# Patient Record
Sex: Female | Born: 1950 | Race: White | Hispanic: No | Marital: Married | Smoking: Never smoker
Health system: Southern US, Community
[De-identification: ages and names within clinical notes are randomized; demographics above are authoritative.]

## PROBLEM LIST (undated history)

## (undated) DIAGNOSIS — Z8719 Personal history of other diseases of the digestive system: Secondary | ICD-10-CM

## (undated) DIAGNOSIS — K219 Gastro-esophageal reflux disease without esophagitis: Secondary | ICD-10-CM

## (undated) DIAGNOSIS — F419 Anxiety disorder, unspecified: Secondary | ICD-10-CM

## (undated) DIAGNOSIS — E785 Hyperlipidemia, unspecified: Secondary | ICD-10-CM

## (undated) DIAGNOSIS — M2550 Pain in unspecified joint: Secondary | ICD-10-CM

## (undated) DIAGNOSIS — N993 Prolapse of vaginal vault after hysterectomy: Secondary | ICD-10-CM

## (undated) DIAGNOSIS — K5900 Constipation, unspecified: Secondary | ICD-10-CM

## (undated) DIAGNOSIS — M8580 Other specified disorders of bone density and structure, unspecified site: Secondary | ICD-10-CM

## (undated) DIAGNOSIS — I471 Supraventricular tachycardia, unspecified: Secondary | ICD-10-CM

## (undated) DIAGNOSIS — M1990 Unspecified osteoarthritis, unspecified site: Secondary | ICD-10-CM

## (undated) DIAGNOSIS — N3281 Overactive bladder: Secondary | ICD-10-CM

## (undated) DIAGNOSIS — E782 Mixed hyperlipidemia: Secondary | ICD-10-CM

## (undated) DIAGNOSIS — N393 Stress incontinence (female) (male): Secondary | ICD-10-CM

## (undated) DIAGNOSIS — K449 Diaphragmatic hernia without obstruction or gangrene: Secondary | ICD-10-CM

## (undated) DIAGNOSIS — I10 Essential (primary) hypertension: Secondary | ICD-10-CM

## (undated) DIAGNOSIS — I219 Acute myocardial infarction, unspecified: Secondary | ICD-10-CM

## (undated) DIAGNOSIS — G8929 Other chronic pain: Secondary | ICD-10-CM

## (undated) DIAGNOSIS — D3132 Benign neoplasm of left choroid: Secondary | ICD-10-CM

## (undated) DIAGNOSIS — K5909 Other constipation: Secondary | ICD-10-CM

## (undated) DIAGNOSIS — Z860100 Personal history of colon polyps, unspecified: Secondary | ICD-10-CM

## (undated) DIAGNOSIS — Z860101 Personal history of adenomatous and serrated colon polyps: Secondary | ICD-10-CM

## (undated) DIAGNOSIS — K760 Fatty (change of) liver, not elsewhere classified: Secondary | ICD-10-CM

## (undated) DIAGNOSIS — I2510 Atherosclerotic heart disease of native coronary artery without angina pectoris: Secondary | ICD-10-CM

## (undated) DIAGNOSIS — C6992 Malignant neoplasm of unspecified site of left eye: Secondary | ICD-10-CM

## (undated) DIAGNOSIS — M549 Dorsalgia, unspecified: Secondary | ICD-10-CM

## (undated) DIAGNOSIS — I499 Cardiac arrhythmia, unspecified: Secondary | ICD-10-CM

## (undated) DIAGNOSIS — M810 Age-related osteoporosis without current pathological fracture: Secondary | ICD-10-CM

## (undated) DIAGNOSIS — R0602 Shortness of breath: Secondary | ICD-10-CM

## (undated) HISTORY — DX: Unspecified osteoarthritis, unspecified site: M19.90

## (undated) HISTORY — DX: Age-related osteoporosis without current pathological fracture: M81.0

## (undated) HISTORY — DX: Fatty (change of) liver, not elsewhere classified: K76.0

## (undated) HISTORY — PX: ANKLE SURGERY: SHX546

## (undated) HISTORY — PX: CATARACT EXTRACTION W/ INTRAOCULAR LENS IMPLANT: SHX1309

## (undated) HISTORY — PX: ABDOMINAL HYSTERECTOMY: SHX81

## (undated) HISTORY — DX: Essential (primary) hypertension: I10

## (undated) HISTORY — PX: BREAST BIOPSY: SHX20

## (undated) HISTORY — PX: CARPAL TUNNEL RELEASE: SHX101

## (undated) HISTORY — DX: Gastro-esophageal reflux disease without esophagitis: K21.9

## (undated) HISTORY — DX: Hyperlipidemia, unspecified: E78.5

## (undated) HISTORY — DX: Anxiety disorder, unspecified: F41.9

## (undated) HISTORY — PX: POLYPECTOMY: SHX149

## (undated) HISTORY — DX: Personal history of colonic polyps: Z86.010

## (undated) HISTORY — PX: CHOLECYSTECTOMY: SHX55

## (undated) HISTORY — DX: Benign neoplasm of left choroid: D31.32

## (undated) HISTORY — DX: Dorsalgia, unspecified: M54.9

## (undated) HISTORY — DX: Malignant neoplasm of unspecified site of left eye: C69.92

## (undated) HISTORY — DX: Shortness of breath: R06.02

## (undated) HISTORY — DX: Pain in unspecified joint: M25.50

## (undated) HISTORY — PX: VARICOSE VEIN SURGERY: SHX832

## (undated) HISTORY — DX: Constipation, unspecified: K59.00

## (undated) HISTORY — DX: Personal history of colon polyps, unspecified: Z86.0100

## (undated) HISTORY — PX: REVERSE SHOULDER ARTHROPLASTY: SHX5054

---

## 1994-10-22 HISTORY — PX: LAPAROSCOPIC CHOLECYSTECTOMY: SUR755

## 1998-04-19 ENCOUNTER — Ambulatory Visit (HOSPITAL_BASED_OUTPATIENT_CLINIC_OR_DEPARTMENT_OTHER): Admission: RE | Admit: 1998-04-19 | Discharge: 1998-04-19 | Payer: Self-pay | Admitting: Orthopedic Surgery

## 1998-05-19 ENCOUNTER — Other Ambulatory Visit: Admission: RE | Admit: 1998-05-19 | Discharge: 1998-05-19 | Payer: Self-pay | Admitting: Obstetrics and Gynecology

## 1998-06-22 ENCOUNTER — Other Ambulatory Visit: Admission: RE | Admit: 1998-06-22 | Discharge: 1998-06-22 | Payer: Self-pay | Admitting: Radiology

## 1998-07-05 ENCOUNTER — Other Ambulatory Visit: Admission: RE | Admit: 1998-07-05 | Discharge: 1998-07-05 | Payer: Self-pay | Admitting: Radiology

## 1998-10-22 HISTORY — PX: ABDOMINAL HYSTERECTOMY: SHX81

## 1999-03-02 ENCOUNTER — Other Ambulatory Visit: Admission: RE | Admit: 1999-03-02 | Discharge: 1999-03-02 | Payer: Self-pay | Admitting: Obstetrics and Gynecology

## 1999-04-19 ENCOUNTER — Encounter: Payer: Self-pay | Admitting: Obstetrics and Gynecology

## 1999-04-19 ENCOUNTER — Encounter (INDEPENDENT_AMBULATORY_CARE_PROVIDER_SITE_OTHER): Payer: Self-pay | Admitting: Specialist

## 1999-04-19 ENCOUNTER — Inpatient Hospital Stay (HOSPITAL_COMMUNITY): Admission: RE | Admit: 1999-04-19 | Discharge: 1999-04-22 | Payer: Self-pay | Admitting: Obstetrics and Gynecology

## 2001-04-01 ENCOUNTER — Ambulatory Visit (HOSPITAL_BASED_OUTPATIENT_CLINIC_OR_DEPARTMENT_OTHER): Admission: RE | Admit: 2001-04-01 | Discharge: 2001-04-01 | Payer: Self-pay | Admitting: Orthopedic Surgery

## 2001-04-01 HISTORY — PX: CARPAL TUNNEL RELEASE: SHX101

## 2001-05-19 ENCOUNTER — Other Ambulatory Visit: Admission: RE | Admit: 2001-05-19 | Discharge: 2001-05-19 | Payer: Self-pay | Admitting: Obstetrics and Gynecology

## 2003-06-14 ENCOUNTER — Other Ambulatory Visit: Admission: RE | Admit: 2003-06-14 | Discharge: 2003-06-14 | Payer: Self-pay | Admitting: Obstetrics and Gynecology

## 2003-07-02 ENCOUNTER — Encounter: Payer: Self-pay | Admitting: Orthopedic Surgery

## 2003-07-02 ENCOUNTER — Encounter: Admission: RE | Admit: 2003-07-02 | Discharge: 2003-07-02 | Payer: Self-pay | Admitting: Orthopedic Surgery

## 2003-07-05 ENCOUNTER — Ambulatory Visit (HOSPITAL_BASED_OUTPATIENT_CLINIC_OR_DEPARTMENT_OTHER): Admission: RE | Admit: 2003-07-05 | Discharge: 2003-07-05 | Payer: Self-pay | Admitting: Orthopedic Surgery

## 2003-07-05 HISTORY — PX: ANKLE SURGERY: SHX546

## 2004-01-31 ENCOUNTER — Ambulatory Visit (HOSPITAL_COMMUNITY): Admission: RE | Admit: 2004-01-31 | Discharge: 2004-01-31 | Payer: Self-pay | Admitting: *Deleted

## 2004-01-31 ENCOUNTER — Encounter (INDEPENDENT_AMBULATORY_CARE_PROVIDER_SITE_OTHER): Payer: Self-pay | Admitting: Specialist

## 2004-07-17 ENCOUNTER — Encounter (INDEPENDENT_AMBULATORY_CARE_PROVIDER_SITE_OTHER): Payer: Self-pay | Admitting: *Deleted

## 2004-07-17 ENCOUNTER — Ambulatory Visit (HOSPITAL_COMMUNITY): Admission: RE | Admit: 2004-07-17 | Discharge: 2004-07-17 | Payer: Self-pay | Admitting: *Deleted

## 2005-01-04 ENCOUNTER — Ambulatory Visit: Payer: Self-pay | Admitting: Cardiovascular Disease

## 2005-01-30 ENCOUNTER — Ambulatory Visit: Payer: Self-pay

## 2005-02-06 ENCOUNTER — Ambulatory Visit: Payer: Self-pay | Admitting: Pulmonary Disease

## 2005-02-20 ENCOUNTER — Ambulatory Visit: Payer: Self-pay | Admitting: Cardiovascular Disease

## 2005-03-06 ENCOUNTER — Ambulatory Visit: Payer: Self-pay | Admitting: Cardiovascular Disease

## 2005-03-14 ENCOUNTER — Ambulatory Visit: Payer: Self-pay | Admitting: Cardiovascular Disease

## 2005-09-20 ENCOUNTER — Ambulatory Visit: Payer: Self-pay | Admitting: Cardiovascular Disease

## 2006-03-22 ENCOUNTER — Ambulatory Visit: Payer: Self-pay | Admitting: Cardiovascular Disease

## 2006-08-12 ENCOUNTER — Ambulatory Visit (HOSPITAL_COMMUNITY): Admission: RE | Admit: 2006-08-12 | Discharge: 2006-08-12 | Payer: Self-pay | Admitting: *Deleted

## 2007-03-24 ENCOUNTER — Ambulatory Visit: Payer: Self-pay | Admitting: Cardiovascular Disease

## 2008-03-29 ENCOUNTER — Ambulatory Visit: Payer: Self-pay | Admitting: Cardiovascular Disease

## 2008-09-27 ENCOUNTER — Encounter: Payer: Self-pay | Admitting: Family Medicine

## 2008-11-24 ENCOUNTER — Ambulatory Visit: Payer: Self-pay | Admitting: Family Medicine

## 2008-11-24 DIAGNOSIS — J309 Allergic rhinitis, unspecified: Secondary | ICD-10-CM | POA: Insufficient documentation

## 2008-11-24 DIAGNOSIS — R011 Cardiac murmur, unspecified: Secondary | ICD-10-CM | POA: Insufficient documentation

## 2008-11-24 DIAGNOSIS — M1990 Unspecified osteoarthritis, unspecified site: Secondary | ICD-10-CM | POA: Insufficient documentation

## 2008-11-24 DIAGNOSIS — E785 Hyperlipidemia, unspecified: Secondary | ICD-10-CM | POA: Insufficient documentation

## 2008-11-24 DIAGNOSIS — J45909 Unspecified asthma, uncomplicated: Secondary | ICD-10-CM | POA: Insufficient documentation

## 2008-11-24 DIAGNOSIS — I10 Essential (primary) hypertension: Secondary | ICD-10-CM | POA: Insufficient documentation

## 2008-12-17 ENCOUNTER — Encounter: Payer: Self-pay | Admitting: Family Medicine

## 2009-01-12 ENCOUNTER — Ambulatory Visit: Payer: Self-pay | Admitting: Family Medicine

## 2009-01-12 LAB — CONVERTED CEMR LAB
Bilirubin Urine: NEGATIVE
Glucose, Urine, Semiquant: NEGATIVE
Ketones, urine, test strip: NEGATIVE
Nitrite: NEGATIVE
Protein, U semiquant: NEGATIVE
Specific Gravity, Urine: 1.01
Urobilinogen, UA: 0.2
WBC Urine, dipstick: NEGATIVE
pH: 7

## 2009-01-14 LAB — CONVERTED CEMR LAB
ALT: 20 units/L (ref 0–35)
AST: 22 units/L (ref 0–37)
Albumin: 4.1 g/dL (ref 3.5–5.2)
Alkaline Phosphatase: 43 units/L (ref 39–117)
BUN: 11 mg/dL (ref 6–23)
Basophils Absolute: 0 10*3/uL (ref 0.0–0.1)
Basophils Relative: 1.1 % (ref 0.0–3.0)
Bilirubin, Direct: 0 mg/dL (ref 0.0–0.3)
CO2: 30 meq/L (ref 19–32)
Calcium: 9.4 mg/dL (ref 8.4–10.5)
Chloride: 103 meq/L (ref 96–112)
Cholesterol: 177 mg/dL (ref 0–200)
Creatinine, Ser: 0.6 mg/dL (ref 0.4–1.2)
Eosinophils Absolute: 0.2 10*3/uL (ref 0.0–0.7)
Eosinophils Relative: 4.4 % (ref 0.0–5.0)
GFR calc non Af Amer: 108.87 mL/min (ref >60)
Glucose, Bld: 91 mg/dL (ref 70–99)
HCT: 36.7 % (ref 36.0–46.0)
HDL: 28.8 mg/dL — ABNORMAL LOW (ref >39.00)
Hemoglobin: 13 g/dL (ref 12.0–15.0)
LDL Cholesterol: 120 mg/dL — ABNORMAL HIGH (ref 0–99)
Lymphocytes Relative: 37.6 % (ref 12.0–46.0)
Lymphs Abs: 1.6 10*3/uL (ref 0.7–4.0)
MCHC: 35.3 g/dL (ref 30.0–36.0)
MCV: 86.8 fL (ref 78.0–100.0)
Monocytes Absolute: 0.3 10*3/uL (ref 0.1–1.0)
Monocytes Relative: 7.2 % (ref 3.0–12.0)
Neutro Abs: 2.2 10*3/uL (ref 1.4–7.7)
Neutrophils Relative %: 49.7 % (ref 43.0–77.0)
Platelets: 194 10*3/uL (ref 150.0–400.0)
Potassium: 3.9 meq/L (ref 3.5–5.1)
RBC: 4.23 M/uL (ref 3.87–5.11)
RDW: 12.7 % (ref 11.5–14.6)
Sodium: 139 meq/L (ref 135–145)
TSH: 1.09 microintl units/mL (ref 0.35–5.50)
Total Bilirubin: 0.8 mg/dL (ref 0.3–1.2)
Total CHOL/HDL Ratio: 6
Total Protein: 7.7 g/dL (ref 6.0–8.3)
Triglycerides: 143 mg/dL (ref 0.0–149.0)
VLDL: 28.6 mg/dL (ref 0.0–40.0)
WBC: 4.3 10*3/uL — ABNORMAL LOW (ref 4.5–10.5)

## 2009-01-19 ENCOUNTER — Encounter: Payer: Self-pay | Admitting: Cardiovascular Disease

## 2009-01-19 ENCOUNTER — Ambulatory Visit: Payer: Self-pay | Admitting: Family Medicine

## 2009-03-18 DIAGNOSIS — D126 Benign neoplasm of colon, unspecified: Secondary | ICD-10-CM | POA: Insufficient documentation

## 2009-03-18 DIAGNOSIS — B019 Varicella without complication: Secondary | ICD-10-CM | POA: Insufficient documentation

## 2009-03-18 DIAGNOSIS — I872 Venous insufficiency (chronic) (peripheral): Secondary | ICD-10-CM | POA: Insufficient documentation

## 2009-03-28 ENCOUNTER — Ambulatory Visit: Payer: Self-pay | Admitting: Cardiovascular Disease

## 2009-08-05 ENCOUNTER — Ambulatory Visit: Payer: Self-pay | Admitting: Family Medicine

## 2009-08-05 DIAGNOSIS — M545 Low back pain, unspecified: Secondary | ICD-10-CM | POA: Insufficient documentation

## 2009-08-19 ENCOUNTER — Encounter: Payer: Self-pay | Admitting: Family Medicine

## 2009-10-17 ENCOUNTER — Encounter: Payer: Self-pay | Admitting: Family Medicine

## 2009-11-04 ENCOUNTER — Encounter: Payer: Self-pay | Admitting: Family Medicine

## 2009-12-16 ENCOUNTER — Ambulatory Visit: Payer: Self-pay | Admitting: Family Medicine

## 2009-12-16 DIAGNOSIS — J019 Acute sinusitis, unspecified: Secondary | ICD-10-CM | POA: Insufficient documentation

## 2009-12-28 ENCOUNTER — Encounter: Payer: Self-pay | Admitting: Family Medicine

## 2010-01-17 ENCOUNTER — Ambulatory Visit: Payer: Self-pay | Admitting: Family Medicine

## 2010-01-17 LAB — CONVERTED CEMR LAB
Bilirubin Urine: NEGATIVE
Glucose, Urine, Semiquant: NEGATIVE
Ketones, urine, test strip: NEGATIVE
Nitrite: NEGATIVE
Protein, U semiquant: NEGATIVE
Specific Gravity, Urine: 1.01
Urobilinogen, UA: 0.2
WBC Urine, dipstick: NEGATIVE
pH: 7

## 2010-01-18 LAB — CONVERTED CEMR LAB
ALT: 24 units/L (ref 0–35)
AST: 22 units/L (ref 0–37)
Albumin: 4 g/dL (ref 3.5–5.2)
Alkaline Phosphatase: 41 units/L (ref 39–117)
BUN: 15 mg/dL (ref 6–23)
Basophils Absolute: 0 10*3/uL (ref 0.0–0.1)
Basophils Relative: 0.1 % (ref 0.0–3.0)
Bilirubin, Direct: 0 mg/dL (ref 0.0–0.3)
CO2: 31 meq/L (ref 19–32)
Calcium: 9.2 mg/dL (ref 8.4–10.5)
Chloride: 101 meq/L (ref 96–112)
Cholesterol: 169 mg/dL (ref 0–200)
Creatinine, Ser: 0.7 mg/dL (ref 0.4–1.2)
Eosinophils Absolute: 0.2 10*3/uL (ref 0.0–0.7)
Eosinophils Relative: 5 % (ref 0.0–5.0)
GFR calc non Af Amer: 90.81 mL/min (ref >60)
Glucose, Bld: 86 mg/dL (ref 70–99)
HCT: 37.8 % (ref 36.0–46.0)
HDL: 32.6 mg/dL — ABNORMAL LOW (ref >39.00)
Hemoglobin: 12.7 g/dL (ref 12.0–15.0)
LDL Cholesterol: 103 mg/dL — ABNORMAL HIGH (ref 0–99)
Lymphocytes Relative: 34.2 % (ref 12.0–46.0)
Lymphs Abs: 1.3 10*3/uL (ref 0.7–4.0)
MCHC: 33.7 g/dL (ref 30.0–36.0)
MCV: 89.9 fL (ref 78.0–100.0)
Monocytes Absolute: 0.3 10*3/uL (ref 0.1–1.0)
Monocytes Relative: 8.5 % (ref 3.0–12.0)
Neutro Abs: 2.1 10*3/uL (ref 1.4–7.7)
Neutrophils Relative %: 52.2 % (ref 43.0–77.0)
Platelets: 164 10*3/uL (ref 150.0–400.0)
Potassium: 4 meq/L (ref 3.5–5.1)
RBC: 4.2 M/uL (ref 3.87–5.11)
RDW: 12.1 % (ref 11.5–14.6)
Sodium: 138 meq/L (ref 135–145)
TSH: 2 microintl units/mL (ref 0.35–5.50)
Total Bilirubin: 0.7 mg/dL (ref 0.3–1.2)
Total CHOL/HDL Ratio: 5
Total Protein: 7.6 g/dL (ref 6.0–8.3)
Triglycerides: 168 mg/dL — ABNORMAL HIGH (ref 0.0–149.0)
VLDL: 33.6 mg/dL (ref 0.0–40.0)
WBC: 3.9 10*3/uL — ABNORMAL LOW (ref 4.5–10.5)

## 2010-01-20 ENCOUNTER — Ambulatory Visit: Payer: Self-pay | Admitting: Family Medicine

## 2010-04-12 ENCOUNTER — Ambulatory Visit: Payer: Self-pay | Admitting: Family Medicine

## 2010-07-19 ENCOUNTER — Ambulatory Visit: Payer: Self-pay | Admitting: Family Medicine

## 2010-07-25 ENCOUNTER — Ambulatory Visit: Payer: Self-pay | Admitting: Family Medicine

## 2010-11-21 NOTE — Assessment & Plan Note (Signed)
Summary: shingles shot   Nurse Visit   Allergies: 1)  ! Codeine  Immunizations Administered:  Zostavax # 1:    Vaccine Type: Zostavax    Site: right deltoid    Mfr: Merck    Dose: 0.5 ml    Route: [place]    Given by: Raechel Ache, RN    Exp. Date: 05/17/2011    Lot #: [id number]    VIS given: 08/03/05 given April 12, 2010.  Orders Added: 1)  Zoster (Shingles) Vaccine Live [90736] 2)  Admin 1st Vaccine [phone]

## 2010-11-21 NOTE — Miscellaneous (Signed)
Summary: Waiver of Liability for Shingles Vaccine  Waiver of Liability for Shingles Vaccine   Imported By: Maryln Gottron 04/13/2010 11:16:25  _____________________________________________________________________  External Attachment:    Type:   Image     Comment:   External Document

## 2010-11-21 NOTE — Assessment & Plan Note (Signed)
Summary: SINUSITIS? // RS   Vital Signs:  Patient profile:   60 year old female Weight:      211 pounds O2 Sat:      97 % Temp:     98.8 degrees F Pulse rate:   76 / minute BP sitting:   140 / 84  (left arm) Cuff size:   large  Vitals Entered By: Pura Spice, RN (July 25, 2010 11:43 AM) CC: sinus infection since last week   History of Present Illness: Here for one week of sinus pressure, HA, PND, and low fevers. No ST or cough.   Allergies: 1)  ! Codeine  Past History:  Past Medical History: Reviewed history from 01/20/2010 and no changes required. 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 Floyde Parkins for GYN exams normal DEXA on 12-28-09  Past Surgical History: Reviewed history from 08/05/2009 and no changes required. Breast Biopsy Carpal tunnel release Cholecystectomy Hysterectomy laser ablation of leg varicosities per Dr. Ardyth Gal colonoscopy 10-07 per Dr. Virginia Rochester, repeat in 5 yrs has had Synvisc injections to the knees per Dr. Lequita Halt 2008  Review of Systems  The patient denies anorexia, weight loss, weight gain, vision loss, decreased hearing, hoarseness, chest pain, syncope, dyspnea on exertion, peripheral edema, prolonged cough, hemoptysis, abdominal pain, melena, hematochezia, severe indigestion/heartburn, hematuria, incontinence, genital sores, muscle weakness, suspicious skin lesions, transient blindness, difficulty walking, depression, unusual weight change, abnormal bleeding, enlarged lymph nodes, angioedema, breast masses, and testicular masses.    Physical Exam  General:  Well-developed,well-nourished,in no acute distress; alert,appropriate and cooperative throughout examination Head:   Normocephalic and atraumatic without obvious abnormalities. No apparent alopecia or balding. Eyes:  No corneal or conjunctival inflammation noted. EOMI. Perrla. Funduscopic exam benign, without hemorrhages, exudates or papilledema. Vision grossly normal. Ears:  External ear exam shows no significant lesions or deformities.  Otoscopic examination reveals clear canals, tympanic membranes are intact bilaterally without bulging, retraction, inflammation or discharge. Hearing is grossly normal bilaterally. Nose:  External nasal examination shows no deformity or inflammation. Nasal mucosa are pink and moist without lesions or exudates. Mouth:  Oral mucosa and oropharynx without lesions or exudates.  Teeth in good repair. Neck:  No deformities, masses, or tenderness noted. Lungs:  Normal respiratory effort, chest expands symmetrically. Lungs are clear to auscultation, no crackles or wheezes.   Impression & Recommendations:  Problem # 1:  ACUTE SINUSITIS, UNSPECIFIED (ICD-461.9)  Her updated medication list for this problem includes:    Nasacort Aq 55 Mcg/act Aers (Triamcinolone acetonide(nasal)) .Marland Kitchen... 2 sprays each nostril once daily    Augmentin 875-125 Mg Tabs (Amoxicillin-pot clavulanate) .Marland Kitchen..Marland Kitchen Two times a day  Orders: Depo- Medrol 80mg  (J1040) Admin of Therapeutic Inj  intramuscular or subcutaneous (47829) Depo- Medrol 40mg  (J1030)  Complete Medication List: 1)  Niaspan 500 Mg Tbcr (Niacin (antihyperlipidemic)) .... Once daily 2)  Cardizem 120 Mg Tabs (Diltiazem hcl) .... Once daily 3)  Vitamin C 500 Mg Tabs (Ascorbic acid) .Marland Kitchen.. 1 by mouth once daily 4)  Vitamin E 600 Unit Caps (Vitamin e) .Marland Kitchen.. 1 by mouth once daily 5)  Multivitamins Tabs (Multiple vitamin) .... Once daily 6)  Calcium 1500 Mg Tabs (Calcium carbonate) .... Once daily 7)  Glucosamine-msm 500-400 Mg Caps (Glucosamine sulfate-msm) .... Once daily 8)  Crestor 10 Mg Tabs (Rosuvastatin calcium) .... Once daily 9)  Altace 5  Mg  Caps (Ramipril) .... Once daily 10)  Nasacort Aq 55 Mcg/act Aers (Triamcinolone acetonide(nasal)) .... 2 sprays each nostril once daily 11)  Fish Oil Oil (Fish oil) .... 2 by mouth once daily 12)  Vicodin 5-500 Mg Tabs (Hydrocodone-acetaminophen) .Marland Kitchen.. 1 q 4 hours as needed pain 13)  Zyrtec Allergy 10 Mg Caps (Cetirizine hcl) .... One by mouth daily 14)  Ventolin Hfa 108 (90 Base) Mcg/act Aers (Albuterol sulfate) .... 2 puffs q 4 hours as needed 15)  Augmentin 875-125 Mg Tabs (Amoxicillin-pot clavulanate) .... Two times a day  Patient Instructions: 1)  Please schedule a follow-up appointment as needed .  Prescriptions: AUGMENTIN 875-125 MG TABS (AMOXICILLIN-POT CLAVULANATE) two times a day  #20 x 0   Entered and Authorized by:   Nelwyn Salisbury MD   Signed by:   Nelwyn Salisbury MD on 07/25/2010   Method used:   [facility] to        [facility]  [address] [phone]* (retail)       [address] [place], [place]  [address]       Ph: [phone] or [phone]       Fax: [phone]   RxID:   [id number]    Medication Administration  Injection # 1:    Medication: Depo- Medrol 80mg     Diagnosis: ACUTE SINUSITIS, UNSPECIFIED (ICD-461.9)    Route: IM    Site: RUOQ gluteus    Exp Date: 01/2013    Lot #: [id number]    Mfr: [employer]    Comments: 01/2013    Patient tolerated injection without complications    Given by: Pura Spice, RN (July 25, 2010 12:25 PM)  Injection # 2:    Medication: Depo- Medrol 40mg     Diagnosis: ACUTE SINUSITIS, UNSPECIFIED (ICD-461.9)    Route: IM    Site: RUOQ gluteus    Exp Date: 01/2013    Lot #: [id number]    Given by: Pura Spice, RN (July 25, 2010 12:26 PM)  Orders Added: 1)  Est. Patient Level IV [97673] 2)  Depo- Medrol 80mg  [J1040] 3)  Admin of Therapeutic Inj  intramuscular or subcutaneous [96372] 4)  Depo- Medrol 40mg  [J1030]

## 2010-11-21 NOTE — Assessment & Plan Note (Signed)
Summary: flu shot//ccm  Nurse Visit   Review of Systems       Flu Vaccine Consent Questions     Do you have a history of severe allergic reactions to this vaccine? no    Any prior history of allergic reactions to egg and/or gelatin? no    Do you have a sensitivity to the preservative Thimersol? no    Do you have a past history of Guillan-Barre Syndrome? no    Do you currently have an acute febrile illness? no    Have you ever had a severe reaction to latex? no    Vaccine information given and explained to patient? yes    Are you currently pregnant? no    Lot Number:[id number]   Exp Date:04/21/2011   Site Given  Left Deltoid IM    Allergies: 1)  ! Codeine  Orders Added: 1)  Admin 1st Vaccine [90471] 2)  Flu Vaccine 27yrs + [16109]

## 2010-11-21 NOTE — Assessment & Plan Note (Signed)
Summary: cpx/njr   Vital Signs:  Patient profile:   60 year old female Height:      64.5 inches Weight:      198 pounds BMI:     33.58 O2 Sat:      92 % Temp:     98.4 degrees F oral Pulse rate:   63 / minute Pulse rhythm:   regular Resp:     12 per minute BP sitting:   128 / 72  Vitals Entered By: Lynann Beaver CMA (January 20, 2010 9:05 AM) CC: cpx Is Patient Diabetic? No Pain Assessment Patient in pain? no        History of Present Illness: 60 yr old female for a cpx. She feels fine and has no complaints. She asks about a shingles shot.   Allergies: 1)  ! 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 Floyde Parkins for GYN exams normal DEXA on 12-28-09  Past Surgical History: Reviewed history from 08/05/2009 and no changes required. Breast Biopsy Carpal tunnel release Cholecystectomy Hysterectomy laser ablation of leg varicosities per Dr. Ardyth Gal colonoscopy 10-07 per Dr. Virginia Rochester, repeat in 5 yrs has had Synvisc injections to the knees per Dr. Lequita Halt 2008  Family History: Reviewed history from 11/24/2008 and no changes required. Family History Breast cancer 1st degree relative <50 Family History of CAD Female 1st degree relative <50 Family History of Colon CA 1st degree relative <60 Family History Diabetes 1st degree relative Family History High cholesterol Family History Hypertension Family History Kidney disease Family History Lung cancer  Social History: Reviewed history from 11/24/2008 and no changes required. Married Never Smoked Alcohol use-no Drug use-no Regular exercise-no  Review of Systems  The patient denies anorexia, fever, weight loss,  weight gain, vision loss, decreased hearing, hoarseness, chest pain, syncope, dyspnea on exertion, peripheral edema, prolonged cough, headaches, hemoptysis, abdominal pain, melena, hematochezia, severe indigestion/heartburn, hematuria, incontinence, genital sores, muscle weakness, suspicious skin lesions, transient blindness, difficulty walking, depression, unusual weight change, abnormal bleeding, enlarged lymph nodes, angioedema, breast masses, and testicular masses.    Physical Exam  General:  Well-developed,well-nourished,in no acute distress; alert,appropriate and cooperative throughout examination Head:  Normocephalic and atraumatic without obvious abnormalities. No apparent alopecia or balding. Eyes:  No corneal or conjunctival inflammation noted. EOMI. Perrla. Funduscopic exam benign, without hemorrhages, exudates or papilledema. Vision grossly normal. Ears:  External ear exam shows no significant lesions or deformities.  Otoscopic examination reveals clear canals, tympanic membranes are intact bilaterally without bulging, retraction, inflammation or discharge. Hearing is grossly normal bilaterally. Nose:  External nasal examination shows no deformity or inflammation. Nasal mucosa are pink and moist without lesions or exudates. Mouth:  Oral mucosa and oropharynx without lesions or exudates.  Teeth in good repair. Neck:  No deformities, masses, or tenderness noted. Chest [place]:  No deformities, masses, or tenderness noted. Lungs:  Normal respiratory effort, chest expands symmetrically. Lungs are clear to auscultation, no crackles or wheezes. Heart:  Normal rate and regular rhythm. S1 and S2 normal without gallop, murmur, click, rub or other extra sounds. EKG normal Abdomen:  Bowel sounds positive,abdomen soft and non-tender without masses, organomegaly or hernias noted. Msk:  No deformity or scoliosis noted of thoracic or lumbar spine.   Pulses:  R and L carotid,radial,femoral,dorsalis pedis  and posterior tibial pulses  are full and equal bilaterally Extremities:  No clubbing, cyanosis, edema, or deformity noted with normal full range of motion of all joints.   Neurologic:  No cranial nerve deficits noted. Station and gait are normal. Plantar reflexes are down-going bilaterally. DTRs are symmetrical throughout. Sensory, motor and coordinative functions appear intact. Skin:  Intact without suspicious lesions or rashes Cervical Nodes:  No lymphadenopathy noted Axillary Nodes:  No palpable lymphadenopathy Inguinal Nodes:  No significant adenopathy Psych:  Cognition and judgment appear intact. Alert and cooperative with normal attention span and concentration. No apparent delusions, illusions, hallucinations   Impression & Recommendations:  Problem # 1:  WELL ADULT EXAM (ICD-V70.0)  Orders: EKG w/ Interpretation (93000)  Complete Medication List: 1)  Niaspan 500 Mg Tbcr (Niacin (antihyperlipidemic)) .... Once daily 2)  Cardizem 120 Mg Tabs (Diltiazem hcl) .... Once daily 3)  Vitamin C 500 Mg Tabs (Ascorbic acid) .Marland Kitchen.. 1 by mouth once daily 4)  Vitamin E 600 Unit Caps (Vitamin e) .Marland Kitchen.. 1 by mouth once daily 5)  Multivitamins Tabs (Multiple vitamin) .... Once daily 6)  Calcium 1500 Mg Tabs (Calcium carbonate) .... Once daily 7)  Glucosamine-msm 500-400 Mg Caps (Glucosamine sulfate-msm) .... Once daily 8)  Crestor 10 Mg Tabs (Rosuvastatin calcium) .... Once daily 9)  Altace 5 Mg Caps (Ramipril) .... Once daily 10)  Nasacort Aq 55 Mcg/act Aers (Triamcinolone acetonide(nasal)) .... 2 sprays each nostril once daily 11)  Fish Oil Oil (Fish oil) .... 2 by mouth once daily 12)  Vicodin 5-500 Mg Tabs (Hydrocodone-acetaminophen) .Marland Kitchen.. 1 q 4 hours as needed pain 13)  Zyrtec Allergy 10 Mg Caps (Cetirizine hcl) .... One by mouth daily 14)  Ventolin Hfa 108 (90 Base) Mcg/act Aers (Albuterol sulfate) .... 2 puffs q 4 hours as needed  Patient Instructions: 1)  Please schedule a follow-up  appointment in 6 months .  2)  It is important that you exercise reguarly at least 20 minutes 5 times a week. If you develop chest pain, have severe difficulty breathing, or feel very tired, stop exercising immediately and seek medical attention.  3)  You need to lose weight. Consider a lower calorie diet and regular exercise.  Prescriptions: NASACORT AQ 55 MCG/ACT AERS (TRIAMCINOLONE ACETONIDE(NASAL)) 2 sprays each nostril once daily  #30 x 11   Entered and Authorized by:   Nelwyn Salisbury MD   Signed by:   Nelwyn Salisbury MD on 01/20/2010   Method used:   [facility] to        [facility]  [address] [phone][facility] ([facility]ail)       [address]       [place], [place]  [address]       Ph: [phone] or [phone]       Fax: [phone]   RxID:   [id number] ALTACE 5 MG CAPS (RAMIPRIL) once daily  #30 x 11   Entered and Authorized by:   Nelwyn Salisbury MD   Signed by:   Nelwyn Salisbury MD on 01/20/2010   Method used:   [facility]        [facility]  [address] [phone]* (retail)       [address] [place], [place]  [address]       Ph: [phone] or [phone]       Fax: [phone]   RxID:   [id number] CRESTOR 10  MG TABS (ROSUVASTATIN CALCIUM) once daily  #30 x 11   Entered and Authorized by:   Nelwyn Salisbury MD   Signed by:   Nelwyn Salisbury MD on 01/20/2010   Method used:   [facility]        [facility]  [address] [phone]* (retail)       [address]       [place], [place]  [address]       Ph: [phone] or [phone]       Fax: [phone]   RxID:   [id number] VENTOLIN HFA 108 (90 BASE) MCG/ACT AERS (ALBUTEROL SULFATE) 2 puffs q 4 hours as needed  #1 x 11   Entered and Authorized by:   Nelwyn Salisbury MD   Signed by:   Nelwyn Salisbury MD on 01/20/2010   Method used:   [facility]        [facility]  [address] [phone][facility] ([facility])       [address]       [place], [place]  [address]       Ph: [phone] or [phone]       Fax: [phone]    RxID:   [id number] CARDIZEM 120 MG TABS (DILTIAZEM HCL) once daily  #30 x 11   Entered and Authorized by:   Nelwyn Salisbury MD   Signed by:   Nelwyn Salisbury MD on 01/20/2010   Method used:   [facility]        [facility]  [address] [phone]* (retail)       [address] [place], [place]  [address]       Ph: [phone] or [phone]       Fax: [phone]   RxID:   [id number] NIASPAN 500 MG TBCR (NIACIN (ANTIHYPERLIPIDEMIC)) once daily  #30 x 11   Entered and Authorized by:   Nelwyn Salisbury MD   Signed by:   Nelwyn Salisbury MD on 01/20/2010   Method used:   [facility]        [facility]  [address] [phone]* (retail)       [address] [place], [place]  [address]       Ph: [phone] or [phone]       Fax: [phone]   RxID:   [id number]

## 2010-11-21 NOTE — Assessment & Plan Note (Signed)
Summary: UPPER RESP PROBLEMS // RS   Vital Signs:  Patient profile:   60 year old female Weight:      197 pounds BMI:     32.90 Temp:     98.6 degrees F oral BP sitting:   132 / 74  (left arm) Cuff size:   large  Vitals Entered By: Alfred Levins, CMA (December 16, 2009 3:25 PM) CC: head congestion, drainage, bilateral earache, st x2 days   History of Present Illness: Here for one week of sinus pressure, HA, PND, ST, and a dry cough. No fever.   Allergies: 1)  ! Codeine  Past History:  Past Medical History: Reviewed history from 03/18/2009 and no changes required. 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) HEART MURMUR, HX OF (ICD-V12.50) COLONIC POLYPS (ICD-211.3) CHICKENPOX (ICD-052.9) WELL ADULT EXAM (ICD-V70.0) FAMILY HISTORY DIABETES 1ST DEGREE RELATIVE (ICD-V18.0) FAMILY HISTORY OF COLON CA 1ST DEGREE RELATIVE <60 (ICD-V16.0) FAMILY HISTORY OF CAD FEMALE 1ST DEGREE RELATIVE <50 (ICD-V17.3) FAMILY HISTORY BREAST CANCER 1ST DEGREE RELATIVE <50 (ICD-V16.3) CARDIAC MURMUR (ICD-785.2) OSTEOARTHRITIS (ICD-715.90) HYPERTENSION (ICD-401.9) HYPERLIPIDEMIA (ICD-272.4) COLONIC POLYPS, HX OF (ICD-V12.72) ASTHMA (ICD-493.90) ALLERGIC RHINITIS (ICD-477.9)  Review of Systems  The patient denies anorexia, fever, weight loss, weight gain, vision loss, decreased hearing, hoarseness, chest pain, syncope, dyspnea on exertion, peripheral edema, hemoptysis, abdominal pain, melena, hematochezia, severe indigestion/heartburn, hematuria, incontinence, genital sores, muscle weakness, suspicious skin lesions, transient blindness, difficulty walking, depression, unusual weight change, abnormal bleeding, enlarged lymph nodes, angioedema, breast masses, and testicular masses.    Physical Exam  General:  Well-developed,well-nourished,in no acute distress; alert,appropriate and cooperative throughout examination Head:  Normocephalic and atraumatic without obvious abnormalities.  No apparent alopecia or balding. Eyes:  No corneal or conjunctival inflammation noted. EOMI. Perrla. Funduscopic exam benign, without hemorrhages, exudates or papilledema. Vision grossly normal. Ears:  External ear exam shows no significant lesions or deformities.  Otoscopic examination reveals clear canals, tympanic membranes are intact bilaterally without bulging, retraction, inflammation or discharge. Hearing is grossly normal bilaterally. Nose:  External nasal examination shows no deformity or inflammation. Nasal mucosa are pink and moist without lesions or exudates. Mouth:  Oral mucosa and oropharynx without lesions or exudates.  Teeth in good repair. Neck:  No deformities, masses, or tenderness noted. Lungs:  Normal respiratory effort, chest expands symmetrically. Lungs are clear to auscultation, no crackles or wheezes.   Impression & Recommendations:  Problem # 1:  ACUTE SINUSITIS, UNSPECIFIED (ICD-461.9)  Her updated medication list for this problem includes:    Nasacort Aq 55 Mcg/act Aers (Triamcinolone acetonide(nasal)) .Marland Kitchen... 2 sprays each nostril once daily    Augmentin 875-125 Mg Tabs (Amoxicillin-pot clavulanate) .Marland Kitchen..Marland Kitchen Two times a day  Complete Medication List: 1)  Niaspan 500 Mg Tbcr (Niacin (antihyperlipidemic)) .... Once daily 2)  Cardizem 120 Mg Tabs (Diltiazem hcl) .Marland Kitchen.. 1 tab po once daily as needed 3)  Proair Hfa 108 (90 Base) Mcg/act Aers (Albuterol sulfate) .... 2 puffs every 4 hours as needed for sob 4)  Vitamin C 500 Mg Tabs (Ascorbic acid) .Marland Kitchen.. 1 by mouth once daily 5)  Vitamin E 600 Unit Caps (Vitamin e) .Marland Kitchen.. 1 by mouth once daily 6)  Multivitamins Tabs (Multiple vitamin) .... Once daily 7)  Calcium 1500 Mg Tabs (Calcium carbonate) .... Once daily 8)  Glucosamine-msm 500-400 Mg Caps (Glucosamine sulfate-msm) .... Once daily 9)  Crestor 10 Mg Tabs (Rosuvastatin calcium) .... Once daily 10)  Altace 5 Mg Caps (Ramipril) .... Once daily 11)  Nasacort Aq 55 Mcg/act  Aers (Triamcinolone acetonide(nasal)) .... 2 sprays each nostril once daily 12)  Fish Oil Oil (Fish oil) .... 2 by mouth once daily 13)  Vicodin 5-500 Mg Tabs (Hydrocodone-acetaminophen) .Marland Kitchen.. 1 q 4 hours as needed pain 14)  Augmentin 875-125 Mg Tabs (Amoxicillin-pot clavulanate) .... Two times a day  Patient Instructions: 1)  Please schedule a follow-up appointment as needed .  Prescriptions: AUGMENTIN 875-125 MG TABS (AMOXICILLIN-POT CLAVULANATE) two times a day  #20 x 0   Entered and Authorized by:   Nelwyn Salisbury MD   Signed by:   Nelwyn Salisbury MD on 12/16/2009   Method used:   [facility] to        [facility]  [address] [phone][facility] (retail)       [address]       [place], [place]  [address]       Ph: [phone] or [phone]       Fax: [phone]   RxID:   [id number]

## 2011-01-05 ENCOUNTER — Encounter: Payer: Self-pay | Admitting: Family Medicine

## 2011-01-15 ENCOUNTER — Other Ambulatory Visit (INDEPENDENT_AMBULATORY_CARE_PROVIDER_SITE_OTHER): Payer: 59 | Admitting: Family Medicine

## 2011-01-15 DIAGNOSIS — E785 Hyperlipidemia, unspecified: Secondary | ICD-10-CM

## 2011-01-15 DIAGNOSIS — Z0000 Encounter for general adult medical examination without abnormal findings: Secondary | ICD-10-CM

## 2011-01-15 LAB — POCT URINALYSIS DIPSTICK
Ketones, UA: NEGATIVE
Leukocytes, UA: NEGATIVE
Protein, UA: NEGATIVE
Spec Grav, UA: 1.01
pH, UA: 7.5

## 2011-01-15 LAB — LIPID PANEL
HDL: 34 mg/dL — ABNORMAL LOW (ref >39.00)
Triglycerides: 210 mg/dL — ABNORMAL HIGH (ref 0.0–149.0)
VLDL: 42 mg/dL — ABNORMAL HIGH (ref 0.0–40.0)

## 2011-01-15 LAB — BASIC METABOLIC PANEL
CO2: 28 mEq/L (ref 19–32)
Calcium: 9.3 mg/dL (ref 8.4–10.5)
Creatinine, Ser: 0.6 mg/dL (ref 0.4–1.2)
Glucose, Bld: 86 mg/dL (ref 70–99)

## 2011-01-15 LAB — HEPATIC FUNCTION PANEL
Albumin: 4 g/dL (ref 3.5–5.2)
Alkaline Phosphatase: 42 U/L (ref 39–117)

## 2011-01-15 LAB — CBC WITH DIFFERENTIAL/PLATELET
Basophils Absolute: 0 10*3/uL (ref 0.0–0.1)
Eosinophils Absolute: 0.1 10*3/uL (ref 0.0–0.7)
Hemoglobin: 13.4 g/dL (ref 12.0–15.0)
Lymphocytes Relative: 36.8 % (ref 12.0–46.0)
MCHC: 34.7 g/dL (ref 30.0–36.0)
Neutro Abs: 2.3 10*3/uL (ref 1.4–7.7)
Neutrophils Relative %: 51.9 % (ref 43.0–77.0)
RDW: 13.2 % (ref 11.5–14.6)

## 2011-01-16 ENCOUNTER — Telehealth: Payer: Self-pay

## 2011-01-16 NOTE — Telephone Encounter (Signed)
Pt aware  Stated has appt next week  With dr fry

## 2011-01-16 NOTE — Telephone Encounter (Signed)
Message copied by Madison Hickman on Tue Jan 16, 2011 11:23 AM ------      Message from: Dwaine Deter      Created: Tue Jan 16, 2011  8:34 AM       Normal except high chol and TG. Watch the diet

## 2011-01-22 ENCOUNTER — Encounter: Payer: Self-pay | Admitting: Family Medicine

## 2011-01-22 ENCOUNTER — Ambulatory Visit (INDEPENDENT_AMBULATORY_CARE_PROVIDER_SITE_OTHER): Payer: 59 | Admitting: Family Medicine

## 2011-01-22 VITALS — BP 130/84 | HR 91 | Ht 65.0 in | Wt 211.0 lb

## 2011-01-22 DIAGNOSIS — I10 Essential (primary) hypertension: Secondary | ICD-10-CM

## 2011-01-22 DIAGNOSIS — Z0000 Encounter for general adult medical examination without abnormal findings: Secondary | ICD-10-CM

## 2011-01-22 MED ORDER — DILTIAZEM HCL 120 MG PO TABS: 120.0000 mg | ORAL_TABLET | Freq: Every day | ORAL | Status: DC

## 2011-01-22 MED ORDER — NIACIN ER (ANTIHYPERLIPIDEMIC) 500 MG PO TBCR: 500.0000 mg | EXTENDED_RELEASE_TABLET | Freq: Every day | ORAL | Status: DC

## 2011-01-22 MED ORDER — ALBUTEROL SULFATE HFA 108 (90 BASE) MCG/ACT IN AERS: 2.0000 | INHALATION_SPRAY | RESPIRATORY_TRACT | Status: DC | PRN

## 2011-01-22 MED ORDER — OMEPRAZOLE 40 MG PO CPDR: 40.0000 mg | DELAYED_RELEASE_CAPSULE | Freq: Every day | ORAL | Status: DC

## 2011-01-22 MED ORDER — TRIAMCINOLONE ACETONIDE(NASAL) 55 MCG/ACT NA INHA: 2.0000 | Freq: Every day | NASAL | Status: DC

## 2011-01-22 MED ORDER — RAMIPRIL 5 MG PO CAPS: 5.0000 mg | ORAL_CAPSULE | Freq: Every day | ORAL | Status: DC

## 2011-01-22 MED ORDER — ROSUVASTATIN CALCIUM 10 MG PO TABS: 10.0000 mg | ORAL_TABLET | Freq: Every day | ORAL | Status: DC

## 2011-01-22 NOTE — Progress Notes (Signed)
  Subjective:    Patient ID: Natalie Hall, female    DOB: 1950-11-19, 60 y.o.   MRN: [id number]  HPI 60 yr old female for a cpx.    Review of Systems     Objective:   Physical Exam        Assessment & Plan:

## 2011-01-22 NOTE — Progress Notes (Addendum)
  Subjective:    Patient ID: Natalie Hall, female    DOB: [birth year], 60 y.o.   MRN: [id number]  HPI 60 yr old female for a cpx. She feels well except for some GERD symptoms. She has frequent heartburn and feels hot liquid in the back of the throat when she lies down at night.    Review of Systems  Constitutional: Negative.   HENT: Negative.   Eyes: Negative.   Respiratory: Negative.   Cardiovascular: Negative.   Gastrointestinal: Negative.   Genitourinary: Negative for dysuria, urgency, frequency, hematuria, flank pain, decreased urine volume, enuresis, difficulty urinating, pelvic pain and dyspareunia.  Musculoskeletal: Negative.   Skin: Negative.   Neurological: Negative.   Hematological: Negative.   Psychiatric/Behavioral: Negative.        Objective:   Physical Exam  Constitutional: She is oriented to person, place, and time. She appears well-developed and well-nourished. No distress.  HENT:  Head: Normocephalic and atraumatic.  Right Ear: External ear normal.  Left Ear: External ear normal.  Nose: Nose normal.  Mouth/Throat: Oropharynx is clear and moist. No oropharyngeal exudate.  Eyes: Conjunctivae and EOM are normal. Pupils are equal, round, and reactive to light. No scleral icterus.  Neck: Normal range of motion. Neck supple. No JVD present. No thyromegaly present.  Cardiovascular: Normal rate, regular rhythm, normal heart sounds and intact distal pulses.  Exam reveals no gallop and no friction rub.   No murmur heard. Pulmonary/Chest: Effort normal and breath sounds normal. No respiratory distress. She has no wheezes. She has no rales. She exhibits no tenderness.  Abdominal: Soft. Bowel sounds are normal. She exhibits no distension and no mass. There is no tenderness. There is no rebound and no guarding.  Genitourinary: No breast swelling, tenderness, discharge or bleeding.  Musculoskeletal: Normal range of motion. She exhibits no edema and no tenderness.    Lymphadenopathy:    She has no cervical adenopathy.  Neurological: She is alert and oriented to person, place, and time. She has normal reflexes. No cranial nerve deficit. She exhibits normal muscle tone. Coordination normal.  Skin: Skin is warm and dry. No rash noted. No erythema.  Psychiatric: She has a normal mood and affect. Her behavior is normal. Judgment and thought content normal.          Assessment & Plan:  Start on Omeprazole. Exercise and lose weight.

## 2011-02-20 ENCOUNTER — Encounter: Payer: Self-pay | Admitting: Family Medicine

## 2011-02-27 ENCOUNTER — Encounter: Payer: Self-pay | Admitting: Family Medicine

## 2011-02-27 ENCOUNTER — Ambulatory Visit (INDEPENDENT_AMBULATORY_CARE_PROVIDER_SITE_OTHER): Payer: 59 | Admitting: Family Medicine

## 2011-02-27 VITALS — BP 122/82 | HR 79 | Temp 98.6°F | Resp 14 | Wt 213.0 lb

## 2011-02-27 DIAGNOSIS — R21 Rash and other nonspecific skin eruption: Secondary | ICD-10-CM

## 2011-02-27 MED ORDER — HALOBETASOL PROPIONATE 0.05 % EX CREA: TOPICAL_CREAM | Freq: Two times a day (BID) | CUTANEOUS | Status: DC

## 2011-02-27 NOTE — Progress Notes (Signed)
  Subjective:    Patient ID: Natalie Hall, female    DOB: 03-21-1951, 60 y.o.   MRN: [id number]  HPI Here for an itchy rash on both forearms that appeared after spending the whole day last weekend on the water on their boat. She used sunblock but it did not work very well. Now she is using Topicort cream to the arms with no relief.    Review of Systems  Constitutional: Negative.   Skin: Positive for rash.       Objective:   Physical Exam  Constitutional: She appears well-developed and well-nourished.  Skin:       Both forearms have a red maculopapular rash on the dorsal side          Assessment & Plan:  This is a sun reaction. Switch to a more potent topical steroid cream. Recheck as needed

## 2011-03-06 NOTE — Assessment & Plan Note (Signed)
[facility]                            CARDIOLOGY OFFICE NOTE   NAME:Natalie Hall, Natalie Hall                      MRN:          [id number]  DATE:03/29/2008                            DOB:          20-Jun-1951    She returns today for follow-up.  I have seen her for PSVT and  palpitations, as well as hypertension and hypercholesterolemia.  She is  doing fairly well.  She is not having significant chest pain, PND or  orthopnea.  She gets occasional palpitations and has to take her  Cardizem maybe 2-3 times per month, and she has no prolonged episodes  and there is no associated diaphoresis or presyncope.   She needs a refill for Cardizem, called in at the [facility] at [facility].   REVIEW OF SYSTEMS:  Otherwise remarkable for a back strain that she has  had recently.  She picked up a box, and she has been taking Aleve for  it, otherwise negative.   CURRENT MEDICATIONS:  1. Nasacort.  2. Altace 5 a day.  3. Crestor 10 a day.  4. Niaspan 500 a day.  5. Cardizem 120 p.r.n.  6. Glucosamine.   PHYSICAL EXAMINATION:  GENERAL:  Remarkable for a healthy-appearing  white female in mild distress from her back pain.  VITAL SIGNS:  Blood pressure is 138/79, weight is 215, pulse 85 and  regular, respiratory rate 14, afebrile.  HEENT:  Unremarkable.  NECK:  Carotids normal without bruit.  No lymphadenopathy, thyromegaly  or JVP elevation.  LUNGS:  Clear.  Good diaphragmatic motion.  No wheezing.  CARDIOVASCULAR:  S1-S2.  Normal heart sounds.  PMI normal.  ABDOMEN:  Soft.  Bowel sounds positive.  No bruit, no tenderness, no  AAA, no hepatosplenomegaly or hepatojugular reflux.  EXTREMITIES:  Distal pulses are intact.  No edema.  NEURO:  Nonfocal.  SKIN:  Warm and dry.  No muscular weakness.   EKG is normal except for low-voltage, probably due to lead placement.   IMPRESSION:  1. Hypertension, currently well controlled.  Continue low-salt diet      and Altace.  2.  History of paroxysmal supraventricular tachycardia and PACs,      infrequent symptoms.  Continue p.r.n. Cardizem.  3. Hypercholesterolemia.  Continue Crestor.  Lipid and liver profile      in 6 months.  4. Lower back strain.  Continue Aleve.  Moist heat to lower back twice      a day.  Follow-up with primary care MD.     [name]. [name], MD, [facility]  Electronically Signed    PCN/MedQ  DD: 03/29/2008  DT: 03/29/2008  Job #: [id number]

## 2011-03-06 NOTE — Assessment & Plan Note (Signed)
[facility]                            CARDIOLOGY OFFICE NOTE   NAME:Natalie Hall, Natalie Hall                      MRN:          [id number]  DATE:03/24/2007                            DOB:          Nov 11, 1950    [name] is seen today in followup.  I have seen her in the past for  hypertension, palpitations, hypercholesterolemia.   In regards to the patient's blood pressure it has been well controlled,  she has not had any significant headaches, she has been compliant with  her medications.   In regards to her hypercholesterolemia she was taken off Lipitor last  year.  Her LDL cholesterol at that time was 125, she was switched to  Crestor.  Apparently, her HDL was low and she was placed on Niaspan.  She tells me that her labs were much improved in her physical in March  and we will try to get these results from Dr. Blair Heys office.   In regards to her palpitations she has had 2 episodes, one in April and  one about a week ago.  They tend to be sudden onset, she gets a flushed  feeling, she then needs to go to the bathroom and have a bowel movement.  Clinically it sounds like she may be having PSVT, however the episodes  are very isolated and are helped by taking her Cardizem p.r.n.  I had a  long discission with her regarding possible EP study or CardioNet  monitor.  She and I both agreed at the time these were not indicated and  that we would continue to take p.r.n. Cardizem.  She has had a previous  Myoview and echo that showed no structural heart disease.   REVIEW OF SYSTEMS:  Otherwise negative.   MEDICATIONS:  1. Nasacort p.r.n.  2. Vitamins.  3. Altace 5 mg a day.  4. Crestor 10 at night.  5. Niaspan 500 a day.  6. Cardizem 120 p.r.n.  7. Glucosamine.   EXAMINATION:  She is a healthy-appearing white female in no distress.  Affect is appropriate.  Blood pressure is 140/70, pulse 69 and regular,  she is afebrile, respiratory rate is 14, weight  is 212.  HEENT:  Normal, there is no thyromegaly, no lymphadenopathy, no carotid  bruits.  LUNGS:  Clear with no wheezing and normal diaphragmatic motion.  HEART:  Sounds were normal without murmur, rub, gallop, or click; PMI is  not palpable.  ABDOMEN:  Benign and bowel sounds are positive; there is no tenderness,  no AAA, no hepatosplenomegaly, no hepatojugular reflux.  Femorals were  +2 bilaterally without bruits, PTs were +2 bilaterally.  There is no lower extremity edema, no lymphadenopathy, no varicosities.  NEURO:  Nonfocal, there is no muscular weakness.   Her baseline EKG is totally normal with a heart rate of 69 and normal  intervals.   IMPRESSION:  1. Palpitations, probable PSVT, self-limited.  Continue p.r.n.      Cardizem, no indication for CardioNet monitor or invasive study.  2. Hypertension, currently well treated on Altace.  Continued      hyperlipidemia, to  follow up with Dr. Artis Flock.  Continue Crestor and      Niaspan.  3. Hypercholesterolemia:  Continue Crestro and Niaspan   Overall the patient is doing fairly well.  I will see her back in a year  unless she has an exacerbation of her palpitations symptoms.     Natalie Hall. [name], MD, [facility]  Electronically Signed    PCN/MedQ  DD: 03/24/2007  DT: 03/24/2007  Job #: [id number]   cc:   Quita Skye. Artis Flock, [name]

## 2011-03-09 ENCOUNTER — Ambulatory Visit (AMBULATORY_SURGERY_CENTER): Payer: 59 | Admitting: *Deleted

## 2011-03-09 ENCOUNTER — Encounter: Payer: Self-pay | Admitting: Gastroenterology

## 2011-03-09 VITALS — Ht 65.0 in | Wt 212.0 lb

## 2011-03-09 DIAGNOSIS — Z86010 Personal history of colonic polyps: Secondary | ICD-10-CM

## 2011-03-09 MED ORDER — PEG-KCL-NACL-NASULF-NA ASC-C 100 G PO SOLR: ORAL | Status: DC

## 2011-03-09 NOTE — Op Note (Signed)
Natalie Hall, Natalie Hall                 ACCOUNT NO.:  [id number]   MEDICAL RECORD NO.:  [id number]          PATIENT TYPE:  AMB   LOCATION:  ENDO                         FACILITY:  [facility]   PHYSICIAN:  Georgiana Spinner, [name]    DATE OF BIRTH:  1951-08-15   DATE OF PROCEDURE:  DATE OF DISCHARGE:                                 OPERATIVE REPORT   A copy of patient's original dictation should go to Dr. Margrett Rud as  well as to Dr. Lanell Matar.      GMO/MEDQ  D:  07/17/2004  T:  07/17/2004  Job:  [id number]

## 2011-03-09 NOTE — Op Note (Signed)
Natalie Hall, Natalie Hall                 ACCOUNT NO.:  [id number]   MEDICAL RECORD NO.:  [id number]          PATIENT TYPE:  AMB   LOCATION:  ENDO                         FACILITY:  [facility]   PHYSICIAN:  Georgiana Spinner, [name]    DATE OF BIRTH:  23-May-1951   DATE OF PROCEDURE:  DATE OF DISCHARGE:                                 OPERATIVE REPORT   PROCEDURE:  Flexible sigmoidoscopy with biopsy.   INDICATIONS:  Previous abnormality noted and subsequently biopsied via  endoscopic ultrasound.  This is a followup to do repeat mucosal biopsies.   ANESTHESIA:  Demerol 50, Versed 7 mg.   PROCEDURE:  With the patient mildly sedated in the left lateral decubitus  position, the Olympus videoscopic colonoscope was inserted into the rectum,  passed under direct vision to approximately 60 cm from the anal verge.  From  this point, the colonoscope was slowly withdrawn, taking circumferential  views of the colonic mucosa, stopping at approximately 20 cm, where the  previously noted submucosal-appearing filling defect was noted.  The mucosa  was photographed, and multiple biopsies of this area of the mucosa were  taken.  Once accomplished, the endoscope was withdrawn to the rectum, where  a small polyp was incidentally noted, photographed, and biopsied, using hot  biopsy forceps technique setting at 20/20 blended current.  The endoscope  was placed in retroflexion.  The endoscope was then straightened and  withdrawn.  Patient's vital signs and pulse oximetry remained stable.  Patient tolerated the procedure well without apparent complication.   FINDINGS:  Small polyp of rectum.  Larger submucosal lesion with mass effect  at 20 cm from the anal verge.  This area appeared rather soft and pliable  with biopsy forceps.   PLAN:  Await biopsy reports.  Patient will call me for results and follow up  with me as an outpatient.      GMO/MEDQ  D:  07/17/2004  T:  07/17/2004  Job:  [id number]   cc:   Truitt Merle  [facility] [place]  [place] [address]  Fax: [phone]

## 2011-03-09 NOTE — Op Note (Signed)
Natalie Hall, Natalie Hall                 ACCOUNT NO.:  [id number]   MEDICAL RECORD NO.:  [id number]          PATIENT TYPE:  AMB   LOCATION:  ENDO                         FACILITY:  [facility]   PHYSICIAN:  Georgiana Spinner, [name]    DATE OF BIRTH:  05-26-1951   DATE OF PROCEDURE:  08/12/2006  DATE OF DISCHARGE:                                 OPERATIVE REPORT   PROCEDURE:  Colonoscopy.   INDICATIONS:  Colon polyps.   ANESTHESIA:  Demerol 100 mg, Versed 7 mg.   PROCEDURE:  With the patient mildly sedated in the left lateral decubitus  position, the Olympus videoscopic colonoscope was inserted in the rectum and  passed under direct vision to the cecum -- identified by ileocecal valve and  appendiceal orifice, both of which were photographed.  After clearing the  cecum, the endoscope was slowly withdrawn, taking circumferential views of  colonic mucosa and stopping only in the rectum, which appeared normal on  direct and showed hemorrhoids on retroflexed view.  The endoscope was  straightened and withdrawn.  The patient's vital signs, pulse oximetry  remained stable.  The patient tolerated procedure well without apparent  complications.   FINDINGS:  Internal hemorrhoids without any other abnormalities noted, other  than a rare diverticulum of the sigmoid colon.  Specifically, no remnants of  what had been seen previously as submucosal impingement of the colon.   PLAN:  Have patient follow-up with me in 5 years or as needed.           ______________________________  Georgiana Spinner, [name]     GMO/MEDQ  D:  08/12/2006  T:  08/13/2006  Job:  [id number]   cc:   Quita Skye. Artis Flock, [name]

## 2011-03-09 NOTE — Op Note (Signed)
NAME:  Natalie Hall, Natalie Hall                         ACCOUNT NO.:  [id number]   MEDICAL RECORD NO.:  [id number]                   PATIENT TYPE:  AMB   LOCATION:  ENDO                                 FACILITY:  [facility]   PHYSICIAN:  Georgiana Spinner, [name]                 DATE OF BIRTH:  02-18-1951   DATE OF PROCEDURE:  DATE OF DISCHARGE:                                 OPERATIVE REPORT   PROCEDURE:  Colonoscopy.   INDICATIONS:  Colon polyps.   ANESTHESIA:  Demerol 100 mg, Versed 10 mg.   DESCRIPTION OF PROCEDURE:  With the patient mildly sedated and in the left  lateral decubitus position, the Olympus videoscopic colonoscope was inserted  in the rectum and passed under direct vision to the cecum, identified by  ileocecal valve and appendiceal orifice, both of which were photographed.  From this point,  the colonoscope was slowly withdrawn, taking  circumferential views of the colonic mucosa, stopping a few folds removed  from the ileocecal valve where a very tiny 3 mm polyp was seen, photographed  and removed with hot biopsy forceps technique, setting at 20/20 blended  current.  We withdrew and next encountered a fairly large mass-like lesion.  It did not have a typical appearance of a colonic malignancy but it was  certainly filling the lumen.  I tried to move it with the biopsy forceps to  see if I can find any definition to it, but it was rather amorphus.  There  was a question of a small superficial ulcer on the top of this lesion.  Therefore, I elected to photograph it and take multiple biopsies of this  area, and this approximately 20 cm from the anal verge.  Once accomplished,  the endoscope was then withdrawn all the way to the rectum which appeared  normal on direct and retroflexed view.  The endoscope was straightened and  withdrawn.  The patient's vital signs and pulse oximetry remained stable.  The patient tolerated the procedure well without apparent complications.   FINDINGS:  1. Small polyp in the ascending colon just a short distance from the     ileocecal valve.  2. A large mass-like finding in the area of the rectosigmoid at     approximately 20 cm from the anal verge.   PLAN:  Await biopsy report.  The patient will call me for results and follow  up with me as an outpatient.                                               Georgiana Spinner, [name]    GMO/MEDQ  D:  01/31/2004  T:  01/31/2004  Job:  [id number]   cc:   Vale Haven. Andrey Campanile, [name]  [address]  [place]  [place] [address]  Fax: [phone]

## 2011-03-09 NOTE — Op Note (Signed)
NAME:  Natalie Hall, Natalie Hall                         ACCOUNT NO.:  [id number]   MEDICAL RECORD NO.:  [id number]                   PATIENT TYPE:  AMB   LOCATION:  DSC                                  FACILITY:  [facility]   PHYSICIAN:  Robert A. Thurston Hole, [name]              DATE OF BIRTH:  [birth year]   DATE OF PROCEDURE:  07/05/2003  DATE OF DISCHARGE:                                 OPERATIVE REPORT   PREOPERATIVE DIAGNOSIS:  Left ankle peroneal tendon brevis rupture.   POSTOPERATIVE DIAGNOSIS:  Left ankle peroneal tendon brevis rupture.   OPERATION:  Left ankle examination under anesthesia followed by peroneal  tendon brevis debridement and repair with tenodesis to peroneus longus.   SURGEON:  Elana Alm. Thurston Hole, [name]   ASSISTANT:  Gifford Shave, P.A.   ANESTHESIA:  General.   OPERATIVE TIME:  1 hour.   COMPLICATIONS:  None.   INDICATIONS FOR PROCEDURE:  Natalie Hall is a 60 year old woman who has had  significant left ankle pain for the past 5 to 6 months, increasing in  nature, with signs and symptoms and MRI documenting a peroneus brevis  rupture. She is now to undergo debridement and repair.   DESCRIPTION OF PROCEDURE:  Natalie. Hall was brought to the operating room on  07/05/2003.  She was placed on the operating room table in a supine position.  After adequate level of general anesthesia was obtained, her left ankle was  examined under anesthesia.  She had dorsiflexion of 5 degrees, plantar  flexion of 20 degrees.  Her ankle was stable to ligamentous exam.  She  received Ancef 1 g IV preoperatively for prophylaxis.  Her left foot and leg  was prepped using sterile Duraprep and draped using sterile technique.  The  leg was exsanguinated and calf tourniquet was elevated to 300 mmHg.  Initially through a 7 cm posterior lateral incision based along the length  of peroneal brevis tendon, initial exposure was made.  Subcutaneous tissues  were incised in line with skin incision.  Sural nerve  carefully protected.  The peroneal tendon sheath was incised longitudinally from approximately 3  to 4 cm proximal to the fibula to approximately 2 cm distal.  The peroneus  longus was easily identified.  The brevis was identified as well and found  to be torn just proximal to the distal fibula.  There was significant  thickened granulation type tissue in and around the sheath, and this was  thoroughly debrided.  The peroneal brevis granulation tissue was completely  debrided as well, and then a longitudinal slit was made in the brevis such  that a  piece of each of the tails of this could be placed on either side of  the longus, and then using #2 Fibre wire, multiple mattress type sutures  were placed, attaching and tenodesing the peroneal brevis to the longus.  After this was done, there was found to be  adequate satisfactory repair and  tenodesis.  The wound was then copiously irrigated.  The tendon sheath was  then closed with 2-0 Vicryl suture.  Subcutaneous tissue was closed 2-0  Vicryl and skin closed with skin staples.  Sterile dressings were applied as  well as a short leg splint.  Tourniquet was released.  The patient was  awakened and taken to the recovery room in stable condition.   FOLLOWUP CARE:  Natalie. Hall will be followed as an outpatient on Percocet and  Naprosyn.  Will see her back in the  office ina week for sutures out and  followup.                                                Robert A. Thurston Hole, [name]    RAW/MEDQ  D:  07/05/2003  T:  07/05/2003  Job:  [id number]

## 2011-03-09 NOTE — Op Note (Signed)
[place]  Patient:    Natalie Hall, Natalie Hall                      MRN: [id number] Proc. Date: 04/01/01 Adm. Date:  [id number] Attending:  Susa Day CC:         Katy Fitch. Naaman Plummer., [name] (2)  Almedia Balls. Randell Patient, [name]   Operative Report  PREOPERATIVE DIAGNOSES: 1. Entrapment neuropathy, median nerve, left carpal tunnel. 2. 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  POSTOPERATIVE DIAGNOSES: 1. Entrapment neuropathy, median nerve, left carpal tunnel. 2. 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  OPERATION: 1. Release of left transverse carpal ligament. 2. Release of left thumb A1 pulley.  SURGEON:  Katy Fitch. Sypher, Montez Hageman., [name]  ASSISTANT:  Marveen Reeks Dasnoit, P.A.-C.  ANESTHESIA:  General by LMA.  ANESTHESIOLOGIST:  Janetta Hora. Gelene Mink, [name]  INDICATIONS:  [name] is a 60 year old woman who has been troubled by numbness in her hands, and triggering of her left thumb.  She has failed nonoperative management, including splinting, activity modification, and anti-inflammatory medication.  Due to the failure to respond to nonoperative measures, she is brought to the operating room at this time for a release of her left transverse carpal ligament and release of her left thumb A1 pulley.  DESCRIPTION OF PROCEDURE:   [name] is brought to the operating room and placed in the supine position upon the operating room table.  Following the induction of general anesthesia by LMA, the left arm was prepped with Betadine soap and solution and sterilely draped.  Following the exsanguination of the limb with the Esmarch bandage, an arterial tourniquet was placed to 240 mmHg, due to mild systolic hypertension.  The surgery commenced with a short incision in the line of the ring finger in the palm.  The subcutaneous tissues were carefully divided, revealing the palmar fascia.   This was split longitudinally in line of its fibers, to reveal the common sensory branches of the median nerve.  These were followed to the transverse carpal ligament.  The nerve was gently released from the ligament, followed by an incision of the ulnar aspect of the transverse carpal ligament with scissors, extending into the distal forearm.  This widely opened the carpal canal.  No masses or other predictaments were noted.  The ulnar bursa was fibrotic and hypertrophic.  The bleeding points were electrocauterized with bipolar current, followed by a repair of the skin with intradermal #3-0 Prolene sutures.  Attention was then directed to the thumb.  A short transverse incision was fashioned at the proximal thumb flexion crease.  The subcutaneous tissues were carefully divided, taking care to gently retract the radial _____________ nerve of the thumb.  The A1 pulley was isolated and split with the scalpel and scissors.  Thereafter the flexor pollicis longus was delivered, and cleaned of an area of cartilaginous metaplasia.  Thereafter, full excursion of the flexor pollicis longus was recovered.  The wound was then repaired with interrupted sutures of #5-0 nylon.  A compressive dressing was applied, with a volar plaster splint and ______ in 5 degrees of dorsiflexion. DD:  04/01/01 TD:  04/01/01 Job: [id number]

## 2011-03-13 ENCOUNTER — Telehealth: Payer: Self-pay | Admitting: Gastroenterology

## 2011-03-13 NOTE — Telephone Encounter (Signed)
Colonoscopy 07/2006 with Dr. Virginia Rochester; internal hems, rare L sided tics.  Colonoscopy 06/2004 small rectal polyp (TA on path) and "larger submucosal lesion with mass effect at 20cm from anal verge" that was lymphoid aggregates on path; sent to Dr. Lanell Matar.   OK to proceed with upcoming, already scheduled direct colonoscopy.

## 2011-03-26 ENCOUNTER — Ambulatory Visit (AMBULATORY_SURGERY_CENTER): Payer: 59 | Admitting: Gastroenterology

## 2011-03-26 ENCOUNTER — Encounter: Payer: Self-pay | Admitting: Gastroenterology

## 2011-03-26 VITALS — BP 131/70 | HR 72 | Temp 99.0°F | Resp 16 | Ht 65.0 in | Wt 210.0 lb

## 2011-03-26 DIAGNOSIS — Z860100 Personal history of colon polyps, unspecified: Secondary | ICD-10-CM

## 2011-03-26 DIAGNOSIS — Z1211 Encounter for screening for malignant neoplasm of colon: Secondary | ICD-10-CM

## 2011-03-26 DIAGNOSIS — D126 Benign neoplasm of colon, unspecified: Secondary | ICD-10-CM

## 2011-03-26 DIAGNOSIS — K5730 Diverticulosis of large intestine without perforation or abscess without bleeding: Secondary | ICD-10-CM

## 2011-03-26 MED ORDER — SODIUM CHLORIDE 0.9 % IV SOLN: 500.0000 mL | INTRAVENOUS | Status: DC

## 2011-03-26 NOTE — Patient Instructions (Signed)
Follow discharge instructions.  Continue your medications.  Begin a high fiber diet with liberal fluid intake.  Await pathology results.

## 2011-03-27 ENCOUNTER — Telehealth: Payer: Self-pay | Admitting: *Deleted

## 2011-03-27 NOTE — Telephone Encounter (Signed)

## 2011-05-25 ENCOUNTER — Encounter: Payer: Self-pay | Admitting: Family Medicine

## 2011-05-25 ENCOUNTER — Ambulatory Visit (INDEPENDENT_AMBULATORY_CARE_PROVIDER_SITE_OTHER)
Admission: RE | Admit: 2011-05-25 | Discharge: 2011-05-25 | Disposition: A | Discharge status: Home / Self Care | Payer: 59 | Source: Ambulatory Visit | Attending: Family Medicine | Admitting: Family Medicine

## 2011-05-25 ENCOUNTER — Ambulatory Visit (INDEPENDENT_AMBULATORY_CARE_PROVIDER_SITE_OTHER): Payer: 59 | Admitting: Family Medicine

## 2011-05-25 VITALS — BP 134/86 | HR 79 | Temp 98.7°F | Wt 215.0 lb

## 2011-05-25 DIAGNOSIS — M542 Cervicalgia: Secondary | ICD-10-CM

## 2011-05-25 MED ORDER — ETODOLAC 500 MG PO TABS: 500.0000 mg | ORAL_TABLET | Freq: Two times a day (BID) | ORAL | Status: DC

## 2011-05-25 NOTE — Progress Notes (Signed)
  Subjective:    Patient ID: Natalie Hall, female    DOB: [birth year], 60 y.o.   MRN: [id number]  HPI Here for several months of worsening stiffness and pain at the base of the neck. Heat helps. Using Aleve. 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 No pain or numbness in the arms.   Review of Systems  Constitutional: Negative.   Musculoskeletal: Positive for arthralgias.       Objective:   Physical Exam  Constitutional: She appears well-developed and well-nourished.  Musculoskeletal:       The neck is tender over the C5-C6 area. Flexion is full but extension is limited by pain.          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 Try Etodolac. Get cervical spine Xrays

## 2011-05-29 ENCOUNTER — Telehealth: Payer: Self-pay | Admitting: Family Medicine

## 2011-05-29 DIAGNOSIS — M542 Cervicalgia: Secondary | ICD-10-CM

## 2011-05-29 NOTE — Telephone Encounter (Signed)
Pt aware.

## 2011-07-25 ENCOUNTER — Encounter: Payer: Self-pay | Admitting: Family Medicine

## 2011-07-25 ENCOUNTER — Ambulatory Visit (INDEPENDENT_AMBULATORY_CARE_PROVIDER_SITE_OTHER): Payer: 59 | Admitting: Family Medicine

## 2011-07-25 VITALS — BP 144/88 | HR 85 | Temp 98.5°F | Wt 218.0 lb

## 2011-07-25 DIAGNOSIS — J329 Chronic sinusitis, unspecified: Secondary | ICD-10-CM

## 2011-07-25 MED ORDER — AMOXICILLIN-POT CLAVULANATE 875-125 MG PO TABS: 1.0000 | ORAL_TABLET | Freq: Two times a day (BID) | ORAL | Status: AC

## 2011-07-25 NOTE — Progress Notes (Signed)
  Subjective:    Patient ID: Natalie Hall, female    DOB: [birth year], 60 y.o.   MRN: [id number]  HPI Here for 6 days of sinus pressure, ear pressure, HA, and PND. No fever or cough. On Claritin.    Review of Systems  Constitutional: Negative.   HENT: Positive for congestion, postnasal drip and sinus pressure.   Eyes: Negative.   Respiratory: Negative.        Objective:   Physical Exam  Constitutional: She appears well-developed and well-nourished.  HENT:  Right Ear: External ear normal.  Left Ear: External ear normal.  Nose: Nose normal.  Mouth/Throat: Oropharynx is clear and moist. No oropharyngeal exudate.  Eyes: Conjunctivae are normal. Pupils are equal, round, and reactive to light.  Neck: No thyromegaly present.  Pulmonary/Chest: Effort normal and breath sounds normal.  Lymphadenopathy:    She has no cervical adenopathy.          Assessment & Plan:  Add Mucinex

## 2011-08-06 ENCOUNTER — Ambulatory Visit: Payer: 59

## 2011-08-06 ENCOUNTER — Ambulatory Visit (INDEPENDENT_AMBULATORY_CARE_PROVIDER_SITE_OTHER): Payer: 59

## 2011-08-06 DIAGNOSIS — Z23 Encounter for immunization: Secondary | ICD-10-CM

## 2011-09-25 ENCOUNTER — Encounter: Payer: Self-pay | Admitting: Family Medicine

## 2011-09-25 ENCOUNTER — Ambulatory Visit (INDEPENDENT_AMBULATORY_CARE_PROVIDER_SITE_OTHER): Payer: 59 | Admitting: Family Medicine

## 2011-09-25 VITALS — BP 134/82 | HR 86 | Temp 98.7°F | Wt 214.0 lb

## 2011-09-25 DIAGNOSIS — J329 Chronic sinusitis, unspecified: Secondary | ICD-10-CM

## 2011-09-25 MED ORDER — AMOXICILLIN-POT CLAVULANATE 875-125 MG PO TABS: 1.0000 | ORAL_TABLET | Freq: Two times a day (BID) | ORAL | Status: AC

## 2011-09-25 MED ORDER — HYDROCODONE-HOMATROPINE 5-1.5 MG/5ML PO SYRP: 5.0000 mL | ORAL_SOLUTION | ORAL | Status: AC | PRN

## 2011-09-25 NOTE — Progress Notes (Signed)
  Subjective:    Patient ID: Natalie Hall, female    DOB: 09-29-1951, 60 y.o.   MRN: [id number]  HPI Here for one week of sinus pressure, PND, ST, and coughing up yellow sputum. No fever.    Review of Systems  Constitutional: Negative.   HENT: Positive for congestion, postnasal drip and sinus pressure.   Eyes: Negative.   Respiratory: Positive for cough.        Objective:   Physical Exam  Constitutional: She appears well-developed and well-nourished.  HENT:  Right Ear: External ear normal.  Left Ear: External ear normal.  Nose: Nose normal.  Mouth/Throat: Oropharynx is clear and moist. No oropharyngeal exudate.  Eyes: Conjunctivae are normal.  Neck: No thyromegaly present.  Pulmonary/Chest: Effort normal and breath sounds normal.  Lymphadenopathy:    She has no cervical adenopathy.          Assessment & Plan:  Recheck prn

## 2011-10-07 ENCOUNTER — Emergency Department (INDEPENDENT_AMBULATORY_CARE_PROVIDER_SITE_OTHER): Payer: 59

## 2011-10-07 ENCOUNTER — Encounter (HOSPITAL_BASED_OUTPATIENT_CLINIC_OR_DEPARTMENT_OTHER): Payer: Self-pay | Admitting: Emergency Medicine

## 2011-10-07 ENCOUNTER — Emergency Department (HOSPITAL_BASED_OUTPATIENT_CLINIC_OR_DEPARTMENT_OTHER)
Admission: EM | Admit: 2011-10-07 | Discharge: 2011-10-07 | Disposition: A | Discharge status: Home / Self Care | Payer: 59 | Attending: Emergency Medicine | Admitting: Emergency Medicine

## 2011-10-07 DIAGNOSIS — M79673 Pain in unspecified foot: Secondary | ICD-10-CM

## 2011-10-07 DIAGNOSIS — E785 Hyperlipidemia, unspecified: Secondary | ICD-10-CM | POA: Insufficient documentation

## 2011-10-07 DIAGNOSIS — Z79899 Other long term (current) drug therapy: Secondary | ICD-10-CM | POA: Insufficient documentation

## 2011-10-07 DIAGNOSIS — M7730 Calcaneal spur, unspecified foot: Secondary | ICD-10-CM

## 2011-10-07 DIAGNOSIS — K219 Gastro-esophageal reflux disease without esophagitis: Secondary | ICD-10-CM | POA: Insufficient documentation

## 2011-10-07 DIAGNOSIS — J45909 Unspecified asthma, uncomplicated: Secondary | ICD-10-CM | POA: Insufficient documentation

## 2011-10-07 DIAGNOSIS — M79609 Pain in unspecified limb: Secondary | ICD-10-CM | POA: Insufficient documentation

## 2011-10-07 DIAGNOSIS — I10 Essential (primary) hypertension: Secondary | ICD-10-CM | POA: Insufficient documentation

## 2011-10-07 HISTORY — PX: ACHILLES TENDON REPAIR: SUR1153

## 2011-10-07 MED ORDER — HYDROCODONE-ACETAMINOPHEN 5-325 MG PO TABS
1.0000 | ORAL_TABLET | Freq: Once | ORAL | Status: AC
  Administered 2011-10-07: 1 via ORAL
  Filled 2011-10-07: qty 1

## 2011-10-07 NOTE — ED Provider Notes (Signed)
History    This chart was scribed for Natalie Baker, MD, MD by Smitty Pluck. The patient was seen in room MHT13 and the patient's care was started at 5:07PM.   CSN: [id number] Arrival date & time: 10/07/2011  3:11 PM   First MD Initiated Contact with Patient 10/07/11 1654      Chief Complaint  Patient presents with  . Foot Injury    (Consider location/radiation/quality/duration/timing/severity/associated sxs/prior treatment) Patient is a 60 y.o. female presenting with foot injury. The history is provided by the patient.  Foot Injury   Natalie Hall is a 60 y.o. female who presents to the Emergency Department complaining of moderate persistent right heel pain after sleeping while walking on brick chips today. Pt denies other pain besides this. Pain was worse with walking, sx improving. Denied loc. Denies back pain, head injury, or any other sx. Pt tripped and that was cause of her injury.   Past Medical History  Diagnosis Date  . Chickenpox   . Palpitation     hx  . Osteoarthritis   . Hypertension   . Hx of colonic polyp   . Asthma   . Allergy   . GERD (gastroesophageal reflux disease)   . Hyperlipidemia     Past Surgical History  Procedure Date  . Breast biopsy   . Carpal tunnel release   . Cholecystectomy   . Abdominal hysterectomy   . Varicose vein surgery   . Synvisc inj     dr Despina Hick 2008  . Colonoscopy   . Polypectomy   . Ankle surgery     torn tendon left ankle    Family History  Problem Relation Age of Onset  . Breast cancer    . Coronary artery disease    . Colon cancer    . Diabetes    . Hypertension    . Kidney disease    . Lung cancer    . Colon polyps Mother   . Colon cancer Maternal Aunt   . Liver cancer Maternal Aunt     History  Substance Use Topics  . Smoking status: Never Smoker   . Smokeless tobacco: Never Used  . Alcohol Use: No    OB History    Grav Para Term Preterm Abortions TAB SAB Ect Mult Living                   Review of Systems  All other systems reviewed and are negative.   10 Systems reviewed and are negative for acute change except as noted in the HPI.  Allergies  Codeine  Home Medications   Current Outpatient Rx  Name Route Sig Dispense Refill  . ALBUTEROL SULFATE HFA 108 (90 BASE) MCG/ACT IN AERS Inhalation Inhale 1-2 puffs into the lungs every 4 (four) hours as needed. For cough     . VITAMIN C 500 MG PO TABS Oral Take 500 mg by mouth daily.      Marland Kitchen CALCIUM CARBONATE 1500 MG PO TABS Oral Take by mouth.      . CETIRIZINE HCL 10 MG PO TABS Oral Take 10 mg by mouth daily.      Marland Kitchen DILTIAZEM HCL 120 MG PO TABS Oral Take 1 tablet (120 mg total) by mouth daily. 30 tablet 11  . OMEGA-3 FATTY ACIDS 1000 MG PO CAPS Oral Take 2 g by mouth daily.      Marland Kitchen GLUCOSAMINE-CHONDROITIN 500-400 MG PO TABS Oral Take 1 tablet by mouth daily.      Marland Kitchen  MELOXICAM 15 MG PO TABS Oral Take 15 mg by mouth daily.      . ADULT MULTIVITAMIN W/MINERALS CH Oral Take 1 tablet by mouth daily.      Marland Kitchen NIACIN (ANTIHYPERLIPIDEMIC) 500 MG PO TBCR Oral Take 1 tablet (500 mg total) by mouth at bedtime. 90 tablet 3  . OMEPRAZOLE 40 MG PO CPDR Oral Take 1 capsule (40 mg total) by mouth daily. 90 capsule 3  . RAMIPRIL 5 MG PO CAPS Oral Take 1 capsule (5 mg total) by mouth daily. 90 capsule 3  . ROSUVASTATIN CALCIUM 10 MG PO TABS Oral Take 1 tablet (10 mg total) by mouth daily. 90 tablet 3  . TRIAMCINOLONE ACETONIDE 55 MCG/ACT NA INHA Nasal 2 sprays by Nasal route daily. 1 Inhaler 11  . VITAMIN E 600 UNITS PO CAPS Oral Take 600 Units by mouth daily.        BP 144/53  Pulse 83  Temp(Src) 98.1 F (36.7 C) (Oral)  Resp 18  SpO2 100%  Physical Exam  Nursing note and vitals reviewed. Constitutional: She is oriented to person, place, and time. She appears well-developed and well-nourished. No distress.  HENT:  Head: Normocephalic and atraumatic.  Eyes: EOM are normal. Pupils are equal, round, and reactive to light.  Neck:  Normal range of motion. Neck supple. No tracheal deviation present.  Cardiovascular: Normal rate.   Pulmonary/Chest: Effort normal. No respiratory distress.  Abdominal: Soft. She exhibits no distension.  Musculoskeletal: Normal range of motion. She exhibits tenderness.       Right ankle: She exhibits swelling and ecchymosis.       Feet:       Hematoma right heel  Pain at achilles tendon   Neurological: She is alert and oriented to person, place, and time.  Skin: Skin is warm and dry.  Psychiatric: She has a normal mood and affect. Her behavior is normal.    ED Course  Procedures (including critical care time) DIAGNOSTIC STUDIES: Oxygen Saturation is 100% on room air, normal by my interpretation.    COORDINATION OF CARE:    Labs Reviewed - No data to display Dg Foot Complete Right  10/07/2011  *RADIOLOGY REPORT*  Clinical Data: Heel pain  RIGHT FOOT COMPLETE - 3+ VIEW  Comparison: None  Findings: Posterior and plantar calcaneal heel spurs noted.There is no evidence of fracture or dislocation.  There is no evidence of arthropathy or other focal bone abnormality.  Soft tissues are unremarkable.  IMPRESSION:  1.  Heel spurs. 2.  No acute fractures or subluxations.  Original Report Authenticated By: Rosealee Albee, [name]     No diagnosis found.    MDM  Pt given crutches and aso along with ortho referral, thompson test neg for achilles tendon rupture     I personally performed the services described in this documentation, which was scribed in my presence. The recorded information has been reviewed and considered.    Natalie Baker, MD 10/07/11 [phone]

## 2011-10-07 NOTE — ED Notes (Signed)
Pt to ED via EMS with c/o RT heel pain s/p fall (slipped when walking on brick chips)

## 2011-10-11 ENCOUNTER — Telehealth: Payer: Self-pay | Admitting: Cardiovascular Disease

## 2011-10-11 ENCOUNTER — Encounter: Payer: Self-pay | Admitting: *Deleted

## 2011-10-11 NOTE — Telephone Encounter (Signed)
LM RE PT NEEDING TO HAVE SURGERY FOR  RUPTURED ACHILLES TENDON  AFTER  HOLIDAYS MAY PROCEED WITH SURGERY PER DR NISHAN  DOES NOT NEED TO SEE./CY

## 2011-10-11 NOTE — Telephone Encounter (Signed)
PT AWARE MAY PROCEED WITH SURGERY PER DR [name]  NOTE FAXED TO DR Sherene Sires OFFICE .Natalie Hall

## 2011-10-11 NOTE — Telephone Encounter (Signed)
Per pt needs to be seen today or tomorrow so she can have surgery. Per pt Dr. Jonetta Osgood office has already called.

## 2011-12-13 ENCOUNTER — Other Ambulatory Visit: Payer: Self-pay | Admitting: Family Medicine

## 2011-12-14 ENCOUNTER — Encounter: Payer: Self-pay | Admitting: Family Medicine

## 2012-01-21 ENCOUNTER — Other Ambulatory Visit (INDEPENDENT_AMBULATORY_CARE_PROVIDER_SITE_OTHER): Payer: 59

## 2012-01-21 ENCOUNTER — Other Ambulatory Visit: Payer: 59

## 2012-01-21 DIAGNOSIS — Z0000 Encounter for general adult medical examination without abnormal findings: Secondary | ICD-10-CM

## 2012-01-21 LAB — CBC WITH DIFFERENTIAL/PLATELET
Basophils Absolute: 0 10*3/uL (ref 0.0–0.1)
Eosinophils Absolute: 0.2 10*3/uL (ref 0.0–0.7)
Lymphocytes Relative: 33.7 % (ref 12.0–46.0)
Lymphs Abs: 1.3 10*3/uL (ref 0.7–4.0)
Monocytes Relative: 10.2 % (ref 3.0–12.0)
Platelets: 178 10*3/uL (ref 150.0–400.0)
RDW: 14 % (ref 11.5–14.6)

## 2012-01-21 LAB — POCT URINALYSIS DIPSTICK
Ketones, UA: NEGATIVE
Leukocytes, UA: NEGATIVE
Protein, UA: NEGATIVE
pH, UA: 6

## 2012-01-21 LAB — BASIC METABOLIC PANEL
BUN: 12 mg/dL (ref 6–23)
Calcium: 9.2 mg/dL (ref 8.4–10.5)
GFR: 127.12 mL/min (ref >60.00)
Glucose, Bld: 97 mg/dL (ref 70–99)

## 2012-01-21 LAB — LIPID PANEL
HDL: 32.1 mg/dL — ABNORMAL LOW (ref >39.00)
Triglycerides: 161 mg/dL — ABNORMAL HIGH (ref 0.0–149.0)
VLDL: 32.2 mg/dL (ref 0.0–40.0)

## 2012-01-21 LAB — TSH: TSH: 1.27 u[IU]/mL (ref 0.35–5.50)

## 2012-01-21 LAB — HEPATIC FUNCTION PANEL
AST: 25 U/L (ref 0–37)
Alkaline Phosphatase: 49 U/L (ref 39–117)
Total Bilirubin: 0.3 mg/dL (ref 0.3–1.2)

## 2012-01-28 ENCOUNTER — Encounter: Payer: Self-pay | Admitting: Family Medicine

## 2012-01-28 ENCOUNTER — Ambulatory Visit (INDEPENDENT_AMBULATORY_CARE_PROVIDER_SITE_OTHER): Payer: 59 | Admitting: Family Medicine

## 2012-01-28 VITALS — BP 130/76 | HR 83 | Temp 98.5°F | Ht 64.75 in | Wt 210.0 lb

## 2012-01-28 DIAGNOSIS — Z0000 Encounter for general adult medical examination without abnormal findings: Secondary | ICD-10-CM

## 2012-01-28 MED ORDER — DILTIAZEM HCL 120 MG PO TABS: 120.0000 mg | ORAL_TABLET | Freq: Every day | ORAL | Status: DC

## 2012-01-28 MED ORDER — OMEPRAZOLE 40 MG PO CPDR: 40.0000 mg | DELAYED_RELEASE_CAPSULE | Freq: Two times a day (BID) | ORAL | Status: DC

## 2012-01-28 MED ORDER — TRIAMCINOLONE ACETONIDE(NASAL) 55 MCG/ACT NA INHA: 2.0000 | Freq: Every day | NASAL | Status: DC

## 2012-01-28 MED ORDER — ALBUTEROL SULFATE HFA 108 (90 BASE) MCG/ACT IN AERS: 1.0000 | INHALATION_SPRAY | RESPIRATORY_TRACT | Status: DC | PRN

## 2012-01-28 NOTE — Progress Notes (Signed)
Quick Note:  Pt came in today for a CPE. ______

## 2012-01-28 NOTE — Progress Notes (Signed)
  Subjective:    Patient ID: Natalie Hall, female    DOB: Dec 05, 1950, 60 y.o.   MRN: [id number]  HPI 61 yr old female for a cpx. She feels well in general except her GERD still bothers her at times despite taking Omeprazole.    Review of Systems  Constitutional: Negative.   HENT: Negative.   Eyes: Negative.   Respiratory: Negative.   Cardiovascular: Negative.   Gastrointestinal: Negative.   Genitourinary: Negative for dysuria, urgency, frequency, hematuria, flank pain, decreased urine volume, enuresis, difficulty urinating, pelvic pain and dyspareunia.  Musculoskeletal: Negative.   Skin: Negative.   Neurological: Negative.   Hematological: Negative.   Psychiatric/Behavioral: Negative.        Objective:   Physical Exam  Constitutional: She is oriented to person, place, and time. She appears well-developed and well-nourished. No distress.  HENT:  Head: Normocephalic and atraumatic.  Right Ear: External ear normal.  Left Ear: External ear normal.  Nose: Nose normal.  Mouth/Throat: Oropharynx is clear and moist. No oropharyngeal exudate.  Eyes: Conjunctivae and EOM are normal. Pupils are equal, round, and reactive to light. No scleral icterus.  Neck: Normal range of motion. Neck supple. No JVD present. No thyromegaly present.  Cardiovascular: Normal rate, regular rhythm, normal heart sounds and intact distal pulses.  Exam reveals no gallop and no friction rub.   No murmur heard.      EKG normal   Pulmonary/Chest: Effort normal and breath sounds normal. No respiratory distress. She has no wheezes. She has no rales. She exhibits no tenderness.  Abdominal: Soft. Bowel sounds are normal. She exhibits no distension and no mass. There is no tenderness. There is no rebound and no guarding.  Musculoskeletal: Normal range of motion. She exhibits no edema and no tenderness.  Lymphadenopathy:    She has no cervical adenopathy.  Neurological: She is alert and oriented to person, place,  and time. She has normal reflexes. No cranial nerve deficit. She exhibits normal muscle tone. Coordination normal.  Skin: Skin is warm and dry. No rash noted. No erythema.  Psychiatric: She has a normal mood and affect. Her behavior is normal. Judgment and thought content normal.          Assessment & Plan:

## 2012-02-25 ENCOUNTER — Other Ambulatory Visit: Payer: Self-pay | Admitting: Family Medicine

## 2012-06-30 ENCOUNTER — Encounter: Payer: Self-pay | Admitting: Family Medicine

## 2012-06-30 ENCOUNTER — Other Ambulatory Visit (INDEPENDENT_AMBULATORY_CARE_PROVIDER_SITE_OTHER): Payer: 59 | Admitting: Family Medicine

## 2012-06-30 ENCOUNTER — Ambulatory Visit (INDEPENDENT_AMBULATORY_CARE_PROVIDER_SITE_OTHER): Payer: 59 | Admitting: Family Medicine

## 2012-06-30 VITALS — BP 138/80 | HR 84 | Temp 98.5°F | Wt 210.0 lb

## 2012-06-30 DIAGNOSIS — L509 Urticaria, unspecified: Secondary | ICD-10-CM

## 2012-06-30 MED ORDER — PREDNISONE 10 MG PO TABS: ORAL_TABLET | ORAL | Status: DC

## 2012-06-30 MED ORDER — METHYLPREDNISOLONE ACETATE 80 MG/ML IJ SUSP
120.0000 mg | Freq: Once | INTRAMUSCULAR | Status: AC
  Administered 2012-06-30: 120 mg via INTRAMUSCULAR

## 2012-06-30 NOTE — Addendum Note (Signed)
Addended by: Aniceto Boss A on: 06/30/2012 04:32 PM   Modules accepted: Orders

## 2012-06-30 NOTE — Progress Notes (Signed)
  Subjective:    Patient ID: Natalie Hall, female    DOB: 01/22/1951, 61 y.o.   MRN: [id number]  HPI Here for 2 weeks of an itchy rash on the hands, arms, and legs. 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 No SOB. Using Benadryl.    Review of Systems  Constitutional: Negative.   Respiratory: Negative.   Cardiovascular: Negative.        Objective:   Physical Exam  Constitutional: She is oriented to person, place, and time. She appears well-developed and well-nourished.  HENT:  Mouth/Throat: Oropharynx is clear and moist.       No tongue or lip edema   Eyes: Conjunctivae are normal. Pupils are equal, round, and reactive to light.  Cardiovascular: Normal rate, regular rhythm, normal heart sounds and intact distal pulses.   Pulmonary/Chest: Effort normal and breath sounds normal.  Neurological: She is alert and oriented to person, place, and time.  Skin:       Diffuse red maculopapular rash as aove           Assessment & Plan:  Hives. Given a steroid shot, to be followed by a 16 day Prednisone taper from 40 mg a day down. Recheck prn

## 2012-07-01 ENCOUNTER — Ambulatory Visit: Payer: 59 | Admitting: Family Medicine

## 2012-07-01 DIAGNOSIS — Z0289 Encounter for other administrative examinations: Secondary | ICD-10-CM

## 2012-08-18 ENCOUNTER — Ambulatory Visit (INDEPENDENT_AMBULATORY_CARE_PROVIDER_SITE_OTHER): Payer: 59 | Admitting: Family Medicine

## 2012-08-18 DIAGNOSIS — Z23 Encounter for immunization: Secondary | ICD-10-CM

## 2012-09-22 ENCOUNTER — Other Ambulatory Visit: Payer: Self-pay | Admitting: Family Medicine

## 2012-09-22 NOTE — Telephone Encounter (Signed)
Pt needs new rxs fax to [facility] [phone] crestor 10 mg and ramipril 5 mg #90 each with 3 refills

## 2012-09-23 ENCOUNTER — Telehealth: Payer: Self-pay | Admitting: Family Medicine

## 2012-09-23 MED ORDER — RAMIPRIL 5 MG PO CAPS: 5.0000 mg | ORAL_CAPSULE | Freq: Every day | ORAL | Status: DC

## 2012-09-23 MED ORDER — ROSUVASTATIN CALCIUM 10 MG PO TABS: 10.0000 mg | ORAL_TABLET | Freq: Every day | ORAL | Status: DC

## 2012-09-23 NOTE — Telephone Encounter (Signed)
Refill request for Crestor & Ramipril and send to [facility]. I did send scripts e-scribe.

## 2012-09-23 NOTE — Telephone Encounter (Signed)
I sent scripts e-scribe. 

## 2012-10-20 ENCOUNTER — Telehealth: Payer: Self-pay | Admitting: Family Medicine

## 2012-10-20 MED ORDER — NIACIN ER (ANTIHYPERLIPIDEMIC) 500 MG PO TBCR: 500.0000 mg | EXTENDED_RELEASE_TABLET | Freq: Every day | ORAL | Status: DC

## 2012-10-20 MED ORDER — OMEPRAZOLE 40 MG PO CPDR: 40.0000 mg | DELAYED_RELEASE_CAPSULE | Freq: Every day | ORAL | Status: DC

## 2012-10-20 NOTE — Telephone Encounter (Signed)
Refill request for Prilosec & Niaspan and send to [facility]. I did send both scripts e-scribe.

## 2012-10-22 HISTORY — PX: CATARACT EXTRACTION W/ INTRAOCULAR LENS IMPLANT: SHX1309

## 2012-12-02 ENCOUNTER — Ambulatory Visit (INDEPENDENT_AMBULATORY_CARE_PROVIDER_SITE_OTHER): Payer: 59 | Admitting: Family Medicine

## 2012-12-02 ENCOUNTER — Encounter: Payer: Self-pay | Admitting: Family Medicine

## 2012-12-02 VITALS — BP 144/78 | HR 84 | Temp 98.4°F | Wt 214.0 lb

## 2012-12-02 DIAGNOSIS — G518 Other disorders of facial nerve: Secondary | ICD-10-CM

## 2012-12-02 DIAGNOSIS — G514 Facial myokymia: Secondary | ICD-10-CM

## 2012-12-02 NOTE — Progress Notes (Signed)
  Subjective:    Patient ID: Natalie Hall, female    DOB: Sep 03, 1951, 62 y.o.   MRN: [id number]  HPI Here to ask about several weeks of intermittent twitching of the left lower eyelid and a sensation of "drawing" of the left side of her face. No weakness or drooping. No numbness. No vision changes. No URI symptoms. No particular stressors lately. She gets plenty of sleep. No headaches.    Review of Systems  Constitutional: Negative.   HENT: Negative.   Neurological: Negative.        Objective:   Physical Exam  Constitutional: She is oriented to person, place, and time. She appears well-developed and well-nourished. No distress.  HENT:  Head: Normocephalic and atraumatic.  Right Ear: External ear normal.  Left Ear: External ear normal.  Nose: Nose normal.  Mouth/Throat: Oropharynx is clear and moist.  Eyes: Conjunctivae and EOM are normal. Pupils are equal, round, and reactive to light.  Neck: Neck supple. No thyromegaly present.  Lymphadenopathy:    She has no cervical adenopathy.  Neurological: She is alert and oriented to person, place, and time. She has normal reflexes. No cranial nerve deficit. She exhibits normal muscle tone. Coordination normal.          Assessment & Plan:  Her exam is completely normal today. She seems to have some irritation of the facial nerve on the left side, but not exactly a Bell's palsy. We elected to simply observe this for now. She will return if she experiences any weakness or other neurologic deficits.

## 2012-12-23 ENCOUNTER — Encounter: Payer: Self-pay | Admitting: Family Medicine

## 2013-01-22 ENCOUNTER — Other Ambulatory Visit (INDEPENDENT_AMBULATORY_CARE_PROVIDER_SITE_OTHER): Payer: 59

## 2013-01-22 DIAGNOSIS — Z0000 Encounter for general adult medical examination without abnormal findings: Secondary | ICD-10-CM

## 2013-01-22 LAB — POCT URINALYSIS DIPSTICK
Glucose, UA: NEGATIVE
Ketones, UA: NEGATIVE
Leukocytes, UA: NEGATIVE
Protein, UA: NEGATIVE
Spec Grav, UA: 1.01
Urobilinogen, UA: 0.2

## 2013-01-22 LAB — HEPATIC FUNCTION PANEL
Alkaline Phosphatase: 47 U/L (ref 39–117)
Bilirubin, Direct: 0 mg/dL (ref 0.0–0.3)

## 2013-01-22 LAB — BASIC METABOLIC PANEL
BUN: 14 mg/dL (ref 6–23)
CO2: 29 mEq/L (ref 19–32)
Calcium: 9.3 mg/dL (ref 8.4–10.5)
Creatinine, Ser: 0.6 mg/dL (ref 0.4–1.2)
Glucose, Bld: 92 mg/dL (ref 70–99)

## 2013-01-22 LAB — LIPID PANEL
Cholesterol: 185 mg/dL (ref 0–200)
Total CHOL/HDL Ratio: 6
Triglycerides: 218 mg/dL — ABNORMAL HIGH (ref 0.0–149.0)
VLDL: 43.6 mg/dL — ABNORMAL HIGH (ref 0.0–40.0)

## 2013-01-22 LAB — CBC WITH DIFFERENTIAL/PLATELET
Basophils Absolute: 0 10*3/uL (ref 0.0–0.1)
Eosinophils Absolute: 0.3 10*3/uL (ref 0.0–0.7)
Lymphocytes Relative: 31 % (ref 12.0–46.0)
MCHC: 34 g/dL (ref 30.0–36.0)
MCV: 86.5 fl (ref 78.0–100.0)
Monocytes Absolute: 0.4 10*3/uL (ref 0.1–1.0)
Neutrophils Relative %: 55.1 % (ref 43.0–77.0)
Platelets: 204 10*3/uL (ref 150.0–400.0)
RBC: 4.56 Mil/uL (ref 3.87–5.11)
RDW: 14.1 % (ref 11.5–14.6)

## 2013-01-23 NOTE — Progress Notes (Signed)
Quick Note:  Pt has appointment on 01/23/13 will go over then. ______ 

## 2013-01-29 ENCOUNTER — Encounter: Payer: Self-pay | Admitting: Family Medicine

## 2013-01-29 ENCOUNTER — Ambulatory Visit (INDEPENDENT_AMBULATORY_CARE_PROVIDER_SITE_OTHER): Payer: 59 | Admitting: Family Medicine

## 2013-01-29 VITALS — BP 130/68 | HR 86 | Temp 98.5°F | Ht 64.5 in | Wt 212.0 lb

## 2013-01-29 DIAGNOSIS — Z0000 Encounter for general adult medical examination without abnormal findings: Secondary | ICD-10-CM

## 2013-01-29 MED ORDER — DILTIAZEM HCL 120 MG PO TABS: 120.0000 mg | ORAL_TABLET | Freq: Every day | ORAL | Status: DC

## 2013-01-29 MED ORDER — TRIAMCINOLONE ACETONIDE(NASAL) 55 MCG/ACT NA INHA: 2.0000 | Freq: Every day | NASAL | Status: DC

## 2013-01-29 MED ORDER — NIACIN ER (ANTIHYPERLIPIDEMIC) 500 MG PO TBCR: 500.0000 mg | EXTENDED_RELEASE_TABLET | Freq: Every day | ORAL | Status: DC

## 2013-01-29 MED ORDER — ROSUVASTATIN CALCIUM 10 MG PO TABS: 10.0000 mg | ORAL_TABLET | Freq: Every day | ORAL | Status: DC

## 2013-01-29 MED ORDER — OMEPRAZOLE 40 MG PO CPDR: 40.0000 mg | DELAYED_RELEASE_CAPSULE | Freq: Every day | ORAL | Status: DC

## 2013-01-29 MED ORDER — ALBUTEROL SULFATE HFA 108 (90 BASE) MCG/ACT IN AERS: 1.0000 | INHALATION_SPRAY | RESPIRATORY_TRACT | Status: DC | PRN

## 2013-01-29 MED ORDER — RAMIPRIL 5 MG PO CAPS: 5.0000 mg | ORAL_CAPSULE | Freq: Every day | ORAL | Status: DC

## 2013-01-29 NOTE — Progress Notes (Signed)
  Subjective:    Patient ID: Natalie Hall, female    DOB: [birth year], 62 y.o.   MRN: [id number]  HPI 62 yr old female for a cpx. She feels well and has no concerns.    Review of Systems  Constitutional: Negative.   HENT: Negative.   Eyes: Negative.   Respiratory: Negative.   Cardiovascular: Negative.   Gastrointestinal: Negative.   Genitourinary: Negative for dysuria, urgency, frequency, hematuria, flank pain, decreased urine volume, enuresis, difficulty urinating, pelvic pain and dyspareunia.  Musculoskeletal: Negative.   Skin: Negative.   Neurological: Negative.   Psychiatric/Behavioral: Negative.        Objective:   Physical Exam  Constitutional: She is oriented to person, place, and time. She appears well-developed and well-nourished. No distress.  HENT:  Head: Normocephalic and atraumatic.  Right Ear: External ear normal.  Left Ear: External ear normal.  Nose: Nose normal.  Mouth/Throat: Oropharynx is clear and moist. No oropharyngeal exudate.  Eyes: Conjunctivae and EOM are normal. Pupils are equal, round, and reactive to light. No scleral icterus.  Neck: Normal range of motion. Neck supple. No JVD present. No thyromegaly present.  Cardiovascular: Normal rate, regular rhythm, normal heart sounds and intact distal pulses.  Exam reveals no gallop and no friction rub.   No murmur heard. EKG normal   Pulmonary/Chest: Effort normal and breath sounds normal. No respiratory distress. She has no wheezes. She has no rales. She exhibits no tenderness.  Abdominal: Soft. Bowel sounds are normal. She exhibits no distension and no mass. There is no tenderness. There is no rebound and no guarding.  Musculoskeletal: Normal range of motion. She exhibits no edema and no tenderness.  Lymphadenopathy:    She has no cervical adenopathy.  Neurological: She is alert and oriented to person, place, and time. She has normal reflexes. No cranial nerve deficit. She exhibits normal muscle tone.  Coordination normal.  Skin: Skin is warm and dry. No rash noted. No erythema.  Psychiatric: She has a normal mood and affect. Her behavior is normal. Judgment and thought content normal.          Assessment & Plan:  Well exam

## 2013-02-09 ENCOUNTER — Encounter: Payer: Self-pay | Admitting: *Deleted

## 2013-02-10 ENCOUNTER — Telehealth: Payer: Self-pay | Admitting: Family Medicine

## 2013-02-10 NOTE — Telephone Encounter (Signed)
Pt called and stated that [facility] did not receive the following prescriptions; diltiazem (CARDIZEM) 120 MG tablet, omeprazole (PRILOSEC) 40 MG capsule, ramipril (ALTACE) 5 MG capsule, and  rosuvastatin (CRESTOR) 10 MG tablet. She states that she is almost out of the Cardizem, and Prilosec. Please assist. Pt would also like to be made aware when they are sent.

## 2013-02-12 NOTE — Telephone Encounter (Signed)
I spoke with [facility] and gave verbal order for all 4 scripts and they are going to put in as a rush order and will ship out today. Pt should receive within 3-5 days. Scripts were sent e-scribe on 01/29/13 but did not go thru. I left voice message for pt.

## 2013-02-25 ENCOUNTER — Other Ambulatory Visit: Payer: Self-pay | Admitting: Family Medicine

## 2013-07-24 ENCOUNTER — Ambulatory Visit (INDEPENDENT_AMBULATORY_CARE_PROVIDER_SITE_OTHER): Payer: 59

## 2013-07-24 DIAGNOSIS — Z23 Encounter for immunization: Secondary | ICD-10-CM

## 2013-11-11 ENCOUNTER — Encounter: Payer: Self-pay | Admitting: Cardiovascular Disease

## 2013-11-11 ENCOUNTER — Telehealth: Payer: Self-pay | Admitting: Cardiovascular Disease

## 2013-11-11 ENCOUNTER — Ambulatory Visit (INDEPENDENT_AMBULATORY_CARE_PROVIDER_SITE_OTHER): Payer: 59 | Admitting: Cardiovascular Disease

## 2013-11-11 VITALS — BP 132/78 | HR 78 | Ht 65.0 in | Wt 219.0 lb

## 2013-11-11 DIAGNOSIS — E785 Hyperlipidemia, unspecified: Secondary | ICD-10-CM

## 2013-11-11 DIAGNOSIS — I471 Supraventricular tachycardia: Secondary | ICD-10-CM

## 2013-11-11 DIAGNOSIS — Z01810 Encounter for preprocedural cardiovascular examination: Secondary | ICD-10-CM | POA: Insufficient documentation

## 2013-11-11 DIAGNOSIS — Z01818 Encounter for other preprocedural examination: Secondary | ICD-10-CM

## 2013-11-11 NOTE — Assessment & Plan Note (Signed)
Has been quiescent  Continue cardizem including the morning of surgery  ECG with no pre-excitation

## 2013-11-11 NOTE — Assessment & Plan Note (Signed)
No symptoms and only history of elevated lipids, obesity and SVT  Clear to have right TKR

## 2013-11-11 NOTE — Assessment & Plan Note (Signed)
Cholesterol is at goal.  Continue current dose of statin and diet Rx.  No myalgias or side effects.  F/U  LFT's in 6 months. Lab Results  Component Value Date   LDLCALC 70 01/21/2012

## 2013-11-11 NOTE — Telephone Encounter (Signed)
Received request from Nurse fax box, documents faxed for surgical clearance. To: [employer] Fax number: [phone] Attention: [id number]/kdm

## 2013-11-11 NOTE — Patient Instructions (Signed)
Your physician recommends that you schedule a follow-up appointment in: AS NEEDED  Your physician recommends that you continue on your current medications as directed. Please refer to the Current Medication list given to you today.  

## 2013-11-11 NOTE — Progress Notes (Signed)
Patient ID: Natalie Hall, female   DOB: 10-22-1951, 63 y.o.   MRN: [id number] Natalie Hall seen today in followup for her PSVT. It is been nonrecurring quite some time. She would like a refill on her Cardizem just in case. She has no history coronary disease with normal LV function by echo and no significant valvular heart disease. She has not seen Dr. Sarajane Hall for her medical needs. He follows her risk factors which include hypertension hypercholesterolemia. Reviewed her cholesterol work which was in the computer and they are fine on 10 mg of Crestor. She is trying to follow a low-sodium diet. She has not lost any weight recently. There's been no chest pain PND orthopnea no palpitations syncope.   Tore right achilles last year No issues with surgery Referred by Dr Natalie Hall for preop clearance for total right knee replacement    ROS: Denies fever, malais, weight loss, blurry vision, decreased visual acuity, cough, sputum, SOB, hemoptysis, pleuritic pain, palpitaitons, heartburn, abdominal pain, melena, lower extremity edema, claudication, or rash.  All other systems reviewed and negative  General: Affect appropriate Obese white female  HEENT: normal Neck supple with no adenopathy JVP normal no bruits no thyromegaly Lungs clear with no wheezing and good diaphragmatic motion Heart:  S1/S2 no murmur, no rub, gallop or click PMI normal Abdomen: benighn, BS positve, no tenderness, no AAA no bruit.  No HSM or HJR Distal pulses intact with no bruits No edema Neuro non-focal Skin warm and dry No muscular weakness   Current Outpatient Prescriptions  Medication Sig Dispense Refill  . albuterol (PROVENTIL HFA;VENTOLIN HFA) 108 (90 BASE) MCG/ACT inhaler Inhale 1-2 puffs into the lungs every 4 (four) hours as needed for wheezing or shortness of breath. For cough  3 Inhaler  3  . Calcium Carbonate 1500 MG TABS Take by mouth.        . cetirizine (ZYRTEC) 10 MG tablet Take 10 mg by mouth daily.        Marland Kitchen diltiazem  (CARDIZEM) 120 MG tablet Take 1 tablet (120 mg total) by mouth daily.  90 tablet  3  . Multiple Vitamin (MULITIVITAMIN WITH MINERALS) TABS Take 1 tablet by mouth daily.        . niacin (NIASPAN) 500 MG CR tablet Take 1 tablet (500 mg total) by mouth at bedtime.  90 tablet  3  . omeprazole (PRILOSEC) 40 MG capsule Take 1 capsule (40 mg total) by mouth daily.  90 capsule  3  . ramipril (ALTACE) 5 MG capsule Take 1 capsule (5 mg total) by mouth daily.  90 capsule  3  . rosuvastatin (CRESTOR) 10 MG tablet Take 1 tablet (10 mg total) by mouth daily.  90 tablet  3  . triamcinolone (NASACORT) 55 MCG/ACT nasal inhaler Place 2 sprays into the nose daily.  3 Inhaler  3   Current Facility-Administered Medications  Medication Dose Route Frequency Provider Last Rate Last Dose  . 0.9 %  sodium chloride infusion  500 mL Intravenous Continuous Milus Banister, MD        Allergies  Codeine   Electrocardiogram:  4/10  SR rate 66 normal   Today SR rate 76 low voltage nonspecific T wave change in lead 3  Assessment and Plan

## 2013-11-17 ENCOUNTER — Other Ambulatory Visit: Payer: Self-pay | Admitting: Family Medicine

## 2013-12-09 NOTE — Progress Notes (Signed)
Please put orders in Epic surgery 12-21-13 pre op 12-15-13 Thank you! 

## 2013-12-10 ENCOUNTER — Encounter (HOSPITAL_COMMUNITY): Payer: Self-pay | Admitting: Family Medicine

## 2013-12-10 ENCOUNTER — Other Ambulatory Visit: Payer: Self-pay | Admitting: Orthopedic Surgery

## 2013-12-10 NOTE — H&P (Signed)
Natalie Hall  DOB: 08/28/1951 Married / Language: English / Race: White Female  Date of Admission:  12-21-2013  Chief Complaint:  Left Knee Pain  History of Present Illness The patient is a 63 year old female who comes in for a preoperative History and Physical. The patient is scheduled for a left total knee arthroplasty to be performed by Dr. Frank V. Aluisio, MD at [facility] on 12-21-2013. The patient is a 63 year old female who presents today for follow up of their knee. The patient is being followed for their left knee pain and osteoarthritis. They are now several week(s) out from Synvisc injections. Symptoms reported today include: pain and swelling. The following medication has been used for pain control: Aleve. Note for "Follow-up Knee": Patient states that the injection only  helped some. She is still having swelling and pain. The patient states that the Synvisc provided minimal benefit. Even though it did help some with the pain, she is still having a lot of functional issues. She is unable to get around and do what she desires. The knee feels like it wants to give out on her. She has had a progressive deformity of the knee. She is now ready to get the knee fixed. They have been treated conservatively in the past for the above stated problem and despite conservative measures, they continue to have progressive pain and severe functional limitations and dysfunction. They have failed non-operative management including home exercise, medications, and injections. It is felt that they would benefit from undergoing total joint replacement. Risks and benefits of the procedure have been discussed with the patient and they elect to proceed with surgery. There are no active contraindications to surgery such as ongoing infection or rapidly progressive neurological disease.  Allergies Codeine Phosphate *ANALGESICS - OPIOID*. causes flushing **Please note that she IS able to  take hydrocodone.**  Problem List/Past Medical Primary osteoarthritis of one knee (715.16) Asthma Gastroesophageal Reflux Disease Hypercholesterolemia High blood pressure Osteoarthritis Cataract. Post Surgery Bilateral Heart murmur. Childhood Varicose veins Hiatal Hernia Degenerative Disc Disease Measles Mumps Rubella   Family History Kidney disease. Father. father Cerebrovascular Accident. First Degree Relatives. father Heart Disease. father and grandfather fathers side Cancer. grandmother mothers side, grandfather mothers side and grandmother fathers side Diabetes Mellitus. mother, father and grandmother mothers side Hypertension. mother, father and grandmother fathers side Father. Deceased. age 61 Mother. In good health. age 85   Social History Tobacco / smoke exposure. no Number of flights of stairs before winded. 1 Marital status. married Exercise. Exercises rarely; does running / walking Pain Contract. no Alcohol use. never consumed alcohol Children. 1 Living situation. live with spouse Current work status. working full time Tobacco use. Never smoker. never smoker Illicit drug use. no Drug/Alcohol Rehab (Currently). no Drug/Alcohol Rehab (Previously). no Post-Surgical Plans. Home   Medication History Diltiazem HCl ER Beads (180MG Capsule ER 24HR, Oral) Active. Ramipril (5MG Capsule, Oral) Active. Crestor (10MG Tablet, Oral) Active. Omeprazole (40MG Capsule DR, Oral) Active. Niaspan (500MG Tablet ER, Oral) Active. Multivitamin ( Oral) Active. Vitamin D3 (2000UNIT Capsule, Oral) Active. Calcium Carbonate (1250MG Tablet, Oral) Active. Loratadine (10MG Tablet, Oral) Active. Nasacort AQ (55MCG/ACT Aerosol Soln, Nasal) Active. Ventolin HFA (108 (90 Base)MCG/ACT Aerosol Soln, Inhalation) Active.   Past Surgical History Hysterectomy. partial (non-cancerous) Cesarean Delivery. 1 time Foot Surgery. Date: 2004. left Ankle  Surgery. Date: 2012. right - Achilles Tendon Repair Colon Polyp Removal - Colonoscopy Carpal Tunnel Repair. bilateral; 1998 and 2002 Breast Biopsy. left Gallbladder   Surgery. laporoscopic; 1996   Review of Systems General:Not Present- Chills, Fever, Night Sweats, Fatigue, Weight Gain, Weight Loss and Memory Loss. Skin:Not Present- Hives, Itching, Rash, Eczema and Lesions. HEENT:Present- Tinnitus. Not Present- Headache, Double Vision, Visual Loss, Hearing Loss and Dentures. Respiratory:Not Present- Shortness of breath with exertion, Shortness of breath at rest, Allergies, Coughing up blood and Chronic Cough. Cardiovascular:Present- Palpitations. Not Present- Chest Pain, Racing/skipping heartbeats, Difficulty Breathing Lying Down, Murmur and Swelling. Gastrointestinal:Not Present- Bloody Stool, Heartburn, Abdominal Pain, Vomiting, Nausea, Constipation, Diarrhea, Difficulty Swallowing, Jaundice and Loss of appetitie. Female Genitourinary:Not Present- Blood in Urine, Urinary frequency, Weak urinary stream, Discharge, Flank Pain, Incontinence, Painful Urination, Urgency, Urinary Retention and Urinating at Night. Musculoskeletal:Present- Joint Swelling, Joint Pain, Back Pain and Morning Stiffness. Not Present- Muscle Weakness, Muscle Pain and Spasms. Neurological:Not Present- Tremor, Dizziness, Blackout spells, Paralysis, Difficulty with balance and Weakness. Psychiatric:Not Present- Insomnia.    Vitals Weight: 215 lb Height: 65 in Weight was reported by patient. Height was reported by patient. Body Surface Area: 2.11 m Body Mass Index: 35.78 kg/m Pulse: 80 (Regular) Resp.: 14 (Unlabored) BP: 152/86 (Sitting, Right Arm, Standard)     Physical Exam The physical exam findings are as follows:   General Mental Status - Alert, cooperative and good historian. General Appearance- pleasant. Not in acute distress. Orientation- Oriented X3. Build &  Nutrition- Well nourished and Well developed.   Head and Neck Head- normocephalic, atraumatic . Neck Global Assessment- supple. no bruit auscultated on the right and no bruit auscultated on the left.   Eye Pupil- Bilateral- Regular and Round. Motion- Bilateral- EOMI.   Chest and Lung Exam Auscultation: Breath sounds:- clear at anterior chest Haggart and - clear at posterior chest Drost. Adventitious sounds:- No Adventitious sounds.   Cardiovascular Auscultation:Rhythm- Regular rate and rhythm. Heart Sounds- S1 WNL and S2 WNL. Murmurs & Other Heart Sounds:Auscultation of the heart reveals - No Murmurs.   Abdomen Inspection:Contour- Generalized mild distention. Palpation/Percussion:Tenderness- Abdomen is non-tender to palpation. Rigidity (guarding)- Abdomen is soft. Auscultation:Auscultation of the abdomen reveals - Bowel sounds normal.   Female Genitourinary Not done, not pertinent to present illness  Musculoskeletal On exam well developed female in no distress. Evaluation of her left knee shows valgus deformity of about 10 degrees. Her range of motion is about 5 to 125. There is moderate crepitus on range of motion. Tenderness medial and lateral. There is no instability noted.  RADIOGRAPHS: AP and lateral of the knee are reviewed and she has bone on bone arthritis in the lateral and patellofemoral compartments.   Assessment & Plan Primary osteoarthritis of one knee (715.16) Impression: Left Knee  Note: Plan is for a Left Total Knee Replacement by Dr. Wynelle Link.  Plan is to go home.  PCP - Dr. Sarajane Jews - Patient has been seen preoperatively and felt to be stable for surgery. Cardiology - Dr. Johnsie Cancel - Patient has been seen preoperatively and felt to be stable for surgery.  The patient does not have any contraindications and will receive TXA (tranexamic acid) prior to surgery.  Signed electronically by Joelene Millin, III PA-C

## 2013-12-14 ENCOUNTER — Other Ambulatory Visit (HOSPITAL_COMMUNITY): Payer: Self-pay | Admitting: *Deleted

## 2013-12-14 NOTE — Patient Instructions (Addendum)
Natalie Hall  12/14/2013                           YOUR PROCEDURE IS SCHEDULED ON: 12/21/13               PLEASE REPORT TO SHORT STAY CENTER AT : 5:00 AM               CALL THIS NUMBER IF ANY PROBLEMS THE DAY OF SURGERY :               [phone]                                REMEMBER:   Do not eat food or drink liquids AFTER MIDNIGHT                  Take these medicines the morning of surgery with A SIP OF WATER:  CARDIZEM / CRESTOR / OMEPRAZOLE / CLARITIN   Do not wear jewelry, make-up   Do not wear lotions, powders, or perfumes.   Do not shave legs or underarms 12 hrs. before surgery (men may shave face)  Do not bring valuables to the hospital.  Contacts, dentures or bridgework may not be worn into surgery.  Leave suitcase in the car. After surgery it may be brought to your room.  For patients admitted to the hospital more than one night, checkout time is 11:00  AM                                                       The day of discharge.   Patients discharged the day of surgery will not be allowed to drive home.                If going home same day of surgery, must have someone stay with you            FIRST 24 hrs at home and arrange for some one to drive you home from hospital.    Special Instructions             Please read over the following fact sheets that you were given:               1. [place]               2. INCENTIVE SPIROMETER               3. MRSA INFORMATION SHEET                                                X_____________________________________________________________________        Failure to follow these instructions may result in cancellation of your surgery

## 2013-12-15 ENCOUNTER — Encounter (INDEPENDENT_AMBULATORY_CARE_PROVIDER_SITE_OTHER): Payer: Self-pay

## 2013-12-15 ENCOUNTER — Encounter (HOSPITAL_COMMUNITY)
Admission: RE | Admit: 2013-12-15 | Discharge: 2013-12-15 | Disposition: A | Discharge status: Home / Self Care | Payer: 59 | Source: Ambulatory Visit | Attending: Orthopedic Surgery | Admitting: Orthopedic Surgery

## 2013-12-15 ENCOUNTER — Ambulatory Visit (HOSPITAL_COMMUNITY)
Admission: RE | Admit: 2013-12-15 | Discharge: 2013-12-15 | Disposition: A | Discharge status: Home / Self Care | Payer: 59 | Source: Ambulatory Visit | Attending: Orthopedic Surgery | Admitting: Orthopedic Surgery

## 2013-12-15 ENCOUNTER — Encounter (HOSPITAL_COMMUNITY): Payer: Self-pay

## 2013-12-15 DIAGNOSIS — J45909 Unspecified asthma, uncomplicated: Secondary | ICD-10-CM | POA: Insufficient documentation

## 2013-12-15 DIAGNOSIS — Z01812 Encounter for preprocedural laboratory examination: Secondary | ICD-10-CM | POA: Insufficient documentation

## 2013-12-15 DIAGNOSIS — Z01818 Encounter for other preprocedural examination: Secondary | ICD-10-CM | POA: Insufficient documentation

## 2013-12-15 LAB — SURGICAL PCR SCREEN
MRSA, PCR: NEGATIVE
Staphylococcus aureus: NEGATIVE

## 2013-12-15 LAB — CBC
HEMATOCRIT: 38.9 % (ref 36.0–46.0)
Hemoglobin: 12.5 g/dL (ref 12.0–15.0)
MCH: 27.6 pg (ref 26.0–34.0)
MCHC: 32.1 g/dL (ref 30.0–36.0)
MCV: 85.9 fL (ref 78.0–100.0)
Platelets: 182 10*3/uL (ref 150–400)
RBC: 4.53 MIL/uL (ref 3.87–5.11)
RDW: 13.5 % (ref 11.5–15.5)
WBC: 4.2 10*3/uL (ref 4.0–10.5)

## 2013-12-15 LAB — COMPREHENSIVE METABOLIC PANEL
ALT: 26 U/L (ref 0–35)
AST: 23 U/L (ref 0–37)
Albumin: 3.9 g/dL (ref 3.5–5.2)
Alkaline Phosphatase: 55 U/L (ref 39–117)
BILIRUBIN TOTAL: 0.3 mg/dL (ref 0.3–1.2)
BUN: 13 mg/dL (ref 6–23)
CALCIUM: 9.8 mg/dL (ref 8.4–10.5)
CO2: 26 meq/L (ref 19–32)
CREATININE: 0.56 mg/dL (ref 0.50–1.10)
Chloride: 103 mEq/L (ref 96–112)
Glucose, Bld: 107 mg/dL — ABNORMAL HIGH (ref 70–99)
Potassium: 4.3 mEq/L (ref 3.7–5.3)
Sodium: 140 mEq/L (ref 137–147)
Total Protein: 7.3 g/dL (ref 6.0–8.3)

## 2013-12-15 LAB — URINALYSIS, ROUTINE W REFLEX MICROSCOPIC
BILIRUBIN URINE: NEGATIVE
Glucose, UA: NEGATIVE mg/dL
Ketones, ur: NEGATIVE mg/dL
Leukocytes, UA: NEGATIVE
Nitrite: NEGATIVE
PROTEIN: NEGATIVE mg/dL
Specific Gravity, Urine: 1.004 — ABNORMAL LOW (ref 1.005–1.030)
UROBILINOGEN UA: 0.2 mg/dL (ref 0.0–1.0)
pH: 7.5 (ref 5.0–8.0)

## 2013-12-15 LAB — PROTIME-INR
INR: 0.97 (ref 0.00–1.49)
PROTHROMBIN TIME: 12.7 s (ref 11.6–15.2)

## 2013-12-15 LAB — URINE MICROSCOPIC-ADD ON

## 2013-12-15 LAB — APTT: aPTT: 30 seconds (ref 24–37)

## 2013-12-15 LAB — ABO/RH: ABO/RH(D): A POS

## 2013-12-17 NOTE — Anesthesia Preprocedure Evaluation (Addendum)
Anesthesia Evaluation  Patient identified by MRN, date of birth, ID band Patient awake    Reviewed: Allergy & Precautions, H&P , NPO status , Patient's Chart, lab work & pertinent test results  Airway Mallampati: II TM Distance: >3 FB Neck ROM: Full    Dental no notable dental hx.    Pulmonary  breath sounds clear to auscultation  Pulmonary exam normal       Cardiovascular hypertension, Pt. on medications Rhythm:Regular Rate:Normal     Neuro/Psych negative neurological ROS  negative psych ROS   GI/Hepatic negative GI ROS, Neg liver ROS,   Endo/Other  negative endocrine ROS  Renal/GU negative Renal ROS  negative genitourinary   Musculoskeletal negative musculoskeletal ROS (+)   Abdominal   Peds negative pediatric ROS (+)  Hematology negative hematology ROS (+)   Anesthesia Other Findings   Reproductive/Obstetrics negative OB ROS                          Anesthesia Physical Anesthesia Plan  ASA: II  Anesthesia Plan: Spinal   Post-op Pain Management:    Induction: Intravenous  Airway Management Planned: Simple Face Mask  Additional Equipment:   Intra-op Plan:   Post-operative Plan:   Informed Consent: I have reviewed the patients History and Physical, chart, labs and discussed the procedure including the risks, benefits and alternatives for the proposed anesthesia with the patient or authorized representative who has indicated his/her understanding and acceptance.   Dental advisory given  Plan Discussed with: CRNA and Surgeon  Anesthesia Plan Comments:         Anesthesia Quick Evaluation

## 2013-12-21 ENCOUNTER — Encounter (HOSPITAL_COMMUNITY): Payer: 59 | Admitting: Anesthesiology

## 2013-12-21 ENCOUNTER — Inpatient Hospital Stay (HOSPITAL_COMMUNITY): Payer: 59 | Admitting: Anesthesiology

## 2013-12-21 ENCOUNTER — Inpatient Hospital Stay (HOSPITAL_COMMUNITY)
Admission: RE | Admit: 2013-12-21 | Discharge: 2013-12-23 | DRG: 470 | Disposition: A | Discharge status: Home Health | Payer: 59 | Source: Ambulatory Visit | Attending: Orthopedic Surgery | Admitting: Orthopedic Surgery

## 2013-12-21 ENCOUNTER — Encounter (HOSPITAL_COMMUNITY): Payer: Self-pay | Admitting: *Deleted

## 2013-12-21 ENCOUNTER — Encounter (HOSPITAL_COMMUNITY)
Admission: RE | Disposition: A | Discharge status: Home Health | Payer: Self-pay | Source: Ambulatory Visit | Attending: Orthopedic Surgery

## 2013-12-21 DIAGNOSIS — M179 Osteoarthritis of knee, unspecified: Secondary | ICD-10-CM | POA: Diagnosis present

## 2013-12-21 DIAGNOSIS — J45909 Unspecified asthma, uncomplicated: Secondary | ICD-10-CM | POA: Diagnosis present

## 2013-12-21 DIAGNOSIS — E785 Hyperlipidemia, unspecified: Secondary | ICD-10-CM | POA: Diagnosis present

## 2013-12-21 DIAGNOSIS — Z79899 Other long term (current) drug therapy: Secondary | ICD-10-CM

## 2013-12-21 DIAGNOSIS — M1710 Unilateral primary osteoarthritis, unspecified knee: Principal | ICD-10-CM | POA: Diagnosis present

## 2013-12-21 DIAGNOSIS — E7800 Pure hypercholesterolemia, unspecified: Secondary | ICD-10-CM | POA: Diagnosis present

## 2013-12-21 DIAGNOSIS — M898X9 Other specified disorders of bone, unspecified site: Secondary | ICD-10-CM | POA: Diagnosis present

## 2013-12-21 DIAGNOSIS — Z885 Allergy status to narcotic agent status: Secondary | ICD-10-CM

## 2013-12-21 DIAGNOSIS — K219 Gastro-esophageal reflux disease without esophagitis: Secondary | ICD-10-CM | POA: Diagnosis present

## 2013-12-21 DIAGNOSIS — I10 Essential (primary) hypertension: Secondary | ICD-10-CM | POA: Diagnosis present

## 2013-12-21 DIAGNOSIS — IMO0002 Reserved for concepts with insufficient information to code with codable children: Secondary | ICD-10-CM | POA: Diagnosis present

## 2013-12-21 DIAGNOSIS — Z8249 Family history of ischemic heart disease and other diseases of the circulatory system: Secondary | ICD-10-CM

## 2013-12-21 DIAGNOSIS — Z96652 Presence of left artificial knee joint: Secondary | ICD-10-CM

## 2013-12-21 HISTORY — PX: TOTAL KNEE ARTHROPLASTY: SHX125

## 2013-12-21 LAB — TYPE AND SCREEN
ABO/RH(D): A POS
Antibody Screen: NEGATIVE

## 2013-12-21 SURGERY — ARTHROPLASTY, KNEE, TOTAL
Anesthesia: Spinal | Site: Knee | Laterality: Left

## 2013-12-21 MED ORDER — ONDANSETRON HCL 4 MG/2ML IJ SOLN: 4.0000 mg | Freq: Four times a day (QID) | INTRAMUSCULAR | Status: DC | PRN

## 2013-12-21 MED ORDER — RIVAROXABAN 10 MG PO TABS
10.0000 mg | ORAL_TABLET | Freq: Every day | ORAL | Status: DC
  Administered 2013-12-22 – 2013-12-23 (×2): 10 mg via ORAL
  Filled 2013-12-21 (×3): qty 1

## 2013-12-21 MED ORDER — CEFAZOLIN SODIUM-DEXTROSE 2-3 GM-% IV SOLR
INTRAVENOUS | Status: AC
  Filled 2013-12-21: qty 50

## 2013-12-21 MED ORDER — CEFAZOLIN SODIUM-DEXTROSE 2-3 GM-% IV SOLR
2.0000 g | Freq: Four times a day (QID) | INTRAVENOUS | Status: AC
  Administered 2013-12-21 (×2): 2 g via INTRAVENOUS
  Filled 2013-12-21 (×2): qty 50

## 2013-12-21 MED ORDER — CHLORHEXIDINE GLUCONATE 4 % EX LIQD: 60.0000 mL | Freq: Once | CUTANEOUS | Status: DC

## 2013-12-21 MED ORDER — ACETAMINOPHEN 650 MG RE SUPP: 650.0000 mg | Freq: Four times a day (QID) | RECTAL | Status: DC | PRN

## 2013-12-21 MED ORDER — MIDAZOLAM HCL 5 MG/5ML IJ SOLN
INTRAMUSCULAR | Status: DC | PRN
  Administered 2013-12-21: 2 mg via INTRAVENOUS

## 2013-12-21 MED ORDER — DEXAMETHASONE 6 MG PO TABS
10.0000 mg | ORAL_TABLET | Freq: Every day | ORAL | Status: AC
  Administered 2013-12-22: 10 mg via ORAL
  Filled 2013-12-21: qty 1

## 2013-12-21 MED ORDER — DEXAMETHASONE SODIUM PHOSPHATE 10 MG/ML IJ SOLN
INTRAMUSCULAR | Status: AC
  Filled 2013-12-21: qty 1

## 2013-12-21 MED ORDER — DOCUSATE SODIUM 100 MG PO CAPS
100.0000 mg | ORAL_CAPSULE | Freq: Two times a day (BID) | ORAL | Status: DC
  Administered 2013-12-21 – 2013-12-23 (×5): 100 mg via ORAL

## 2013-12-21 MED ORDER — PROPOFOL 10 MG/ML IV BOLUS
INTRAVENOUS | Status: AC
  Filled 2013-12-21: qty 20

## 2013-12-21 MED ORDER — BUPIVACAINE IN DEXTROSE 0.75-8.25 % IT SOLN
INTRATHECAL | Status: DC | PRN
  Administered 2013-12-21: 1.7 mL via INTRATHECAL

## 2013-12-21 MED ORDER — SODIUM CHLORIDE 0.9 % IJ SOLN
INTRAMUSCULAR | Status: AC
  Filled 2013-12-21: qty 50

## 2013-12-21 MED ORDER — 0.9 % SODIUM CHLORIDE (POUR BTL) OPTIME
TOPICAL | Status: DC | PRN
  Administered 2013-12-21: 1000 mL

## 2013-12-21 MED ORDER — ACETAMINOPHEN 500 MG PO TABS
1000.0000 mg | ORAL_TABLET | Freq: Four times a day (QID) | ORAL | Status: AC
  Administered 2013-12-21 – 2013-12-22 (×4): 1000 mg via ORAL
  Filled 2013-12-21 (×4): qty 2

## 2013-12-21 MED ORDER — SODIUM CHLORIDE 0.9 % IR SOLN
Status: DC | PRN
  Administered 2013-12-21: 1000 mL

## 2013-12-21 MED ORDER — MORPHINE SULFATE 2 MG/ML IJ SOLN
1.0000 mg | INTRAMUSCULAR | Status: DC | PRN
  Administered 2013-12-21: 1 mg via INTRAVENOUS
  Administered 2013-12-22 (×2): 2 mg via INTRAVENOUS
  Filled 2013-12-21 (×3): qty 1

## 2013-12-21 MED ORDER — MIDAZOLAM HCL 2 MG/2ML IJ SOLN
INTRAMUSCULAR | Status: AC
  Filled 2013-12-21: qty 2

## 2013-12-21 MED ORDER — ALBUTEROL SULFATE (2.5 MG/3ML) 0.083% IN NEBU: 2.5000 mg | INHALATION_SOLUTION | RESPIRATORY_TRACT | Status: DC | PRN

## 2013-12-21 MED ORDER — TRANEXAMIC ACID 100 MG/ML IV SOLN
1000.0000 mg | INTRAVENOUS | Status: AC
  Administered 2013-12-21: 1000 mg via INTRAVENOUS
  Filled 2013-12-21: qty 10

## 2013-12-21 MED ORDER — LACTATED RINGERS IV SOLN
INTRAVENOUS | Status: DC | PRN
  Administered 2013-12-21 (×2): via INTRAVENOUS

## 2013-12-21 MED ORDER — TRAMADOL HCL 50 MG PO TABS
50.0000 mg | ORAL_TABLET | Freq: Four times a day (QID) | ORAL | Status: DC | PRN
  Administered 2013-12-22: 100 mg via ORAL
  Filled 2013-12-21: qty 2

## 2013-12-21 MED ORDER — PROPOFOL INFUSION 10 MG/ML OPTIME
INTRAVENOUS | Status: DC | PRN
  Administered 2013-12-21: 75 ug/kg/min via INTRAVENOUS

## 2013-12-21 MED ORDER — BUPIVACAINE HCL 0.25 % IJ SOLN
INTRAMUSCULAR | Status: DC | PRN
  Administered 2013-12-21: 20 mL

## 2013-12-21 MED ORDER — NIACIN ER (ANTIHYPERLIPIDEMIC) 500 MG PO TBCR
500.0000 mg | EXTENDED_RELEASE_TABLET | Freq: Every day | ORAL | Status: DC
  Administered 2013-12-21 – 2013-12-22 (×2): 500 mg via ORAL
  Filled 2013-12-21 (×3): qty 1

## 2013-12-21 MED ORDER — ACETAMINOPHEN 10 MG/ML IV SOLN
1000.0000 mg | Freq: Once | INTRAVENOUS | Status: DC
  Filled 2013-12-21: qty 100

## 2013-12-21 MED ORDER — SODIUM CHLORIDE 0.9 % IJ SOLN
INTRAMUSCULAR | Status: DC | PRN
  Administered 2013-12-21: 30 mL

## 2013-12-21 MED ORDER — BUPIVACAINE LIPOSOME 1.3 % IJ SUSP
INTRAMUSCULAR | Status: DC | PRN
  Administered 2013-12-21: 20 mL

## 2013-12-21 MED ORDER — BUPIVACAINE HCL (PF) 0.25 % IJ SOLN
INTRAMUSCULAR | Status: AC
  Filled 2013-12-21: qty 30

## 2013-12-21 MED ORDER — FLUTICASONE PROPIONATE 50 MCG/ACT NA SUSP
2.0000 | Freq: Every day | NASAL | Status: DC
  Filled 2013-12-21: qty 16

## 2013-12-21 MED ORDER — BISACODYL 10 MG RE SUPP: 10.0000 mg | Freq: Every day | RECTAL | Status: DC | PRN

## 2013-12-21 MED ORDER — SODIUM CHLORIDE 0.9 % IV SOLN
INTRAVENOUS | Status: DC
  Administered 2013-12-21 – 2013-12-22 (×2): via INTRAVENOUS

## 2013-12-21 MED ORDER — SODIUM CHLORIDE 0.9 % IV SOLN
INTRAVENOUS | Status: DC
Start: 2013-12-21 — End: 2013-12-21

## 2013-12-21 MED ORDER — ONDANSETRON HCL 4 MG/2ML IJ SOLN
INTRAMUSCULAR | Status: AC
  Filled 2013-12-21: qty 2

## 2013-12-21 MED ORDER — FLEET ENEMA 7-19 GM/118ML RE ENEM: 1.0000 | ENEMA | Freq: Once | RECTAL | Status: AC | PRN

## 2013-12-21 MED ORDER — ACETAMINOPHEN 10 MG/ML IV SOLN
INTRAVENOUS | Status: DC | PRN
  Administered 2013-12-21: 1000 mg via INTRAVENOUS

## 2013-12-21 MED ORDER — PROMETHAZINE HCL 25 MG/ML IJ SOLN: 6.2500 mg | INTRAMUSCULAR | Status: DC | PRN

## 2013-12-21 MED ORDER — ONDANSETRON HCL 4 MG/2ML IJ SOLN
INTRAMUSCULAR | Status: DC | PRN
Start: 2013-12-21 — End: 2013-12-21
  Administered 2013-12-21: 4 mg via INTRAVENOUS

## 2013-12-21 MED ORDER — METOCLOPRAMIDE HCL 10 MG PO TABS: 5.0000 mg | ORAL_TABLET | Freq: Three times a day (TID) | ORAL | Status: DC | PRN

## 2013-12-21 MED ORDER — BUPIVACAINE LIPOSOME 1.3 % IJ SUSP
20.0000 mL | Freq: Once | INTRAMUSCULAR | Status: DC
  Filled 2013-12-21: qty 20

## 2013-12-21 MED ORDER — RAMIPRIL 5 MG PO CAPS
5.0000 mg | ORAL_CAPSULE | Freq: Every day | ORAL | Status: DC
  Administered 2013-12-21 – 2013-12-23 (×3): 5 mg via ORAL
  Filled 2013-12-21 (×3): qty 1

## 2013-12-21 MED ORDER — MENTHOL 3 MG MT LOZG
1.0000 | LOZENGE | OROMUCOSAL | Status: DC | PRN
  Filled 2013-12-21: qty 9

## 2013-12-21 MED ORDER — DEXAMETHASONE SODIUM PHOSPHATE 10 MG/ML IJ SOLN
10.0000 mg | Freq: Every day | INTRAMUSCULAR | Status: AC
  Filled 2013-12-21: qty 1

## 2013-12-21 MED ORDER — METOCLOPRAMIDE HCL 5 MG/ML IJ SOLN: 5.0000 mg | Freq: Three times a day (TID) | INTRAMUSCULAR | Status: DC | PRN

## 2013-12-21 MED ORDER — DEXAMETHASONE SODIUM PHOSPHATE 10 MG/ML IJ SOLN: 10.0000 mg | Freq: Once | INTRAMUSCULAR | Status: DC

## 2013-12-21 MED ORDER — DIPHENHYDRAMINE HCL 12.5 MG/5ML PO ELIX
12.5000 mg | ORAL_SOLUTION | ORAL | Status: DC | PRN
Start: 2013-12-21 — End: 2013-12-23
  Administered 2013-12-21: 25 mg via ORAL
  Filled 2013-12-21: qty 10

## 2013-12-21 MED ORDER — LORATADINE 10 MG PO TABS
10.0000 mg | ORAL_TABLET | Freq: Every day | ORAL | Status: DC
  Administered 2013-12-22 – 2013-12-23 (×2): 10 mg via ORAL
  Filled 2013-12-21 (×2): qty 1

## 2013-12-21 MED ORDER — FENTANYL CITRATE 0.05 MG/ML IJ SOLN
INTRAMUSCULAR | Status: AC
  Filled 2013-12-21: qty 2

## 2013-12-21 MED ORDER — METHOCARBAMOL 100 MG/ML IJ SOLN
500.0000 mg | Freq: Four times a day (QID) | INTRAVENOUS | Status: DC | PRN
  Administered 2013-12-21 – 2013-12-22 (×2): 500 mg via INTRAVENOUS
  Filled 2013-12-21 (×2): qty 5

## 2013-12-21 MED ORDER — ACETAMINOPHEN 325 MG PO TABS: 650.0000 mg | ORAL_TABLET | Freq: Four times a day (QID) | ORAL | Status: DC | PRN

## 2013-12-21 MED ORDER — PHENOL 1.4 % MT LIQD
1.0000 | OROMUCOSAL | Status: DC | PRN
  Filled 2013-12-21: qty 177

## 2013-12-21 MED ORDER — OXYCODONE HCL 5 MG PO TABS
5.0000 mg | ORAL_TABLET | ORAL | Status: DC | PRN
  Administered 2013-12-21 – 2013-12-23 (×13): 10 mg via ORAL
  Filled 2013-12-21 (×12): qty 2
  Filled 2013-12-21: qty 1
  Filled 2013-12-21: qty 2

## 2013-12-21 MED ORDER — FENTANYL CITRATE 0.05 MG/ML IJ SOLN
INTRAMUSCULAR | Status: DC | PRN
  Administered 2013-12-21 (×2): 50 ug via INTRAVENOUS

## 2013-12-21 MED ORDER — DILTIAZEM HCL 60 MG PO TABS
120.0000 mg | ORAL_TABLET | Freq: Every morning | ORAL | Status: DC
  Administered 2013-12-22 – 2013-12-23 (×2): 120 mg via ORAL
  Filled 2013-12-21 (×2): qty 2

## 2013-12-21 MED ORDER — POLYETHYLENE GLYCOL 3350 17 G PO PACK: 17.0000 g | PACK | Freq: Every day | ORAL | Status: DC | PRN

## 2013-12-21 MED ORDER — PANTOPRAZOLE SODIUM 40 MG PO TBEC
80.0000 mg | DELAYED_RELEASE_TABLET | Freq: Every day | ORAL | Status: DC
  Administered 2013-12-22: 80 mg via ORAL
  Filled 2013-12-21: qty 2

## 2013-12-21 MED ORDER — METHOCARBAMOL 500 MG PO TABS
500.0000 mg | ORAL_TABLET | Freq: Four times a day (QID) | ORAL | Status: DC | PRN
  Administered 2013-12-21 – 2013-12-23 (×5): 500 mg via ORAL
  Filled 2013-12-21 (×7): qty 1

## 2013-12-21 MED ORDER — LACTATED RINGERS IV SOLN: INTRAVENOUS | Status: DC

## 2013-12-21 MED ORDER — PROPOFOL 10 MG/ML IV BOLUS
INTRAVENOUS | Status: DC | PRN
  Administered 2013-12-21 (×3): 20 mg via INTRAVENOUS

## 2013-12-21 MED ORDER — HYDROMORPHONE HCL PF 1 MG/ML IJ SOLN: 0.2500 mg | INTRAMUSCULAR | Status: DC | PRN

## 2013-12-21 MED ORDER — KETOROLAC TROMETHAMINE 15 MG/ML IJ SOLN
7.5000 mg | Freq: Four times a day (QID) | INTRAMUSCULAR | Status: AC | PRN
  Administered 2013-12-22: 7.5 mg via INTRAVENOUS
  Filled 2013-12-21: qty 1

## 2013-12-21 MED ORDER — STERILE WATER FOR IRRIGATION IR SOLN
Status: DC | PRN
  Administered 2013-12-21: 3000 mL

## 2013-12-21 MED ORDER — CEFAZOLIN SODIUM-DEXTROSE 2-3 GM-% IV SOLR
2.0000 g | INTRAVENOUS | Status: AC
  Administered 2013-12-21: 2 g via INTRAVENOUS

## 2013-12-21 MED ORDER — ONDANSETRON HCL 4 MG PO TABS: 4.0000 mg | ORAL_TABLET | Freq: Four times a day (QID) | ORAL | Status: DC | PRN

## 2013-12-21 MED ORDER — DEXAMETHASONE SODIUM PHOSPHATE 10 MG/ML IJ SOLN
INTRAMUSCULAR | Status: DC | PRN
  Administered 2013-12-21: 10 mg via INTRAVENOUS

## 2013-12-21 SURGICAL SUPPLY — 55 items
BAG SPEC THK2 15X12 ZIP CLS (MISCELLANEOUS) ×1
BAG ZIPLOCK 12X15 (MISCELLANEOUS) ×2 IMPLANT
BANDAGE ELASTIC 6 VELCRO ST LF (GAUZE/BANDAGES/DRESSINGS) ×2 IMPLANT
BANDAGE ESMARK 6X9 LF (GAUZE/BANDAGES/DRESSINGS) ×1 IMPLANT
BLADE SAG 18X100X1.27 (BLADE) ×2 IMPLANT
BLADE SAW SGTL 11.0X1.19X90.0M (BLADE) ×2 IMPLANT
BNDG CMPR 9X6 STRL LF SNTH (GAUZE/BANDAGES/DRESSINGS) ×1
BNDG ESMARK 6X9 LF (GAUZE/BANDAGES/DRESSINGS) ×2
BOWL SMART MIX CTS (DISPOSABLE) ×2 IMPLANT
CAPT RP KNEE ×1 IMPLANT
CEMENT HV SMART SET (Cement) ×4 IMPLANT
CUFF TOURN SGL QUICK 34 (TOURNIQUET CUFF) ×2
CUFF TRNQT CYL 34X4X40X1 (TOURNIQUET CUFF) ×1 IMPLANT
DECANTER SPIKE VIAL GLASS SM (MISCELLANEOUS) ×2 IMPLANT
DRAPE EXTREMITY T 121X128X90 (DRAPE) ×2 IMPLANT
DRAPE POUCH INSTRU U-SHP 10X18 (DRAPES) ×2 IMPLANT
DRAPE U-SHAPE 47X51 STRL (DRAPES) ×2 IMPLANT
DRSG ADAPTIC 3X8 NADH LF (GAUZE/BANDAGES/DRESSINGS) ×2 IMPLANT
DURAPREP 26ML APPLICATOR (WOUND CARE) ×2 IMPLANT
ELECT REM PT RETURN 9FT ADLT (ELECTROSURGICAL) ×2
ELECTRODE REM PT RTRN 9FT ADLT (ELECTROSURGICAL) ×1 IMPLANT
EVACUATOR 1/8 PVC DRAIN (DRAIN) ×2 IMPLANT
FACESHIELD LNG OPTICON STERILE (SAFETY) ×10 IMPLANT
GLOVE BIO SURGEON STRL SZ7.5 (GLOVE) IMPLANT
GLOVE BIO SURGEON STRL SZ8 (GLOVE) ×2 IMPLANT
GLOVE BIOGEL PI IND STRL 8 (GLOVE) ×2 IMPLANT
GLOVE BIOGEL PI INDICATOR 8 (GLOVE) ×2
GLOVE SURG SS PI 6.5 STRL IVOR (GLOVE) IMPLANT
GOWN STRL REUS W/TWL LRG LVL3 (GOWN DISPOSABLE) ×2 IMPLANT
GOWN STRL REUS W/TWL XL LVL3 (GOWN DISPOSABLE) IMPLANT
HANDPIECE INTERPULSE COAX TIP (DISPOSABLE) ×2
IMMOBILIZER KNEE 20 (SOFTGOODS) ×2 IMPLANT
KIT BASIN OR (CUSTOM PROCEDURE TRAY) ×2 IMPLANT
MANIFOLD NEPTUNE II (INSTRUMENTS) ×2 IMPLANT
NDL SAFETY ECLIPSE 18X1.5 (NEEDLE) ×2 IMPLANT
NEEDLE HYPO 18GX1.5 SHARP (NEEDLE) ×4
NS IRRIG 1000ML POUR BTL (IV SOLUTION) ×2 IMPLANT
PACK TOTAL JOINT (CUSTOM PROCEDURE TRAY) ×2 IMPLANT
PAD ABD 8X10 STRL (GAUZE/BANDAGES/DRESSINGS) ×2 IMPLANT
PADDING CAST COTTON 6X4 STRL (CAST SUPPLIES) ×5 IMPLANT
POSITIONER SURGICAL ARM (MISCELLANEOUS) ×2 IMPLANT
SET HNDPC FAN SPRY TIP SCT (DISPOSABLE) ×1 IMPLANT
SPONGE GAUZE 4X4 12PLY (GAUZE/BANDAGES/DRESSINGS) ×2 IMPLANT
STRIP CLOSURE SKIN 1/2X4 (GAUZE/BANDAGES/DRESSINGS) ×4 IMPLANT
SUCTION FRAZIER 12FR DISP (SUCTIONS) ×2 IMPLANT
SUT MNCRL AB 4-0 PS2 18 (SUTURE) ×2 IMPLANT
SUT VIC AB 2-0 CT1 27 (SUTURE) ×6
SUT VIC AB 2-0 CT1 TAPERPNT 27 (SUTURE) ×3 IMPLANT
SUT VLOC 180 0 24IN GS25 (SUTURE) ×2 IMPLANT
SYR 20CC LL (SYRINGE) ×2 IMPLANT
SYR 50ML LL SCALE MARK (SYRINGE) ×2 IMPLANT
TOWEL OR 17X26 10 PK STRL BLUE (TOWEL DISPOSABLE) ×4 IMPLANT
TRAY FOLEY CATH 14FRSI W/METER (CATHETERS) ×2 IMPLANT
WATER STERILE IRR 1500ML POUR (IV SOLUTION) ×2 IMPLANT
WRAP KNEE MAXI GEL POST OP (GAUZE/BANDAGES/DRESSINGS) ×2 IMPLANT

## 2013-12-21 NOTE — Anesthesia Postprocedure Evaluation (Signed)
  Anesthesia Post-op Note  Patient: Natalie Hall  Procedure(s) Performed: Procedure(s) (LRB): LEFT TOTAL KNEE ARTHROPLASTY (Left)  Patient Location: PACU  Anesthesia Type: Spinal  Level of Consciousness: awake and alert   Airway and Oxygen Therapy: Patient Spontanous Breathing  Post-op Pain: mild  Post-op Assessment: Post-op Vital signs reviewed, Patient's Cardiovascular Status Stable, Respiratory Function Stable, Patent Airway and No signs of Nausea or vomiting  Last Vitals:  Filed Vitals:   12/21/13 0506  BP: 148/72  Pulse: 83  Temp: 36.5 C  Resp: 18    Post-op Vital Signs: stable   Complications: No apparent anesthesia complications

## 2013-12-21 NOTE — Interval H&P Note (Signed)
History and Physical Interval Note:  12/21/2013 6:47 AM  Natalie Hall  has presented today for surgery, with the diagnosis of osteoarthritis of the left knee  The various methods of treatment have been discussed with the patient and family. After consideration of risks, benefits and other options for treatment, the patient has consented to  Procedure(s): LEFT TOTAL KNEE ARTHROPLASTY (Left) as a surgical intervention .  The patient's history has been reviewed, patient examined, no change in status, stable for surgery.  I have reviewed the patient's chart and labs.  Questions were answered to the patient's satisfaction.     Gearlean Alf

## 2013-12-21 NOTE — Op Note (Signed)
Pre-operative diagnosis- Osteoarthritis Left knee(s)  Post-operative diagnosis- Osteoarthritis  Left knee(s)  Procedure-   Left Total Knee Arthroplasty  Surgeon- Natalie Plover. Kadeen Sroka, MD  Assistant- Amber constable, PA-C   Anesthesia-  Spinal   EBL- * No blood loss amount entered *   Drains Hemovac   Tourniquet time  Total Tourniquet Time Documented: Thigh (Left) - 38 minutes Total: Thigh (Left) - 38 minutes    Complications- None  Condition-PACU - hemodynamically stable.   Brief Clinical Note  Natalie Hall is a 63 y.o. year old female with end stage OA of her left knee with progressively worsening pain and dysfunction. She has constant pain, with activity and at rest and significant functional deficits with difficulties even with ADLs. She has had extensive non-op management including analgesics, injections of cortisone and viscosupplements, and home exercise program, but remains in significant pain with significant dysfunction. Radiographs show bone on bone arthritis lateral and patellofemoral with large valgus deformity. She presents now for left Total Knee Arthroplasty.   Procedure in detail---       The patient is brought into the operating room and positioned supine on the operating table. After successful administration of Spinal anesthetic, a tourniquet is placed high on the Left thigh(s) and the lower extremity is prepped and draped in the usual sterile fashion. Time out is performed by the operating team and then the Left  lower extremity is wrapped in Esmarch, knee flexed and the tourniquet inflated to 300 mmHg.       A midline incision is made with a ten blade through the subcutaneous tissue to the level of the extensor mechanism. A fresh blade is used to make a lateral parapatellar arthrotomy due to the patients' valgus deformity. Soft tissue over the proximal lateral tibia is subperiosteally elevated to the joint line with a knife to the posterolateral corner but not  including the structures of the posterolateral corner. Soft tissue over the proximal medial tibia is elevated with attention being paid to avoiding the patellar tendon on the tibial tubercle. The patella is everted medially, knee flexed 90 degrees and the ACL and PCL are removed. Findings are bone on bone lateral and patellofemoral with large lateral and patellar osteophytes. .       The drill is used to create a starting hole in the distal femur and the canal is thoroughly irrigated with sterile saline to remove the fatty contents. The 5 degree Left  valgus alignment guide is placed into the femoral canal and the distal femoral cutting block is pinned to remove 10  mm off the distal femur. Resection is made with an oscillating saw.      The tibia is subluxed forward and the menisci are removed. The extramedullary alignment guide is placed referencing proximally at the medial aspect of the tibial tubercle and distally along the second metatarsal axis and tibial crest. The block is pinned to remove 32mm off the more deficient lateral side. Resection is made with an oscillating saw. Size 3  is the most appropriate size for the tibia and the proximal tibia is prepared with the modular drill and keel punch for that size.      The femoral sizing guide is placed and size 3  is most appropriate. Rotation is marked off the epicondylar axis and confirmed by creating a rectangular flexion gap at 90 degrees. The size 3  cutting block is pinned in this rotation and the anterior, posterior and chamfer cuts are made with the  oscillating saw. The intercondylar block is then placed and that cut is made.      Trial size 3  tibial component, trial size 3  posterior stabilized femur and a 10  mm posterior stabilized rotating platform insert trial is placed. Full extension is achieved with excellent varus/valgus and   anterior/posterior balance throughout full range of motion. The patella is everted and thickness measured to be 22   mm. Free hand resection is taken to 12 mm, a 35 template is placed, lug holes are drilled, trial patella is placed, and it tracks normally. Osteophytes are removed off the posterior femur with the trial in place. All trials are removed and the cut bone surfaces prepared with pulsatile lavage. Cement is mixed and once ready for implantation, the size 3  tibial implant, size 3 posterior stabilized femoral component, and the size 35  patella are cemented in place and the patella is held with the clamp. The trial insert is placed and the knee held in full extension. The Exparel (20 ml mixed with 30 ml saline) and then 20 ml of .25% Bupivicaine is injected into the extensor mechanism, posterior capsule, medial and lateral gutters and subcutaneous tissues. All extruded cement is removed and once the cement is hard the permanent 10  mm posterior stabilized rotating platform insert is placed into the tibial tray.      The wound is copiously irrigated with saline solution and the tourniquet is released for a total   tourniquet time of 38  minutes. Bleeding is identified and controlled with electrocautery. The extensor mechanism is closed with interrupted #1 PDS leaving open a small area from the superior to inferior pole of the patella to serve as a mini lateral release. Flexion against gravity is 140  degrees and the patella tracks normally. Subcutaneous tissue is closed with 2.0 vicryl and subcuticular with running 4.0 Monocryl.The incision is cleaned and dried and steri-strips and a bulky sterile dressing are applied. The limb is placed into a knee immobilizer and the patient is awakened and transported to recovery in stable condition.      Please note that a surgical assistant was a medical necessity for this procedure in order to perform it in a safe and expeditious manner. Surgical assistant was necessary to retract the ligaments and vital neurovascular structures to prevent injury to them and also necessary for  proper positioning of the limb to allow for anatomic placement of the prosthesis.    Natalie Plover [name], MD    12/21/2013, 8:16 AM

## 2013-12-21 NOTE — Transfer of Care (Signed)
Immediate Anesthesia Transfer of Care Note  Patient: Natalie Hall  Procedure(s) Performed: Procedure(s) (LRB): LEFT TOTAL KNEE ARTHROPLASTY (Left)  Patient Location: PACU  Anesthesia Type: Spinal  Level of Consciousness: sedated, patient cooperative and responds to stimulation  Airway & Oxygen Therapy: Patient Spontanous Breathing and Patient connected to face mask oxgen  Post-op Assessment: Report given to PACU RN and Post -op Vital signs reviewed and stable  Post vital signs: Reviewed and stable, T-12 on release to PACU staff, denied pain.   Complications: No apparent anesthesia complications

## 2013-12-21 NOTE — Evaluation (Signed)
Physical Therapy Evaluation Patient Details Name: Natalie Hall MRN: [id number] DOB: 26-Dec-1950 Today's Date: 12/21/2013 Time: 3474-2595 PT Time Calculation (min): 23 min  PT Assessment / Plan / Recommendation History of Present Illness  L TKA  Clinical Impression  *Pt is s/p TKA resulting in the deficits listed below (see PT Problem List). * Pt will benefit from skilled PT to increase their independence and safety with mobility to allow discharge to the venue listed below.  **    PT Assessment  Patient needs continued PT services    Follow Up Recommendations  Home health PT    Does the patient have the potential to tolerate intense rehabilitation      Barriers to Discharge        Equipment Recommendations  None recommended by PT    Recommendations for Other Services     Frequency 7X/week    Precautions / Restrictions Precautions Precautions: Knee Required Braces or Orthoses: Knee Immobilizer - Left Knee Immobilizer - Left: Discontinue once straight leg raise with < 10 degree lag Restrictions Weight Bearing Restrictions: No Other Position/Activity Restrictions: WBAT   Pertinent Vitals/Pain **8/10 "pressure" in L knee Premedicated, ice applied*      Mobility  Bed Mobility Overal bed mobility: Needs Assistance Bed Mobility: Supine to Sit Supine to sit: Min assist General bed mobility comments: min A to raise trunk and support LLE, VCs for hand placement and technique Transfers Overall transfer level: Needs assistance Equipment used: Rolling walker (2 wheeled) Transfers: Sit to/from Stand Sit to Stand: Min assist General transfer comment: from elevated bed, VCs hand placement Ambulation/Gait Ambulation/Gait assistance: Min assist Ambulation Distance (Feet): 4 Feet Assistive device: Rolling walker (2 wheeled) Gait Pattern/deviations: Step-to pattern;Decreased step length - left;Decreased stance time - left;Antalgic Gait velocity: decr General Gait Details: VCs  for sequencing, min A to maneuver RW    Exercises Total Joint Exercises Ankle Circles/Pumps: AROM;Both;10 reps;Supine Quad Sets: AROM;Both;5 reps;Supine Heel Slides: AAROM;Left;10 reps;Supine Goniometric ROM: 35* flexion L knee AAROM   PT Diagnosis: Difficulty walking;Acute pain  PT Problem List: Decreased strength;Decreased range of motion;Decreased activity tolerance;Decreased mobility;Pain;Decreased knowledge of use of DME PT Treatment Interventions: Functional mobility training;Stair training;Gait training;DME instruction;Therapeutic activities;Therapeutic exercise;Patient/family education     PT Goals(Current goals can be found in the care plan section) Acute Rehab PT Goals 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 [employer] PT Goal Formulation: With patient/family Time For Goal Achievement: 01/04/14 Potential to Achieve Goals: Good  Visit Information  Last PT Received On: 12/21/13 Assistance Needed: +1 History of Present Illness: L TKA       Prior [place] expects to be discharged to:: Private residence Living Arrangements: Spouse/significant other Available Help at Discharge: Family;Available 24 hours/day Type of Home: House Home Access: Stairs to enter;Ramped entrance Entrance Stairs-Number of Steps: 5 Entrance Stairs-Rails: Right Home Layout: One level Home Equipment: Walker - 2 wheels;Shower seat;Transport chair;Bedside commode;Cane - single point;Hand held shower head Prior Function Level of Independence: Independent Communication Communication: No difficulties    Cognition  Cognition Arousal/Alertness: Awake/alert Behavior During Therapy: WFL for tasks assessed/performed Overall Cognitive Status: Within Functional Limits for tasks assessed    Extremity/Trunk Assessment Upper Extremity Assessment Upper Extremity Assessment: Overall WFL for tasks assessed Lower Extremity Assessment Lower Extremity  Assessment: LLE deficits/detail LLE Deficits / Details: knee flexion AAROM 35*, SLR 2/5, ankle WNL Cervical / Trunk Assessment Cervical / Trunk Assessment: Normal   Balance Balance Overall balance assessment: Needs assistance  Sitting-balance support: Bilateral upper extremity supported;Feet supported Sitting balance-Leahy Scale: Good Standing balance-Leahy Scale: Poor  End of Session PT - End of Session Equipment Utilized During Treatment: Gait belt Activity Tolerance: Patient limited by fatigue;Patient limited by pain Patient left: in chair;with call bell/phone within reach;with family/visitor present Nurse Communication: Mobility status CPM Left Knee CPM Left Knee: Off  GP     [name] 12/21/2013, 1:48 PM [phone]

## 2013-12-21 NOTE — H&P (View-Only) (Signed)
Natalie Hall. Llerena  DOB: February 04, 1951 Married / Language: English / Race: White Female  Date of Admission:  12-21-2013  Chief Complaint:  Left Knee Pain  History of Present Illness The patient is a 63 year old female who comes in for a preoperative History and Physical. The patient is scheduled for a left total knee arthroplasty to be performed by Dr. Dione Plover. Aluisio, MD at [facility] on 12-21-2013. The patient is a 63 year old female who presents today for follow up of their knee. The patient is being followed for their left knee pain and osteoarthritis. They are now several week(s) out from Synvisc injections. Symptoms reported today include: pain and swelling. The following medication has been used for pain control: Aleve. Note for "Follow-up Knee": Patient states that the injection only  helped some. She is still having swelling and pain. The patient states that the Synvisc provided minimal benefit. Even though it did help some with the pain, she is still having a lot of functional issues. She is unable to get around and do what she desires. The knee feels like it wants to give out on her. She has had a progressive deformity of the knee. She is now ready to get the knee fixed. They have been treated conservatively in the past for the above stated problem and despite conservative measures, they continue to have progressive pain and severe functional limitations and dysfunction. They have failed non-operative management including home exercise, medications, and injections. It is felt that they would benefit from undergoing total joint replacement. Risks and benefits of the procedure have been discussed with the patient and they elect to proceed with surgery. There are no active contraindications to surgery such as ongoing infection or rapidly progressive neurological disease.  Allergies Codeine Phosphate *ANALGESICS - OPIOID*. causes flushing **Please note that she IS able to  take hydrocodone.**  Problem List/Past Medical Primary osteoarthritis of one knee (715.16) Asthma Gastroesophageal Reflux Disease Hypercholesterolemia High blood pressure Osteoarthritis Cataract. Post Surgery Bilateral Heart murmur. Childhood Varicose veins Hiatal Hernia Degenerative Disc Disease Measles Mumps Rubella   Family History Kidney disease. Father. father Cerebrovascular Accident. First Degree Relatives. father Heart Disease. father and grandfather fathers side Cancer. grandmother mothers side, grandfather mothers side and grandmother fathers side Diabetes Mellitus. mother, father and grandmother mothers side Hypertension. mother, father and grandmother fathers side Father. Deceased. age 17 Mother. In good health. age 88   Social History Tobacco / smoke exposure. no Number of flights of stairs before winded. 1 Marital status. married Exercise. Exercises rarely; does running / walking Pain Contract. no Alcohol use. never consumed alcohol Children. 1 Living situation. live with spouse Current work status. working full time Tobacco use. Never smoker. never smoker Illicit drug use. no Drug/Alcohol Rehab (Currently). no Drug/Alcohol Rehab (Previously). no Post-Surgical Plans. Home   Medication History Diltiazem HCl ER Beads (180MG  Capsule ER 24HR, Oral) Active. Ramipril (5MG  Capsule, Oral) Active. Crestor (10MG  Tablet, Oral) Active. Omeprazole (40MG  Capsule DR, Oral) Active. Niaspan (500MG  Tablet ER, Oral) Active. Multivitamin ( Oral) Active. Vitamin D3 (2000UNIT Capsule, Oral) Active. Calcium Carbonate (1250MG  Tablet, Oral) Active. Loratadine (10MG  Tablet, Oral) Active. Nasacort AQ (55MCG/ACT Aerosol Soln, Nasal) Active. Ventolin HFA (108 (90 Base)MCG/ACT Aerosol Soln, Inhalation) Active.   Past Surgical History Hysterectomy. partial (non-cancerous) Cesarean Delivery. 1 time Foot Surgery. Date: 2004. left Ankle  Surgery. Date: 2012. right - Achilles Tendon Repair Colon Polyp Removal - Colonoscopy Carpal Tunnel Repair. bilateral; 1998 and 2002 Breast Biopsy. left Gallbladder  Surgery. laporoscopic; 1996   Review of Systems General:Not Present- Chills, Fever, Night Sweats, Fatigue, Weight Gain, Weight Loss and Memory Loss. Skin:Not Present- Hives, Itching, Rash, Eczema and Lesions. HEENT:Present- Tinnitus. Not Present- Headache, Double Vision, Visual Loss, Hearing Loss and Dentures. Respiratory:Not Present- Shortness of breath with exertion, Shortness of breath at rest, Allergies, Coughing up blood and Chronic Cough. Cardiovascular:Present- Palpitations. Not Present- Chest Pain, Racing/skipping heartbeats, Difficulty Breathing Lying Down, Murmur and Swelling. Gastrointestinal:Not Present- Bloody Stool, Heartburn, Abdominal Pain, Vomiting, Nausea, Constipation, Diarrhea, Difficulty Swallowing, Jaundice and Loss of appetitie. Female Genitourinary:Not Present- Blood in Urine, Urinary frequency, Weak urinary stream, Discharge, Flank Pain, Incontinence, Painful Urination, Urgency, Urinary Retention and Urinating at Night. Musculoskeletal:Present- Joint Swelling, Joint Pain, Back Pain and Morning Stiffness. Not Present- Muscle Weakness, Muscle Pain and Spasms. Neurological:Not Present- Tremor, Dizziness, Blackout spells, Paralysis, Difficulty with balance and Weakness. Psychiatric:Not Present- Insomnia.    Vitals Weight: 215 lb Height: 65 in Weight was reported by patient. Height was reported by patient. Body Surface Area: 2.11 m Body Mass Index: 35.78 kg/m Pulse: 80 (Regular) Resp.: 14 (Unlabored) BP: 152/86 (Sitting, Right Arm, Standard)     Physical Exam The physical exam findings are as follows:   General Mental Status - Alert, cooperative and good historian. General Appearance- pleasant. Not in acute distress. Orientation- Oriented X3. Build &  Nutrition- Well nourished and Well developed.   Head and Neck Head- normocephalic, atraumatic . Neck Global Assessment- supple. no bruit auscultated on the right and no bruit auscultated on the left.   Eye Pupil- Bilateral- Regular and Round. Motion- Bilateral- EOMI.   Chest and Lung Exam Auscultation: Breath sounds:- clear at anterior chest Haggart and - clear at posterior chest Drost. Adventitious sounds:- No Adventitious sounds.   Cardiovascular Auscultation:Rhythm- Regular rate and rhythm. Heart Sounds- S1 WNL and S2 WNL. Murmurs & Other Heart Sounds:Auscultation of the heart reveals - No Murmurs.   Abdomen Inspection:Contour- Generalized mild distention. Palpation/Percussion:Tenderness- Abdomen is non-tender to palpation. Rigidity (guarding)- Abdomen is soft. Auscultation:Auscultation of the abdomen reveals - Bowel sounds normal.   Female Genitourinary Not done, not pertinent to present illness  Musculoskeletal On exam well developed female in no distress. Evaluation of her left knee shows valgus deformity of about 10 degrees. Her range of motion is about 5 to 125. There is moderate crepitus on range of motion. Tenderness medial and lateral. There is no instability noted.  RADIOGRAPHS: AP and lateral of the knee are reviewed and she has bone on bone arthritis in the lateral and patellofemoral compartments.   Assessment & Plan Primary osteoarthritis of one knee (715.16) Impression: Left Knee  Note: Plan is for a Left Total Knee Replacement by Dr. Wynelle Link.  Plan is to go home.  PCP - Dr. Sarajane Jews - Patient has been seen preoperatively and felt to be stable for surgery. Cardiology - Dr. Johnsie Cancel - Patient has been seen preoperatively and felt to be stable for surgery.  The patient does not have any contraindications and will receive TXA (tranexamic acid) prior to surgery.  Signed electronically by Joelene Millin, III PA-C

## 2013-12-21 NOTE — Anesthesia Procedure Notes (Signed)
Spinal  Patient location during procedure: OR Start time: 12/21/2013 7:02 AM End time: 12/21/2013 7:08 AM Staffing CRNA/Resident: Anne Fu Performed by: resident/CRNA  Preanesthetic Checklist Completed: patient identified, site marked, surgical consent, pre-op evaluation, timeout performed, IV checked, risks and benefits discussed and monitors and equipment checked Spinal Block Patient position: sitting Prep: Betadine Patient monitoring: heart rate, continuous pulse ox and blood pressure Approach: right paramedian Location: L2-3 Injection technique: single-shot Needle Needle type: Spinocan  Needle gauge: 22 G Needle length: 9 cm Assessment Sensory level: T4 Additional Notes Expiration date of kit checked and confirmed. Patient tolerated procedure well, without complications. X 2 attempts with clear CSF return noted paresthesia X 1 right side, denied pain on withdrawal of needle. Tolerated procedure well. Noted T4 level on exam.

## 2013-12-22 DIAGNOSIS — IMO0002 Reserved for concepts with insufficient information to code with codable children: Secondary | ICD-10-CM

## 2013-12-22 DIAGNOSIS — K219 Gastro-esophageal reflux disease without esophagitis: Secondary | ICD-10-CM

## 2013-12-22 DIAGNOSIS — R002 Palpitations: Secondary | ICD-10-CM | POA: Insufficient documentation

## 2013-12-22 LAB — CBC
HCT: 31 % — ABNORMAL LOW (ref 36.0–46.0)
Hemoglobin: 10.6 g/dL — ABNORMAL LOW (ref 12.0–15.0)
MCH: 29 pg (ref 26.0–34.0)
MCHC: 34.2 g/dL (ref 30.0–36.0)
MCV: 84.7 fL (ref 78.0–100.0)
PLATELETS: 177 10*3/uL (ref 150–400)
RBC: 3.66 MIL/uL — ABNORMAL LOW (ref 3.87–5.11)
RDW: 13.4 % (ref 11.5–15.5)
WBC: 11.6 10*3/uL — AB (ref 4.0–10.5)

## 2013-12-22 LAB — BASIC METABOLIC PANEL
BUN: 9 mg/dL (ref 6–23)
CHLORIDE: 102 meq/L (ref 96–112)
CO2: 22 mEq/L (ref 19–32)
Calcium: 8.7 mg/dL (ref 8.4–10.5)
Creatinine, Ser: 0.5 mg/dL (ref 0.50–1.10)
GFR calc non Af Amer: >90 mL/min (ref >90)
Glucose, Bld: 146 mg/dL — ABNORMAL HIGH (ref 70–99)
POTASSIUM: 4.5 meq/L (ref 3.7–5.3)
Sodium: 138 mEq/L (ref 137–147)

## 2013-12-22 MED ORDER — OMEPRAZOLE 20 MG PO CPDR
40.0000 mg | DELAYED_RELEASE_CAPSULE | Freq: Every day | ORAL | Status: DC
  Administered 2013-12-23: 40 mg via ORAL
  Filled 2013-12-22 (×2): qty 2

## 2013-12-22 MED ORDER — NON FORMULARY: 40.0000 mg | Freq: Every day | Status: DC

## 2013-12-22 NOTE — Progress Notes (Addendum)
   CARE MANAGEMENT NOTE 12/22/2013  Patient:  PETRITA, BLUNCK   Account Number:  [id number]  Date Initiated:  12/22/2013  Documentation initiated by:  [facility]  Subjective/Objective Assessment:   LEFT TOTAL KNEE ARTHROPLASTY     Action/Plan:   [place]   Anticipated DC Date:  12/24/2013   Anticipated DC Plan:  [place]  CM consult      [facility] Choice  HOME HEALTH   Choice offered to / List presented to:  C-1 Patient        [place] arranged  HH-2 PT      [place]   Status of service:  Completed, signed off Medicare Important Message given?   (If response is "NO", the following Medicare IM given date fields will be blank) Date Medicare IM given:   Date Additional Medicare IM given:    Discharge Disposition:  [place]  Per UR Regulation:    If discussed at Long Length of Stay Meetings, dates discussed:    Comments:  12/22/2013 11:06 AM  NCM spoke to pt and offered choice for [facility]. Pt agreeable to [facility] for [facility]. States she has RW, cane and 3n1 at home. [facility] notified. Jonnie Finner RN CCM Case Mgmt phone [phone]

## 2013-12-22 NOTE — Progress Notes (Signed)
   Subjective: 1 Day Post-Op Procedure(s) (LRB): LEFT TOTAL KNEE ARTHROPLASTY (Left) Patient reports pain as moderate.   Patient seen in rounds with Dr. Wynelle Link.  Rough night but better this morning. Patient is well, but has had some minor complaints of pain in the knee, requiring pain medications We will start therapy today.  Plan is to go Home after hospital stay.  Objective: Vital signs in last 24 hours: Temp:  [97.4 F (36.3 C)-98 F (36.7 C)] 98 F (36.7 C) (03/03 0546) Pulse Rate:  [57-98] 91 (03/03 0546) Resp:  [11-20] 16 (03/03 0708) BP: (113-145)/(62-79) 115/72 mmHg (03/03 0546) SpO2:  [98 %-100 %] 99 % (03/03 0708) Weight:  [99.394 kg (219 lb 2 oz)] 99.394 kg (219 lb 2 oz) (03/02 0957)  Intake/Output from previous day:  Intake/Output Summary (Last 24 hours) at 12/22/13 0845 Last data filed at 12/22/13 0636  Gross per 24 hour  Intake 3226.25 ml  Output   5819 ml  Net -2592.75 ml    Intake/Output this shift: UOP 2150 ?? -1092  Labs:  Recent Labs  12/22/13 0435  HGB 10.6*    Recent Labs  12/22/13 0435  WBC 11.6*  RBC 3.66*  HCT 31.0*  PLT 177    Recent Labs  12/22/13 0435  NA 138  K 4.5  CL 102  CO2 22  BUN 9  CREATININE 0.50  GLUCOSE 146*  CALCIUM 8.7   No results found for this basename: LABPT, INR,  in the last 72 hours  EXAM General - Patient is Alert, Appropriate and Oriented Extremity - Neurovascular intact Sensation intact distally Dressing - dressing C/D/I Motor Function - intact, moving foot and toes well on exam.  Hemovac pulled without difficulty.  Past Medical History  Diagnosis Date  . Palpitation     hx  . Osteoarthritis   . Hypertension   . Hx of colonic polyp   . Allergy   . GERD (gastroesophageal reflux disease)   . Hyperlipidemia   . Asthma     infrequent problem  . Bruises easily   . DDD (degenerative disc disease)     Assessment/Plan: 1 Day Post-Op Procedure(s) (LRB): LEFT TOTAL KNEE ARTHROPLASTY  (Left) Principal Problem:   OA (osteoarthritis) of knee Active Problems:   HYPERLIPIDEMIA   HYPERTENSION   ASTHMA   GERD (gastroesophageal reflux disease)   Degeneration of intervertebral disc, site unspecified  Estimated body mass index is 37.05 kg/(m^2) as calculated from the following:   Height as of this encounter: 5' 4.5" (1.638 m).   Weight as of this encounter: 99.394 kg (219 lb 2 oz). Advance diet Up with therapy Plan for discharge tomorrow Discharge home with home health  DVT Prophylaxis - Xarelto Weight-Bearing as tolerated to left leg D/C O2 and Pulse OX and try on Room Air  Natalie Hall 12/22/2013, 8:45 AM

## 2013-12-22 NOTE — Evaluation (Signed)
Occupational Therapy Evaluation Patient Details Name: Natalie Hall MRN: [id number] DOB: [birth year] Today's Date: 12/22/2013 Time: 8182-9937 OT Time Calculation (min): 35 min  OT Assessment / Plan / Recommendation History of present illness L TKA   Clinical Impression   Pt was admitted for the above surgery. All education was completed.  Pt has DME from foot and ankle sx.  Husband will assist at home.     OT Assessment  Patient does not need any further OT services    Follow Up Recommendations  No OT follow up    Barriers to Discharge      Equipment Recommendations  None recommended by OT    Recommendations for Other Services    Frequency       Precautions / Restrictions Precautions Precautions: Knee Required Braces or Orthoses: Knee Immobilizer - Left Knee Immobilizer - Left: Discontinue once straight leg raise with < 10 degree lag Restrictions Weight Bearing Restrictions: No   Pertinent Vitals/Pain 5/10 with weightbearing:  LLE.  Repositioned with ice    ADL  Grooming: Set up;Wash/dry hands;Wash/dry face Where Assessed - Grooming: Unsupported sitting Upper Body Bathing: Set up Where Assessed - Upper Body Bathing: Unsupported sitting Lower Body Bathing: Minimal assistance Where Assessed - Lower Body Bathing: Supported sit to stand Upper Body Dressing: Set up Where Assessed - Upper Body Dressing: Unsupported sitting Lower Body Dressing: Moderate assistance Where Assessed - Lower Body Dressing: Supported sit to stand Toilet Transfer: Magazine features editor Method: Sit to Loss adjuster, chartered: Raised toilet seat with arms (or 3-in-1 over toilet) [place] and Hygiene: Min guard Where Assessed - Toileting Clothing Manipulation and Hygiene: Sit to stand from 3-in-1 or toilet Equipment Used: Rolling walker;Reacher Transfers/Ambulation Related to ADLs: cues when ambulating to bathroom to put weight through LLE and to place heel down:   min guard ADL Comments: pt interested in reacher.  Able to hook pants over feet without but KI got in way.  Did not use for pants but practiced with reacher.  Will have husband help with socks.  Would benefit from netted sponge on stick  Pt feels comfortable with tub bench transfer:  will have husband help with leg.  At first talked about propping leg on side of tub but OT recommended bringing all the way in for safety.  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[employer] OT Received On: 12/22/13 Assistance Needed: +1 History of Present Illness: L TKA       Prior [place] expects to be discharged to:: Private residence Living Arrangements: Spouse/significant other Home Equipment: Tub bench;Bedside commode;Walker - standard;Cane - single point (has wheels she can put on walker) Prior Function Level of Independence: Independent Communication Communication: No difficulties         Vision/Perception     Cognition  Cognition Arousal/Alertness: Awake/alert Behavior During Therapy: WFL for tasks assessed/performed Overall Cognitive Status: Within Functional Limits for tasks assessed    Extremity/Trunk Assessment Upper Extremity Assessment Upper Extremity Assessment: Overall WFL for tasks assessed     Mobility Bed Mobility Bed Mobility: Supine to Sit Supine to sit: Min assist  General bed mobility comments: min A to raise trunk and support LLE, VCs for hand placement and technique Transfers Transfers: Sit to/from Stand Sit to Stand: Min assist;Min guard General transfer comment: from elevated bed and 3:1 commode, VCs hand placement     Exercise     Balance     End of Session OT -  End of Session Activity Tolerance: Patient tolerated treatment well Patient left: in bed;with call bell/phone within reach CPM Left Knee CPM Left Knee: Off  GO     Natalie Hall 12/22/2013, 9:43 AM Lesle Chris, OTR/L [phone] 12/22/2013

## 2013-12-22 NOTE — Progress Notes (Signed)
Physical Therapy Treatment Patient Details Name: Natalie Hall MRN: [id number] DOB: 16-Feb-1951 Today's Date: 12/22/2013 Time: 1740-8144 PT Time Calculation (min): 24 min  PT Assessment / Plan / Recommendation  History of Present Illness L TKA   PT Comments   *Pt reporting muscle spasm in L thigh. Able to tolerate walking 130' but unable to tolerate knee flexion exercises 2* pain. RN notified. **  Follow Up Recommendations  Home health PT     Does the patient have the potential to tolerate intense rehabilitation     Barriers to Discharge        Equipment Recommendations  None recommended by PT    Recommendations for Other Services    Frequency 7X/week   Progress towards PT Goals Progress towards PT goals: Progressing toward goals  Plan Current plan remains appropriate    Precautions / Restrictions Precautions Precautions: Knee Required Braces or Orthoses: Knee Immobilizer - Left Knee Immobilizer - Left: Discontinue once straight leg raise with < 10 degree lag Restrictions Weight Bearing Restrictions: No   Pertinent Vitals/Pain **7/10 L knee RN notified, ice applied*    Mobility  Bed Mobility Bed Mobility: Sit to Supine Sit to supine: Min assist General bed mobility comments: Instructed pt to self assist LLE with RLE Transfers Overall transfer level: Needs assistance Equipment used: Rolling walker (2 wheeled) Transfers: Sit to/from Stand Sit to Stand: Min guard General transfer comment: from bed with UE support, good hand placement Ambulation/Gait Ambulation/Gait assistance: Min guard Ambulation Distance (Feet): 130 Feet Assistive device: Rolling walker (2 wheeled) Gait Pattern/deviations: Step-to pattern Gait velocity: decr General Gait Details: VCs for positioning in RW, to widen BOS, for heel strike, to lift head    Exercises Total Joint Exercises Ankle Circles/Pumps: AROM;Both;10 reps;Supine Quad Sets: AROM;Both;5 reps;Supine Towel Squeeze: AROM;Both;10  reps;Supine Short Arc Quad: AAROM;Left;10 reps;Supine Heel Slides: AAROM;Left;Supine;Other reps (comment) (1 rep, pt had severe muscle spasm and couldn't proceed) Hip ABduction/ADduction: AAROM;Left;10 reps;Supine Straight Leg Raises: AAROM;Left;10 reps;Supine Goniometric ROM: 40* flexion L knee AAROM   PT Diagnosis:    PT Problem List:   PT Treatment Interventions:     PT Goals (current goals can now be found in the care plan section) Acute Rehab PT Goals 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 [employer] PT Goal Formulation: With patient/family Time For Goal Achievement: 01/04/14 Potential to Achieve Goals: Good  Visit Information  Last PT Received On: 12/22/13 Assistance Needed: +1 History of Present Illness: L TKA    Subjective Data  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

## 2013-12-22 NOTE — Discharge Instructions (Addendum)
° °Dr. Frank Aluisio °Total Joint Specialist °[facility] °[address] °[place], [place] [address] °[phone] ° °TOTAL KNEE REPLACEMENT POSTOPERATIVE DIRECTIONS ° ° ° °Knee Rehabilitation, Guidelines Following Surgery  °Results after knee surgery are often greatly improved when you follow the exercise, range of motion and muscle strengthening exercises prescribed by your doctor. Safety measures are also important to protect the knee from further injury. Any time any of these exercises cause you to have increased pain or swelling in your knee joint, decrease the amount until you are comfortable again and slowly increase them. If you have problems or questions, call your caregiver or physical therapist for advice.  ° °HOME CARE INSTRUCTIONS  °Remove items at home which could result in a fall. This includes throw rugs or furniture in walking pathways.  °Continue medications as instructed at time of discharge. °You may have some home medications which will be placed on hold until you complete the course of blood thinner medication.  °You may start showering once you are discharged home but do not submerge the incision under water. Just pat the incision dry and apply a dry gauze dressing on daily. °Walk with walker as instructed.  °You may resume a sexual relationship in one month or when given the OK by  your doctor.  °· Use walker as long as suggested by your caregivers. °· Avoid periods of inactivity such as sitting longer than an hour when not asleep. This helps prevent blood clots.  °You may put full weight on your legs and walk as much as is comfortable.  °You may return to work once you are cleared by your doctor.  °Do not drive a car for 6 weeks or until released by you surgeon.  °· Do not drive while taking narcotics.  °Wear the elastic stockings for three weeks following surgery during the day but you may remove then at night. °Make sure you keep all of your appointments after your  operation with all of your doctors and caregivers. You should call the office at the above phone number and make an appointment for approximately two weeks after the date of your surgery. °Change the dressing daily and reapply a dry dressing each time. °Please pick up a stool softener and laxative for home use as long as you are requiring pain medications. °· Continue to use ice on the knee for pain and swelling from surgery. You may notice swelling that will progress down to the foot and ankle.  This is normal after surgery.  Elevate the leg when you are not up walking on it.   °It is important for you to complete the blood thinner medication as prescribed by your doctor. °· Continue to use the breathing machine which will help keep your temperature down.  It is common for your temperature to cycle up and down following surgery, especially at night when you are not up moving around and exerting yourself.  The breathing machine keeps your lungs expanded and your temperature down. ° °RANGE OF MOTION AND STRENGTHENING EXERCISES  °Rehabilitation of the knee is important following a knee injury or an operation. After just a few days of immobilization, the muscles of the thigh which control the knee become weakened and shrink (atrophy). Knee exercises are designed to build up the tone and strength of the thigh muscles and to improve knee motion. Often times heat used for twenty to thirty minutes before working out will loosen up your tissues and help with improving the   range of motion but do not use heat for the first two weeks following surgery. These exercises can be done on a training (exercise) mat, on the floor, on a table or on a bed. Use what ever works the best and is most comfortable for you Knee exercises include:  °Leg Lifts - While your knee is still immobilized in a splint or cast, you can do straight leg raises. Lift the leg to 60 degrees, hold for 3 sec, and slowly lower the leg. Repeat 10-20 times 2-3  times daily. Perform this exercise against resistance later as your knee gets better.  °Quad and Hamstring Sets - Tighten up the muscle on the front of the thigh (Quad) and hold for 5-10 sec. Repeat this 10-20 times hourly. Hamstring sets are done by pushing the foot backward against an object and holding for 5-10 sec. Repeat as with quad sets.  °A rehabilitation program following serious knee injuries can speed recovery and prevent re-injury in the future due to weakened muscles. Contact your doctor or a physical therapist for more information on knee rehabilitation.  ° °SKILLED REHAB INSTRUCTIONS: °If the patient is transferred to a skilled rehab facility following release from the hospital, a list of the current medications will be sent to the facility for the patient to continue.  When discharged from the skilled rehab facility, please have the facility set up the patient's Home Health Physical Therapy prior to being released. Also, the skilled facility will be responsible for providing the patient with their medications at time of release from the facility to include their pain medication, the muscle relaxants, and their blood thinner medication. If the patient is still at the rehab facility at time of the two week follow up appointment, the skilled rehab facility will also need to assist the patient in arranging follow up appointment in our office and any transportation needs. ° °MAKE SURE YOU:  °Understand these instructions.  °Will watch your condition.  °Will get help right away if you are not doing well or get worse.  ° ° °Pick up stool softner and laxative for home. °Do not submerge incision under water. °May shower. °Continue to use ice for pain and swelling from surgery. ° °Take Xarelto for two and a half more weeks, then discontinue Xarelto. °Once the patient has completed the blood thinner regimen, then take a Baby 81 mg Aspirin daily for four more weeks. ° ° ° °Information on my medicine - XARELTO®  (Rivaroxaban) ° °This medication education was reviewed with me or my healthcare representative as part of my discharge preparation.  The pharmacist that spoke with me during my hospital stay was:  Absher, Randall K, RPH ° °Why was Xarelto® prescribed for you? °Xarelto® was prescribed for you to reduce the risk of blood clots forming after orthopedic surgery. The medical term for these abnormal blood clots is venous thromboembolism (VTE). ° °What do you need to know about xarelto® ? °Take your Xarelto® ONCE DAILY at the same time every day. °You may take it either with or without food. ° °If you have difficulty swallowing the tablet whole, you may crush it and mix in applesauce just prior to taking your dose. ° °Take Xarelto® exactly as prescribed by your doctor and DO NOT stop taking Xarelto® without talking to the doctor who prescribed the medication.  Stopping without other VTE prevention medication to take the place of Xarelto® may increase your risk of developing a clot. ° °After discharge, you should have regular   check-up appointments with your healthcare provider that is prescribing your Xarelto®.   ° °What do you do if you miss a dose? °If you miss a dose, take it as soon as you remember on the same day then continue your regularly scheduled once daily regimen the next day. Do not take two doses of Xarelto® on the same day.  ° °Important Safety Information °A possible side effect of Xarelto® is bleeding. You should call your healthcare provider right away if you experience any of the following: °  Bleeding from an injury or your nose that does not stop. °  Unusual colored urine (red or dark brown) or unusual colored stools (red or black). °  Unusual bruising for unknown reasons. °  A serious fall or if you hit your head (even if there is no bleeding). ° °Some medicines may interact with Xarelto® and might increase your risk of bleeding while on Xarelto®. To help avoid this, consult your healthcare provider  or pharmacist prior to using any new prescription or non-prescription medications, including herbals, vitamins, non-steroidal anti-inflammatory drugs (NSAIDs) and supplements. ° °This website has more information on Xarelto®: www.xarelto.com. ° ° °

## 2013-12-22 NOTE — Progress Notes (Signed)
Physical Therapy Treatment Patient Details Name: Natalie Hall MRN: [id number] DOB: 11-30-1950 Today's Date: 12/22/2013 Time: 6767-2094 PT Time Calculation (min): 25 min  PT Assessment / Plan / Recommendation  History of Present Illness L TKA   PT Comments   **Pt making good progress with mobility. She walked 62' with RW, performed L TKA exercises with assist.  *  Follow Up Recommendations  Home health PT     Does the patient have the potential to tolerate intense rehabilitation     Barriers to Discharge        Equipment Recommendations  None recommended by PT    Recommendations for Other Services    Frequency 7X/week   Progress towards PT Goals Progress towards PT goals: Progressing toward goals  Plan Current plan remains appropriate    Precautions / Restrictions Precautions Precautions: Knee Required Braces or Orthoses: Knee Immobilizer - Left Knee Immobilizer - Left: Discontinue once straight leg raise with < 10 degree lag Restrictions Weight Bearing Restrictions: No   Pertinent Vitals/Pain *5/10 L knee with activity Premedicated, ice applied**    Mobility  Bed Mobility Bed Mobility: Sit to Supine Supine to sit: Min assist Sit to supine: Supervision General bed mobility comments: Instructed pt to self assist LLE with RLE Transfers Transfers: Sit to/from Stand Sit to Stand: Min guard General transfer comment: from recliner with UE support, good hand placement Ambulation/Gait Ambulation/Gait assistance: Min guard Ambulation Distance (Feet): 80 Feet Assistive device: Rolling walker (2 wheeled) Gait Pattern/deviations: Step-to pattern;Decreased dorsiflexion - left;Narrow base of support Gait velocity: decr General Gait Details: VCs for positioning in RW, to widen BOS, for heel strike, to lift head    Exercises Total Joint Exercises Ankle Circles/Pumps: AROM;Both;10 reps;Supine Quad Sets: AROM;Both;5 reps;Supine Towel Squeeze: AROM;Both;10 reps;Supine Short  Arc Quad: AAROM;Left;10 reps;Supine Heel Slides: AAROM;Left;10 reps;Supine Hip ABduction/ADduction: AAROM;Left;10 reps;Supine Straight Leg Raises: AAROM;Left;10 reps;Supine Goniometric ROM: 40* flexion L knee AAROM   PT Diagnosis:    PT Problem List:   PT Treatment Interventions:     PT Goals (current goals can now be found in the care plan section) Acute Rehab PT Goals 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 [employer] PT Goal Formulation: With patient/family Time For Goal Achievement: 01/04/14 Potential to Achieve Goals: Good  Visit Information  Last PT Received On: 12/22/13 Assistance Needed: +1 History of Present Illness: L TKA    Subjective Data  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

## 2013-12-23 LAB — CBC
HCT: 29.8 % — ABNORMAL LOW (ref 36.0–46.0)
Hemoglobin: 9.7 g/dL — ABNORMAL LOW (ref 12.0–15.0)
MCH: 28.1 pg (ref 26.0–34.0)
MCHC: 32.6 g/dL (ref 30.0–36.0)
MCV: 86.4 fL (ref 78.0–100.0)
PLATELETS: 163 10*3/uL (ref 150–400)
RBC: 3.45 MIL/uL — ABNORMAL LOW (ref 3.87–5.11)
RDW: 13.8 % (ref 11.5–15.5)
WBC: 11.4 10*3/uL — ABNORMAL HIGH (ref 4.0–10.5)

## 2013-12-23 LAB — BASIC METABOLIC PANEL
BUN: 12 mg/dL (ref 6–23)
CALCIUM: 9 mg/dL (ref 8.4–10.5)
CO2: 27 mEq/L (ref 19–32)
CREATININE: 0.54 mg/dL (ref 0.50–1.10)
Chloride: 98 mEq/L (ref 96–112)
Glucose, Bld: 150 mg/dL — ABNORMAL HIGH (ref 70–99)
Potassium: 4.6 mEq/L (ref 3.7–5.3)
Sodium: 136 mEq/L — ABNORMAL LOW (ref 137–147)

## 2013-12-23 MED ORDER — TRAMADOL HCL 50 MG PO TABS: 50.0000 mg | ORAL_TABLET | Freq: Four times a day (QID) | ORAL | Status: DC | PRN

## 2013-12-23 MED ORDER — METHOCARBAMOL 500 MG PO TABS: 500.0000 mg | ORAL_TABLET | Freq: Four times a day (QID) | ORAL | Status: DC | PRN

## 2013-12-23 MED ORDER — RIVAROXABAN 10 MG PO TABS: 10.0000 mg | ORAL_TABLET | Freq: Every day | ORAL | Status: DC

## 2013-12-23 MED ORDER — OXYCODONE HCL 5 MG PO TABS: 5.0000 mg | ORAL_TABLET | ORAL | Status: DC | PRN

## 2013-12-23 NOTE — Discharge Summary (Signed)
Physician Discharge Summary   Patient ID: Hall Natalie MRN: [id number] DOB/AGE: 01-22-1951 63 y.o.  Admit date: 12/21/2013 Discharge date: 12/23/2013  Primary Diagnosis:  Osteoarthritis Left knee(s)  Admission Diagnoses:  Past Medical History  Diagnosis Date  . Palpitation     hx  . Osteoarthritis   . Hypertension   . Hx of colonic polyp   . Allergy   . GERD (gastroesophageal reflux disease)   . Hyperlipidemia   . Asthma     infrequent problem  . Bruises easily   . DDD (degenerative disc disease)    Discharge Diagnoses:   Principal Problem:   OA (osteoarthritis) of knee Active Problems:   HYPERLIPIDEMIA   HYPERTENSION   ASTHMA   GERD (gastroesophageal reflux disease)   Degeneration of intervertebral disc, site unspecified  Estimated body mass index is 37.05 kg/(m^2) as calculated from the following:   Height as of this encounter: 5' 4.5" (1.638 m).   Weight as of this encounter: 99.394 kg (219 lb 2 oz).  Procedure:  Procedure(s) (LRB): LEFT TOTAL KNEE ARTHROPLASTY (Left)   Consults: None  HPI: Natalie Hall is a 63 y.o. year old female with end stage OA of her left knee with progressively worsening pain and dysfunction. She has constant pain, with activity and at rest and significant functional deficits with difficulties even with ADLs. She has had extensive non-op management including analgesics, injections of cortisone and viscosupplements, and home exercise program, but remains in significant pain with significant dysfunction. Radiographs show bone on bone arthritis lateral and patellofemoral with large valgus deformity. She presents now for left Total Knee Arthroplasty.   Laboratory Data: Admission on 12/21/2013, Discharged on 12/23/2013  Component Date Value Ref Range Status  . WBC 12/22/2013 11.6* 4.0 - 10.5 K/uL Final  . RBC 12/22/2013 3.66* 3.87 - 5.11 MIL/uL Final  . Hemoglobin 12/22/2013 10.6* 12.0 - 15.0 g/dL Final  . HCT 12/22/2013 31.0* 36.0 - 46.0 %  Final  . MCV 12/22/2013 84.7  78.0 - 100.0 fL Final  . MCH 12/22/2013 29.0  26.0 - 34.0 pg Final  . MCHC 12/22/2013 34.2  30.0 - 36.0 g/dL Final  . RDW 12/22/2013 13.4  11.5 - 15.5 % Final  . Platelets 12/22/2013 177  150 - 400 K/uL Final  . Sodium 12/22/2013 138  137 - 147 mEq/L Final  . Potassium 12/22/2013 4.5  3.7 - 5.3 mEq/L Final   Comment: SLIGHT HEMOLYSIS                          HEMOLYSIS AT THIS LEVEL MAY AFFECT RESULT  . Chloride 12/22/2013 102  96 - 112 mEq/L Final  . CO2 12/22/2013 22  19 - 32 mEq/L Final  . Glucose, Bld 12/22/2013 146* 70 - 99 mg/dL Final  . BUN 12/22/2013 9  6 - 23 mg/dL Final  . Creatinine, Ser 12/22/2013 0.50  0.50 - 1.10 mg/dL Final  . Calcium 12/22/2013 8.7  8.4 - 10.5 mg/dL Final  . GFR calc non Af Amer 12/22/2013 >90  >90 mL/min Final  . GFR calc Af Amer 12/22/2013 >90  >90 mL/min Final   Comment: (NOTE)                          The eGFR has been calculated using the CKD EPI equation.  This calculation has not been validated in all clinical situations.                          eGFR's persistently <90 mL/min signify possible Chronic Kidney                          Disease.  . WBC 12/23/2013 11.4* 4.0 - 10.5 K/uL Final  . RBC 12/23/2013 3.45* 3.87 - 5.11 MIL/uL Final  . Hemoglobin 12/23/2013 9.7* 12.0 - 15.0 g/dL Final  . HCT 12/23/2013 29.8* 36.0 - 46.0 % Final  . MCV 12/23/2013 86.4  78.0 - 100.0 fL Final  . MCH 12/23/2013 28.1  26.0 - 34.0 pg Final  . MCHC 12/23/2013 32.6  30.0 - 36.0 g/dL Final  . RDW 12/23/2013 13.8  11.5 - 15.5 % Final  . Platelets 12/23/2013 163  150 - 400 K/uL Final  . Sodium 12/23/2013 136* 137 - 147 mEq/L Final  . Potassium 12/23/2013 4.6  3.7 - 5.3 mEq/L Final  . Chloride 12/23/2013 98  96 - 112 mEq/L Final  . CO2 12/23/2013 27  19 - 32 mEq/L Final  . Glucose, Bld 12/23/2013 150* 70 - 99 mg/dL Final  . BUN 12/23/2013 12  6 - 23 mg/dL Final  . Creatinine, Ser 12/23/2013 0.54  0.50 - 1.10 mg/dL  Final  . Calcium 12/23/2013 9.0  8.4 - 10.5 mg/dL Final  . GFR calc non Af Amer 12/23/2013 >90  >90 mL/min Final  . GFR calc Af Amer 12/23/2013 >90  >90 mL/min Final   Comment: (NOTE)                          The eGFR has been calculated using the CKD EPI equation.                          This calculation has not been validated in all clinical situations.                          eGFR's persistently <90 mL/min signify possible Chronic Kidney                          Disease.  Hospital Outpatient Visit on 12/15/2013  Component Date Value Ref Range Status  . MRSA, PCR 12/15/2013 NEGATIVE  NEGATIVE Final  . Staphylococcus aureus 12/15/2013 NEGATIVE  NEGATIVE Final   Comment:                                 The Xpert SA Assay (FDA                          approved for NASAL specimens                          in patients over 2 years of age),                          is one component of                          a comprehensive surveillance  program.  Test performance has                          been validated by [facility] for patients greater                          than or equal to 34 year old.                          It is not intended                          to diagnose infection nor to                          guide or monitor treatment.  Marland Kitchen aPTT 12/15/2013 30  24 - 37 seconds Final  . WBC 12/15/2013 4.2  4.0 - 10.5 K/uL Final  . RBC 12/15/2013 4.53  3.87 - 5.11 MIL/uL Final  . Hemoglobin 12/15/2013 12.5  12.0 - 15.0 g/dL Final  . HCT 12/15/2013 38.9  36.0 - 46.0 % Final  . MCV 12/15/2013 85.9  78.0 - 100.0 fL Final  . MCH 12/15/2013 27.6  26.0 - 34.0 pg Final  . MCHC 12/15/2013 32.1  30.0 - 36.0 g/dL Final  . RDW 12/15/2013 13.5  11.5 - 15.5 % Final  . Platelets 12/15/2013 182  150 - 400 K/uL Final  . Sodium 12/15/2013 140  137 - 147 mEq/L Final  . Potassium 12/15/2013 4.3  3.7 - 5.3 mEq/L Final  . Chloride 12/15/2013 103   96 - 112 mEq/L Final  . CO2 12/15/2013 26  19 - 32 mEq/L Final  . Glucose, Bld 12/15/2013 107* 70 - 99 mg/dL Final  . BUN 12/15/2013 13  6 - 23 mg/dL Final  . Creatinine, Ser 12/15/2013 0.56  0.50 - 1.10 mg/dL Final  . Calcium 12/15/2013 9.8  8.4 - 10.5 mg/dL Final  . Total Protein 12/15/2013 7.3  6.0 - 8.3 g/dL Final  . Albumin 12/15/2013 3.9  3.5 - 5.2 g/dL Final  . AST 12/15/2013 23  0 - 37 U/L Final  . ALT 12/15/2013 26  0 - 35 U/L Final  . Alkaline Phosphatase 12/15/2013 55  39 - 117 U/L Final  . Total Bilirubin 12/15/2013 0.3  0.3 - 1.2 mg/dL Final  . GFR calc non Af Amer 12/15/2013 >90  >90 mL/min Final  . GFR calc Af Amer 12/15/2013 >90  >90 mL/min Final   Comment: (NOTE)                          The eGFR has been calculated using the CKD EPI equation.                          This calculation has not been validated in all clinical situations.                          eGFR's persistently <90 mL/min signify possible Chronic Kidney  Disease.  Marland Kitchen Prothrombin Time 12/15/2013 12.7  11.6 - 15.2 seconds Final  . INR 12/15/2013 0.97  0.00 - 1.49 Final  . Color, Urine 12/15/2013 YELLOW  YELLOW Final  . APPearance 12/15/2013 CLOUDY* CLEAR Final  . Specific Gravity, Urine 12/15/2013 1.004* 1.005 - 1.030 Final  . pH 12/15/2013 7.5  5.0 - 8.0 Final  . Glucose, UA 12/15/2013 NEGATIVE  NEGATIVE mg/dL Final  . Hgb urine dipstick 12/15/2013 SMALL* NEGATIVE Final  . Bilirubin Urine 12/15/2013 NEGATIVE  NEGATIVE Final  . Ketones, ur 12/15/2013 NEGATIVE  NEGATIVE mg/dL Final  . Protein, ur 12/15/2013 NEGATIVE  NEGATIVE mg/dL Final  . Urobilinogen, UA 12/15/2013 0.2  0.0 - 1.0 mg/dL Final  . Nitrite 12/15/2013 NEGATIVE  NEGATIVE Final  . Leukocytes, UA 12/15/2013 NEGATIVE  NEGATIVE Final  . ABO/RH(D) 12/15/2013 A POS   Final  . Antibody Screen 12/15/2013 NEG   Final  . Sample Expiration 12/15/2013 12/24/2013   Final  . Squamous Epithelial / LPF 12/15/2013 RARE  RARE  Final  . RBC / HPF 12/15/2013 0-2  <3 RBC/hpf Final  . ABO/RH(D) 12/15/2013 A POS   Final     X-Rays:Dg Chest 2 View  12/15/2013   CLINICAL DATA:  Preoperative for total knee replacement. History of asthma.  EXAM: CHEST  2 VIEW  COMPARISON:  None.  FINDINGS: The heart size and mediastinal contours are within normal limits. Both lungs are clear. There are degenerative joint changes of spine with scoliosis of spine. The visualized skeletal structures are otherwise unremarkable.  IMPRESSION: No active cardiopulmonary disease.   Electronically Signed   By: Abelardo Diesel [name]   On: 12/15/2013 09:55    EKG: Orders placed in visit on 11/11/13  . EKG 12-LEAD     Hospital Course: Natalie Hall is a 63 y.o. who was admitted to [facility]. They were brought to the operating room on 12/21/2013 and underwent Procedure(s): LEFT TOTAL KNEE ARTHROPLASTY.  Patient tolerated the procedure well and was later transferred to the recovery room and then to the orthopaedic floor for postoperative care.  They were given PO and IV analgesics for pain control following their surgery.  They were given 24 hours of postoperative antibiotics of  Anti-infectives   Start     Dose/Rate Route Frequency Ordered Stop   12/21/13 1300  ceFAZolin (ANCEF) IVPB 2 g/50 mL premix     2 g 100 mL/hr over 30 Minutes Intravenous Every 6 hours 12/21/13 1000 12/21/13 1830   12/21/13 0506  ceFAZolin (ANCEF) IVPB 2 g/50 mL premix     2 g 100 mL/hr over 30 Minutes Intravenous On call to O.R. 12/21/13 8466 12/21/13 0713     and started on DVT prophylaxis in the form of Xarelto.   PT and OT were ordered for total joint protocol.  Discharge planning consulted to help with postop disposition and equipment needs.  Patient had a rough night on the evening of surgery.  They started to get up OOB with therapy on day one. Hemovac drain was pulled without difficulty.  Continued to work with therapy into day two.  Dressing was changed on day two  and the incision was healing well.  Patient was seen in rounds and was better on day two and was ready to go home that same day.   Discharge Medications: Prior to Admission medications   Medication Sig Start Date End Date Taking? Authorizing Provider  diltiazem (CARDIZEM) 120 MG tablet Take 120 mg by mouth every  morning.   Yes Historical Provider, MD  loratadine (CLARITIN) 10 MG tablet Take 10 mg by mouth daily.   Yes Historical Provider, MD  omeprazole (PRILOSEC) 40 MG capsule Take 1 capsule by mouth  daily. 11/17/13  Yes Laurey Morale, MD  Polyethyl Glycol-Propyl Glycol (SYSTANE OP) Apply 1 drop to eye daily as needed (Dry eyes).   Yes Historical Provider, MD  ramipril (ALTACE) 5 MG capsule Take 1 capsule (5 mg total) by mouth daily. 01/29/13  Yes Laurey Morale, MD  rosuvastatin (CRESTOR) 10 MG tablet Take 1 tablet (10 mg total) by mouth daily. 01/29/13  Yes Laurey Morale, MD  triamcinolone (NASACORT) 55 MCG/ACT nasal inhaler Place 2 sprays into the nose daily. 01/29/13  Yes Laurey Morale, MD  albuterol (PROVENTIL HFA;VENTOLIN HFA) 108 (90 BASE) MCG/ACT inhaler Inhale 1-2 puffs into the lungs every 4 (four) hours as needed for wheezing or shortness of breath. For cough 01/29/13   Laurey Morale, MD  methocarbamol (ROBAXIN) 500 MG tablet Take 1 tablet (500 mg total) by mouth every 6 (six) hours as needed for muscle spasms. 12/23/13   Axel Frisk Dara Lords, PA-C  niacin (NIASPAN) 500 MG CR tablet Take 1 tablet (500 mg total) by mouth at bedtime. 01/29/13   Laurey Morale, MD  oxyCODONE (OXY IR/ROXICODONE) 5 MG immediate release tablet Take 1-2 tablets (5-10 mg total) by mouth every 3 (three) hours as needed for moderate pain, severe pain or breakthrough pain. 12/23/13   Nattalie Santiesteban Dara Lords, PA-C  rivaroxaban (XARELTO) 10 MG TABS tablet Take 1 tablet (10 mg total) by mouth daily with breakfast. Take Xarelto for two and a half more weeks, then discontinue Xarelto. Once the patient has completed the blood thinner  regimen, then take a Baby 81 mg Aspirin daily for four more weeks. 12/23/13   Yosselin Zoeller, PA-C  traMADol (ULTRAM) 50 MG tablet Take 1-2 tablets (50-100 mg total) by mouth every 6 (six) hours as needed (mild to moderate pain). 12/23/13   Aune Adami Dara Lords, PA-C   Discharge home with home health  Diet - Cardiac diet  Follow up - in 2 weeks  Activity - WBAT  Disposition - Home  Condition Upon Discharge - Good  D/C Meds - See DC Summary  DVT Prophylaxis - Xarelto       Discharge Orders   Future Appointments Provider Department Dept Phone   01/26/2014 10:00 AM [facility] [place] at [place]   02/01/2014 10:00 AM Laurey Morale, MD [place] at [place]   Future Orders Complete By Expires   Call MD / Call 911  As directed    Comments:     If you experience chest pain or shortness of breath, CALL 911 and be transported to the hospital emergency room.  If you develope a fever above 101 F, pus (white drainage) or increased drainage or redness at the wound, or calf pain, call your surgeon's office.   Change dressing  As directed    Comments:     Change dressing daily with sterile 4 x 4 inch gauze dressing and apply TED hose. Do not submerge the incision under water.   Constipation Prevention  As directed    Comments:     Drink plenty of fluids.  Prune juice may be helpful.  You may use a stool softener, such as Colace (over the counter) 100 mg twice a day.  Use MiraLax (over the counter) for constipation as needed.   Diet - low  sodium heart healthy  As directed    Discharge instructions  As directed    Comments:     Pick up stool softner and laxative for home. Do not submerge incision under water. May shower. Continue to use ice for pain and swelling from surgery. Hip precautions.  Total Hip Protocol.  Take Xarelto for two and a half more weeks, then discontinue Xarelto.   Do not put a pillow under the knee. Place it under the heel.   As directed    Do not sit on low chairs, stoools or toilet seats, as it may be difficult to get up from low surfaces  As directed    Driving restrictions  As directed    Comments:     No driving until released by the physician.   Increase activity slowly as tolerated  As directed    Lifting restrictions  As directed    Comments:     No lifting until released by the physician.   Patient may shower  As directed    Comments:     You may shower without a dressing once there is no drainage.  Do not wash over the wound.  If drainage remains, do not shower until drainage stops.   TED hose  As directed    Comments:     Use stockings (TED hose) for 3 weeks on both leg(s).  You may remove them at night for sleeping.   Weight bearing as tolerated  As directed    Questions:     Laterality:     Extremity:         Medication List    STOP taking these medications       CALCIUM 1200 PO     multivitamin with minerals Tabs tablet     naproxen sodium 220 MG tablet  Commonly known as:  ANAPROX     Vitamin D3 2000 UNITS capsule      TAKE these medications       albuterol 108 (90 BASE) MCG/ACT inhaler  Commonly known as:  PROVENTIL HFA;VENTOLIN HFA  Inhale 1-2 puffs into the lungs every 4 (four) hours as needed for wheezing or shortness of breath. For cough     diltiazem 120 MG tablet  Commonly known as:  CARDIZEM  Take 120 mg by mouth every morning.     loratadine 10 MG tablet  Commonly known as:  CLARITIN  Take 10 mg by mouth daily.     methocarbamol 500 MG tablet  Commonly known as:  ROBAXIN  Take 1 tablet (500 mg total) by mouth every 6 (six) hours as needed for muscle spasms.     niacin 500 MG CR tablet  Commonly known as:  NIASPAN  Take 1 tablet (500 mg total) by mouth at bedtime.     omeprazole 40 MG capsule  Commonly known as:  PRILOSEC  Take 1 capsule by mouth  daily.     oxyCODONE 5 MG immediate release tablet  Commonly known as:  Oxy IR/ROXICODONE  Take 1-2  tablets (5-10 mg total) by mouth every 3 (three) hours as needed for moderate pain, severe pain or breakthrough pain.     ramipril 5 MG capsule  Commonly known as:  ALTACE  Take 1 capsule (5 mg total) by mouth daily.     rivaroxaban 10 MG Tabs tablet  Commonly known as:  XARELTO  - Take 1 tablet (10 mg total) by mouth daily with breakfast. Take Xarelto for two and a  half more weeks, then discontinue Xarelto.  - Once the patient has completed the blood thinner regimen, then take a Baby 81 mg Aspirin daily for four more weeks.     rosuvastatin 10 MG tablet  Commonly known as:  CRESTOR  Take 1 tablet (10 mg total) by mouth daily.     SYSTANE OP  Apply 1 drop to eye daily as needed (Dry eyes).     traMADol 50 MG tablet  Commonly known as:  ULTRAM  Take 1-2 tablets (50-100 mg total) by mouth every 6 (six) hours as needed (mild to moderate pain).     triamcinolone 55 MCG/ACT nasal inhaler  Commonly known as:  NASACORT  Place 2 sprays into the nose daily.       Follow-up Information   Follow up with [facility])    Contact information:   [place] [address] [phone]       Follow up with Gearlean Alf, MD. Schedule an appointment as soon as possible for a visit on 01/05/2014. (Call for appointment time)    Specialty:  Orthopedic Surgery   Contact information:   [address] [place] [address] [phone]       Signed: Mickel Crow 01/12/2014, 3:09 PM

## 2013-12-23 NOTE — Progress Notes (Signed)
   Subjective: 2 Days Post-Op Procedure(s) (LRB): LEFT TOTAL KNEE ARTHROPLASTY (Left) Patient reports pain as mild.   Patient seen in rounds with Dr. Wynelle Link. Patient is well, but has had some minor complaints of pain in the knee, requiring pain medications Patient is ready to go home  Objective: Vital signs in last 24 hours: Temp:  [97.9 F (36.6 C)-98.1 F (36.7 C)] 98.1 F (36.7 C) (03/04 0530) Pulse Rate:  [80-83] 83 (03/04 0530) Resp:  [16-18] 16 (03/04 0530) BP: (142-153)/(83-88) 142/83 mmHg (03/04 0530) SpO2:  [92 %-96 %] 96 % (03/04 0530)  Intake/Output from previous day:  Intake/Output Summary (Last 24 hours) at 12/23/13 1013 Last data filed at 12/23/13 0819  Gross per 24 hour  Intake    480 ml  Output   1350 ml  Net   -870 ml    Intake/Output this shift: Total I/O In: 240 [P.O.:240] Out: -   Labs:  Recent Labs  12/22/13 0435 12/23/13 0400  HGB 10.6* 9.7*    Recent Labs  12/22/13 0435 12/23/13 0400  WBC 11.6* 11.4*  RBC 3.66* 3.45*  HCT 31.0* 29.8*  PLT 177 163    Recent Labs  12/22/13 0435 12/23/13 0400  NA 138 136*  K 4.5 4.6  CL 102 98  CO2 22 27  BUN 9 12  CREATININE 0.50 0.54  GLUCOSE 146* 150*  CALCIUM 8.7 9.0   No results found for this basename: LABPT, INR,  in the last 72 hours  EXAM: General - Patient is Alert, Appropriate and Oriented Extremity - Neurovascular intact Sensation intact distally Dorsiflexion/Plantar flexion intact Incision - clean, dry, no drainage, healing Motor Function - intact, moving foot and toes well on exam.   Assessment/Plan: 2 Days Post-Op Procedure(s) (LRB): LEFT TOTAL KNEE ARTHROPLASTY (Left) Procedure(s) (LRB): LEFT TOTAL KNEE ARTHROPLASTY (Left) Past Medical History  Diagnosis Date  . Palpitation     hx  . Osteoarthritis   . Hypertension   . Hx of colonic polyp   . Allergy   . GERD (gastroesophageal reflux disease)   . Hyperlipidemia   . Asthma     infrequent problem  . Bruises  easily   . DDD (degenerative disc disease)    Principal Problem:   OA (osteoarthritis) of knee Active Problems:   HYPERLIPIDEMIA   HYPERTENSION   ASTHMA   GERD (gastroesophageal reflux disease)   Degeneration of intervertebral disc, site unspecified  Estimated body mass index is 37.05 kg/(m^2) as calculated from the following:   Height as of this encounter: 5' 4.5" (1.638 m).   Weight as of this encounter: 99.394 kg (219 lb 2 oz). Up with therapy Discharge home with home health Diet - Cardiac diet Follow up - in 2 weeks Activity - WBAT Disposition - Home Condition Upon Discharge - Good D/C Meds - See DC Summary DVT Prophylaxis - Xarelto  Justis Closser 12/23/2013, 10:13 AM

## 2013-12-23 NOTE — Progress Notes (Signed)
Physical Therapy Treatment Patient Details Name: RYELLE RUVALCABA MRN: [id number] DOB: [birth year] Today's Date: 12/23/2013 Time: 0922-1010 PT Time Calculation (min): 48 min  PT Assessment / Plan / Recommendation  History of Present Illness L TKA   PT Comments   Pt states will use ramp at front of home this date but requested to attempt stairs prior to d/c  Follow Up Recommendations  Home health PT     Does the patient have the potential to tolerate intense rehabilitation     Barriers to Discharge        Equipment Recommendations  None recommended by PT    Recommendations for Other Services OT consult  Frequency 7X/week   Progress towards PT Goals Progress towards PT goals: Progressing toward goals  Plan Current plan remains appropriate    Precautions / Restrictions Precautions Precautions: Knee Required Braces or Orthoses: Knee Immobilizer - Left Knee Immobilizer - Left: Discontinue once straight leg raise with < 10 degree lag Restrictions Weight Bearing Restrictions: No   Pertinent Vitals/Pain 5/10; premed, cold packs provided    Mobility  Bed Mobility Overal bed mobility: Needs Assistance Bed Mobility: Supine to Sit;Sit to Supine Supine to sit: Min guard Sit to supine: Min guard General bed mobility comments: Instructed pt to self assist LLE with RLE Transfers Overall transfer level: Needs assistance Equipment used: Rolling walker (2 wheeled) Transfers: Sit to/from Stand Sit to Stand: Min guard General transfer comment: cues for LE management and use of UEs to self assist Ambulation/Gait Ambulation/Gait assistance: Min guard;Supervision Ambulation Distance (Feet): 150 Feet (and 90) Assistive device: Rolling walker (2 wheeled) Gait Pattern/deviations: Step-to pattern;Decreased step length - right;Decreased step length - left;Shuffle;Trunk flexed Gait velocity: decr General Gait Details: cues for posture, position from RW and increased heel strike on L  Stairs:  Yes Stairs assistance: Min assist Stair Management: One rail Right;Step to pattern;With crutches;Forwards Number of Stairs: 4 General stair comments: cues for sequence and foot/crutch placement    Exercises Total Joint Exercises Ankle Circles/Pumps: AROM;Both;Supine;20 reps Quad Sets: AROM;Both;Supine;20 reps Heel Slides: AAROM;Left;Supine;20 reps Straight Leg Raises: Left;Supine;AAROM;AROM;20 reps   PT Diagnosis:    PT Problem List:   PT Treatment Interventions:     PT Goals (current goals can now be found in the care plan section) Acute Rehab PT Goals 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 [employer] PT Goal Formulation: With patient/family Time For Goal Achievement: 01/04/14 Potential to Achieve Goals: Good  Visit Information  Last PT Received On: 12/23/13 Assistance Needed: +1 History of Present Illness: L TKA    Subjective Data  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

## 2014-01-14 ENCOUNTER — Encounter: Payer: Self-pay | Admitting: Family Medicine

## 2014-01-26 ENCOUNTER — Other Ambulatory Visit (INDEPENDENT_AMBULATORY_CARE_PROVIDER_SITE_OTHER): Payer: 59

## 2014-01-26 DIAGNOSIS — Z0000 Encounter for general adult medical examination without abnormal findings: Secondary | ICD-10-CM

## 2014-01-26 LAB — HEPATIC FUNCTION PANEL
ALBUMIN: 3.9 g/dL (ref 3.5–5.2)
ALT: 18 U/L (ref 0–35)
AST: 18 U/L (ref 0–37)
Alkaline Phosphatase: 67 U/L (ref 39–117)
Bilirubin, Direct: 0 mg/dL (ref 0.0–0.3)
TOTAL PROTEIN: 7.6 g/dL (ref 6.0–8.3)
Total Bilirubin: 0.8 mg/dL (ref 0.3–1.2)

## 2014-01-26 LAB — CBC WITH DIFFERENTIAL/PLATELET
BASOS ABS: 0 10*3/uL (ref 0.0–0.1)
BASOS PCT: 0.3 % (ref 0.0–3.0)
Eosinophils Absolute: 0.2 10*3/uL (ref 0.0–0.7)
Eosinophils Relative: 2.5 % (ref 0.0–5.0)
HCT: 37.6 % (ref 36.0–46.0)
HEMOGLOBIN: 12.3 g/dL (ref 12.0–15.0)
Lymphocytes Relative: 21 % (ref 12.0–46.0)
Lymphs Abs: 1.3 10*3/uL (ref 0.7–4.0)
MCHC: 32.7 g/dL (ref 30.0–36.0)
MCV: 84.2 fl (ref 78.0–100.0)
MONOS PCT: 8.3 % (ref 3.0–12.0)
Monocytes Absolute: 0.5 10*3/uL (ref 0.1–1.0)
Neutro Abs: 4.1 10*3/uL (ref 1.4–7.7)
Neutrophils Relative %: 67.9 % (ref 43.0–77.0)
Platelets: 283 10*3/uL (ref 150.0–400.0)
RBC: 4.46 Mil/uL (ref 3.87–5.11)
RDW: 15.1 % — AB (ref 11.5–14.6)
WBC: 6 10*3/uL (ref 4.5–10.5)

## 2014-01-26 LAB — LIPID PANEL
Cholesterol: 161 mg/dL (ref 0–200)
HDL: 30.2 mg/dL — ABNORMAL LOW (ref >39.00)
LDL Cholesterol: 97 mg/dL (ref 0–99)
Total CHOL/HDL Ratio: 5
Triglycerides: 167 mg/dL — ABNORMAL HIGH (ref 0.0–149.0)
VLDL: 33.4 mg/dL (ref 0.0–40.0)

## 2014-01-26 LAB — POCT URINALYSIS DIPSTICK
Bilirubin, UA: NEGATIVE
Glucose, UA: NEGATIVE
KETONES UA: NEGATIVE
LEUKOCYTES UA: NEGATIVE
Nitrite, UA: NEGATIVE
PH UA: 5.5
Protein, UA: NEGATIVE
Spec Grav, UA: <=1.005
Urobilinogen, UA: 0.2

## 2014-01-26 LAB — BASIC METABOLIC PANEL
BUN: 10 mg/dL (ref 6–23)
CALCIUM: 9.5 mg/dL (ref 8.4–10.5)
CO2: 28 mEq/L (ref 19–32)
Chloride: 101 mEq/L (ref 96–112)
Creatinine, Ser: 0.5 mg/dL (ref 0.4–1.2)
GFR: 123.55 mL/min (ref >60.00)
Glucose, Bld: 88 mg/dL (ref 70–99)
Potassium: 4 mEq/L (ref 3.5–5.1)
SODIUM: 137 meq/L (ref 135–145)

## 2014-01-26 LAB — TSH: TSH: 1.24 u[IU]/mL (ref 0.35–5.50)

## 2014-01-28 ENCOUNTER — Other Ambulatory Visit: Payer: Self-pay | Admitting: Family Medicine

## 2014-01-29 ENCOUNTER — Encounter (HOSPITAL_COMMUNITY): Payer: Self-pay | Admitting: *Deleted

## 2014-01-29 NOTE — Progress Notes (Signed)
SAMEDAY SURGERY INSTRUCTIONS AND CHLORHEXIDINE INSTRUCTIONS / PRECAUTIONS DISCUSSED WITH PT BY PHONE

## 2014-02-01 ENCOUNTER — Ambulatory Visit (INDEPENDENT_AMBULATORY_CARE_PROVIDER_SITE_OTHER): Payer: 59 | Admitting: Family Medicine

## 2014-02-01 ENCOUNTER — Encounter: Payer: Self-pay | Admitting: Family Medicine

## 2014-02-01 VITALS — BP 140/74 | HR 90 | Temp 98.8°F | Ht 65.0 in | Wt 216.0 lb

## 2014-02-01 DIAGNOSIS — Z0000 Encounter for general adult medical examination without abnormal findings: Secondary | ICD-10-CM

## 2014-02-01 MED ORDER — OMEPRAZOLE 40 MG PO CPDR: DELAYED_RELEASE_CAPSULE | ORAL | Status: DC

## 2014-02-01 MED ORDER — DILTIAZEM HCL 120 MG PO TABS: 120.0000 mg | ORAL_TABLET | Freq: Every morning | ORAL | Status: DC

## 2014-02-01 MED ORDER — RAMIPRIL 5 MG PO CAPS: ORAL_CAPSULE | ORAL | Status: DC

## 2014-02-01 MED ORDER — ROSUVASTATIN CALCIUM 10 MG PO TABS: ORAL_TABLET | ORAL | Status: DC

## 2014-02-01 NOTE — Progress Notes (Signed)
   Subjective:    Patient ID: Natalie Hall, female    DOB: Apr 06, 1951, 63 y.o.   MRN: [id number]  HPI 63 yr old female for a cpx. She is doing well other than her left knee. She recently had a total replacement but she has had restricted ROM despite PT, so she is scheduled soon for a manipulation under anesthesia.    Review of Systems  Constitutional: Negative.   HENT: Negative.   Eyes: Negative.   Respiratory: Negative.   Cardiovascular: Negative.   Gastrointestinal: Negative.   Genitourinary: Negative for dysuria, urgency, frequency, hematuria, flank pain, decreased urine volume, enuresis, difficulty urinating, pelvic pain and dyspareunia.  Musculoskeletal: Negative.   Skin: Negative.   Neurological: Negative.   Psychiatric/Behavioral: Negative.        Objective:   Physical Exam  Constitutional: She is oriented to person, place, and time. She appears well-developed and well-nourished. No distress.  HENT:  Head: Normocephalic and atraumatic.  Right Ear: External ear normal.  Left Ear: External ear normal.  Nose: Nose normal.  Mouth/Throat: Oropharynx is clear and moist. No oropharyngeal exudate.  Eyes: Conjunctivae and EOM are normal. Pupils are equal, round, and reactive to light. No scleral icterus.  Neck: Normal range of motion. Neck supple. No JVD present. No thyromegaly present.  Cardiovascular: Normal rate, regular rhythm, normal heart sounds and intact distal pulses.  Exam reveals no gallop and no friction rub.   No murmur heard. Pulmonary/Chest: Effort normal and breath sounds normal. No respiratory distress. She has no wheezes. She has no rales. She exhibits no tenderness.  Abdominal: Soft. Bowel sounds are normal. She exhibits no distension and no mass. There is no tenderness. There is no rebound and no guarding.  Musculoskeletal: Normal range of motion. She exhibits no edema and no tenderness.  Lymphadenopathy:    She has no cervical adenopathy.  Neurological: She  is alert and oriented to person, place, and time. She has normal reflexes. No cranial nerve deficit. She exhibits normal muscle tone. Coordination normal.  Skin: Skin is warm and dry. No rash noted. No erythema.  Psychiatric: She has a normal mood and affect. Her behavior is normal. Judgment and thought content normal.          Assessment & Plan:  Well exam.

## 2014-02-01 NOTE — Progress Notes (Signed)
Pre visit review using our clinic review tool, if applicable. No additional management support is needed unless otherwise documented below in the visit note. 

## 2014-02-02 NOTE — H&P (Signed)
CC- RYNLEE LISBON is a 63 y.o. female who presents with left knee stiffness.  HPI- . Knee Pain: Patient presents with stiffness involving the  left knee. Onset of the symptoms was several weeks ago. Inciting event: She had a left total knee rthroplasty approximately 6 weeks ago and has had stiffness refractory to PT. She has had adequate PT bur has significant limitations in motion without any improvements in past few weeks.. Current symptoms include stiffness. Pain is aggravated by inactivity and walking.  Patient has had prior knee problems. Evaluation to date: PT evaluation no improvement with PT. Treatment to date: PT which was not very effective.  Past Medical History  Diagnosis Date  . Palpitation     hx  . Osteoarthritis   . Hypertension   . Hx of colonic polyp   . Allergy   . GERD (gastroesophageal reflux disease)   . Hyperlipidemia   . Asthma     infrequent problem  . Bruises easily   . DDD (degenerative disc disease)     Past Surgical History  Procedure Laterality Date  . Breast biopsy    . Carpal tunnel release    . Cholecystectomy    . Abdominal hysterectomy    . Varicose vein surgery    . Synvisc inj      dr Maureen Ralphs 2008  . Colonoscopy  03-26-11    per Dr. Ardis Hughs, 3 tubular adenomas, repeat in 3 yrs   . Polypectomy    . Ankle surgery      torn tendon left ankle  . Achilles tendon repair  10-07-11    torn on right, per Dr. Noemi Chapel   . Total knee arthroplasty Left 12/21/2013    Procedure: LEFT TOTAL KNEE ARTHROPLASTY;  Surgeon: Gearlean Alf, MD;  Location: [facility];  Service: Orthopedics;  Laterality: Left;    Prior to Admission medications   Medication Sig Start Date End Date Taking? Authorizing Provider  aspirin 81 MG tablet Take 81 mg by mouth daily. Pt states will finish aspirin Monday 02/01/14-was taking as blood thinner following total knee surgery   Yes Historical Provider, MD  temazepam (RESTORIL) 15 MG capsule Take 15 mg by mouth at bedtime as needed for  sleep.   Yes Historical Provider, MD  diltiazem (CARDIZEM) 120 MG tablet Take 1 tablet (120 mg total) by mouth every morning. 02/01/14   Laurey Morale, MD  loratadine (CLARITIN) 10 MG tablet Take 10 mg by mouth daily.    Historical Provider, MD  methocarbamol (ROBAXIN) 500 MG tablet Take 1 tablet (500 mg total) by mouth every 6 (six) hours as needed for muscle spasms. 12/23/13   Alexzandrew Dara Lords, PA-C  niacin (NIASPAN) 500 MG CR tablet Take 1 tablet by mouth at  bedtime 01/28/14   Laurey Morale, MD  omeprazole (PRILOSEC) 40 MG capsule Take 1 capsule by mouth  daily. 02/01/14   Laurey Morale, MD  oxyCODONE (OXY IR/ROXICODONE) 5 MG immediate release tablet Take 1-2 tablets (5-10 mg total) by mouth every 3 (three) hours as needed for moderate pain, severe pain or breakthrough pain. 12/23/13   Alexzandrew Dara Lords, PA-C  Polyethyl Glycol-Propyl Glycol (SYSTANE OP) Apply 1 drop to eye daily as needed (Dry eyes).    Historical Provider, MD  ramipril (ALTACE) 5 MG capsule Take 1 capsule by mouth  daily 02/01/14   Laurey Morale, MD  rivaroxaban (XARELTO) 10 MG TABS tablet Take 1 tablet (10 mg total) by mouth daily with breakfast. Take Xarelto  for two and a half more weeks, then discontinue Xarelto. Once the patient has completed the blood thinner regimen, then take a Baby 81 mg Aspirin daily for four more weeks. 12/23/13   Alexzandrew Dara Lords, PA-C  rosuvastatin (CRESTOR) 10 MG tablet Take 1 tablet by mouth  daily 02/01/14   Laurey Morale, MD  traMADol (ULTRAM) 50 MG tablet Take 1-2 tablets (50-100 mg total) by mouth every 6 (six) hours as needed (mild to moderate pain). 12/23/13   Alexzandrew Perkins, PA-C  triamcinolone (NASACORT) 55 MCG/ACT nasal inhaler Place 2 sprays into the nose daily. 01/29/13   Laurey Morale, MD   KNEE EXAM antalgic gait,No effusion or warmth, range of motion 5-85 degrees, no laxity  Physical Examination: General appearance - alert, well appearing, and in no distress Mental status - alert,  oriented to person, place, and time Chest - clear to auscultation, no wheezes, rales or rhonchi, symmetric air entry Heart - normal rate, regular rhythm, normal S1, S2, no murmurs, rubs, clicks or gallops Abdomen - soft, nontender, nondistended, no masses or organomegaly Neurological - alert, oriented, normal speech, no focal findings or movement disorder noted    Asessment/Plan--- Left knee arthrofibrosis, Plan left knee closed manipulation. Procedure risks and potential comps discussed with patient who elects to proceed. Goals are decreased pain and increased function with a high likelihood of achieving both

## 2014-02-03 ENCOUNTER — Ambulatory Visit (HOSPITAL_COMMUNITY)
Admission: RE | Admit: 2014-02-03 | Discharge: 2014-02-03 | Disposition: A | Discharge status: Home / Self Care | Payer: 59 | Source: Ambulatory Visit | Attending: Orthopedic Surgery | Admitting: Orthopedic Surgery

## 2014-02-03 ENCOUNTER — Encounter (HOSPITAL_COMMUNITY): Payer: Self-pay | Admitting: *Deleted

## 2014-02-03 ENCOUNTER — Ambulatory Visit (HOSPITAL_COMMUNITY): Payer: 59 | Admitting: Anesthesiology

## 2014-02-03 ENCOUNTER — Encounter (HOSPITAL_COMMUNITY)
Admission: RE | Disposition: A | Discharge status: Home / Self Care | Payer: Self-pay | Source: Ambulatory Visit | Attending: Orthopedic Surgery

## 2014-02-03 ENCOUNTER — Encounter (HOSPITAL_COMMUNITY): Payer: 59 | Admitting: Anesthesiology

## 2014-02-03 ENCOUNTER — Encounter (HOSPITAL_COMMUNITY): Payer: Self-pay | Admitting: Pharmacy Technician

## 2014-02-03 DIAGNOSIS — Z9089 Acquired absence of other organs: Secondary | ICD-10-CM | POA: Insufficient documentation

## 2014-02-03 DIAGNOSIS — Z7982 Long term (current) use of aspirin: Secondary | ICD-10-CM | POA: Insufficient documentation

## 2014-02-03 DIAGNOSIS — I498 Other specified cardiac arrhythmias: Secondary | ICD-10-CM | POA: Insufficient documentation

## 2014-02-03 DIAGNOSIS — Z79899 Other long term (current) drug therapy: Secondary | ICD-10-CM | POA: Insufficient documentation

## 2014-02-03 DIAGNOSIS — T8489XA Other specified complication of internal orthopedic prosthetic devices, implants and grafts, initial encounter: Secondary | ICD-10-CM

## 2014-02-03 DIAGNOSIS — J45909 Unspecified asthma, uncomplicated: Secondary | ICD-10-CM | POA: Insufficient documentation

## 2014-02-03 DIAGNOSIS — M25669 Stiffness of unspecified knee, not elsewhere classified: Secondary | ICD-10-CM | POA: Diagnosis present

## 2014-02-03 DIAGNOSIS — M24669 Ankylosis, unspecified knee: Secondary | ICD-10-CM | POA: Insufficient documentation

## 2014-02-03 DIAGNOSIS — Z96659 Presence of unspecified artificial knee joint: Secondary | ICD-10-CM | POA: Diagnosis present

## 2014-02-03 DIAGNOSIS — I10 Essential (primary) hypertension: Secondary | ICD-10-CM | POA: Insufficient documentation

## 2014-02-03 DIAGNOSIS — M2460 Ankylosis, unspecified joint: Principal | ICD-10-CM

## 2014-02-03 DIAGNOSIS — K219 Gastro-esophageal reflux disease without esophagitis: Secondary | ICD-10-CM | POA: Insufficient documentation

## 2014-02-03 HISTORY — PX: KNEE CLOSED REDUCTION: SHX995

## 2014-02-03 SURGERY — MANIPULATION, KNEE, CLOSED
Anesthesia: General | Site: Knee | Laterality: Left

## 2014-02-03 MED ORDER — FENTANYL CITRATE 0.05 MG/ML IJ SOLN
INTRAMUSCULAR | Status: AC
  Filled 2014-02-03: qty 2

## 2014-02-03 MED ORDER — FENTANYL CITRATE 0.05 MG/ML IJ SOLN
25.0000 ug | INTRAMUSCULAR | Status: DC | PRN
  Administered 2014-02-03 (×3): 50 ug via INTRAVENOUS

## 2014-02-03 MED ORDER — OXYCODONE HCL 5 MG PO TABS: 5.0000 mg | ORAL_TABLET | ORAL | Status: DC | PRN

## 2014-02-03 MED ORDER — ACETAMINOPHEN 10 MG/ML IV SOLN
1000.0000 mg | Freq: Once | INTRAVENOUS | Status: AC
  Administered 2014-02-03: 1000 mg via INTRAVENOUS
  Filled 2014-02-03: qty 100

## 2014-02-03 MED ORDER — PROPOFOL 10 MG/ML IV BOLUS
INTRAVENOUS | Status: AC
Start: 2014-02-03 — End: 2014-02-03
  Filled 2014-02-03: qty 20

## 2014-02-03 MED ORDER — LACTATED RINGERS IV SOLN
INTRAVENOUS | Status: DC
  Administered 2014-02-03: 16:00:00 via INTRAVENOUS

## 2014-02-03 MED ORDER — HYDROMORPHONE HCL PF 1 MG/ML IJ SOLN
0.2500 mg | INTRAMUSCULAR | Status: DC | PRN
  Administered 2014-02-03: 0.25 mg via INTRAVENOUS

## 2014-02-03 MED ORDER — LACTATED RINGERS IV SOLN: INTRAVENOUS | Status: DC

## 2014-02-03 MED ORDER — HYDROMORPHONE HCL PF 1 MG/ML IJ SOLN
INTRAMUSCULAR | Status: AC
  Filled 2014-02-03: qty 1

## 2014-02-03 MED ORDER — OXYCODONE HCL 5 MG PO TABS
5.0000 mg | ORAL_TABLET | ORAL | Status: DC | PRN
  Administered 2014-02-03: 5 mg via ORAL

## 2014-02-03 MED ORDER — FENTANYL CITRATE 0.05 MG/ML IJ SOLN
INTRAMUSCULAR | Status: DC | PRN
  Administered 2014-02-03 (×2): 50 ug via INTRAVENOUS

## 2014-02-03 MED ORDER — CEFAZOLIN SODIUM-DEXTROSE 2-3 GM-% IV SOLR: 2.0000 g | INTRAVENOUS | Status: DC

## 2014-02-03 MED ORDER — OXYCODONE HCL 5 MG PO TABS
ORAL_TABLET | ORAL | Status: AC
  Administered 2014-02-03: 5 mg via ORAL
  Filled 2014-02-03: qty 1

## 2014-02-03 MED ORDER — ONDANSETRON HCL 4 MG/2ML IJ SOLN
INTRAMUSCULAR | Status: DC | PRN
  Administered 2014-02-03: 4 mg via INTRAVENOUS

## 2014-02-03 MED ORDER — PROPOFOL 10 MG/ML IV BOLUS
INTRAVENOUS | Status: DC | PRN
  Administered 2014-02-03: 180 mg via INTRAVENOUS

## 2014-02-03 MED ORDER — LIDOCAINE HCL (CARDIAC) 20 MG/ML IV SOLN
INTRAVENOUS | Status: AC
  Filled 2014-02-03: qty 5

## 2014-02-03 MED ORDER — LIDOCAINE HCL (CARDIAC) 20 MG/ML IV SOLN
INTRAVENOUS | Status: DC | PRN
  Administered 2014-02-03: 100 mg via INTRAVENOUS

## 2014-02-03 SURGICAL SUPPLY — 13 items
BANDAGE ADH SHEER 1  50/CT (GAUZE/BANDAGES/DRESSINGS) IMPLANT
GLOVE BIO SURGEON STRL SZ7.5 (GLOVE) ×1 IMPLANT
GLOVE BIOGEL PI IND STRL 8 (GLOVE) ×1 IMPLANT
GLOVE BIOGEL PI INDICATOR 8 (GLOVE)
GOWN STRL REUS W/TWL LRG LVL3 (GOWN DISPOSABLE) ×1 IMPLANT
GOWN STRL REUS W/TWL XL LVL3 (GOWN DISPOSABLE) ×1 IMPLANT
MANIFOLD NEPTUNE II (INSTRUMENTS) ×1 IMPLANT
NDL SAFETY ECLIPSE 18X1.5 (NEEDLE) IMPLANT
NEEDLE HYPO 18GX1.5 SHARP (NEEDLE)
POSITIONER SURGICAL ARM (MISCELLANEOUS) ×1 IMPLANT
SPONGE GAUZE 4X4 12PLY (GAUZE/BANDAGES/DRESSINGS) IMPLANT
SYR CONTROL 10ML LL (SYRINGE) IMPLANT
TOWEL OR 17X26 10 PK STRL BLUE (TOWEL DISPOSABLE) ×2 IMPLANT

## 2014-02-03 NOTE — Anesthesia Preprocedure Evaluation (Signed)
Anesthesia Evaluation  Patient identified by MRN, date of birth, ID band Patient awake    Reviewed: Allergy & Precautions, H&P , NPO status , Patient's Chart, lab work & pertinent test results  Airway Mallampati: II TM Distance: >3 FB Neck ROM: full    Dental no notable dental hx. (+) Teeth Intact, Dental Advisory Given   Pulmonary neg pulmonary ROS, asthma ,  Mild, infrequent asthma breath sounds clear to auscultation  Pulmonary exam normal       Cardiovascular Exercise Tolerance: Good hypertension, Pt. on medications + dysrhythmias Supra Ventricular Tachycardia Rhythm:regular Rate:Normal     Neuro/Psych negative neurological ROS  negative psych ROS   GI/Hepatic negative GI ROS, Neg liver ROS, GERD-  Medicated and Controlled,  Endo/Other  negative endocrine ROS  Renal/GU negative Renal ROS  negative genitourinary   Musculoskeletal   Abdominal   Peds  Hematology negative hematology ROS (+)   Anesthesia Other Findings   Reproductive/Obstetrics negative OB ROS                           Anesthesia Physical Anesthesia Plan  ASA: II  Anesthesia Plan: General   Post-op Pain Management:    Induction: Intravenous  Airway Management Planned: LMA  Additional Equipment:   Intra-op Plan:   Post-operative Plan:   Informed Consent: I have reviewed the patients History and Physical, chart, labs and discussed the procedure including the risks, benefits and alternatives for the proposed anesthesia with the patient or authorized representative who has indicated his/her understanding and acceptance.   Dental Advisory Given  Plan Discussed with: CRNA and Surgeon  Anesthesia Plan Comments:         Anesthesia Quick Evaluation

## 2014-02-03 NOTE — Anesthesia Postprocedure Evaluation (Signed)
  Anesthesia Post-op Note  Patient: Natalie Hall  Procedure(s) Performed: Procedure(s) (LRB): CLOSED MANIPULATION LEFT KNEE (Left)  Patient Location: PACU  Anesthesia Type: General  Level of Consciousness: awake and alert   Airway and Oxygen Therapy: Patient Spontanous Breathing  Post-op Pain: mild  Post-op Assessment: Post-op Vital signs reviewed, Patient's Cardiovascular Status Stable, Respiratory Function Stable, Patent Airway and No signs of Nausea or vomiting  Last Vitals:  Filed Vitals:   02/03/14 1810  BP: 143/67  Pulse: 72  Temp: 36.7 C  Resp: 15    Post-op Vital Signs: stable   Complications: No apparent anesthesia complications

## 2014-02-03 NOTE — Op Note (Signed)
  OPERATIVE REPORT   PREOPERATIVE DIAGNOSIS: Arthrofibrosis, Left  knee.   POSTOPERATIVE DIAGNOSIS: Arthrofibrosis, Left knee.   PROCEDURE:  Left  knee closed manipulation.   SURGEON: Gaynelle Arabian, MD   ASSISTANT: None.   ANESTHESIA: General.   COMPLICATIONS: None.   CONDITION: Stable to Recovery.   Pre-manipulation range of motion is 5-85.  Post-manipulation range of  Motion is 0-125  PROCEDURE IN DETAIL: After successful administration of general  anesthetic, exam under anesthesia was performed showing range of motion  5-85 degrees. I then placed my chest against the proximal tibia,  flexing the knee with audible lysis of adhesions. I was easily able to  get the knee flexed to 125  degrees. I then put the knee back in extension and with some  patellar manipulation and gentle pressure got to full  Extension. Her patellar mobility was also significantly improved. The patient was subsequently awakened and transported to Recovery in  stable condition.

## 2014-02-03 NOTE — Transfer of Care (Signed)
Immediate Anesthesia Transfer of Care Note  Patient: Natalie Hall  Procedure(s) Performed: Procedure(s): CLOSED MANIPULATION LEFT KNEE (Left)  Patient Location: PACU  Anesthesia Type:General  Level of Consciousness: awake, alert  and oriented  Airway & Oxygen Therapy: Patient Spontanous Breathing and Patient connected to face mask oxygen  Post-op Assessment: Report given to PACU RN and Post -op Vital signs reviewed and stable  Post vital signs: Reviewed and stable  Complications: No apparent anesthesia complications

## 2014-02-03 NOTE — Interval H&P Note (Signed)
History and Physical Interval Note:  02/03/2014 4:33 PM  Natalie Hall  has presented today for surgery, with the diagnosis of LEFT KNEE ARTHROFIBROSIS   The various methods of treatment have been discussed with the patient and family. After consideration of risks, benefits and other options for treatment, the patient has consented to  Procedure(s): CLOSED MANIPULATION LEFT KNEE (Left) as a surgical intervention .  The patient's history has been reviewed, patient examined, no change in status, stable for surgery.  I have reviewed the patient's chart and labs.  Questions were answered to the patient's satisfaction.     Dione Plover Tonya Wantz

## 2014-02-04 ENCOUNTER — Encounter (HOSPITAL_COMMUNITY): Payer: Self-pay | Admitting: Orthopedic Surgery

## 2014-02-20 ENCOUNTER — Encounter: Payer: Self-pay | Admitting: Gastroenterology

## 2014-02-25 ENCOUNTER — Encounter: Payer: Self-pay | Admitting: Gastroenterology

## 2014-04-15 ENCOUNTER — Other Ambulatory Visit: Payer: Self-pay | Admitting: Family Medicine

## 2014-05-21 ENCOUNTER — Encounter: Payer: 59 | Admitting: Gastroenterology

## 2014-05-27 ENCOUNTER — Encounter: Payer: Self-pay | Admitting: Gastroenterology

## 2014-06-09 ENCOUNTER — Ambulatory Visit (AMBULATORY_SURGERY_CENTER): Payer: Self-pay

## 2014-06-09 VITALS — Ht 64.5 in | Wt 217.0 lb

## 2014-06-09 DIAGNOSIS — Z860100 Personal history of colon polyps, unspecified: Secondary | ICD-10-CM

## 2014-06-09 MED ORDER — MOVIPREP 100 G PO SOLR: 1.0000 | Freq: Once | ORAL | Status: DC

## 2014-06-09 NOTE — Progress Notes (Signed)
No allergies to eggs or soy No past problems with anesthesia No home oxygen No diet/weight loss meds  Has email  Emmi instructions given for colonoscopy 

## 2014-06-23 ENCOUNTER — Ambulatory Visit (AMBULATORY_SURGERY_CENTER): Payer: 59 | Admitting: Gastroenterology

## 2014-06-23 ENCOUNTER — Encounter: Payer: Self-pay | Admitting: Gastroenterology

## 2014-06-23 VITALS — BP 140/69 | HR 70 | Temp 98.3°F | Resp 17 | Ht 64.5 in | Wt 217.0 lb

## 2014-06-23 DIAGNOSIS — D126 Benign neoplasm of colon, unspecified: Secondary | ICD-10-CM

## 2014-06-23 DIAGNOSIS — K5730 Diverticulosis of large intestine without perforation or abscess without bleeding: Secondary | ICD-10-CM

## 2014-06-23 DIAGNOSIS — Z86010 Personal history of colonic polyps: Secondary | ICD-10-CM

## 2014-06-23 HISTORY — PX: COLONOSCOPY: SHX174

## 2014-06-23 MED ORDER — SODIUM CHLORIDE 0.9 % IV SOLN: 500.0000 mL | INTRAVENOUS | Status: DC

## 2014-06-23 NOTE — Progress Notes (Signed)
Called to room to assist during endoscopic procedure.  Patient ID and intended procedure confirmed with present staff. Received instructions for my participation in the procedure from the performing physician.  

## 2014-06-23 NOTE — Progress Notes (Signed)
Report to PACU, RN, vss, BBS= Clear.  

## 2014-06-23 NOTE — Op Note (Signed)
[facility]  [address] [place], [id number]   COLONOSCOPY PROCEDURE REPORT  PATIENT: Natalie, Hall  MR#: [id number] BIRTHDATE: May 16, 1951 , 63  yrs. old GENDER: Female ENDOSCOPIST: Milus Banister, MD PROCEDURE DATE:  06/23/2014 PROCEDURE:   Colonoscopy with snare polypectomy First Screening Colonoscopy - Avg.  risk and is 50 yrs.  old or older - No.  Prior Negative Screening - Now for repeat screening. N/A  History of Adenoma - Now for follow-up colonoscopy & has been > or = to 3 yrs.  Yes hx of adenoma.  Has been 3 or more years since last colonoscopy.  Polyps Removed Today? Yes. ASA CLASS:   Class II INDICATIONS:adenomatous polyps (3 small TAs removed 2012 Dr.  Ardis Hughs colonoscopy); also TA removed Dr.  Lajoyce Corners remotely. MEDICATIONS: MAC sedation, administered by CRNA and Propofol (Diprivan) 270 mg IV  DESCRIPTION OF PROCEDURE:   After the risks benefits and alternatives of the procedure were thoroughly explained, informed consent was obtained.  A digital rectal exam revealed no abnormalities of the rectum.   The LB [id number]  endoscope was introduced through the anus and advanced to the cecum, which was identified by both the appendix and ileocecal valve. No adverse events experienced.   The quality of the prep was good.  The instrument was then slowly withdrawn as the colon was fully examined.  COLON FINDINGS: Two polyps were found, removed and sent to pathology.  These were sessile, 2-38mm across, located in cecum and transverse segments, removed with cold snare.  There were a few small left sided diverticululm.  The examination was othierwe normal.  Retroflexed views revealed no abnormalities. The time to cecum=3 minutes 06 seconds.  Withdrawal time=9 minutes 55 seconds. The scope was withdrawn and the procedure completed. COMPLICATIONS: There were no complications.  ENDOSCOPIC IMPRESSION: Two polyps were found, removed and sent to  pathology. There were a few small left sided diverticululm. The examination was othierwe normal.  RECOMMENDATIONS: If the polyp(s) removed today are proven to be adenomatous (pre-cancerous) polyps, you will need a repeat colonoscopy in 5 years.  You will receive a letter within 1-2 weeks with the results of your biopsy as well as final recommendations.  Please call my office if you have not received a letter after 3 weeks.   eSigned:  Milus Banister, MD 06/23/2014 9:27 AM   cc: Alysia Penna, MD

## 2014-06-23 NOTE — Patient Instructions (Signed)
YOU HAD AN ENDOSCOPIC PROCEDURE TODAY AT THE  ENDOSCOPY CENTER: Refer to the procedure report that was given to you for any specific questions about what was found during the examination.  If the procedure report does not answer your questions, please call your gastroenterologist to clarify.  If you requested that your care partner not be given the details of your procedure findings, then the procedure report has been included in a sealed envelope for you to review at your convenience later.  YOU SHOULD EXPECT: Some feelings of bloating in the abdomen. Passage of more gas than usual.  Walking can help get rid of the air that was put into your GI tract during the procedure and reduce the bloating. If you had a lower endoscopy (such as a colonoscopy or flexible sigmoidoscopy) you may notice spotting of blood in your stool or on the toilet paper. If you underwent a bowel prep for your procedure, then you may not have a normal bowel movement for a few days.  DIET: Your first meal following the procedure should be a light meal and then it is ok to progress to your normal diet.  A half-sandwich or bowl of soup is an example of a good first meal.  Heavy or fried foods are harder to digest and may make you feel nauseous or bloated.  Likewise meals heavy in dairy and vegetables can cause extra gas to form and this can also increase the bloating.  Drink plenty of fluids but you should avoid alcoholic beverages for 24 hours.  ACTIVITY: Your care partner should take you home directly after the procedure.  You should plan to take it easy, moving slowly for the rest of the day.  You can resume normal activity the day after the procedure however you should NOT DRIVE or use heavy machinery for 24 hours (because of the sedation medicines used during the test).    SYMPTOMS TO REPORT IMMEDIATELY: A gastroenterologist can be reached at any hour.  During normal business hours, 8:30 AM to 5:00 PM Monday through Friday,  call [phone].  After hours and on weekends, please call the GI answering service at [phone] who will take a message and have the physician on call contact you.   Following lower endoscopy (colonoscopy or flexible sigmoidoscopy):  Excessive amounts of blood in the stool  Significant tenderness or worsening of abdominal pains  Swelling of the abdomen that is new, acute  Fever of 100F or higher  FOLLOW UP: If any biopsies were taken you will be contacted by phone or by letter within the next 1-3 weeks.  Call your gastroenterologist if you have not heard about the biopsies in 3 weeks.  Our staff will call the home number listed on your records the next business day following your procedure to check on you and address any questions or concerns that you may have at that time regarding the information given to you following your procedure. This is a courtesy call and so if there is no answer at the home number and we have not heard from you through the emergency physician on call, we will assume that you have returned to your regular daily activities without incident.  SIGNATURES/CONFIDENTIALITY: You and/or your care partner have signed paperwork which will be entered into your electronic medical record.  These signatures attest to the fact that that the information above on your After Visit Summary has been reviewed and is understood.  Full responsibility of the confidentiality of this   discharge information lies with you and/or your care-partner.    Resume medications. Information given on polyps, diverticulosis and high fiber diet with discharge instructions.       Diverticulosis Diverticulosis is the condition that develops when small pouches (diverticula) form in the [place]heimer of your colon. Your colon, or large intestine, is where water is absorbed and stool is formed. The pouches form when the inside layer of your colon pushes through weak spots in the outer layers of your  colon. CAUSES  No one knows exactly what causes diverticulosis. RISK FACTORS  Being older than 70. Your risk for this condition increases with age. Diverticulosis is rare in people younger than 40 years. By age 90, almost everyone has it.  Eating a low-fiber diet.  Being frequently constipated.  Being overweight.  Not getting enough exercise.  Smoking.  Taking over-the-counter pain medicines, like aspirin and ibuprofen. SYMPTOMS  Most people with diverticulosis do not have symptoms. DIAGNOSIS  Because diverticulosis often has no symptoms, health care providers often discover the condition during an exam for other colon problems. In many cases, a health care provider will diagnose diverticulosis while using a flexible scope to examine the colon (colonoscopy). TREATMENT  If you have never developed an infection related to diverticulosis, you may not need treatment. If you have had an infection before, treatment may include:  Eating more fruits, vegetables, and grains.  Taking a fiber supplement.  Taking a live bacteria supplement (probiotic).  Taking medicine to relax your colon. HOME CARE INSTRUCTIONS   Drink at least 6-8 glasses of water each day to prevent constipation.  Try not to strain when you have a bowel movement.  Keep all follow-up appointments. If you have had an infection before:  Increase the fiber in your diet as directed by your health care provider or dietitian.  Take a dietary fiber supplement if your health care provider approves.  Only take medicines as directed by your health care provider. SEEK MEDICAL CARE IF:   You have abdominal pain.  You have bloating.  You have cramps.  You have not gone to the bathroom in 3 days. SEEK IMMEDIATE MEDICAL CARE IF:   Your pain gets worse.  Yourbloating becomes very bad.  You have a fever or chills, and your symptoms suddenly get worse.  You begin vomiting.  You have bowel movements that are  bloody or black. MAKE SURE YOU:  Understand these instructions.  Will watch your condition.  Will get help right away if you are not doing well or get worse. Document Released: 07/05/2004 Document Revised: 10/13/2013 Document Reviewed: 09/02/2013 [facility] Patient Information 2015 [place], [place]. This information is not intended to replace advice given to you by your health care provider. Make sure you discuss any questions you have with your health care provider.

## 2014-06-24 ENCOUNTER — Telehealth: Payer: Self-pay | Admitting: *Deleted

## 2014-06-24 NOTE — Telephone Encounter (Signed)
  Follow up Call-  Call back number 06/23/2014  Post procedure Call Back phone  # [phone]  Permission to leave phone message Yes     Patient questions:  Do you have a fever, pain , or abdominal swelling? No. Pain Score  0 *  Have you tolerated food without any problems? Yes.    Have you been able to return to your normal activities? Yes.    Do you have any questions about your discharge instructions: Diet   No. Medications  No. Follow up visit  No.  Do you have questions or concerns about your Care? No.  Actions: * If pain score is 4 or above: No action needed, pain <4.

## 2014-06-29 ENCOUNTER — Encounter: Payer: Self-pay | Admitting: Gastroenterology

## 2014-07-05 ENCOUNTER — Telehealth: Payer: Self-pay | Admitting: Family Medicine

## 2014-07-05 NOTE — Telephone Encounter (Signed)
Currently the medication Diltiazem 120 mg is on a long term out of stock, per [facility]. Pt did request an alternative for this medication.

## 2014-07-06 NOTE — Telephone Encounter (Signed)
Stop the Diltiazem and start Amlodipine 5 mg daily. Send in one year supply

## 2014-07-07 NOTE — Telephone Encounter (Signed)
I left a voice message for pt to return my call. I need to know if she wants me to send this in to mail order?

## 2014-07-08 MED ORDER — AMLODIPINE BESYLATE 5 MG PO TABS: 5.0000 mg | ORAL_TABLET | Freq: Every day | ORAL | Status: DC

## 2014-07-08 NOTE — Telephone Encounter (Signed)
I spoke with pt and sent script e-scribe to mail order.

## 2014-08-11 ENCOUNTER — Ambulatory Visit: Payer: 59

## 2014-11-22 ENCOUNTER — Telehealth: Payer: Self-pay

## 2014-11-22 NOTE — Telephone Encounter (Signed)
Rx request for for diltiazem.  Looks like this medication was discontinued on 4.13.2015 and it is not on the medication list.  Pls advise.

## 2014-11-23 NOTE — Telephone Encounter (Signed)
NO, she was switched from Diltiazem to Amlodipine last September

## 2014-11-24 ENCOUNTER — Other Ambulatory Visit: Payer: Self-pay | Admitting: Family Medicine

## 2014-11-25 ENCOUNTER — Telehealth: Payer: Self-pay | Admitting: Family Medicine

## 2014-11-25 NOTE — Telephone Encounter (Signed)
Pt request refill diltiazem (CARDIZEM) 120 MG tablet  Pt states the manufacturer now has this med available and pt would like to resume.  [facility]  Pt would like to know if you can send 10 days to  [facility] to get her through to this comes form [facility]

## 2014-11-26 MED ORDER — DILTIAZEM HCL ER COATED BEADS 120 MG PO CP24: 120.0000 mg | ORAL_CAPSULE | Freq: Every day | ORAL | Status: DC

## 2014-11-26 NOTE — Telephone Encounter (Signed)
Is pt supposed to be taking Ramipril?

## 2014-11-26 NOTE — Telephone Encounter (Signed)
Both were sent

## 2014-11-30 ENCOUNTER — Ambulatory Visit: Payer: Self-pay | Admitting: Family Medicine

## 2014-12-31 ENCOUNTER — Encounter: Payer: Self-pay | Admitting: Family Medicine

## 2015-01-27 ENCOUNTER — Other Ambulatory Visit (INDEPENDENT_AMBULATORY_CARE_PROVIDER_SITE_OTHER): Payer: 59

## 2015-01-27 DIAGNOSIS — Z0000 Encounter for general adult medical examination without abnormal findings: Secondary | ICD-10-CM

## 2015-01-27 LAB — POCT URINALYSIS DIPSTICK
BILIRUBIN UA: NEGATIVE
Glucose, UA: NEGATIVE
KETONES UA: NEGATIVE
Leukocytes, UA: NEGATIVE
Nitrite, UA: NEGATIVE
PH UA: 7.5
Protein, UA: NEGATIVE
SPEC GRAV UA: 1.015
Urobilinogen, UA: 0.2

## 2015-01-27 LAB — CBC WITH DIFFERENTIAL/PLATELET
Basophils Absolute: 0 10*3/uL (ref 0.0–0.1)
Basophils Relative: 0.4 % (ref 0.0–3.0)
EOS PCT: 4.4 % (ref 0.0–5.0)
Eosinophils Absolute: 0.2 10*3/uL (ref 0.0–0.7)
HCT: 39 % (ref 36.0–46.0)
HEMOGLOBIN: 13.3 g/dL (ref 12.0–15.0)
LYMPHS PCT: 27.7 % (ref 12.0–46.0)
Lymphs Abs: 1.4 10*3/uL (ref 0.7–4.0)
MCHC: 34 g/dL (ref 30.0–36.0)
MCV: 82.2 fl (ref 78.0–100.0)
Monocytes Absolute: 0.4 10*3/uL (ref 0.1–1.0)
Monocytes Relative: 7.8 % (ref 3.0–12.0)
NEUTROS PCT: 59.7 % (ref 43.0–77.0)
Neutro Abs: 3 10*3/uL (ref 1.4–7.7)
PLATELETS: 211 10*3/uL (ref 150.0–400.0)
RBC: 4.75 Mil/uL (ref 3.87–5.11)
RDW: 15.2 % (ref 11.5–15.5)
WBC: 5 10*3/uL (ref 4.0–10.5)

## 2015-01-27 LAB — LIPID PANEL
Cholesterol: 158 mg/dL (ref 0–200)
HDL: 28.6 mg/dL — ABNORMAL LOW (ref >39.00)
LDL Cholesterol: 101 mg/dL — ABNORMAL HIGH (ref 0–99)
NonHDL: 129.4
Total CHOL/HDL Ratio: 6
Triglycerides: 143 mg/dL (ref 0.0–149.0)
VLDL: 28.6 mg/dL (ref 0.0–40.0)

## 2015-01-27 LAB — BASIC METABOLIC PANEL
BUN: 18 mg/dL (ref 6–23)
CALCIUM: 9.6 mg/dL (ref 8.4–10.5)
CO2: 29 mEq/L (ref 19–32)
CREATININE: 0.66 mg/dL (ref 0.40–1.20)
Chloride: 101 mEq/L (ref 96–112)
GFR: 95.61 mL/min (ref >60.00)
Glucose, Bld: 99 mg/dL (ref 70–99)
Potassium: 4.1 mEq/L (ref 3.5–5.1)
Sodium: 137 mEq/L (ref 135–145)

## 2015-01-27 LAB — HEPATIC FUNCTION PANEL
ALK PHOS: 57 U/L (ref 39–117)
ALT: 28 U/L (ref 0–35)
AST: 27 U/L (ref 0–37)
Albumin: 4.1 g/dL (ref 3.5–5.2)
BILIRUBIN TOTAL: 0.5 mg/dL (ref 0.2–1.2)
Bilirubin, Direct: 0.1 mg/dL (ref 0.0–0.3)
Total Protein: 7.5 g/dL (ref 6.0–8.3)

## 2015-01-27 LAB — TSH: TSH: 1.53 u[IU]/mL (ref 0.35–4.50)

## 2015-02-03 ENCOUNTER — Ambulatory Visit (INDEPENDENT_AMBULATORY_CARE_PROVIDER_SITE_OTHER): Payer: 59 | Admitting: Family Medicine

## 2015-02-03 ENCOUNTER — Encounter: Payer: Self-pay | Admitting: Family Medicine

## 2015-02-03 VITALS — BP 175/81 | HR 92 | Temp 98.6°F | Ht 64.5 in | Wt 217.0 lb

## 2015-02-03 DIAGNOSIS — E785 Hyperlipidemia, unspecified: Secondary | ICD-10-CM | POA: Diagnosis not present

## 2015-02-03 DIAGNOSIS — J0100 Acute maxillary sinusitis, unspecified: Secondary | ICD-10-CM

## 2015-02-03 DIAGNOSIS — F32A Depression, unspecified: Secondary | ICD-10-CM | POA: Insufficient documentation

## 2015-02-03 DIAGNOSIS — F329 Major depressive disorder, single episode, unspecified: Secondary | ICD-10-CM

## 2015-02-03 DIAGNOSIS — Z0000 Encounter for general adult medical examination without abnormal findings: Secondary | ICD-10-CM

## 2015-02-03 MED ORDER — ALBUTEROL SULFATE HFA 108 (90 BASE) MCG/ACT IN AERS: 2.0000 | INHALATION_SPRAY | RESPIRATORY_TRACT | Status: DC | PRN

## 2015-02-03 MED ORDER — ESCITALOPRAM OXALATE 10 MG PO TABS: 10.0000 mg | ORAL_TABLET | Freq: Every day | ORAL | Status: DC

## 2015-02-03 MED ORDER — AMOXICILLIN-POT CLAVULANATE 875-125 MG PO TABS: 1.0000 | ORAL_TABLET | Freq: Two times a day (BID) | ORAL | Status: DC

## 2015-02-03 MED ORDER — DILTIAZEM HCL ER COATED BEADS 120 MG PO CP24: 120.0000 mg | ORAL_CAPSULE | Freq: Every day | ORAL | Status: DC

## 2015-02-03 MED ORDER — METHYLPREDNISOLONE 4 MG PO KIT: PACK | ORAL | Status: AC

## 2015-02-03 NOTE — Progress Notes (Signed)
   Subjective:    Patient ID: Natalie Hall, female    DOB: 08-18-1951, 64 y.o.   MRN: [id number]  HPI 64 yr old female for a cpx. She has a few items to talk about. First she has had a difficult year with losing her father and her brother, and with other family issues. She feels depressed and anxious and it affects her sleep. Appetite is good. She asks if she can try Lexapro since a coworker told her how well this has worked for her. Also for the past week she has had sinus pressure, PND, and a dry cough.    Review of Systems  Constitutional: Negative.   HENT: Positive for congestion, postnasal drip and sinus pressure.   Eyes: Negative.   Respiratory: Positive for cough. Negative for chest tightness, shortness of breath and wheezing.   Cardiovascular: Negative.   Gastrointestinal: Negative.   Genitourinary: Negative for dysuria, urgency, frequency, hematuria, flank pain, decreased urine volume, enuresis, difficulty urinating, pelvic pain and dyspareunia.  Musculoskeletal: Negative.   Skin: Negative.   Neurological: Negative.   Psychiatric/Behavioral: Positive for dysphoric mood. Negative for suicidal ideas, hallucinations, behavioral problems, confusion, sleep disturbance, self-injury, decreased concentration and agitation. The patient is nervous/anxious. The patient is not hyperactive.        Objective:   Physical Exam  Constitutional: She is oriented to person, place, and time. She appears well-developed and well-nourished. No distress.  HENT:  Head: Normocephalic and atraumatic.  Right Ear: External ear normal.  Left Ear: External ear normal.  Nose: Nose normal.  Mouth/Throat: Oropharynx is clear and moist. No oropharyngeal exudate.  Eyes: Conjunctivae and EOM are normal. Pupils are equal, round, and reactive to light. No scleral icterus.  Neck: Normal range of motion. Neck supple. No JVD present. No thyromegaly present.  Cardiovascular: Normal rate, regular rhythm, normal heart  sounds and intact distal pulses.  Exam reveals no gallop and no friction rub.   No murmur heard. EKG normal   Pulmonary/Chest: Effort normal and breath sounds normal. No respiratory distress. She has no wheezes. She has no rales. She exhibits no tenderness.  Abdominal: Soft. Bowel sounds are normal. She exhibits no distension and no mass. There is no tenderness. There is no rebound and no guarding.  Musculoskeletal: Normal range of motion. She exhibits no edema or tenderness.  Lymphadenopathy:    She has no cervical adenopathy.  Neurological: She is alert and oriented to person, place, and time. She has normal reflexes. No cranial nerve deficit. She exhibits normal muscle tone. Coordination normal.  Skin: Skin is warm and dry. No rash noted. No erythema.  Psychiatric: Her behavior is normal. Judgment and thought content normal.  Depressed affect           Assessment & Plan:  Well exam. Try Lexapro 10 mg daily for the depression. Treat the sinusitis with Augmentin and a Medrol dose pack.

## 2015-02-03 NOTE — Progress Notes (Signed)
Pre visit review using our clinic review tool, if applicable. No additional management support is needed unless otherwise documented below in the visit note. 

## 2015-02-07 ENCOUNTER — Telehealth: Payer: Self-pay | Admitting: Family Medicine

## 2015-02-07 NOTE — Telephone Encounter (Signed)
Medical clarification request- pt has been prescribed Norvasc & Diltiazem, which is a possible duplicate of therapy? I only see Diltiazem in pt's current medication.

## 2015-02-08 NOTE — Telephone Encounter (Signed)
Pt's chart was updated

## 2015-02-08 NOTE — Telephone Encounter (Signed)
She should be taking ONLY Diltiazem

## 2015-02-11 ENCOUNTER — Ambulatory Visit (INDEPENDENT_AMBULATORY_CARE_PROVIDER_SITE_OTHER): Payer: 59 | Admitting: Family Medicine

## 2015-02-11 ENCOUNTER — Encounter: Payer: Self-pay | Admitting: Family Medicine

## 2015-02-11 VITALS — BP 142/79 | HR 73 | Temp 98.8°F | Ht 64.5 in | Wt 215.0 lb

## 2015-02-11 DIAGNOSIS — J0191 Acute recurrent sinusitis, unspecified: Secondary | ICD-10-CM | POA: Diagnosis not present

## 2015-02-11 DIAGNOSIS — T7840XA Allergy, unspecified, initial encounter: Secondary | ICD-10-CM

## 2015-02-11 MED ORDER — PREDNISONE 10 MG PO TABS: ORAL_TABLET | ORAL | Status: DC

## 2015-02-11 NOTE — Progress Notes (Signed)
   Subjective:    Patient ID: Natalie Hall, female    DOB: 12/27/1950, 64 y.o.   MRN: [id number]  HPI Here for an itchy rash on her trunk and both legs which started about 5 days ago. We saw her on 02-03-15 when she described some depression symptoms and a sinus infection. She was given Augmentin and Lexapro. She has taken Augmentin many times in the past and she has never had a problem with it. She had never tried Lexapro before. She felt "weird" with the Lexapro and she stopped taking this after 3 days. She finshed up the Augmentin and her sinuses feel fine now.    Review of Systems  Constitutional: Negative.   HENT: Negative.   Eyes: Negative.   Respiratory: Negative.   Cardiovascular: Negative.   Skin: Positive for rash.  Neurological: Negative.        Objective:   Physical Exam  Constitutional: She is oriented to person, place, and time. She appears well-developed and well-nourished. No distress.  HENT:  Right Ear: External ear normal.  Left Ear: External ear normal.  Nose: Nose normal.  Mouth/Throat: Oropharynx is clear and moist.  Eyes: Conjunctivae are normal.  Neck: Neck supple. No thyromegaly present.  Cardiovascular: Normal rate, regular rhythm, normal heart sounds and intact distal pulses.   Pulmonary/Chest: Effort normal and breath sounds normal.  Lymphadenopathy:    She has no cervical adenopathy.  Neurological: She is alert and oriented to person, place, and time. No cranial nerve deficit. Coordination normal.  Skin:  Diffuse red papular rash over the trunk and legs          Assessment & Plan:  The sinusitis has resolved. She has had an allergic reaction to the Lexapro, so she will avoid this in the future. She will use a Prednisone taper over 9 days starting with 30 mg a day. Add Benadryl prn

## 2015-02-11 NOTE — Progress Notes (Signed)
Pre visit review using our clinic review tool, if applicable. No additional management support is needed unless otherwise documented below in the visit note. 

## 2015-03-27 IMAGING — CR DG CHEST 2V
2 series · 2 of 2 positions shown · non-contrast
Comparison: None.

CLINICAL DATA: Preoperative for total knee replacement. History of
asthma.

EXAM:
CHEST  2 VIEW

[w chest pa]
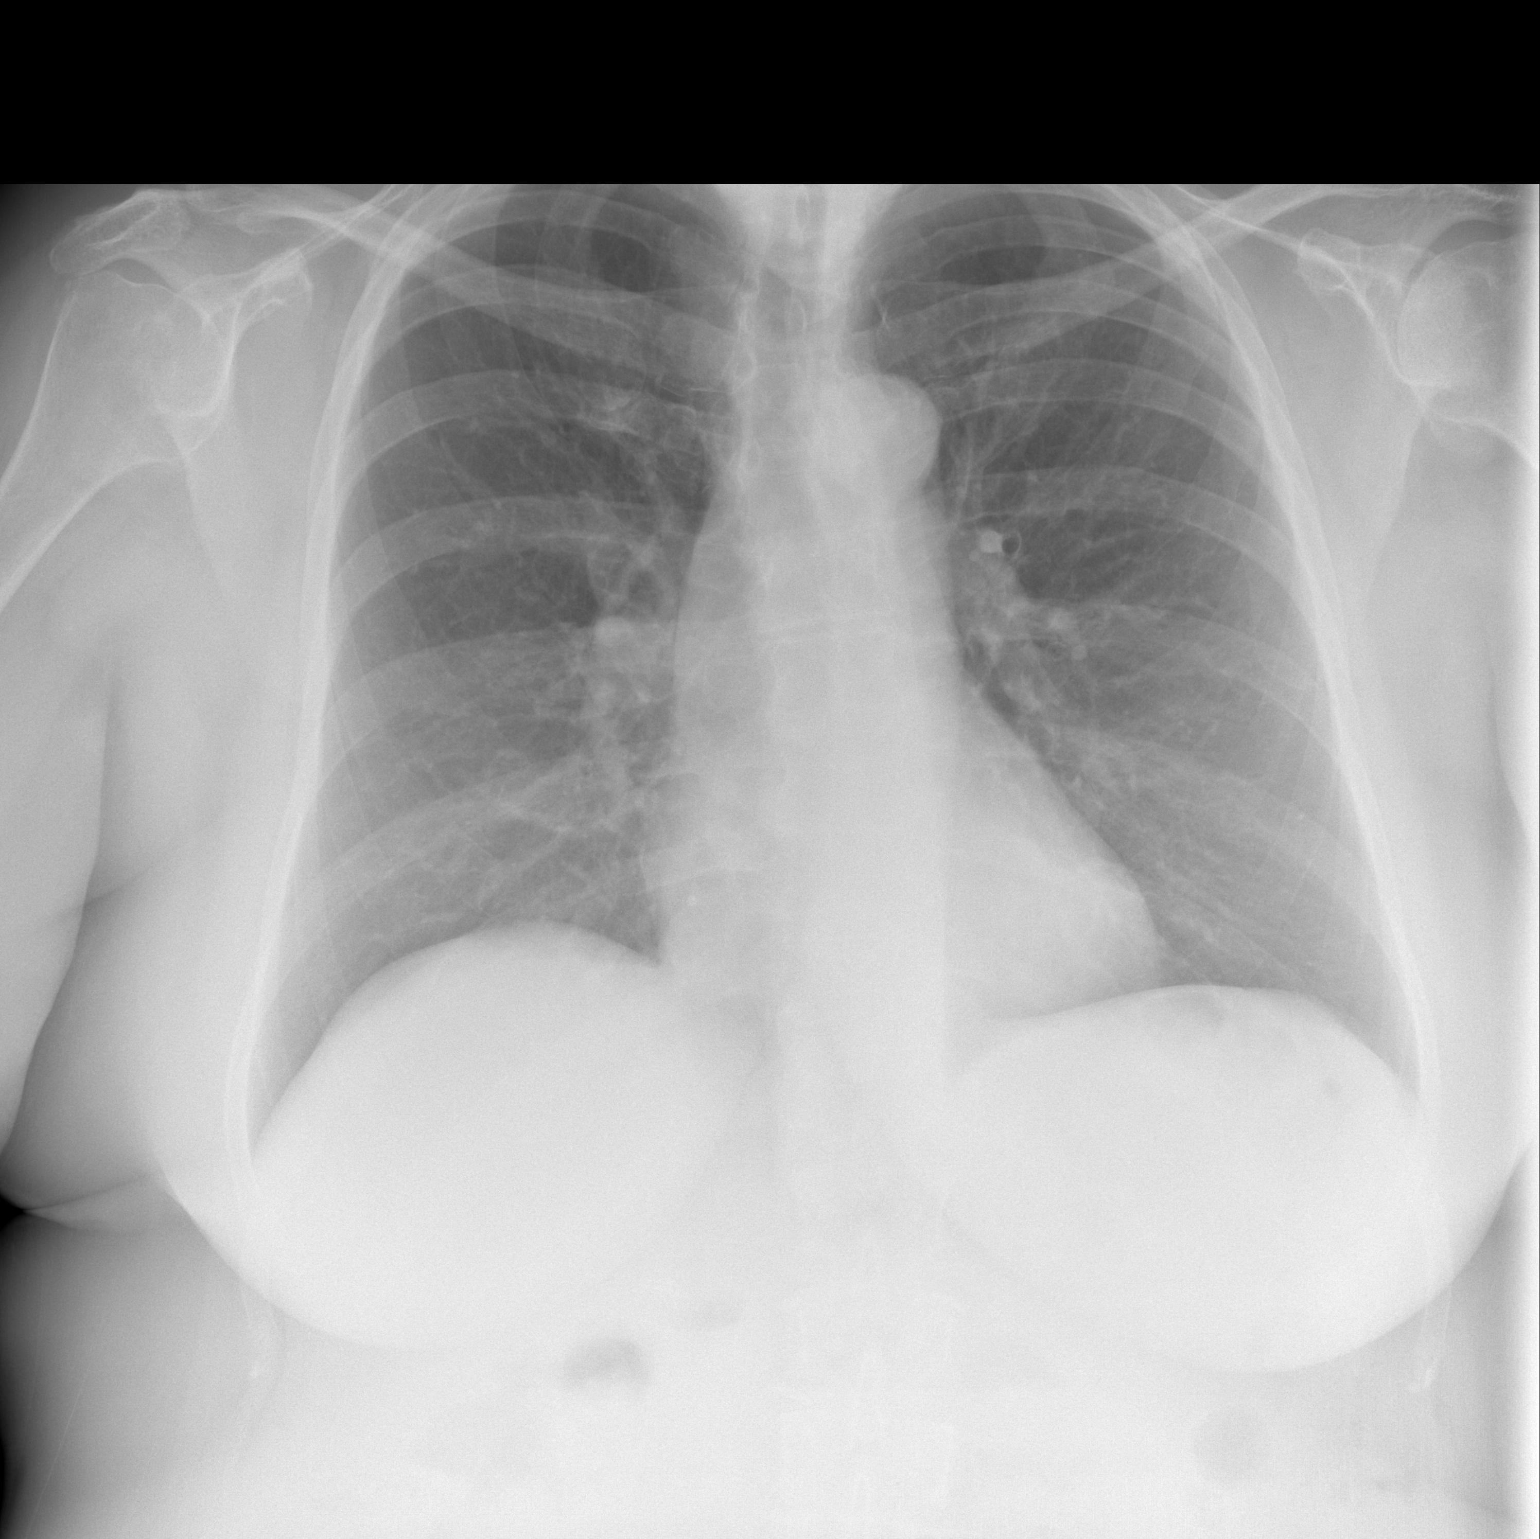

[w chest lat]
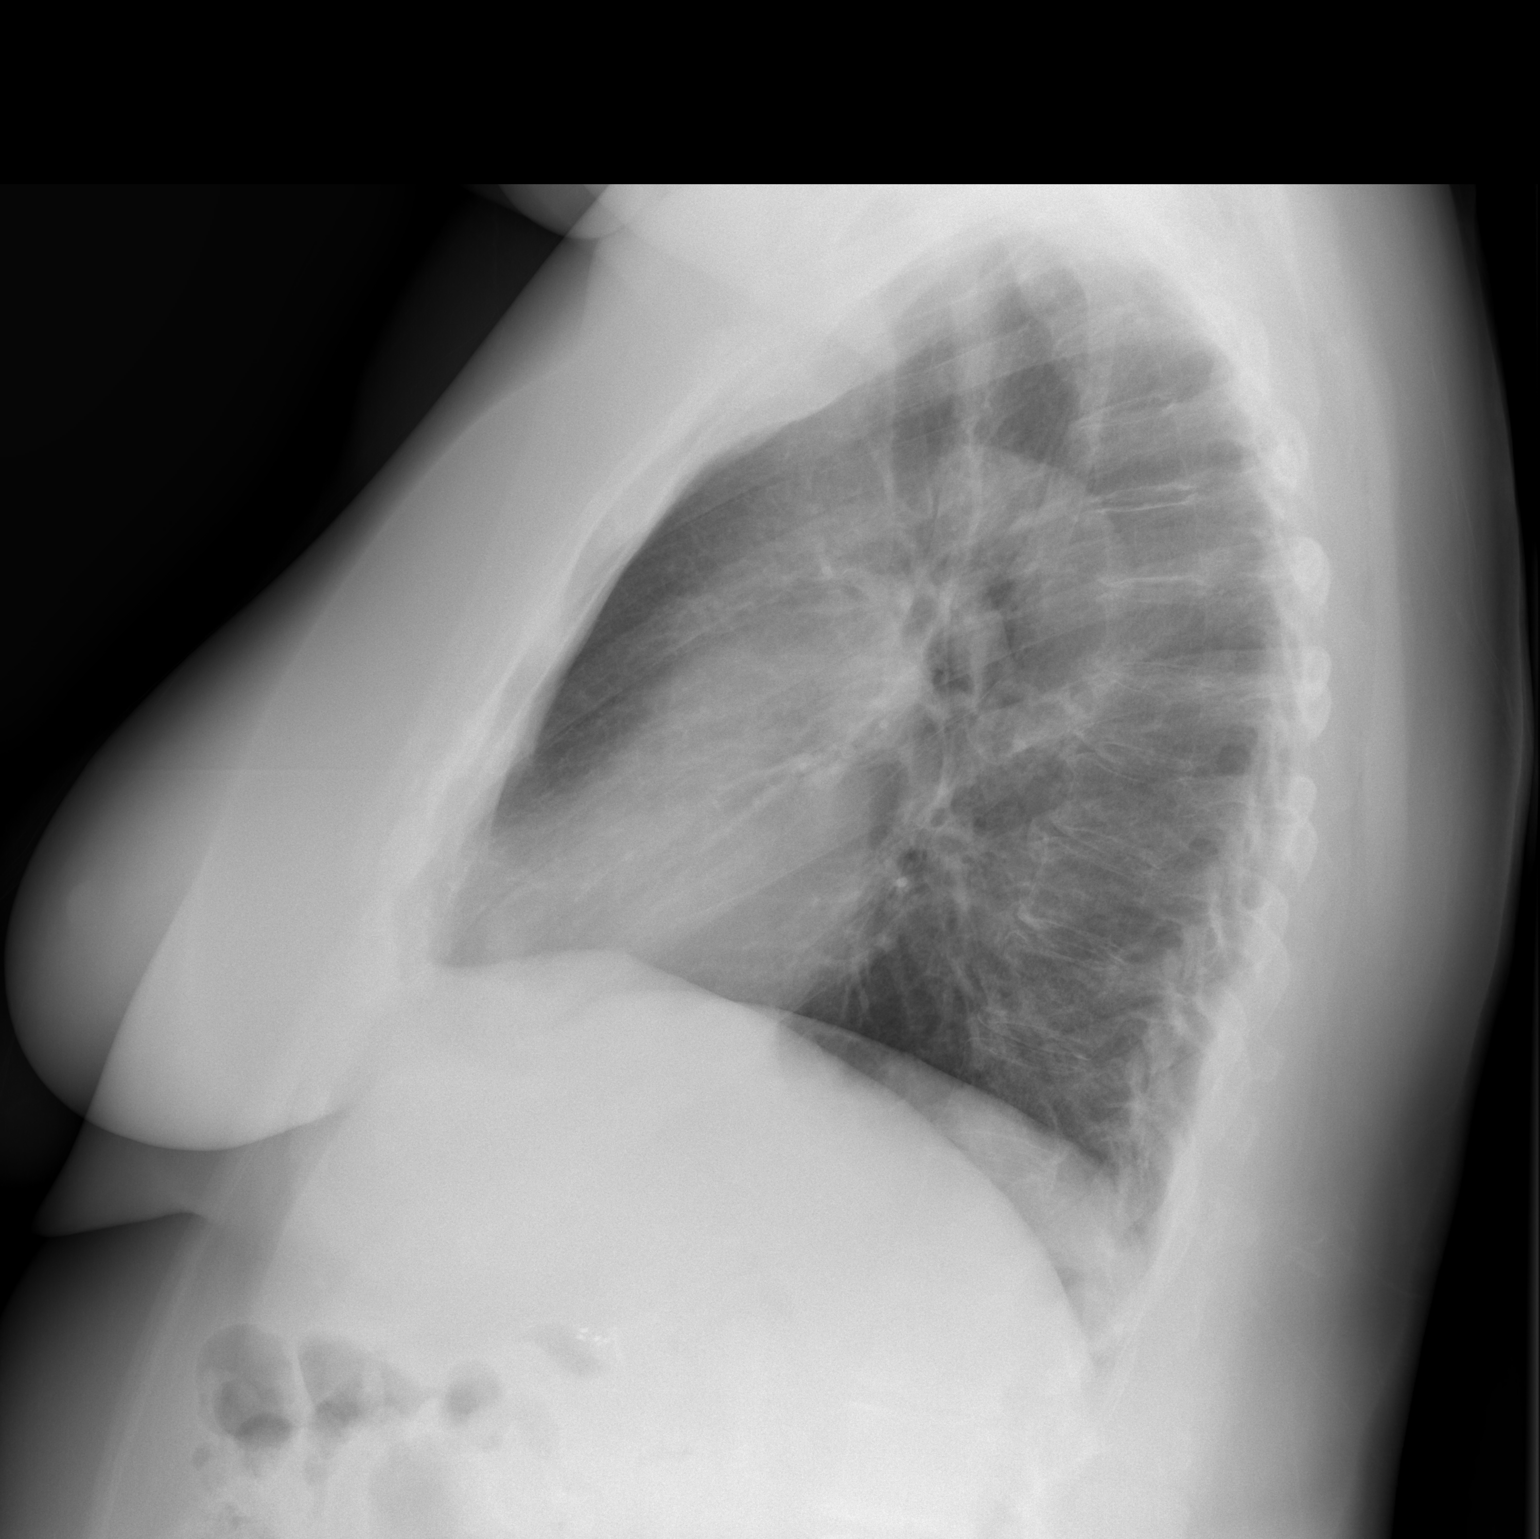

[2 of 2 positions shown; findings below may reference images not displayed]

FINDINGS: The heart size and mediastinal contours are within normal limits.
Both lungs are clear. There are degenerative joint changes of spine
with scoliosis of spine. The visualized skeletal structures are
otherwise unremarkable.
IMPRESSION: No active cardiopulmonary disease.

## 2015-04-06 ENCOUNTER — Ambulatory Visit (INDEPENDENT_AMBULATORY_CARE_PROVIDER_SITE_OTHER): Payer: 59 | Admitting: Family Medicine

## 2015-04-06 DIAGNOSIS — Z23 Encounter for immunization: Secondary | ICD-10-CM

## 2015-07-01 ENCOUNTER — Encounter: Payer: Self-pay | Admitting: Family Medicine

## 2015-07-01 ENCOUNTER — Ambulatory Visit (INDEPENDENT_AMBULATORY_CARE_PROVIDER_SITE_OTHER): Payer: Medicare Other | Admitting: Family Medicine

## 2015-07-01 VITALS — BP 163/78 | HR 88 | Temp 98.7°F

## 2015-07-01 DIAGNOSIS — J019 Acute sinusitis, unspecified: Secondary | ICD-10-CM

## 2015-07-01 MED ORDER — METHYLPREDNISOLONE 4 MG PO TBPK: ORAL_TABLET | ORAL | Status: DC

## 2015-07-01 MED ORDER — AMOXICILLIN-POT CLAVULANATE 875-125 MG PO TABS: 1.0000 | ORAL_TABLET | Freq: Two times a day (BID) | ORAL | Status: DC

## 2015-07-01 NOTE — Progress Notes (Signed)
Pre visit review using our clinic review tool, if applicable. No additional management support is needed unless otherwise documented below in the visit note. Pt declined to weigh 

## 2015-07-01 NOTE — Progress Notes (Signed)
   Subjective:    Patient ID: Natalie Hall, female    DOB: [birth year], 64 y.o.   MRN: [id number]  HPI Here for one week of sinus pressure, PND, and ST. No fever or cough. On Advil.    Review of Systems  Constitutional: Negative.   HENT: Positive for congestion, postnasal drip and sinus pressure.   Eyes: Negative.   Respiratory: Negative.        Objective:   Physical Exam  Constitutional: She appears well-developed and well-nourished.  HENT:  Right Ear: External ear normal.  Left Ear: External ear normal.  Nose: Nose normal.  Mouth/Throat: Oropharynx is clear and moist.  Eyes: Conjunctivae are normal.  Pulmonary/Chest: Effort normal and breath sounds normal.  Lymphadenopathy:    She has no cervical adenopathy.          Assessment & Plan:  Sinusitis , treat with Augmentin and a Medrol dose pack

## 2015-07-04 ENCOUNTER — Telehealth: Payer: Self-pay | Admitting: Family Medicine

## 2015-07-04 MED ORDER — METHYLPREDNISOLONE 4 MG PO TBPK: ORAL_TABLET | ORAL | Status: DC

## 2015-07-04 MED ORDER — LEVOFLOXACIN 500 MG PO TABS: 500.0000 mg | ORAL_TABLET | Freq: Every day | ORAL | Status: DC

## 2015-07-04 NOTE — Telephone Encounter (Signed)
Pt saw dr fry on Friday for sinus inf. Pt states the amoxicillin-clavulanate (AUGMENTIN) 875-125 MG per tablet has broken her out in rash, itching on legs mostly. Pt would like another rx  Called in to  [facility].  Pt states this happened last April w/ same symptoms when she was put on augmentin. Dr fry also gave her prednisone at that time for the rash.

## 2015-07-04 NOTE — Telephone Encounter (Signed)
Stop the Augmentin. Call in Levaquin 500 mg daily for 10 days. Also call in a Medrol dose pack

## 2015-07-04 NOTE — Telephone Encounter (Signed)
I spoke with husband, sent both scripts e-scribe and Augmentin was added to pt's drug allergy list.

## 2015-07-13 ENCOUNTER — Ambulatory Visit (INDEPENDENT_AMBULATORY_CARE_PROVIDER_SITE_OTHER): Payer: Medicare Other | Admitting: Family Medicine

## 2015-07-13 ENCOUNTER — Encounter: Payer: Self-pay | Admitting: Family Medicine

## 2015-07-13 VITALS — BP 101/80 | HR 92 | Temp 98.8°F | Wt 216.0 lb

## 2015-07-13 DIAGNOSIS — L270 Generalized skin eruption due to drugs and medicaments taken internally: Secondary | ICD-10-CM | POA: Diagnosis not present

## 2015-07-13 DIAGNOSIS — T3695XA Adverse effect of unspecified systemic antibiotic, initial encounter: Principal | ICD-10-CM

## 2015-07-13 MED ORDER — PREDNISONE 10 MG PO TABS: ORAL_TABLET | ORAL | Status: DC

## 2015-07-13 NOTE — Progress Notes (Signed)
Pre visit review using our clinic review tool, if applicable. No additional management support is needed unless otherwise documented below in the visit note. 

## 2015-07-13 NOTE — Progress Notes (Signed)
   Subjective:    Patient ID: Natalie Hall, female    DOB: [birth year], 64 y.o.   MRN: [id number]  HPI Here for an itchy rash on both lower legs that we think is an allergic reaction to Augmentin. She was seen here on 07-01-15 for a sinusitis and was given Augmentin. Within a few days this rash appeared so she was told to stop the Augmentin. She was started on a Medrol dose pack , and the rash has improved a little. She still as a lot of itching however. She is applying mosturizers OTC.    Review of Systems  Constitutional: Negative.   HENT: Negative.   Eyes: Negative.   Respiratory: Negative.   Cardiovascular: Negative.   Skin: Positive for rash.       Objective:   Physical Exam  Constitutional: She appears well-developed and well-nourished.  Skin:  Scattered red macular and papular areas on both lower legs          Assessment & Plan:  This does seem to be an allergic reaction, so she will start another prednisone taper but at a higher dose. We will start at 60 mg a day and taper down over 18 days. She can also apply topical Benadryl cream.

## 2015-07-22 ENCOUNTER — Telehealth: Payer: Self-pay | Admitting: Family Medicine

## 2015-07-22 NOTE — Telephone Encounter (Signed)
For the best results, I would wait to get the flu shot until about 2 weeks after the prednisone is finished

## 2015-07-22 NOTE — Telephone Encounter (Signed)
Pt is taking prednisone and is scheduled for a flu shot on 07/28/15 and is asking if it will be ok to take the flu shot

## 2015-07-22 NOTE — Telephone Encounter (Signed)
Spoke with and appt has been cancel pt will call back and reschedule

## 2015-07-28 ENCOUNTER — Other Ambulatory Visit: Payer: Medicare Other

## 2015-08-15 ENCOUNTER — Ambulatory Visit (INDEPENDENT_AMBULATORY_CARE_PROVIDER_SITE_OTHER): Payer: Medicare Other | Admitting: Family Medicine

## 2015-08-15 DIAGNOSIS — Z23 Encounter for immunization: Secondary | ICD-10-CM

## 2015-10-06 DIAGNOSIS — Z961 Presence of intraocular lens: Secondary | ICD-10-CM | POA: Insufficient documentation

## 2015-10-06 DIAGNOSIS — H3561 Retinal hemorrhage, right eye: Secondary | ICD-10-CM | POA: Insufficient documentation

## 2015-10-09 ENCOUNTER — Other Ambulatory Visit: Payer: Self-pay | Admitting: Family Medicine

## 2015-10-10 NOTE — Telephone Encounter (Signed)
Rx was sent  

## 2015-10-10 NOTE — Telephone Encounter (Signed)
Pt last visit 07/13/15 Pt last Rx 07/13/15 #70 no refill

## 2015-10-10 NOTE — Telephone Encounter (Signed)
Call in  Prednisone taper like last time

## 2015-10-23 DIAGNOSIS — I2510 Atherosclerotic heart disease of native coronary artery without angina pectoris: Secondary | ICD-10-CM

## 2015-10-23 DIAGNOSIS — H3589 Other specified retinal disorders: Secondary | ICD-10-CM

## 2015-10-23 DIAGNOSIS — C6992 Malignant neoplasm of unspecified site of left eye: Secondary | ICD-10-CM

## 2015-10-23 HISTORY — DX: Malignant neoplasm of unspecified site of left eye: C69.92

## 2015-10-23 HISTORY — DX: Other specified retinal disorders: H35.89

## 2015-10-23 HISTORY — DX: Atherosclerotic heart disease of native coronary artery without angina pectoris: I25.10

## 2015-11-01 ENCOUNTER — Other Ambulatory Visit: Payer: Self-pay | Admitting: Family Medicine

## 2015-11-29 ENCOUNTER — Ambulatory Visit (INDEPENDENT_AMBULATORY_CARE_PROVIDER_SITE_OTHER): Payer: Medicare Other | Admitting: Family Medicine

## 2015-11-29 ENCOUNTER — Encounter: Payer: Self-pay | Admitting: Family Medicine

## 2015-11-29 VITALS — BP 150/80 | HR 98 | Temp 98.5°F | Ht 64.5 in | Wt 219.0 lb

## 2015-11-29 DIAGNOSIS — J019 Acute sinusitis, unspecified: Secondary | ICD-10-CM | POA: Diagnosis not present

## 2015-11-29 MED ORDER — LEVOFLOXACIN 500 MG PO TABS
500.0000 mg | ORAL_TABLET | Freq: Every day | ORAL | Status: AC
Start: 2015-11-29 — End: 2015-12-09

## 2015-11-29 NOTE — Progress Notes (Signed)
Pre visit review using our clinic review tool, if applicable. No additional management support is needed unless otherwise documented below in the visit note. 

## 2015-11-29 NOTE — Progress Notes (Signed)
   Subjective:    Patient ID: Natalie Hall, female    DOB: 05-Feb-1951, 65 y.o.   MRN: [id number]  HPI Here for one week of sinus pressure, PA, right ear pain and PND. No cough or fever. On Loratidine.   Review of Systems  Constitutional: Negative.   HENT: Positive for congestion, ear pain, postnasal drip, sinus pressure and sore throat.   Eyes: Negative.   Respiratory: Negative.        Objective:   Physical Exam  Constitutional: She appears well-developed and well-nourished.  HENT:  Right Ear: External ear normal.  Left Ear: External ear normal.  Nose: Nose normal.  Mouth/Throat: Oropharynx is clear and moist.  Eyes: Conjunctivae are normal.  Neck: No thyromegaly present.  Pulmonary/Chest: Effort normal and breath sounds normal.  Lymphadenopathy:    She has no cervical adenopathy.          Assessment & Plan:  Sinusitis, treat with Levaquin.

## 2015-12-14 ENCOUNTER — Encounter: Payer: Self-pay | Admitting: Family Medicine

## 2015-12-14 ENCOUNTER — Ambulatory Visit (HOSPITAL_COMMUNITY)
Admission: RE | Admit: 2015-12-14 | Discharge: 2015-12-14 | Disposition: A | Discharge status: Home / Self Care | Payer: Medicare Other | Source: Ambulatory Visit | Attending: Cardiovascular Disease | Admitting: Cardiovascular Disease

## 2015-12-14 ENCOUNTER — Ambulatory Visit (INDEPENDENT_AMBULATORY_CARE_PROVIDER_SITE_OTHER): Payer: Medicare Other | Admitting: Family Medicine

## 2015-12-14 VITALS — BP 146/80 | HR 86 | Temp 98.9°F

## 2015-12-14 DIAGNOSIS — E785 Hyperlipidemia, unspecified: Secondary | ICD-10-CM | POA: Diagnosis not present

## 2015-12-14 DIAGNOSIS — M25561 Pain in right knee: Secondary | ICD-10-CM

## 2015-12-14 DIAGNOSIS — I10 Essential (primary) hypertension: Secondary | ICD-10-CM | POA: Diagnosis not present

## 2015-12-14 DIAGNOSIS — M79604 Pain in right leg: Secondary | ICD-10-CM | POA: Diagnosis not present

## 2015-12-14 DIAGNOSIS — M7989 Other specified soft tissue disorders: Secondary | ICD-10-CM | POA: Diagnosis present

## 2015-12-14 NOTE — Progress Notes (Signed)
   Subjective:    Patient ID: Natalie Hall, female    DOB: 1950-12-26, 65 y.o.   MRN: [id number]  HPI Here for the onset 2 days ago of swelling and pain in the right leg. No SOB or chest pain. She has a hx of varicose veins in both legs and she has had ablations at the [facility]. Of note she just finished a course of Levaquin, and she wonders if this could be a side effect.   Review of Systems  Constitutional: Negative.   Respiratory: Negative.   Cardiovascular: Positive for leg swelling.       Objective:   Physical Exam  Constitutional: She appears well-developed and well-nourished. No distress.  Neck: No thyromegaly present.  Cardiovascular: Normal rate, regular rhythm, normal heart sounds and intact distal pulses.   Pulmonary/Chest: Effort normal and breath sounds normal.  Musculoskeletal:  The left leg has 1+ edema. The right leg has 2+ edema and she is tender in the calf. No cords are felt and Bevelyn Buckles is negative.   Lymphadenopathy:    She has no cervical adenopathy.          Assessment & Plan:  This is most consistent with phlebitis, but we need to rule out a DVT. She will be sent for a venous doppler today. I doubt this is connected to her recent use of Levaquin, but we will avoid using this in the future. In the meantime she is taking 2 Aleve tablets BID for discomfort.

## 2015-12-14 NOTE — Progress Notes (Signed)
Pre visit review using our clinic review tool, if applicable. No additional management support is needed unless otherwise documented below in the visit note. Pt declined to weigh 

## 2015-12-16 ENCOUNTER — Other Ambulatory Visit: Payer: Self-pay

## 2015-12-16 ENCOUNTER — Telehealth: Payer: Self-pay | Admitting: Family Medicine

## 2015-12-16 MED ORDER — TRAMADOL HCL 50 MG PO TABS
ORAL_TABLET | ORAL | Status: DC
Start: 2015-12-16 — End: 2016-02-13

## 2015-12-16 NOTE — Telephone Encounter (Signed)
Pt was seen on 12-14-15 and had doppler done that was negative per pt. Pt would like to know the next step. Pt was not giving any pain med

## 2015-12-16 NOTE — Telephone Encounter (Signed)
done

## 2015-12-19 MED ORDER — CYCLOBENZAPRINE HCL 10 MG PO TABS: 10.0000 mg | ORAL_TABLET | Freq: Three times a day (TID) | ORAL | Status: DC | PRN

## 2015-12-19 NOTE — Addendum Note (Signed)
Addended by: Alysia Penna A on: 12/19/2015 11:35 AM   Modules accepted: Orders

## 2015-12-20 LAB — HM MAMMOGRAPHY

## 2015-12-21 ENCOUNTER — Other Ambulatory Visit: Payer: Self-pay | Admitting: Orthopedic Surgery

## 2015-12-21 DIAGNOSIS — M19012 Primary osteoarthritis, left shoulder: Secondary | ICD-10-CM

## 2015-12-23 ENCOUNTER — Encounter: Payer: Self-pay | Admitting: Family Medicine

## 2016-01-12 ENCOUNTER — Telehealth: Payer: Self-pay | Admitting: *Deleted

## 2016-01-12 NOTE — Telephone Encounter (Signed)
I informed the pt Dr Sarajane Jews received a letter from Dr [name]' office requesting clearance for surgery and per Dr Sarajane Jews she needs an office visit (30 min) for this.  She stated she has a physical next month and wanted to know if she could do this at that visit and I advised her usually a separate visit is needed for this but I will check with Dr Sarajane Jews and call her back tomorrow.  She stated it is Ok to leave a detailed message at her cell number.

## 2016-01-13 NOTE — Telephone Encounter (Signed)
Actually I can do the surgical clearance and the physical at the same time. No problem thanks

## 2016-01-13 NOTE — Telephone Encounter (Signed)
I left a detailed message with the information below at the pts cell number. 

## 2016-01-15 ENCOUNTER — Other Ambulatory Visit: Payer: Self-pay | Admitting: Family Medicine

## 2016-02-07 ENCOUNTER — Other Ambulatory Visit (INDEPENDENT_AMBULATORY_CARE_PROVIDER_SITE_OTHER): Payer: Medicare Other

## 2016-02-07 DIAGNOSIS — Z0000 Encounter for general adult medical examination without abnormal findings: Secondary | ICD-10-CM | POA: Diagnosis not present

## 2016-02-07 LAB — POC URINALSYSI DIPSTICK (AUTOMATED)
Bilirubin, UA: NEGATIVE
Glucose, UA: NEGATIVE
Ketones, UA: NEGATIVE
Leukocytes, UA: NEGATIVE
NITRITE UA: NEGATIVE
PROTEIN UA: NEGATIVE
SPEC GRAV UA: 1.01
UROBILINOGEN UA: 0.2
pH, UA: 7

## 2016-02-07 LAB — LIPID PANEL
CHOL/HDL RATIO: 5
Cholesterol: 169 mg/dL (ref 0–200)
HDL: 32.5 mg/dL — ABNORMAL LOW (ref >39.00)
LDL Cholesterol: 99 mg/dL (ref 0–99)
NonHDL: 136.21
TRIGLYCERIDES: 186 mg/dL — AB (ref 0.0–149.0)
VLDL: 37.2 mg/dL (ref 0.0–40.0)

## 2016-02-07 LAB — CBC WITH DIFFERENTIAL/PLATELET
BASOS PCT: 0.2 % (ref 0.0–3.0)
Basophils Absolute: 0 10*3/uL (ref 0.0–0.1)
EOS ABS: 0.2 10*3/uL (ref 0.0–0.7)
Eosinophils Relative: 3.6 % (ref 0.0–5.0)
HEMATOCRIT: 39.1 % (ref 36.0–46.0)
HEMOGLOBIN: 13 g/dL (ref 12.0–15.0)
LYMPHS PCT: 24.4 % (ref 12.0–46.0)
Lymphs Abs: 1.4 10*3/uL (ref 0.7–4.0)
MCHC: 33.2 g/dL (ref 30.0–36.0)
MCV: 81.2 fl (ref 78.0–100.0)
MONOS PCT: 7.8 % (ref 3.0–12.0)
Monocytes Absolute: 0.4 10*3/uL (ref 0.1–1.0)
NEUTROS ABS: 3.7 10*3/uL (ref 1.4–7.7)
Neutrophils Relative %: 64 % (ref 43.0–77.0)
Platelets: 238 10*3/uL (ref 150.0–400.0)
RBC: 4.82 Mil/uL (ref 3.87–5.11)
RDW: 15.2 % (ref 11.5–15.5)
WBC: 5.7 10*3/uL (ref 4.0–10.5)

## 2016-02-07 LAB — HEPATIC FUNCTION PANEL
ALBUMIN: 4.2 g/dL (ref 3.5–5.2)
ALT: 21 U/L (ref 0–35)
AST: 21 U/L (ref 0–37)
Alkaline Phosphatase: 47 U/L (ref 39–117)
BILIRUBIN DIRECT: 0 mg/dL (ref 0.0–0.3)
TOTAL PROTEIN: 7.4 g/dL (ref 6.0–8.3)
Total Bilirubin: 0.4 mg/dL (ref 0.2–1.2)

## 2016-02-07 LAB — BASIC METABOLIC PANEL
BUN: 18 mg/dL (ref 6–23)
CHLORIDE: 100 meq/L (ref 96–112)
CO2: 31 meq/L (ref 19–32)
Calcium: 9.6 mg/dL (ref 8.4–10.5)
Creatinine, Ser: 0.59 mg/dL (ref 0.40–1.20)
GFR: 108.47 mL/min (ref >60.00)
Glucose, Bld: 96 mg/dL (ref 70–99)
POTASSIUM: 3.9 meq/L (ref 3.5–5.1)
SODIUM: 138 meq/L (ref 135–145)

## 2016-02-07 LAB — TSH: TSH: 1.4 u[IU]/mL (ref 0.35–4.50)

## 2016-02-13 ENCOUNTER — Encounter: Payer: Self-pay | Admitting: Family Medicine

## 2016-02-13 ENCOUNTER — Ambulatory Visit (INDEPENDENT_AMBULATORY_CARE_PROVIDER_SITE_OTHER): Payer: Medicare Other | Admitting: Family Medicine

## 2016-02-13 VITALS — BP 138/78 | HR 90 | Temp 98.5°F | Ht 64.5 in | Wt 217.0 lb

## 2016-02-13 DIAGNOSIS — Z01818 Encounter for other preprocedural examination: Secondary | ICD-10-CM | POA: Diagnosis not present

## 2016-02-13 DIAGNOSIS — Z0000 Encounter for general adult medical examination without abnormal findings: Secondary | ICD-10-CM

## 2016-02-13 DIAGNOSIS — Z209 Contact with and (suspected) exposure to unspecified communicable disease: Secondary | ICD-10-CM | POA: Diagnosis not present

## 2016-02-13 LAB — HEPATITIS C ANTIBODY: HCV Ab: NEGATIVE

## 2016-02-13 NOTE — Progress Notes (Signed)
   Subjective:    Patient ID: Natalie Hall, female    DOB: [birth year], 65 y.o.   MRN: [id number]  HPI 65 yr old female for a well exam and also for presurgical clearance. She will be having a reverse TSA procedure on the left shoulder per Dr. Esmond Plants in the next few months. Other than the shoulder pain she feels well. She sees Dr. Shon Hough in [place] and Dr. Gerarda Fraction at [facility] for a choroidal nevus of the left eye. She was seen in December and again in February, and the lesion had not changed appreciably. She will be seen again in June.    Review of Systems  Constitutional: Negative.   HENT: Negative.   Eyes: Negative.   Respiratory: Negative.   Cardiovascular: Negative.   Gastrointestinal: Negative.   Genitourinary: Negative for dysuria, urgency, frequency, hematuria, flank pain, decreased urine volume, enuresis, difficulty urinating, pelvic pain and dyspareunia.  Musculoskeletal: Negative.   Skin: Negative.   Neurological: Negative.   Psychiatric/Behavioral: Negative.        Objective:   Physical Exam  Constitutional: She is oriented to person, place, and time. She appears well-developed and well-nourished. No distress.  HENT:  Head: Normocephalic and atraumatic.  Right Ear: External ear normal.  Left Ear: External ear normal.  Nose: Nose normal.  Mouth/Throat: Oropharynx is clear and moist. No oropharyngeal exudate.  Eyes: Conjunctivae and EOM are normal. Pupils are equal, round, and reactive to light. No scleral icterus.  Neck: Normal range of motion. Neck supple. No JVD present. No thyromegaly present.  Cardiovascular: Normal rate, regular rhythm, normal heart sounds and intact distal pulses.  Exam reveals no gallop and no friction rub.   No murmur heard. EKG normal   Pulmonary/Chest: Effort normal and breath sounds normal. No respiratory distress. She has no wheezes. She has no rales. She exhibits no tenderness.  Abdominal: Soft.  Bowel sounds are normal. She exhibits no distension and no mass. There is no tenderness. There is no rebound and no guarding.  Musculoskeletal: Normal range of motion. She exhibits no edema or tenderness.  Lymphadenopathy:    She has no cervical adenopathy.  Neurological: She is alert and oriented to person, place, and time. She has normal reflexes. No cranial nerve deficit. She exhibits normal muscle tone. Coordination normal.  Skin: Skin is warm and dry. No rash noted. No erythema.  Psychiatric: She has a normal mood and affect. Her behavior is normal. Judgment and thought content normal.          Assessment & Plan:  Well exam. We discussed diet and exercise. She seems to be doing well. She is cleared for the shoulder surgery.  Laurey Morale, MD

## 2016-02-13 NOTE — Progress Notes (Signed)
Pre visit review using our clinic review tool, if applicable. No additional management support is needed unless otherwise documented below in the visit note. 

## 2016-03-11 ENCOUNTER — Encounter (HOSPITAL_BASED_OUTPATIENT_CLINIC_OR_DEPARTMENT_OTHER): Payer: Self-pay

## 2016-03-11 ENCOUNTER — Inpatient Hospital Stay (HOSPITAL_BASED_OUTPATIENT_CLINIC_OR_DEPARTMENT_OTHER)
Admission: EM | Admit: 2016-03-11 | Discharge: 2016-03-15 | DRG: 247 | Disposition: A | Discharge status: Home / Self Care | Payer: Medicare Other | Attending: Cardiovascular Disease | Admitting: Cardiovascular Disease

## 2016-03-11 DIAGNOSIS — I2510 Atherosclerotic heart disease of native coronary artery without angina pectoris: Secondary | ICD-10-CM | POA: Diagnosis present

## 2016-03-11 DIAGNOSIS — I471 Supraventricular tachycardia: Secondary | ICD-10-CM

## 2016-03-11 DIAGNOSIS — I214 Non-ST elevation (NSTEMI) myocardial infarction: Principal | ICD-10-CM | POA: Diagnosis present

## 2016-03-11 DIAGNOSIS — I2584 Coronary atherosclerosis due to calcified coronary lesion: Secondary | ICD-10-CM | POA: Diagnosis present

## 2016-03-11 DIAGNOSIS — Z96652 Presence of left artificial knee joint: Secondary | ICD-10-CM | POA: Diagnosis present

## 2016-03-11 DIAGNOSIS — Z888 Allergy status to other drugs, medicaments and biological substances status: Secondary | ICD-10-CM

## 2016-03-11 DIAGNOSIS — I248 Other forms of acute ischemic heart disease: Secondary | ICD-10-CM | POA: Diagnosis not present

## 2016-03-11 DIAGNOSIS — R7989 Other specified abnormal findings of blood chemistry: Secondary | ICD-10-CM

## 2016-03-11 DIAGNOSIS — I252 Old myocardial infarction: Secondary | ICD-10-CM

## 2016-03-11 DIAGNOSIS — K219 Gastro-esophageal reflux disease without esophagitis: Secondary | ICD-10-CM | POA: Diagnosis present

## 2016-03-11 DIAGNOSIS — E785 Hyperlipidemia, unspecified: Secondary | ICD-10-CM | POA: Diagnosis present

## 2016-03-11 DIAGNOSIS — I10 Essential (primary) hypertension: Secondary | ICD-10-CM | POA: Diagnosis present

## 2016-03-11 DIAGNOSIS — R778 Other specified abnormalities of plasma proteins: Secondary | ICD-10-CM

## 2016-03-11 DIAGNOSIS — Z955 Presence of coronary angioplasty implant and graft: Secondary | ICD-10-CM

## 2016-03-11 DIAGNOSIS — Z8249 Family history of ischemic heart disease and other diseases of the circulatory system: Secondary | ICD-10-CM

## 2016-03-11 DIAGNOSIS — Z881 Allergy status to other antibiotic agents status: Secondary | ICD-10-CM

## 2016-03-11 DIAGNOSIS — R002 Palpitations: Secondary | ICD-10-CM | POA: Diagnosis not present

## 2016-03-11 HISTORY — DX: Supraventricular tachycardia, unspecified: I47.10

## 2016-03-11 HISTORY — DX: Supraventricular tachycardia: I47.1

## 2016-03-11 HISTORY — DX: Old myocardial infarction: I25.2

## 2016-03-11 LAB — CBC WITH DIFFERENTIAL/PLATELET
BASOS ABS: 0 10*3/uL (ref 0.0–0.1)
BASOS PCT: 0 %
EOS ABS: 0.2 10*3/uL (ref 0.0–0.7)
EOS PCT: 3 %
HCT: 40 % (ref 36.0–46.0)
Hemoglobin: 13.4 g/dL (ref 12.0–15.0)
Lymphocytes Relative: 45 %
Lymphs Abs: 2.6 10*3/uL (ref 0.7–4.0)
MCH: 26.6 pg (ref 26.0–34.0)
MCHC: 33.5 g/dL (ref 30.0–36.0)
MCV: 79.5 fL (ref 78.0–100.0)
Monocytes Absolute: 0.6 10*3/uL (ref 0.1–1.0)
Monocytes Relative: 11 %
Neutro Abs: 2.3 10*3/uL (ref 1.7–7.7)
Neutrophils Relative %: 41 %
PLATELETS: 237 10*3/uL (ref 150–400)
RBC: 5.03 MIL/uL (ref 3.87–5.11)
RDW: 15.1 % (ref 11.5–15.5)
WBC: 5.6 10*3/uL (ref 4.0–10.5)

## 2016-03-11 LAB — COMPREHENSIVE METABOLIC PANEL
ALK PHOS: 60 U/L (ref 38–126)
ALT: 31 U/L (ref 14–54)
ANION GAP: 9 (ref 5–15)
AST: 35 U/L (ref 15–41)
Albumin: 3.7 g/dL (ref 3.5–5.0)
BUN: 18 mg/dL (ref 6–20)
CALCIUM: 8.9 mg/dL (ref 8.9–10.3)
CO2: 22 mmol/L (ref 22–32)
CREATININE: 0.61 mg/dL (ref 0.44–1.00)
Chloride: 103 mmol/L (ref 101–111)
Glucose, Bld: 128 mg/dL — ABNORMAL HIGH (ref 65–99)
Potassium: 3.9 mmol/L (ref 3.5–5.1)
Sodium: 134 mmol/L — ABNORMAL LOW (ref 135–145)
Total Bilirubin: 0.5 mg/dL (ref 0.3–1.2)
Total Protein: 7.2 g/dL (ref 6.5–8.1)

## 2016-03-11 LAB — URINALYSIS, ROUTINE W REFLEX MICROSCOPIC
BILIRUBIN URINE: NEGATIVE
Glucose, UA: NEGATIVE mg/dL
KETONES UR: NEGATIVE mg/dL
Leukocytes, UA: NEGATIVE
NITRITE: NEGATIVE
PH: 6.5 (ref 5.0–8.0)
Protein, ur: NEGATIVE mg/dL
Specific Gravity, Urine: 1.006 (ref 1.005–1.030)

## 2016-03-11 LAB — URINE MICROSCOPIC-ADD ON

## 2016-03-11 LAB — MAGNESIUM: MAGNESIUM: 1.9 mg/dL (ref 1.7–2.4)

## 2016-03-11 LAB — PROTIME-INR
INR: 0.92 (ref 0.00–1.49)
PROTHROMBIN TIME: 12.6 s (ref 11.6–15.2)

## 2016-03-11 LAB — TROPONIN I: TROPONIN I: 0.87 ng/mL — AB (ref <0.031)

## 2016-03-11 MED ORDER — ASPIRIN 81 MG PO CHEW
324.0000 mg | CHEWABLE_TABLET | Freq: Once | ORAL | Status: AC
  Administered 2016-03-11: 324 mg via ORAL
  Filled 2016-03-11: qty 4

## 2016-03-11 MED ORDER — SODIUM CHLORIDE 0.9 % IV SOLN
INTRAVENOUS | Status: DC
  Administered 2016-03-12: 100 mL/h via INTRAVENOUS

## 2016-03-11 NOTE — ED Provider Notes (Signed)
CSN: [id number]     Arrival date & time 03/11/16  1945 History   First MD Initiated Contact with Patient 03/11/16 1958     Chief Complaint  Patient presents with  . Palpitations     (Consider location/radiation/quality/duration/timing/severity/associated sxs/prior Treatment) HPI Heart racing since noon. Tried 2 extra doses of Diltiazem without relief. No CP/SOB. No syncope. No recent associated illness. Patient otherwise been well. She states she's had a history of palpitations for a long time but never specific diagnosis. She states that she used to take Cardizem as needed but for a number of years now has taken it as a regular dose and then take an additional dose if she develops symptoms. Past Medical History  Diagnosis Date  . Palpitation     hx  . Osteoarthritis   . Hypertension   . Hx of colonic polyp   . Allergy   . GERD (gastroesophageal reflux disease)   . Hyperlipidemia   . Asthma     infrequent problem  . Bruises easily   . DDD (degenerative disc disease)   . Choroidal nevus of left eye     sees Dr. Gerarda Fraction at [facility].    Past Surgical History  Procedure Laterality Date  . Breast biopsy    . Carpal tunnel release    . Cholecystectomy    . Abdominal hysterectomy    . Varicose vein surgery    . Synvisc inj      dr Maureen Ralphs 2008  . Colonoscopy  06-23-14    per Dr. Ardis Hughs, adenomatous polyps, repeat in 5 yrs    . Polypectomy    . Ankle surgery      torn tendon left ankle  . Achilles tendon repair  10-07-11    torn on right, per Dr. Noemi Chapel   . Total knee arthroplasty Left 12/21/2013    Procedure: LEFT TOTAL KNEE ARTHROPLASTY;  Surgeon: Gearlean Alf, MD;  Location: [facility];  Service: Orthopedics;  Laterality: Left;  . Knee closed reduction Left 02/03/2014    Procedure: CLOSED MANIPULATION LEFT KNEE;  Surgeon: Gearlean Alf, MD;  Location: [facility];  Service: Orthopedics;  Laterality: Left;   Family History  Problem Relation Age of Onset  . Breast  cancer    . Coronary artery disease    . Colon cancer    . Diabetes    . Hypertension    . Kidney disease    . Lung cancer    . Colon polyps Mother   . Colon cancer Maternal Aunt   . Liver cancer Maternal Aunt   . Lung cancer Brother     spleen, bone  . Esophageal cancer Neg Hx   . Stomach cancer Neg Hx   . Rectal cancer Neg Hx    Social History  Substance Use Topics  . Smoking status: Never Smoker   . Smokeless tobacco: Never Used  . Alcohol Use: No   OB History    No data available     Review of Systems 10 Systems reviewed and are negative for acute change except as noted in the HPI.    Allergies  Codeine; Levaquin; Augmentin; and Lexapro  Home Medications   Prior to Admission medications   Medication Sig Start Date End Date Taking? Authorizing Provider  albuterol (PROVENTIL HFA;VENTOLIN HFA) 108 (90 BASE) MCG/ACT inhaler Inhale 2 puffs into the lungs every 4 (four) hours as needed for wheezing or shortness of breath. Patient not taking: Reported on 02/13/2016 02/03/15  Laurey Morale, MD  CALCIUM PO Take by mouth daily.    Historical Provider, MD  Cholecalciferol (VITAMIN D-3 PO) Take by mouth daily.    Historical Provider, MD  diltiazem (CARDIZEM CD) 120 MG 24 hr capsule Take 1 capsule by mouth  daily 01/17/16   Laurey Morale, MD  loratadine (CLARITIN) 10 MG tablet Take 10 mg by mouth daily.    Historical Provider, MD  Multiple Vitamins-Minerals (MULTIVITAMIN PO) Take by mouth daily.    Historical Provider, MD  omeprazole (PRILOSEC) 40 MG capsule Take 1 capsule by mouth  daily 01/17/16   Laurey Morale, MD  ramipril (ALTACE) 5 MG capsule Take 1 capsule by mouth  daily 01/17/16   Laurey Morale, MD  rosuvastatin (CRESTOR) 10 MG tablet Take 1 tablet by mouth  daily 01/17/16   Laurey Morale, MD  triamcinolone (NASACORT) 55 MCG/ACT nasal inhaler Place 2 sprays into the nose daily. 01/29/13   Laurey Morale, MD   BP 127/66 mmHg  Pulse 78  Temp(Src) 98.4 F (36.9 C) (Oral)   Resp 17  SpO2 99% Physical Exam  Constitutional: She is oriented to person, place, and time. She appears well-developed and well-nourished.  HENT:  Head: Normocephalic and atraumatic.  Eyes: EOM are normal. Pupils are equal, round, and reactive to light.  Neck: Neck supple.  Cardiovascular: Regular rhythm, normal heart sounds and intact distal pulses.   Tachycardia  Pulmonary/Chest: Effort normal and breath sounds normal.  Abdominal: Soft. Bowel sounds are normal. She exhibits no distension. There is no tenderness.  Musculoskeletal: Normal range of motion. She exhibits no edema or tenderness.  Neurological: She is alert and oriented to person, place, and time. She has normal strength. Coordination normal. GCS eye subscore is 4. GCS verbal subscore is 5. GCS motor subscore is 6.  Skin: Skin is warm, dry and intact.  Psychiatric: She has a normal mood and affect.    ED Course  Procedures (including critical care time) Labs Review Labs Reviewed  COMPREHENSIVE METABOLIC PANEL - Abnormal; Notable for the following:    Sodium 134 (*)    Glucose, Bld 128 (*)    All other components within normal limits  TROPONIN I - Abnormal; Notable for the following:    Troponin I 0.87 (*)    All other components within normal limits  URINALYSIS, ROUTINE W REFLEX MICROSCOPIC (NOT AT [facility]) - Abnormal; Notable for the following:    Hgb urine dipstick TRACE (*)    All other components within normal limits  URINE MICROSCOPIC-ADD ON - Abnormal; Notable for the following:    Squamous Epithelial / LPF 0-5 (*)    Bacteria, UA RARE (*)    All other components within normal limits  CBC WITH DIFFERENTIAL/PLATELET  PROTIME-INR  MAGNESIUM  TSH  TROPONIN I    Imaging Review No results found. I have personally reviewed and evaluated these images and lab results as part of my medical decision-making.   EKG Interpretation   Date/Time:  Sunday Mar 11 2016 19:54:17 EDT Ventricular Rate:  143 PR Interval:     QRS Duration: 86 QT Interval:  298 QTC Calculation: 459 R Axis:   14 Text Interpretation:  Supraventricular tachycardia Septal infarct , age  undetermined Abnormal ECG Confirmed by Johnney Killian, MD, Jeannie Done [id number]) on  03/11/2016 10:49:33 PM     Consult: Reviewed with Dr. Wynonia Lawman. Will admit to Dr. Johnsie Cancel on for further monitoring and rule out of MI. MDM   Final diagnoses:  Paroxysmal SVT (supraventricular tachycardia) (HCC)  Troponin I above reference range   Shortly after initiating the patient's IV and drawn lab work, she spontaneously converted to sinus rhythm. Patient did not have chest pain. She did however have elevated troponin. Patient will be admitted for rule out of MI and further cardiac evaluation.  Charlesetta Shanks, MD 03/14/16 [phone]

## 2016-03-11 NOTE — ED Notes (Signed)
Pt reports palpitations since noon today. Took cardizem at noon and an extra at 1600. Pt reports palpitations have continued. Denies shortness of breath or nausea.

## 2016-03-11 NOTE — H&P (Signed)
History and Physical   Admit date: 03/11/2016 Name:  Natalie Hall Medical record number: [id number] DOB/Age:  [birth year]  65 y.o. female  Referring Physician:   [facility] [place]  Primary Cardiologist:  Dr. Johnsie Cancel  Primary Physician:   Dr. Delma Freeze  Chief complaint/reason for admission: Recent elevated heart rate  HPI:  This 65 year old female has a history of supraventricular tachycardia in the past.  She saw Dr. Johnsie Cancel a number of years ago and was given Cardizem to use but has not had a visit with him in a couple of years.  She had the onset of palpitations around noon today and took a diltiazem and took another one this afternoon around 4 and then a third one without success.  She  later presented to [facility] Center where she was found to be in recurrent SVT.  The overall duration was around 8 hours.   she evidently spontaneously converted in the emergency room there after having been given aspirin.  She did not have any chest pain and normally denies PND, orthopnea or edema.  She does have hypertension and hyperlipidemia.   Past Medical History  Diagnosis Date  . Osteoarthritis   . Hypertension   . Hx of colonic polyp   . GERD (gastroesophageal reflux disease)   . Hyperlipidemia   . Asthma     infrequent problem  . Choroidal nevus of left eye     sees Dr. Gerarda Fraction at [facility].   . Supraventricular tachycardia [facility])      Past Surgical History  Procedure Laterality Date  . Breast biopsy    . Carpal tunnel release    . Cholecystectomy    . Abdominal hysterectomy    . Varicose vein surgery    . Colonoscopy  06-23-14    per Dr. Ardis Hughs, adenomatous polyps, repeat in 5 yrs    . Polypectomy    . Ankle surgery      torn tendon left ankle  . Achilles tendon repair  10-07-11    torn on right, per Dr. Noemi Chapel   . Total knee arthroplasty Left 12/21/2013    Procedure: LEFT TOTAL KNEE ARTHROPLASTY;  Surgeon: Gearlean Alf, MD;  Location: [facility];  Service: Orthopedics;   Laterality: Left;  . Knee closed reduction Left 02/03/2014    Procedure: CLOSED MANIPULATION LEFT KNEE;  Surgeon: Gearlean Alf, MD;  Location: [facility];  Service: Orthopedics;  Laterality: Left;   Allergies: is allergic to codeine; levaquin; augmentin; and lexapro.   Medications: Prior to Admission medications   Medication Sig Start Date End Date Taking? Authorizing Provider  albuterol (PROVENTIL HFA;VENTOLIN HFA) 108 (90 BASE) MCG/ACT inhaler Inhale 2 puffs into the lungs every 4 (four) hours as needed for wheezing or shortness of breath. Patient not taking: Reported on 02/13/2016 02/03/15   Laurey Morale, MD  CALCIUM PO Take by mouth daily.    Historical Provider, MD  Cholecalciferol (VITAMIN D-3 PO) Take by mouth daily.    Historical Provider, MD  diltiazem (CARDIZEM CD) 120 MG 24 hr capsule Take 1 capsule by mouth  daily 01/17/16   Laurey Morale, MD  loratadine (CLARITIN) 10 MG tablet Take 10 mg by mouth daily.    Historical Provider, MD  Multiple Vitamins-Minerals (MULTIVITAMIN PO) Take by mouth daily.    Historical Provider, MD  omeprazole (PRILOSEC) 40 MG capsule Take 1 capsule by mouth  daily 01/17/16   Laurey Morale, MD  ramipril (ALTACE) 5 MG capsule  Take 1 capsule by mouth  daily 01/17/16   Laurey Morale, MD  rosuvastatin (CRESTOR) 10 MG tablet Take 1 tablet by mouth  daily 01/17/16   Laurey Morale, MD  triamcinolone (NASACORT) 55 MCG/ACT nasal inhaler Place 2 sprays into the nose daily. 01/29/13   Laurey Morale, MD   Family History:  Family Status  Relation Status Death Age  . Mother Alive   . Maternal Aunt Deceased    Social History:   reports that she has never smoked. She has never used smokeless tobacco. She reports that she does not drink alcohol or use illicit drugs.  Lives with her husband   Review of Systems: She recently finished being treated for urinary tract infection.  She describes some pain involving her right knee and has been taking tramadol for this.  She has  recently received simvastatin injections.  She is due to have left shoulder surgery this summer.  She recently finished taking a course of Bactrim for urinary tract infection.  She has had previous vein stripping at the vein clinic previously.   Other than as noted above, the remainder of the review of systems is normal  Physical Exam: BP 153/66 mmHg  Pulse 76  Temp(Src) 98.2 F (36.8 C) (Oral)  Resp 20  Ht 5\' 4"  (1.626 m)  Wt 96.752 kg (213 lb 4.8 oz)  BMI 36.59 kg/m2  SpO2 100%  General appearance: Pleasant white female in no acute distress Head: Normocephalic, without obvious abnormality, atraumatic Eyes: conjunctivae/corneas clear. PERRL, EOM's intact. Fundi not examined  Neck: no adenopathy, no carotid bruit, no JVD and supple, symmetrical, trachea midline Lungs: clear to auscultation bilaterally Heart: regular rate and rhythm, S1, S2 normal, no murmur, click, rub or gallop Abdomen: soft, non-tender; bowel sounds normal; no masses,  no organomegaly Pelvic: deferred Extremities: Healed scar from previous knee surgery, no edema, changes of chronic venous insufficiency noted Pulses: 2+ and symmetric Skin: Skin color, texture, turgor normal. No rashes or lesions Neurologic: Grossly normal  Labs: CBC  Recent Labs  03/11/16 2010  WBC 5.6  RBC 5.03  HGB 13.4  HCT 40.0  PLT 237  MCV 79.5  MCH 26.6  MCHC 33.5  RDW 15.1  LYMPHSABS 2.6  MONOABS 0.6  EOSABS 0.2  BASOSABS 0.0   CMP   Recent Labs  03/11/16 2010  NA 134*  K 3.9  CL 103  CO2 22  GLUCOSE 128*  BUN 18  CREATININE 0.61  CALCIUM 8.9  PROT 7.2  ALBUMIN 3.7  AST 35  ALT 31  ALKPHOS 60  BILITOT 0.5  GFRNONAA >60  GFRAA >60   Cardiac Panel (last 3 results)  Recent Labs  03/11/16 2010 03/11/16 2300  TROPONINI 0.87* 1.64*    EKG: Initial EKG at [place] shows supraventricular tachycardia with some ST depression at a rate of 144.  Repeat EKG at 2234 shows sinus rhythm with a possible old  anteroseptal infarction but there are no ischemic abnormalities.   IMPRESSIONS: 1.  Prolonged supraventricular tachycardia that his terminated 2.  Demand ischemia likely related to prolonged SVT 3.  Hypertension 4.  Hyperlipidemia  PLAN: Obtain serial troponins.  Echocardiogram in the morning.  This likely is just demand ischemia related to prolonged SVT.  However the overall elevations are a little more than we would suspect and need to be sure that she does not have a serial trend suggestive of a non-STEMI.    Signed: Kerry Hough MD [facility]  03/12/2016, 12:55 AM

## 2016-03-11 NOTE — ED Notes (Signed)
MD aware of positive troponin result. No further orders received at this time.

## 2016-03-11 NOTE — ED Notes (Signed)
carelink at bedside 

## 2016-03-12 ENCOUNTER — Observation Stay (HOSPITAL_COMMUNITY): Payer: Medicare Other

## 2016-03-12 ENCOUNTER — Telehealth: Payer: Self-pay | Admitting: Family Medicine

## 2016-03-12 DIAGNOSIS — I471 Supraventricular tachycardia: Secondary | ICD-10-CM | POA: Diagnosis not present

## 2016-03-12 DIAGNOSIS — R931 Abnormal findings on diagnostic imaging of heart and coronary circulation: Secondary | ICD-10-CM | POA: Diagnosis not present

## 2016-03-12 DIAGNOSIS — I214 Non-ST elevation (NSTEMI) myocardial infarction: Secondary | ICD-10-CM | POA: Diagnosis not present

## 2016-03-12 DIAGNOSIS — I248 Other forms of acute ischemic heart disease: Secondary | ICD-10-CM

## 2016-03-12 DIAGNOSIS — R9431 Abnormal electrocardiogram [ECG] [EKG]: Secondary | ICD-10-CM

## 2016-03-12 DIAGNOSIS — Z955 Presence of coronary angioplasty implant and graft: Secondary | ICD-10-CM | POA: Diagnosis not present

## 2016-03-12 LAB — CBC
HEMATOCRIT: 37.7 % (ref 36.0–46.0)
Hemoglobin: 12.1 g/dL (ref 12.0–15.0)
MCH: 25.7 pg — ABNORMAL LOW (ref 26.0–34.0)
MCHC: 32.1 g/dL (ref 30.0–36.0)
MCV: 80 fL (ref 78.0–100.0)
Platelets: 210 10*3/uL (ref 150–400)
RBC: 4.71 MIL/uL (ref 3.87–5.11)
RDW: 14.8 % (ref 11.5–15.5)
WBC: 5.2 10*3/uL (ref 4.0–10.5)

## 2016-03-12 LAB — LIPID PANEL
Cholesterol: 128 mg/dL (ref 0–200)
HDL: 20 mg/dL — AB (ref >40)
LDL Cholesterol: 66 mg/dL (ref 0–99)
TRIGLYCERIDES: 210 mg/dL — AB (ref <150)
Total CHOL/HDL Ratio: 6.4 RATIO
VLDL: 42 mg/dL — ABNORMAL HIGH (ref 0–40)

## 2016-03-12 LAB — TROPONIN I
TROPONIN I: 0.98 ng/mL — AB (ref <0.031)
TROPONIN I: 0.73 ng/mL — AB (ref <0.031)
Troponin I: 1.64 ng/mL (ref <0.031)
Troponin I: 1.38 ng/mL (ref <0.031)

## 2016-03-12 LAB — CREATININE, SERUM
Creatinine, Ser: 0.54 mg/dL (ref 0.44–1.00)
GFR calc Af Amer: >60 mL/min (ref >60)

## 2016-03-12 LAB — ECHOCARDIOGRAM COMPLETE
HEIGHTINCHES: 64 in
Weight: 3412.8 oz

## 2016-03-12 LAB — TSH: TSH: 2.588 u[IU]/mL (ref 0.350–4.500)

## 2016-03-12 MED ORDER — ASPIRIN EC 81 MG PO TBEC
81.0000 mg | DELAYED_RELEASE_TABLET | Freq: Every day | ORAL | Status: DC
  Administered 2016-03-12 – 2016-03-15 (×4): 81 mg via ORAL
  Filled 2016-03-12 (×4): qty 1

## 2016-03-12 MED ORDER — ENOXAPARIN SODIUM 40 MG/0.4ML ~~LOC~~ SOLN
40.0000 mg | SUBCUTANEOUS | Status: DC
  Administered 2016-03-12 – 2016-03-13 (×2): 40 mg via SUBCUTANEOUS
  Filled 2016-03-12 (×2): qty 0.4

## 2016-03-12 MED ORDER — DILTIAZEM HCL ER COATED BEADS 240 MG PO CP24
240.0000 mg | ORAL_CAPSULE | Freq: Every day | ORAL | Status: DC
  Administered 2016-03-12 – 2016-03-14 (×3): 240 mg via ORAL
  Filled 2016-03-12 (×3): qty 1

## 2016-03-12 MED ORDER — PANTOPRAZOLE SODIUM 40 MG PO TBEC
40.0000 mg | DELAYED_RELEASE_TABLET | Freq: Every day | ORAL | Status: DC
  Administered 2016-03-12 – 2016-03-15 (×4): 40 mg via ORAL
  Filled 2016-03-12 (×4): qty 1

## 2016-03-12 MED ORDER — NITROGLYCERIN 0.4 MG SL SUBL: 0.4000 mg | SUBLINGUAL_TABLET | SUBLINGUAL | Status: DC | PRN

## 2016-03-12 MED ORDER — ONDANSETRON HCL 4 MG/2ML IJ SOLN: 4.0000 mg | Freq: Four times a day (QID) | INTRAMUSCULAR | Status: DC | PRN

## 2016-03-12 MED ORDER — ACETAMINOPHEN 325 MG PO TABS: 650.0000 mg | ORAL_TABLET | ORAL | Status: DC | PRN

## 2016-03-12 MED ORDER — PERFLUTREN LIPID MICROSPHERE
1.0000 mL | INTRAVENOUS | Status: AC | PRN
  Administered 2016-03-12: 2 mL via INTRAVENOUS
  Filled 2016-03-12: qty 10

## 2016-03-12 MED ORDER — RAMIPRIL 5 MG PO CAPS
5.0000 mg | ORAL_CAPSULE | Freq: Every day | ORAL | Status: DC
  Administered 2016-03-12 – 2016-03-14 (×3): 5 mg via ORAL
  Filled 2016-03-12 (×4): qty 1

## 2016-03-12 MED ORDER — ROSUVASTATIN CALCIUM 10 MG PO TABS
10.0000 mg | ORAL_TABLET | Freq: Every day | ORAL | Status: DC
  Administered 2016-03-12 – 2016-03-15 (×4): 10 mg via ORAL
  Filled 2016-03-12 (×4): qty 1

## 2016-03-12 NOTE — Care Management Obs Status (Signed)
[place] NOTIFICATION   Patient Details  Name: Natalie Hall MRN: [id number] Date of Birth: November 27, 1950   Medicare Observation Status Notification Given:  Yes    Dawayne Patricia, RN 03/12/2016, 12:36 PM

## 2016-03-12 NOTE — Progress Notes (Signed)
IV  Converted to NSL @ 1:15pm   Mervyn Skeeters, RN

## 2016-03-12 NOTE — Progress Notes (Signed)
DAILY PROGRESS NOTE  Subjective:  No events overnight. No further SVT. Troponins increased to 1.64, then trended down. No chest pain during the event. Planning upcoming shoulder surgery.  Objective:  Temp:  [97.9 F (36.6 C)-98.4 F (36.9 C)] 97.9 F (36.6 C) (05/22 0543) Pulse Rate:  [76-147] 81 (05/22 0543) Resp:  [16-20] 18 (05/22 0543) BP: (112-153)/(57-98) 134/60 mmHg (05/22 0543) SpO2:  [95 %-100 %] 96 % (05/22 0543) Weight:  [213 lb 4.8 oz (96.752 kg)] 213 lb 4.8 oz (96.752 kg) (05/22 0020) Weight change:   Intake/Output from previous day:    Intake/Output from this shift: Total I/O In: 240 [P.O.:240] Out: -   Medications: Current Facility-Administered Medications  Medication Dose Route Frequency Provider Last Rate Last Dose  . 0.9 %  sodium chloride infusion   Intravenous Continuous Charlesetta Shanks, MD 100 mL/hr at 03/12/16 0114 100 mL/hr at 03/12/16 0114  . acetaminophen (TYLENOL) tablet 650 mg  650 mg Oral Q4H PRN Jacolyn Reedy, MD      . aspirin EC tablet 81 mg  81 mg Oral Daily Jacolyn Reedy, MD      . diltiazem (CARDIZEM CD) 24 hr capsule 240 mg  240 mg Oral Daily Jacolyn Reedy, MD      . enoxaparin (LOVENOX) injection 40 mg  40 mg Subcutaneous Q24H Jacolyn Reedy, MD   40 mg at 03/12/16 0902  . nitroGLYCERIN (NITROSTAT) SL tablet 0.4 mg  0.4 mg Sublingual Q5 Min x 3 PRN Jacolyn Reedy, MD      . ondansetron [facility]) injection 4 mg  4 mg Intravenous Q6H PRN Jacolyn Reedy, MD      . pantoprazole (PROTONIX) EC tablet 40 mg  40 mg Oral Daily Jacolyn Reedy, MD      . ramipril (ALTACE) capsule 5 mg  5 mg Oral Daily Jacolyn Reedy, MD      . rosuvastatin (CRESTOR) tablet 10 mg  10 mg Oral Daily Jacolyn Reedy, MD        Physical Exam: General appearance: alert and no distress Lungs: clear to auscultation bilaterally Heart: regular rate and rhythm, S1, S2 normal, no murmur, click, rub or gallop Extremities: extremities normal, atraumatic,  no cyanosis or edema Neurologic: Grossly normal  Lab Results: Results for orders placed or performed during the hospital encounter of 03/11/16 (from the past 48 hour(s))  Comprehensive metabolic panel     Status: Abnormal   Collection Time: 03/11/16  8:10 PM  Result Value Ref Range   Sodium 134 (L) 135 - 145 mmol/L   Potassium 3.9 3.5 - 5.1 mmol/L   Chloride 103 101 - 111 mmol/L   CO2 22 22 - 32 mmol/L   Glucose, Bld 128 (H) 65 - 99 mg/dL   BUN 18 6 - 20 mg/dL   Creatinine, Ser 0.61 0.44 - 1.00 mg/dL   Calcium 8.9 8.9 - 10.3 mg/dL   Total Protein 7.2 6.5 - 8.1 g/dL   Albumin 3.7 3.5 - 5.0 g/dL   AST 35 15 - 41 U/L   ALT 31 14 - 54 U/L   Alkaline Phosphatase 60 38 - 126 U/L   Total Bilirubin 0.5 0.3 - 1.2 mg/dL   GFR calc non Af Amer >60 >60 mL/min   GFR calc Af Amer >60 >60 mL/min    Comment: (NOTE) The eGFR has been calculated using the CKD EPI equation. This calculation has not been validated in all clinical situations. eGFR's persistently <  60 mL/min signify possible Chronic Kidney Disease.    Anion gap 9 5 - 15  Troponin I     Status: Abnormal   Collection Time: 03/11/16  8:10 PM  Result Value Ref Range   Troponin I 0.87 (HH) <0.031 ng/mL    Comment:        POSSIBLE MYOCARDIAL ISCHEMIA. SERIAL TESTING RECOMMENDED. RESULT REPEATED AND VERIFIED CRITICAL RESULT CALLED TO, READ BACK BY AND VERIFIED WITH: Maynard,C @ 2120 ON 03/11/2016 BY Punjtan,G   CBC with Differential     Status: None   Collection Time: 03/11/16  8:10 PM  Result Value Ref Range   WBC 5.6 4.0 - 10.5 K/uL   RBC 5.03 3.87 - 5.11 MIL/uL   Hemoglobin 13.4 12.0 - 15.0 g/dL   HCT 40.0 36.0 - 46.0 %   MCV 79.5 78.0 - 100.0 fL   MCH 26.6 26.0 - 34.0 pg   MCHC 33.5 30.0 - 36.0 g/dL   RDW 15.1 11.5 - 15.5 %   Platelets 237 150 - 400 K/uL   Neutrophils Relative % 41 %   Neutro Abs 2.3 1.7 - 7.7 K/uL   Lymphocytes Relative 45 %   Lymphs Abs 2.6 0.7 - 4.0 K/uL   Monocytes Relative 11 %   Monocytes  Absolute 0.6 0.1 - 1.0 K/uL   Eosinophils Relative 3 %   Eosinophils Absolute 0.2 0.0 - 0.7 K/uL   Basophils Relative 0 %   Basophils Absolute 0.0 0.0 - 0.1 K/uL  Protime-INR     Status: None   Collection Time: 03/11/16  8:10 PM  Result Value Ref Range   Prothrombin Time 12.6 11.6 - 15.2 seconds   INR 0.92 0.00 - 1.49  Magnesium     Status: None   Collection Time: 03/11/16  8:10 PM  Result Value Ref Range   Magnesium 1.9 1.7 - 2.4 mg/dL  Urinalysis, Routine w reflex microscopic     Status: Abnormal   Collection Time: 03/11/16  8:50 PM  Result Value Ref Range   Color, Urine YELLOW YELLOW   APPearance CLEAR CLEAR   Specific Gravity, Urine 1.006 1.005 - 1.030   pH 6.5 5.0 - 8.0   Glucose, UA NEGATIVE NEGATIVE mg/dL   Hgb urine dipstick TRACE (A) NEGATIVE   Bilirubin Urine NEGATIVE NEGATIVE   Ketones, ur NEGATIVE NEGATIVE mg/dL   Protein, ur NEGATIVE NEGATIVE mg/dL   Nitrite NEGATIVE NEGATIVE   Leukocytes, UA NEGATIVE NEGATIVE  Urine microscopic-add on     Status: Abnormal   Collection Time: 03/11/16  8:50 PM  Result Value Ref Range   Squamous Epithelial / LPF 0-5 (A) NONE SEEN   WBC, UA 0-5 0 - 5 WBC/hpf   RBC / HPF 0-5 0 - 5 RBC/hpf   Bacteria, UA RARE (A) NONE SEEN   Urine-Other MUCOUS PRESENT   TSH     Status: None   Collection Time: 03/11/16  9:20 PM  Result Value Ref Range   TSH 2.588 0.350 - 4.500 uIU/mL    Comment: Performed at [facility]  Troponin I     Status: Abnormal   Collection Time: 03/11/16 11:00 PM  Result Value Ref Range   Troponin I 1.64 (HH) <0.031 ng/mL    Comment:        POSSIBLE MYOCARDIAL ISCHEMIA. SERIAL TESTING RECOMMENDED. CRITICAL RESULT CALLED TO, READ BACK BY AND VERIFIED WITH: PAM WASHINGTON RN AT 0008 03/12/16 BY I.[email or web address]   Troponin I-(serum)     Status: Abnormal  Collection Time: 03/12/16  1:14 AM  Result Value Ref Range   Troponin I 1.38 (HH) <0.031 ng/mL    Comment:        POSSIBLE MYOCARDIAL ISCHEMIA. SERIAL  TESTING RECOMMENDED. CRITICAL RESULT CALLED TO, READ BACK BY AND VERIFIED WITH: WASHINGTON,T RN [id number] 5.22.17 MCADOO,G   Lipid panel     Status: Abnormal   Collection Time: 03/12/16  1:14 AM  Result Value Ref Range   Cholesterol 128 0 - 200 mg/dL   Triglycerides 210 (H) <150 mg/dL   HDL 20 (L) >40 mg/dL   Total CHOL/HDL Ratio 6.4 RATIO   VLDL 42 (H) 0 - 40 mg/dL   LDL Cholesterol 66 0 - 99 mg/dL    Comment:        Total Cholesterol/HDL:CHD Risk Coronary Heart Disease Risk Table                     Men   Women  1/2 Average Risk   3.4   3.3  Average Risk       5.0   4.4  2 X Average Risk   9.6   7.1  3 X Average Risk  23.4   11.0        Use the calculated Patient Ratio above and the CHD Risk Table to determine the patient's CHD Risk.        ATP III CLASSIFICATION (LDL):  <100     mg/dL   Optimal  100-129  mg/dL   Near or Above                    Optimal  130-159  mg/dL   Borderline  160-189  mg/dL   High  >190     mg/dL   Very High   CBC     Status: Abnormal   Collection Time: 03/12/16  1:14 AM  Result Value Ref Range   WBC 5.2 4.0 - 10.5 K/uL   RBC 4.71 3.87 - 5.11 MIL/uL   Hemoglobin 12.1 12.0 - 15.0 g/dL   HCT 37.7 36.0 - 46.0 %   MCV 80.0 78.0 - 100.0 fL   MCH 25.7 (L) 26.0 - 34.0 pg   MCHC 32.1 30.0 - 36.0 g/dL   RDW 14.8 11.5 - 15.5 %   Platelets 210 150 - 400 K/uL  Creatinine, serum     Status: None   Collection Time: 03/12/16  1:14 AM  Result Value Ref Range   Creatinine, Ser 0.54 0.44 - 1.00 mg/dL   GFR calc non Af Amer >60 >60 mL/min   GFR calc Af Amer >60 >60 mL/min    Comment: (NOTE) The eGFR has been calculated using the CKD EPI equation. This calculation has not been validated in all clinical situations. eGFR's persistently <60 mL/min signify possible Chronic Kidney Disease.   Troponin I-(serum)     Status: Abnormal   Collection Time: 03/12/16  6:46 AM  Result Value Ref Range   Troponin I 0.98 (HH) <0.031 ng/mL    Comment:        POSSIBLE  MYOCARDIAL ISCHEMIA. SERIAL TESTING RECOMMENDED. CRITICAL VALUE NOTED.  VALUE IS CONSISTENT WITH PREVIOUSLY REPORTED AND CALLED VALUE.     Imaging: No results found.  Assessment:  1. Principal Problem: 2.   Paroxysmal SVT (supraventricular tachycardia) ([place]) 3. Active Problems: 4.   Demand ischemia ([place]) 5.   Supraventricular tachycardia ([place]) 6.   Plan:  1. Elevated troponin which is somewhat higher than expected  for 8 hours of SVT - could suggest underlying CAD. She denies any chest pain. Will proceed with a lexiscan myoview tomorrow (she ate breakfast today). Echo was in progress - my initial evaluation shows grossly normal LV function, however, will likely need definity to better visualize Evora motion. Keep NPO p MN for stress test tomorrow.  Time Spent Directly with Patient:  15 minutes  Length of Stay:   Pixie Casino, MD, [facility] Attending Cardiologist [place] 03/12/2016, 9:07 AM

## 2016-03-12 NOTE — Telephone Encounter (Signed)
[facility] Night - Client TELEPHONE ADVICE RECORD [facility] Patient Name: Natalie Hall Gender: Female DOB: 11/27/1950 Age: 65 Y 10 M 4 D Return Phone Number: [phone] (Primary), [phone] (Secondary) Address: City/State/Zip: [place] Client [facility] Night - Client Client Site [facility] - Night Physician Fry, Stephen - MD Contact Type Call Who Is Calling Patient / Member / Family / Caregiver Call Type Triage / Clinical Caller Name Natalie Hall Relationship To Patient Spouse Return Phone Number [phone] (Primary) Chief Complaint Heart palpitations or irregular heartbeat Reason for Call Symptomatic / Request for Health Information Initial Comment Natalie Hall, Caller's wife is having rapid hear rate of 140. PreDisposition Go to ED Translation No Nurse Assessment Nurse: Kelley, RN, Yvonne Date/Time (Eastern Time): 03/11/2016 7:07:24 PM Confirm and document reason for call. If symptomatic, describe symptoms. You must click the next button to save text entered. ---Caller states his wife has been feeling a rapid heart rate since noon today & has taken 2 tablets of Diltiazem since then. Has the patient traveled out of the country within the last 30 days? ---No Does the patient have any new or worsening symptoms? ---Yes Will a triage be completed? ---Yes Related visit to physician within the last 2 weeks? ---No Does the PT have any chronic conditions? (i.e. diabetes, asthma, etc.) ---No Is this a behavioral health or substance abuse call? ---No Guidelines Guideline Title Affirmed Question Affirmed Notes Nurse Date/Time (Eastern Time) Heart Rate and Heartbeat Questions [1] Heart beating very rapidly (e.g., > 140 / minute) AND [2] present now (Exception: during exercise) Kelley, RN, Yvonne 03/11/2016 7:10:09 PM Disp. Time (Eastern Time) Disposition Final User 03/11/2016 7:12:09 PM  Go to ED Now Yes Kelley, RN, Yvonne PLEASE NOTE: All timestamps contained within this report are represented as Eastern Standard Time. CONFIDENTIALTY NOTICE: This fax transmission is intended only for the addressee. It contains information that is legally privileged, confidential or otherwise protected from use or disclosure. If you are not the intended recipient, you are strictly prohibited from reviewing, disclosing, copying using or disseminating any of this information or taking any action in reliance on or regarding this information. If you have received this fax in error, please notify us immediately by telephone so that we can arrange for its return to us. Phone: [phone], Toll-Free: [phone], Fax: [phone] Page: 2 of 2 Call Id: [id number] Caller Understands: Yes Disagree/Comply: Comply Care Advice Given Per Guideline GO TO ED NOW: You need to be seen in the Emergency Department. Go to the ER at ___________ Hospital. Leave now. Drive carefully. NOTE TO TRIAGER - DRIVING: * Another adult should drive. BRING MEDICINES: * Please bring a list of your current medicines when you go to the Emergency Department (ER). * It is also a good idea to bring the pill bottles too. This will help the doctor to make certain you are taking the right medicines and the right dose. CARE ADVICE given per Palpitations (Adult) guideline. Referrals [facility] - ED 

## 2016-03-12 NOTE — Progress Notes (Signed)
CRITICAL VALUE ALERT  Critical value received: Troponin 1.64  Date of notification:  03/12/16  Time of notification:  0045  Critical value read back: yes   Nurse who received alert:  t Glenford Bayley   MD notified (1st page): Tilley,MD  Time of first page:  0050  MD notified (2nd page):  Time of second page:  Responding MD:  Wynonia Lawman  Time MD responded:  [phone]

## 2016-03-12 NOTE — Progress Notes (Signed)
  Echocardiogram 2D Echocardiogram with Definity  has been performed.  Darlina Sicilian M 03/12/2016, 10:08 AM

## 2016-03-12 NOTE — Progress Notes (Signed)
CRITICAL VALUE ALERT  Critical value received: Troponin 1.38  Date of notification:  03/12/2016  Time of notification:  0205  Critical value read back: yes  Nurse who received alert:  t Glenford Bayley MD notified (1st page): Wynonia Lawman  Time of first page:  0206  MD notified (2nd page):  Time of second page:  Responding MD: [id number]  Time MD responded:  [phone]

## 2016-03-12 NOTE — Telephone Encounter (Signed)
Pt admitted to Hospital .

## 2016-03-13 ENCOUNTER — Observation Stay (HOSPITAL_COMMUNITY): Payer: Medicare Other

## 2016-03-13 DIAGNOSIS — R931 Abnormal findings on diagnostic imaging of heart and coronary circulation: Secondary | ICD-10-CM

## 2016-03-13 DIAGNOSIS — I471 Supraventricular tachycardia: Secondary | ICD-10-CM | POA: Diagnosis not present

## 2016-03-13 DIAGNOSIS — R7989 Other specified abnormal findings of blood chemistry: Secondary | ICD-10-CM

## 2016-03-13 DIAGNOSIS — I248 Other forms of acute ischemic heart disease: Secondary | ICD-10-CM | POA: Diagnosis not present

## 2016-03-13 DIAGNOSIS — I214 Non-ST elevation (NSTEMI) myocardial infarction: Secondary | ICD-10-CM | POA: Diagnosis present

## 2016-03-13 LAB — NM MYOCAR MULTI W/SPECT W/WALL MOTION / EF
CHL CUP MPHR: 155 {beats}/min
CHL CUP RESTING HR STRESS: 82 {beats}/min
CHL RATE OF PERCEIVED EXERTION: 0
CSEPEDS: 0 s
CSEPHR: 67 %
CSEPPHR: 104 {beats}/min
Estimated workload: 1 METS
Exercise duration (min): 0 min

## 2016-03-13 MED ORDER — TECHNETIUM TC 99M TETROFOSMIN IV KIT
30.0000 | PACK | Freq: Once | INTRAVENOUS | Status: AC | PRN
  Administered 2016-03-13: 30 via INTRAVENOUS

## 2016-03-13 MED ORDER — REGADENOSON 0.4 MG/5ML IV SOLN
INTRAVENOUS | Status: AC
  Filled 2016-03-13: qty 5

## 2016-03-13 MED ORDER — REGADENOSON 0.4 MG/5ML IV SOLN
0.4000 mg | Freq: Once | INTRAVENOUS | Status: AC
  Administered 2016-03-13: 0.4 mg via INTRAVENOUS
  Filled 2016-03-13: qty 5

## 2016-03-13 MED ORDER — TECHNETIUM TC 99M TETROFOSMIN IV KIT
10.0000 | PACK | Freq: Once | INTRAVENOUS | Status: AC | PRN
Start: 2016-03-13 — End: 2016-03-13
  Administered 2016-03-13: 10 via INTRAVENOUS

## 2016-03-13 NOTE — Progress Notes (Signed)
lexiscan myoview completed without complications.  nuc results to follow. 

## 2016-03-13 NOTE — Progress Notes (Signed)
Patient Name: Natalie Hall Date of Encounter: 03/13/2016  Principal Problem:   Paroxysmal SVT (supraventricular tachycardia) (HCC) Active Problems:   Demand ischemia ([place])   Supraventricular tachycardia [facility])   Primary Cardiologist: Dr Johnsie Cancel Patient Profile: 65 yo female w/ hx SVT, HTN, GERD, HLD, was admitted 05/21 w/ SVT>>SR in ER without rx  SUBJECTIVE: No more palpitations since admit  OBJECTIVE Filed Vitals:   03/13/16 1033 03/13/16 1035 03/13/16 1037 03/13/16 1039  BP: 154/72 164/72 159/71 155/70  Pulse:      Temp:      TempSrc:      Resp:      Height:      Weight:      SpO2:        Intake/Output Summary (Last 24 hours) at 03/13/16 1134 Last data filed at 03/12/16 1800  Gross per 24 hour  Intake    480 ml  Output      0 ml  Net    480 ml   Filed Weights   03/12/16 0020  Weight: 213 lb 4.8 oz (96.752 kg)    PHYSICAL EXAM General: Well developed, well nourished, female in no acute distress. Head: Normocephalic, atraumatic.  Neck: Supple without bruits, JVD not elevated. Lungs:  Resp regular and unlabored, CTA. Heart: RRR, S1, S2, no S3, S4, or murmur; no rub. Abdomen: Soft, non-tender, non-distended, BS + x 4.  Extremities: No clubbing, cyanosis, edema.  Neuro: Alert and oriented X 3. Moves all extremities spontaneously. Psych: Normal affect.  LABS: CBC: Recent Labs  03/11/16 2010 03/12/16 0114  WBC 5.6 5.2  NEUTROABS 2.3  --   HGB 13.4 12.1  HCT 40.0 37.7  MCV 79.5 80.0  PLT 237 210   INR: Recent Labs  03/11/16 2010  INR [id number]   Basic Metabolic Panel: Recent Labs  03/11/16 2010 03/12/16 0114  NA 134*  --   K 3.9  --   CL 103  --   CO2 22  --   GLUCOSE 128*  --   BUN 18  --   CREATININE 0.61 0.54  CALCIUM 8.9  --   MG 1.9  --    Liver Function Tests: Recent Labs  03/11/16 2010  AST 35  ALT 31  ALKPHOS 60  BILITOT 0.5  PROT 7.2  ALBUMIN 3.7   Cardiac Enzymes: Recent Labs  03/12/16 0114 03/12/16 0646  03/12/16 1144  TROPONINI 1.38* 0.98* 0.73*   Fasting Lipid Panel: Recent Labs  03/12/16 0114  CHOL 128  HDL 20*  LDLCALC 66  TRIG 210*  CHOLHDL 6.4   Thyroid Function Tests: Recent Labs  03/11/16 2120  TSH 2.588   TELE:   SR, no significant ectopy     ECHO: 05/22 - Left ventricle: The cavity size was normal. Santucci thickness was  normal. Systolic function was vigorous. The estimated ejection  fraction was in the range of 65% to 70%. Doppler parameters are  consistent with abnormal left ventricular relaxation (grade 1  diastolic dysfunction).  Radiology/Studies: No results found.   Current Medications:  . aspirin EC  81 mg Oral Daily  . diltiazem  240 mg Oral Daily  . enoxaparin (LOVENOX) injection  40 mg Subcutaneous Q24H  . pantoprazole  40 mg Oral Daily  . ramipril  5 mg Oral Daily  . regadenoson      . rosuvastatin  10 mg Oral Daily   . sodium chloride 100 mL/hr (03/12/16 0114)    ASSESSMENT AND PLAN:  Principal Problem:   Paroxysmal SVT (supraventricular tachycardia) (HCC) - tolerating Cardizem CD 240 mg, increased from home dose of 120 mg qd - maintaining SR  Active Problems:   Demand ischemia (HCC) - Elevated troponin which is somewhat higher than expected for 8 hours of SVT - could suggest underlying CAD. She denies any chest pain. Proceed with a lexiscan myoview. D/C if negative  - EF nl by echo, results above.    Supraventricular tachycardia ([place]) - see above.  Plan: d/c if MV neg  Jonetta Speak , PA-C 11:34 AM 03/13/2016

## 2016-03-14 ENCOUNTER — Encounter (HOSPITAL_COMMUNITY)
Admission: EM | Disposition: A | Discharge status: Home / Self Care | Payer: Self-pay | Source: Home / Self Care | Attending: Cardiovascular Disease

## 2016-03-14 DIAGNOSIS — I10 Essential (primary) hypertension: Secondary | ICD-10-CM | POA: Diagnosis not present

## 2016-03-14 DIAGNOSIS — I214 Non-ST elevation (NSTEMI) myocardial infarction: Principal | ICD-10-CM

## 2016-03-14 DIAGNOSIS — I2584 Coronary atherosclerosis due to calcified coronary lesion: Secondary | ICD-10-CM | POA: Diagnosis present

## 2016-03-14 DIAGNOSIS — K219 Gastro-esophageal reflux disease without esophagitis: Secondary | ICD-10-CM | POA: Diagnosis not present

## 2016-03-14 DIAGNOSIS — Z881 Allergy status to other antibiotic agents status: Secondary | ICD-10-CM | POA: Diagnosis not present

## 2016-03-14 DIAGNOSIS — Z955 Presence of coronary angioplasty implant and graft: Secondary | ICD-10-CM

## 2016-03-14 DIAGNOSIS — I471 Supraventricular tachycardia: Secondary | ICD-10-CM | POA: Diagnosis not present

## 2016-03-14 DIAGNOSIS — Z8249 Family history of ischemic heart disease and other diseases of the circulatory system: Secondary | ICD-10-CM | POA: Diagnosis not present

## 2016-03-14 DIAGNOSIS — R002 Palpitations: Secondary | ICD-10-CM | POA: Diagnosis present

## 2016-03-14 DIAGNOSIS — E785 Hyperlipidemia, unspecified: Secondary | ICD-10-CM | POA: Diagnosis present

## 2016-03-14 DIAGNOSIS — I2510 Atherosclerotic heart disease of native coronary artery without angina pectoris: Secondary | ICD-10-CM | POA: Diagnosis not present

## 2016-03-14 DIAGNOSIS — Z888 Allergy status to other drugs, medicaments and biological substances status: Secondary | ICD-10-CM | POA: Diagnosis not present

## 2016-03-14 DIAGNOSIS — Z96652 Presence of left artificial knee joint: Secondary | ICD-10-CM | POA: Diagnosis present

## 2016-03-14 HISTORY — PX: CARDIAC CATHETERIZATION: SHX172

## 2016-03-14 HISTORY — DX: Presence of coronary angioplasty implant and graft: Z95.5

## 2016-03-14 LAB — POCT ACTIVATED CLOTTING TIME: ACTIVATED CLOTTING TIME: 544 s

## 2016-03-14 SURGERY — LEFT HEART CATH AND CORONARY ANGIOGRAPHY
Anesthesia: LOCAL

## 2016-03-14 MED ORDER — IOPAMIDOL (ISOVUE-370) INJECTION 76%
INTRAVENOUS | Status: DC | PRN
  Administered 2016-03-14: 170 mL via INTRAVENOUS

## 2016-03-14 MED ORDER — MIDAZOLAM HCL 2 MG/2ML IJ SOLN
INTRAMUSCULAR | Status: AC
  Filled 2016-03-14: qty 2

## 2016-03-14 MED ORDER — SODIUM CHLORIDE 0.9 % IV SOLN: 250.0000 mL | INTRAVENOUS | Status: DC | PRN

## 2016-03-14 MED ORDER — ANGIOPLASTY BOOK
Freq: Once | Status: AC
Start: 2016-03-14 — End: 2016-03-14
  Administered 2016-03-14: 21:00:00
  Filled 2016-03-14: qty 1

## 2016-03-14 MED ORDER — TICAGRELOR 90 MG PO TABS
ORAL_TABLET | ORAL | Status: DC | PRN
  Administered 2016-03-14: 180 mg via ORAL

## 2016-03-14 MED ORDER — FENTANYL CITRATE (PF) 100 MCG/2ML IJ SOLN
INTRAMUSCULAR | Status: AC
Start: 2016-03-14 — End: 2016-03-14
  Filled 2016-03-14: qty 2

## 2016-03-14 MED ORDER — HEPARIN (PORCINE) IN NACL 2-0.9 UNIT/ML-% IJ SOLN
INTRAMUSCULAR | Status: AC
  Filled 2016-03-14: qty 1000

## 2016-03-14 MED ORDER — SODIUM CHLORIDE 0.9% FLUSH: 3.0000 mL | INTRAVENOUS | Status: DC | PRN

## 2016-03-14 MED ORDER — LIDOCAINE HCL (PF) 1 % IJ SOLN
INTRAMUSCULAR | Status: AC
  Filled 2016-03-14: qty 30

## 2016-03-14 MED ORDER — TICAGRELOR 90 MG PO TABS
90.0000 mg | ORAL_TABLET | Freq: Two times a day (BID) | ORAL | Status: DC
  Administered 2016-03-15 (×2): 90 mg via ORAL
  Filled 2016-03-14 (×2): qty 1

## 2016-03-14 MED ORDER — TICAGRELOR 90 MG PO TABS
ORAL_TABLET | ORAL | Status: AC
  Filled 2016-03-14: qty 2

## 2016-03-14 MED ORDER — FENTANYL CITRATE (PF) 100 MCG/2ML IJ SOLN
INTRAMUSCULAR | Status: DC | PRN
  Administered 2016-03-14 (×3): 25 ug via INTRAVENOUS
  Administered 2016-03-14: 50 ug via INTRAVENOUS

## 2016-03-14 MED ORDER — IOPAMIDOL (ISOVUE-370) INJECTION 76%
INTRAVENOUS | Status: AC
  Filled 2016-03-14: qty 100

## 2016-03-14 MED ORDER — HEPARIN SODIUM (PORCINE) 1000 UNIT/ML IJ SOLN
INTRAMUSCULAR | Status: AC
  Filled 2016-03-14: qty 1

## 2016-03-14 MED ORDER — SODIUM CHLORIDE 0.9 % WEIGHT BASED INFUSION: 1.0000 mL/kg/h | INTRAVENOUS | Status: DC

## 2016-03-14 MED ORDER — FENTANYL CITRATE (PF) 100 MCG/2ML IJ SOLN
INTRAMUSCULAR | Status: AC
  Filled 2016-03-14: qty 2

## 2016-03-14 MED ORDER — MIDAZOLAM HCL 2 MG/2ML IJ SOLN
INTRAMUSCULAR | Status: DC | PRN
  Administered 2016-03-14 (×3): 1 mg via INTRAVENOUS

## 2016-03-14 MED ORDER — SODIUM CHLORIDE 0.9% FLUSH
3.0000 mL | Freq: Two times a day (BID) | INTRAVENOUS | Status: DC
  Administered 2016-03-15: 3 mL via INTRAVENOUS

## 2016-03-14 MED ORDER — SODIUM CHLORIDE 0.9 % WEIGHT BASED INFUSION
3.0000 mL/kg/h | INTRAVENOUS | Status: AC
  Administered 2016-03-14: 3 mL/kg/h via INTRAVENOUS

## 2016-03-14 MED ORDER — HEPARIN (PORCINE) IN NACL 2-0.9 UNIT/ML-% IJ SOLN
INTRAMUSCULAR | Status: DC | PRN
  Administered 2016-03-14: 1000 mL

## 2016-03-14 MED ORDER — SODIUM CHLORIDE 0.9% FLUSH: 3.0000 mL | Freq: Two times a day (BID) | INTRAVENOUS | Status: DC

## 2016-03-14 MED ORDER — VERAPAMIL HCL 2.5 MG/ML IV SOLN
INTRAVENOUS | Status: AC
  Filled 2016-03-14: qty 2

## 2016-03-14 MED ORDER — LIDOCAINE HCL (PF) 1 % IJ SOLN
INTRAMUSCULAR | Status: DC | PRN
  Administered 2016-03-14: 2.1 mL

## 2016-03-14 MED ORDER — HEPARIN SODIUM (PORCINE) 1000 UNIT/ML IJ SOLN
INTRAMUSCULAR | Status: DC | PRN
  Administered 2016-03-14: 5000 [IU] via INTRAVENOUS

## 2016-03-14 MED ORDER — BIVALIRUDIN 250 MG IV SOLR
INTRAVENOUS | Status: AC
  Filled 2016-03-14: qty 250

## 2016-03-14 MED ORDER — BIVALIRUDIN 250 MG IV SOLR
250.0000 mg | INTRAVENOUS | Status: DC | PRN
Start: 2016-03-14 — End: 2016-03-14
  Administered 2016-03-14: 1.75 mg/kg/h via INTRAVENOUS

## 2016-03-14 MED ORDER — NITROGLYCERIN 1 MG/10 ML FOR IR/CATH LAB
INTRA_ARTERIAL | Status: DC | PRN
  Administered 2016-03-14: 200 ug via INTRACORONARY

## 2016-03-14 MED ORDER — BIVALIRUDIN BOLUS VIA INFUSION - CUPID
INTRAVENOUS | Status: DC | PRN
  Administered 2016-03-14: 72.675 mg via INTRAVENOUS

## 2016-03-14 MED ORDER — NITROGLYCERIN 1 MG/10 ML FOR IR/CATH LAB
INTRA_ARTERIAL | Status: AC
  Filled 2016-03-14: qty 10

## 2016-03-14 MED ORDER — SODIUM CHLORIDE 0.9 % WEIGHT BASED INFUSION
3.0000 mL/kg/h | INTRAVENOUS | Status: DC
  Administered 2016-03-14: 3 mL/kg/h via INTRAVENOUS

## 2016-03-14 SURGICAL SUPPLY — 20 items
BALLN EMERGE MR 2.5X20 (BALLOONS) ×2
BALLN ~~LOC~~ EMERGE MR 3.25X20 (BALLOONS) ×2
BALLOON EMERGE MR 2.5X20 (BALLOONS) IMPLANT
BALLOON ~~LOC~~ EMERGE MR 3.25X20 (BALLOONS) IMPLANT
CATH INFINITI 5 FR JL3.5 (CATHETERS) ×1 IMPLANT
CATH INFINITI 5FR ANG PIGTAIL (CATHETERS) ×1 IMPLANT
CATH INFINITI JR4 5F (CATHETERS) ×1 IMPLANT
CATH VISTA GUIDE 6FR JR4 (CATHETERS) ×1 IMPLANT
DEVICE RAD COMP TR BAND LRG (VASCULAR PRODUCTS) ×1 IMPLANT
GLIDESHEATH SLEND SS 6F .021 (SHEATH) ×1 IMPLANT
GUIDELINER 6F (CATHETERS) ×1 IMPLANT
KIT ENCORE 26 ADVANTAGE (KITS) ×1 IMPLANT
KIT HEART LEFT (KITS) ×2 IMPLANT
PACK CARDIAC CATHETERIZATION (CUSTOM PROCEDURE TRAY) ×2 IMPLANT
STENT SYNERGY DES 3X32 (Permanent Stent) ×1 IMPLANT
SYR MEDRAD MARK V 150ML (SYRINGE) ×2 IMPLANT
TRANSDUCER W/STOPCOCK (MISCELLANEOUS) ×2 IMPLANT
TUBING CIL FLEX 10 FLL-RA (TUBING) ×2 IMPLANT
WIRE ASAHI PROWATER 180CM (WIRE) ×1 IMPLANT
WIRE SAFE-T 1.5MM-J .035X260CM (WIRE) ×1 IMPLANT

## 2016-03-14 NOTE — Interval H&P Note (Signed)
History and Physical Interval Note:  03/14/2016 3:29 PM  Natalie Hall  has presented today for surgery, with the diagnosis of abn stress test  The various methods of treatment have been discussed with the patient and family. After consideration of risks, benefits and other options for treatment, the patient has consented to  Procedure(s): Left Heart Cath and Coronary Angiography (N/A) as a surgical intervention .  The patient's history has been reviewed, patient examined, no change in status, stable for surgery.  I have reviewed the patient's chart and labs.  Questions were answered to the patient's satisfaction.    Cath Lab Visit (complete for each Cath Lab visit)  Clinical Evaluation Leading to the Procedure:   ACS: Yes.    Non-ACS:    Anginal Classification: CCS II  Anti-ischemic medical therapy: Minimal Therapy (1 class of medications)  Non-Invasive Test Results: No non-invasive testing performed  Prior CABG: No previous CABG       Natalie Hall [facility] 03/14/2016 3:29 PM

## 2016-03-14 NOTE — Progress Notes (Signed)
Patient Name: LOUANA AKIN Date of Encounter: 03/14/2016  Principal Problem:   Paroxysmal SVT (supraventricular tachycardia) ([place]) Active Problems:   Supraventricular tachycardia ([place])   NSTEMI (non-ST elevated myocardial infarction) [facility])   Primary Cardiologist: Dr Johnsie Cancel Patient Profile: 65 yo female w/ hx SVT, HTN, GERD, HLD, was admitted 05/21 w/ SVT>>SR in ER without rx, elevated troponin>> abnl MV>>cath 05/24  SUBJECTIVE: No chest pain, no palpitations, no SOB, ready for cath  OBJECTIVE Filed Vitals:   03/13/16 1405 03/13/16 2112 03/14/16 0528 03/14/16 0946  BP: 145/63 132/62 142/79 152/76  Pulse: 87 80 88   Temp: 97.6 F (36.4 C) 98.4 F (36.9 C) 97.6 F (36.4 C)   TempSrc: Oral Oral Oral   Resp: 16 16 16    Height:      Weight:   213 lb 11.2 oz (96.934 kg)   SpO2: 100% 99% 97%     Intake/Output Summary (Last 24 hours) at 03/14/16 1257 Last data filed at 03/13/16 1300  Gross per 24 hour  Intake    360 ml  Output      0 ml  Net    360 ml   Filed Weights   03/12/16 0020 03/14/16 0528  Weight: 213 lb 4.8 oz (96.752 kg) 213 lb 11.2 oz (96.934 kg)    PHYSICAL EXAM General: Well developed, well nourished, female in no acute distress. Head: Normocephalic, atraumatic.  Neck: Supple without bruits, JVD not elevated. Lungs:  Resp regular and unlabored, CTA. Heart: RRR, S1, S2, no S3, S4, or murmur; no rub. Abdomen: Soft, non-tender, non-distended, BS + x 4.  Extremities: No clubbing, cyanosis, edema.  Neuro: Alert and oriented X 3. Moves all extremities spontaneously. Psych: Normal affect.  LABS: CBC: Recent Labs  03/11/16 2010 03/12/16 0114  WBC 5.6 5.2  NEUTROABS 2.3  --   HGB 13.4 12.1  HCT 40.0 37.7  MCV 79.5 80.0  PLT 237 210   INR: Recent Labs  03/11/16 2010  INR 99991111   Basic Metabolic Panel: Recent Labs  03/11/16 2010 03/12/16 0114  NA 134*  --   K 3.9  --   CL 103  --   CO2 22  --   GLUCOSE 128*  --   BUN 18  --     CREATININE 0.61 0.54  CALCIUM 8.9  --   MG 1.9  --    Liver Function Tests: Recent Labs  03/11/16 2010  AST 35  ALT 31  ALKPHOS 60  BILITOT 0.5  PROT 7.2  ALBUMIN 3.7   Cardiac Enzymes: Recent Labs  03/12/16 0114 03/12/16 0646 03/12/16 1144  TROPONINI 1.38* 0.98* 0.73*   Fasting Lipid Panel: Recent Labs  03/12/16 0114  CHOL 128  HDL 20*  LDLCALC 66  TRIG 210*  CHOLHDL 6.4   Thyroid Function Tests: Recent Labs  03/11/16 2120  TSH 2.588   TELE: SR, ST, no sig ectopy       Radiology/Studies: Nm Myocar Multi W/spect W/Passon Motion / Ef 03/13/2016  CLINICAL DATA:  65 year old female with supraventricular tachycardia and elevated troponin. EXAM: MYOCARDIAL IMAGING WITH SPECT (REST AND PHARMACOLOGIC-STRESS) GATED LEFT VENTRICULAR Akhtar MOTION STUDY LEFT VENTRICULAR EJECTION FRACTION TECHNIQUE: Standard myocardial SPECT imaging was performed after resting intravenous injection of 10 mCi Tc-52m tetrofosmin. Subsequently, intravenous infusion of Lexiscan was performed under the supervision of the Cardiology staff. At peak effect of the drug, 30 mCi Tc-69m tetrofosmin was injected intravenously and standard myocardial SPECT imaging was performed. Quantitative gated  imaging was also performed to evaluate left ventricular Dittrich motion, and estimate left ventricular ejection fraction. COMPARISON:  None. FINDINGS: Stress EKG:  No ST changes. In Perfusion: There is a moderate region of significant decreases counts within the mid and apical segment segments of the inferolateral Goller which improves from stress to rest. Normal uptake on rest. Somers Motion: Septal hypokinesia.  No ventricular dilatation. Left Ventricular Ejection Fraction: 61 % End diastolic volume 59 ml End systolic volume 23 ml IMPRESSION: 1. Moderate region of reversibility within the mid and apical segments of the inferolateral Courts is concerning for stress-induced ischemia. 2. Mild septal hypokinesia. 3. Left ventricular  ejection fraction 61% 4. Non invasive risk stratification*: Intermediate (moderate perfusion defect) *2012 Appropriate Use Criteria for Coronary Revascularization Focused Update: J Am Coll Cardiol. [id number]. http://content.airportbarriers.com.aspx?articleid=1201161 These results will be called to the ordering clinician or representative by the Radiologist Assistant, and communication documented in the PACS or zVision Dashboard. Electronically Signed   By: Suzy Bouchard [name]   On: 03/13/2016 14:24     Current Medications:  . aspirin EC  81 mg Oral Daily  . diltiazem  240 mg Oral Daily  . enoxaparin (LOVENOX) injection  40 mg Subcutaneous Q24H  . pantoprazole  40 mg Oral Daily  . ramipril  5 mg Oral Daily  . rosuvastatin  10 mg Oral Daily  . sodium chloride flush  3 mL Intravenous Q12H   . sodium chloride 100 mL/hr (03/12/16 0114)  . [START ON 03/15/2016] sodium chloride 3 mL/kg/hr (03/14/16 [id number])   Followed by  . [START ON 03/15/2016] sodium chloride      ASSESSMENT AND PLAN: Principal Problem:   Paroxysmal SVT (supraventricular tachycardia) (H[facility]) - no recurrence, on Cardizem CD - baseline HR ok  Active Problems:   Supraventricular tachycardia ([place]) - see above    NSTEMI (non-ST elevated myocardial infarction) ([facility]) - peak troponin 1.64 - felt 2nd SVT, but MV was abnl, with ischemia - therefore, cath indicated - Dr Fletcher Anon to do later today - continue hydration, labs were ok, Lovenox held this am  Jonetta Speak , PA-C 12:57 PM 03/14/2016

## 2016-03-14 NOTE — H&P (View-Only) (Signed)
Patient Name: Natalie Hall Date of Encounter: 03/14/2016  Principal Problem:   Paroxysmal SVT (supraventricular tachycardia) ([place]) Active Problems:   Supraventricular tachycardia ([place])   NSTEMI (non-ST elevated myocardial infarction) [facility])   Primary Cardiologist: Dr Johnsie Cancel Patient Profile: 65 yo female w/ hx SVT, HTN, GERD, HLD, was admitted 05/21 w/ SVT>>SR in ER without rx, elevated troponin>> abnl MV>>cath 05/24  SUBJECTIVE: No chest pain, no palpitations, no SOB, ready for cath  OBJECTIVE Filed Vitals:   03/13/16 1405 03/13/16 2112 03/14/16 0528 03/14/16 0946  BP: 145/63 132/62 142/79 152/76  Pulse: 87 80 88   Temp: 97.6 F (36.4 C) 98.4 F (36.9 C) 97.6 F (36.4 C)   TempSrc: Oral Oral Oral   Resp: 16 16 16    Height:      Weight:   213 lb 11.2 oz (96.934 kg)   SpO2: 100% 99% 97%     Intake/Output Summary (Last 24 hours) at 03/14/16 1257 Last data filed at 03/13/16 1300  Gross per 24 hour  Intake    360 ml  Output      0 ml  Net    360 ml   Filed Weights   03/12/16 0020 03/14/16 0528  Weight: 213 lb 4.8 oz (96.752 kg) 213 lb 11.2 oz (96.934 kg)    PHYSICAL EXAM General: Well developed, well nourished, female in no acute distress. Head: Normocephalic, atraumatic.  Neck: Supple without bruits, JVD not elevated. Lungs:  Resp regular and unlabored, CTA. Heart: RRR, S1, S2, no S3, S4, or murmur; no rub. Abdomen: Soft, non-tender, non-distended, BS + x 4.  Extremities: No clubbing, cyanosis, edema.  Neuro: Alert and oriented X 3. Moves all extremities spontaneously. Psych: Normal affect.  LABS: CBC: Recent Labs  03/11/16 2010 03/12/16 0114  WBC 5.6 5.2  NEUTROABS 2.3  --   HGB 13.4 12.1  HCT 40.0 37.7  MCV 79.5 80.0  PLT 237 210   INR: Recent Labs  03/11/16 2010  INR 99991111   Basic Metabolic Panel: Recent Labs  03/11/16 2010 03/12/16 0114  NA 134*  --   K 3.9  --   CL 103  --   CO2 22  --   GLUCOSE 128*  --   BUN 18  --     CREATININE 0.61 0.54  CALCIUM 8.9  --   MG 1.9  --    Liver Function Tests: Recent Labs  03/11/16 2010  AST 35  ALT 31  ALKPHOS 60  BILITOT 0.5  PROT 7.2  ALBUMIN 3.7   Cardiac Enzymes: Recent Labs  03/12/16 0114 03/12/16 0646 03/12/16 1144  TROPONINI 1.38* 0.98* 0.73*   Fasting Lipid Panel: Recent Labs  03/12/16 0114  CHOL 128  HDL 20*  LDLCALC 66  TRIG 210*  CHOLHDL 6.4   Thyroid Function Tests: Recent Labs  03/11/16 2120  TSH 2.588   TELE: SR, ST, no sig ectopy       Radiology/Studies: Nm Myocar Multi W/spect W/Rayo Motion / Ef 03/13/2016  CLINICAL DATA:  65 year old female with supraventricular tachycardia and elevated troponin. EXAM: MYOCARDIAL IMAGING WITH SPECT (REST AND PHARMACOLOGIC-STRESS) GATED LEFT VENTRICULAR Coakley MOTION STUDY LEFT VENTRICULAR EJECTION FRACTION TECHNIQUE: Standard myocardial SPECT imaging was performed after resting intravenous injection of 10 mCi Tc-59m tetrofosmin. Subsequently, intravenous infusion of Lexiscan was performed under the supervision of the Cardiology staff. At peak effect of the drug, 30 mCi Tc-76m tetrofosmin was injected intravenously and standard myocardial SPECT imaging was performed. Quantitative gated  imaging was also performed to evaluate left ventricular Crislip motion, and estimate left ventricular ejection fraction. COMPARISON:  None. FINDINGS: Stress EKG:  No ST changes. In Perfusion: There is a moderate region of significant decreases counts within the mid and apical segment segments of the inferolateral Boull[place] which improves from stress to rest. Normal uptake on rest. Tollett Motion: Septal hypokinesia.  No ventricular dilatation. Left Ventricular Ejection Fraction: 61 % End diastolic volume 59 ml End systolic volume 23 ml IMPRESSION: 1. Moderate region of reversibility within the mid and apical segments of the inferolateral Pack is concerning for stress-induced ischemia. 2. Mild septal hypokinesia. 3. Left ventricular  ejection fraction 61% 4. Non invasive risk stratification*: Intermediate (moderate perfusion defect) *2012 Appropriate Use Criteria for Coronary Revascularization Focused Update: J Am Coll Cardiol. [id number]. http://content.airportbarriers.com.aspx?articleid=1201161 These results will be called to the ordering clinician or representative by the Radiologist Assistant, and communication documented in the PACS or zVision Dashboard. Electronically Signed   By: Suzy Bouchard [name]   On: 03/13/2016 14:24     Current Medications:  . aspirin EC  81 mg Oral Daily  . diltiazem  240 mg Oral Daily  . enoxaparin (LOVENOX) injection  40 mg Subcutaneous Q24H  . pantoprazole  40 mg Oral Daily  . ramipril  5 mg Oral Daily  . rosuvastatin  10 mg Oral Daily  . sodium chloride flush  3 mL Intravenous Q12H   . sodium chloride 100 mL/hr (03/12/16 0114)  . [START ON 03/15/2016] sodium chloride 3 mL/kg/hr (03/14/16 [id number])   Followed by  . [START ON 03/15/2016] sodium chloride      ASSESSMENT AND PLAN: Principal Problem:   Paroxysmal SVT (supraventricular tachycardia) (HCC) - no recurrence, on Cardizem CD - baseline HR ok  Active Problems:   Supraventricular tachycardia ([place]) - see above    NSTEMI (non-ST elevated myocardial infarction) (HCC) - peak troponin 1.64 - felt 2nd SVT, but MV was abnl, with ischemia - therefore, cath indicated - Dr Fletcher Anon to do later today - continue hydration, labs were ok, Lovenox held this am  Jonetta Speak , PA-C 12:57 PM 03/14/2016

## 2016-03-15 ENCOUNTER — Encounter (HOSPITAL_COMMUNITY): Payer: Self-pay | Admitting: Cardiology

## 2016-03-15 ENCOUNTER — Encounter: Payer: Self-pay | Admitting: *Deleted

## 2016-03-15 DIAGNOSIS — Z006 Encounter for examination for normal comparison and control in clinical research program: Secondary | ICD-10-CM

## 2016-03-15 DIAGNOSIS — Z955 Presence of coronary angioplasty implant and graft: Secondary | ICD-10-CM

## 2016-03-15 LAB — BASIC METABOLIC PANEL
Anion gap: 8 (ref 5–15)
BUN: 8 mg/dL (ref 6–20)
CHLORIDE: 106 mmol/L (ref 101–111)
CO2: 22 mmol/L (ref 22–32)
Calcium: 8.8 mg/dL — ABNORMAL LOW (ref 8.9–10.3)
Creatinine, Ser: 0.55 mg/dL (ref 0.44–1.00)
GFR calc Af Amer: >60 mL/min (ref >60)
GFR calc non Af Amer: >60 mL/min (ref >60)
GLUCOSE: 103 mg/dL — AB (ref 65–99)
POTASSIUM: 3.7 mmol/L (ref 3.5–5.1)
Sodium: 136 mmol/L (ref 135–145)

## 2016-03-15 LAB — CBC
HEMATOCRIT: 38.2 % (ref 36.0–46.0)
Hemoglobin: 12 g/dL (ref 12.0–15.0)
MCH: 25.8 pg — AB (ref 26.0–34.0)
MCHC: 31.4 g/dL (ref 30.0–36.0)
MCV: 82.2 fL (ref 78.0–100.0)
Platelets: 271 10*3/uL (ref 150–400)
RBC: 4.65 MIL/uL (ref 3.87–5.11)
RDW: 14.8 % (ref 11.5–15.5)
WBC: 6.4 10*3/uL (ref 4.0–10.5)

## 2016-03-15 MED ORDER — ASPIRIN 81 MG PO TBEC: 81.0000 mg | DELAYED_RELEASE_TABLET | Freq: Every day | ORAL | Status: DC

## 2016-03-15 MED ORDER — TICAGRELOR 90 MG PO TABS: 90.0000 mg | ORAL_TABLET | Freq: Two times a day (BID) | ORAL | Status: DC

## 2016-03-15 MED ORDER — METOPROLOL SUCCINATE ER 25 MG PO TB24: 25.0000 mg | ORAL_TABLET | Freq: Every day | ORAL | Status: DC

## 2016-03-15 MED ORDER — METOPROLOL SUCCINATE ER 25 MG PO TB24
25.0000 mg | ORAL_TABLET | Freq: Every day | ORAL | Status: DC
  Administered 2016-03-15: 25 mg via ORAL
  Filled 2016-03-15: qty 1

## 2016-03-15 MED ORDER — RAMIPRIL 10 MG PO CAPS: 10.0000 mg | ORAL_CAPSULE | Freq: Every day | ORAL | Status: DC

## 2016-03-15 MED ORDER — RAMIPRIL 10 MG PO CAPS
10.0000 mg | ORAL_CAPSULE | Freq: Every day | ORAL | Status: DC
  Administered 2016-03-15: 10 mg via ORAL
  Filled 2016-03-15: qty 1

## 2016-03-15 MED ORDER — NITROGLYCERIN 0.4 MG SL SUBL: 0.4000 mg | SUBLINGUAL_TABLET | SUBLINGUAL | Status: DC | PRN

## 2016-03-15 MED FILL — Verapamil HCl IV Soln 2.5 MG/ML: INTRAVENOUS | Qty: 2 | Status: AC

## 2016-03-15 NOTE — Progress Notes (Signed)
Twilight Study Informed Consent   Subject Name: Natalie Hall  Subject met inclusion and exclusion criteria. The informed consent form, study requirements and expectations were reviewed with the subject and questions and concerns were addressed prior to the signing of the consent form. The subject verbalized understanding of the trail requirements. The subject agreed to participate in the Twilght trial and signed the informed consent. The informed consent was obtained prior to performance of any protocol-specific procedures for the subject. A copy of the signed informed consent was given to the subject and a copy was placed in the subject's medical record.  Jake Bathe, RN 03/15/2016, 1125 am

## 2016-03-15 NOTE — Progress Notes (Signed)
CARDIAC REHAB PHASE I   PRE:  Rate/Rhythm: 93 SR    BP: sitting 166/72    SaO2:   MODE:  Ambulation: 400 ft   POST:  Rate/Rhythm: 105 ST    BP: sitting 195/71     SaO2:   Pt slow and slightly wobbly due to orthopedic issues (also no shoes). Denied CP/SOB. BP elevated. Ed completed with pt, voicing understanding. Will send referral to [place], ACSM 03/15/2016 8:35 AM

## 2016-03-15 NOTE — Discharge Summary (Signed)
Discharge Summary    Patient ID: Natalie Hall,  MRN: [id number], DOB/AGE: 66/25/1952 66 y.o.  Admit date: 03/11/2016 Discharge date: 03/15/2016  Primary Care Provider: FRY,STEPHEN A Primary Cardiologist: Dr Debara Pickett  Discharge Diagnoses    Principal Problem:   Paroxysmal SVT (supraventricular tachycardia) ([place]) Active Problems:   NSTEMI (non-ST elevated myocardial infarction) ([place])   S/P coronary artery stent placement   Allergies Allergies  Allergen Reactions  . Codeine Other (See Comments)    flushing  . Levaquin [Levofloxacin In D5w]     Leg pain   . Augmentin [Amoxicillin-Pot Clavulanate] Rash  . Lexapro [Escitalopram Oxalate] Rash    Diagnostic Studies/Procedures    Echo 05/22 - Left ventricle: The cavity size was normal. [place] thickness was  normal. Systolic function was vigorous. The estimated ejection  fraction was in the range of 65% to 70%. Doppler parameters are  consistent with abnormal left ventricular relaxation (grade 1  diastolic dysfunction).  Lexiscan Myoview 05/23, results below  Cardiac Cath 05/24   Prox RCA lesion, 25% stenosed.  The left ventricular systolic function is normal.  Prox LAD to Mid LAD lesion, 15% stenosed.  Mid RCA-1 lesion, 95% stenosed.  Mid RCA-2 lesion, 95% stenosed. Post intervention, there is a 0% residual stenosis.SYNERGY DES 3X32  1. Single vessel obstructive CAD 2. Normal LV function 3. Successful stenting of the mid to distal RCA with a DES, SYNERGY DES 3X32 Plan: DAPT for one year. Patient may be a candidate for the TWILIGHT trial in which case study protocol will be followed. Anticipate DC in am.  _____________   History of Present Illness     65 yo female w/ hx SVT, HTN, GERD, HLD, was admitted 05/21 w/ more than 8 hours of SVT, spontaneous conversion to SR in ER without rx.  Hospital Course    The SVT converted spontaneously to SR in the ER and did not return. Her Cardizem was increased from 120  mg qd up to 240 mg qd.   She never had chest pain. However, her cardiac enzymes were cycled and were elevated, peak 1.64. A myoview was performed, results below. It was abnormal, so a cardiac cath was scheduled.   The cath results are above. She had significant RCA disease, treated with a DES, reducing the stenosis to zero. She tolerated the procedure well.   On 05/25, she was seen by Dr Debara Pickett and all data were reviewed. She was seen by cardiac rehab and ambulated with them. She was educated on MI restrictions, heart-healthy lifestyle modifications and exercise guidelines. She was referred to outpatient rehab.  Dr Debara Pickett evaluated Ms Brophy and no further inpatient workup was indicated. She is considered stable for discharge, to follow up as an outpatient. _____________  Discharge Vitals Blood pressure 166/72, pulse 91, temperature 97.7 F (36.5 C), temperature source Oral, resp. rate 21, height 5\' 4"  (1.626 m), weight 213 lb 6.5 oz (96.8 kg), SpO2 99 %.  Filed Weights   03/12/16 0020 03/14/16 0528 03/15/16 0429  Weight: 213 lb 4.8 oz (96.752 kg) 213 lb 11.2 oz (96.934 kg) 213 lb 6.5 oz (96.8 kg)    Labs & Radiologic Studies    CBC  Recent Labs  03/15/16 0456  WBC 6.4  HGB 12.0  HCT 38.2  MCV 82.2  PLT 99991111   Basic Metabolic Panel  Recent Labs  03/15/16 0456  NA 136  K 3.7  CL 106  CO2 22  GLUCOSE 103*  BUN 8  CREATININE 0.55  CALCIUM 8.8*   Liver Function Tests Lab Results  Component Value Date   ALT 31 03/11/2016   AST 35 03/11/2016   ALKPHOS 60 03/11/2016   BILITOT 0.5 03/11/2016   Cardiac Enzymes  Recent Labs  03/12/16 1144  TROPONINI 0.73*   Fasting Lipid Panel Lab Results  Component Value Date   CHOL 128 03/12/2016   HDL 20* 03/12/2016   LDLCALC 66 03/12/2016   LDLDIRECT 125.1 01/22/2013   TRIG 210* 03/12/2016   CHOLHDL 6.4 03/12/2016  _____________  Nm Myocar Multi W/spect W/Branam Motion / Ef 03/13/2016  CLINICAL DATA:  65 year old female with  supraventricular tachycardia and elevated troponin. EXAM: MYOCARDIAL IMAGING WITH SPECT (REST AND PHARMACOLOGIC-STRESS) GATED LEFT VENTRICULAR Naef MOTION STUDY LEFT VENTRICULAR EJECTION FRACTION TECHNIQUE: Standard myocardial SPECT imaging was performed after resting intravenous injection of 10 mCi Tc-8m tetrofosmin. Subsequently, intravenous infusion of Lexiscan was performed under the supervision of the Cardiology staff. At peak effect of the drug, 30 mCi Tc-55m tetrofosmin was injected intravenously and standard myocardial SPECT imaging was performed. Quantitative gated imaging was also performed to evaluate left ventricular Durall motion, and estimate left ventricular ejection fraction. COMPARISON:  None. FINDINGS: Stress EKG:  No ST changes. In Perfusion: There is a moderate region of significant decreases counts within the mid and apical segment segments of the inferolateral Jumper which improves from stress to rest. Normal uptake on rest. [place]ill Motion: Septal hypokinesia.  No ventricular dilatation. Left Ventricular Ejection Fraction: 61 % End diastolic volume 59 ml End systolic volume 23 ml IMPRESSION: 1. Moderate region of reversibility within the mid and apical segments of the inferolateral Petersen is concerning for stress-induced ischemia. 2. Mild septal hypokinesia. 3. Left ventricular ejection fraction 61% 4. Non invasive risk stratification*: Intermediate (moderate perfusion defect) *2012 Appropriate Use Criteria for Coronary Revascularization Focused Update: J Am Coll Cardiol. [id number]. http://content.airportbarriers.com.aspx?articleid=1201161 These results will be called to the ordering clinician or representative by the Radiologist Assistant, and communication documented in the PACS or zVision Dashboard. Electronically Signed   By: Suzy Bouchard [name]   On: 03/13/2016 14:24   Disposition   Pt is being discharged home today in good condition.  Follow-up Plans & Appointments     Follow-up Information    Follow up with Pixie Casino, MD.   Specialty:  Cardiology   Why:  The office will call.   Contact information:   [address] [place] [address] [phone]      Discharge Instructions    AMB Referral to Cardiac Rehabilitation - Phase II    Complete by:  As directed   Diagnosis:  NSTEMI     Amb Referral to Cardiac Rehabilitation    Complete by:  As directed   Diagnosis:   Coronary Stents PTCA NSTEMI       Diet - low sodium heart healthy    Complete by:  As directed      Increase activity slowly    Complete by:  As directed            Discharge Medications   Current Discharge Medication List    START taking these medications   Details  aspirin EC 81 MG EC tablet Take 1 tablet (81 mg total) by mouth daily.    metoprolol succinate (TOPROL-XL) 25 MG 24 hr tablet Take 1 tablet (25 mg total) by mouth daily. Qty: 30 tablet, Refills: 11    nitroGLYCERIN (NITROSTAT) 0.4 MG SL tablet Place 1 tablet (0.4 mg  total) under the tongue every 5 (five) minutes x 3 doses as needed for chest pain. Qty: 25 tablet, Refills: 3    ticagrelor (BRILINTA) 90 MG TABS tablet Take 1 tablet (90 mg total) by mouth 2 (two) times daily. Qty: 60 tablet, Refills: 11      CONTINUE these medications which have CHANGED   Details  ramipril (ALTACE) 10 MG capsule Take 1 capsule (10 mg total) by mouth daily. Qty: 30 capsule, Refills: 11      CONTINUE these medications which have NOT CHANGED   Details  albuterol (PROVENTIL HFA;VENTOLIN HFA) 108 (90 BASE) MCG/ACT inhaler Inhale 2 puffs into the lungs every 4 (four) hours as needed for wheezing or shortness of breath. Qty: 1 Inhaler, Refills: 11    CALCIUM PO Take 1 tablet by mouth daily.     Cholecalciferol (VITAMIN D-3 PO) Take 1 tablet by mouth daily.     loratadine (CLARITIN) 10 MG tablet Take 10 mg by mouth daily.    Multiple Vitamins-Minerals (MULTIVITAMIN PO) Take 1 tablet by mouth daily.      omeprazole (PRILOSEC) 40 MG capsule Take 1 capsule by mouth  daily Qty: 90 capsule, Refills: 0    rosuvastatin (CRESTOR) 10 MG tablet Take 1 tablet by mouth  daily Qty: 90 tablet, Refills: 0    triamcinolone (NASACORT) 55 MCG/ACT nasal inhaler Place 2 sprays into the nose daily. Qty: 3 Inhaler, Refills: 3      STOP taking these medications     diltiazem (CARDIZEM CD) 120 MG 24 hr capsule          Aspirin prescribed at discharge?  Yes High Intensity Statin Prescribed? (Lipitor 40-80mg  or Crestor 20-40mg ): No Beta Blocker Prescribed? Yes For EF <40%, was ACEI/ARB Prescribed? No: n/a ADP Receptor Inhibitor Prescribed? (i.e. Plavix etc.-Includes Medically Managed Patients): Yes For EF <40%, Aldosterone Inhibitor Prescribed? No: n/a Was EF assessed during THIS hospitalization? Yes Was Cardiac Rehab II ordered? (Included Medically managed Patients): Yes   Outstanding Labs/Studies   none  Duration of Discharge Encounter   Greater than 30 minutes including physician time.  Jonetta Speak NP 03/15/2016, 10:04 AM

## 2016-03-15 NOTE — Care Management Note (Signed)
Case Management Note  Patient Details  Name: Natalie Hall MRN: [id number] Date of Birth: [birth year]  Subjective/Objective:     Patient is from home, pta indep. She will be participating in [place] study for [facility].  Per Benefit check copay for Brilinta is $50 and no prior auth needed.                 Action/Plan:   Expected Discharge Date:                  Expected Discharge Plan:  Home/Self Care  In-House Referral:     Discharge planning Services  CM Consult  Post Acute Care Choice:    Choice offered to:     DME Arranged:    DME Agency:     HH Arranged:    [place] Agency:     Status of Service:  Completed, signed off  Medicare Important Message Given:    Date Medicare IM Given:    Medicare IM give by:    Date Additional Medicare IM Given:    Additional Medicare Important Message give by:     If discussed at [place] of Stay Meetings, dates discussed:    Additional Comments:  Zenon Mayo, RN 03/15/2016, 10:37 AM

## 2016-03-15 NOTE — Progress Notes (Signed)
TR BAND REMOVAL  LOCATION:    right radial  DEFLATED PER PROTOCOL:    Yes.    TIME BAND OFF / DRESSING APPLIED:    20:30   SITE UPON ARRIVAL:    Level 0  SITE AFTER BAND REMOVAL:    Level 0  CIRCULATION SENSATION AND MOVEMENT:    Within Normal Limits   Yes.    COMMENTS:   Post TR band instructions given. Pt tolerated well. 

## 2016-03-15 NOTE — Discharge Instructions (Signed)

## 2016-03-15 NOTE — Progress Notes (Signed)
DAILY PROGRESS NOTE  Subjective:  Feels well today. S/p PCI to the mid-distal RCA yesterday for abnormal nuclear stress test, found to have 95% stenosis. No SVT overnight. BP elevated in 665'L-935'T systolic.  Objective:  Temp:  [97.7 F (36.5 C)-97.9 F (36.6 C)] 97.7 F (36.5 C) (05/25 0801) Pulse Rate:  [0-91] 91 (05/25 0801) Resp:  [0-29] 21 (05/25 0801) BP: (126-166)/(57-121) 166/72 mmHg (05/25 0801) SpO2:  [0 %-100 %] 99 % (05/25 0801) Weight:  [213 lb 6.5 oz (96.8 kg)] 213 lb 6.5 oz (96.8 kg) (05/25 0429) Weight change: -4.7 oz (-0.134 kg)  Intake/Output from previous day: 05/24 0701 - 05/25 0700 In: 1257.5 [P.O.:240; I.V.:1017.5] Out: 400 [Urine:400]  Intake/Output from this shift:    Medications: Current Facility-Administered Medications  Medication Dose Route Frequency Provider Last Rate Last Dose  . 0.9 %  sodium chloride infusion  250 mL Intravenous PRN Peter M Martinique, MD      . acetaminophen (TYLENOL) tablet 650 mg  650 mg Oral Q4H PRN Jacolyn Reedy, MD      . aspirin EC tablet 81 mg  81 mg Oral Daily Jacolyn Reedy, MD   81 mg at 03/14/16 0946  . metoprolol succinate (TOPROL-XL) 24 hr tablet 25 mg  25 mg Oral Daily Pixie Casino, MD      . nitroGLYCERIN (NITROSTAT) SL tablet 0.4 mg  0.4 mg Sublingual Q5 Min x 3 PRN Jacolyn Reedy, MD      . ondansetron [facility]) injection 4 mg  4 mg Intravenous Q6H PRN Jacolyn Reedy, MD      . pantoprazole (PROTONIX) EC tablet 40 mg  40 mg Oral Daily Jacolyn Reedy, MD   40 mg at 03/14/16 0946  . ramipril (ALTACE) capsule 10 mg  10 mg Oral Daily Pixie Casino, MD      . rosuvastatin (CRESTOR) tablet 10 mg  10 mg Oral Daily Jacolyn Reedy, MD   10 mg at 03/14/16 0946  . sodium chloride flush (NS) 0.9 % injection 3 mL  3 mL Intravenous Q12H Peter M Martinique, MD   3 mL at 03/14/16 2030  . sodium chloride flush (NS) 0.9 % injection 3 mL  3 mL Intravenous PRN Peter M Martinique, MD      . ticagrelor [facility]) tablet  90 mg  90 mg Oral BID Peter M Martinique, MD   90 mg at 03/15/16 0400    Physical Exam: General appearance: alert and no distress Lungs: clear to auscultation bilaterally Heart: regular rate and rhythm, S1, S2 normal, no murmur, click, rub or gallop Extremities: extremities normal, atraumatic, no cyanosis or edema and cath site without eccyhmosis, bruit or hematoma Neurologic: Grossly normal  Lab Results: Results for orders placed or performed during the hospital encounter of 03/11/16 (from the past 48 hour(s))  POCT Activated clotting time     Status: None   Collection Time: 03/14/16  3:57 PM  Result Value Ref Range   Activated Clotting Time 544 seconds  Basic metabolic panel     Status: Abnormal   Collection Time: 03/15/16  4:56 AM  Result Value Ref Range   Sodium 136 135 - 145 mmol/L   Potassium 3.7 3.5 - 5.1 mmol/L   Chloride 106 101 - 111 mmol/L   CO2 22 22 - 32 mmol/L   Glucose, Bld 103 (H) 65 - 99 mg/dL   BUN 8 6 - 20 mg/dL   Creatinine, Ser 0.55 0.44 - 1.00  mg/dL   Calcium 8.8 (L) 8.9 - 10.3 mg/dL   GFR calc non Af Amer >60 >60 mL/min   GFR calc Af Amer >60 >60 mL/min    Comment: (NOTE) The eGFR has been calculated using the CKD EPI equation. This calculation has not been validated in all clinical situations. eGFR's persistently <60 mL/min signify possible Chronic Kidney Disease.    Anion gap 8 5 - 15  CBC     Status: Abnormal   Collection Time: 03/15/16  4:56 AM  Result Value Ref Range   WBC 6.4 4.0 - 10.5 K/uL   RBC 4.65 3.87 - 5.11 MIL/uL   Hemoglobin 12.0 12.0 - 15.0 g/dL   HCT 38.2 36.0 - 46.0 %   MCV 82.2 78.0 - 100.0 fL   MCH 25.8 (L) 26.0 - 34.0 pg   MCHC 31.4 30.0 - 36.0 g/dL   RDW 14.8 11.5 - 15.5 %   Platelets 271 150 - 400 K/uL    Imaging: Nm Myocar Multi W/spect W/Puccini Motion / Ef  03/13/2016  CLINICAL DATA:  65 year old female with supraventricular tachycardia and elevated troponin. EXAM: MYOCARDIAL IMAGING WITH SPECT (REST AND  PHARMACOLOGIC-STRESS) GATED LEFT VENTRICULAR Prezioso MOTION STUDY LEFT VENTRICULAR EJECTION FRACTION TECHNIQUE: Standard myocardial SPECT imaging was performed after resting intravenous injection of 10 mCi Tc-36mtetrofosmin. Subsequently, intravenous infusion of Lexiscan was performed under the supervision of the Cardiology staff. At peak effect of the drug, 30 mCi Tc-914metrofosmin was injected intravenously and standard myocardial SPECT imaging was performed. Quantitative gated imaging was also performed to evaluate left ventricular Bungert motion, and estimate left ventricular ejection fraction. COMPARISON:  None. FINDINGS: Stress EKG:  No ST changes. In Perfusion: There is a moderate region of significant decreases counts within the mid and apical segment segments of the inferolateral Carlucci which improves from stress to rest. Normal uptake on rest. Schindler Motion: Septal hypokinesia.  No ventricular dilatation. Left Ventricular Ejection Fraction: 61 % End diastolic volume 59 ml End systolic volume 23 ml IMPRESSION: 1. Moderate region of reversibility within the mid and apical segments of the inferolateral [place] is concerning for stress-induced ischemia. 2. Mild septal hypokinesia. 3. Left ventricular ejection fraction 61% 4. Non invasive risk stratification*: Intermediate (moderate perfusion defect) *2012 Appropriate Use Criteria for Coronary Revascularization Focused Update: J Am Coll Cardiol. 205361;44(3):154-008http://content.onairportbarriers.comspx?articleid=1201161 These results will be called to the ordering clinician or representative by the Radiologist Assistant, and communication documented in the PACS or zVision Dashboard. Electronically Signed   By: StSuzy Bouchard.D.   On: 03/13/2016 14:24    Assessment:  Principal Problem:   Paroxysmal SVT (supraventricular tachycardia) (HCC) Active Problems:   NSTEMI (non-ST elevated myocardial infarction) ([place]  S/P coronary artery stent placement    Plan:  1. S/p PCI to the mid-distal RCA yesterday for 95% stenosis and abnormal nuclear stress test in the setting of NSTEMI. Feels great today and wants to go home. No cath site complications. BP elevated - would prefer to have her on b-blocker rather than diltiazem. Change to Toprol XL 25 mg daily. Increase ramipril to 10 mg daily. On Crestor 10 mg prior to admission - LDL is 66. Labs normal this morning. [place] home today. Follow-up with me after discharge per her request.  Time Spent Directly with Patient:  15 minutes  Length of Stay:  LOS: 1 day  KePixie CasinoMD, FAEndoscopy Center Of Coastal Georgia LLCttending Cardiologist [place]/25/2017, 8:20 AM

## 2016-03-22 ENCOUNTER — Ambulatory Visit (INDEPENDENT_AMBULATORY_CARE_PROVIDER_SITE_OTHER): Payer: Medicare Other | Admitting: Internal Medicine

## 2016-03-22 ENCOUNTER — Encounter: Payer: Self-pay | Admitting: Internal Medicine

## 2016-03-22 VITALS — BP 134/78 | HR 81 | Ht 64.0 in | Wt 210.0 lb

## 2016-03-22 DIAGNOSIS — I10 Essential (primary) hypertension: Secondary | ICD-10-CM

## 2016-03-22 DIAGNOSIS — E785 Hyperlipidemia, unspecified: Secondary | ICD-10-CM

## 2016-03-22 DIAGNOSIS — Z955 Presence of coronary angioplasty implant and graft: Secondary | ICD-10-CM

## 2016-03-22 DIAGNOSIS — Z006 Encounter for examination for normal comparison and control in clinical research program: Secondary | ICD-10-CM | POA: Diagnosis not present

## 2016-03-22 DIAGNOSIS — I471 Supraventricular tachycardia: Secondary | ICD-10-CM

## 2016-03-22 MED ORDER — METOPROLOL SUCCINATE ER 25 MG PO TB24: 25.0000 mg | ORAL_TABLET | Freq: Every day | ORAL | Status: DC

## 2016-03-22 NOTE — Patient Instructions (Signed)
Your physician wants you to follow-up in: 6 months with Dr. Hilty. You will receive a reminder letter in the mail two months in advance. If you don't receive a letter, please call our office to schedule the follow-up appointment.    

## 2016-03-22 NOTE — Progress Notes (Signed)
OFFICE NOTE  Chief Complaint:  Hospital follow-up  Primary Care Physician: Laurey Morale, MD  HPI:  Natalie Hall is a 65 y.o. female who I last saw in the hospital a few weeks ago. She was admitted for an SVT which spontaneously converted in the ER on Cardizem and did not return. Unfortunately she had a non-ST elevation MI with troponin peak of 1.64. She underwent a Lexiscan Myoview which demonstrated a moderate region of reversibility in the mid and apical segments of the inferior lateral [place] consistent with ischemia. EF was 61%. She then was referred to cardiac catheterization which demonstrated the following:  Cardiac Cath 05/24   Prox RCA lesion, 25% stenosed.  The left ventricular systolic function is normal.  Prox LAD to Mid LAD lesion, 15% stenosed.  Mid RCA-1 lesion, 95% stenosed.  Mid RCA-2 lesion, 95% stenosed. Post intervention, there is a 0% residual stenosis.SYNERGY DES 3X32 1. Single vessel obstructive CAD 2. Normal LV function 3. Successful stenting of the mid to distal RCA with a DES, SYNERGY DES 3X32 Plan: DAPT for one year. Patient may be a candidate for the TWILIGHT trial in which case study protocol will be followed. Anticipate DC in am.  Natalie Hall returns today and is doing quite well. She denies any chest pain or worsening shortness of breath. She is compliant with her medications. She did enroll in the TWILIGHT trial. Unfortunately, she was considering shoulder surgery but this will be delayed due to the necessity of dual antiplatelet therapy with her recent stent. I advised her the Tylenol or her tramadol would be safe medicines used for pain.  PMHx:  Past Medical History  Diagnosis Date  . Osteoarthritis   . Hypertension   . Hx of colonic polyp   . GERD (gastroesophageal reflux disease)   . Hyperlipidemia   . Asthma     infrequent problem  . Choroidal nevus of left eye     sees Dr. Gerarda Fraction at [facility].   . Supraventricular  tachycardia [facility])     Past Surgical History  Procedure Laterality Date  . Breast biopsy    . Carpal tunnel release    . Cholecystectomy    . Abdominal hysterectomy    . Varicose vein surgery    . Colonoscopy  06-23-14    per Dr. Ardis Hughs, adenomatous polyps, repeat in 5 yrs    . Polypectomy    . Ankle surgery      torn tendon left ankle  . Achilles tendon repair  10-07-11    torn on right, per Dr. Noemi Chapel   . Total knee arthroplasty Left 12/21/2013    Procedure: LEFT TOTAL KNEE ARTHROPLASTY;  Surgeon: Gearlean Alf, MD;  Location: [facility]S;  Service: Orthopedics;  Laterality: Left;  . Knee closed reduction Left 02/03/2014    Procedure: CLOSED MANIPULATION LEFT KNEE;  Surgeon: Gearlean Alf, MD;  Location: [facility];  Service: Orthopedics;  Laterality: Left;  . Cardiac catheterization N/A 03/14/2016    Procedure: Left Heart Cath and Coronary Angiography;  Surgeon: Peter M Martinique, MD;  Location: [place] [facility] LAB;  Service: Cardiovascular;  Laterality: N/A;  . Cardiac catheterization N/A 03/14/2016    Procedure: Coronary Stent Intervention;  Surgeon: Peter M Martinique, MD;  Location: [place] [facility];  Service: Cardiovascular;  Laterality: N/A;    FAMHx:  Family History  Problem Relation Age of Onset  . Breast cancer    . Coronary artery disease    .  Colon cancer    . Diabetes    . Hypertension    . Kidney disease    . Lung cancer    . Colon polyps Mother   . Colon cancer Maternal Aunt   . Liver cancer Maternal Aunt   . Lung cancer Brother     spleen, bone  . Esophageal cancer Neg Hx   . Stomach cancer Neg Hx   . Rectal cancer Neg Hx     SOCHx:   reports that she has never smoked. She has never used smokeless tobacco. She reports that she does not drink alcohol or use illicit drugs.  ALLERGIES:  Allergies  Allergen Reactions  . Codeine Other (See Comments)    flushing  . Levaquin [Levofloxacin In D5w]     Leg pain   . Augmentin [Amoxicillin-Pot Clavulanate] Rash  .  Lexapro [Escitalopram Oxalate] Rash    ROS: Pertinent items noted in HPI and remainder of comprehensive ROS otherwise negative.  HOME MEDS: Current Outpatient Prescriptions  Medication Sig Dispense Refill  . albuterol (PROVENTIL HFA;VENTOLIN HFA) 108 (90 BASE) MCG/ACT inhaler Inhale 2 puffs into the lungs every 4 (four) hours as needed for wheezing or shortness of breath. 1 Inhaler 11  . aspirin EC 81 MG EC tablet Take 1 tablet (81 mg total) by mouth daily.    Marland Kitchen CALCIUM PO Take 1 tablet by mouth daily.     . Cholecalciferol (VITAMIN D-3 PO) Take 1 tablet by mouth daily.     Marland Kitchen loratadine (CLARITIN) 10 MG tablet Take 10 mg by mouth daily.    . metoprolol succinate (TOPROL-XL) 25 MG 24 hr tablet Take 1 tablet (25 mg total) by mouth daily. 30 tablet 11  . Multiple Vitamins-Minerals (MULTIVITAMIN PO) Take 1 tablet by mouth daily.     . nitroGLYCERIN (NITROSTAT) 0.4 MG SL tablet Place 1 tablet (0.4 mg total) under the tongue every 5 (five) minutes x 3 doses as needed for chest pain. 25 tablet 3  . omeprazole (PRILOSEC) 40 MG capsule Take 1 capsule by mouth  daily 90 capsule 0  . ramipril (ALTACE) 10 MG capsule Take 1 capsule (10 mg total) by mouth daily. 30 capsule 11  . rosuvastatin (CRESTOR) 10 MG tablet Take 1 tablet by mouth  daily 90 tablet 0  . ticagrelor (BRILINTA) 90 MG TABS tablet Take 1 tablet (90 mg total) by mouth 2 (two) times daily. 60 tablet 11  . triamcinolone (NASACORT) 55 MCG/ACT nasal inhaler Place 2 sprays into the nose daily. 3 Inhaler 3   No current facility-administered medications for this visit.    LABS/IMAGING: No results found for this or any previous visit (from the past 48 hour(s)). No results found.  WEIGHTS: Wt Readings from Last 3 Encounters:  03/22/16 210 lb (95.255 kg)  03/15/16 213 lb 6.5 oz (96.8 kg)  02/13/16 217 lb (98.431 kg)    VITALS: BP 134/78 mmHg  Pulse 81  Ht 5\' 4"  (1.626 m)  Wt 210 lb (95.255 kg)  BMI 36.03 kg/m2  EXAM: General  appearance: alert and no distress Neck: no carotid bruit and no JVD Lungs: clear to auscultation bilaterally Heart: regular rate and rhythm, S1, S2 normal, no murmur, click, rub or gallop Abdomen: soft, non-tender; bowel sounds normal; no masses,  no organomegaly Extremities: extremities normal, atraumatic, no cyanosis or edema Pulses: 2+ and symmetric Skin: Skin color, texture, turgor normal. No rashes or lesions Neurologic: Grossly normal Psych: Pleasant  EKG: Normal sinus rhythm and 81  ASSESSMENT: 1. Coronary artery disease status post PCI with a drug-eluting stent to the mid/distal RCA (02/2016) 2. S/p NSTEMI with abnormal nuclear stress test 3. Paroxysmal SVT 4. Dyslipidemia 5. Hypertension  PLAN: 1.   Natalie Hall is doing well after her RCA stent. This was for non-ST elevation MI. She has had no chest pain or worsening shortness of breath. She seems to be tolerating dual antiplatelet therapy. She has not had any recurrent tachyarrhythmias. She is on Crestor for dyslipidemia. We will continue her current medicines. She will be starting cardiac rehabilitation after she retires this month. Follow-up with me in 6 months.  Pixie Casino, MD, [facility] Attending Cardiologist [place] 03/22/2016, 8:14 AM

## 2016-03-27 ENCOUNTER — Other Ambulatory Visit: Payer: Self-pay | Admitting: *Deleted

## 2016-03-27 MED ORDER — AMBULATORY NON FORMULARY MEDICATION: 81.0000 mg | Freq: Every day | Status: DC

## 2016-03-27 MED ORDER — AMBULATORY NON FORMULARY MEDICATION: 90.0000 mg | Freq: Two times a day (BID) | Status: DC

## 2016-04-03 ENCOUNTER — Other Ambulatory Visit: Payer: Self-pay | Admitting: Family Medicine

## 2016-04-03 NOTE — Telephone Encounter (Signed)
Rx refill sent to pharmacy. 

## 2016-04-05 ENCOUNTER — Telehealth: Payer: Self-pay | Admitting: Internal Medicine

## 2016-04-05 NOTE — Telephone Encounter (Signed)
1. Type of surgery: left shoulder: left shoulder reverse TSA 2. Date of surgery: pending 3. Surgeon: Dr. Esmond Plants 4. Medications that need to be held & how long: none specified - patient is on Brilinta & ASA (trial) 5. Fax and/or Phone: (f) [phone]  (p) [phone]  -- Santiago Bur (surgical scheduler)   Patient had LHC & stent placed 03/14/16

## 2016-04-05 NOTE — Telephone Encounter (Signed)
Cannot have surgery that would require stopping dual-antiplatelet surgery until 03/14/2017 - 1 year after DES placement.  Dr. Lemmie Evens

## 2016-04-06 NOTE — Telephone Encounter (Signed)
Contacted patient regarding pending clearance. She states she had previously called to cancel this surgery as she is aware she cannot have surgery/stop her anti-platelet medications (trial) for 1 year post stent.   Message routed via EPIC to Dr. [name]' office

## 2016-04-09 ENCOUNTER — Telehealth: Payer: Self-pay | Admitting: Internal Medicine

## 2016-04-09 NOTE — Telephone Encounter (Signed)
New message     Patient calling C/O more rapid heart rate . Took with blood pressure cuff  151 - 7 min ago.    No chest pain  No sob

## 2016-04-09 NOTE — Telephone Encounter (Signed)
Returned call to patient. She notes hx of SVT, MI 1 month ago. She spontaneously converted out of SVT the last time. Was put on metoprolol in place of cardizem.  Called today, noted she felt her heart racing today at work. Denies syncope, dizziness, SOB, CP. States she took rate reading on husband's BP cuff when she got home, HR of 151.   She rechecked later and had gone down to 89. BP of 120/87. Further recheck while on phone, HR of 90, BP 134/73  Recommended if rapid HR returns, and BP [id number] systolic, she may take 1/2 tab of metoprolol extra to see if resolves. O/w recommended ER evaluation if she has return of rapid palpitations and these do not improve. Pt aware that tomorrow I will try to schedule her for an APP visit this week.  Discussed w/ Dr. Ellyn Hack who agreed w/ advice given.  Notes likely if SVT and self resolves, will be short lived, so metoprolol PRN may or may not be beneficial, but advised safe to take as long as BP stable.

## 2016-04-10 NOTE — Telephone Encounter (Signed)
Called at home, husband recommended I call pt's work line.  Called work [employer] operational [employer]. Spoke to patient, reaffirmed advice yesterday for PRN metoprolol. Offered appt on Thursday at [facility], which patient accepted.  Advised to call if further needs in interim. Pt acknowledged and voiced thanks.

## 2016-04-12 ENCOUNTER — Encounter: Payer: Self-pay | Admitting: Physician Assistant

## 2016-04-12 ENCOUNTER — Ambulatory Visit (INDEPENDENT_AMBULATORY_CARE_PROVIDER_SITE_OTHER): Payer: Medicare Other | Admitting: Physician Assistant

## 2016-04-12 VITALS — BP 128/78 | HR 83 | Ht 64.0 in | Wt 208.0 lb

## 2016-04-12 DIAGNOSIS — I471 Supraventricular tachycardia: Secondary | ICD-10-CM

## 2016-04-12 DIAGNOSIS — I2510 Atherosclerotic heart disease of native coronary artery without angina pectoris: Secondary | ICD-10-CM

## 2016-04-12 DIAGNOSIS — I10 Essential (primary) hypertension: Secondary | ICD-10-CM | POA: Diagnosis not present

## 2016-04-12 MED ORDER — METOPROLOL SUCCINATE ER 25 MG PO TB24: ORAL_TABLET | ORAL | Status: DC

## 2016-04-12 NOTE — Progress Notes (Signed)
Cardiology Office Note   Date:  04/12/2016   ID:  Natalie Hall, DOB [birth year], MRN [id number]  PCP:  Natalie Morale, MD  Cardiologist:  Dr  Marchelle Folks, PA-C   Chief Complaint  Patient presents with  . heart racing    x 1 hour    History of Present Illness: Natalie Hall is a 65 y.o. 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  Seen 06/01 by Dr Debara Pickett and doing well. However, pt had another episode of SVT on 06/19, HR was 151.  Natalie Hall presents for Further evaluation and management of her SVT.  Since discharge from the hospital, [name] has had 2 episodes of palpitations. The first one was very brief and resolved spontaneously. She did not check her blood pressure heart rate during that episode. The second one lasted longer created more symptoms. It started at work and she was feeling better from his she had to go home. When she got home and laid down, her symptoms resolved. However, before the symptoms resolved, she was able to check her heart rate on her home blood pressure machine and it was 151. She is compliant with her Toprol-XL at 25 mg daily.  She is not using excessive caffeine. She has not had any over-the-counter cold medicines. She has not had any other illnesses or problems. 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 However, she has been doing this for 25 years and she does not feel this is unusually stressful.  She has not had any chest pain since discharge from the hospital, even during the episodes of tachycardia. She is trying to increase her activity, and has an appointment to start cardiac rehabilitation soon.   Past Medical History  Diagnosis Date  . Osteoarthritis   . Hypertension   . Hx of colonic polyp   . GERD (gastroesophageal reflux disease)   . Hyperlipidemia   . Asthma     infrequent problem  . Choroidal  nevus of left eye     sees Dr. Gerarda Fraction at [facility].   . Supraventricular tachycardia [facility])     Past Surgical History  Procedure Laterality Date  . Breast biopsy    . Carpal tunnel release    . Cholecystectomy    . Abdominal hysterectomy    . Varicose vein surgery    . Colonoscopy  06-23-14    per Dr. Ardis Hughs, adenomatous polyps, repeat in 5 yrs    . Polypectomy    . Ankle surgery      torn tendon left ankle  . Achilles tendon repair  10-07-11    torn on right, per Dr. Noemi Chapel   . Total knee arthroplasty Left 12/21/2013    Procedure: LEFT TOTAL KNEE ARTHROPLASTY;  Surgeon: Gearlean Alf, MD;  Location: [facility];  Service: Orthopedics;  Laterality: Left;  . Knee closed reduction Left 02/03/2014    Procedure: CLOSED MANIPULATION LEFT KNEE;  Surgeon: Gearlean Alf, MD;  Location: [facility];  Service: Orthopedics;  Laterality: Left;  . Cardiac catheterization N/A 03/14/2016    Procedure: Left Heart Cath and Coronary Angiography;  Surgeon: Peter M Martinique, MD;  Location: [place] CV LAB;  Service: Cardiovascular;  Laterality: N/A;  . Cardiac catheterization N/A 03/14/2016    Procedure: Coronary Stent Intervention;  Surgeon: Peter M Martinique, MD;  Location:  [place] INVASIVE CV LAB;  Service: Cardiovascular;  Laterality: N/A;    Current Outpatient Prescriptions  Medication Sig Dispense Refill  . albuterol (PROVENTIL HFA;VENTOLIN HFA) 108 (90 BASE) MCG/ACT inhaler Inhale 2 puffs into the lungs every 4 (four) hours as needed for wheezing or shortness of breath. 1 Inhaler 11  . AMBULATORY NON FORMULARY MEDICATION Take 90 mg by mouth 2 (two) times daily. Medication Name: BRILINTA 90 mg BID (TWILIGHT Research study PROVIDED)    . AMBULATORY NON FORMULARY MEDICATION Take 81 mg by mouth daily. Medication Name: ASA 81 mg Daily (TWILIGHT Research Study PROVIDED)    . CALCIUM PO Take 1 tablet by mouth daily.     . Cholecalciferol (VITAMIN D-3 PO) Take 1 tablet by mouth daily.     Marland Kitchen loratadine  (CLARITIN) 10 MG tablet Take 10 mg by mouth daily.    . metoprolol succinate (TOPROL-XL) 25 MG 24 hr tablet Take 1 tablet (25 mg total) by mouth daily. 90 tablet 3  . Multiple Vitamins-Minerals (MULTIVITAMIN PO) Take 1 tablet by mouth daily.     . nitroGLYCERIN (NITROSTAT) 0.4 MG SL tablet Place 1 tablet (0.4 mg total) under the tongue every 5 (five) minutes x 3 doses as needed for chest pain. 25 tablet 3  . omeprazole (PRILOSEC) 40 MG capsule Take 1 capsule by mouth  daily 90 capsule 0  . ramipril (ALTACE) 10 MG capsule Take 1 capsule (10 mg total) by mouth daily. 30 capsule 11  . rosuvastatin (CRESTOR) 10 MG tablet Take 1 tablet by mouth  daily 90 tablet 0  . triamcinolone (NASACORT) 55 MCG/ACT nasal inhaler Place 2 sprays into the nose daily. 3 Inhaler 3   No current facility-administered medications for this visit.    Allergies:   Codeine; Levaquin; Augmentin; and Lexapro    Social History:  The patient  reports that she has never smoked. She has never used smokeless tobacco. She reports that she does not drink alcohol or use illicit drugs.   Family History:  The patient's family history includes Colon cancer in her maternal aunt; Colon polyps in her mother; Liver cancer in her maternal aunt; Lung cancer in her brother. There is no history of Esophageal cancer, Stomach cancer, or Rectal cancer.    ROS:  Please see the history of present illness. All other systems are reviewed and negative.    PHYSICAL EXAM: VS:  BP 128/78 mmHg  Pulse 83  Ht 5\' 4"  (1.626 m)  Wt 208 lb (94.348 kg)  BMI 35.69 kg/m2 , BMI Body mass index is 35.69 kg/(m^2). GEN: Well nourished, well developed, female in no acute distress HEENT: normal for age  Neck: no JVD, no carotid bruit, no masses Cardiac: RRR; soft murmur, no rubs, or gallops Respiratory:  clear to auscultation bilaterally, normal work of breathing GI: soft, nontender, nondistended, + BS MS: no deformity or atrophy; no edema; distal pulses are  2+ in all 4 extremities  Skin: warm and dry, no rash Neuro:  Strength and sensation are intact Psych: euthymic mood, full affect   EKG:  EKG is ordered today. The ekg ordered today demonstrates sinus rhythm, rate 83, no acute ischemic changes, no Q waves noted.   Recent Labs: 03/11/2016: ALT 31; Magnesium 1.9; TSH 2.588 03/15/2016: BUN 8; Creatinine, Ser 0.55; Hemoglobin 12.0; Platelets 271; Potassium 3.7; Sodium 136    Lipid Panel    Component Value Date/Time   CHOL 128 03/12/2016 0114   TRIG 210* 03/12/2016 0114   HDL 20*  03/12/2016 0114   CHOLHDL 6.4 03/12/2016 0114   VLDL 42* 03/12/2016 0114   LDLCALC 66 03/12/2016 0114   LDLDIRECT 125.1 01/22/2013 0833     Wt Readings from Last 3 Encounters:  04/12/16 208 lb (94.348 kg)  03/22/16 210 lb (95.255 kg)  03/15/16 213 lb 6.5 oz (96.8 kg)     Other studies Reviewed: Additional studies/ records that were reviewed today include: Hospital records and testing.  ASSESSMENT AND PLAN:  1.  SVT: I discussed the treatment options available to her. Her blood pressure is well controlled, but her heart rate is on the high side of normal. I feel she would tolerate an increase in her beta blocker as she is on a very low dose. We will increase her Toprol-XL to 25 mg twice a day.  I advised that if this did not work, we could try adding Cardizem. EP referral is available, but because of her recent stenting, and dual antiplatelet therapy, EP might be reluctant to pursue ablation. Her husband sees Dr. Curt Bears, and she would like to see him as well if an EP physician as needed.  2. CAD: She is not having any ischemic symptoms. She is compliant with dual antiplatelet therapy under the [facility], and is also on a beta blocker and statin. Continue current medical therapy with an increase in the beta blocker. Follow-up in 2 months with Dr. Debara Pickett, get a lipid profile and hepatic function profile at that time.  3. Hypertension: Her blood pressure  is well controlled, but I think it is high enough to tolerate an increase in her beta blocker.  Current medicines are reviewed at length with the patient today.  The patient does not have concerns regarding medicines.  The following changes have been made:  Increase metoprolol  Labs/ tests ordered today include:   Orders Placed This Encounter  Procedures  . Lipid panel  . Hepatic function panel  . EKG 12-Lead     Disposition:   FU with Dr. Debara Pickett  Signed, Rosaria Ferries, PA-C  04/12/2016 5:09 PM    [place] Phone: [phone]; Fax: [phone]  This note was written with the assistance of speech recognition software. Please excuse any transcriptional errors.

## 2016-04-12 NOTE — Patient Instructions (Addendum)
Increase Toprol XL 25 mg twice a day   Appointment with Dr.Hilty Friday 07/13/16 at 2:00 pm   Fasting Lab ( Lipid,Liver panels before 9/22 )

## 2016-04-16 ENCOUNTER — Other Ambulatory Visit: Payer: Self-pay

## 2016-04-16 DIAGNOSIS — I10 Essential (primary) hypertension: Secondary | ICD-10-CM

## 2016-04-16 DIAGNOSIS — I471 Supraventricular tachycardia: Secondary | ICD-10-CM

## 2016-04-16 MED ORDER — RAMIPRIL 10 MG PO CAPS: 10.0000 mg | ORAL_CAPSULE | Freq: Every day | ORAL | Status: DC

## 2016-04-16 MED ORDER — NITROGLYCERIN 0.4 MG SL SUBL: 0.4000 mg | SUBLINGUAL_TABLET | SUBLINGUAL | Status: DC | PRN

## 2016-04-16 MED ORDER — METOPROLOL SUCCINATE ER 25 MG PO TB24: ORAL_TABLET | ORAL | Status: DC

## 2016-04-17 ENCOUNTER — Encounter: Payer: Self-pay | Admitting: *Deleted

## 2016-04-17 DIAGNOSIS — Z006 Encounter for examination for normal comparison and control in clinical research program: Secondary | ICD-10-CM

## 2016-04-17 NOTE — Progress Notes (Signed)
TWILIGHT Research 1 month telephone appointment completed. Patient denies any adverse or bleeding events. She states she has only missed one evening dose of brilinta, I instructed the importance of compliance. Next research appointment will be 06/20/16 @ her 9:45 Cardiac rehab visit. I instructed her to return study drug at this appointment and she will be randomized to ASA or placebo at that time. Questions encouraged and answered.

## 2016-04-18 ENCOUNTER — Emergency Department (HOSPITAL_COMMUNITY)
Admission: EM | Admit: 2016-04-18 | Discharge: 2016-04-18 | Disposition: A | Discharge status: Home / Self Care | Payer: Medicare Other | Attending: Emergency Medicine | Admitting: Emergency Medicine

## 2016-04-18 ENCOUNTER — Emergency Department (HOSPITAL_COMMUNITY): Payer: Medicare Other

## 2016-04-18 ENCOUNTER — Telehealth: Payer: Self-pay | Admitting: Cardiology

## 2016-04-18 ENCOUNTER — Encounter (HOSPITAL_COMMUNITY): Payer: Self-pay

## 2016-04-18 DIAGNOSIS — I10 Essential (primary) hypertension: Secondary | ICD-10-CM | POA: Insufficient documentation

## 2016-04-18 DIAGNOSIS — I471 Supraventricular tachycardia: Secondary | ICD-10-CM

## 2016-04-18 DIAGNOSIS — J45909 Unspecified asthma, uncomplicated: Secondary | ICD-10-CM | POA: Insufficient documentation

## 2016-04-18 DIAGNOSIS — R000 Tachycardia, unspecified: Secondary | ICD-10-CM | POA: Diagnosis present

## 2016-04-18 DIAGNOSIS — Z96652 Presence of left artificial knee joint: Secondary | ICD-10-CM | POA: Diagnosis not present

## 2016-04-18 LAB — I-STAT TROPONIN, ED: Troponin i, poc: 0.01 ng/mL (ref 0.00–0.08)

## 2016-04-18 LAB — CBC
HCT: 40.6 % (ref 36.0–46.0)
Hemoglobin: 12.9 g/dL (ref 12.0–15.0)
MCH: 26.1 pg (ref 26.0–34.0)
MCHC: 31.8 g/dL (ref 30.0–36.0)
MCV: 82 fL (ref 78.0–100.0)
PLATELETS: 289 10*3/uL (ref 150–400)
RBC: 4.95 MIL/uL (ref 3.87–5.11)
RDW: 15.6 % — AB (ref 11.5–15.5)
WBC: 7.4 10*3/uL (ref 4.0–10.5)

## 2016-04-18 LAB — BASIC METABOLIC PANEL
Anion gap: 10 (ref 5–15)
BUN: 12 mg/dL (ref 6–20)
CHLORIDE: 103 mmol/L (ref 101–111)
CO2: 20 mmol/L — AB (ref 22–32)
CREATININE: 0.53 mg/dL (ref 0.44–1.00)
Calcium: 9.4 mg/dL (ref 8.9–10.3)
GFR calc Af Amer: >60 mL/min (ref >60)
GFR calc non Af Amer: >60 mL/min (ref >60)
GLUCOSE: 106 mg/dL — AB (ref 65–99)
Potassium: 3.9 mmol/L (ref 3.5–5.1)
SODIUM: 133 mmol/L — AB (ref 135–145)

## 2016-04-18 LAB — TROPONIN I: Troponin I: <0.03 ng/mL (ref <0.03)

## 2016-04-18 MED ORDER — ADENOSINE 6 MG/2ML IV SOLN
6.0000 mg | Freq: Once | INTRAVENOUS | Status: AC
  Administered 2016-04-18: 6 mg via INTRAVENOUS
  Filled 2016-04-18: qty 2

## 2016-04-18 MED ORDER — ADENOSINE 6 MG/2ML IV SOLN
INTRAVENOUS | Status: AC
  Filled 2016-04-18: qty 2

## 2016-04-18 NOTE — ED Notes (Signed)
Patient here with rapid heart rate x 3 hours. Bartholome Bill taken lopressor 25mg  x 2 for same with no relief. Denies CP.  Had similar event in May and had cath with stent

## 2016-04-18 NOTE — Telephone Encounter (Signed)
Called by patient, reported HR was in 130's since 3:30 this afternoon. She has home BP machine and the last 3 readings were 106/64, 91/73 and 80/61. Denies chest pain, denies SOB and dizziness. Does feel palpitations. Patient took an additional 25mg  Metoprolol at 3:30 today, she has not noted a decrease in her HR. Advised patient to go to ED, she was already on the way there, her husband is driving her.

## 2016-04-18 NOTE — ED Provider Notes (Signed)
CSN: [id number]     Arrival date & time 04/18/16  1837 History  By signing my name below, I, Rowan Blase, attest that this documentation has been prepared under the direction and in the presence of Blanchie Dessert, MD . Electronically Signed: Rowan Blase, Scribe. 04/18/2016. 7:15 PM.   Chief Complaint  Patient presents with  . Tachycardia    The history is provided by the patient. No language interpreter was used.   HPI Comments:  Natalie Hall is a 65 y.o. female with PMHx of HTN, HLD, and SVT who presents to the Emergency Department complaining of rapid heart rate for the past 3 hours. Pt notes similar symptoms in May and was admitted for a cath with stent placement on 03/14/2016. She notes 1 episode of tachycardia 1 week ago. Pt followed-up with her cardiologist who increased her Metoprolol to 25 mg 2x per day. Pt took an extra Metoprolol ~4pm today. 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 Denies chest pain or shortness of breath.  Past Medical History  Diagnosis Date  . Osteoarthritis   . Hypertension   . Hx of colonic polyp   . GERD (gastroesophageal reflux disease)   . Hyperlipidemia   . Asthma     infrequent problem  . Choroidal nevus of left eye     sees Dr. Gerarda Fraction at [facility].   . Supraventricular tachycardia [facility])    Past Surgical History  Procedure Laterality Date  . Breast biopsy    . Carpal tunnel release    . Cholecystectomy    . Abdominal hysterectomy    . Varicose vein surgery    . Colonoscopy  06-23-14    per Dr. Ardis Hughs, adenomatous polyps, repeat in 5 yrs    . Polypectomy    . Ankle surgery      torn tendon left ankle  . Achilles tendon repair  10-07-11    torn on right, per Dr. Noemi Chapel   . Total knee arthroplasty Left 12/21/2013    Procedure: LEFT TOTAL KNEE ARTHROPLASTY;  Surgeon: Gearlean Alf, MD;  Location: [facility];  Service: Orthopedics;  Laterality: Left;  . Knee closed reduction Left 02/03/2014    Procedure: CLOSED  MANIPULATION LEFT KNEE;  Surgeon: Gearlean Alf, MD;  Location: [facility];  Service: Orthopedics;  Laterality: Left;  . Cardiac catheterization N/A 03/14/2016    Procedure: Left Heart Cath and Coronary Angiography;  Surgeon: Peter M Martinique, MD;  Location: [place] CV LAB;  Service: Cardiovascular;  Laterality: N/A;  . Cardiac catheterization N/A 03/14/2016    Procedure: Coronary Stent Intervention;  Surgeon: Peter M Martinique, MD;  Location: [place] CV LAB;  Service: Cardiovascular;  Laterality: N/A;   Family History  Problem Relation Age of Onset  . Breast cancer    . Coronary artery disease    . Colon cancer    . Diabetes    . Hypertension    . Kidney disease    . Lung cancer    . Colon polyps Mother   . Colon cancer Maternal Aunt   . Liver cancer Maternal Aunt   . Lung cancer Brother     spleen, bone  . Esophageal cancer Neg Hx   . Stomach cancer Neg Hx   . Rectal cancer Neg Hx    Social History  Substance Use Topics  . Smoking status: Never Smoker   . Smokeless tobacco: Never Used  . Alcohol Use: No   OB History  No data available     Review of Systems  Respiratory: Negative for shortness of breath.   Cardiovascular: Positive for palpitations. Negative for chest pain.  All other systems reviewed and are negative.   Allergies  Codeine; Levaquin; Augmentin; and Lexapro  Home Medications   Prior to Admission medications   Medication Sig Start Date End Date Taking? Authorizing Provider  albuterol (PROVENTIL HFA;VENTOLIN HFA) 108 (90 BASE) MCG/ACT inhaler Inhale 2 puffs into the lungs every 4 (four) hours as needed for wheezing or shortness of breath. 02/03/15  Yes Laurey Morale, MD  AMBULATORY NON FORMULARY MEDICATION Take 90 mg by mouth 2 (two) times daily. Medication Name: BRILINTA 90 mg BID (TWILIGHT Research study PROVIDED) 03/15/16  Yes Burnell Blanks, MD  AMBULATORY NON FORMULARY MEDICATION Take 81 mg by mouth daily. Medication Name: ASA 81 mg Daily  (TWILIGHT Research Study PROVIDED) 03/15/16  Yes Burnell Blanks, MD  CALCIUM PO Take 1 tablet by mouth daily.    Yes Historical Provider, MD  Cholecalciferol (VITAMIN D-3 PO) Take 1 tablet by mouth daily.    Yes Historical Provider, MD  loratadine (CLARITIN) 10 MG tablet Take 10 mg by mouth daily.   Yes Historical Provider, MD  metoprolol succinate (TOPROL XL) 25 MG 24 hr tablet Take 25 mg twice a day Patient taking differently: Take 25 mg by mouth 2 (two) times daily. Take 25 mg twice a day 04/16/16  Yes Pixie Casino, MD  Multiple Vitamins-Minerals (MULTIVITAMIN PO) Take 1 tablet by mouth daily.    Yes Historical Provider, MD  nitroGLYCERIN (NITROSTAT) 0.4 MG SL tablet Place 1 tablet (0.4 mg total) under the tongue every 5 (five) minutes x 3 doses as needed for chest pain. 04/16/16  Yes Pixie Casino, MD  omeprazole (PRILOSEC) 40 MG capsule Take 1 capsule by mouth  daily 04/03/16  Yes Laurey Morale, MD  ramipril (ALTACE) 10 MG capsule Take 1 capsule (10 mg total) by mouth daily. 04/16/16  Yes Pixie Casino, MD  rosuvastatin (CRESTOR) 10 MG tablet Take 1 tablet by mouth  daily 04/03/16  Yes Laurey Morale, MD  triamcinolone (NASACORT) 55 MCG/ACT nasal inhaler Place 2 sprays into the nose daily. 01/29/13  Yes Laurey Morale, MD   BP 125/64 mmHg  Pulse 100  Temp(Src) 98.5 F (36.9 C) (Oral)  Resp 16  SpO2 100%   Physical Exam  Constitutional: She is oriented to person, place, and time. She appears well-developed and well-nourished. No distress.  HENT:  Head: Normocephalic and atraumatic.  Eyes: EOM are normal.  Neck: Normal range of motion.  Cardiovascular: Normal rate, regular rhythm and normal heart sounds.   Pulmonary/Chest: Effort normal and breath sounds normal.  Abdominal: Soft. She exhibits no distension. There is no tenderness.  Musculoskeletal: Normal range of motion.  Neurological: She is alert and oriented to person, place, and time.  Skin: Skin is warm and dry.   Psychiatric: She has a normal mood and affect. Judgment normal.  Nursing note and vitals reviewed.   ED Course  Procedures  DIAGNOSTIC STUDIES:  Oxygen Saturation is 99% on RA, normal by my interpretation.    COORDINATION OF CARE:  7:05 PM Will give adenosine 6mg . Will order chest x-ray, BMP, CBC and troponin. Discussed treatment plan with pt at bedside and pt agreed to plan.  7:15 PM 6mg  Adenosine given. Pt returned to sinus rhythm.   Labs Review Labs Reviewed  BASIC METABOLIC PANEL - Abnormal; Notable for the  following:    Sodium 133 (*)    CO2 20 (*)    Glucose, Bld 106 (*)    All other components within normal limits  CBC - Abnormal; Notable for the following:    RDW 15.6 (*)    All other components within normal limits  TROPONIN I  Randolm Idol, ED    Imaging Review Dg Chest 2 View  04/18/2016  CLINICAL DATA:  Supraventricular tachycardia. EXAM: CHEST  2 VIEW COMPARISON:  12/15/2013 FINDINGS: Normal heart size and mediastinal contours. No acute infiltrate or edema. No effusion or pneumothorax. No acute osseous findings. IMPRESSION: No active cardiopulmonary disease. Electronically Signed   By: Monte Fantasia [name]   On: 04/18/2016 20:15   I have personally reviewed and evaluated these images and lab results as part of my medical decision-making.   EKG Interpretation   Date/Time:  Wednesday April 18 2016 19:15:29 EDT Ventricular Rate:  94 PR Interval:    QRS Duration: 85 QT Interval:  305 QTC Calculation: 382 R Axis:   20 Text Interpretation:  Sinus rhythm Low voltage, precordial leads new  Abnormal T, consider ischemia, diffuse leads Supraventricular tachycardia  RESOLVED SINCE PREVIOUS Confirmed by Maryan Rued  MD, Loree Fee ([id number]) on  04/18/2016 7:26:50 PM      MDM   Final diagnoses:  SVT (supraventricular tachycardia) [facility])    Patient is a 65 year old female presenting today with palpitations that started about 3:30 PM. She has a history of SVT and  recently had stenting done of her RCA at the end of May. She saw her cardiologist last week and at that time had metoprolol increased to twice a day. She did take an extra metoprolol today without improvement in the palpitations. Upon arrival here patient was in SVT by EKG. After 1 round of 6 mg of adenosine she converted to sinus rhythm. At no time did she have chest pain or shortness of breath. EKG after conversion showed sinus rhythm but new T wave inversion. Patient continues to deny any chest pain or shortness of breath at this time. X-ray, CBC, BMP all within normal limits. I-STAT troponin was negative. Spoke with cardiology and they recommended doing a regular troponin. If that was within normal limits patient can go home and follow-up in the office.  Trop neg and pt d/ced home. I personally performed the services described in this documentation, which was scribed in my presence.  The recorded information has been reviewed and considered.   Blanchie Dessert, MD 04/18/16 2216

## 2016-04-19 ENCOUNTER — Telehealth: Payer: Self-pay | Admitting: Internal Medicine

## 2016-04-19 DIAGNOSIS — I471 Supraventricular tachycardia: Secondary | ICD-10-CM

## 2016-04-19 NOTE — Telephone Encounter (Signed)
Referral order placed after speaking w/ patient and communicating Dr. Lysbeth Penner advice. Pt amenable to this referral and rationale. Aware to call if she has further questions or concerns.

## 2016-04-19 NOTE — Telephone Encounter (Signed)
Returned call. Patient was seen in ED last night for SVT. Administered adenosine for correction and had successful return to NSR. She was advised to contact our office for recommendation on next f/u - notes she was just seen in office by Suanne Marker on Thursday. Will route to Dr. Debara Pickett for advice. - currently not scheduled to return until 07/13/16 Pt aware we will have scheduler call her w/ availability, once Dr. Debara Pickett has given recommendations for next visit.

## 2016-04-19 NOTE — Telephone Encounter (Signed)
New message   Pt is calling because she wants rn to call her she was in the hospital @ [facility] 04-18-16  she wants to go over medication changes

## 2016-04-19 NOTE — Telephone Encounter (Signed)
Refer to Dr. Curt Bears (her husband sees him) to evaluate for possible SVT ablation.  Dr. Debara Pickett

## 2016-04-29 NOTE — Progress Notes (Signed)
Electrophysiology Office Note   Date:  04/30/2016   ID:  Natalie Hall, DOB 1951-08-21, MRN [id number]  PCP:  Laurey Morale, MD  Cardiologist:  Debara Pickett Primary Electrophysiologist:  Maryfer Tauzin Meredith Leeds, MD    Chief Complaint  Patient presents with  . New Patient (Initial Visit)  . Tachycardia     History of Present Illness: Natalie Hall is a 65 y.o. female who presents today for electrophysiology evaluation.   Hx HTN, HLD, CAD s/p DES  to the RCA post NSTEMI, SVT who presented to the ER in June with tachycardia for 3 hours.    She was given adenosine 6 mg and had return to sinus rhythm.  She has had multiple episodes of SVT over the last month some of which have required hospitalization. She says that the most recent occurred last weekend, after her metoprolol was increased. She stated SVT for about 15 minutes and popped out on her own.   Today, she denies symptoms of palpitations, chest pain, shortness of breath, orthopnea, PND, lower extremity edema, claudication, dizziness, presyncope, syncope, bleeding, or neurologic sequela. The patient is tolerating medications without difficulties and is otherwise without complaint today.    Past Medical History  Diagnosis Date  . Osteoarthritis   . Hypertension   . Hx of colonic polyp   . GERD (gastroesophageal reflux disease)   . Hyperlipidemia   . Asthma     infrequent problem  . Choroidal nevus of left eye     sees Dr. Gerarda Fraction at [facility].   . Supraventricular tachycardia [facility])    Past Surgical History  Procedure Laterality Date  . Breast biopsy    . Carpal tunnel release    . Cholecystectomy    . Abdominal hysterectomy    . Varicose vein surgery    . Colonoscopy  06-23-14    per Dr. Ardis Hughs, adenomatous polyps, repeat in 5 yrs    . Polypectomy    . Ankle surgery      torn tendon left ankle  . Achilles tendon repair  10-07-11    torn on right, per Dr. Noemi Chapel   . Total knee arthroplasty Left 12/21/2013   Procedure: LEFT TOTAL KNEE ARTHROPLASTY;  Surgeon: Gearlean Alf, MD;  Location: [facility];  Service: Orthopedics;  Laterality: Left;  . Knee closed reduction Left 02/03/2014    Procedure: CLOSED MANIPULATION LEFT KNEE;  Surgeon: Gearlean Alf, MD;  Location: [facility];  Service: Orthopedics;  Laterality: Left;  . Cardiac catheterization N/A 03/14/2016    Procedure: Left Heart Cath and Coronary Angiography;  Surgeon: Peter M Martinique, MD;  Location: [place] [facility];  Service: Cardiovascular;  Laterality: N/A;  . Cardiac catheterization N/A 03/14/2016    Procedure: Coronary Stent Intervention;  Surgeon: Peter M Martinique, MD;  Location: [place] CV [facility];  Service: Cardiovascular;  Laterality: N/A;     Current Outpatient Prescriptions  Medication Sig Dispense Refill  . albuterol (PROVENTIL HFA;VENTOLIN HFA) 108 (90 BASE) MCG/ACT inhaler Inhale 2 puffs into the lungs every 4 (four) hours as needed for wheezing or shortness of breath. 1 Inhaler 11  . AMBULATORY NON FORMULARY MEDICATION Take 90 mg by mouth 2 (two) times daily. Medication Name: BRILINTA 90 mg BID (TWILIGHT Research study PROVIDED)    . AMBULATORY NON FORMULARY MEDICATION Take 81 mg by mouth daily. Medication Name: ASA 81 mg Daily (TWILIGHT Research Study PROVIDED)    . CALCIUM PO Take 1 tablet by mouth daily.     Marland Kitchen  Cholecalciferol (VITAMIN D-3 PO) Take 1 tablet by mouth daily.     Marland Kitchen loratadine (CLARITIN) 10 MG tablet Take 10 mg by mouth daily.    . metoprolol succinate (TOPROL XL) 25 MG 24 hr tablet Take 25 mg twice a day (Patient taking differently: Take 25 mg by mouth 2 (two) times daily. Take 25 mg twice a day) 180 tablet 3  . Multiple Vitamins-Minerals (MULTIVITAMIN PO) Take 1 tablet by mouth daily.     . nitroGLYCERIN (NITROSTAT) 0.4 MG SL tablet Place 1 tablet (0.4 mg total) under the tongue every 5 (five) minutes x 3 doses as needed for chest pain. 25 tablet 3  . omeprazole (PRILOSEC) 40 MG capsule Take 1 capsule by mouth  daily 90  capsule 0  . ramipril (ALTACE) 10 MG capsule Take 1 capsule (10 mg total) by mouth daily. 90 capsule 3  . rosuvastatin (CRESTOR) 10 MG tablet Take 1 tablet by mouth  daily 90 tablet 0  . triamcinolone (NASACORT) 55 MCG/ACT nasal inhaler Place 2 sprays into the nose daily. 3 Inhaler 3   No current facility-administered medications for this visit.    Allergies:   Codeine; Levaquin; Augmentin; and Lexapro   Social History:  The patient  reports that she has never smoked. She has never used smokeless tobacco. She reports that she does not drink alcohol or use illicit drugs.   Family History:  The patient's family history includes Colon cancer in her maternal aunt; Colon polyps in her mother; Liver cancer in her maternal aunt; Lung cancer in her brother. There is no history of Esophageal cancer, Stomach cancer, or Rectal cancer.    ROS:  Please see the history of present illness.   Otherwise, review of systems is positive for none.   All other systems are reviewed and negative.    PHYSICAL EXAM: VS:  BP 122/78 mmHg  Pulse 76  Ht 5' 4.5" (1.638 m)  Wt 204 lb 9.6 oz (92.806 kg)  BMI 34.59 kg/m2 , BMI Body mass index is 34.59 kg/(m^2). GEN: Well nourished, well developed, in no acute distress HEENT: normal Neck: no JVD, carotid bruits, or masses Cardiac: RRR; no murmurs, rubs, or gallops,no edema  Respiratory:  clear to auscultation bilaterally, normal work of breathing GI: soft, nontender, nondistended, + BS MS: no deformity or atrophy Skin: warm and dry Neuro:  Strength and sensation are intact Psych: euthymic mood, full affect  EKG:  EKG is not ordered today.   Recent Labs: 03/11/2016: ALT 31; Magnesium 1.9; TSH 2.588 04/18/2016: BUN 12; Creatinine, Ser 0.53; Hemoglobin 12.9; Platelets 289; Potassium 3.9; Sodium 133*    Lipid Panel     Component Value Date/Time   CHOL 128 03/12/2016 0114   TRIG 210* 03/12/2016 0114   HDL 20* 03/12/2016 0114   CHOLHDL 6.4 03/12/2016 0114    VLDL 42* 03/12/2016 0114   LDLCALC 66 03/12/2016 0114   LDLDIRECT 125.1 01/22/2013 0833     Wt Readings from Last 3 Encounters:  04/30/16 204 lb 9.6 oz (92.806 kg)  04/12/16 208 lb (94.348 kg)  03/22/16 210 lb (95.255 kg)      Other studies Reviewed: Additional studies/ records that were reviewed today include:  TTE 03/12/16 - Left ventricle: The cavity size was normal. [place] thickness was  normal. Systolic function was vigorous. The estimated ejection  fraction was in the range of 65% to 70%. Doppler parameters are  consistent with abnormal left ventricular relaxation (grade 1  diastolic dysfunction).  Cath 03/14/16  Prox RCA lesion, 25% stenosed.  The left ventricular systolic function is normal.  Prox LAD to Mid LAD lesion, 15% stenosed.  Mid RCA-1 lesion, 95% stenosed.  Mid RCA-2 lesion, 95% stenosed. Post intervention, there is a 0% residual stenosis.  1. Single vessel obstructive CAD 2. Normal LV function 3. Successful stenting of the mid to distal RCA with a DES   ASSESSMENT AND PLAN:  1.  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  Discussed risks and benefits of ablation.  Risks include bleeding, tamponade, heart block, stroke, among others. The patient understands these risks and has agreed to the procedure. She states that she has tried Conservation officer, nature and this has not worked for her. We Ireland Chagnon schedule her for ablation as soon as possible.    Current medicines are reviewed at length with the patient today.   The patient does not have concerns regarding her medicines.  The following changes were made today:  none  Labs/ tests ordered today include:  No orders of the defined types were placed in this encounter.     Disposition:   FU with Tylea Hise 3 months  Signed, Maribel Hadley Meredith Leeds, MD  04/30/2016 10:15 AM     [facility] [address] [place] [address] [phone]  (office) [phone] (fax)

## 2016-04-30 ENCOUNTER — Encounter (INDEPENDENT_AMBULATORY_CARE_PROVIDER_SITE_OTHER): Payer: Self-pay

## 2016-04-30 ENCOUNTER — Encounter: Payer: Self-pay | Admitting: *Deleted

## 2016-04-30 ENCOUNTER — Ambulatory Visit (INDEPENDENT_AMBULATORY_CARE_PROVIDER_SITE_OTHER): Payer: Medicare Other | Admitting: Cardiology

## 2016-04-30 ENCOUNTER — Encounter: Payer: Self-pay | Admitting: Cardiology

## 2016-04-30 VITALS — BP 122/78 | HR 76 | Ht 64.5 in | Wt 204.6 lb

## 2016-04-30 DIAGNOSIS — Z01812 Encounter for preprocedural laboratory examination: Secondary | ICD-10-CM | POA: Diagnosis not present

## 2016-04-30 DIAGNOSIS — I471 Supraventricular tachycardia: Secondary | ICD-10-CM

## 2016-04-30 NOTE — Patient Instructions (Addendum)
Medication Instructions:  Your physician recommends that you continue on your current medications as directed. Please refer to the Current Medication list given to you today.  Labwork: Your physician recommends that you return for pre procedure lab work between 7/17-7/28.  Testing/Procedures: Your physician has recommended that you have an SVT ablation. Catheter ablation is a medical procedure used to treat some cardiac arrhythmias (irregular heartbeats). During catheter ablation, a long, thin, flexible tube is put into a blood vessel in your groin (upper thigh), or neck. This tube is called an ablation catheter. It is then guided to your heart through the blood vessel. Radio frequency waves destroy small areas of heart tissue where abnormal heartbeats may cause an arrhythmia to start. Please see the instruction sheet given to you today.  Follow-Up: Your physician recommends that you schedule a follow-up appointment in: 4 weeks, after your procedure on 05/21/16, with Dr. Curt Bears.  If you need a refill on your cardiac medications before your next appointment, please call your pharmacy.  Thank you for choosing [facility]!!   [name] Curet, RN [phone]  Any Other Special Instructions Will Be Listed Below (If Applicable).  Cardiac Ablation Cardiac ablation is a procedure to disable a small amount of heart tissue in very specific places. The heart has many electrical connections. Sometimes these connections are abnormal and can cause the heart to beat very fast or irregularly. By disabling some of the problem areas, heart rhythm can be improved or made normal. Ablation is done for people who:   Have Wolff-Parkinson-White syndrome.   Have other fast heart rhythms (tachycardia).   Have taken medicines for an abnormal heart rhythm (arrhythmia) that resulted in:   No success.   Side effects.   May have a high-risk heartbeat that could result in death.  LET [facility] CARE  PROVIDER KNOW ABOUT:   Any allergies you have or any previous reactions you have had to X-ray dye, food (such as seafood), medicine, or tape.   All medicines you are taking, including vitamins, herbs, eye drops, creams, and over-the-counter medicines.   Previous problems you or members of your family have had with the use of anesthetics.   Any blood disorders you have.   Previous surgeries or procedures (such as a kidney transplant) you have had.   Medical conditions you have (such as kidney failure).  RISKS AND COMPLICATIONS Generally, cardiac ablation is a safe procedure. However, problems can occur and include:   Increased risk of cancer. Depending on how long it takes to do the ablation, the dose of radiation can be high.  Bruising and bleeding where a thin, flexible tube (catheter) was inserted during the procedure.   Bleeding into the chest, especially into the sac that surrounds the heart (serious).  Need for a permanent pacemaker if the normal electrical system is damaged.   The procedure may not be fully effective, and this may not be recognized for months. Repeat ablation procedures are sometimes required. BEFORE THE PROCEDURE   Follow any instructions from your health care provider regarding eating and drinking before the procedure.   Take your medicines as directed at regular times with water, unless instructed otherwise by your health care provider. If you are taking diabetes medicine, including insulin, ask how you are to take it and if there are any special instructions you should follow. It is common to adjust insulin dosing the day of the ablation.  PROCEDURE  An ablation is usually performed in a catheterization laboratory with  the guidance of fluoroscopy. Fluoroscopy is a type of X-ray that helps your health care provider see images of your heart during the procedure.   An ablation is a minimally invasive procedure. This means a small cut (incision) is  made in either your neck or groin. Your health care provider will decide where to make the incision based on your medical history and physical exam.  An IV tube will be started before the procedure begins. You will be given an anesthetic or medicine to help you relax (sedative).  The skin on your neck or groin will be numbed. A needle will be inserted into a large vein in your neck or groin and catheters will be threaded to your heart.  A special dye that shows up on fluoroscopy pictures may be injected through the catheter. The dye helps your health care provider see the area of the heart that needs treatment.  The catheter has electrodes on the tip. When the area of heart tissue that is causing the arrhythmia is found, the catheter tip will send an electrical current to the area and "scar" the tissue. Three types of energy can be used to ablate the heart tissue:   Heat (radiofrequency energy).   Laser energy.   Extreme cold (cryoablation).   When the area of the heart has been ablated, the catheter will be taken out. Pressure will be held on the insertion site. This will help the insertion site clot and keep it from bleeding. A bandage will be placed on the insertion site.  AFTER THE PROCEDURE   After the procedure, you will be taken to a recovery area where your vital signs (blood pressure, heart rate, and breathing) will be monitored. The insertion site will also be monitored for bleeding.   You will need to lie still for 4-6 hours. This is to ensure you do not bleed from the catheter insertion site.    This information is not intended to replace advice given to you by your health care provider. Make sure you discuss any questions you have with your health care provider.   Document Released: 02/24/2009 Document Revised: 10/29/2014 Document Reviewed: 03/02/2013 Elsevier Interactive Patient Education [employer].

## 2016-05-02 ENCOUNTER — Telehealth (HOSPITAL_COMMUNITY): Payer: Self-pay | Admitting: Cardiac Rehabilitation

## 2016-05-02 NOTE — Telephone Encounter (Deleted)
pc to pt to

## 2016-05-02 NOTE — Telephone Encounter (Signed)
/  pc to pt to discuss cardiac rehab orientation. Pt reports she is scheduled for SVT ablation on 05/21/16.  Orientation will be rescheduled for 05/24/16 with plan to start exercise 05/28/16.  Pt verbalized understanding.

## 2016-05-03 ENCOUNTER — Inpatient Hospital Stay (HOSPITAL_COMMUNITY): Admission: RE | Admit: 2016-05-03 | Payer: Medicare Other | Source: Ambulatory Visit

## 2016-05-07 ENCOUNTER — Encounter (HOSPITAL_COMMUNITY): Payer: Medicare Other

## 2016-05-09 ENCOUNTER — Encounter (HOSPITAL_COMMUNITY): Payer: Medicare Other

## 2016-05-11 ENCOUNTER — Telehealth: Payer: Self-pay | Admitting: Internal Medicine

## 2016-05-11 ENCOUNTER — Encounter (HOSPITAL_COMMUNITY): Payer: Medicare Other

## 2016-05-11 ENCOUNTER — Encounter (HOSPITAL_COMMUNITY): Payer: Self-pay | Admitting: *Deleted

## 2016-05-11 ENCOUNTER — Emergency Department (HOSPITAL_COMMUNITY)
Admission: EM | Admit: 2016-05-11 | Discharge: 2016-05-11 | Disposition: A | Discharge status: Home / Self Care | Payer: Medicare Other | Attending: Emergency Medicine | Admitting: Emergency Medicine

## 2016-05-11 DIAGNOSIS — I10 Essential (primary) hypertension: Secondary | ICD-10-CM | POA: Diagnosis not present

## 2016-05-11 DIAGNOSIS — Z79899 Other long term (current) drug therapy: Secondary | ICD-10-CM | POA: Diagnosis not present

## 2016-05-11 DIAGNOSIS — I471 Supraventricular tachycardia: Secondary | ICD-10-CM | POA: Insufficient documentation

## 2016-05-11 DIAGNOSIS — R000 Tachycardia, unspecified: Secondary | ICD-10-CM | POA: Diagnosis present

## 2016-05-11 DIAGNOSIS — Z96652 Presence of left artificial knee joint: Secondary | ICD-10-CM | POA: Diagnosis not present

## 2016-05-11 DIAGNOSIS — J45909 Unspecified asthma, uncomplicated: Secondary | ICD-10-CM | POA: Insufficient documentation

## 2016-05-11 LAB — I-STAT CHEM 8, ED
BUN: 18 mg/dL (ref 6–20)
CHLORIDE: 98 mmol/L — AB (ref 101–111)
CREATININE: 0.6 mg/dL (ref 0.44–1.00)
Calcium, Ion: 1.16 mmol/L (ref 1.12–1.23)
GLUCOSE: 106 mg/dL — AB (ref 65–99)
HEMATOCRIT: 43 % (ref 36.0–46.0)
HEMOGLOBIN: 14.6 g/dL (ref 12.0–15.0)
POTASSIUM: 4 mmol/L (ref 3.5–5.1)
Sodium: 135 mmol/L (ref 135–145)
TCO2: 22 mmol/L (ref 0–100)

## 2016-05-11 MED ORDER — ADENOSINE 6 MG/2ML IV SOLN
INTRAVENOUS | Status: AC
  Filled 2016-05-11: qty 2

## 2016-05-11 MED ORDER — ADENOSINE 6 MG/2ML IV SOLN
6.0000 mg | Freq: Once | INTRAVENOUS | Status: AC
  Administered 2016-05-11: 6 mg via INTRAVENOUS
  Filled 2016-05-11: qty 2

## 2016-05-11 NOTE — ED Notes (Signed)
Pt. Instructed on vagal maneuvers by MD. These maneuvers attempted twice with no change in heart rhythm.

## 2016-05-11 NOTE — Telephone Encounter (Signed)
Patient called in, wanted our office to be aware she was going to ED for eval of fast heart rate (h/o CAD, SVT - pending SVT ablation) Patient wanted [name] to call [place] to notify them of her pending arrival Contacted nurse first @ [facility] ED and provided notification Contacted patient to notify her that [facility] has been made aware

## 2016-05-11 NOTE — ED Notes (Signed)
Pt tried to E sign but it did not take signature

## 2016-05-11 NOTE — ED Notes (Signed)
Patient undressed, in gown, on monitor, continuous pulse oximetry and blood pressure cuff; visitor at bedside 

## 2016-05-11 NOTE — ED Provider Notes (Signed)
CSN: [id number]     Arrival date & time 05/11/16  1223 History   First MD Initiated Contact with Patient 05/11/16 1239     Chief Complaint  Patient presents with  . Tachycardia     (Consider location/radiation/quality/duration/timing/severity/associated sxs/prior Treatment) HPI  65 year old female presents in SVT. Patient has had SVT multiple times before. About 1-1/2 hours prior to arrival she felt her heart rate increasing. Denies chest pain, dizziness, shortness of breath, or lightheadedness. Had similar presentation on 6/28 and had to be given adenosine. Tried bearing down with no relief. She is scheduled for an ablation in about 10 days. Has not felt ill recently.  Past Medical History  Diagnosis Date  . Osteoarthritis   . Hypertension   . Hx of colonic polyp   . GERD (gastroesophageal reflux disease)   . Hyperlipidemia   . Asthma     infrequent problem  . Choroidal nevus of left eye     sees Dr. Gerarda Fraction at [facility].   . Supraventricular tachycardia [facility])    Past Surgical History  Procedure Laterality Date  . Breast biopsy    . Carpal tunnel release    . Cholecystectomy    . Abdominal hysterectomy    . Varicose vein surgery    . Colonoscopy  06-23-14    per Dr. Ardis Hughs, adenomatous polyps, repeat in 5 yrs    . Polypectomy    . Ankle surgery      torn tendon left ankle  . Achilles tendon repair  10-07-11    torn on right, per Dr. Noemi Chapel   . Total knee arthroplasty Left 12/21/2013    Procedure: LEFT TOTAL KNEE ARTHROPLASTY;  Surgeon: Gearlean Alf, MD;  [facility];  Service: Orthopedics;  Laterality: Left;  . Knee closed reduction Left 02/03/2014    Procedure: CLOSED MANIPULATION LEFT KNEE;  Surgeon: Gearlean Alf, MD;  [facility];  Service: Orthopedics;  Laterality: Left;  . Cardiac catheterization N/A 03/14/2016    Procedure: Left Heart Cath and Coronary Angiography;  Surgeon: Peter M Martinique, MD;  Location: [place] [facility];  Service:  Cardiovascular;  Laterality: N/A;  . Cardiac catheterization N/A 03/14/2016    Procedure: Coronary Stent Intervention;  Surgeon: Peter M Martinique, MD;  Location: [place] [facility];  Service: Cardiovascular;  Laterality: N/A;   Family History  Problem Relation Age of Onset  . Breast cancer    . Coronary artery disease    . Colon cancer    . Diabetes    . Hypertension    . Kidney disease    . Lung cancer    . Colon polyps Mother   . Colon cancer Maternal Aunt   . Liver cancer Maternal Aunt   . Lung cancer Brother     spleen, bone  . Esophageal cancer Neg Hx   . Stomach cancer Neg Hx   . Rectal cancer Neg Hx    Social History  Substance Use Topics  . Smoking status: Never Smoker   . Smokeless tobacco: Never Used  . Alcohol Use: No   OB History    No data available     Review of Systems  Constitutional: Negative for fever.  Respiratory: Negative for shortness of breath.   Cardiovascular: Positive for palpitations. Negative for chest pain.  Gastrointestinal: Negative for nausea, vomiting and diarrhea.  Neurological: Negative for dizziness, syncope and light-headedness.  All other systems reviewed and are negative.     Allergies  Codeine; Levaquin; Augmentin; and Lexapro  Home Medications   Prior to Admission medications   Medication Sig Start Date End Date Taking? Authorizing Provider  albuterol (PROVENTIL HFA;VENTOLIN HFA) 108 (90 BASE) MCG/ACT inhaler Inhale 2 puffs into the lungs every 4 (four) hours as needed for wheezing or shortness of breath. 02/03/15   Laurey Morale, MD  AMBULATORY NON FORMULARY MEDICATION Take 90 mg by mouth 2 (two) times daily. Medication Name: BRILINTA 90 mg BID (TWILIGHT Research study PROVIDED) 03/15/16   Burnell Blanks, MD  AMBULATORY NON FORMULARY MEDICATION Take 81 mg by mouth daily. Medication Name: ASA 81 mg Daily (TWILIGHT Research Study PROVIDED) 03/15/16   Burnell Blanks, MD  CALCIUM PO Take 1 tablet by mouth daily.      Historical Provider, MD  Cholecalciferol (VITAMIN D-3 PO) Take 1 tablet by mouth daily.     Historical Provider, MD  loratadine (CLARITIN) 10 MG tablet Take 10 mg by mouth daily.    Historical Provider, MD  metoprolol succinate (TOPROL XL) 25 MG 24 hr tablet Take 25 mg twice a day Patient taking differently: Take 25 mg by mouth 2 (two) times daily. Take 25 mg twice a day 04/16/16   Pixie Casino, MD  Multiple Vitamins-Minerals (MULTIVITAMIN PO) Take 1 tablet by mouth daily.     Historical Provider, MD  nitroGLYCERIN (NITROSTAT) 0.4 MG SL tablet Place 1 tablet (0.4 mg total) under the tongue every 5 (five) minutes x 3 doses as needed for chest pain. 04/16/16   Pixie Casino, MD  omeprazole (PRILOSEC) 40 MG capsule Take 1 capsule by mouth  daily 04/03/16   Laurey Morale, MD  ramipril (ALTACE) 10 MG capsule Take 1 capsule (10 mg total) by mouth daily. 04/16/16   Pixie Casino, MD  rosuvastatin (CRESTOR) 10 MG tablet Take 1 tablet by mouth  daily 04/03/16   Laurey Morale, MD  triamcinolone (NASACORT) 55 MCG/ACT nasal inhaler Place 2 sprays into the nose daily. 01/29/13   Laurey Morale, MD   BP 123/74 mmHg  Pulse 154  Temp(Src) 98.7 F (37.1 C) (Oral)  Resp 22  SpO2 98% Physical Exam  Constitutional: She is oriented to person, place, and time. She appears well-developed and well-nourished. No distress.  HENT:  Head: Normocephalic and atraumatic.  Right Ear: External ear normal.  Left Ear: External ear normal.  Nose: Nose normal.  Eyes: Right eye exhibits no discharge. Left eye exhibits no discharge.  Cardiovascular: Regular rhythm and normal heart sounds.  Tachycardia present.   Pulmonary/Chest: Effort normal and breath sounds normal.  Abdominal: Soft. There is no tenderness.  Neurological: She is alert and oriented to person, place, and time.  Skin: Skin is warm and dry. She is not diaphoretic.  Nursing note and vitals reviewed.   ED Course  Procedures (including critical care  time) Labs Review Labs Reviewed  I-STAT CHEM 8, ED - Abnormal; Notable for the following:    Chloride 98 (*)    Glucose, Bld 106 (*)    All other components within normal limits    Imaging Review No results found. I have personally reviewed and evaluated these images and lab results as part of my medical decision-making.   EKG Interpretation   Date/Time:  Friday May 11 2016 12:30:19 EDT Ventricular Rate:  160 PR Interval:    QRS Duration: 96 QT Interval:  284 QTC Calculation: 463 R Axis:   -13 Text Interpretation:  Supraventricular tachycardia Marked ST abnormality,  possible inferior subendocardial injury Abnormal ECG Confirmed by Chang Tiggs  MD, Leland Staszewski [phone]) on 05/11/2016 1:41:44 PM       EKG Interpretation  Date/Time:  Friday May 11 2016 13:08:18 EDT Ventricular Rate:  92 PR Interval:    QRS Duration: 87 QT Interval:  331 QTC Calculation: 410 R Axis:   17 Text Interpretation:  Sinus rhythm Low voltage, precordial leads Abnormal R-wave progression, early transition Borderline repolarization abnormality SVT resolved Confirmed by Bentley Fissel MD, Gwen Sarvis [id number]) on 05/11/2016 1:42:14 PM       MDM   Final diagnoses:  SVT (supraventricular tachycardia) ([place])    Initially tried the revert maneuver but her SVT remained. Given adenosine and now her rhythm has converted to sinus rhythm. She never had symptoms of syncope, near-syncope, or anginal symptoms. Electrolytes unremarkable. This is a recurring issue for her. Discussed continued compliance with her metoprolol and follow-up with cardiology.     Sherwood Gambler, MD 05/11/16 [phone]

## 2016-05-11 NOTE — ED Notes (Addendum)
Pt. Vitals done, IV inserted, hooked to Zoll pads. RN and Regenia Skeeter MD gave adenosine. Pt. Converted from SVT to NSR. New EKG shot. Pt. C/o feeling a little tired at this time.

## 2016-05-11 NOTE — ED Notes (Signed)
Pt reports being in SVT. Hx of same and is scheduled for ablation on 31st. ekg done and HR 160 at triage.

## 2016-05-11 NOTE — ED Notes (Signed)
Regenia Skeeter, MD at bedside with patient; visitor at bedside also

## 2016-05-14 ENCOUNTER — Encounter (HOSPITAL_COMMUNITY): Payer: Medicare Other

## 2016-05-14 ENCOUNTER — Other Ambulatory Visit: Payer: Medicare Other | Admitting: *Deleted

## 2016-05-14 ENCOUNTER — Other Ambulatory Visit: Payer: Self-pay | Admitting: Family Medicine

## 2016-05-14 DIAGNOSIS — I471 Supraventricular tachycardia: Secondary | ICD-10-CM

## 2016-05-14 DIAGNOSIS — Z01812 Encounter for preprocedural laboratory examination: Secondary | ICD-10-CM

## 2016-05-14 LAB — CBC WITH DIFFERENTIAL/PLATELET
BASOS PCT: 0 %
Basophils Absolute: 0 cells/uL (ref 0–200)
Eosinophils Absolute: 168 cells/uL (ref 15–500)
Eosinophils Relative: 2 %
HEMATOCRIT: 41.4 % (ref 35.0–45.0)
HEMOGLOBIN: 13.7 g/dL (ref 11.7–15.5)
LYMPHS ABS: 1848 {cells}/uL (ref 850–3900)
LYMPHS PCT: 22 %
MCH: 27.2 pg (ref 27.0–33.0)
MCHC: 33.1 g/dL (ref 32.0–36.0)
MCV: 82.3 fL (ref 80.0–100.0)
MONO ABS: 840 {cells}/uL (ref 200–950)
MPV: 9.3 fL (ref 7.5–12.5)
Monocytes Relative: 10 %
NEUTROS ABS: 5544 {cells}/uL (ref 1500–7800)
Neutrophils Relative %: 66 %
Platelets: 268 10*3/uL (ref 140–400)
RBC: 5.03 MIL/uL (ref 3.80–5.10)
RDW: 16.4 % — AB (ref 11.0–15.0)
WBC: 8.4 10*3/uL (ref 3.8–10.8)

## 2016-05-14 LAB — BASIC METABOLIC PANEL
BUN: 16 mg/dL (ref 7–25)
CO2: 28 mmol/L (ref 20–31)
Calcium: 9.6 mg/dL (ref 8.6–10.4)
Chloride: 98 mmol/L (ref 98–110)
Creat: 0.66 mg/dL (ref 0.50–0.99)
Glucose, Bld: 85 mg/dL (ref 65–99)
POTASSIUM: 5.1 mmol/L (ref 3.5–5.3)
SODIUM: 133 mmol/L — AB (ref 135–146)

## 2016-05-14 NOTE — Telephone Encounter (Signed)
Call in #70 of the prednisone

## 2016-05-16 ENCOUNTER — Encounter (HOSPITAL_COMMUNITY): Payer: Medicare Other

## 2016-05-16 ENCOUNTER — Telehealth: Payer: Self-pay | Admitting: Cardiology

## 2016-05-16 NOTE — Telephone Encounter (Signed)
Patient asking if ok to take Prednisone prior to ablation. She breaks out with a rash on her leg occasionally and doctor prescribed Prednisone for it. Advised patient ok to take this, per Dr. Curt Bears. She thanks me for calling.

## 2016-05-16 NOTE — Telephone Encounter (Signed)
New Message  Have ablation on Monday morning. Needs to discuss drug prescribed yesterday by Dr. Sarajane Jews, pt wants to make sure it okay to take before surgery. Please follow up with pt. wy

## 2016-05-18 ENCOUNTER — Encounter (HOSPITAL_COMMUNITY): Payer: Medicare Other

## 2016-05-18 ENCOUNTER — Telehealth (HOSPITAL_COMMUNITY): Payer: Self-pay | Admitting: Cardiac Rehabilitation

## 2016-05-18 NOTE — Telephone Encounter (Signed)
-----   Message from [name], MD sent at 05/03/2016 10:15 AM EDT ----- Regarding: RE: cardiac rehab  It is unclear what is the trigger or if exercise would trigger AVNRT.  Would be OK to hold off on rehab until one week post ablation.  Thank you.  [name]  ----- Message -----    From: [name], RN    Sent: 05/02/2016   3:37 PM      To: [name], MD Subject: cardiac rehab                                  Dear Dr. Curt Bears,  Pt is scheduled to begin cardaic rehab 05/07/16.  She has AVNRT ablation scheduled with you 05/21/2016.  Is it appropriate for her to begin rehab before the ablation?   Are there any activity restrictions? Would exercise trigger SVT?   Thank you, [name], RN, BSN Cardiac Pulmonary Rehab

## 2016-05-21 ENCOUNTER — Encounter (HOSPITAL_COMMUNITY): Payer: Medicare Other

## 2016-05-21 ENCOUNTER — Encounter (HOSPITAL_COMMUNITY): Payer: Self-pay | Admitting: Cardiology

## 2016-05-21 ENCOUNTER — Encounter (HOSPITAL_COMMUNITY)
Admission: RE | Disposition: A | Discharge status: Home / Self Care | Payer: Self-pay | Source: Ambulatory Visit | Attending: Cardiology

## 2016-05-21 ENCOUNTER — Ambulatory Visit (HOSPITAL_COMMUNITY)
Admission: RE | Admit: 2016-05-21 | Discharge: 2016-05-22 | Disposition: A | Discharge status: Home / Self Care | Payer: Medicare Other | Source: Ambulatory Visit | Attending: Cardiology | Admitting: Cardiology

## 2016-05-21 DIAGNOSIS — E785 Hyperlipidemia, unspecified: Secondary | ICD-10-CM | POA: Insufficient documentation

## 2016-05-21 DIAGNOSIS — I471 Supraventricular tachycardia, unspecified: Secondary | ICD-10-CM | POA: Diagnosis present

## 2016-05-21 DIAGNOSIS — I4719 Other supraventricular tachycardia: Secondary | ICD-10-CM

## 2016-05-21 DIAGNOSIS — I2510 Atherosclerotic heart disease of native coronary artery without angina pectoris: Secondary | ICD-10-CM | POA: Diagnosis not present

## 2016-05-21 DIAGNOSIS — Z880 Allergy status to penicillin: Secondary | ICD-10-CM | POA: Insufficient documentation

## 2016-05-21 DIAGNOSIS — I10 Essential (primary) hypertension: Secondary | ICD-10-CM | POA: Insufficient documentation

## 2016-05-21 DIAGNOSIS — K219 Gastro-esophageal reflux disease without esophagitis: Secondary | ICD-10-CM | POA: Diagnosis not present

## 2016-05-21 HISTORY — PX: ELECTROPHYSIOLOGIC STUDY: SHX172A

## 2016-05-21 SURGERY — A-FLUTTER/A-TACH/SVT ABLATION

## 2016-05-21 MED ORDER — PREDNISONE 10 MG PO TABS
10.0000 mg | ORAL_TABLET | Freq: Every day | ORAL | Status: DC
  Administered 2016-05-22: 10 mg via ORAL
  Filled 2016-05-21: qty 1

## 2016-05-21 MED ORDER — FENTANYL CITRATE (PF) 100 MCG/2ML IJ SOLN
INTRAMUSCULAR | Status: AC
  Filled 2016-05-21: qty 2

## 2016-05-21 MED ORDER — PANTOPRAZOLE SODIUM 40 MG PO TBEC
40.0000 mg | DELAYED_RELEASE_TABLET | Freq: Every day | ORAL | Status: DC
  Administered 2016-05-22: 40 mg via ORAL
  Filled 2016-05-21: qty 1

## 2016-05-21 MED ORDER — MIDAZOLAM HCL 5 MG/5ML IJ SOLN
INTRAMUSCULAR | Status: AC
  Filled 2016-05-21: qty 5

## 2016-05-21 MED ORDER — ONDANSETRON HCL 4 MG/2ML IJ SOLN: 4.0000 mg | Freq: Four times a day (QID) | INTRAMUSCULAR | Status: DC | PRN

## 2016-05-21 MED ORDER — ALBUTEROL SULFATE (2.5 MG/3ML) 0.083% IN NEBU: 3.0000 mL | INHALATION_SOLUTION | RESPIRATORY_TRACT | Status: DC | PRN

## 2016-05-21 MED ORDER — SODIUM CHLORIDE 0.9% FLUSH: 3.0000 mL | INTRAVENOUS | Status: DC | PRN

## 2016-05-21 MED ORDER — METOPROLOL SUCCINATE ER 25 MG PO TB24
25.0000 mg | ORAL_TABLET | Freq: Two times a day (BID) | ORAL | Status: DC
Start: 2016-05-21 — End: 2016-05-22
  Administered 2016-05-21 – 2016-05-22 (×2): 25 mg via ORAL
  Filled 2016-05-21 (×2): qty 1

## 2016-05-21 MED ORDER — SODIUM CHLORIDE 0.9 % IV SOLN
INTRAVENOUS | Status: DC | PRN
  Administered 2016-05-21: 50 mL/h via INTRAVENOUS

## 2016-05-21 MED ORDER — HEPARIN (PORCINE) IN NACL 2-0.9 UNIT/ML-% IJ SOLN
INTRAMUSCULAR | Status: AC
  Filled 2016-05-21: qty 500

## 2016-05-21 MED ORDER — ADULT MULTIVITAMIN LIQUID CH
15.0000 mL | Freq: Every day | ORAL | Status: DC
  Administered 2016-05-22: 15 mL via ORAL
  Filled 2016-05-21: qty 15

## 2016-05-21 MED ORDER — BUPIVACAINE HCL (PF) 0.25 % IJ SOLN
INTRAMUSCULAR | Status: AC
  Filled 2016-05-21: qty 30

## 2016-05-21 MED ORDER — NITROGLYCERIN 0.4 MG SL SUBL: 0.4000 mg | SUBLINGUAL_TABLET | SUBLINGUAL | Status: DC | PRN

## 2016-05-21 MED ORDER — HEPARIN (PORCINE) IN NACL 2-0.9 UNIT/ML-% IJ SOLN
INTRAMUSCULAR | Status: DC | PRN
Start: 2016-05-21 — End: 2016-05-21
  Administered 2016-05-21: 13:00:00

## 2016-05-21 MED ORDER — FENTANYL CITRATE (PF) 100 MCG/2ML IJ SOLN
INTRAMUSCULAR | Status: DC | PRN
  Administered 2016-05-21 (×10): 25 ug via INTRAVENOUS

## 2016-05-21 MED ORDER — SODIUM CHLORIDE 0.9 % IV SOLN: 250.0000 mL | INTRAVENOUS | Status: DC | PRN

## 2016-05-21 MED ORDER — BUPIVACAINE HCL (PF) 0.25 % IJ SOLN
INTRAMUSCULAR | Status: DC | PRN
  Administered 2016-05-21: 40 mL

## 2016-05-21 MED ORDER — ACETAMINOPHEN 325 MG PO TABS
650.0000 mg | ORAL_TABLET | ORAL | Status: DC | PRN
  Filled 2016-05-21: qty 2

## 2016-05-21 MED ORDER — ROSUVASTATIN CALCIUM 10 MG PO TABS
10.0000 mg | ORAL_TABLET | Freq: Every day | ORAL | Status: DC
  Administered 2016-05-22: 10 mg via ORAL
  Filled 2016-05-21: qty 1

## 2016-05-21 MED ORDER — RAMIPRIL 10 MG PO CAPS
10.0000 mg | ORAL_CAPSULE | Freq: Every day | ORAL | Status: DC
Start: 2016-05-22 — End: 2016-05-22
  Administered 2016-05-22: 10 mg via ORAL
  Filled 2016-05-21: qty 1

## 2016-05-21 MED ORDER — SODIUM CHLORIDE 0.9 % IV SOLN
INTRAVENOUS | Status: DC | PRN
  Administered 2016-05-21: 2 ug/min via INTRAVENOUS

## 2016-05-21 MED ORDER — MULTIVITAMIN PO LIQD: Freq: Every day | ORAL | Status: DC

## 2016-05-21 MED ORDER — LORATADINE 10 MG PO TABS
10.0000 mg | ORAL_TABLET | Freq: Every day | ORAL | Status: DC
  Administered 2016-05-22: 10 mg via ORAL
  Filled 2016-05-21: qty 1

## 2016-05-21 MED ORDER — ISOPROTERENOL HCL 0.2 MG/ML IJ SOLN
INTRAMUSCULAR | Status: AC
  Filled 2016-05-21: qty 5

## 2016-05-21 MED ORDER — MIDAZOLAM HCL 5 MG/5ML IJ SOLN
INTRAMUSCULAR | Status: DC | PRN
  Administered 2016-05-21 (×10): 1 mg via INTRAVENOUS

## 2016-05-21 MED ORDER — SODIUM CHLORIDE 0.9% FLUSH
3.0000 mL | Freq: Two times a day (BID) | INTRAVENOUS | Status: DC
  Administered 2016-05-21 – 2016-05-22 (×2): 3 mL via INTRAVENOUS

## 2016-05-21 SURGICAL SUPPLY — 14 items
BAG SNAP BAND KOVER 36X36 (MISCELLANEOUS) ×2 IMPLANT
CATH EZ STEER NAV 4MM D-F CUR (ABLATOR) ×1 IMPLANT
CATH JOSEPH QUAD ALLRED 6F REP (CATHETERS) ×2 IMPLANT
CATH WEBSTER BI DIR CS D-F CRV (CATHETERS) ×1 IMPLANT
INTRODUCER SWARTZ SRO 8F (SHEATH) ×1 IMPLANT
PACK EP LATEX FREE (CUSTOM PROCEDURE TRAY) ×2
PACK EP LF (CUSTOM PROCEDURE TRAY) ×1 IMPLANT
PAD DEFIB LIFELINK (PAD) ×2 IMPLANT
PATCH CARTO3 (PAD) ×1 IMPLANT
SHEATH PINNACLE 6F 10CM (SHEATH) ×2 IMPLANT
SHEATH PINNACLE 7F 10CM (SHEATH) ×1 IMPLANT
SHEATH PINNACLE 8F 10CM (SHEATH) ×1 IMPLANT
SHEATH PINNACLE 9F 10CM (SHEATH) ×1 IMPLANT
SHIELD RADPAD SCOOP 12X17 (MISCELLANEOUS) ×2 IMPLANT

## 2016-05-21 NOTE — Progress Notes (Signed)
Site area: rt groin Site Prior to Removal:  Level 0 Pressure Applied For:  10 minutes Manual:   yes Patient Status During Pull:  stable Post Pull Site:  Level  0 Post Pull Instructions Given:  yes Post Pull Pulses Present: yes Dressing Applied:  Small tegaderm Bedrest begins @ 1625 Comments:  IV  Saline locked

## 2016-05-21 NOTE — Discharge Summary (Signed)
ELECTROPHYSIOLOGY PROCEDURE DISCHARGE SUMMARY    Patient ID: Natalie Hall,  MRN: [id number], DOB/AGE: February 17, 1951 65 y.o.  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  Hypertension 2.  Hyperlipidemia 3.  CAD  4.  GERD  Allergies  Allergen Reactions  . Codeine Other (See Comments)    flushing  . Levaquin [Levofloxacin In D5w] Other (See Comments)    Leg pain   . Augmentin [Amoxicillin-Pot Clavulanate] Rash  . Lexapro [Escitalopram Oxalate] Rash     Procedures This Admission: 1.  Electrophysiology study and radiofrequency catheter ablation on 05/21/16 by Dr Natalie Hall.  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  There were no inducible arrhythmias following ablation and no early apparent complications.   Brief HPI: Natalie Hall is a 65 y.o. female with a past medical history as outlined above.  She has had increasing tachypalpitations with documented SVT.  They have failed medical therapy with Metoprolol.  Risks, benefits, and alternatives to ablation were reviewed with the patient who wished to proceed.   Hall Course:  The patient was admitted and underwent EPS/RFCA with details as outlined above. She was monitored on telemetry overnight which demonstrated sinus rhythm.  Groin incision was without complication.  They were examined and considered stable for discharge to home.  Follow up Natalie Hall be arranged in 4 weeks.  Wound care and restrictions were reviewed with the patient prior to discharge.   Physical Exam: Vitals:   05/21/16 1900 05/21/16 2111 05/21/16 2358 05/22/16 0626  BP: (!) 118/51 (!) 109/48 (!) 141/57 (!) 121/59  Pulse: 71 75 (!) 59 (!) 57  Resp:  18 18 18   Temp:  97.9 F (36.6 C)  97.6 F (36.4 C) 97.5 F (36.4 C)  TempSrc:  Oral Oral Oral  SpO2:  97% 100% 99%  Weight:    201 lb (91.2 kg)  Height:        GEN- The patient is elderly appearing, alert and oriented x 3 today.   HEENT: normocephalic, atraumatic; sclera clear, conjunctiva pink; hearing intact; oropharynx clear; neck supple  Lungs- Clear to ausculation bilaterally, normal work of breathing.  No wheezes, rales, rhonchi Heart- Regular rate and rhythm  GI- soft, non-tender, non-distended, bowel sounds present  Extremities- no clubbing, cyanosis, or edema; DP/PT/radial pulses 2+ bilaterally, groin without hematoma/bruit MS- no significant deformity or atrophy Skin- warm and dry, no rash or lesion Psych- euthymic mood, full affect Neuro- strength and sensation are intact   Discharge Vitals: Blood pressure (!) 121/59, pulse (!) 57, temperature 97.5 F (36.4 C), temperature source Oral, resp. rate 18, height 5\' 1"  (1.549 m), weight 201 lb (91.2 kg), SpO2 99 %.    Labs:   Lab Results  Component Value Date   WBC 8.4 05/14/2016   HGB 13.7 05/14/2016   HCT 41.4 05/14/2016   MCV 82.3 05/14/2016   PLT 268 05/14/2016   No results for input(s): NA, K, CL, CO2, BUN, CREATININE, CALCIUM, PROT, BILITOT, ALKPHOS, ALT, AST, GLUCOSE in the last 168 hours.  Invalid input(s): LABALBU  Discharge Medications:    Medication List    TAKE these medications   albuterol 108 (90 Base) MCG/ACT inhaler Commonly known as:  PROVENTIL HFA;VENTOLIN HFA Inhale 2 puffs into the lungs every 4 (four) hours as needed for wheezing  or shortness of breath.   AMBULATORY NON FORMULARY MEDICATION Take 90 mg by mouth 2 (two) times daily. Medication Name: BRILINTA 90 mg BID (TWILIGHT Research study PROVIDED)   AMBULATORY NON FORMULARY MEDICATION Take 81 mg by mouth daily. Medication Name: ASA 81 mg Daily (TWILIGHT Research Study PROVIDED)   CALCIUM PO Take 1 tablet by mouth daily.   loratadine 10 MG tablet Commonly known as:   CLARITIN Take 10 mg by mouth daily.   metoprolol succinate 25 MG 24 hr tablet Commonly known as:  TOPROL XL Take 25 mg twice a day What changed:  how much to take  how to take this  when to take this  additional instructions   MULTIVITAMIN PO Take 1 tablet by mouth daily.   nitroGLYCERIN 0.4 MG SL tablet Commonly known as:  NITROSTAT Place 1 tablet (0.4 mg total) under the tongue every 5 (five) minutes x 3 doses as needed for chest pain.   omeprazole 40 MG capsule Commonly known as:  PRILOSEC Take 1 capsule by mouth  daily   predniSONE 10 MG tablet Commonly known as:  DELTASONE TAKE 6 TABLETS FOR 3 DAYS 5 TABS X 3DAYS, 4 TABS X 3 DAYS, 3 TABS X 3 DAYS, 2 TABS X 3 DAYS, 1 TAB X   ramipril 10 MG capsule Commonly known as:  ALTACE Take 1 capsule (10 mg total) by mouth daily.   rosuvastatin 10 MG tablet Commonly known as:  CRESTOR Take 1 tablet by mouth  daily   triamcinolone 55 MCG/ACT nasal inhaler Commonly known as:  NASACORT Place 2 sprays into the nose daily.   VITAMIN D-3 PO Take 1 tablet by mouth daily.       Disposition:  Discharge Instructions    Diet - low sodium heart healthy    Complete by:  As directed   Increase activity slowly    Complete by:  As directed     Follow-up Information    Cina Klumpp Natalie Leeds, MD Follow up on 06/18/2016.   Specialty:  Cardiology Why:  at 3:30PM  Contact information: [place] [id number] [phone]           Duration of Discharge Encounter: Greater than 30 minutes including physician time.  Signed, [name], NP 05/22/2016 6:46 AM  I have seen and examined this patient with [name].  Agree with above, note added to reflect my findings.  On exam, regular rhythm, no murmurs, lungs clear.  Had ablation for AVNRT yesterday without issues.  No further tachycardia overnight.  Natalie Hall plan for discharge today with follow up in EP clinic in 1 month.    Natalie Hall M. [name]  MD 05/22/2016 7:40 AM

## 2016-05-21 NOTE — H&P (Signed)
Natalie Hall presents to the hospital today with a history of SVT.  She has been responsive to adenosine.  She presents for planned ablation of her SVT.  Regular rhythm, no murmurs, lungs clear. Risks and benefits explained.  Risks include, but are not limited to bleeding, tamponade, heart block, stroke.  She understands these risks and has agreed to the procedure.  Drury Ardizzone Curt Bears, MD 05/21/2016. 12:37 PM

## 2016-05-21 NOTE — Progress Notes (Signed)
Site area: lt groin - 2 fv sheaths Site Prior to Removal:  Level 0 Pressure Applied For:  10 minutes Manual:   yes Patient Status During Pull:  stable Post Pull Site:  Level  0 Post Pull Instructions Given:  yes Post Pull Pulses Present: yes Dressing Applied:  Small tegaderm Bedrest begins @  Comments:

## 2016-05-22 ENCOUNTER — Encounter (HOSPITAL_COMMUNITY): Payer: Self-pay | Admitting: Cardiology

## 2016-05-22 DIAGNOSIS — I2510 Atherosclerotic heart disease of native coronary artery without angina pectoris: Secondary | ICD-10-CM | POA: Diagnosis not present

## 2016-05-22 DIAGNOSIS — I471 Supraventricular tachycardia: Secondary | ICD-10-CM | POA: Diagnosis not present

## 2016-05-22 DIAGNOSIS — I10 Essential (primary) hypertension: Secondary | ICD-10-CM | POA: Diagnosis not present

## 2016-05-22 DIAGNOSIS — E785 Hyperlipidemia, unspecified: Secondary | ICD-10-CM | POA: Diagnosis not present

## 2016-05-22 MED ORDER — TICAGRELOR 90 MG PO TABS
90.0000 mg | ORAL_TABLET | Freq: Once | ORAL | Status: AC
  Administered 2016-05-22: 90 mg via ORAL
  Filled 2016-05-22: qty 1

## 2016-05-22 MED FILL — Midazolam HCl Inj 5 MG/5ML (Base Equivalent): INTRAMUSCULAR | Qty: 5 | Status: AC

## 2016-05-22 NOTE — Progress Notes (Signed)
Pt refused have bed alarm turned on.

## 2016-05-22 NOTE — Progress Notes (Signed)
Pt is participating in [place] order for this admission has expired.  Since pt is being discharged, pt states that she will take Brilinta when she gets home.  Educated pt on importance of not missing a dose, and she is aware.

## 2016-05-22 NOTE — Progress Notes (Signed)
Orders received for pt discharge.  Discharge summary printed and reviewed with pt.  Explained medication regimen, and pt had no further questions at this time.  IV removed and site remains clean, dry, intact.  Telemetry removed.  Pt in stable condition and awaiting transport. 

## 2016-05-22 NOTE — Progress Notes (Signed)
Dr. Percival Spanish called about patient being in the [facility] and the patient was concerned about not receiving her dose tonight. Order given by Dr. Percival Spanish to give dose of Brilinta and pharmacy notified. Informed patient to bring her home medication to be distributed by pharmacy.

## 2016-05-23 ENCOUNTER — Encounter (HOSPITAL_COMMUNITY): Payer: Medicare Other

## 2016-05-24 ENCOUNTER — Encounter (HOSPITAL_COMMUNITY): Payer: Self-pay

## 2016-05-24 ENCOUNTER — Encounter (HOSPITAL_COMMUNITY)
Admission: RE | Admit: 2016-05-24 | Discharge: 2016-05-24 | Disposition: A | Discharge status: Home / Self Care | Payer: Medicare Other | Source: Ambulatory Visit | Attending: Internal Medicine | Admitting: Internal Medicine

## 2016-05-24 ENCOUNTER — Encounter: Payer: Self-pay | Admitting: Internal Medicine

## 2016-05-24 VITALS — BP 112/62 | HR 62 | Ht 65.0 in | Wt 202.8 lb

## 2016-05-24 DIAGNOSIS — Z955 Presence of coronary angioplasty implant and graft: Secondary | ICD-10-CM | POA: Diagnosis present

## 2016-05-24 DIAGNOSIS — I214 Non-ST elevation (NSTEMI) myocardial infarction: Secondary | ICD-10-CM | POA: Insufficient documentation

## 2016-05-24 NOTE — Progress Notes (Signed)
Cardiac Individual Treatment Plan  Patient Details  Name: Natalie Hall MRN: [id number] Date of Birth: 1951-08-15 Referring Provider:   Flowsheet Row CARDIAC REHAB PHASE II ORIENTATION from 05/24/2016 in [place]  Referring Provider  Lyman Bishop      Initial Encounter Date:  [place] PHASE II ORIENTATION from 05/24/2016 in [place]  Date  05/24/16  Referring Provider  Lyman Bishop      Visit Diagnosis: 03/14/16 Status post coronary artery stent placement  Patient's Home Medications on Admission:  Current Outpatient Prescriptions:  .  AMBULATORY NON FORMULARY MEDICATION, Take 90 mg by mouth 2 (two) times daily. Medication Name: BRILINTA 90 mg BID (TWILIGHT Research study PROVIDED), Disp: , Rfl:  .  AMBULATORY NON FORMULARY MEDICATION, Take 81 mg by mouth daily. Medication Name: ASA 81 mg Daily (TWILIGHT Research Study PROVIDED), Disp: , Rfl:  .  CALCIUM PO, Take 1 tablet by mouth daily. , Disp: , Rfl:  .  Cholecalciferol (VITAMIN D-3 PO), Take 1 tablet by mouth daily. , Disp: , Rfl:  .  loratadine (CLARITIN) 10 MG tablet, Take 10 mg by mouth daily., Disp: , Rfl:  .  metoprolol succinate (TOPROL XL) 25 MG 24 hr tablet, Take 25 mg twice a day (Patient taking differently: Take 25 mg by mouth 2 (two) times daily. Take 25 mg twice a day), Disp: 180 tablet, Rfl: 3 .  Multiple Vitamins-Minerals (MULTIVITAMIN PO), Take 1 tablet by mouth daily. , Disp: , Rfl:  .  nitroGLYCERIN (NITROSTAT) 0.4 MG SL tablet, Place 1 tablet (0.4 mg total) under the tongue every 5 (five) minutes x 3 doses as needed for chest pain., Disp: 25 tablet, Rfl: 3 .  omeprazole (PRILOSEC) 40 MG capsule, Take 1 capsule by mouth  daily, Disp: 90 capsule, Rfl: 0 .  ramipril (ALTACE) 10 MG capsule, Take 1 capsule (10 mg total) by mouth daily., Disp: 90 capsule, Rfl: 3 .  rosuvastatin (CRESTOR) 10 MG tablet, Take 1 tablet by mouth  daily,  Disp: 90 tablet, Rfl: 0 .  triamcinolone (NASACORT) 55 MCG/ACT nasal inhaler, Place 2 sprays into the nose daily., Disp: 3 Inhaler, Rfl: 3  Past Medical History: Past Medical History:  Diagnosis Date  . Asthma    infrequent problem  . Choroidal nevus of left eye    sees Dr. Gerarda Fraction at [facility].   . GERD (gastroesophageal reflux disease)   . Hx of colonic polyp   . Hyperlipidemia   . Hypertension   . Osteoarthritis   . Supraventricular tachycardia (HCC)     Tobacco Use: History  Smoking Status  . Never Smoker  Smokeless Tobacco  . Never Used    Labs: Recent Review Flowsheet Data    Labs for ITP Cardiac and Pulmonary Rehab Latest Ref Rng & Units 01/26/2014 01/27/2015 02/07/2016 03/12/2016 05/11/2016   Cholestrol 0 - 200 mg/dL 161 158 169 128 -   LDLCALC 0 - 99 mg/dL 97 101(H) 99 66 -   LDLDIRECT mg/dL - - - - -   HDL >40 mg/dL 30.20(L) 28.60(L) 32.50(L) 20(L) -   Trlycerides <150 mg/dL 167.0(H) 143.0 186.0(H) 210(H) -   TCO2 0 - 100 mmol/L - - - - 22      Capillary Blood Glucose: No results found for: GLUCAP   Exercise Target Goals: Date: 05/24/16  Exercise Program Goal: Individual exercise prescription set with THRR, safety & activity barriers. Participant demonstrates ability to understand  and report RPE using BORG scale, to self-measure pulse accurately, and to acknowledge the importance of the exercise prescription.  Exercise Prescription Goal: Starting with aerobic activity 30 plus minutes a day, 3 days per week for initial exercise prescription. Provide home exercise prescription and guidelines that participant acknowledges understanding prior to discharge.  Activity Barriers & Risk Stratification:     Activity Barriers & Cardiac Risk Stratification - 05/24/16 0921      Activity Barriers & Cardiac Risk Stratification   Activity Barriers Left Knee Replacement;Other (comment)   Comments L achilles repair in 2012; R knee pain and L shoulder pain    Cardiac Risk Stratification High      6 Minute Walk:     6 Minute Walk    Row Name 05/24/16 1613         6 Minute Walk   Phase Initial     Distance 800 feet     Walk Time 6 minutes     # of Rest Breaks 0     MPH 1.5     METS 1.7     RPE 12     VO2 Peak 5.99     Symptoms No     Resting HR 62 bpm     Resting BP 112/62     Max Ex. HR 87 bpm     Max Ex. BP 126/70     2 Minute Post BP 106/72        Initial Exercise Prescription:     Initial Exercise Prescription - 05/24/16 1600      Date of Initial Exercise RX and Referring Provider   Date 05/24/16   Referring Provider Lyman Bishop     Recumbant Bike   Level 1.5   Minutes 10   METs 1.5     NuStep   Level 2   Minutes 10   METs 1.5     Arm Ergometer   Level 1.5   Minutes 10   METs 1.5     Prescription Details   Frequency (times per week) 3   Duration Progress to 30 minutes of continuous aerobic without signs/symptoms of physical distress     Intensity   THRR 40-80% of Max Heartrate 62-123   Ratings of Perceived Exertion 11-13   Perceived Dyspnea 0-4     Progression   Progression Continue to progress workloads to maintain intensity without signs/symptoms of physical distress.     Resistance Training   Training Prescription Yes   Weight 1lb      Perform Capillary Blood Glucose checks as needed.  Exercise Prescription Changes:   Exercise Comments:   Discharge Exercise Prescription (Final Exercise Prescription Changes):   Nutrition:  Target Goals: Understanding of nutrition guidelines, daily intake of sodium 1500mg , cholesterol 200mg , calories 30% from fat and 7% or less from saturated fats, daily to have 5 or more servings of fruits and vegetables.  Biometrics:     Pre Biometrics - 05/24/16 1618      Pre Biometrics   Waist Circumference 44.5 inches   Hip Circumference 46 inches   Waist to Hip Ratio 0.97 %   Triceps Skinfold 37 mm   % Body Fat 46.8 %   Grip Strength 31 kg    Flexibility 9 in   Single Leg Stand 19.84 seconds       Nutrition Therapy Plan and Nutrition Goals:   Nutrition Discharge: Nutrition Scores:   Nutrition Goals Re-Evaluation:   Psychosocial: Target Goals: Acknowledge presence or absence  of depression, maximize coping skills, provide positive support system. Participant is able to verbalize types and ability to use techniques and skills needed for reducing stress and depression.  Initial Review & Psychosocial Screening:   Quality of Life Scores:     Quality of Life - 05/24/16 1617      Quality of Life Scores   Health/Function Pre 18.53 %   Socioeconomic Pre 23.36 %   Psych/Spiritual Pre 20.57 %   Family Pre 16.8 %   GLOBAL Pre 19.69 %      PHQ-9: Recent Review Flowsheet Data    Depression screen PHQ 2/9 07/13/2015   Decreased Interest 0   Down, Depressed, Hopeless 0   PHQ - 2 Score 0      Psychosocial Evaluation and Intervention:   Psychosocial Re-Evaluation:   Vocational Rehabilitation: Provide vocational rehab assistance to qualifying candidates.   Vocational Rehab Evaluation & Intervention:     Vocational Rehab - 05/24/16 1815      Initial Vocational Rehab Evaluation & Intervention   Assessment shows need for Vocational Rehabilitation No  Pt does not plan to return to competive employment      Education: Education Goals: Education classes will be provided on a weekly basis, covering required topics. Participant will state understanding/return demonstration of topics presented.  Learning Barriers/Preferences:     Learning Barriers/Preferences - 05/24/16 0920      Learning Barriers/Preferences   Learning Barriers Sight   Learning Preferences Skilled Demonstration      Education Topics: Count Your Pulse:  -Group instruction provided by verbal instruction, demonstration, patient participation and written materials to support subject.  Instructors address importance of being able to find your  pulse and how to count your pulse when at home without a heart monitor.  Patients get hands on experience counting their pulse with staff help and individually.   Heart Attack, Angina, and Risk Factor Modification:  -Group instruction provided by verbal instruction, video, and written materials to support subject.  Instructors address signs and symptoms of angina and heart attacks.    Also discuss risk factors for heart disease and how to make changes to improve heart health risk factors.   Functional Fitness:  -Group instruction provided by verbal instruction, demonstration, patient participation, and written materials to support subject.  Instructors address safety measures for doing things around the house.  Discuss how to get up and down off the floor, how to pick things up properly, how to safely get out of a chair without assistance, and balance training.   Meditation and Mindfulness:  -Group instruction provided by verbal instruction, patient participation, and written materials to support subject.  Instructor addresses importance of mindfulness and meditation practice to help reduce stress and improve awareness.  Instructor also leads participants through a meditation exercise.    Stretching for Flexibility and Mobility:  -Group instruction provided by verbal instruction, patient participation, and written materials to support subject.  Instructors lead participants through series of stretches that are designed to increase flexibility thus improving mobility.  These stretches are additional exercise for major muscle groups that are typically performed during regular warm up and cool down.   Hands Only CPR Anytime:  -Group instruction provided by verbal instruction, video, patient participation and written materials to support subject.  Instructors co-teach with AHA video for hands only CPR.  Participants get hands on experience with mannequins.   Nutrition I class: Heart Healthy  Eating:  -Group instruction provided by PowerPoint slides, verbal discussion, and written  materials to support subject matter. The instructor gives an explanation and review of the Therapeutic Lifestyle Changes diet recommendations, which includes a discussion on lipid goals, dietary fat, sodium, fiber, plant stanol/sterol esters, sugar, and the components of a well-balanced, healthy diet.   Nutrition II class: Lifestyle Skills:  -Group instruction provided by PowerPoint slides, verbal discussion, and written materials to support subject matter. The instructor gives an explanation and review of label reading, grocery shopping for heart health, heart healthy recipe modifications, and ways to make healthier choices when eating out.   Diabetes Question & Answer:  -Group instruction provided by PowerPoint slides, verbal discussion, and written materials to support subject matter. The instructor gives an explanation and review of diabetes co-morbidities, pre- and post-prandial blood glucose goals, pre-exercise blood glucose goals, signs, symptoms, and treatment of hypoglycemia and hyperglycemia, and foot care basics.   Diabetes Blitz:  -Group instruction provided by PowerPoint slides, verbal discussion, and written materials to support subject matter. The instructor gives an explanation and review of the physiology behind type 1 and type 2 diabetes, diabetes medications and rational behind using different medications, pre- and post-prandial blood glucose recommendations and Hemoglobin A1c goals, diabetes diet, and exercise including blood glucose guidelines for exercising safely.    Portion Distortion:  -Group instruction provided by PowerPoint slides, verbal discussion, written materials, and food models to support subject matter. The instructor gives an explanation of serving size versus portion size, changes in portions sizes over the last 20 years, and what consists of a serving from each food  group.   Stress Management:  -Group instruction provided by verbal instruction, video, and written materials to support subject matter.  Instructors review role of stress in heart disease and how to cope with stress positively.     Exercising on Your Own:  -Group instruction provided by verbal instruction, power point, and written materials to support subject.  Instructors discuss benefits of exercise, components of exercise, frequency and intensity of exercise, and end points for exercise.  Also discuss use of nitroglycerin and activating EMS.  Review options of places to exercise outside of rehab.  Review guidelines for sex with heart disease.   Cardiac Drugs I:  -Group instruction provided by verbal instruction and written materials to support subject.  Instructor reviews cardiac drug classes: antiplatelets, anticoagulants, beta blockers, and statins.  Instructor discusses reasons, side effects, and lifestyle considerations for each drug class.   Cardiac Drugs II:  -Group instruction provided by verbal instruction and written materials to support subject.  Instructor reviews cardiac drug classes: angiotensin converting enzyme inhibitors (ACE-I), angiotensin II receptor blockers (ARBs), nitrates, and calcium channel blockers.  Instructor discusses reasons, side effects, and lifestyle considerations for each drug class.   Anatomy and Physiology of the Circulatory System:  -Group instruction provided by verbal instruction, video, and written materials to support subject.  Reviews functional anatomy of heart, how it relates to various diagnoses, and what role the heart plays in the overall system.   Knowledge Questionnaire Score:     Knowledge Questionnaire Score - 05/24/16 1618      Knowledge Questionnaire Score   Pre Score 24/24      Core Components/Risk Factors/Patient Goals at Admission:     Personal Goals and Risk Factors at Admission - 05/24/16 0924      Core  Components/Risk Factors/Patient Goals on Admission    Weight Management Weight Loss;Yes   Intervention Weight Management: Develop a combined nutrition and exercise program designed to reach desired caloric  intake, while maintaining appropriate intake of nutrient and fiber, sodium and fats, and appropriate energy expenditure required for the weight goal.;Weight Management: Provide education and appropriate resources to help participant work on and attain dietary goals.;Weight Management/Obesity: Establish reasonable short term and long term weight goals.;Obesity: Provide education and appropriate resources to help participant work on and attain dietary goals.   Expected Outcomes Short Term: Continue to assess and modify interventions until short term weight is achieved;Weight Maintenance: Understanding of the daily nutrition guidelines, which includes 25-35% calories from fat, 7% or less cal from saturated fats, less than 200mg  cholesterol, less than 1.5gm of sodium, & 5 or more servings of fruits and vegetables daily;Weight Loss: Understanding of general recommendations for a balanced deficit meal plan, which promotes 1-2 lb weight loss per week and includes a negative energy balance of [phone] kcal/d;Understanding recommendations for meals to include 15-35% energy as protein, 25-35% energy from fat, 35-60% energy from carbohydrates, less than 200mg  of dietary cholesterol, 20-35 gm of total fiber daily;Understanding of distribution of calorie intake throughout the day with the consumption of 4-5 meals/snacks   Sedentary Yes   Intervention Provide advice, education, support and counseling about physical activity/exercise needs.;Develop an individualized exercise prescription for aerobic and resistive training based on initial evaluation findings, risk stratification, comorbidities and participant's personal goals.   Expected Outcomes Achievement of increased cardiorespiratory fitness and enhanced flexibility,  muscular endurance and strength shown through measurements of functional capacity and personal statement of participant.   Increase Strength and Stamina Yes   Intervention Provide advice, education, support and counseling about physical activity/exercise needs.;Develop an individualized exercise prescription for aerobic and resistive training based on initial evaluation findings, risk stratification, comorbidities and participant's personal goals.   Expected Outcomes Achievement of increased cardiorespiratory fitness and enhanced flexibility, muscular endurance and strength shown through measurements of functional capacity and personal statement of participant.   Improve shortness of breath with ADL's Yes   Intervention Provide education, individualized exercise plan and daily activity instruction to help decrease symptoms of SOB with activities of daily living.   Expected Outcomes Short Term: Achieves a reduction of symptoms when performing activities of daily living.   Hypertension Yes   Intervention Provide education on lifestyle modifcations including regular physical activity/exercise, weight management, moderate sodium restriction and increased consumption of fresh fruit, vegetables, and low fat dairy, alcohol moderation, and smoking cessation.;Monitor prescription use compliance.   Expected Outcomes Short Term: Continued assessment and intervention until BP is < 140/9mm HG in hypertensive participants. < 130/7mm HG in hypertensive participants with diabetes, heart failure or chronic kidney disease.;Long Term: Maintenance of blood pressure at goal levels.   Lipids Yes   Expected Outcomes Short Term: Participant states understanding of desired cholesterol values and is compliant with medications prescribed. Participant is following exercise prescription and nutrition guidelines.;Long Term: Cholesterol controlled with medications as prescribed, with individualized exercise RX and with personalized  nutrition plan. Value goals: LDL < 70mg , HDL > 40 mg.   Personal Goal Other Yes   Personal Goal short: Learn what to do for exercise and nutrition counseling long: maintaining an exercise routine and nutrition program (overall healthier lifestyle)   Intervention Provide nutrition counseling and exercise programming to improve cardiovascular fitness and overall quality of life   Expected Outcomes Pt will be able to live a healthier lifestlye and maintain exercise and nutrition program      Core Components/Risk Factors/Patient Goals Review:    Core Components/Risk Factors/Patient Goals at Discharge (Final Review):  ITP Comments:     ITP Comments    Row Name 05/24/16 [phone]           ITP Comments Dr. Fransico Him, Medical Director          Comments:  Pt in today for cardiac rehab orientation from 0800-1130.  As a part of the orientation, pt completed 6 minute walk test.  Pt tolerated well with no complaints of cp, sob or palpitations.   Monitor showed SR with no ectopy.  Pt eagerly anticipates participating in cardiac rehab. Cherre Huger, BSN

## 2016-05-24 NOTE — Progress Notes (Signed)
Cardiac Rehab Medication Review by a Pharmacist  Does the patient  feel that his/her medications are working for him/her?  yes  Has the patient been experiencing any side effects to the medications prescribed?  no  Does the patient measure his/her own blood pressure or blood glucose at home?  yes   Does the patient have any problems obtaining medications due to transportation or finances?   no  Understanding of regimen: good Understanding of indications: good Potential of compliance: good    Pharmacist comments: MW is a pleasant 65 yo f who presents to cardiac rehab without assistance . Patient complains of easy bruising but has not noticed any bleeding. Pt advised to watch out for bleeding in stool, urine, or hitting head. Patient expressed understanding.    Pasty Spillers D Pharmacy Resident 05/24/2016 9:50 AM

## 2016-05-25 ENCOUNTER — Encounter (HOSPITAL_COMMUNITY): Payer: Medicare Other

## 2016-05-28 ENCOUNTER — Encounter (HOSPITAL_COMMUNITY): Payer: Medicare Other

## 2016-05-28 ENCOUNTER — Encounter (HOSPITAL_COMMUNITY)
Admission: RE | Admit: 2016-05-28 | Discharge: 2016-05-28 | Disposition: A | Discharge status: Home / Self Care | Payer: Medicare Other | Source: Ambulatory Visit | Attending: Internal Medicine | Admitting: Internal Medicine

## 2016-05-28 DIAGNOSIS — I214 Non-ST elevation (NSTEMI) myocardial infarction: Secondary | ICD-10-CM | POA: Diagnosis not present

## 2016-05-28 DIAGNOSIS — Z955 Presence of coronary angioplasty implant and graft: Secondary | ICD-10-CM

## 2016-05-28 NOTE — Progress Notes (Signed)
Daily Session Note  Patient Details  Name: Natalie Hall MRN: [id number] Date of Birth: April 06, 1951 Referring Provider:   Flowsheet Row CARDIAC REHAB PHASE II ORIENTATION from 05/24/2016 in [place]  Referring Provider  Lyman Bishop      Encounter Date: 05/28/2016  Check In:     Session Check In - 05/28/16 1021      Check-In   Staff Present Luetta Nutting Fair, MS, ACSM RCEP, Exercise Physiologist;Carlette Wilber Oliphant, RN, Deland Pretty, MS, ACSM CEP, Exercise Physiologist;Maria Whitaker, RN, BSN   Supervising physician immediately available to respond to emergencies [facility] immediately available   Physician(s) Dr. Nehemiah Settle   Medication changes reported     No   Fall or balance concerns reported    No   Warm-up and Cool-down Performed as group-led instruction   Resistance Training Performed Yes   VAD Patient? No     Pain Assessment   Currently in Pain? No/denies   Multiple Pain Sites No      Capillary Blood Glucose: No results found for this or any previous visit (from the past 24 hour(s)).   Goals Met:  No report of cardiac concerns or symptoms  Goals Unmet:  Not Applicable  Comments: Pt started cardiac rehab today.  Pt tolerated light exercise without difficulty. VSS, telemetry-Sinus Rhythm, asymptomatic.  Medication list reconciled. Pt denies barriers to medicaiton compliance.  PSYCHOSOCIAL ASSESSMENT:  PHQ-0. Pt exhibits positive coping skills, hopeful outlook with supportive family. No psychosocial needs identified at this time, no psychosocial interventions necessary. Natalie Hall looks after her 50 year old mother who is at [facility] and her 35 year old mother in law   Pt enjoys working on renovating her home.   Pt oriented to exercise equipment and routine.    Understanding verbalized.Barnet Pall, RN,BSN 05/28/2016 5:31 PM   Dr. Fransico Him is Medical Director for Cardiac Rehab at [facility].

## 2016-05-30 ENCOUNTER — Encounter (HOSPITAL_COMMUNITY): Payer: Medicare Other

## 2016-05-30 ENCOUNTER — Encounter (HOSPITAL_COMMUNITY)
Admission: RE | Admit: 2016-05-30 | Discharge: 2016-05-30 | Disposition: A | Discharge status: Home / Self Care | Payer: Medicare Other | Source: Ambulatory Visit | Attending: Internal Medicine | Admitting: Internal Medicine

## 2016-05-30 DIAGNOSIS — I214 Non-ST elevation (NSTEMI) myocardial infarction: Secondary | ICD-10-CM | POA: Diagnosis not present

## 2016-05-30 DIAGNOSIS — Z955 Presence of coronary angioplasty implant and graft: Secondary | ICD-10-CM

## 2016-06-01 ENCOUNTER — Encounter (HOSPITAL_COMMUNITY): Payer: Medicare Other

## 2016-06-01 ENCOUNTER — Encounter (HOSPITAL_COMMUNITY)
Admission: RE | Admit: 2016-06-01 | Discharge: 2016-06-01 | Disposition: A | Discharge status: Home / Self Care | Payer: Medicare Other | Source: Ambulatory Visit | Attending: Internal Medicine | Admitting: Internal Medicine

## 2016-06-01 DIAGNOSIS — I214 Non-ST elevation (NSTEMI) myocardial infarction: Secondary | ICD-10-CM | POA: Diagnosis not present

## 2016-06-01 DIAGNOSIS — Z955 Presence of coronary angioplasty implant and graft: Secondary | ICD-10-CM

## 2016-06-04 ENCOUNTER — Other Ambulatory Visit: Payer: Self-pay | Admitting: Internal Medicine

## 2016-06-04 ENCOUNTER — Encounter (HOSPITAL_COMMUNITY): Payer: Medicare Other

## 2016-06-04 ENCOUNTER — Encounter (HOSPITAL_COMMUNITY)
Admission: RE | Admit: 2016-06-04 | Discharge: 2016-06-04 | Disposition: A | Discharge status: Home / Self Care | Payer: Medicare Other | Source: Ambulatory Visit | Attending: Internal Medicine | Admitting: Internal Medicine

## 2016-06-04 DIAGNOSIS — I214 Non-ST elevation (NSTEMI) myocardial infarction: Secondary | ICD-10-CM | POA: Diagnosis not present

## 2016-06-04 DIAGNOSIS — Z955 Presence of coronary angioplasty implant and graft: Secondary | ICD-10-CM

## 2016-06-04 NOTE — Progress Notes (Signed)
Reviewed home exercise guidelines with patient including endpoints, temperature precautions, target heart rate and rate of perceived exertion. Pt is using her recumbent bike at home and plans to walk as her mode of home exercise. Pt uses her RB 10 minutes, 3 days/week, encouraged patient to increase duration to 30 minutes either all at once or by doing 15 minutes twice a day and pt is amenable to this. Pt voices understanding of instructions given. Sol Passer, MS, ACSM CCEP

## 2016-06-06 ENCOUNTER — Encounter: Payer: Self-pay | Admitting: *Deleted

## 2016-06-06 ENCOUNTER — Encounter (HOSPITAL_COMMUNITY)
Admission: RE | Admit: 2016-06-06 | Discharge: 2016-06-06 | Disposition: A | Discharge status: Home / Self Care | Payer: Medicare Other | Source: Ambulatory Visit | Attending: Internal Medicine | Admitting: Internal Medicine

## 2016-06-06 ENCOUNTER — Encounter (HOSPITAL_COMMUNITY): Payer: Medicare Other

## 2016-06-06 ENCOUNTER — Other Ambulatory Visit: Payer: Self-pay | Admitting: *Deleted

## 2016-06-06 DIAGNOSIS — Z006 Encounter for examination for normal comparison and control in clinical research program: Secondary | ICD-10-CM

## 2016-06-06 DIAGNOSIS — I214 Non-ST elevation (NSTEMI) myocardial infarction: Secondary | ICD-10-CM | POA: Diagnosis not present

## 2016-06-06 DIAGNOSIS — Z955 Presence of coronary angioplasty implant and graft: Secondary | ICD-10-CM

## 2016-06-06 MED ORDER — AMBULATORY NON FORMULARY MEDICATION: 81.0000 mg | Freq: Every day | Status: DC

## 2016-06-06 NOTE — Progress Notes (Signed)
QUALITY OF LIFE SCORE REVIEW  Pt completed Quality of Life survey as a participant in Cardiac Rehab. Scores 21.0 or below are considered low. Pt score very low in several areas Overall 19.69, Health and Function 18.53, socioeconomic 23.36, physiological and spiritual 20.57, family 16.80. Patient quality of life slightly altered by physical constraints which limits ability to perform as prior to recent cardiac illness.  Natalie Hall is enjoying participating in phase 2 cardiac rehab and feels like she is getting stronger. Offered emotional support and reassurance.  Will continue to monitor and intervene as necessary.  Will forward quality of life questionnaire to Dr [facility] office for review.Barnet Pall, RN,BSN 06/07/2016 8:27 AM

## 2016-06-06 NOTE — Progress Notes (Signed)
TWILIGHT Research Study month 3 randomization visit completed. Patient c/o bruising easily that started a few months after discharge (Around 29/jun/17) this is ongoing. She has been compliant with ASA and brilinta . She also had an ablation on her heart about 2 weeks ago and didn't have any cessation of either medication. Today she is Randomized to ASA 81 m.g daily or PLACEBO. Dispensed bottle numbers [id number]. Questions encouraged and answered.

## 2016-06-07 NOTE — Progress Notes (Signed)
Cardiac Individual Treatment Plan  Patient Details  Name: Natalie Hall MRN: [id number] Date of Birth: 05/08/1951 Referring Provider:   Flowsheet Row CARDIAC REHAB PHASE II ORIENTATION from 05/24/2016 in [facility]  Referring Provider  Lyman Bishop      Initial Encounter Date:  [facility] PHASE II ORIENTATION from 05/24/2016 in [facility]  Date  05/24/16  Referring Provider  Lyman Bishop      Visit Diagnosis: 03/14/16 Status post coronary artery stent placement  Patient's Home Medications on Admission:  Current Outpatient Prescriptions:  .  AMBULATORY NON FORMULARY MEDICATION, Take 90 mg by mouth 2 (two) times daily. Medication Name: BRILINTA 90 mg BID (TWILIGHT Research study PROVIDED), Disp: , Rfl:  .  AMBULATORY NON FORMULARY MEDICATION, Take 81 mg by mouth daily. Medication Name: Aspirin 81 mg Daily or PLACEBO (Randomized in the TWILIGHT Research Study), Disp: , Rfl:  .  CALCIUM PO, Take 1 tablet by mouth daily. , Disp: , Rfl:  .  Cholecalciferol (VITAMIN D-3 PO), Take 1 tablet by mouth daily. , Disp: , Rfl:  .  loratadine (CLARITIN) 10 MG tablet, Take 10 mg by mouth daily., Disp: , Rfl:  .  metoprolol succinate (TOPROL XL) 25 MG 24 hr tablet, Take 25 mg twice a day (Patient taking differently: Take 25 mg by mouth 2 (two) times daily. Take 25 mg twice a day), Disp: 180 tablet, Rfl: 3 .  Multiple Vitamins-Minerals (MULTIVITAMIN PO), Take 1 tablet by mouth daily. , Disp: , Rfl:  .  nitroGLYCERIN (NITROSTAT) 0.4 MG SL tablet, Dissolve 1 tablet under the tongue every 5 minutes for  3 doses as needed for chest pain, Disp: 100 tablet, Rfl: 0 .  omeprazole (PRILOSEC) 40 MG capsule, Take 1 capsule by mouth  daily, Disp: 90 capsule, Rfl: 0 .  ramipril (ALTACE) 10 MG capsule, Take 1 capsule (10 mg total) by mouth daily., Disp: 90 capsule, Rfl: 3 .  rosuvastatin (CRESTOR) 10 MG tablet, Take 1 tablet by mouth   daily, Disp: 90 tablet, Rfl: 0 .  triamcinolone (NASACORT) 55 MCG/ACT nasal inhaler, Place 2 sprays into the nose daily., Disp: 3 Inhaler, Rfl: 3  Past Medical History: Past Medical History:  Diagnosis Date  . Asthma    infrequent problem  . Choroidal nevus of left eye    sees Dr. Gerarda Fraction at [facility].   . GERD (gastroesophageal reflux disease)   . Hx of colonic polyp   . Hyperlipidemia   . Hypertension   . Osteoarthritis   . Supraventricular tachycardia (HCC)     Tobacco Use: History  Smoking Status  . Never Smoker  Smokeless Tobacco  . Never Used    Labs: Recent Review Flowsheet Data    Labs for ITP Cardiac and Pulmonary Rehab Latest Ref Rng & Units 01/26/2014 01/27/2015 02/07/2016 03/12/2016 05/11/2016   Cholestrol 0 - 200 mg/dL 161 158 169 128 -   LDLCALC 0 - 99 mg/dL 97 101(H) 99 66 -   LDLDIRECT mg/dL - - - - -   HDL >40 mg/dL 30.20(L) 28.60(L) 32.50(L) 20(L) -   Trlycerides <150 mg/dL 167.0(H) 143.0 186.0(H) 210(H) -   TCO2 0 - 100 mmol/L - - - - 22      Capillary Blood Glucose: No results found for: GLUCAP   Exercise Target Goals:    Exercise Program Goal: Individual exercise prescription set with THRR, safety & activity barriers. Participant demonstrates ability to  understand and report RPE using BORG scale, to self-measure pulse accurately, and to acknowledge the importance of the exercise prescription.  Exercise Prescription Goal: Starting with aerobic activity 30 plus minutes a day, 3 days per week for initial exercise prescription. Provide home exercise prescription and guidelines that participant acknowledges understanding prior to discharge.  Activity Barriers & Risk Stratification:     Activity Barriers & Cardiac Risk Stratification - 05/24/16 0921      Activity Barriers & Cardiac Risk Stratification   Activity Barriers Left Knee Replacement;Other (comment)   Comments L achilles repair in 2012; R knee pain and L shoulder pain   Cardiac  Risk Stratification High      6 Minute Walk:     6 Minute Walk    Row Name 05/24/16 1613         6 Minute Walk   Phase Initial     Distance 800 feet     Walk Time 6 minutes     # of Rest Breaks 0     MPH 1.5     METS 1.7     RPE 12     VO2 Peak 5.99     Symptoms No     Resting HR 62 bpm     Resting BP 112/62     Max Ex. HR 87 bpm     Max Ex. BP 126/70     2 Minute Post BP 106/72        Initial Exercise Prescription:     Initial Exercise Prescription - 05/24/16 1600      Date of Initial Exercise RX and Referring Provider   Date 05/24/16   Referring Provider Lyman Bishop     Recumbant Bike   Level 1.5   Minutes 10   METs 1.5     NuStep   Level 2   Minutes 10   METs 1.5     Arm Ergometer   Level 1.5   Minutes 10   METs 1.5     Prescription Details   Frequency (times per week) 3   Duration Progress to 30 minutes of continuous aerobic without signs/symptoms of physical distress     Intensity   THRR 40-80% of Max Heartrate 62-123   Ratings of Perceived Exertion 11-13   Perceived Dyspnea 0-4     Progression   Progression Continue to progress workloads to maintain intensity without signs/symptoms of physical distress.     Resistance Training   Training Prescription Yes   Weight 1lb      Perform Capillary Blood Glucose checks as needed.  Exercise Prescription Changes:      Exercise Prescription Changes    Row Name 06/08/16 0900             Exercise Review   Progression Yes         Response to Exercise   Blood Pressure (Admit) 108/72       Blood Pressure (Exercise) 120/80       Blood Pressure (Exit) 134/72       Heart Rate (Admit) 75 bpm       Heart Rate (Exercise) 97 bpm       Heart Rate (Exit) 71 bpm       Rating of Perceived Exertion (Exercise) 12       Symptoms none       Comments Reviewed home exercise guidelines on 06/04/16.       Duration Progress to 30 minutes of continuous aerobic without signs/symptoms of  physical  distress       Intensity THRR unchanged         Progression   Progression Continue to progress workloads to maintain intensity without signs/symptoms of physical distress.       Average METs 2.1         Resistance Training   Training Prescription Yes       Weight 1lb       Reps 10-12         Recumbant Bike   Level 1.5       Minutes 10       METs 2.2         NuStep   Level 4       Minutes 10       METs 2.3         Arm Ergometer   Level -       Minutes -       METs -         Track   Laps 5       Minutes 10       METs 1.84         Home Exercise Plan   Plans to continue exercise at Home       Frequency Add 4 additional days to program exercise sessions.          Exercise Comments:      Exercise Comments    Row Name 06/04/16 1632           Exercise Comments Off to a good start with exercise. Reviewed home exercise guidelines with patient.          Discharge Exercise Prescription (Final Exercise Prescription Changes):     Exercise Prescription Changes - 06/08/16 0900      Exercise Review   Progression Yes     Response to Exercise   Blood Pressure (Admit) 108/72   Blood Pressure (Exercise) 120/80   Blood Pressure (Exit) 134/72   Heart Rate (Admit) 75 bpm   Heart Rate (Exercise) 97 bpm   Heart Rate (Exit) 71 bpm   Rating of Perceived Exertion (Exercise) 12   Symptoms none   Comments Reviewed home exercise guidelines on 06/04/16.   Duration Progress to 30 minutes of continuous aerobic without signs/symptoms of physical distress   Intensity THRR unchanged     Progression   Progression Continue to progress workloads to maintain intensity without signs/symptoms of physical distress.   Average METs 2.1     Resistance Training   Training Prescription Yes   Weight 1lb   Reps 10-12     Recumbant Bike   Level 1.5   Minutes 10   METs 2.2     NuStep   Level 4   Minutes 10   METs 2.3     Arm Ergometer   Level --   Minutes --   METs --      Track   Laps 5   Minutes 10   METs 1.84     Home Exercise Plan   Plans to continue exercise at Home   Frequency Add 4 additional days to program exercise sessions.      Nutrition:  Target Goals: Understanding of nutrition guidelines, daily intake of sodium 1500mg , cholesterol 200mg , calories 30% from fat and 7% or less from saturated fats, daily to have 5 or more servings of fruits and vegetables.  Biometrics:     Pre Biometrics - 05/24/16 [id number]  Pre Biometrics   Waist Circumference 44.5 inches   Hip Circumference 46 inches   Waist to Hip Ratio 0.97 %   Triceps Skinfold 37 mm   % Body Fat 46.8 %   Grip Strength 31 kg   Flexibility 9 in   Single Leg Stand 19.84 seconds       Nutrition Therapy Plan and Nutrition Goals:     Nutrition Therapy & Goals - 05/25/16 1230      Nutrition Therapy   Diet Therapeutic Lifestyle Change     Personal Nutrition Goals   Personal Goal #1 1-2 lb wt loss/week to a goal wt loss of 6-24 lb at graduation from [facility]   Expected Outcomes Short Term Goal: Understand basic principles of dietary content, such as calories, fat, sodium, cholesterol and nutrients.;Long Term Goal: Adherence to prescribed nutrition plan.      Nutrition Discharge: Nutrition Scores:     Nutrition Assessments - 05/25/16 1230      MEDFICTS Scores   Pre Score 12      Nutrition Goals Re-Evaluation:   Psychosocial: Target Goals: Acknowledge presence or absence of depression, maximize coping skills, provide positive support system. Participant is able to verbalize types and ability to use techniques and skills needed for reducing stress and depression.  Initial Review & Psychosocial Screening:     Initial Psych Review & Screening - 05/28/16 [place]? Yes   Comments --  [place] helps care for her elderly mother and her 76 year old mother in law.     Barriers   Psychosocial barriers  to participate in program There are no identifiable barriers or psychosocial needs.;The patient should benefit from training in stress management and relaxation.     Screening Interventions   Interventions Encouraged to exercise      Quality of Life Scores:     Quality of Life - 06/06/16 1016      Quality of Life Scores   Health/Function Pre --  concerned about recent heart event   Family Pre --  husband has heart problems wants son to loose weight   GLOBAL Pre --  Denies feeling depressed      PHQ-9: Recent Review Flowsheet Data    Depression screen [facility] 2/9 05/28/2016 07/13/2015   Decreased Interest 0 0   Down, Depressed, Hopeless 0 0   PHQ - 2 Score 0 0      Psychosocial Evaluation and Intervention:   Psychosocial Re-Evaluation:     Psychosocial Re-Evaluation    Row Name 06/07/16 0814 06/07/16 0830           Psychosocial Re-Evaluation   Interventions Encouraged to attend Cardiac Rehabilitation for the exercise Stress management education      Continued Psychosocial Services Needed No  -         Vocational Rehabilitation: Provide vocational rehab assistance to qualifying candidates.   Vocational Rehab Evaluation & Intervention:     Vocational Rehab - 05/28/16 1728      Initial Vocational Rehab Evaluation & Intervention   Assessment shows need for [place] just retired from working on July the first.      Education: Education Goals: Education classes will be provided on a weekly basis, covering required topics. Participant will state understanding/return demonstration of topics presented.  Learning Barriers/Preferences:     Learning Barriers/Preferences - 05/24/16 0920      Learning  Barriers/Preferences   Engineering geologist Preferences Skilled Demonstration      Education Topics: Count Your Pulse:  -Group instruction provided by verbal instruction, demonstration, patient participation and written  materials to support subject.  Instructors address importance of being able to find your pulse and how to count your pulse when at home without a heart monitor.  Patients get hands on experience counting their pulse with staff help and individually.   Heart Attack, Angina, and Risk Factor Modification:  -Group instruction provided by verbal instruction, video, and written materials to support subject.  Instructors address signs and symptoms of angina and heart attacks.    Also discuss risk factors for heart disease and how to make changes to improve heart health risk factors.   Functional Fitness:  -Group instruction provided by verbal instruction, demonstration, patient participation, and written materials to support subject.  Instructors address safety measures for doing things around the house.  Discuss how to get up and down off the floor, how to pick things up properly, how to safely get out of a chair without assistance, and balance training.   Meditation and Mindfulness:  -Group instruction provided by verbal instruction, patient participation, and written materials to support subject.  Instructor addresses importance of mindfulness and meditation practice to help reduce stress and improve awareness.  Instructor also leads participants through a meditation exercise.    Stretching for Flexibility and Mobility:  -Group instruction provided by verbal instruction, patient participation, and written materials to support subject.  Instructors lead participants through series of stretches that are designed to increase flexibility thus improving mobility.  These stretches are additional exercise for major muscle groups that are typically performed during regular warm up and cool down.   Hands Only CPR Anytime:  -Group instruction provided by verbal instruction, video, patient participation and written materials to support subject.  Instructors co-teach with AHA video for hands only CPR.   Participants get hands on experience with mannequins.   Nutrition I class: Heart Healthy Eating:  -Group instruction provided by PowerPoint slides, verbal discussion, and written materials to support subject matter. The instructor gives an explanation and review of the Therapeutic Lifestyle Changes diet recommendations, which includes a discussion on lipid goals, dietary fat, sodium, fiber, plant stanol/sterol esters, sugar, and the components of a well-balanced, healthy diet.   Nutrition II class: Lifestyle Skills:  -Group instruction provided by PowerPoint slides, verbal discussion, and written materials to support subject matter. The instructor gives an explanation and review of label reading, grocery shopping for heart health, heart healthy recipe modifications, and ways to make healthier choices when eating out.   Diabetes Question & Answer:  -Group instruction provided by PowerPoint slides, verbal discussion, and written materials to support subject matter. The instructor gives an explanation and review of diabetes co-morbidities, pre- and post-prandial blood glucose goals, pre-exercise blood glucose goals, signs, symptoms, and treatment of hypoglycemia and hyperglycemia, and foot care basics.   Diabetes Blitz:  -Group instruction provided by PowerPoint slides, verbal discussion, and written materials to support subject matter. The instructor gives an explanation and review of the physiology behind type 1 and type 2 diabetes, diabetes medications and rational behind using different medications, pre- and post-prandial blood glucose recommendations and Hemoglobin A1c goals, diabetes diet, and exercise including blood glucose guidelines for exercising safely.    Portion Distortion:  -Group instruction provided by PowerPoint slides, verbal discussion, written materials, and food models to support subject matter. The instructor gives an explanation  of serving size versus portion size, changes in  portions sizes over the last 20 years, and what consists of a serving from each food group. Flowsheet Row CARDIAC REHAB PHASE II EXERCISE from 06/06/2016 in [facility]  Date  05/30/16  Educator  RD  Instruction Review Code  2- meets goals/outcomes      Stress Management:  -Group instruction provided by verbal instruction, video, and written materials to support subject matter.  Instructors review role of stress in heart disease and how to cope with stress positively.     Exercising on Your Own:  -Group instruction provided by verbal instruction, power point, and written materials to support subject.  Instructors discuss benefits of exercise, components of exercise, frequency and intensity of exercise, and end points for exercise.  Also discuss use of nitroglycerin and activating EMS.  Review options of places to exercise outside of rehab.  Review guidelines for sex with heart disease.   Cardiac Drugs I:  -Group instruction provided by verbal instruction and written materials to support subject.  Instructor reviews cardiac drug classes: antiplatelets, anticoagulants, beta blockers, and statins.  Instructor discusses reasons, side effects, and lifestyle considerations for each drug class. Flowsheet Row CARDIAC REHAB PHASE II EXERCISE from 06/06/2016 in [facility]  Date  06/06/16  Educator  pharmD   Instruction Review Code  2- meets goals/outcomes      Cardiac Drugs II:  -Group instruction provided by verbal instruction and written materials to support subject.  Instructor reviews cardiac drug classes: angiotensin converting enzyme inhibitors (ACE-I), angiotensin II receptor blockers (ARBs), nitrates, and calcium channel blockers.  Instructor discusses reasons, side effects, and lifestyle considerations for each drug class.   Anatomy and Physiology of the Circulatory System:  -Group instruction provided by verbal instruction,  video, and written materials to support subject.  Reviews functional anatomy of heart, how it relates to various diagnoses, and what role the heart plays in the overall system.   Knowledge Questionnaire Score:     Knowledge Questionnaire Score - 05/24/16 1618      Knowledge Questionnaire Score   Pre Score 24/24      Core Components/Risk Factors/Patient Goals at Admission:     Personal Goals and Risk Factors at Admission - 05/24/16 0924      Core Components/Risk Factors/Patient Goals on Admission    Weight Management Weight Loss;Yes   Intervention Weight Management: Develop a combined nutrition and exercise program designed to reach desired caloric intake, while maintaining appropriate intake of nutrient and fiber, sodium and fats, and appropriate energy expenditure required for the weight goal.;Weight Management: Provide education and appropriate resources to help participant work on and attain dietary goals.;Weight Management/Obesity: Establish reasonable short term and long term weight goals.;Obesity: Provide education and appropriate resources to help participant work on and attain dietary goals.   Expected Outcomes Short Term: Continue to assess and modify interventions until short term weight is achieved;Weight Maintenance: Understanding of the daily nutrition guidelines, which includes 25-35% calories from fat, 7% or less cal from saturated fats, less than 200mg  cholesterol, less than 1.5gm of sodium, & 5 or more servings of fruits and vegetables daily;Weight Loss: Understanding of general recommendations for a balanced deficit meal plan, which promotes 1-2 lb weight loss per week and includes a negative energy balance of [phone] kcal/d;Understanding recommendations for meals to include 15-35% energy as protein, 25-35% energy from fat, 35-60% energy from carbohydrates, less than 200mg  of dietary cholesterol, 20-35 gm  of total fiber daily;Understanding of distribution of calorie intake  throughout the day with the consumption of 4-5 meals/snacks   Sedentary Yes   Intervention Provide advice, education, support and counseling about physical activity/exercise needs.;Develop an individualized exercise prescription for aerobic and resistive training based on initial evaluation findings, risk stratification, comorbidities and participant's personal goals.   Expected Outcomes Achievement of increased cardiorespiratory fitness and enhanced flexibility, muscular endurance and strength shown through measurements of functional capacity and personal statement of participant.   Increase Strength and Stamina Yes   Intervention Provide advice, education, support and counseling about physical activity/exercise needs.;Develop an individualized exercise prescription for aerobic and resistive training based on initial evaluation findings, risk stratification, comorbidities and participant's personal goals.   Expected Outcomes Achievement of increased cardiorespiratory fitness and enhanced flexibility, muscular endurance and strength shown through measurements of functional capacity and personal statement of participant.   Improve shortness of breath with ADL's Yes   Intervention Provide education, individualized exercise plan and daily activity instruction to help decrease symptoms of SOB with activities of daily living.   Expected Outcomes Short Term: Achieves a reduction of symptoms when performing activities of daily living.   Hypertension Yes   Intervention Provide education on lifestyle modifcations including regular physical activity/exercise, weight management, moderate sodium restriction and increased consumption of fresh fruit, vegetables, and low fat dairy, alcohol moderation, and smoking cessation.;Monitor prescription use compliance.   Expected Outcomes Short Term: Continued assessment and intervention until BP is < 140/25mm HG in hypertensive participants. < 130/77mm HG in hypertensive  participants with diabetes, heart failure or chronic kidney disease.;Long Term: Maintenance of blood pressure at goal levels.   Lipids Yes   Expected Outcomes Short Term: Participant states understanding of desired cholesterol values and is compliant with medications prescribed. Participant is following exercise prescription and nutrition guidelines.;Long Term: Cholesterol controlled with medications as prescribed, with individualized exercise RX and with personalized nutrition plan. Value goals: LDL < 70mg , HDL > 40 mg.   Personal Goal Other Yes   Personal Goal short: Learn what to do for exercise and nutrition counseling long: maintaining an exercise routine and nutrition program (overall healthier lifestyle)   Intervention Provide nutrition counseling and exercise programming to improve cardiovascular fitness and overall quality of life   Expected Outcomes Pt will be able to live a healthier lifestlye and maintain exercise and nutrition program      Core Components/Risk Factors/Patient Goals Review:      Goals and Risk Factor Review    Row Name 06/04/16 1634             Core Components/Risk Factors/Patient Goals Review   Personal Goals Review Other       Review Home exercise guidelines given including stop signs, temperature guidelines, and heart rate and rate of perceived exertion parameters. Pt has recumbent bike at home, which she will use for her home exercise guidelines.       Expected Outcomes Exercise 3 dyas per week using recumbent bike and walking to achieve improved cardiorespiratory benefits.          Core Components/Risk Factors/Patient Goals at Discharge (Final Review):      Goals and Risk Factor Review - 06/04/16 1634      Core Components/Risk Factors/Patient Goals Review   Personal Goals Review Other   Review Home exercise guidelines given including stop signs, temperature guidelines, and heart rate and rate of perceived exertion parameters. Pt has recumbent bike  at home, which she will use for her  home exercise guidelines.   Expected Outcomes Exercise 3 dyas per week using recumbent bike and walking to achieve improved cardiorespiratory benefits.      ITP Comments:     ITP Comments    Row Name 05/24/16 [phone]           ITP Comments Dr. Fransico Him, Medical Director          Comments: Pt is making expected progress toward personal goals after completing 6 sessions. Recommend continued exercise and life style modification education including  stress management and relaxation techniques to decrease cardiac risk profile. [place] is enjoying participating in phase 2 cardiac rehab.Barnet Pall, RN,BSN 06/08/2016 9:52 AM

## 2016-06-08 ENCOUNTER — Encounter (HOSPITAL_COMMUNITY): Payer: Medicare Other

## 2016-06-08 ENCOUNTER — Encounter (HOSPITAL_COMMUNITY)
Admission: RE | Admit: 2016-06-08 | Discharge: 2016-06-08 | Disposition: A | Discharge status: Home / Self Care | Payer: Medicare Other | Source: Ambulatory Visit | Attending: Internal Medicine | Admitting: Internal Medicine

## 2016-06-08 DIAGNOSIS — Z955 Presence of coronary angioplasty implant and graft: Secondary | ICD-10-CM

## 2016-06-08 DIAGNOSIS — I214 Non-ST elevation (NSTEMI) myocardial infarction: Secondary | ICD-10-CM | POA: Diagnosis not present

## 2016-06-08 NOTE — Progress Notes (Signed)
Natalie Hall 65 y.o. female Nutrition Note Spoke with pt. Pt is obese. Per pt, she has lost 20 lb since her heart event. Pt feels she is at a wt loss plateau this week. Wt loss tips discussed. Nutrition Survey reviewed with pt. Pt is following Step 2 of the Therapeutic Lifestyle Changes diet. Pt expressed understanding of the information reviewed. Pt aware of nutrition education classes offered and plans on attending nutrition classes. No results found for: HGBA1C Wt Readings from Last 3 Encounters:  05/24/16 202 lb 13.2 oz (92 kg)  05/22/16 201 lb (91.2 kg)  04/30/16 204 lb 9.6 oz (92.8 kg)   Nutrition Diagnosis ? Food-and nutrition-related knowledge deficit related to lack of exposure to information as related to diagnosis of: ? CVD ? Obesity related to excessive energy intake as evidenced by a BMI of 33.8 Nutrition Intervention ? Benefits of adopting Therapeutic Lifestyle Changes discussed when Medficts reviewed. ? Pt to attend the Portion Distortion class ? Pt to attend the  ? Nutrition I class                        ? Nutrition II class ? Continue client-centered nutrition education by RD, as part of interdisciplinary care.  Goal(s) ? Pt to identify food quantities necessary to achieve weight loss of 6-24 lb (2.7-10.9 kg) at graduation from cardiac rehab.   Monitor and Evaluate progress toward nutrition goal with team.  Derek Mound, M.Ed, RD, LDN, CDE 06/08/2016 11:41 AM

## 2016-06-11 ENCOUNTER — Encounter (HOSPITAL_COMMUNITY)
Admission: RE | Admit: 2016-06-11 | Discharge: 2016-06-11 | Disposition: A | Discharge status: Home / Self Care | Payer: Medicare Other | Source: Ambulatory Visit | Attending: Internal Medicine | Admitting: Internal Medicine

## 2016-06-11 ENCOUNTER — Encounter (HOSPITAL_COMMUNITY): Payer: Medicare Other

## 2016-06-11 DIAGNOSIS — I214 Non-ST elevation (NSTEMI) myocardial infarction: Secondary | ICD-10-CM | POA: Diagnosis not present

## 2016-06-11 DIAGNOSIS — Z955 Presence of coronary angioplasty implant and graft: Secondary | ICD-10-CM

## 2016-06-13 ENCOUNTER — Encounter (HOSPITAL_COMMUNITY)
Admission: RE | Admit: 2016-06-13 | Discharge: 2016-06-13 | Disposition: A | Discharge status: Home / Self Care | Payer: Medicare Other | Source: Ambulatory Visit | Attending: Internal Medicine | Admitting: Internal Medicine

## 2016-06-13 ENCOUNTER — Encounter (HOSPITAL_COMMUNITY): Payer: Medicare Other

## 2016-06-13 DIAGNOSIS — Z955 Presence of coronary angioplasty implant and graft: Secondary | ICD-10-CM

## 2016-06-13 DIAGNOSIS — I214 Non-ST elevation (NSTEMI) myocardial infarction: Secondary | ICD-10-CM | POA: Diagnosis not present

## 2016-06-15 ENCOUNTER — Encounter (HOSPITAL_COMMUNITY)
Admission: RE | Admit: 2016-06-15 | Discharge: 2016-06-15 | Disposition: A | Discharge status: Home / Self Care | Payer: Medicare Other | Source: Ambulatory Visit | Attending: Internal Medicine | Admitting: Internal Medicine

## 2016-06-15 ENCOUNTER — Encounter (HOSPITAL_COMMUNITY): Payer: Medicare Other

## 2016-06-15 DIAGNOSIS — I214 Non-ST elevation (NSTEMI) myocardial infarction: Secondary | ICD-10-CM | POA: Diagnosis not present

## 2016-06-15 DIAGNOSIS — Z955 Presence of coronary angioplasty implant and graft: Secondary | ICD-10-CM

## 2016-06-18 ENCOUNTER — Encounter (HOSPITAL_COMMUNITY)
Admission: RE | Admit: 2016-06-18 | Discharge: 2016-06-18 | Disposition: A | Discharge status: Home / Self Care | Payer: Medicare Other | Source: Ambulatory Visit | Attending: Internal Medicine | Admitting: Internal Medicine

## 2016-06-18 ENCOUNTER — Encounter (HOSPITAL_COMMUNITY): Payer: Medicare Other

## 2016-06-18 ENCOUNTER — Ambulatory Visit (INDEPENDENT_AMBULATORY_CARE_PROVIDER_SITE_OTHER): Payer: Medicare Other | Admitting: Cardiology

## 2016-06-18 ENCOUNTER — Encounter: Payer: Self-pay | Admitting: Cardiology

## 2016-06-18 VITALS — BP 118/76 | HR 73 | Ht 64.5 in | Wt 202.6 lb

## 2016-06-18 DIAGNOSIS — I471 Supraventricular tachycardia: Secondary | ICD-10-CM | POA: Diagnosis not present

## 2016-06-18 DIAGNOSIS — I214 Non-ST elevation (NSTEMI) myocardial infarction: Secondary | ICD-10-CM | POA: Diagnosis not present

## 2016-06-18 DIAGNOSIS — Z955 Presence of coronary angioplasty implant and graft: Secondary | ICD-10-CM

## 2016-06-18 NOTE — Patient Instructions (Signed)
Medication Instructions:  Your physician recommends that you continue on your current medications as directed. Please refer to the Current Medication list given to you today.  * If you need a refill on your cardiac medications before your next appointment, please call your pharmacy.   Labwork: None ordered  Testing/Procedures: None ordered  Follow-Up: No follow up is needed at this time with Dr. Camnitz.  He will see you on an as needed basis.   Thank you for choosing [facility]!!   Treyveon Mochizuki, RN [phone]     

## 2016-06-18 NOTE — Progress Notes (Signed)
Electrophysiology Office Note   Date:  06/18/2016   ID:  Natalie Hall, DOB March 22, 1951, MRN [id number]  PCP:  Laurey Morale, MD  Cardiologist:  Debara Pickett Primary Electrophysiologist:  Tenessa Marsee Meredith Leeds, MD    Chief Complaint  Patient presents with  . Follow-up    SVT albation     History of Present Illness: Natalie Hall is a 65 y.o. female who presents today for electrophysiology evaluation.   Hx HTN, HLD, CAD s/p DES  to the RCA post NSTEMI, SVT who presented to the ER in June with tachycardia for 3 hours.    She was given adenosine 6 mg and had return to sinus rhythm.  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    Today, she denies symptoms of palpitations, chest pain, shortness of breath, orthopnea, PND, lower extremity edema, claudication, dizziness, presyncope, syncope, bleeding, or neurologic sequela. The patient is tolerating medications without difficulties and is otherwise without complaint today. She has not noticed any palpitations, and is felt well since her procedure.   Past Medical History:  Diagnosis Date  . Asthma    infrequent problem  . Choroidal nevus of left eye    sees Dr. Gerarda Fraction at [facility].   . GERD (gastroesophageal reflux disease)   . Hx of colonic polyp   . Hyperlipidemia   . Hypertension   . Osteoarthritis   . Supraventricular tachycardia [facility])    Past Surgical History:  Procedure Laterality Date  . ABDOMINAL HYSTERECTOMY    . ACHILLES TENDON REPAIR  10-07-11   torn on right, per Dr. Noemi Chapel   . ANKLE SURGERY     torn tendon left ankle  . BREAST BIOPSY    . CARDIAC CATHETERIZATION N/A 03/14/2016   Procedure: Left Heart Cath and Coronary Angiography;  Surgeon: Peter M Martinique, MD;  Location: [place] [facility];  Service: Cardiovascular;  Laterality: N/A;  . CARDIAC CATHETERIZATION N/A 03/14/2016   Procedure: Coronary Stent Intervention;  Surgeon: Peter M Martinique, MD;  Location: [place] [facility];  Service: Cardiovascular;  Laterality: N/A;  .  CARPAL TUNNEL RELEASE    . CHOLECYSTECTOMY    . COLONOSCOPY  06-23-14   per Dr. Ardis Hughs, adenomatous polyps, repeat in 5 yrs    . ELECTROPHYSIOLOGIC STUDY N/A 05/21/2016   Procedure: SVT Ablation;  Surgeon: Alma Muegge Meredith Leeds, MD;  Location: [place] [facility];  Service: Cardiovascular;  Laterality: N/A;  . KNEE CLOSED REDUCTION Left 02/03/2014   Procedure: CLOSED MANIPULATION LEFT KNEE;  Surgeon: Gearlean Alf, MD;  Location: [facility];  Service: Orthopedics;  Laterality: Left;  . POLYPECTOMY    . TOTAL KNEE ARTHROPLASTY Left 12/21/2013   Procedure: LEFT TOTAL KNEE ARTHROPLASTY;  Surgeon: Gearlean Alf, MD;  Location: [facility];  Service: Orthopedics;  Laterality: Left;  Marland Kitchen VARICOSE VEIN SURGERY       Current Outpatient Prescriptions  Medication Sig Dispense Refill  . AMBULATORY NON FORMULARY MEDICATION Take 90 mg by mouth 2 (two) times daily. Medication Name: BRILINTA 90 mg BID (TWILIGHT Research study PROVIDED)    . AMBULATORY NON FORMULARY MEDICATION Take 81 mg by mouth daily. Medication Name: Aspirin 81 mg Daily or PLACEBO (Randomized in the [facility])    . CALCIUM PO Take 1 tablet by mouth daily.     . Cholecalciferol (VITAMIN D-3 PO) Take 1 tablet by mouth daily.     Marland Kitchen loratadine (CLARITIN) 10 MG tablet Take 10 mg by mouth daily.    . metoprolol  succinate (TOPROL-XL) 25 MG 24 hr tablet Take 25 mg by mouth 2 (two) times daily.    . Multiple Vitamins-Minerals (MULTIVITAMIN PO) Take 1 tablet by mouth daily.     . nitroGLYCERIN (NITROSTAT) 0.4 MG SL tablet Dissolve 1 tablet under the tongue every 5 minutes for  3 doses as needed for chest pain 100 tablet 0  . omeprazole (PRILOSEC) 40 MG capsule Take 1 capsule by mouth  daily 90 capsule 0  . ramipril (ALTACE) 10 MG capsule Take 1 capsule (10 mg total) by mouth daily. 90 capsule 3  . rosuvastatin (CRESTOR) 10 MG tablet Take 1 tablet by mouth  daily 90 tablet 0  . triamcinolone (NASACORT) 55 MCG/ACT nasal inhaler Place 2 sprays into  the nose daily. 3 Inhaler 3   No current facility-administered medications for this visit.     Allergies:   Levaquin [levofloxacin in d5w]; Augmentin [amoxicillin-pot clavulanate]; Codeine; and Lexapro [escitalopram oxalate]   Social History:  The patient  reports that she has never smoked. She has never used smokeless tobacco. She reports that she does not drink alcohol or use drugs.   Family History:  The patient's family history includes Colon cancer in her maternal aunt; Colon polyps in her mother; Liver cancer in her maternal aunt; Lung cancer in her brother.    ROS:  Please see the history of present illness.   Otherwise, review of systems is positive for none.   All other systems are reviewed and negative.    PHYSICAL EXAM: VS:  BP 118/76   Pulse 73   Ht 5' 4.5" (1.638 m)   Wt 202 lb 9.6 oz (91.9 kg)   BMI 34.24 kg/m  , BMI Body mass index is 34.24 kg/m. GEN: Well nourished, well developed, in no acute distress  HEENT: normal  Neck: no JVD, carotid bruits, or masses Cardiac: RRR; no murmurs, rubs, or gallops,no edema  Respiratory:  clear to auscultation bilaterally, normal work of breathing GI: soft, nontender, nondistended, + BS MS: no deformity or atrophy  Skin: warm and dry Neuro:  Strength and sensation are intact Psych: euthymic mood, full affect  EKG:  EKG is ordered today. Personal review of the ECG shows sinus rhythm, rate 73  Recent Labs: 03/11/2016: ALT 31; Magnesium 1.9; TSH 2.588 05/14/2016: BUN 16; Creat 0.66; Hemoglobin 13.7; Platelets 268; Potassium 5.1; Sodium 133    Lipid Panel     Component Value Date/Time   CHOL 128 03/12/2016 0114   TRIG 210 (H) 03/12/2016 0114   HDL 20 (L) 03/12/2016 0114   CHOLHDL 6.4 03/12/2016 0114   VLDL 42 (H) 03/12/2016 0114   LDLCALC 66 03/12/2016 0114   LDLDIRECT 125.1 01/22/2013 0833     Wt Readings from Last 3 Encounters:  06/18/16 202 lb 9.6 oz (91.9 kg)  05/24/16 202 lb 13.2 oz (92 kg)  05/22/16 201 lb  (91.2 kg)      Other studies Reviewed: Additional studies/ records that were reviewed today include:  TTE 03/12/16 - Left ventricle: The cavity size was normal. Ambler thickness was  normal. Systolic function was vigorous. The estimated ejection  fraction was in the range of 65% to 70%. Doppler parameters are  consistent with abnormal left ventricular relaxation (grade 1  diastolic dysfunction).  Cath 03/14/16  Prox RCA lesion, 25% stenosed.  The left ventricular systolic function is normal.  Prox LAD to Mid LAD lesion, 15% stenosed.  Mid RCA-1 lesion, 95% stenosed.  Mid RCA-2 lesion, 95% stenosed. Post intervention,  there is a 0% residual stenosis.  1. Single vessel obstructive CAD 2. Normal LV function 3. Successful stenting of the mid to distal RCA with a DES   ASSESSMENT AND PLAN:  1.  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 Has not had any palpitations since her procedure was performed. Atisha Hamidi continue to monitor.  2. Hypertension: Blood pressure well controlled  3. Hyperlipidemia: continue her statin.   Current medicines are reviewed at length with the patient today.   The patient does not have concerns regarding her medicines.  The following changes were made today:  none  Labs/ tests ordered today include:  No orders of the defined types were placed in this encounter.    Disposition:   FU with Martita Brumm PRN  Signed, Danasia Baker Meredith Leeds, MD  06/18/2016 3:58 PM     [place] [address] [place] [address] [phone] (office) [phone] (fax)

## 2016-06-20 ENCOUNTER — Encounter (HOSPITAL_COMMUNITY): Payer: Medicare Other

## 2016-06-20 ENCOUNTER — Encounter (HOSPITAL_COMMUNITY)
Admission: RE | Admit: 2016-06-20 | Discharge: 2016-06-20 | Disposition: A | Discharge status: Home / Self Care | Payer: Medicare Other | Source: Ambulatory Visit | Attending: Internal Medicine | Admitting: Internal Medicine

## 2016-06-20 DIAGNOSIS — Z955 Presence of coronary angioplasty implant and graft: Secondary | ICD-10-CM

## 2016-06-20 DIAGNOSIS — I214 Non-ST elevation (NSTEMI) myocardial infarction: Secondary | ICD-10-CM | POA: Diagnosis not present

## 2016-06-22 ENCOUNTER — Encounter (HOSPITAL_COMMUNITY): Payer: Medicare Other

## 2016-06-22 ENCOUNTER — Encounter (HOSPITAL_COMMUNITY)
Admission: RE | Admit: 2016-06-22 | Discharge: 2016-06-22 | Disposition: A | Discharge status: Home / Self Care | Payer: Medicare Other | Source: Ambulatory Visit | Attending: Internal Medicine | Admitting: Internal Medicine

## 2016-06-22 DIAGNOSIS — I214 Non-ST elevation (NSTEMI) myocardial infarction: Secondary | ICD-10-CM | POA: Diagnosis not present

## 2016-06-22 DIAGNOSIS — Z955 Presence of coronary angioplasty implant and graft: Secondary | ICD-10-CM | POA: Diagnosis present

## 2016-06-25 ENCOUNTER — Encounter (HOSPITAL_COMMUNITY): Admission: RE | Admit: 2016-06-25 | Payer: Medicare Other | Source: Ambulatory Visit

## 2016-06-27 ENCOUNTER — Encounter (HOSPITAL_COMMUNITY): Payer: Medicare Other

## 2016-06-27 ENCOUNTER — Encounter (HOSPITAL_COMMUNITY)
Admission: RE | Admit: 2016-06-27 | Discharge: 2016-06-27 | Disposition: A | Discharge status: Home / Self Care | Payer: Medicare Other | Source: Ambulatory Visit | Attending: Internal Medicine | Admitting: Internal Medicine

## 2016-06-27 DIAGNOSIS — Z955 Presence of coronary angioplasty implant and graft: Secondary | ICD-10-CM

## 2016-06-27 DIAGNOSIS — I214 Non-ST elevation (NSTEMI) myocardial infarction: Secondary | ICD-10-CM | POA: Diagnosis not present

## 2016-06-29 ENCOUNTER — Encounter (HOSPITAL_COMMUNITY): Payer: Medicare Other

## 2016-06-29 ENCOUNTER — Encounter (HOSPITAL_COMMUNITY)
Admission: RE | Admit: 2016-06-29 | Discharge: 2016-06-29 | Disposition: A | Discharge status: Home / Self Care | Payer: Medicare Other | Source: Ambulatory Visit | Attending: Internal Medicine | Admitting: Internal Medicine

## 2016-06-29 DIAGNOSIS — I214 Non-ST elevation (NSTEMI) myocardial infarction: Secondary | ICD-10-CM | POA: Diagnosis not present

## 2016-06-29 DIAGNOSIS — Z955 Presence of coronary angioplasty implant and graft: Secondary | ICD-10-CM

## 2016-07-01 ENCOUNTER — Other Ambulatory Visit: Payer: Self-pay | Admitting: Family Medicine

## 2016-07-02 ENCOUNTER — Encounter (HOSPITAL_COMMUNITY): Payer: Medicare Other

## 2016-07-02 ENCOUNTER — Encounter (HOSPITAL_COMMUNITY)
Admission: RE | Admit: 2016-07-02 | Discharge: 2016-07-02 | Disposition: A | Discharge status: Home / Self Care | Payer: Medicare Other | Source: Ambulatory Visit | Attending: Internal Medicine | Admitting: Internal Medicine

## 2016-07-02 DIAGNOSIS — Z955 Presence of coronary angioplasty implant and graft: Secondary | ICD-10-CM

## 2016-07-02 DIAGNOSIS — I214 Non-ST elevation (NSTEMI) myocardial infarction: Secondary | ICD-10-CM | POA: Diagnosis not present

## 2016-07-04 ENCOUNTER — Encounter (HOSPITAL_COMMUNITY): Payer: Medicare Other

## 2016-07-04 ENCOUNTER — Encounter (HOSPITAL_COMMUNITY)
Admission: RE | Admit: 2016-07-04 | Discharge: 2016-07-04 | Disposition: A | Discharge status: Home / Self Care | Payer: Medicare Other | Source: Ambulatory Visit | Attending: Internal Medicine | Admitting: Internal Medicine

## 2016-07-04 DIAGNOSIS — Z955 Presence of coronary angioplasty implant and graft: Secondary | ICD-10-CM

## 2016-07-04 DIAGNOSIS — I214 Non-ST elevation (NSTEMI) myocardial infarction: Secondary | ICD-10-CM | POA: Diagnosis not present

## 2016-07-05 NOTE — Progress Notes (Signed)
Cardiac Individual Treatment Plan  Patient Details  Name: Natalie Hall MRN: [id number] Date of Birth: 1950-11-22 Referring Provider:   Flowsheet Row CARDIAC REHAB PHASE II ORIENTATION from 05/24/2016 in [place]  Referring Provider  Lyman Bishop      Initial Encounter Date:  [place] PHASE II EXERCISE from 06/29/2016 in [place]  Date  06/29/16      Visit Diagnosis: 03/14/16 Status post coronary artery stent placement  Patient's Home Medications on Admission:  Current Outpatient Prescriptions:  .  AMBULATORY NON FORMULARY MEDICATION, Take 90 mg by mouth 2 (two) times daily. Medication Name: BRILINTA 90 mg BID (TWILIGHT Research study PROVIDED), Disp: , Rfl:  .  AMBULATORY NON FORMULARY MEDICATION, Take 81 mg by mouth daily. Medication Name: Aspirin 81 mg Daily or PLACEBO (Randomized in the TWILIGHT Research Study), Disp: , Rfl:  .  CALCIUM PO, Take 1 tablet by mouth daily. , Disp: , Rfl:  .  Cholecalciferol (VITAMIN D-3 PO), Take 1 tablet by mouth daily. , Disp: , Rfl:  .  loratadine (CLARITIN) 10 MG tablet, Take 10 mg by mouth daily., Disp: , Rfl:  .  metoprolol succinate (TOPROL-XL) 25 MG 24 hr tablet, Take 25 mg by mouth 2 (two) times daily., Disp: , Rfl:  .  Multiple Vitamins-Minerals (MULTIVITAMIN PO), Take 1 tablet by mouth daily. , Disp: , Rfl:  .  nitroGLYCERIN (NITROSTAT) 0.4 MG SL tablet, Dissolve 1 tablet under the tongue every 5 minutes for  3 doses as needed for chest pain, Disp: 100 tablet, Rfl: 0 .  omeprazole (PRILOSEC) 40 MG capsule, Take 1 capsule by mouth  daily, Disp: 90 capsule, Rfl: 0 .  ramipril (ALTACE) 10 MG capsule, Take 1 capsule (10 mg total) by mouth daily., Disp: 90 capsule, Rfl: 3 .  rosuvastatin (CRESTOR) 10 MG tablet, Take 1 tablet by mouth  daily, Disp: 90 tablet, Rfl: 2 .  triamcinolone (NASACORT) 55 MCG/ACT nasal inhaler, Place 2 sprays into the nose daily., Disp: 3  Inhaler, Rfl: 3  Past Medical History: Past Medical History:  Diagnosis Date  . Asthma    infrequent problem  . Choroidal nevus of left eye    sees Dr. Gerarda Fraction at [facility].   . GERD (gastroesophageal reflux disease)   . Hx of colonic polyp   . Hyperlipidemia   . Hypertension   . Osteoarthritis   . Supraventricular tachycardia (HCC)     Tobacco Use: History  Smoking Status  . Never Smoker  Smokeless Tobacco  . Never Used    Labs: Recent Review Flowsheet Data    Labs for ITP Cardiac and Pulmonary Rehab Latest Ref Rng & Units 01/26/2014 01/27/2015 02/07/2016 03/12/2016 05/11/2016   Cholestrol 0 - 200 mg/dL 161 158 169 128 -   LDLCALC 0 - 99 mg/dL 97 101(H) 99 66 -   LDLDIRECT mg/dL - - - - -   HDL >40 mg/dL 30.20(L) 28.60(L) 32.50(L) 20(L) -   Trlycerides <150 mg/dL 167.0(H) 143.0 186.0(H) 210(H) -   TCO2 0 - 100 mmol/L - - - - 22      Capillary Blood Glucose: No results found for: GLUCAP   Exercise Target Goals:    Exercise Program Goal: Individual exercise prescription set with THRR, safety & activity barriers. Participant demonstrates ability to understand and report RPE using BORG scale, to self-measure pulse accurately, and to acknowledge the importance of the exercise prescription.  Exercise Prescription  Goal: Starting with aerobic activity 30 plus minutes a day, 3 days per week for initial exercise prescription. Provide home exercise prescription and guidelines that participant acknowledges understanding prior to discharge.  Activity Barriers & Risk Stratification:     Activity Barriers & Cardiac Risk Stratification - 05/24/16 0921      Activity Barriers & Cardiac Risk Stratification   Activity Barriers Left Knee Replacement;Other (comment)   Comments L achilles repair in 2012; R knee pain and L shoulder pain   Cardiac Risk Stratification High      6 Minute Walk:     6 Minute Walk    Row Name 05/24/16 1613         6 Minute Walk   Phase  Initial     Distance 800 feet     Walk Time 6 minutes     # of Rest Breaks 0     MPH 1.5     METS 1.7     RPE 12     VO2 Peak 5.99     Symptoms No     Resting HR 62 bpm     Resting BP 112/62     Max Ex. HR 87 bpm     Max Ex. BP 126/70     2 Minute Post BP 106/72        Initial Exercise Prescription:     Initial Exercise Prescription - 06/29/16 1700      Date of Initial Exercise RX and Referring Provider   Date 06/29/16     Recumbant Bike   Minutes 10     NuStep   Level 4   Minutes 10     Track   Minutes 10     Resistance Training   Training Prescription Yes   Reps 10-12      Perform Capillary Blood Glucose checks as needed.  Exercise Prescription Changes:      Exercise Prescription Changes    Row Name 06/08/16 0900 06/29/16 1700           Exercise Review   Progression Yes Yes        Response to Exercise   Blood Pressure (Admit) 108/72 122/78      Blood Pressure (Exercise) 120/80 120/80      Blood Pressure (Exit) 134/72 120/72      Heart Rate (Admit) 75 bpm 76 bpm      Heart Rate (Exercise) 97 bpm 100 bpm      Heart Rate (Exit) 71 bpm 78 bpm      Rating of Perceived Exertion (Exercise) 12 13      Symptoms none none      Comments Reviewed home exercise guidelines on 06/04/16. Reviewed home exercise guidelines on 06/04/16.      Duration Progress to 30 minutes of continuous aerobic without signs/symptoms of physical distress Progress to 30 minutes of continuous aerobic without signs/symptoms of physical distress      Intensity THRR unchanged THRR unchanged        Progression   Progression Continue to progress workloads to maintain intensity without signs/symptoms of physical distress. Continue to progress workloads to maintain intensity without signs/symptoms of physical distress.      Average METs 2.1 2.9        Resistance Training   Training Prescription Yes  -      Weight 1lb 3lbs      Reps 10-12  -        Interval Training   Interval  Training  -  No        Recumbant Bike   Level 1.5 2      Watts  - 26      Minutes 10  -      METs 2.2 2.87        NuStep   Level 4  -      Minutes 10  -      METs 2.3 2.6        Arm Ergometer   Level -  -      Minutes -  -      METs -  -        Track   Laps 5 13      Minutes 10  -      METs 1.84 3.26        Home Exercise Plan   Plans to continue exercise at [facility] 4 additional days to program exercise sessions. Add 4 additional days to program exercise sessions.         Exercise Comments:      Exercise Comments    Row Name 06/04/16 1632 06/29/16 1701         Exercise Comments Off to a good start with exercise. Reviewed home exercise guidelines with patient. Making steady progress with exercise. Walking and using recumbent bike at home 20-25 minutes 2-3 days /week .         Discharge Exercise Prescription (Final Exercise Prescription Changes):     Exercise Prescription Changes - 06/29/16 1700      Exercise Review   Progression Yes     Response to Exercise   Blood Pressure (Admit) 122/78   Blood Pressure (Exercise) 120/80   Blood Pressure (Exit) 120/72   Heart Rate (Admit) 76 bpm   Heart Rate (Exercise) 100 bpm   Heart Rate (Exit) 78 bpm   Rating of Perceived Exertion (Exercise) 13   Symptoms none   Comments Reviewed home exercise guidelines on 06/04/16.   Duration Progress to 30 minutes of continuous aerobic without signs/symptoms of physical distress   Intensity THRR unchanged     Progression   Progression Continue to progress workloads to maintain intensity without signs/symptoms of physical distress.   Average METs 2.9     Resistance Training   Weight 3lbs     Interval Training   Interval Training No     Recumbant Bike   Level 2   Watts 26   METs 2.87     NuStep   METs 2.6     Track   Laps 13   METs 3.26     Home Exercise Plan   Plans to continue exercise at Home   Frequency Add 4 additional days to program  exercise sessions.      Nutrition:  Target Goals: Understanding of nutrition guidelines, daily intake of sodium 1500mg , cholesterol 200mg , calories 30% from fat and 7% or less from saturated fats, daily to have 5 or more servings of fruits and vegetables.  Biometrics:     Pre Biometrics - 05/24/16 1618      Pre Biometrics   Waist Circumference 44.5 inches   Hip Circumference 46 inches   Waist to Hip Ratio 0.97 %   Triceps Skinfold 37 mm   % Body Fat 46.8 %   Grip Strength 31 kg   Flexibility 9 in   Single Leg Stand 19.84 seconds       Nutrition Therapy Plan and  Nutrition Goals:     Nutrition Therapy & Goals - 05/25/16 1230      Nutrition Therapy   Diet Therapeutic Lifestyle Change     Personal Nutrition Goals   Personal Goal #1 1-2 lb wt loss/week to a goal wt loss of 6-24 lb at graduation from [place]   Expected Outcomes Short Term Goal: Understand basic principles of dietary content, such as calories, fat, sodium, cholesterol and nutrients.;Long Term Goal: Adherence to prescribed nutrition plan.      Nutrition Discharge: Nutrition Scores:     Nutrition Assessments - 05/25/16 1230      MEDFICTS Scores   Pre Score 12      Nutrition Goals Re-Evaluation:   Psychosocial: Target Goals: Acknowledge presence or absence of depression, maximize coping skills, provide positive support system. Participant is able to verbalize types and ability to use techniques and skills needed for reducing stress and depression.  Initial Review & Psychosocial Screening:     Initial Psych Review & Screening - 05/28/16 [place]? Yes   Comments --  Runelle helps care for her elderly mother and her 72 year old mother in law.     Barriers   Psychosocial barriers to participate in program There are no identifiable barriers or psychosocial needs.;The patient should benefit from training in stress management and  relaxation.     Screening Interventions   Interventions Encouraged to exercise      Quality of Life Scores:     Quality of Life - 06/06/16 1016      Quality of Life Scores   Health/Function Pre --  concerned about recent heart event   Family Pre --  husband has heart problems wants son to loose weight   GLOBAL Pre --  Denies feeling depressed      PHQ-9: Recent Review Flowsheet Data    Depression screen [facility] 2/9 05/28/2016 07/13/2015   Decreased Interest 0 0   Down, Depressed, Hopeless 0 0   PHQ - 2 Score 0 0      Psychosocial Evaluation and Intervention:   Psychosocial Re-Evaluation:     Psychosocial Re-Evaluation    Row Name 06/07/16 0814 06/07/16 0830 06/08/16 0952 07/05/16 1358       Psychosocial Re-Evaluation   Interventions Encouraged to attend Cardiac Rehabilitation for the exercise Stress management education  - Encouraged to attend Cardiac Rehabilitation for the exercise    Continued Psychosocial Services Needed No  - No No       Vocational Rehabilitation: Provide vocational rehab assistance to qualifying candidates.   Vocational Rehab Evaluation & Intervention:     Vocational Rehab - 05/28/16 1728      Initial Vocational Rehab Evaluation & Intervention   Assessment shows need for [place] just retired from working on July the first.      Education: Education Goals: Education classes will be provided on a weekly basis, covering required topics. Participant will state understanding/return demonstration of topics presented.  Learning Barriers/Preferences:     Learning Barriers/Preferences - 05/24/16 0920      Learning Barriers/Preferences   Learning Barriers Sight   Learning Preferences Skilled Demonstration      Education Topics: Count Your Pulse:  -Group instruction provided by verbal instruction, demonstration, patient participation and written materials to support subject.  Instructors address importance of  being able to find your pulse and how to count your  pulse when at home without a heart monitor.  Patients get hands on experience counting their pulse with staff help and individually.   Heart Attack, Angina, and Risk Factor Modification:  -Group instruction provided by verbal instruction, video, and written materials to support subject.  Instructors address signs and symptoms of angina and heart attacks.    Also discuss risk factors for heart disease and how to make changes to improve heart health risk factors. Flowsheet Row CARDIAC REHAB PHASE II EXERCISE from 07/04/2016 in [place]  Date  06/13/16  Instruction Review Code  2- meets goals/outcomes      Functional Fitness:  -Group instruction provided by verbal instruction, demonstration, patient participation, and written materials to support subject.  Instructors address safety measures for doing things around the house.  Discuss how to get up and down off the floor, how to pick things up properly, how to safely get out of a chair without assistance, and balance training. Flowsheet Row CARDIAC REHAB PHASE II EXERCISE from 07/04/2016 in [place]  Date  06/08/16  Educator  Seward Carol EP  Instruction Review Code  2- meets goals/outcomes      Meditation and Mindfulness:  -Group instruction provided by verbal instruction, patient participation, and written materials to support subject.  Instructor addresses importance of mindfulness and meditation practice to help reduce stress and improve awareness.  Instructor also leads participants through a meditation exercise.  Flowsheet Row CARDIAC REHAB PHASE II EXERCISE from 07/04/2016 in [place]  Date  06/20/16  Educator  Jeanella Craze  Instruction Review Code  2- meets goals/outcomes      Stretching for Flexibility and Mobility:  -Group instruction provided by verbal instruction, patient  participation, and written materials to support subject.  Instructors lead participants through series of stretches that are designed to increase flexibility thus improving mobility.  These stretches are additional exercise for major muscle groups that are typically performed during regular warm up and cool down. Flowsheet Row CARDIAC REHAB PHASE II EXERCISE from 07/04/2016 in [place]  Date  06/15/16  Instruction Review Code  2- meets goals/outcomes      Hands Only CPR Anytime:  -Group instruction provided by verbal instruction, video, patient participation and written materials to support subject.  Instructors co-teach with AHA video for hands only CPR.  Participants get hands on experience with mannequins.   Nutrition I class: Heart Healthy Eating:  -Group instruction provided by PowerPoint slides, verbal discussion, and written materials to support subject matter. The instructor gives an explanation and review of the Therapeutic Lifestyle Changes diet recommendations, which includes a discussion on lipid goals, dietary fat, sodium, fiber, plant stanol/sterol esters, sugar, and the components of a well-balanced, healthy diet. Flowsheet Row CARDIAC REHAB PHASE II EXERCISE from 07/04/2016 in [place]  Date  06/26/16  Educator  RD  Instruction Review Code  2- meets goals/outcomes      Nutrition II class: Lifestyle Skills:  -Group instruction provided by PowerPoint slides, verbal discussion, and written materials to support subject matter. The instructor gives an explanation and review of label reading, grocery shopping for heart health, heart healthy recipe modifications, and ways to make healthier choices when eating out. Flowsheet Row CARDIAC REHAB PHASE II EXERCISE from 07/04/2016 in [place]  Date  07/03/16  Educator  RD  Instruction Review Code  2- meets goals/outcomes  Diabetes  Question & Answer:  -Group instruction provided by PowerPoint slides, verbal discussion, and written materials to support subject matter. The instructor gives an explanation and review of diabetes co-morbidities, pre- and post-prandial blood glucose goals, pre-exercise blood glucose goals, signs, symptoms, and treatment of hypoglycemia and hyperglycemia, and foot care basics.   Diabetes Blitz:  -Group instruction provided by PowerPoint slides, verbal discussion, and written materials to support subject matter. The instructor gives an explanation and review of the physiology behind type 1 and type 2 diabetes, diabetes medications and rational behind using different medications, pre- and post-prandial blood glucose recommendations and Hemoglobin A1c goals, diabetes diet, and exercise including blood glucose guidelines for exercising safely.    Portion Distortion:  -Group instruction provided by PowerPoint slides, verbal discussion, written materials, and food models to support subject matter. The instructor gives an explanation of serving size versus portion size, changes in portions sizes over the last 20 years, and what consists of a serving from each food group. Flowsheet Row CARDIAC REHAB PHASE II EXERCISE from 07/04/2016 in [place]  Date  05/30/16  Educator  RD  Instruction Review Code  2- meets goals/outcomes      Stress Management:  -Group instruction provided by verbal instruction, video, and written materials to support subject matter.  Instructors review role of stress in heart disease and how to cope with stress positively.     Exercising on Your Own:  -Group instruction provided by verbal instruction, power point, and written materials to support subject.  Instructors discuss benefits of exercise, components of exercise, frequency and intensity of exercise, and end points for exercise.  Also discuss use of nitroglycerin and activating EMS.  Review  options of places to exercise outside of rehab.  Review guidelines for sex with heart disease.   Cardiac Drugs I:  -Group instruction provided by verbal instruction and written materials to support subject.  Instructor reviews cardiac drug classes: antiplatelets, anticoagulants, beta blockers, and statins.  Instructor discusses reasons, side effects, and lifestyle considerations for each drug class. Flowsheet Row CARDIAC REHAB PHASE II EXERCISE from 07/04/2016 in [place]  Date  06/06/16  Educator  pharmD   Instruction Review Code  2- meets goals/outcomes      Cardiac Drugs II:  -Group instruction provided by verbal instruction and written materials to support subject.  Instructor reviews cardiac drug classes: angiotensin converting enzyme inhibitors (ACE-I), angiotensin II receptor blockers (ARBs), nitrates, and calcium channel blockers.  Instructor discusses reasons, side effects, and lifestyle considerations for each drug class. Flowsheet Row CARDIAC REHAB PHASE II EXERCISE from 07/04/2016 in [place]  Date  07/04/16  Educator  pharmacy  Instruction Review Code  2- meets goals/outcomes      Anatomy and Physiology of the Circulatory System:  -Group instruction provided by verbal instruction, video, and written materials to support subject.  Reviews functional anatomy of heart, how it relates to various diagnoses, and what role the heart plays in the overall system. Flowsheet Row CARDIAC REHAB PHASE II EXERCISE from 07/04/2016 in [place]  Date  06/27/16  Instruction Review Code  2- meets goals/outcomes      Knowledge Questionnaire Score:     Knowledge Questionnaire Score - 05/24/16 1618      Knowledge Questionnaire Score   Pre Score 24/24      Core Components/Risk Factors/Patient Goals at Admission:     Personal Goals and  Risk Factors at Admission - 05/24/16 0924      Core  Components/Risk Factors/Patient Goals on Admission    Weight Management Weight Loss;Yes   Intervention Weight Management: Develop a combined nutrition and exercise program designed to reach desired caloric intake, while maintaining appropriate intake of nutrient and fiber, sodium and fats, and appropriate energy expenditure required for the weight goal.;Weight Management: Provide education and appropriate resources to help participant work on and attain dietary goals.;Weight Management/Obesity: Establish reasonable short term and long term weight goals.;Obesity: Provide education and appropriate resources to help participant work on and attain dietary goals.   Expected Outcomes Short Term: Continue to assess and modify interventions until short term weight is achieved;Weight Maintenance: Understanding of the daily nutrition guidelines, which includes 25-35% calories from fat, 7% or less cal from saturated fats, less than 200mg  cholesterol, less than 1.5gm of sodium, & 5 or more servings of fruits and vegetables daily;Weight Loss: Understanding of general recommendations for a balanced deficit meal plan, which promotes 1-2 lb weight loss per week and includes a negative energy balance of [phone] kcal/d;Understanding recommendations for meals to include 15-35% energy as protein, 25-35% energy from fat, 35-60% energy from carbohydrates, less than 200mg  of dietary cholesterol, 20-35 gm of total fiber daily;Understanding of distribution of calorie intake throughout the day with the consumption of 4-5 meals/snacks   Sedentary Yes   Intervention Provide advice, education, support and counseling about physical activity/exercise needs.;Develop an individualized exercise prescription for aerobic and resistive training based on initial evaluation findings, risk stratification, comorbidities and participant's personal goals.   Expected Outcomes Achievement of increased cardiorespiratory fitness and enhanced flexibility,  muscular endurance and strength shown through measurements of functional capacity and personal statement of participant.   Increase Strength and Stamina Yes   Intervention Provide advice, education, support and counseling about physical activity/exercise needs.;Develop an individualized exercise prescription for aerobic and resistive training based on initial evaluation findings, risk stratification, comorbidities and participant's personal goals.   Expected Outcomes Achievement of increased cardiorespiratory fitness and enhanced flexibility, muscular endurance and strength shown through measurements of functional capacity and personal statement of participant.   Improve shortness of breath with ADL's Yes   Intervention Provide education, individualized exercise plan and daily activity instruction to help decrease symptoms of SOB with activities of daily living.   Expected Outcomes Short Term: Achieves a reduction of symptoms when performing activities of daily living.   Hypertension Yes   Intervention Provide education on lifestyle modifcations including regular physical activity/exercise, weight management, moderate sodium restriction and increased consumption of fresh fruit, vegetables, and low fat dairy, alcohol moderation, and smoking cessation.;Monitor prescription use compliance.   Expected Outcomes Short Term: Continued assessment and intervention until BP is < 140/12mm HG in hypertensive participants. < 130/49mm HG in hypertensive participants with diabetes, heart failure or chronic kidney disease.;Long Term: Maintenance of blood pressure at goal levels.   Lipids Yes   Expected Outcomes Short Term: Participant states understanding of desired cholesterol values and is compliant with medications prescribed. Participant is following exercise prescription and nutrition guidelines.;Long Term: Cholesterol controlled with medications as prescribed, with individualized exercise RX and with personalized  nutrition plan. Value goals: LDL < 70mg , HDL > 40 mg.   Personal Goal Other Yes   Personal Goal short: Learn what to do for exercise and nutrition counseling long: maintaining an exercise routine and nutrition program (overall healthier lifestyle)   Intervention Provide nutrition counseling and exercise programming to improve cardiovascular fitness and overall quality of  life   Expected Outcomes Pt will be able to live a healthier lifestlye and maintain exercise and nutrition program      Core Components/Risk Factors/Patient Goals Review:      Goals and Risk Factor Review    Row Name 06/04/16 1634 06/29/16 1705           Core Components/Risk Factors/Patient Goals Review   Personal Goals Review Other Other      Review Home exercise guidelines given including stop signs, temperature guidelines, and heart rate and rate of perceived exertion parameters. Pt has recumbent bike at home, which she will use for her home exercise guidelines. Patient has begun regular exercise routine: walking and using recumbent bike 20-25 minutes, 2-3 days/week.      Expected Outcomes Exercise 3 dyas per week using recumbent bike and walking to achieve improved cardiorespiratory benefits. Exercise 3 dyas per week using recumbent bike and walking to achieve improved cardiorespiratory benefits.         Core Components/Risk Factors/Patient Goals at Discharge (Final Review):      Goals and Risk Factor Review - 06/29/16 1705      Core Components/Risk Factors/Patient Goals Review   Personal Goals Review Other   Review Patient has begun regular exercise routine: walking and using recumbent bike 20-25 minutes, 2-3 days/week.   Expected Outcomes Exercise 3 dyas per week using recumbent bike and walking to achieve improved cardiorespiratory benefits.      ITP Comments:     ITP Comments    Row Name 05/24/16 [phone]           ITP Comments Dr. Fransico Him, Medical Director          Comments: Gracia is making  expected progress toward personal goals after completing 18 sessions. Recommend continued exercise and life style modification education including  stress management and relaxation techniques to decrease cardiac risk profile. Barnet Pall, RN,BSN 07/06/2016 5:01 PM

## 2016-07-06 ENCOUNTER — Encounter (HOSPITAL_COMMUNITY)
Admission: RE | Admit: 2016-07-06 | Discharge: 2016-07-06 | Disposition: A | Discharge status: Home / Self Care | Payer: Medicare Other | Source: Ambulatory Visit | Attending: Internal Medicine | Admitting: Internal Medicine

## 2016-07-06 ENCOUNTER — Encounter (HOSPITAL_COMMUNITY): Payer: Medicare Other

## 2016-07-06 DIAGNOSIS — Z955 Presence of coronary angioplasty implant and graft: Secondary | ICD-10-CM

## 2016-07-06 DIAGNOSIS — I214 Non-ST elevation (NSTEMI) myocardial infarction: Secondary | ICD-10-CM | POA: Diagnosis not present

## 2016-07-09 ENCOUNTER — Encounter (HOSPITAL_COMMUNITY)
Admission: RE | Admit: 2016-07-09 | Discharge: 2016-07-09 | Disposition: A | Discharge status: Home / Self Care | Payer: Medicare Other | Source: Ambulatory Visit | Attending: Internal Medicine | Admitting: Internal Medicine

## 2016-07-09 ENCOUNTER — Encounter (HOSPITAL_COMMUNITY): Payer: Medicare Other

## 2016-07-09 DIAGNOSIS — I214 Non-ST elevation (NSTEMI) myocardial infarction: Secondary | ICD-10-CM | POA: Diagnosis not present

## 2016-07-09 DIAGNOSIS — Z955 Presence of coronary angioplasty implant and graft: Secondary | ICD-10-CM

## 2016-07-10 LAB — LIPID PANEL
CHOLESTEROL: 173 mg/dL (ref 125–200)
HDL: 28 mg/dL — ABNORMAL LOW (ref >=46)
LDL Cholesterol: 104 mg/dL (ref <130)
Total CHOL/HDL Ratio: 6.2 Ratio — ABNORMAL HIGH (ref <=5.0)
Triglycerides: 206 mg/dL — ABNORMAL HIGH (ref <150)
VLDL: 41 mg/dL — ABNORMAL HIGH (ref <30)

## 2016-07-10 LAB — HEPATIC FUNCTION PANEL
ALT: 20 U/L (ref 6–29)
AST: 21 U/L (ref 10–35)
Albumin: 4.1 g/dL (ref 3.6–5.1)
Alkaline Phosphatase: 46 U/L (ref 33–130)
BILIRUBIN DIRECT: 0.1 mg/dL (ref <=0.2)
BILIRUBIN INDIRECT: 0.3 mg/dL (ref 0.2–1.2)
Total Bilirubin: 0.4 mg/dL (ref 0.2–1.2)
Total Protein: 7.6 g/dL (ref 6.1–8.1)

## 2016-07-11 ENCOUNTER — Encounter: Payer: Self-pay | Admitting: *Deleted

## 2016-07-11 ENCOUNTER — Encounter (HOSPITAL_COMMUNITY): Payer: Medicare Other

## 2016-07-11 ENCOUNTER — Telehealth: Payer: Self-pay | Admitting: *Deleted

## 2016-07-11 ENCOUNTER — Encounter (HOSPITAL_COMMUNITY)
Admission: RE | Admit: 2016-07-11 | Discharge: 2016-07-11 | Disposition: A | Discharge status: Home / Self Care | Payer: Medicare Other | Source: Ambulatory Visit | Attending: Internal Medicine | Admitting: Internal Medicine

## 2016-07-11 DIAGNOSIS — Z006 Encounter for examination for normal comparison and control in clinical research program: Secondary | ICD-10-CM

## 2016-07-11 DIAGNOSIS — I214 Non-ST elevation (NSTEMI) myocardial infarction: Secondary | ICD-10-CM | POA: Diagnosis not present

## 2016-07-11 DIAGNOSIS — Z955 Presence of coronary angioplasty implant and graft: Secondary | ICD-10-CM

## 2016-07-11 NOTE — Telephone Encounter (Signed)
Left message for patient to call research office for month 4 TWILIGHT Research visit.

## 2016-07-11 NOTE — Progress Notes (Signed)
TWILIGHT Research study month 4 telephone visit completed. Patient denies any bleeding or other adverse events, she has been compliant with research provided medication. She has not taken any open label ASA.

## 2016-07-13 ENCOUNTER — Encounter: Payer: Self-pay | Admitting: Internal Medicine

## 2016-07-13 ENCOUNTER — Encounter (HOSPITAL_COMMUNITY)
Admission: RE | Admit: 2016-07-13 | Discharge: 2016-07-13 | Disposition: A | Discharge status: Home / Self Care | Payer: Medicare Other | Source: Ambulatory Visit | Attending: Internal Medicine | Admitting: Internal Medicine

## 2016-07-13 ENCOUNTER — Ambulatory Visit (INDEPENDENT_AMBULATORY_CARE_PROVIDER_SITE_OTHER): Payer: Medicare Other | Admitting: Internal Medicine

## 2016-07-13 ENCOUNTER — Encounter (HOSPITAL_COMMUNITY): Payer: Medicare Other

## 2016-07-13 VITALS — BP 122/78 | HR 77 | Ht 64.5 in | Wt 199.6 lb

## 2016-07-13 DIAGNOSIS — Z955 Presence of coronary angioplasty implant and graft: Secondary | ICD-10-CM

## 2016-07-13 DIAGNOSIS — E785 Hyperlipidemia, unspecified: Secondary | ICD-10-CM

## 2016-07-13 DIAGNOSIS — Z006 Encounter for examination for normal comparison and control in clinical research program: Secondary | ICD-10-CM

## 2016-07-13 DIAGNOSIS — I10 Essential (primary) hypertension: Secondary | ICD-10-CM

## 2016-07-13 DIAGNOSIS — I214 Non-ST elevation (NSTEMI) myocardial infarction: Secondary | ICD-10-CM | POA: Diagnosis not present

## 2016-07-13 MED ORDER — ROSUVASTATIN CALCIUM 20 MG PO TABS: 20.0000 mg | ORAL_TABLET | Freq: Every day | ORAL | 3 refills | Status: DC

## 2016-07-13 NOTE — Patient Instructions (Signed)
Medication Instructions:  INCREASE Crestor to 20mg  Take 1 tablet once a day  Labwork: Your physician recommends that you return for lab work in: [place] 3 MONTHS   Testing/Procedures: NONE ORDERED  Follow-Up: Your physician recommends that you schedule a follow-up appointment in: [place].  Any Other Special Instructions Will Be Listed Below (If Applicable).   COMPLETE LABS BEFORE 3 MONTH VISIT WITH DR HILTY   If you need a refill on your cardiac medications before your next appointment, please call your pharmacy.   NEXT APPT  DATE:______________________________________  TIME:______________________________________AM/PM

## 2016-07-16 ENCOUNTER — Encounter (HOSPITAL_COMMUNITY)
Admission: RE | Admit: 2016-07-16 | Discharge: 2016-07-16 | Disposition: A | Discharge status: Home / Self Care | Payer: Medicare Other | Source: Ambulatory Visit | Attending: Internal Medicine | Admitting: Internal Medicine

## 2016-07-16 ENCOUNTER — Encounter (HOSPITAL_COMMUNITY): Payer: Medicare Other

## 2016-07-16 DIAGNOSIS — Z955 Presence of coronary angioplasty implant and graft: Secondary | ICD-10-CM

## 2016-07-16 DIAGNOSIS — I214 Non-ST elevation (NSTEMI) myocardial infarction: Secondary | ICD-10-CM | POA: Diagnosis not present

## 2016-07-16 NOTE — Progress Notes (Signed)
OFFICE NOTE  Chief Complaint:  Hospital follow-up  Primary Care Physician: Laurey Morale, MD  HPI:  Natalie Hall is a 65 y.o. female who I last saw in the hospital a few weeks ago. She was admitted for an SVT which spontaneously converted in the ER on Cardizem and did not return. Unfortunately she had a non-ST elevation MI with troponin peak of 1.64. She underwent a Lexiscan Myoview which demonstrated a moderate region of reversibility in the mid and apical segments of the inferior lateral [place] consistent with ischemia. EF was 61%. She then was referred to cardiac catheterization which demonstrated the following:  Cardiac Cath 05/24   Prox RCA lesion, 25% stenosed.  The left ventricular systolic function is normal.  Prox LAD to Mid LAD lesion, 15% stenosed.  Mid RCA-1 lesion, 95% stenosed.  Mid RCA-2 lesion, 95% stenosed. Post intervention, there is a 0% residual stenosis.SYNERGY DES 3X32 1. Single vessel obstructive CAD 2. Normal LV function 3. Successful stenting of the mid to distal RCA with a DES, SYNERGY DES 3X32 Plan: DAPT for one year. Patient may be a candidate for the TWILIGHT trial in which case study protocol will be followed. Anticipate DC in am.  [name] returns today and is doing quite well. She denies any chest pain or worsening shortness of breath. She is compliant with her medications. She did enroll in the TWILIGHT trial. Unfortunately, she was considering shoulder surgery but this will be delayed due to the necessity of dual antiplatelet therapy with her recent stent. I advised her the Tylenol or her tramadol would be safe medicines used for pain.  07/13/2016  Since I last saw her, she was admitted to the hospital for adenosine-responsive AVNRT. She underwent successful catheter ablation and has not had recurrence. She denies any chest pain or dyspnea. She remains in the TWILIGHT trial with Brillinta. Recent lipid profilw shows LDL remains not at goal at  104. HDL low at 28. She is on Crestor 10 mg daily.  PMHx:  Past Medical History:  Diagnosis Date  . Asthma    infrequent problem  . Choroidal nevus of left eye    sees Dr. Gerarda Fraction at [facility].   . GERD (gastroesophageal reflux disease)   . Hx of colonic polyp   . Hyperlipidemia   . Hypertension   . Osteoarthritis   . Supraventricular tachycardia [facility])     Past Surgical History:  Procedure Laterality Date  . ABDOMINAL HYSTERECTOMY    . ACHILLES TENDON REPAIR  10-07-11   torn on right, per Dr. Noemi Chapel   . ANKLE SURGERY     torn tendon left ankle  . BREAST BIOPSY    . CARDIAC CATHETERIZATION N/A 03/14/2016   Procedure: Left Heart Cath and Coronary Angiography;  Surgeon: Peter M Martinique, MD;  Location: [place] [facility];  Service: Cardiovascular;  Laterality: N/A;  . CARDIAC CATHETERIZATION N/A 03/14/2016   Procedure: Coronary Stent Intervention;  Surgeon: Peter M Martinique, MD;  Location: [place] [facility];  Service: Cardiovascular;  Laterality: N/A;  . CARPAL TUNNEL RELEASE    . CHOLECYSTECTOMY    . COLONOSCOPY  06-23-14   per Dr. Ardis Hughs, adenomatous polyps, repeat in 5 yrs    . ELECTROPHYSIOLOGIC STUDY N/A 05/21/2016   Procedure: SVT Ablation;  Surgeon: [name], MD;  Location: [place] [facility];  Service: Cardiovascular;  Laterality: N/A;  . KNEE CLOSED REDUCTION Left 02/03/2014   Procedure: CLOSED MANIPULATION LEFT KNEE;  Surgeon: Gearlean Alf, MD;  Location: [facility];  Service: Orthopedics;  Laterality: Left;  . POLYPECTOMY    . TOTAL KNEE ARTHROPLASTY Left 12/21/2013   Procedure: LEFT TOTAL KNEE ARTHROPLASTY;  Surgeon: Gearlean Alf, MD;  Location: [facility];  Service: Orthopedics;  Laterality: Left;  Marland Kitchen VARICOSE VEIN SURGERY      FAMHx:  Family History  Problem Relation Age of Onset  . Colon polyps Mother   . Colon cancer Maternal Aunt   . Liver cancer Maternal Aunt   . Breast cancer    . Coronary artery disease    . Colon cancer    . Diabetes      . Hypertension    . Kidney disease    . Lung cancer    . Lung cancer Brother     spleen, bone  . Esophageal cancer Neg Hx   . Stomach cancer Neg Hx   . Rectal cancer Neg Hx     SOCHx:   reports that she has never smoked. She has never used smokeless tobacco. She reports that she does not drink alcohol or use drugs.  ALLERGIES:  Allergies  Allergen Reactions  . Levaquin [Levofloxacin In D5w] Other (See Comments)    Leg pain   . Augmentin [Amoxicillin-Pot Clavulanate] Rash  . Codeine Other (See Comments)    flushing  . Lexapro [Escitalopram Oxalate] Rash    ROS: Pertinent items noted in HPI and remainder of comprehensive ROS otherwise negative.  HOME MEDS: Current Outpatient Prescriptions  Medication Sig Dispense Refill  . AMBULATORY NON FORMULARY MEDICATION Take 90 mg by mouth 2 (two) times daily. Medication Name: BRILINTA 90 mg BID (TWILIGHT Research study PROVIDED)    . AMBULATORY NON FORMULARY MEDICATION Take 81 mg by mouth daily. Medication Name: Aspirin 81 mg Daily or PLACEBO (Randomized in the [facility])    . CALCIUM PO Take 1 tablet by mouth daily.     . Cholecalciferol (VITAMIN D-3 PO) Take 1 tablet by mouth daily.     Marland Kitchen loratadine (CLARITIN) 10 MG tablet Take 10 mg by mouth daily.    . metoprolol succinate (TOPROL-XL) 25 MG 24 hr tablet Take 25 mg by mouth 2 (two) times daily.    . Multiple Vitamins-Minerals (MULTIVITAMIN PO) Take 1 tablet by mouth daily.     . nitroGLYCERIN (NITROSTAT) 0.4 MG SL tablet Dissolve 1 tablet under the tongue every 5 minutes for  3 doses as needed for chest pain 100 tablet 0  . omeprazole (PRILOSEC) 40 MG capsule Take 1 capsule by mouth  daily 90 capsule 0  . ramipril (ALTACE) 10 MG capsule Take 1 capsule (10 mg total) by mouth daily. 90 capsule 3  . triamcinolone (NASACORT) 55 MCG/ACT nasal inhaler Place 2 sprays into the nose daily. 3 Inhaler 3  . rosuvastatin (CRESTOR) 20 MG tablet Take 1 tablet (20 mg total) by mouth  daily. 90 tablet 3   No current facility-administered medications for this visit.     LABS/IMAGING: No results found for this or any previous visit (from the past 48 hour(s)). No results found.  WEIGHTS: Wt Readings from Last 3 Encounters:  07/13/16 199 lb 9.6 oz (90.5 kg)  06/18/16 202 lb 9.6 oz (91.9 kg)  05/24/16 202 lb 13.2 oz (92 kg)    VITALS: BP 122/78 (BP Location: Right Arm, Patient Position: Sitting, Cuff Size: Normal)   Pulse 77   Ht 5' 4.5" (1.638 m)   Wt 199 lb 9.6 oz (90.5  kg)   SpO2 98%   BMI 33.73 kg/m   EXAM: General appearance: alert and no distress Neck: no carotid bruit and no JVD Lungs: clear to auscultation bilaterally Heart: regular rate and rhythm, S1, S2 normal, no murmur, click, rub or gallop Abdomen: soft, non-tender; bowel sounds normal; no masses,  no organomegaly Extremities: extremities normal, atraumatic, no cyanosis or edema Pulses: 2+ and symmetric Skin: Skin color, texture, turgor normal. No rashes or lesions Neurologic: Grossly normal Psych: Pleasant  EKG: Deferred  ASSESSMENT: 1. Coronary artery disease status post PCI with a drug-eluting stent to the mid/distal RCA (02/2016) 2. AVNRT - s/p catheter ablation in 04/2016 3. S/p NSTEMI with abnormal nuclear stress test 4. Dyslipidemia 5. Hypertension  PLAN: 1.   Mrs. Henthorn is doing well after her RCA stent. This was for non-ST elevation MI. She also had catheter ablation for AVNRT - this was being masked with diltiazem, but resurfaced after changing diltiazem to metoprolol for CAD. It was deemed successful. Cholesterol recently checked and not at goal LDL <70. Agree with recommendation to increase Crestor to 20 mg daily and re-check lipid profile in 3 months. Will be on Brillinta likely until 05/2017 for trial. Follow-up in 3 months.  Pixie Casino, MD, [facility] Attending Cardiologist Jerome C Kyeshia Zinn 07/16/2016, 9:10 PM

## 2016-07-18 ENCOUNTER — Encounter (HOSPITAL_COMMUNITY)
Admission: RE | Admit: 2016-07-18 | Discharge: 2016-07-18 | Disposition: A | Discharge status: Home / Self Care | Payer: Medicare Other | Source: Ambulatory Visit | Attending: Internal Medicine | Admitting: Internal Medicine

## 2016-07-18 ENCOUNTER — Encounter (HOSPITAL_COMMUNITY): Payer: Medicare Other

## 2016-07-18 DIAGNOSIS — Z955 Presence of coronary angioplasty implant and graft: Secondary | ICD-10-CM

## 2016-07-18 DIAGNOSIS — I214 Non-ST elevation (NSTEMI) myocardial infarction: Secondary | ICD-10-CM | POA: Diagnosis not present

## 2016-07-20 ENCOUNTER — Encounter (HOSPITAL_COMMUNITY): Payer: Medicare Other

## 2016-07-20 ENCOUNTER — Encounter (HOSPITAL_COMMUNITY)
Admission: RE | Admit: 2016-07-20 | Discharge: 2016-07-20 | Disposition: A | Discharge status: Home / Self Care | Payer: Medicare Other | Source: Ambulatory Visit | Attending: Internal Medicine | Admitting: Internal Medicine

## 2016-07-20 DIAGNOSIS — Z955 Presence of coronary angioplasty implant and graft: Secondary | ICD-10-CM

## 2016-07-20 DIAGNOSIS — I214 Non-ST elevation (NSTEMI) myocardial infarction: Secondary | ICD-10-CM | POA: Diagnosis not present

## 2016-07-23 ENCOUNTER — Encounter (HOSPITAL_COMMUNITY): Payer: Medicare Other

## 2016-07-25 ENCOUNTER — Encounter (HOSPITAL_COMMUNITY): Payer: Medicare Other

## 2016-07-27 ENCOUNTER — Encounter (HOSPITAL_COMMUNITY): Payer: Medicare Other

## 2016-07-30 ENCOUNTER — Encounter (HOSPITAL_COMMUNITY): Payer: Medicare Other

## 2016-07-30 ENCOUNTER — Encounter (HOSPITAL_COMMUNITY)
Admission: RE | Admit: 2016-07-30 | Discharge: 2016-07-30 | Disposition: A | Discharge status: Home / Self Care | Payer: Medicare Other | Source: Ambulatory Visit | Attending: Internal Medicine | Admitting: Internal Medicine

## 2016-07-30 DIAGNOSIS — I214 Non-ST elevation (NSTEMI) myocardial infarction: Secondary | ICD-10-CM | POA: Insufficient documentation

## 2016-07-30 DIAGNOSIS — Z955 Presence of coronary angioplasty implant and graft: Secondary | ICD-10-CM | POA: Diagnosis present

## 2016-08-01 ENCOUNTER — Encounter (HOSPITAL_COMMUNITY)
Admission: RE | Admit: 2016-08-01 | Discharge: 2016-08-01 | Disposition: A | Discharge status: Home / Self Care | Payer: Medicare Other | Source: Ambulatory Visit | Attending: Internal Medicine | Admitting: Internal Medicine

## 2016-08-01 ENCOUNTER — Ambulatory Visit (INDEPENDENT_AMBULATORY_CARE_PROVIDER_SITE_OTHER): Payer: Medicare Other | Admitting: Family Medicine

## 2016-08-01 ENCOUNTER — Encounter (HOSPITAL_COMMUNITY): Payer: Medicare Other

## 2016-08-01 DIAGNOSIS — Z23 Encounter for immunization: Secondary | ICD-10-CM | POA: Diagnosis not present

## 2016-08-01 DIAGNOSIS — Z955 Presence of coronary angioplasty implant and graft: Secondary | ICD-10-CM

## 2016-08-01 DIAGNOSIS — I214 Non-ST elevation (NSTEMI) myocardial infarction: Secondary | ICD-10-CM | POA: Diagnosis not present

## 2016-08-02 NOTE — Progress Notes (Signed)
Cardiac Individual Treatment Plan  Patient Details  Name: Natalie Hall MRN: [id number] Date of Birth: Apr 12, 1951 (Patient age 65) Referring Provider:   Flowsheet Row CARDIAC REHAB PHASE II ORIENTATION from 05/24/2016 in [place]  Referring Provider  Lyman Bishop      Initial Encounter Date:  [place] PHASE II EXERCISE from 06/29/2016 in [place]  Date  06/29/16      Visit Diagnosis: 03/14/16 Status post coronary artery stent placement  Patient's Home Medications on Admission:  Current Outpatient Prescriptions:  .  AMBULATORY NON FORMULARY MEDICATION, Take 90 mg by mouth 2 (two) times daily. Medication Name: BRILINTA 90 mg BID (TWILIGHT Research study PROVIDED), Disp: , Rfl:  .  AMBULATORY NON FORMULARY MEDICATION, Take 81 mg by mouth daily. Medication Name: Aspirin 81 mg Daily or PLACEBO (Randomized in the TWILIGHT Research Study), Disp: , Rfl:  .  CALCIUM PO, Take 1 tablet by mouth daily. , Disp: , Rfl:  .  Cholecalciferol (VITAMIN D-3 PO), Take 1 tablet by mouth daily. , Disp: , Rfl:  .  loratadine (CLARITIN) 10 MG tablet, Take 10 mg by mouth daily., Disp: , Rfl:  .  metoprolol succinate (TOPROL-XL) 25 MG 24 hr tablet, Take 25 mg by mouth 2 (two) times daily., Disp: , Rfl:  .  Multiple Vitamins-Minerals (MULTIVITAMIN PO), Take 1 tablet by mouth daily. , Disp: , Rfl:  .  nitroGLYCERIN (NITROSTAT) 0.4 MG SL tablet, Dissolve 1 tablet under the tongue every 5 minutes for  3 doses as needed for chest pain, Disp: 100 tablet, Rfl: 0 .  omeprazole (PRILOSEC) 40 MG capsule, Take 1 capsule by mouth  daily, Disp: 90 capsule, Rfl: 0 .  ramipril (ALTACE) 10 MG capsule, Take 1 capsule (10 mg total) by mouth daily., Disp: 90 capsule, Rfl: 3 .  rosuvastatin (CRESTOR) 20 MG tablet, Take 1 tablet (20 mg total) by mouth daily., Disp: 90 tablet, Rfl: 3 .  triamcinolone (NASACORT) 55 MCG/ACT nasal inhaler, Place 2 sprays into the nose  daily., Disp: 3 Inhaler, Rfl: 3  Past Medical History: Past Medical History:  Diagnosis Date  . Asthma    infrequent problem  . Choroidal nevus of left eye    sees Dr. Gerarda Fraction at [facility].   . GERD (gastroesophageal reflux disease)   . Hx of colonic polyp   . Hyperlipidemia   . Hypertension   . Osteoarthritis   . Supraventricular tachycardia (HCC)     Tobacco Use: History  Smoking Status  . Never Smoker  Smokeless Tobacco  . Never Used    Labs: Recent Review Flowsheet Data    Labs for ITP Cardiac and Pulmonary Rehab Latest Ref Rng & Units 01/27/2015 02/07/2016 03/12/2016 05/11/2016 07/09/2016   Cholestrol 125 - 200 mg/dL 158 169 128 - 173   LDLCALC <130 mg/dL 101(H) 99 66 - 104   LDLDIRECT mg/dL - - - - -   HDL >=46 mg/dL 28.60(L) 32.50(L) 20(L) - 28(L)   Trlycerides <150 mg/dL 143.0 186.0(H) 210(H) - 206(H)   TCO2 0 - 100 mmol/L - - - 22 -      Capillary Blood Glucose: No results found for: GLUCAP   Exercise Target Goals:    Exercise Program Goal: Individual exercise prescription set with THRR, safety & activity barriers. Participant demonstrates ability to understand and report RPE using BORG scale, to self-measure pulse accurately, and to acknowledge the importance of the exercise prescription.  Exercise Prescription  Goal: Starting with aerobic activity 30 plus minutes a day, 3 days per week for initial exercise prescription. Provide home exercise prescription and guidelines that participant acknowledges understanding prior to discharge.  Activity Barriers & Risk Stratification:     Activity Barriers & Cardiac Risk Stratification - 05/24/16 0921      Activity Barriers & Cardiac Risk Stratification   Activity Barriers Left Knee Replacement;Other (comment)   Comments L achilles repair in 2012; R knee pain and L shoulder pain   Cardiac Risk Stratification High      6 Minute Walk:     6 Minute Walk    Row Name 05/24/16 1613         6 Minute  Walk   Phase Initial     Distance 800 feet     Walk Time 6 minutes     # of Rest Breaks 0     MPH 1.5     METS 1.7     RPE 12     VO2 Peak 5.99     Symptoms No     Resting HR 62 bpm     Resting BP 112/62     Max Ex. HR 87 bpm     Max Ex. BP 126/70     2 Minute Post BP 106/72        Initial Exercise Prescription:     Initial Exercise Prescription - 06/29/16 1700      Date of Initial Exercise RX and Referring Provider   Date 06/29/16     Recumbant Bike   Minutes 10     NuStep   Level 4   Minutes 10     Track   Minutes 10     Resistance Training   Training Prescription Yes   Reps 10-12      Perform Capillary Blood Glucose checks as needed.  Exercise Prescription Changes:     Exercise Prescription Changes    Row Name 06/08/16 0900 06/29/16 1700 07/27/16 0700         Exercise Review   Progression Yes Yes Yes       Response to Exercise   Blood Pressure (Admit) 108/72 122/78 114/68     Blood Pressure (Exercise) 120/80 120/80 116/72     Blood Pressure (Exit) 134/72 120/72 106/64     Heart Rate (Admit) 75 bpm 76 bpm 74 bpm     Heart Rate (Exercise) 97 bpm 100 bpm 100 bpm     Heart Rate (Exit) 71 bpm 78 bpm 74 bpm     Rating of Perceived Exertion (Exercise) 12 13 12      Symptoms none none none     Comments Reviewed home exercise guidelines on 06/04/16. Reviewed home exercise guidelines on 06/04/16. Reviewed home exercise guidelines on 06/04/16.     Duration Progress to 30 minutes of continuous aerobic without signs/symptoms of physical distress Progress to 30 minutes of continuous aerobic without signs/symptoms of physical distress Progress to 30 minutes of continuous aerobic without signs/symptoms of physical distress     Intensity THRR unchanged THRR unchanged THRR unchanged       Progression   Progression Continue to progress workloads to maintain intensity without signs/symptoms of physical distress. Continue to progress workloads to maintain intensity  without signs/symptoms of physical distress. Continue to progress workloads to maintain intensity without signs/symptoms of physical distress.     Average METs 2.1 2.9 3       Resistance Training   Training Prescription Yes  -  Yes     Weight 1lb 3lbs 2lbs     Reps 10-12  - 10-12       Interval Training   Interval Training  - No No       Recumbant Bike   Level 1.5 2 3      Watts  - 26 26     Minutes 10  - 10     METs 2.2 2.87 3.14       NuStep   Level 4  - 4     Minutes 10  - 10     METs 2.3 2.6 2.5       Arm Ergometer   Level -  -  -     Minutes -  -  -     METs -  -  -       Track   Laps 5 13 11      Minutes 10  - 10     METs 1.84 3.26 3.09       Home Exercise Plan   Plans to continue exercise at [place]     Frequency Add 4 additional days to program exercise sessions. Add 4 additional days to program exercise sessions. Add 4 additional days to program exercise sessions.        Exercise Comments:     Exercise Comments    Row Name 06/04/16 1632 06/29/16 1701 07/25/16 0945       Exercise Comments Off to a good start with exercise. Reviewed home exercise guidelines with patient. Making steady progress with exercise. Walking and using recumbent bike at home 20-25 minutes 2-3 days /week . Doing well with exercise, increasing workloads as tolerated.        Discharge Exercise Prescription (Final Exercise Prescription Changes):     Exercise Prescription Changes - 07/27/16 0700      Exercise Review   Progression Yes     Response to Exercise   Blood Pressure (Admit) 114/68   Blood Pressure (Exercise) 116/72   Blood Pressure (Exit) 106/64   Heart Rate (Admit) 74 bpm   Heart Rate (Exercise) 100 bpm   Heart Rate (Exit) 74 bpm   Rating of Perceived Exertion (Exercise) 12   Symptoms none   Comments Reviewed home exercise guidelines on 06/04/16.   Duration Progress to 30 minutes of continuous aerobic without signs/symptoms of physical distress   Intensity THRR  unchanged     Progression   Progression Continue to progress workloads to maintain intensity without signs/symptoms of physical distress.   Average METs 3     Resistance Training   Training Prescription Yes   Weight 2lbs   Reps 10-12     Interval Training   Interval Training No     Recumbant Bike   Level 3   Watts 26   Minutes 10   METs 3.14     NuStep   Level 4   Minutes 10   METs 2.5     Track   Laps 11   Minutes 10   METs 3.09     Home Exercise Plan   Plans to continue exercise at Home   Frequency Add 4 additional days to program exercise sessions.      Nutrition:  Target Goals: Understanding of nutrition guidelines, daily intake of sodium 1500mg , cholesterol 200mg , calories 30% from fat and 7% or less from saturated fats, daily to have 5 or more servings of fruits and vegetables.  Biometrics:  Pre Biometrics - 05/24/16 1618      Pre Biometrics   Waist Circumference 44.5 inches   Hip Circumference 46 inches   Waist to Hip Ratio 0.97 %   Triceps Skinfold 37 mm   % Body Fat 46.8 %   Grip Strength 31 kg   Flexibility 9 in   Single Leg Stand 19.84 seconds       Nutrition Therapy Plan and Nutrition Goals:     Nutrition Therapy & Goals - 05/25/16 1230      Nutrition Therapy   Diet Therapeutic Lifestyle Change     Personal Nutrition Goals   Personal Goal #1 1-2 lb wt loss/week to a goal wt loss of 6-24 lb at graduation from [employer]   Expected Outcomes Short Term Goal: Understand basic principles of dietary content, such as calories, fat, sodium, cholesterol and nutrients.;Long Term Goal: Adherence to prescribed nutrition plan.      Nutrition Discharge: Nutrition Scores:     Nutrition Assessments - 05/25/16 1230      MEDFICTS Scores   Pre Score 12      Nutrition Goals Re-Evaluation:   Psychosocial: Target Goals: Acknowledge presence or absence of depression, maximize coping skills, provide positive  support system. Participant is able to verbalize types and ability to use techniques and skills needed for reducing stress and depression.  Initial Review & Psychosocial Screening:     Initial Psych Review & Screening - 05/28/16 [place]? Yes   Comments --  Teygan helps care for her elderly mother and her 22 year old mother in law.     Barriers   Psychosocial barriers to participate in program There are no identifiable barriers or psychosocial needs.;The patient should benefit from training in stress management and relaxation.     Screening Interventions   Interventions Encouraged to exercise      Quality of Life Scores:     Quality of Life - 06/06/16 1016      Quality of Life Scores   Health/Function Pre --  concerned about recent heart event   Family Pre --  husband has heart problems wants son to loose weight   GLOBAL Pre --  Denies feeling depressed      PHQ-9: Recent Review Flowsheet Data    Depression screen [facility] 2/9 05/28/2016 07/13/2015   Decreased Interest 0 0   Down, Depressed, Hopeless 0 0   PHQ - 2 Score 0 0      Psychosocial Evaluation and Intervention:   Psychosocial Re-Evaluation:     Psychosocial Re-Evaluation    Row Name 06/07/16 0814 06/07/16 0830 06/08/16 0952 07/05/16 1358 08/02/16 1550     Psychosocial Re-Evaluation   Interventions Encouraged to attend Cardiac Rehabilitation for the exercise Stress management education  - Encouraged to attend Cardiac Rehabilitation for the exercise Encouraged to attend Cardiac Rehabilitation for the exercise   Continued Psychosocial Services Needed No  - No No No      Vocational Rehabilitation: Provide vocational rehab assistance to qualifying candidates.   Vocational Rehab Evaluation & Intervention:     Vocational Rehab - 05/28/16 1728      Initial Vocational Rehab Evaluation & Intervention   Assessment shows need for [place] just  retired from working on July the first.      Education: Education Goals: Education classes will be provided on a weekly basis, covering required topics.  Participant will state understanding/return demonstration of topics presented.  Learning Barriers/Preferences:     Learning Barriers/Preferences - 05/24/16 0920      Learning Barriers/Preferences   Learning Barriers Sight   Learning Preferences Skilled Demonstration      Education Topics: Count Your Pulse:  -Group instruction provided by verbal instruction, demonstration, patient participation and written materials to support subject.  Instructors address importance of being able to find your pulse and how to count your pulse when at home without a heart monitor.  Patients get hands on experience counting their pulse with staff help and individually.   Heart Attack, Angina, and Risk Factor Modification:  -Group instruction provided by verbal instruction, video, and written materials to support subject.  Instructors address signs and symptoms of angina and heart attacks.    Also discuss risk factors for heart disease and how to make changes to improve heart health risk factors. Flowsheet Row CARDIAC REHAB PHASE II EXERCISE from 07/18/2016 in [place]  Date  06/13/16  Instruction Review Code  2- meets goals/outcomes      Functional Fitness:  -Group instruction provided by verbal instruction, demonstration, patient participation, and written materials to support subject.  Instructors address safety measures for doing things around the house.  Discuss how to get up and down off the floor, how to pick things up properly, how to safely get out of a chair without assistance, and balance training. Flowsheet Row CARDIAC REHAB PHASE II EXERCISE from 07/18/2016 in [place]  Date  06/08/16  Educator  Seward Carol EP  Instruction Review Code  2- meets goals/outcomes       Meditation and Mindfulness:  -Group instruction provided by verbal instruction, patient participation, and written materials to support subject.  Instructor addresses importance of mindfulness and meditation practice to help reduce stress and improve awareness.  Instructor also leads participants through a meditation exercise.  Flowsheet Row CARDIAC REHAB PHASE II EXERCISE from 07/18/2016 in [place]  Date  06/20/16  Educator  Jeanella Craze  Instruction Review Code  2- meets goals/outcomes      Stretching for Flexibility and Mobility:  -Group instruction provided by verbal instruction, patient participation, and written materials to support subject.  Instructors lead participants through series of stretches that are designed to increase flexibility thus improving mobility.  These stretches are additional exercise for major muscle groups that are typically performed during regular warm up and cool down. Flowsheet Row CARDIAC REHAB PHASE II EXERCISE from 07/18/2016 in [place]  Date  06/15/16  Instruction Review Code  2- meets goals/outcomes      Hands Only CPR Anytime:  -Group instruction provided by verbal instruction, video, patient participation and written materials to support subject.  Instructors co-teach with AHA video for hands only CPR.  Participants get hands on experience with mannequins.   Nutrition I class: Heart Healthy Eating:  -Group instruction provided by PowerPoint slides, verbal discussion, and written materials to support subject matter. The instructor gives an explanation and review of the Therapeutic Lifestyle Changes diet recommendations, which includes a discussion on lipid goals, dietary fat, sodium, fiber, plant stanol/sterol esters, sugar, and the components of a well-balanced, healthy diet. Flowsheet Row CARDIAC REHAB PHASE II EXERCISE from 07/18/2016 in [place]   Date  06/26/16  Educator  RD  Instruction Review Code  2- meets goals/outcomes      Nutrition  II class: Lifestyle Skills:  -Group instruction provided by PowerPoint slides, verbal discussion, and written materials to support subject matter. The instructor gives an explanation and review of label reading, grocery shopping for heart health, heart healthy recipe modifications, and ways to make healthier choices when eating out. Flowsheet Row CARDIAC REHAB PHASE II EXERCISE from 07/18/2016 in [place]  Date  07/03/16  Educator  RD  Instruction Review Code  2- meets goals/outcomes      Diabetes Question & Answer:  -Group instruction provided by PowerPoint slides, verbal discussion, and written materials to support subject matter. The instructor gives an explanation and review of diabetes co-morbidities, pre- and post-prandial blood glucose goals, pre-exercise blood glucose goals, signs, symptoms, and treatment of hypoglycemia and hyperglycemia, and foot care basics.   Diabetes Blitz:  -Group instruction provided by PowerPoint slides, verbal discussion, and written materials to support subject matter. The instructor gives an explanation and review of the physiology behind type 1 and type 2 diabetes, diabetes medications and rational behind using different medications, pre- and post-prandial blood glucose recommendations and Hemoglobin A1c goals, diabetes diet, and exercise including blood glucose guidelines for exercising safely.    Portion Distortion:  -Group instruction provided by PowerPoint slides, verbal discussion, written materials, and food models to support subject matter. The instructor gives an explanation of serving size versus portion size, changes in portions sizes over the last 20 years, and what consists of a serving from each food group. Flowsheet Row CARDIAC REHAB PHASE II EXERCISE from 07/18/2016 in [place]   Date  05/30/16  Educator  RD  Instruction Review Code  2- meets goals/outcomes      Stress Management:  -Group instruction provided by verbal instruction, video, and written materials to support subject matter.  Instructors review role of stress in heart disease and how to cope with stress positively.   Flowsheet Row CARDIAC REHAB PHASE II EXERCISE from 07/18/2016 in [place]  Date  07/18/16  Instruction Review Code  2- meets goals/outcomes      Exercising on Your Own:  -Group instruction provided by verbal instruction, power point, and written materials to support subject.  Instructors discuss benefits of exercise, components of exercise, frequency and intensity of exercise, and end points for exercise.  Also discuss use of nitroglycerin and activating EMS.  Review options of places to exercise outside of rehab.  Review guidelines for sex with heart disease. Flowsheet Row CARDIAC REHAB PHASE II EXERCISE from 07/18/2016 in [place]  Date  07/11/16  Educator  Seward Carol  Instruction Review Code  2- meets goals/outcomes      Cardiac Drugs I:  -Group instruction provided by verbal instruction and written materials to support subject.  Instructor reviews cardiac drug classes: antiplatelets, anticoagulants, beta blockers, and statins.  Instructor discusses reasons, side effects, and lifestyle considerations for each drug class. Flowsheet Row CARDIAC REHAB PHASE II EXERCISE from 07/18/2016 in [place]  Date  06/06/16  Educator  pharmD   Instruction Review Code  2- meets goals/outcomes      Cardiac Drugs II:  -Group instruction provided by verbal instruction and written materials to support subject.  Instructor reviews cardiac drug classes: angiotensin converting enzyme inhibitors (ACE-I), angiotensin II receptor blockers (ARBs), nitrates, and calcium channel blockers.  Instructor  discusses reasons, side effects, and lifestyle considerations for each drug class. [place] REHAB PHASE  II EXERCISE from 07/18/2016 in [place]  Date  07/04/16  Educator  pharmacy  Instruction Review Code  2- meets goals/outcomes      Anatomy and Physiology of the Circulatory System:  -Group instruction provided by verbal instruction, video, and written materials to support subject.  Reviews functional anatomy of heart, how it relates to various diagnoses, and what role the heart plays in the overall system. Flowsheet Row CARDIAC REHAB PHASE II EXERCISE from 07/18/2016 in [place]  Date  06/27/16  Instruction Review Code  2- meets goals/outcomes      Knowledge Questionnaire Score:     Knowledge Questionnaire Score - 05/24/16 1618      Knowledge Questionnaire Score   Pre Score 24/24      Core Components/Risk Factors/Patient Goals at Admission:     Personal Goals and Risk Factors at Admission - 05/24/16 0924      Core Components/Risk Factors/Patient Goals on Admission    Weight Management Weight Loss;Yes   Intervention Weight Management: Develop a combined nutrition and exercise program designed to reach desired caloric intake, while maintaining appropriate intake of nutrient and fiber, sodium and fats, and appropriate energy expenditure required for the weight goal.;Weight Management: Provide education and appropriate resources to help participant work on and attain dietary goals.;Weight Management/Obesity: Establish reasonable short term and long term weight goals.;Obesity: Provide education and appropriate resources to help participant work on and attain dietary goals.   Expected Outcomes Short Term: Continue to assess and modify interventions until short term weight is achieved;Weight Maintenance: Understanding of the daily nutrition guidelines, which includes 25-35% calories from fat, 7% or less cal  from saturated fats, less than 200mg  cholesterol, less than 1.5gm of sodium, & 5 or more servings of fruits and vegetables daily;Weight Loss: Understanding of general recommendations for a balanced deficit meal plan, which promotes 1-2 lb weight loss per week and includes a negative energy balance of [phone] kcal/d;Understanding recommendations for meals to include 15-35% energy as protein, 25-35% energy from fat, 35-60% energy from carbohydrates, less than 200mg  of dietary cholesterol, 20-35 gm of total fiber daily;Understanding of distribution of calorie intake throughout the day with the consumption of 4-5 meals/snacks   Sedentary Yes   Intervention Provide advice, education, support and counseling about physical activity/exercise needs.;Develop an individualized exercise prescription for aerobic and resistive training based on initial evaluation findings, risk stratification, comorbidities and participant's personal goals.   Expected Outcomes Achievement of increased cardiorespiratory fitness and enhanced flexibility, muscular endurance and strength shown through measurements of functional capacity and personal statement of participant.   Increase Strength and Stamina Yes   Intervention Provide advice, education, support and counseling about physical activity/exercise needs.;Develop an individualized exercise prescription for aerobic and resistive training based on initial evaluation findings, risk stratification, comorbidities and participant's personal goals.   Expected Outcomes Achievement of increased cardiorespiratory fitness and enhanced flexibility, muscular endurance and strength shown through measurements of functional capacity and personal statement of participant.   Improve shortness of breath with ADL's Yes   Intervention Provide education, individualized exercise plan and daily activity instruction to help decrease symptoms of SOB with activities of daily living.   Expected Outcomes Short  Term: Achieves a reduction of symptoms when performing activities of daily living.   Hypertension Yes   Intervention Provide education on lifestyle modifcations including regular physical activity/exercise, weight management, moderate sodium restriction and increased consumption of fresh fruit, vegetables, and low fat dairy, alcohol moderation,  and smoking cessation.;Monitor prescription use compliance.   Expected Outcomes Short Term: Continued assessment and intervention until BP is < 140/69mm HG in hypertensive participants. < 130/62mm HG in hypertensive participants with diabetes, heart failure or chronic kidney disease.;Long Term: Maintenance of blood pressure at goal levels.   Lipids Yes   Expected Outcomes Short Term: Participant states understanding of desired cholesterol values and is compliant with medications prescribed. Participant is following exercise prescription and nutrition guidelines.;Long Term: Cholesterol controlled with medications as prescribed, with individualized exercise RX and with personalized nutrition plan. Value goals: LDL < 70mg , HDL > 40 mg.   Personal Goal Other Yes   Personal Goal short: Learn what to do for exercise and nutrition counseling long: maintaining an exercise routine and nutrition program (overall healthier lifestyle)   Intervention Provide nutrition counseling and exercise programming to improve cardiovascular fitness and overall quality of life   Expected Outcomes Pt will be able to live a healthier lifestlye and maintain exercise and nutrition program      Core Components/Risk Factors/Patient Goals Review:      Goals and Risk Factor Review    Row Name 06/04/16 1634 06/29/16 1705 07/27/16 0758         Core Components/Risk Factors/Patient Goals Review   Personal Goals Review Other Other Sedentary;Other     Review Home exercise guidelines given including stop signs, temperature guidelines, and heart rate and rate of perceived exertion parameters.  Pt has recumbent bike at home, which she will use for her home exercise guidelines. Patient has begun regular exercise routine: walking and using recumbent bike 20-25 minutes, 2-3 days/week. Doing well withhome exercise routine.Exercising at least 2-3 days at home     Expected Outcomes Exercise 3 dyas per week using recumbent bike and walking to achieve improved cardiorespiratory benefits. Exercise 3 dyas per week using recumbent bike and walking to achieve improved cardiorespiratory benefits. Exercise 3 dyas per week using recumbent bike and walking to achieve improved cardiorespiratory benefits.        Core Components/Risk Factors/Patient Goals at Discharge (Final Review):      Goals and Risk Factor Review - 07/27/16 0758      Core Components/Risk Factors/Patient Goals Review   Personal Goals Review Sedentary;Other   Review Doing well withhome exercise routine.Exercising at least 2-3 days at home   Expected Outcomes Exercise 3 dyas per week using recumbent bike and walking to achieve improved cardiorespiratory benefits.      ITP Comments:     ITP Comments    Row Name 05/24/16 [phone]           ITP Comments Dr. Fransico Him, Medical Director          Comments: Kathia is making expected progress toward personal goals after completing 26 sessions. Recommend continued exercise and life style modification education including  stress management and relaxation techniques to decrease cardiac risk profile. Barnet Pall, RN,BSN 08/02/2016 3:53 PM

## 2016-08-03 ENCOUNTER — Encounter (HOSPITAL_COMMUNITY): Admission: RE | Admit: 2016-08-03 | Payer: Medicare Other | Source: Ambulatory Visit

## 2016-08-03 ENCOUNTER — Encounter (HOSPITAL_COMMUNITY): Payer: Medicare Other

## 2016-08-06 ENCOUNTER — Other Ambulatory Visit: Payer: Self-pay | Admitting: Internal Medicine

## 2016-08-06 ENCOUNTER — Encounter (HOSPITAL_COMMUNITY): Payer: Medicare Other

## 2016-08-06 ENCOUNTER — Encounter (HOSPITAL_COMMUNITY)
Admission: RE | Admit: 2016-08-06 | Discharge: 2016-08-06 | Disposition: A | Discharge status: Home / Self Care | Payer: Medicare Other | Source: Ambulatory Visit | Attending: Internal Medicine | Admitting: Internal Medicine

## 2016-08-06 DIAGNOSIS — Z955 Presence of coronary angioplasty implant and graft: Secondary | ICD-10-CM

## 2016-08-06 DIAGNOSIS — I214 Non-ST elevation (NSTEMI) myocardial infarction: Secondary | ICD-10-CM | POA: Diagnosis not present

## 2016-08-08 ENCOUNTER — Encounter (HOSPITAL_COMMUNITY)
Admission: RE | Admit: 2016-08-08 | Discharge: 2016-08-08 | Disposition: A | Discharge status: Home / Self Care | Payer: Medicare Other | Source: Ambulatory Visit | Attending: Internal Medicine | Admitting: Internal Medicine

## 2016-08-08 ENCOUNTER — Encounter (HOSPITAL_COMMUNITY): Payer: Medicare Other

## 2016-08-08 DIAGNOSIS — Z955 Presence of coronary angioplasty implant and graft: Secondary | ICD-10-CM

## 2016-08-08 DIAGNOSIS — I214 Non-ST elevation (NSTEMI) myocardial infarction: Secondary | ICD-10-CM | POA: Diagnosis not present

## 2016-08-10 ENCOUNTER — Encounter (HOSPITAL_COMMUNITY): Payer: Medicare Other

## 2016-08-10 ENCOUNTER — Encounter (HOSPITAL_COMMUNITY)
Admission: RE | Admit: 2016-08-10 | Discharge: 2016-08-10 | Disposition: A | Discharge status: Home / Self Care | Payer: Medicare Other | Source: Ambulatory Visit | Attending: Internal Medicine | Admitting: Internal Medicine

## 2016-08-10 DIAGNOSIS — I214 Non-ST elevation (NSTEMI) myocardial infarction: Secondary | ICD-10-CM | POA: Diagnosis not present

## 2016-08-10 DIAGNOSIS — Z955 Presence of coronary angioplasty implant and graft: Secondary | ICD-10-CM

## 2016-08-13 ENCOUNTER — Encounter (HOSPITAL_COMMUNITY): Payer: Medicare Other

## 2016-08-13 ENCOUNTER — Encounter (HOSPITAL_COMMUNITY)
Admission: RE | Admit: 2016-08-13 | Discharge: 2016-08-13 | Disposition: A | Discharge status: Home / Self Care | Payer: Medicare Other | Source: Ambulatory Visit | Attending: Internal Medicine | Admitting: Internal Medicine

## 2016-08-13 DIAGNOSIS — I214 Non-ST elevation (NSTEMI) myocardial infarction: Secondary | ICD-10-CM | POA: Diagnosis not present

## 2016-08-13 DIAGNOSIS — Z955 Presence of coronary angioplasty implant and graft: Secondary | ICD-10-CM

## 2016-08-14 DIAGNOSIS — C6932 Malignant neoplasm of left choroid: Secondary | ICD-10-CM | POA: Insufficient documentation

## 2016-08-15 ENCOUNTER — Encounter (HOSPITAL_COMMUNITY)
Admission: RE | Admit: 2016-08-15 | Discharge: 2016-08-15 | Disposition: A | Discharge status: Home / Self Care | Payer: Medicare Other | Source: Ambulatory Visit | Attending: Internal Medicine | Admitting: Internal Medicine

## 2016-08-15 ENCOUNTER — Encounter (HOSPITAL_COMMUNITY): Payer: Medicare Other

## 2016-08-15 DIAGNOSIS — Z955 Presence of coronary angioplasty implant and graft: Secondary | ICD-10-CM

## 2016-08-15 DIAGNOSIS — I214 Non-ST elevation (NSTEMI) myocardial infarction: Secondary | ICD-10-CM | POA: Diagnosis not present

## 2016-08-16 ENCOUNTER — Telehealth: Payer: Self-pay | Admitting: Family Medicine

## 2016-08-16 NOTE — Telephone Encounter (Signed)
Pt scheduled  

## 2016-08-16 NOTE — Telephone Encounter (Signed)
Can you call pt schedule a 30 minute office visit? It is to discuss a follow up from [facility]. Dr. Sarajane Jews would like to go over results and any other tests that he might need to order.

## 2016-08-16 NOTE — Telephone Encounter (Signed)
lmwh to call the office to schedule a appointment with Dr. Sarajane Jews

## 2016-08-17 ENCOUNTER — Encounter (HOSPITAL_COMMUNITY)
Admission: RE | Admit: 2016-08-17 | Discharge: 2016-08-17 | Disposition: A | Discharge status: Home / Self Care | Payer: Medicare Other | Source: Ambulatory Visit | Attending: Internal Medicine | Admitting: Internal Medicine

## 2016-08-17 ENCOUNTER — Encounter (HOSPITAL_COMMUNITY): Payer: Medicare Other

## 2016-08-17 DIAGNOSIS — I214 Non-ST elevation (NSTEMI) myocardial infarction: Secondary | ICD-10-CM | POA: Diagnosis not present

## 2016-08-17 DIAGNOSIS — Z955 Presence of coronary angioplasty implant and graft: Secondary | ICD-10-CM

## 2016-08-20 ENCOUNTER — Encounter (HOSPITAL_COMMUNITY): Payer: Medicare Other

## 2016-08-20 ENCOUNTER — Encounter (HOSPITAL_COMMUNITY)
Admission: RE | Admit: 2016-08-20 | Discharge: 2016-08-20 | Disposition: A | Discharge status: Home / Self Care | Payer: Medicare Other | Source: Ambulatory Visit | Attending: Internal Medicine | Admitting: Internal Medicine

## 2016-08-20 DIAGNOSIS — I214 Non-ST elevation (NSTEMI) myocardial infarction: Secondary | ICD-10-CM | POA: Diagnosis not present

## 2016-08-20 DIAGNOSIS — Z955 Presence of coronary angioplasty implant and graft: Secondary | ICD-10-CM

## 2016-08-21 ENCOUNTER — Encounter: Payer: Self-pay | Admitting: Family Medicine

## 2016-08-21 ENCOUNTER — Ambulatory Visit (INDEPENDENT_AMBULATORY_CARE_PROVIDER_SITE_OTHER): Payer: Medicare Other | Admitting: Family Medicine

## 2016-08-21 VITALS — BP 127/79 | HR 79 | Temp 98.4°F | Ht 64.5 in | Wt 198.0 lb

## 2016-08-21 DIAGNOSIS — C6992 Malignant neoplasm of unspecified site of left eye: Secondary | ICD-10-CM | POA: Diagnosis not present

## 2016-08-21 LAB — CBC WITH DIFFERENTIAL/PLATELET
BASOS ABS: 0 10*3/uL (ref 0.0–0.1)
BASOS PCT: 0.3 % (ref 0.0–3.0)
EOS ABS: 0.1 10*3/uL (ref 0.0–0.7)
Eosinophils Relative: 1.7 % (ref 0.0–5.0)
HEMATOCRIT: 39.5 % (ref 36.0–46.0)
Hemoglobin: 13.3 g/dL (ref 12.0–15.0)
LYMPHS ABS: 1.3 10*3/uL (ref 0.7–4.0)
LYMPHS PCT: 20.9 % (ref 12.0–46.0)
MCHC: 33.8 g/dL (ref 30.0–36.0)
MCV: 83.3 fl (ref 78.0–100.0)
MONO ABS: 0.6 10*3/uL (ref 0.1–1.0)
Monocytes Relative: 9.3 % (ref 3.0–12.0)
NEUTROS ABS: 4.3 10*3/uL (ref 1.4–7.7)
NEUTROS PCT: 67.8 % (ref 43.0–77.0)
PLATELETS: 233 10*3/uL (ref 150.0–400.0)
RBC: 4.74 Mil/uL (ref 3.87–5.11)
RDW: 15.1 % (ref 11.5–15.5)
WBC: 6.3 10*3/uL (ref 4.0–10.5)

## 2016-08-21 LAB — BASIC METABOLIC PANEL
BUN: 15 mg/dL (ref 6–23)
CALCIUM: 9.7 mg/dL (ref 8.4–10.5)
CHLORIDE: 100 meq/L (ref 96–112)
CO2: 28 meq/L (ref 19–32)
CREATININE: 0.56 mg/dL (ref 0.40–1.20)
GFR: 115.01 mL/min (ref >60.00)
Glucose, Bld: 81 mg/dL (ref 70–99)
Potassium: 4.2 mEq/L (ref 3.5–5.1)
Sodium: 136 mEq/L (ref 135–145)

## 2016-08-21 LAB — HEPATIC FUNCTION PANEL
ALK PHOS: 52 U/L (ref 39–117)
ALT: 18 U/L (ref 0–35)
AST: 20 U/L (ref 0–37)
Albumin: 4.1 g/dL (ref 3.5–5.2)
BILIRUBIN DIRECT: 0.1 mg/dL (ref 0.0–0.3)
BILIRUBIN TOTAL: 0.5 mg/dL (ref 0.2–1.2)
TOTAL PROTEIN: 7.7 g/dL (ref 6.0–8.3)

## 2016-08-21 NOTE — Progress Notes (Signed)
Pre visit review using our clinic review tool, if applicable. No additional management support is needed unless otherwise documented below in the visit note. 

## 2016-08-21 NOTE — Progress Notes (Signed)
   Subjective:    Patient ID: Natalie Hall, female    DOB: [birth year], 65 y.o.   MRN: [id number]  HPI Here at the direction of Dr. Gerarda Fraction of [facility] for testing. 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 However on serial [name] studies it was shown that the thickness of this nevus was steadily increasing, from 2.3 mm a year ago to 2.91 mm a few weeks ago. 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 She is scheduled to have a radioactive plaque applied to the lesion per Dr. Cordelia Pen on 09-17-16 and that way they hope to avoid an enucleation surgery. There is no eye pain and it does not affect her vision. Dr. Cordelia Pen has asked [name] to help screen her for any metastases, since lung and liver are by far the most likely places to find this.    Review of Systems  Constitutional: Negative.   Eyes: Negative.   Respiratory: Negative.   Cardiovascular: Negative.   Gastrointestinal: Negative.   Neurological: Negative.        Objective:   Physical Exam  Constitutional: She is oriented to person, place, and time. She appears well-developed and well-nourished.  Neck: No thyromegaly present.  Cardiovascular: Normal rate, regular rhythm, normal heart sounds and intact distal pulses.   Pulmonary/Chest: Effort normal and breath sounds normal. No respiratory distress. She has no wheezes. She has no rales.  Abdominal: Soft. Bowel sounds are normal. She exhibits no distension and no mass. There is no tenderness. There is no rebound and no guarding.  Lymphadenopathy:    She has no cervical adenopathy.  Neurological: She is alert and oriented to person, place, and time.          Assessment & Plan:  Recent diagnosis of left choroidal melanoma. We will send her for labs today including a liver panel and we will set up CT scans of chest and abdomen. Laurey Morale, MD

## 2016-08-22 ENCOUNTER — Encounter (HOSPITAL_COMMUNITY)
Admission: RE | Admit: 2016-08-22 | Discharge: 2016-08-22 | Disposition: A | Discharge status: Home / Self Care | Payer: Medicare Other | Source: Ambulatory Visit | Attending: Internal Medicine | Admitting: Internal Medicine

## 2016-08-22 ENCOUNTER — Encounter (HOSPITAL_COMMUNITY): Payer: Medicare Other

## 2016-08-22 DIAGNOSIS — I214 Non-ST elevation (NSTEMI) myocardial infarction: Secondary | ICD-10-CM | POA: Diagnosis not present

## 2016-08-22 DIAGNOSIS — Z955 Presence of coronary angioplasty implant and graft: Secondary | ICD-10-CM | POA: Diagnosis present

## 2016-08-22 HISTORY — PX: EYE SURGERY: SHX253

## 2016-08-24 ENCOUNTER — Ambulatory Visit
Admission: RE | Admit: 2016-08-24 | Discharge: 2016-08-24 | Disposition: A | Discharge status: Home / Self Care | Payer: Medicare Other | Source: Ambulatory Visit | Attending: Family Medicine | Admitting: Family Medicine

## 2016-08-24 ENCOUNTER — Encounter (HOSPITAL_COMMUNITY): Payer: Medicare Other

## 2016-08-24 DIAGNOSIS — C6992 Malignant neoplasm of unspecified site of left eye: Secondary | ICD-10-CM

## 2016-08-24 MED ORDER — IOPAMIDOL (ISOVUE-300) INJECTION 61%
100.0000 mL | Freq: Once | INTRAVENOUS | Status: AC | PRN
  Administered 2016-08-24: 100 mL via INTRAVENOUS

## 2016-08-27 ENCOUNTER — Telehealth: Payer: Self-pay | Admitting: Family Medicine

## 2016-08-27 ENCOUNTER — Encounter (HOSPITAL_COMMUNITY): Payer: Medicare Other

## 2016-08-27 ENCOUNTER — Encounter (HOSPITAL_COMMUNITY)
Admission: RE | Admit: 2016-08-27 | Discharge: 2016-08-27 | Disposition: A | Discharge status: Home / Self Care | Payer: Medicare Other | Source: Ambulatory Visit | Attending: Internal Medicine | Admitting: Internal Medicine

## 2016-08-27 ENCOUNTER — Telehealth: Payer: Self-pay | Admitting: *Deleted

## 2016-08-27 DIAGNOSIS — Z955 Presence of coronary angioplasty implant and graft: Secondary | ICD-10-CM

## 2016-08-27 DIAGNOSIS — I214 Non-ST elevation (NSTEMI) myocardial infarction: Secondary | ICD-10-CM | POA: Diagnosis not present

## 2016-08-27 NOTE — Telephone Encounter (Signed)
° °  Pt call to say she received her results from her CT scan and wanted Dr Sarajane Jews to know hat her brother died from lung cancer 2 years ago.

## 2016-08-27 NOTE — Telephone Encounter (Signed)
Received call from patient stating that she has been diagnosed with "melanoma in her eye" and will be having surgery 09/17/16 @ [facility]. A radioactive implant will be placed close to eye tumor. She states she "just wanted to let the Research Department know". I wished her well and encouraged patient to contact her cardiology office because she may need cardiac clearance but would need guidance for Antiplatelet management. She states that her surgeon will be contact cardiology for her records, but she will also make a call as well.

## 2016-08-28 NOTE — Telephone Encounter (Signed)
Noted  

## 2016-08-28 NOTE — Progress Notes (Signed)
Discharge Summary  Patient Details  Name: Natalie Hall MRN: [id number] Date of Birth: 02-02-1951 Referring Provider:   Flowsheet Row CARDIAC REHAB PHASE II ORIENTATION from 05/24/2016 in [place]  Referring Provider  Lyman Bishop       Number of Visits: 36  Reason for Discharge:  Patient independent in their exercise.  Smoking History:  History  Smoking Status  . Never Smoker  Smokeless Tobacco  . Never Used    Diagnosis:  03/14/16 Status post coronary artery stent placement  ADL [facility]SD:   Initial Exercise Prescription:     Initial Exercise Prescription - 06/29/16 1700      Date of Initial Exercise RX and Referring Provider   Date 06/29/16     Recumbant Bike   Minutes 10     NuStep   Level 4   Minutes 10     Track   Minutes 10     Resistance Training   Training Prescription Yes   Reps 10-12      Discharge Exercise Prescription (Final Exercise Prescription Changes):     Exercise Prescription Changes - 08/29/16 1200      Exercise Review   Progression Yes     Response to Exercise   Blood Pressure (Admit) 142/78   Blood Pressure (Exercise) 150/80   Blood Pressure (Exit) 136/82   Heart Rate (Admit) 84 bpm   Heart Rate (Exercise) 108 bpm   Heart Rate (Exit) 83 bpm   Rating of Perceived Exertion (Exercise) 11   Symptoms none   Comments Reviewed home exercise guidelines on 06/04/16.   Duration Progress to 30 minutes of continuous aerobic without signs/symptoms of physical distress   Intensity THRR unchanged     Progression   Progression Continue to progress workloads to maintain intensity without signs/symptoms of physical distress.   Average METs 3.1     Resistance Training   Training Prescription Yes   Weight 3lbs   Reps 10-12     Interval Training   Interval Training No     Recumbant Bike   Level 3   Watts 32   Minutes 10   METs 3.14     NuStep   Level 3   Minutes 10   METs 2.8     Track   Laps 13   Minutes 10   METs 3.26     Home Exercise Plan   Plans to continue exercise at Home   Frequency Add 4 additional days to program exercise sessions.      Functional Capacity:     6 Minute Walk    Row Name 05/24/16 1613 08/17/16 1058       6 Minute Walk   Phase Initial Discharge    Distance 800 feet 1458 feet    Distance % Change  - 82.25 %    Walk Time 6 minutes 6 minutes    # of Rest Breaks 0 0    MPH 1.5 2.76    METS 1.7 3.04    RPE 12 11    VO2 Peak 5.99 10.64    Symptoms No No    Resting HR 62 bpm 84 bpm    Resting BP 112/62 100/70    Max Ex. HR 87 bpm 102 bpm    Max Ex. BP 126/70 130/68    2 Minute Post BP 106/72  -       Psychological, QOL, Others - Outcomes: PHQ 2/9: Depression screen [facility] 2/9 08/28/2016 05/28/2016 07/13/2015  Decreased Interest 0 0 0  Down, Depressed, Hopeless 0 0 0  PHQ - 2 Score 0 0 0    Quality of Life:     Quality of Life - 08/27/16 1132      Quality of Life Scores   Health/Function Pre 18.53 %   Health/Function Post 26.8 %   Health/Function % Change 44.63 %   Socioeconomic Pre 23.36 %   Socioeconomic Post 28.29 %   Socioeconomic % Change  21.1 %   Psych/Spiritual Pre 20.57 %   Psych/Spiritual Post 24.86 %   Psych/Spiritual % Change 20.86 %   Family Pre 16.8 %   Family Post 24 %   Family % Change 42.86 %   GLOBAL Pre 19.69 %   GLOBAL Post 26.29 %   GLOBAL % Change 33.52 %      Personal Goals: Goals established at orientation with interventions provided to work toward goal.     Personal Goals and Risk Factors at Admission - 05/24/16 0924      Core Components/Risk Factors/Patient Goals on Admission    Weight Management Weight Loss;Yes   Intervention Weight Management: Develop a combined nutrition and exercise program designed to reach desired caloric intake, while maintaining appropriate intake of nutrient and fiber, sodium and fats, and appropriate energy expenditure required for the weight goal.;Weight  Management: Provide education and appropriate resources to help participant work on and attain dietary goals.;Weight Management/Obesity: Establish reasonable short term and long term weight goals.;Obesity: Provide education and appropriate resources to help participant work on and attain dietary goals.   Expected Outcomes Short Term: Continue to assess and modify interventions until short term weight is achieved;Weight Maintenance: Understanding of the daily nutrition guidelines, which includes 25-35% calories from fat, 7% or less cal from saturated fats, less than 215m cholesterol, less than 1.5gm of sodium, & 5 or more servings of fruits and vegetables daily;Weight Loss: Understanding of general recommendations for a balanced deficit meal plan, which promotes 1-2 lb weight loss per week and includes a negative energy balance of [phone] kcal/d;Understanding recommendations for meals to include 15-35% energy as protein, 25-35% energy from fat, 35-60% energy from carbohydrates, less than 2043mof dietary cholesterol, 20-35 gm of total fiber daily;Understanding of distribution of calorie intake throughout the day with the consumption of 4-5 meals/snacks   Sedentary Yes   Intervention Provide advice, education, support and counseling about physical activity/exercise needs.;Develop an individualized exercise prescription for aerobic and resistive training based on initial evaluation findings, risk stratification, comorbidities and participant's personal goals.   Expected Outcomes Achievement of increased cardiorespiratory fitness and enhanced flexibility, muscular endurance and strength shown through measurements of functional capacity and personal statement of participant.   Increase Strength and Stamina Yes   Intervention Provide advice, education, support and counseling about physical activity/exercise needs.;Develop an individualized exercise prescription for aerobic and resistive training based on initial  evaluation findings, risk stratification, comorbidities and participant's personal goals.   Expected Outcomes Achievement of increased cardiorespiratory fitness and enhanced flexibility, muscular endurance and strength shown through measurements of functional capacity and personal statement of participant.   Improve shortness of breath with ADL's Yes   Intervention Provide education, individualized exercise plan and daily activity instruction to help decrease symptoms of SOB with activities of daily living.   Expected Outcomes Short Term: Achieves a reduction of symptoms when performing activities of daily living.   Hypertension Yes   Intervention Provide education on lifestyle modifcations including regular physical activity/exercise, weight management, moderate sodium  restriction and increased consumption of fresh fruit, vegetables, and low fat dairy, alcohol moderation, and smoking cessation.;Monitor prescription use compliance.   Expected Outcomes Short Term: Continued assessment and intervention until BP is < 140/50m HG in hypertensive participants. < 130/839mHG in hypertensive participants with diabetes, heart failure or chronic kidney disease.;Long Term: Maintenance of blood pressure at goal levels.   Lipids Yes   Expected Outcomes Short Term: Participant states understanding of desired cholesterol values and is compliant with medications prescribed. Participant is following exercise prescription and nutrition guidelines.;Long Term: Cholesterol controlled with medications as prescribed, with individualized exercise RX and with personalized nutrition plan. Value goals: LDL < 7099mHDL > 40 mg.   Personal Goal Other Yes   Personal Goal short: Learn what to do for exercise and nutrition counseling long: maintaining an exercise routine and nutrition program (overall healthier lifestyle)   Intervention Provide nutrition counseling and exercise programming to improve cardiovascular fitness and overall  quality of life   Expected Outcomes Pt will be able to live a healthier lifestlye and maintain exercise and nutrition program       Personal Goals Discharge:     Goals and Risk Factor Review    Row Name 06/04/16 1634 06/29/16 1705 07/27/16 0758 08/08/16 1651 08/29/16 1211     Core Components/Risk Factors/Patient Goals Review   Personal Goals Review Other Other Sedentary;Other Other Other;Weight Management/Obesity;Sedentary;Increase Strength and Stamina   Review Home exercise guidelines given including stop signs, temperature guidelines, and heart rate and rate of perceived exertion parameters. Pt has recumbent bike at home, which she will use for her home exercise guidelines. Patient has begun regular exercise routine: walking and using recumbent bike 20-25 minutes, 2-3 days/week. Doing well withhome exercise routine.Exercising at least 2-3 days at home Doing well withhome exercise routine.Exercising at least 2-3 days at home MarAlyannas made excellent progress with exercise. Aerobic capacity inceased 82.25% as evidenced by 6 minute walk test results. She los 3.3lbs and has an established exercise routine walking and riding her recumbent bike at home.   Expected Outcomes Exercise 3 dyas per week using recumbent bike and walking to achieve improved cardiorespiratory benefits. Exercise 3 dyas per week using recumbent bike and walking to achieve improved cardiorespiratory benefits. Exercise 3 dyas per week using recumbent bike and walking to achieve improved cardiorespiratory benefits. Exercise 3 dyas per week using recumbent bike and walking to achieve improved cardiorespiratory benefits. Continue exercise program at home 3-5 days per week using recumbent bike and walking to maintain heart healthy benefits including improved cardiorespiratory fitness and weight loss.      Nutrition & Weight - Outcomes:     Pre Biometrics - 05/24/16 1618      Pre Biometrics   Waist Circumference 44.5 inches    Hip Circumference 46 inches   Waist to Hip Ratio 0.97 %   Triceps Skinfold 37 mm   % Body Fat 46.8 %   Grip Strength 31 kg   Flexibility 9 in   Single Leg Stand 19.84 seconds         Post Biometrics - 08/29/16 1158       Post  Biometrics   Height _0  (1.651 m)   Weight 199 lb 8.3 oz (90.5 kg)   Waist Circumference 42.75 inches   Hip Circumference 46.75 inches   Waist to Hip Ratio 0.91 %   BMI (Calculated) 33.3   Triceps Skinfold 40 mm   % Body Fat 46.3 %   Grip Strength 30.5  kg   Flexibility 11 in   Single Leg Stand 26.21 seconds      Nutrition:     Nutrition Therapy & Goals - 05/25/16 1230      Nutrition Therapy   Diet Therapeutic Lifestyle Change     Personal Nutrition Goals   Personal Goal #1 1-2 lb wt loss/week to a goal wt loss of 6-24 lb at graduation from [place]   Expected Outcomes Short Term Goal: Understand basic principles of dietary content, such as calories, fat, sodium, cholesterol and nutrients.;Long Term Goal: Adherence to prescribed nutrition plan.      Nutrition Discharge:     Nutrition Assessments - 08/31/16 1348      MEDFICTS Scores   Pre Score 12   Post Score 3   Score Difference -9      Education Questionnaire Score:     Knowledge Questionnaire Score - 08/27/16 1132      Knowledge Questionnaire Score   Pre Score 24/24   Post Score 24/24      Goals reviewed with patient; copy given to patient.Pt graduates tomorrow from cardiac rehab program today with completion of 36 exercise sessions in Phase II. Pt maintained good attendance and progressed nicely during her participation in rehab as evidenced by increased MET level.   Medication list reconciled. Repeat  PHQ score- 0 .  Pt has made significant lifestyle changes and should be commended for her success. Pt feels she has achieved her goals during cardiac rehab.   Marsha  plans to continue exercise in cardiac maintenance program. Rosann Auerbach has been  diagnosed with Choroidal Melanoma and will be going to [facility] for treatment. Elley plans to continue exercise by walking and using her recumbent bike at home. Gara also plans to participate in [employer].Harrell Gave RN BSN

## 2016-08-29 ENCOUNTER — Encounter (HOSPITAL_COMMUNITY): Payer: Medicare Other

## 2016-08-29 ENCOUNTER — Encounter (HOSPITAL_COMMUNITY)
Admission: RE | Admit: 2016-08-29 | Discharge: 2016-08-29 | Disposition: A | Discharge status: Home / Self Care | Payer: Medicare Other | Source: Ambulatory Visit | Attending: Internal Medicine | Admitting: Internal Medicine

## 2016-08-29 VITALS — BP 142/78 | HR 84 | Ht 65.0 in | Wt 199.5 lb

## 2016-08-29 DIAGNOSIS — I214 Non-ST elevation (NSTEMI) myocardial infarction: Secondary | ICD-10-CM | POA: Diagnosis not present

## 2016-08-29 DIAGNOSIS — Z955 Presence of coronary angioplasty implant and graft: Secondary | ICD-10-CM

## 2016-08-31 ENCOUNTER — Encounter (HOSPITAL_COMMUNITY): Payer: Medicare Other

## 2016-08-31 ENCOUNTER — Encounter (HOSPITAL_COMMUNITY): Admission: RE | Admit: 2016-08-31 | Payer: Medicare Other | Source: Ambulatory Visit

## 2016-09-03 ENCOUNTER — Encounter (HOSPITAL_COMMUNITY): Payer: Medicare Other

## 2016-09-05 ENCOUNTER — Encounter (HOSPITAL_COMMUNITY): Payer: Medicare Other

## 2016-09-07 ENCOUNTER — Encounter (HOSPITAL_COMMUNITY): Payer: Medicare Other

## 2016-09-17 HISTORY — PX: RADIOACTIVE PLAQUE INSERTION: SHX2288

## 2016-09-21 HISTORY — PX: RADIOACTIVE PLAQUE REMOVAL: SHX2289

## 2016-10-01 ENCOUNTER — Other Ambulatory Visit: Payer: Self-pay | Admitting: Cardiology

## 2016-10-01 LAB — CBC WITH DIFFERENTIAL/PLATELET
Basophils Absolute: 0 cells/uL (ref 0–200)
Basophils Relative: 0 %
Eosinophils Absolute: 110 cells/uL (ref 15–500)
Eosinophils Relative: 2 %
HCT: 40 % (ref 35.0–45.0)
HEMOGLOBIN: 12.9 g/dL (ref 11.7–15.5)
LYMPHS ABS: 1430 {cells}/uL (ref 850–3900)
Lymphocytes Relative: 26 %
MCH: 27.8 pg (ref 27.0–33.0)
MCHC: 32.3 g/dL (ref 32.0–36.0)
MCV: 86.2 fL (ref 80.0–100.0)
MPV: 9.9 fL (ref 7.5–12.5)
Monocytes Absolute: 440 cells/uL (ref 200–950)
Monocytes Relative: 8 %
NEUTROS PCT: 64 %
Neutro Abs: 3520 cells/uL (ref 1500–7800)
Platelets: 216 10*3/uL (ref 140–400)
RBC: 4.64 MIL/uL (ref 3.80–5.10)
RDW: 15 % (ref 11.0–15.0)
WBC: 5.5 10*3/uL (ref 3.8–10.8)

## 2016-10-01 LAB — BASIC METABOLIC PANEL
BUN: 15 mg/dL (ref 7–25)
CO2: 27 mmol/L (ref 20–31)
Calcium: 9.3 mg/dL (ref 8.6–10.4)
Chloride: 102 mmol/L (ref 98–110)
Creat: 0.59 mg/dL (ref 0.50–0.99)
GLUCOSE: 92 mg/dL (ref 65–99)
POTASSIUM: 4.5 mmol/L (ref 3.5–5.3)
SODIUM: 137 mmol/L (ref 135–146)

## 2016-10-02 ENCOUNTER — Other Ambulatory Visit: Payer: Self-pay | Admitting: Family Medicine

## 2016-10-02 NOTE — Telephone Encounter (Signed)
Refill request for Omeprazole 40 mg and a 90 day supply to [facility].

## 2016-10-03 MED ORDER — OMEPRAZOLE 40 MG PO CPDR: 40.0000 mg | DELAYED_RELEASE_CAPSULE | Freq: Every day | ORAL | 1 refills | Status: DC

## 2016-10-03 NOTE — Telephone Encounter (Signed)
I sent script e-scribe to [facility]. 

## 2016-10-05 ENCOUNTER — Encounter: Payer: Self-pay | Admitting: Internal Medicine

## 2016-10-05 ENCOUNTER — Ambulatory Visit (INDEPENDENT_AMBULATORY_CARE_PROVIDER_SITE_OTHER): Payer: Medicare Other | Admitting: Internal Medicine

## 2016-10-05 VITALS — BP 118/80 | HR 66 | Ht 65.0 in | Wt 198.6 lb

## 2016-10-05 DIAGNOSIS — Z006 Encounter for examination for normal comparison and control in clinical research program: Secondary | ICD-10-CM

## 2016-10-05 DIAGNOSIS — E782 Mixed hyperlipidemia: Secondary | ICD-10-CM

## 2016-10-05 DIAGNOSIS — I2510 Atherosclerotic heart disease of native coronary artery without angina pectoris: Secondary | ICD-10-CM | POA: Diagnosis not present

## 2016-10-05 DIAGNOSIS — I471 Supraventricular tachycardia: Secondary | ICD-10-CM | POA: Diagnosis not present

## 2016-10-05 LAB — LIPID PANEL
Cholesterol: 152 mg/dL (ref <200)
HDL: 28 mg/dL — AB (ref >50)
LDL CALC: 87 mg/dL (ref <100)
TRIGLYCERIDES: 183 mg/dL — AB (ref <150)
Total CHOL/HDL Ratio: 5.4 Ratio — ABNORMAL HIGH (ref <5.0)
VLDL: 37 mg/dL — AB (ref <30)

## 2016-10-05 NOTE — Patient Instructions (Signed)
Your physician wants you to follow-up in: August 2018 with Dr. Debara Pickett. You will receive a reminder letter in the mail two months in advance. If you don't receive a letter, please call our office to schedule the follow-up appointment.

## 2016-10-05 NOTE — Progress Notes (Signed)
OFFICE NOTE  Chief Complaint:  routine follow-up  Primary Care Physician: Laurey Morale, MD  HPI:  Natalie Hall is a 65 y.o. female who I last saw in the hospital a few weeks ago. She was admitted for an SVT which spontaneously converted in the ER on Cardizem and did not return. Unfortunately she had a non-ST elevation MI with troponin peak of 1.64. She underwent a Lexiscan Myoview which demonstrated a moderate region of reversibility in the mid and apical segments of the inferior lateral Straw consistent with ischemia. EF was 61%. She then was referred to cardiac catheterization which demonstrated the following:  Cardiac Cath 05/24   Prox RCA lesion, 25% stenosed.  The left ventricular systolic function is normal.  Prox LAD to Mid LAD lesion, 15% stenosed.  Mid RCA-1 lesion, 95% stenosed.  Mid RCA-2 lesion, 95% stenosed. Post intervention, there is a 0% residual stenosis.SYNERGY DES 3X32 1. Single vessel obstructive CAD 2. Normal LV function 3. Successful stenting of the mid to distal RCA with a DES, SYNERGY DES 3X32 Plan: DAPT for one year. Patient may be a candidate for the TWILIGHT trial in which case study protocol will be followed. Anticipate DC in am.  Natalie Hall returns today and is doing quite well. She denies any chest pain or worsening shortness of breath. She is compliant with her medications. She did enroll in the TWILIGHT trial. Unfortunately, she was considering shoulder surgery but this will be delayed due to the necessity of dual antiplatelet therapy with her recent stent. I advised her the Tylenol or her tramadol would be safe medicines used for pain.  07/13/2016  Since I last saw her, she was admitted to the hospital for adenosine-responsive AVNRT. She underwent successful catheter ablation and has not had recurrence. She denies any chest pain or dyspnea. She remains in the TWILIGHT trial with Brillinta. Recent lipid profilw shows LDL remains not at goal at  104. HDL low at 28. She is on Crestor 10 mg daily.  10/05/2016  Natalie Hall returns today for follow-up. She seems to be doing well from a coronary standpoint. She denies any chest pain or worsening dyspnea. 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 She underwent placement of a radioactive seed which was ultimately removed after a week and she is recovering from that procedure. She was off of her aspirin for 1 week but remained on Brilinta. She is still in the twilight trial. Her cholesterol was higher than it had been previously. I increased her Crestor to 20 mg daily and we are awaiting her lipid profile which was recently drawn. She denies any recurrent AVNRT.  PMHx:  Past Medical History:  Diagnosis Date  . Asthma    infrequent problem  . Choroidal nevus of left eye    sees Dr. Gerarda Fraction at [facility].   . GERD (gastroesophageal reflux disease)   . Hx of colonic polyp   . Hyperlipidemia   . Hypertension   . Melanoma of eye, left ([place])    sees Dr. Cordelia Pen  . Osteoarthritis   . Supraventricular tachycardia [facility])     Past Surgical History:  Procedure Laterality Date  . ABDOMINAL HYSTERECTOMY    . ACHILLES TENDON REPAIR  10-07-11   torn on right, per Dr. Noemi Chapel   . ANKLE SURGERY     torn tendon left ankle  . BREAST BIOPSY    . CARDIAC CATHETERIZATION N/A 03/14/2016   Procedure: Left Heart  Cath and Coronary Angiography;  Surgeon: Peter M Martinique, MD;  Location: [facility];  Service: Cardiovascular;  Laterality: N/A;  . CARDIAC CATHETERIZATION N/A 03/14/2016   Procedure: Coronary Stent Intervention;  Surgeon: Peter M Martinique, MD;  Location: [facility];  Service: Cardiovascular;  Laterality: N/A;  . CARPAL TUNNEL RELEASE    . CHOLECYSTECTOMY    . COLONOSCOPY  06-23-14   per Dr. Ardis Hughs, adenomatous polyps, repeat in 5 yrs    . ELECTROPHYSIOLOGIC STUDY N/A 05/21/2016   Procedure: SVT Ablation;  Surgeon: [name], MD;   Location: [facility];  Service: Cardiovascular;  Laterality: N/A;  . KNEE CLOSED REDUCTION Left 02/03/2014   Procedure: CLOSED MANIPULATION LEFT KNEE;  Surgeon: Gearlean Alf, MD;  Location: [facility];  Service: Orthopedics;  Laterality: Left;  . POLYPECTOMY    . TOTAL KNEE ARTHROPLASTY Left 12/21/2013   Procedure: LEFT TOTAL KNEE ARTHROPLASTY;  Surgeon: Gearlean Alf, MD;  Location: [facility];  Service: Orthopedics;  Laterality: Left;  Marland Kitchen VARICOSE VEIN SURGERY      FAMHx:  Family History  Problem Relation Age of Onset  . Colon polyps Mother   . Colon cancer Maternal Aunt   . Liver cancer Maternal Aunt   . Breast cancer    . Coronary artery disease    . Colon cancer    . Diabetes    . Hypertension    . Kidney disease    . Lung cancer    . Lung cancer Brother     spleen, bone  . Esophageal cancer Neg Hx   . Stomach cancer Neg Hx   . Rectal cancer Neg Hx     SOCHx:   reports that she has never smoked. She has never used smokeless tobacco. She reports that she does not drink alcohol or use drugs.  ALLERGIES:  Allergies  Allergen Reactions  . Levaquin [Levofloxacin In D5w] Other (See Comments)    Leg pain   . Augmentin [Amoxicillin-Pot Clavulanate] Rash  . Codeine Other (See Comments)    flushing  . Lexapro [Escitalopram Oxalate] Rash    ROS: Pertinent items noted in HPI and remainder of comprehensive ROS otherwise negative.  HOME MEDS: Current Outpatient Prescriptions  Medication Sig Dispense Refill  . AMBULATORY NON FORMULARY MEDICATION Take 90 mg by mouth 2 (two) times daily. Medication Name: BRILINTA 90 mg BID (TWILIGHT Research study PROVIDED)    . AMBULATORY NON FORMULARY MEDICATION Take 81 mg by mouth daily. Medication Name: Aspirin 81 mg Daily or PLACEBO (Randomized in the [facility])    . CALCIUM PO Take 1 tablet by mouth daily.     . Cholecalciferol (VITAMIN D-3 PO) Take 1 tablet by mouth daily.     Marland Kitchen loratadine (CLARITIN) 10 MG tablet Take 10  mg by mouth daily.    . metoprolol succinate (TOPROL-XL) 25 MG 24 hr tablet Take 25 mg by mouth 2 (two) times daily.    . Multiple Vitamins-Minerals (MULTIVITAMIN PO) Take 1 tablet by mouth daily.     . nitroGLYCERIN (NITROSTAT) 0.4 MG SL tablet DISSOLVE 1 TABLET UNDER THE TONGUE EVERY 5 MINUTES FOR  3 DOSES AS NEEDED FOR CHEST PAIN 100 tablet 0  . omeprazole (PRILOSEC) 40 MG capsule Take 1 capsule (40 mg total) by mouth daily. 90 capsule 1  . ramipril (ALTACE) 10 MG capsule Take 1 capsule (10 mg total) by mouth daily. 90 capsule 3  . rosuvastatin (CRESTOR) 20 MG tablet Take  1 tablet (20 mg total) by mouth daily. 90 tablet 3  . triamcinolone (NASACORT) 55 MCG/ACT nasal inhaler Place 2 sprays into the nose daily. 3 Inhaler 3   No current facility-administered medications for this visit.     LABS/IMAGING: No results found for this or any previous visit (from the past 48 hour(s)). No results found.  WEIGHTS: Wt Readings from Last 3 Encounters:  10/05/16 198 lb 9.6 oz (90.1 kg)  08/29/16 199 lb 8.3 oz (90.5 kg)  08/21/16 198 lb (89.8 kg)    VITALS: BP 118/80   Pulse 66   Ht 5\' 5"  (1.651 m)   Wt 198 lb 9.6 oz (90.1 kg)   LMP  (LMP Unknown)   BMI 33.05 kg/m   EXAM: General appearance: alert and no distress Neck: no carotid bruit and no JVD Lungs: clear to auscultation bilaterally Heart: regular rate and rhythm, S1, S2 normal, no murmur, click, rub or gallop Abdomen: soft, non-tender; bowel sounds normal; no masses,  no organomegaly Extremities: extremities normal, atraumatic, no cyanosis or edema Pulses: 2+ and symmetric Skin: Skin color, texture, turgor normal. No rashes or lesions Neurologic: Grossly normal Psych: Pleasant  EKG: Normal sinus rhythm at 66  ASSESSMENT: 1. Coronary artery disease status post PCI with a drug-eluting stent to the mid/distal RCA (02/2016) 2. AVNRT - s/p catheter ablation in 04/2016 3. S/p NSTEMI with abnormal nuclear stress  test 4. Dyslipidemia 5. Hypertension  PLAN: 1.   Natalie Hall is doing well after her RCA stent. This was for non-ST elevation MI. She also had catheter ablation for AVNRT - this was being masked with diltiazem, but resurfaced after changing diltiazem to metoprolol for CAD. It was deemed successful. Cholesterol recently checked and not at goal LDL <70. I have increased her Crestor to 20 mg daily. Where awaiting the results of her recent lipid profile. She will be on Brillinta likely until 05/2017 for trial. Follow-up in August and we will discuss stopping Brillinta at that time. She will plan to have shoulder surgery with Dr. [name] shortly after that.  Pixie Casino, MD, [facility] Attending Cardiologist [place] C Breena Bev[place]qua 10/05/2016, 10:57 AM

## 2016-10-10 ENCOUNTER — Telehealth: Payer: Self-pay | Admitting: *Deleted

## 2016-10-10 MED ORDER — ROSUVASTATIN CALCIUM 40 MG PO TABS: 40.0000 mg | ORAL_TABLET | Freq: Every day | ORAL | 3 refills | Status: DC

## 2016-10-10 NOTE — Telephone Encounter (Signed)
Patient informed and verbalized understanding of plan. 

## 2016-10-10 NOTE — Telephone Encounter (Signed)
-----   Message from [name], MD sent at 10/09/2016 10:10 AM EST ----- Should increase crestor to 40 mg

## 2016-11-16 ENCOUNTER — Other Ambulatory Visit: Payer: Self-pay | Admitting: Internal Medicine

## 2016-11-16 ENCOUNTER — Other Ambulatory Visit: Payer: Self-pay | Admitting: Family Medicine

## 2016-11-16 NOTE — Telephone Encounter (Signed)
Rx(s) sent to pharmacy electronically.  

## 2016-12-11 ENCOUNTER — Telehealth: Payer: Self-pay | Admitting: Internal Medicine

## 2016-12-11 NOTE — Telephone Encounter (Signed)
Faxed signed orders for cardiac rehab maintenance program

## 2016-12-21 ENCOUNTER — Encounter: Payer: Self-pay | Admitting: Family Medicine

## 2016-12-21 ENCOUNTER — Encounter (HOSPITAL_COMMUNITY)
Admission: RE | Admit: 2016-12-21 | Discharge: 2016-12-21 | Disposition: A | Discharge status: Home / Self Care | Payer: Self-pay | Source: Ambulatory Visit | Attending: Internal Medicine | Admitting: Internal Medicine

## 2016-12-21 DIAGNOSIS — Z955 Presence of coronary angioplasty implant and graft: Secondary | ICD-10-CM | POA: Insufficient documentation

## 2016-12-21 DIAGNOSIS — Z9889 Other specified postprocedural states: Secondary | ICD-10-CM | POA: Insufficient documentation

## 2016-12-21 DIAGNOSIS — Z5189 Encounter for other specified aftercare: Secondary | ICD-10-CM | POA: Insufficient documentation

## 2016-12-21 LAB — HM DEXA SCAN

## 2016-12-24 ENCOUNTER — Encounter (HOSPITAL_COMMUNITY)
Admission: RE | Admit: 2016-12-24 | Discharge: 2016-12-24 | Disposition: A | Discharge status: Home / Self Care | Payer: Self-pay | Source: Ambulatory Visit | Attending: Internal Medicine | Admitting: Internal Medicine

## 2016-12-26 ENCOUNTER — Encounter (HOSPITAL_COMMUNITY)
Admission: RE | Admit: 2016-12-26 | Discharge: 2016-12-26 | Disposition: A | Discharge status: Home / Self Care | Payer: Self-pay | Source: Ambulatory Visit | Attending: Internal Medicine | Admitting: Internal Medicine

## 2016-12-26 ENCOUNTER — Encounter: Payer: Self-pay | Admitting: *Deleted

## 2016-12-26 DIAGNOSIS — Z006 Encounter for examination for normal comparison and control in clinical research program: Secondary | ICD-10-CM

## 2016-12-26 NOTE — Progress Notes (Signed)
TWILIGHT Research study month 9 follow up visit completed. Patient denies any bleeding or other adverse events. 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 She  underwent a Radioactive Plaque application under General Anesthesia for a Choroidal Melanoma of the left eye on 09/17/2016 5:53 AM. The patient was then admitted to [facility] and had minimal ocular complaints during their stay. The patient was seen in daily follow-up by the Retina Service under Dr. Gerarda Fraction, and underwent Plaque removal in the Ophthalmology Clinic on 12.1.17. This was completed without complication and the patient was discharged from the hospital following the procedure. She had a cessation of study medication (ASA/Placebo) 09/12/16-09/22/16. She did NOT have a cessation of the Brilinta and was provided (Brilinta) from the hospital during her admission. After pill count she has been 95 % compliant with study drug and 98 % compliant with Brilinta. Next research required visit is due no later than 16/SEP/2018. At this visit she will no longer receive study medication or Brilinta further antiplatelet therapy will be at the discretion of her cardiologist. Questions encouraged and answered.

## 2016-12-28 ENCOUNTER — Encounter (HOSPITAL_COMMUNITY)
Admission: RE | Admit: 2016-12-28 | Discharge: 2016-12-28 | Disposition: A | Discharge status: Home / Self Care | Payer: Self-pay | Source: Ambulatory Visit | Attending: Internal Medicine | Admitting: Internal Medicine

## 2016-12-28 ENCOUNTER — Encounter: Payer: Self-pay | Admitting: Family Medicine

## 2016-12-28 DIAGNOSIS — M8580 Other specified disorders of bone density and structure, unspecified site: Secondary | ICD-10-CM | POA: Insufficient documentation

## 2016-12-31 ENCOUNTER — Encounter (HOSPITAL_COMMUNITY): Payer: Self-pay

## 2017-01-02 ENCOUNTER — Encounter (HOSPITAL_COMMUNITY)
Admission: RE | Admit: 2017-01-02 | Discharge: 2017-01-02 | Disposition: A | Discharge status: Home / Self Care | Payer: Self-pay | Source: Ambulatory Visit | Attending: Internal Medicine | Admitting: Internal Medicine

## 2017-01-04 ENCOUNTER — Encounter (HOSPITAL_COMMUNITY)
Admission: RE | Admit: 2017-01-04 | Discharge: 2017-01-04 | Disposition: A | Discharge status: Home / Self Care | Payer: Self-pay | Source: Ambulatory Visit | Attending: Internal Medicine | Admitting: Internal Medicine

## 2017-01-07 ENCOUNTER — Encounter (HOSPITAL_COMMUNITY)
Admission: RE | Admit: 2017-01-07 | Discharge: 2017-01-07 | Disposition: A | Discharge status: Home / Self Care | Payer: Self-pay | Source: Ambulatory Visit | Attending: Internal Medicine | Admitting: Internal Medicine

## 2017-01-09 ENCOUNTER — Encounter (HOSPITAL_COMMUNITY): Payer: Self-pay

## 2017-01-11 ENCOUNTER — Encounter (HOSPITAL_COMMUNITY)
Admission: RE | Admit: 2017-01-11 | Discharge: 2017-01-11 | Disposition: A | Discharge status: Home / Self Care | Payer: Self-pay | Source: Ambulatory Visit | Attending: Internal Medicine | Admitting: Internal Medicine

## 2017-01-14 ENCOUNTER — Encounter (HOSPITAL_COMMUNITY)
Admission: RE | Admit: 2017-01-14 | Discharge: 2017-01-14 | Disposition: A | Discharge status: Home / Self Care | Payer: Self-pay | Source: Ambulatory Visit | Attending: Internal Medicine | Admitting: Internal Medicine

## 2017-01-16 ENCOUNTER — Encounter (HOSPITAL_COMMUNITY)
Admission: RE | Admit: 2017-01-16 | Discharge: 2017-01-16 | Disposition: A | Discharge status: Home / Self Care | Payer: Self-pay | Source: Ambulatory Visit | Attending: Internal Medicine | Admitting: Internal Medicine

## 2017-01-18 ENCOUNTER — Encounter (HOSPITAL_COMMUNITY)
Admission: RE | Admit: 2017-01-18 | Discharge: 2017-01-18 | Disposition: A | Discharge status: Home / Self Care | Payer: Self-pay | Source: Ambulatory Visit | Attending: Internal Medicine | Admitting: Internal Medicine

## 2017-01-21 ENCOUNTER — Encounter (HOSPITAL_COMMUNITY)
Admission: RE | Admit: 2017-01-21 | Discharge: 2017-01-21 | Disposition: A | Discharge status: Home / Self Care | Payer: Self-pay | Source: Ambulatory Visit | Attending: Internal Medicine | Admitting: Internal Medicine

## 2017-01-21 DIAGNOSIS — Z9889 Other specified postprocedural states: Secondary | ICD-10-CM | POA: Insufficient documentation

## 2017-01-21 DIAGNOSIS — Z955 Presence of coronary angioplasty implant and graft: Secondary | ICD-10-CM | POA: Insufficient documentation

## 2017-01-21 DIAGNOSIS — Z5189 Encounter for other specified aftercare: Secondary | ICD-10-CM | POA: Insufficient documentation

## 2017-01-23 ENCOUNTER — Encounter (HOSPITAL_COMMUNITY)
Admission: RE | Admit: 2017-01-23 | Discharge: 2017-01-23 | Disposition: A | Discharge status: Home / Self Care | Payer: Self-pay | Source: Ambulatory Visit | Attending: Internal Medicine | Admitting: Internal Medicine

## 2017-01-25 ENCOUNTER — Encounter (HOSPITAL_COMMUNITY)
Admission: RE | Admit: 2017-01-25 | Discharge: 2017-01-25 | Disposition: A | Discharge status: Home / Self Care | Payer: Self-pay | Source: Ambulatory Visit | Attending: Internal Medicine | Admitting: Internal Medicine

## 2017-01-28 ENCOUNTER — Encounter (HOSPITAL_COMMUNITY)
Admission: RE | Admit: 2017-01-28 | Discharge: 2017-01-28 | Disposition: A | Discharge status: Home / Self Care | Payer: Self-pay | Source: Ambulatory Visit | Attending: Internal Medicine | Admitting: Internal Medicine

## 2017-01-29 ENCOUNTER — Ambulatory Visit (INDEPENDENT_AMBULATORY_CARE_PROVIDER_SITE_OTHER): Payer: Medicare Other | Admitting: Family Medicine

## 2017-01-29 ENCOUNTER — Encounter: Payer: Self-pay | Admitting: Family Medicine

## 2017-01-29 VITALS — BP 129/86 | HR 70 | Temp 98.5°F | Ht 65.0 in | Wt 197.0 lb

## 2017-01-29 DIAGNOSIS — R21 Rash and other nonspecific skin eruption: Secondary | ICD-10-CM | POA: Diagnosis not present

## 2017-01-29 MED ORDER — PREDNISONE 10 MG PO TABS: ORAL_TABLET | ORAL | 0 refills | Status: DC

## 2017-01-29 NOTE — Progress Notes (Signed)
   Subjective:    Patient ID: Natalie Hall, female    DOB: 09/27/1951, 66 y.o.   MRN: [id number]  HPI Here for 4 days of an itchy rash on both legs. She has had this a few times before, the last time was about 2 years ago. She always responds well to prednisone.    Review of Systems  Constitutional: Negative.   Respiratory: Negative.   Cardiovascular: Negative.   Skin: Positive for rash.       Objective:   Physical Exam  Constitutional: She appears well-developed and well-nourished.  Cardiovascular: Normal rate, regular rhythm, normal heart sounds and intact distal pulses.   Pulmonary/Chest: Effort normal and breath sounds normal.  Skin:  Both lower legs have scattered red macular spots          Assessment & Plan:  Eczema, treat with a Prednisone taper.  Alysia Penna, MD

## 2017-01-29 NOTE — Progress Notes (Signed)
Pre visit review using our clinic review tool, if applicable. No additional management support is needed unless otherwise documented below in the visit note. 

## 2017-01-29 NOTE — Patient Instructions (Signed)
WE NOW OFFER   [facility] FAST TRACK!!!  SAME DAY Appointments for ACUTE CARE  Such as: Sprains, Injuries, cuts, abrasions, rashes, muscle pain, joint pain, back pain Colds, flu, sore throats, headache, allergies, cough, fever  Ear pain, sinus and eye infections Abdominal pain, nausea, vomiting, diarrhea, upset stomach Animal/insect bites  3 Easy Ways to Schedule: Walk-In Scheduling Call in scheduling Mychart Sign-up: [email or web address]         

## 2017-01-30 ENCOUNTER — Encounter (HOSPITAL_COMMUNITY): Payer: Self-pay

## 2017-01-30 ENCOUNTER — Other Ambulatory Visit: Payer: Self-pay | Admitting: Physician Assistant

## 2017-02-01 ENCOUNTER — Encounter (HOSPITAL_COMMUNITY)
Admission: RE | Admit: 2017-02-01 | Discharge: 2017-02-01 | Disposition: A | Discharge status: Home / Self Care | Payer: Self-pay | Source: Ambulatory Visit | Attending: Internal Medicine | Admitting: Internal Medicine

## 2017-02-04 ENCOUNTER — Encounter (HOSPITAL_COMMUNITY)
Admission: RE | Admit: 2017-02-04 | Discharge: 2017-02-04 | Disposition: A | Discharge status: Home / Self Care | Payer: Self-pay | Source: Ambulatory Visit | Attending: Internal Medicine | Admitting: Internal Medicine

## 2017-02-06 ENCOUNTER — Encounter (HOSPITAL_COMMUNITY)
Admission: RE | Admit: 2017-02-06 | Discharge: 2017-02-06 | Disposition: A | Discharge status: Home / Self Care | Payer: Self-pay | Source: Ambulatory Visit | Attending: Internal Medicine | Admitting: Internal Medicine

## 2017-02-08 ENCOUNTER — Encounter (HOSPITAL_COMMUNITY)
Admission: RE | Admit: 2017-02-08 | Discharge: 2017-02-08 | Disposition: A | Discharge status: Home / Self Care | Payer: Self-pay | Source: Ambulatory Visit | Attending: Internal Medicine | Admitting: Internal Medicine

## 2017-02-11 ENCOUNTER — Encounter (HOSPITAL_COMMUNITY)
Admission: RE | Admit: 2017-02-11 | Discharge: 2017-02-11 | Disposition: A | Discharge status: Home / Self Care | Payer: Self-pay | Source: Ambulatory Visit | Attending: Internal Medicine | Admitting: Internal Medicine

## 2017-02-11 ENCOUNTER — Other Ambulatory Visit (INDEPENDENT_AMBULATORY_CARE_PROVIDER_SITE_OTHER): Payer: Medicare Other

## 2017-02-11 DIAGNOSIS — I10 Essential (primary) hypertension: Secondary | ICD-10-CM

## 2017-02-11 DIAGNOSIS — E785 Hyperlipidemia, unspecified: Secondary | ICD-10-CM | POA: Diagnosis not present

## 2017-02-11 DIAGNOSIS — Z0000 Encounter for general adult medical examination without abnormal findings: Secondary | ICD-10-CM

## 2017-02-11 LAB — HEPATIC FUNCTION PANEL
ALK PHOS: 40 U/L (ref 39–117)
ALT: 17 U/L (ref 0–35)
AST: 16 U/L (ref 0–37)
Albumin: 3.7 g/dL (ref 3.5–5.2)
BILIRUBIN DIRECT: 0.1 mg/dL (ref 0.0–0.3)
TOTAL PROTEIN: 6.4 g/dL (ref 6.0–8.3)
Total Bilirubin: 0.6 mg/dL (ref 0.2–1.2)

## 2017-02-11 LAB — POC URINALSYSI DIPSTICK (AUTOMATED)
Bilirubin, UA: NEGATIVE
GLUCOSE UA: NEGATIVE
Ketones, UA: NEGATIVE
Leukocytes, UA: NEGATIVE
Nitrite, UA: NEGATIVE
Protein, UA: NEGATIVE
SPEC GRAV UA: 1.01 (ref 1.010–1.025)
UROBILINOGEN UA: 0.2 U/dL
pH, UA: 6 (ref 5.0–8.0)

## 2017-02-11 LAB — BASIC METABOLIC PANEL
BUN: 15 mg/dL (ref 6–23)
CALCIUM: 9 mg/dL (ref 8.4–10.5)
CO2: 29 meq/L (ref 19–32)
CREATININE: 0.69 mg/dL (ref 0.40–1.20)
Chloride: 101 mEq/L (ref 96–112)
GFR: 90.26 mL/min (ref >60.00)
Glucose, Bld: 92 mg/dL (ref 70–99)
Potassium: 3.9 mEq/L (ref 3.5–5.1)
Sodium: 135 mEq/L (ref 135–145)

## 2017-02-11 LAB — LIPID PANEL
CHOL/HDL RATIO: 4
CHOLESTEROL: 139 mg/dL (ref 0–200)
HDL: 34 mg/dL — AB (ref >39.00)
LDL Cholesterol: 69 mg/dL (ref 0–99)
NonHDL: 104.51
TRIGLYCERIDES: 177 mg/dL — AB (ref 0.0–149.0)
VLDL: 35.4 mg/dL (ref 0.0–40.0)

## 2017-02-11 LAB — CBC WITH DIFFERENTIAL/PLATELET
BASOS ABS: 0 10*3/uL (ref 0.0–0.1)
Basophils Relative: 0.5 % (ref 0.0–3.0)
EOS ABS: 0.1 10*3/uL (ref 0.0–0.7)
Eosinophils Relative: 2.7 % (ref 0.0–5.0)
HEMATOCRIT: 39.4 % (ref 36.0–46.0)
Hemoglobin: 13.1 g/dL (ref 12.0–15.0)
LYMPHS PCT: 25.9 % (ref 12.0–46.0)
Lymphs Abs: 1.3 10*3/uL (ref 0.7–4.0)
MCHC: 33.3 g/dL (ref 30.0–36.0)
MCV: 86.1 fl (ref 78.0–100.0)
MONO ABS: 0.4 10*3/uL (ref 0.1–1.0)
Monocytes Relative: 7.5 % (ref 3.0–12.0)
NEUTROS ABS: 3.1 10*3/uL (ref 1.4–7.7)
Neutrophils Relative %: 63.4 % (ref 43.0–77.0)
PLATELETS: 168 10*3/uL (ref 150.0–400.0)
RBC: 4.58 Mil/uL (ref 3.87–5.11)
RDW: 15.5 % (ref 11.5–15.5)
WBC: 4.9 10*3/uL (ref 4.0–10.5)

## 2017-02-11 LAB — TSH: TSH: 1.64 u[IU]/mL (ref 0.35–4.50)

## 2017-02-13 ENCOUNTER — Encounter (HOSPITAL_COMMUNITY): Payer: Self-pay

## 2017-02-15 ENCOUNTER — Encounter (HOSPITAL_COMMUNITY): Payer: Self-pay

## 2017-02-18 ENCOUNTER — Encounter (HOSPITAL_COMMUNITY): Payer: Self-pay

## 2017-02-19 ENCOUNTER — Ambulatory Visit (INDEPENDENT_AMBULATORY_CARE_PROVIDER_SITE_OTHER): Payer: Medicare Other | Admitting: Family Medicine

## 2017-02-19 ENCOUNTER — Encounter: Payer: Self-pay | Admitting: Family Medicine

## 2017-02-19 VITALS — BP 124/72 | Temp 98.5°F | Ht 65.0 in | Wt 197.0 lb

## 2017-02-19 DIAGNOSIS — Z0000 Encounter for general adult medical examination without abnormal findings: Secondary | ICD-10-CM | POA: Diagnosis not present

## 2017-02-19 DIAGNOSIS — R918 Other nonspecific abnormal finding of lung field: Secondary | ICD-10-CM

## 2017-02-19 NOTE — Progress Notes (Signed)
   Subjective:    Patient ID: Natalie Hall, female    DOB: Jun 14, 1951, 66 y.o.   MRN: [id number]  HPI 66 yr old female to follow up. She feels fine. She sees her Opthalmologist regularly for the choroidal melanoma. After radiation treatments she says the tumor has shrunk by about 10%. She sees Cardiology regularly and is still in the research protocol. She had a chest CT on 08-24-16 which showed two 3 mm nodules in the right lung. We had planned on getting a 6 month follow up scan.    Review of Systems  Constitutional: Negative.   HENT: Negative.   Eyes: Negative.   Respiratory: Negative.   Cardiovascular: Negative.   Gastrointestinal: Negative.   Genitourinary: Negative for decreased urine volume, difficulty urinating, dyspareunia, dysuria, enuresis, flank pain, frequency, hematuria, pelvic pain and urgency.  Musculoskeletal: Negative.   Skin: Negative.   Neurological: Negative.   Psychiatric/Behavioral: Negative.        Objective:   Physical Exam  Constitutional: She is oriented to person, place, and time. She appears well-developed and well-nourished. No distress.  HENT:  Head: Normocephalic and atraumatic.  Right Ear: External ear normal.  Left Ear: External ear normal.  Nose: Nose normal.  Mouth/Throat: Oropharynx is clear and moist. No oropharyngeal exudate.  Eyes: Conjunctivae and EOM are normal. Pupils are equal, round, and reactive to light. No scleral icterus.  Neck: Normal range of motion. Neck supple. No JVD present. No thyromegaly present.  Cardiovascular: Normal rate, regular rhythm, normal heart sounds and intact distal pulses.  Exam reveals no gallop and no friction rub.   No murmur heard. Pulmonary/Chest: Effort normal and breath sounds normal. No respiratory distress. She has no wheezes. She has no rales. She exhibits no tenderness.  Abdominal: Soft. Bowel sounds are normal. She exhibits no distension and no mass. There is no tenderness. There is no rebound and  no guarding.  Musculoskeletal: Normal range of motion. She exhibits no edema or tenderness.  Lymphadenopathy:    She has no cervical adenopathy.  Neurological: She is alert and oriented to person, place, and time. She has normal reflexes. No cranial nerve deficit. She exhibits normal muscle tone. Coordination normal.  Skin: Skin is warm and dry. No rash noted. No erythema.  Psychiatric: She has a normal mood and affect. Her behavior is normal. Judgment and thought content normal.          Assessment & Plan:  Well exam. We discussed diet and exercise. We will set up another chest CT soon.  Alysia Penna, MD

## 2017-02-19 NOTE — Progress Notes (Signed)
Pre visit review using our clinic review tool, if applicable. No additional management support is needed unless otherwise documented below in the visit note. 

## 2017-02-19 NOTE — Patient Instructions (Signed)
WE NOW OFFER   [facility] FAST TRACK!!!  SAME DAY Appointments for ACUTE CARE  Such as: Sprains, Injuries, cuts, abrasions, rashes, muscle pain, joint pain, back pain Colds, flu, sore throats, headache, allergies, cough, fever  Ear pain, sinus and eye infections Abdominal pain, nausea, vomiting, diarrhea, upset stomach Animal/insect bites  3 Easy Ways to Schedule: Walk-In Scheduling Call in scheduling Mychart Sign-up: [email or web address]         

## 2017-02-20 ENCOUNTER — Encounter (HOSPITAL_COMMUNITY)
Admission: RE | Admit: 2017-02-20 | Discharge: 2017-02-20 | Disposition: A | Discharge status: Home / Self Care | Payer: Self-pay | Source: Ambulatory Visit | Attending: Internal Medicine | Admitting: Internal Medicine

## 2017-02-20 DIAGNOSIS — Z5189 Encounter for other specified aftercare: Secondary | ICD-10-CM | POA: Insufficient documentation

## 2017-02-20 DIAGNOSIS — Z955 Presence of coronary angioplasty implant and graft: Secondary | ICD-10-CM | POA: Insufficient documentation

## 2017-02-20 DIAGNOSIS — Z9889 Other specified postprocedural states: Secondary | ICD-10-CM | POA: Insufficient documentation

## 2017-02-22 ENCOUNTER — Encounter (HOSPITAL_COMMUNITY): Payer: Self-pay

## 2017-02-25 ENCOUNTER — Encounter: Payer: Self-pay | Admitting: Family Medicine

## 2017-02-25 ENCOUNTER — Encounter (HOSPITAL_COMMUNITY)
Admission: RE | Admit: 2017-02-25 | Discharge: 2017-02-25 | Disposition: A | Discharge status: Home / Self Care | Payer: Self-pay | Source: Ambulatory Visit | Attending: Internal Medicine | Admitting: Internal Medicine

## 2017-02-25 ENCOUNTER — Encounter: Payer: Self-pay | Admitting: Internal Medicine

## 2017-02-27 ENCOUNTER — Encounter (HOSPITAL_COMMUNITY)
Admission: RE | Admit: 2017-02-27 | Discharge: 2017-02-27 | Disposition: A | Discharge status: Home / Self Care | Payer: Medicare Other | Source: Ambulatory Visit | Attending: Internal Medicine | Admitting: Internal Medicine

## 2017-02-28 ENCOUNTER — Telehealth: Payer: Self-pay | Admitting: Family Medicine

## 2017-02-28 ENCOUNTER — Other Ambulatory Visit: Payer: Self-pay | Admitting: Internal Medicine

## 2017-02-28 NOTE — Telephone Encounter (Signed)
REFILL 

## 2017-02-28 NOTE — Telephone Encounter (Signed)
[facility] states order for CT scan needs to be without contrast. Can you change? Then they will call pt and schedule

## 2017-03-01 ENCOUNTER — Encounter (HOSPITAL_COMMUNITY)
Admission: RE | Admit: 2017-03-01 | Discharge: 2017-03-01 | Disposition: A | Discharge status: Home / Self Care | Payer: Self-pay | Source: Ambulatory Visit | Attending: Internal Medicine | Admitting: Internal Medicine

## 2017-03-01 NOTE — Telephone Encounter (Signed)
I am not sure what the problem is. My original order clearly states "without contrast"

## 2017-03-01 NOTE — Telephone Encounter (Signed)
I changed the CT order

## 2017-03-01 NOTE — Telephone Encounter (Signed)
I spoke with CT department, must change order to read CT chest without contrast.

## 2017-03-01 NOTE — Addendum Note (Signed)
Addended by: Alysia Penna A on: 03/01/2017 04:21 PM   Modules accepted: Orders

## 2017-03-04 ENCOUNTER — Encounter (HOSPITAL_COMMUNITY): Payer: Self-pay

## 2017-03-06 ENCOUNTER — Encounter (HOSPITAL_COMMUNITY): Payer: Self-pay

## 2017-03-08 ENCOUNTER — Encounter (HOSPITAL_COMMUNITY)
Admission: RE | Admit: 2017-03-08 | Discharge: 2017-03-08 | Disposition: A | Discharge status: Home / Self Care | Payer: Self-pay | Source: Ambulatory Visit | Attending: Internal Medicine | Admitting: Internal Medicine

## 2017-03-11 ENCOUNTER — Encounter (HOSPITAL_COMMUNITY)
Admission: RE | Admit: 2017-03-11 | Discharge: 2017-03-11 | Disposition: A | Discharge status: Home / Self Care | Payer: Self-pay | Source: Ambulatory Visit | Attending: Internal Medicine | Admitting: Internal Medicine

## 2017-03-12 ENCOUNTER — Ambulatory Visit (INDEPENDENT_AMBULATORY_CARE_PROVIDER_SITE_OTHER)
Admission: RE | Admit: 2017-03-12 | Discharge: 2017-03-12 | Disposition: A | Discharge status: Home / Self Care | Payer: Medicare Other | Source: Ambulatory Visit | Attending: Family Medicine | Admitting: Family Medicine

## 2017-03-12 DIAGNOSIS — R918 Other nonspecific abnormal finding of lung field: Secondary | ICD-10-CM | POA: Diagnosis not present

## 2017-03-13 ENCOUNTER — Encounter (HOSPITAL_COMMUNITY)
Admission: RE | Admit: 2017-03-13 | Discharge: 2017-03-13 | Disposition: A | Discharge status: Home / Self Care | Payer: Self-pay | Source: Ambulatory Visit | Attending: Internal Medicine | Admitting: Internal Medicine

## 2017-03-15 ENCOUNTER — Encounter (HOSPITAL_COMMUNITY): Payer: Self-pay

## 2017-03-20 ENCOUNTER — Encounter (HOSPITAL_COMMUNITY)
Admission: RE | Admit: 2017-03-20 | Discharge: 2017-03-20 | Disposition: A | Discharge status: Home / Self Care | Payer: Self-pay | Source: Ambulatory Visit | Attending: Internal Medicine | Admitting: Internal Medicine

## 2017-03-22 ENCOUNTER — Encounter (HOSPITAL_COMMUNITY)
Admission: RE | Admit: 2017-03-22 | Discharge: 2017-03-22 | Disposition: A | Discharge status: Home / Self Care | Payer: Self-pay | Source: Ambulatory Visit | Attending: Internal Medicine | Admitting: Internal Medicine

## 2017-03-22 DIAGNOSIS — Z9889 Other specified postprocedural states: Secondary | ICD-10-CM | POA: Insufficient documentation

## 2017-03-22 DIAGNOSIS — Z955 Presence of coronary angioplasty implant and graft: Secondary | ICD-10-CM | POA: Insufficient documentation

## 2017-03-22 DIAGNOSIS — Z5189 Encounter for other specified aftercare: Secondary | ICD-10-CM | POA: Insufficient documentation

## 2017-03-25 ENCOUNTER — Encounter (HOSPITAL_COMMUNITY)
Admission: RE | Admit: 2017-03-25 | Discharge: 2017-03-25 | Disposition: A | Discharge status: Home / Self Care | Payer: Self-pay | Source: Ambulatory Visit | Attending: Internal Medicine | Admitting: Internal Medicine

## 2017-03-27 ENCOUNTER — Encounter (HOSPITAL_COMMUNITY): Payer: Self-pay

## 2017-03-29 ENCOUNTER — Encounter (HOSPITAL_COMMUNITY)
Admission: RE | Admit: 2017-03-29 | Discharge: 2017-03-29 | Disposition: A | Discharge status: Home / Self Care | Payer: Self-pay | Source: Ambulatory Visit | Attending: Internal Medicine | Admitting: Internal Medicine

## 2017-04-01 ENCOUNTER — Encounter (HOSPITAL_COMMUNITY)
Admission: RE | Admit: 2017-04-01 | Discharge: 2017-04-01 | Disposition: A | Discharge status: Home / Self Care | Payer: Self-pay | Source: Ambulatory Visit | Attending: Internal Medicine | Admitting: Internal Medicine

## 2017-04-03 ENCOUNTER — Encounter (HOSPITAL_COMMUNITY)
Admission: RE | Admit: 2017-04-03 | Discharge: 2017-04-03 | Disposition: A | Discharge status: Home / Self Care | Payer: Self-pay | Source: Ambulatory Visit | Attending: Internal Medicine | Admitting: Internal Medicine

## 2017-04-05 ENCOUNTER — Encounter (HOSPITAL_COMMUNITY)
Admission: RE | Admit: 2017-04-05 | Discharge: 2017-04-05 | Disposition: A | Discharge status: Home / Self Care | Payer: Self-pay | Source: Ambulatory Visit | Attending: Internal Medicine | Admitting: Internal Medicine

## 2017-04-08 ENCOUNTER — Encounter (HOSPITAL_COMMUNITY)
Admission: RE | Admit: 2017-04-08 | Discharge: 2017-04-08 | Disposition: A | Discharge status: Home / Self Care | Payer: Medicare Other | Source: Ambulatory Visit | Attending: Internal Medicine | Admitting: Internal Medicine

## 2017-04-10 ENCOUNTER — Encounter (HOSPITAL_COMMUNITY)
Admission: RE | Admit: 2017-04-10 | Discharge: 2017-04-10 | Disposition: A | Discharge status: Home / Self Care | Payer: Self-pay | Source: Ambulatory Visit | Attending: Internal Medicine | Admitting: Internal Medicine

## 2017-04-12 ENCOUNTER — Encounter (HOSPITAL_COMMUNITY)
Admission: RE | Admit: 2017-04-12 | Discharge: 2017-04-12 | Disposition: A | Discharge status: Home / Self Care | Payer: Self-pay | Source: Ambulatory Visit | Attending: Internal Medicine | Admitting: Internal Medicine

## 2017-04-15 ENCOUNTER — Encounter (HOSPITAL_COMMUNITY)
Admission: RE | Admit: 2017-04-15 | Discharge: 2017-04-15 | Disposition: A | Discharge status: Home / Self Care | Payer: Self-pay | Source: Ambulatory Visit | Attending: Internal Medicine | Admitting: Internal Medicine

## 2017-04-17 ENCOUNTER — Encounter (HOSPITAL_COMMUNITY)
Admission: RE | Admit: 2017-04-17 | Discharge: 2017-04-17 | Disposition: A | Discharge status: Home / Self Care | Payer: Self-pay | Source: Ambulatory Visit | Attending: Internal Medicine | Admitting: Internal Medicine

## 2017-04-19 ENCOUNTER — Encounter (HOSPITAL_COMMUNITY): Payer: Self-pay

## 2017-04-22 ENCOUNTER — Encounter (HOSPITAL_COMMUNITY)
Admission: RE | Admit: 2017-04-22 | Discharge: 2017-04-22 | Disposition: A | Discharge status: Home / Self Care | Payer: Self-pay | Source: Ambulatory Visit | Attending: Internal Medicine | Admitting: Internal Medicine

## 2017-04-22 DIAGNOSIS — Z5189 Encounter for other specified aftercare: Secondary | ICD-10-CM | POA: Insufficient documentation

## 2017-04-22 DIAGNOSIS — Z9889 Other specified postprocedural states: Secondary | ICD-10-CM | POA: Insufficient documentation

## 2017-04-22 DIAGNOSIS — Z955 Presence of coronary angioplasty implant and graft: Secondary | ICD-10-CM | POA: Insufficient documentation

## 2017-04-24 ENCOUNTER — Encounter (HOSPITAL_COMMUNITY): Payer: Self-pay

## 2017-04-26 ENCOUNTER — Encounter (HOSPITAL_COMMUNITY)
Admission: RE | Admit: 2017-04-26 | Discharge: 2017-04-26 | Disposition: A | Discharge status: Home / Self Care | Payer: Self-pay | Source: Ambulatory Visit | Attending: Internal Medicine | Admitting: Internal Medicine

## 2017-04-29 ENCOUNTER — Encounter (HOSPITAL_COMMUNITY)
Admission: RE | Admit: 2017-04-29 | Discharge: 2017-04-29 | Disposition: A | Discharge status: Home / Self Care | Payer: Self-pay | Source: Ambulatory Visit | Attending: Internal Medicine | Admitting: Internal Medicine

## 2017-04-30 ENCOUNTER — Ambulatory Visit: Payer: Medicare Other

## 2017-05-01 ENCOUNTER — Encounter (HOSPITAL_COMMUNITY)
Admission: RE | Admit: 2017-05-01 | Discharge: 2017-05-01 | Disposition: A | Discharge status: Home / Self Care | Payer: Self-pay | Source: Ambulatory Visit | Attending: Internal Medicine | Admitting: Internal Medicine

## 2017-05-03 ENCOUNTER — Encounter: Payer: Self-pay | Admitting: Family Medicine

## 2017-05-03 ENCOUNTER — Encounter (HOSPITAL_COMMUNITY): Payer: Self-pay

## 2017-05-06 ENCOUNTER — Encounter (HOSPITAL_COMMUNITY)
Admission: RE | Admit: 2017-05-06 | Discharge: 2017-05-06 | Disposition: A | Discharge status: Home / Self Care | Payer: Self-pay | Source: Ambulatory Visit | Attending: Internal Medicine | Admitting: Internal Medicine

## 2017-05-06 ENCOUNTER — Other Ambulatory Visit: Payer: Self-pay | Admitting: Family Medicine

## 2017-05-06 DIAGNOSIS — N390 Urinary tract infection, site not specified: Secondary | ICD-10-CM

## 2017-05-06 NOTE — Telephone Encounter (Signed)
Please set her up for a lab appt to check a UA

## 2017-05-08 ENCOUNTER — Encounter (HOSPITAL_COMMUNITY): Payer: Self-pay

## 2017-05-10 ENCOUNTER — Encounter (HOSPITAL_COMMUNITY)
Admission: RE | Admit: 2017-05-10 | Discharge: 2017-05-10 | Disposition: A | Discharge status: Home / Self Care | Payer: Self-pay | Source: Ambulatory Visit | Attending: Internal Medicine | Admitting: Internal Medicine

## 2017-05-13 ENCOUNTER — Encounter (HOSPITAL_COMMUNITY)
Admission: RE | Admit: 2017-05-13 | Discharge: 2017-05-13 | Disposition: A | Discharge status: Home / Self Care | Payer: Self-pay | Source: Ambulatory Visit | Attending: Internal Medicine | Admitting: Internal Medicine

## 2017-05-13 NOTE — Progress Notes (Signed)
Subjective:   Natalie Hall is a 66 y.o. female who presents for an Initial Medicare Annual Wellness Visit.  Left reverse shoulder surgery 06/2017 pending cardiac clearance.   Review of Systems    No ROS.  Medicare Wellness Visit. Additional risk factors are reflected in the social history.  Cardiac Risk Factors include: advanced age (>83men, >102 women);dyslipidemia;hypertension;obesity (BMI >30kg/m2);family history of premature cardiovascular disease   Sleep patterns: Sleeps 8 hours, feels rested.  Home Safety/Smoke Alarms: Feels safe in home. Smoke alarms in place.  Living environment; residence and Firearm Safety: Lives with husband and son in 1 story home.  Seat Belt Safety/Bike Helmet: Wears seat belt.   Counseling:   Eye Exam-Last exam 01/2017. Melanoma left eye behind retina. Every 4 months, [facility] [place] exam 04/2017, every 6 months. Dr. Luretha Rued  Female:   Pap-N/A       Mammo-12/21/2016, BI RADS 2, benign.        Dexa scan-12/21/2016, Osteopenia.         CCS-Colonoscopy 06/23/2014, polyp. Recall 5 years.      Objective:    Today's Vitals   05/14/17 1607  BP: 112/70  Pulse: 73  SpO2: 98%  Weight: 201 lb 12.8 oz (91.5 kg)  Height: 5\' 5"  (1.651 m)  PainSc: 4    Body mass index is 33.58 kg/m.   Current Medications (verified) Outpatient Encounter Prescriptions as of 05/14/2017  Medication Sig  . AMBULATORY NON FORMULARY MEDICATION Take 90 mg by mouth 2 (two) times daily. Medication Name: BRILINTA 90 mg BID (TWILIGHT Research study PROVIDED)  . AMBULATORY NON FORMULARY MEDICATION Take 81 mg by mouth daily. Medication Name: Aspirin 81 mg Daily or PLACEBO (Randomized in the [facility])  . CALCIUM PO Take 1 tablet by mouth daily.   . Cholecalciferol (VITAMIN D-3 PO) Take 1 tablet by mouth daily.   Marland Kitchen levocetirizine (XYZAL) 5 MG tablet Take 5 mg by mouth every evening.  . metoprolol succinate (TOPROL-XL) 25 MG 24 hr tablet TAKE 25MG   TWICE A DAY  . Multiple Vitamins-Minerals (MULTIVITAMIN PO) Take 1 tablet by mouth daily.   . nitroGLYCERIN (NITROSTAT) 0.4 MG SL tablet DISSOLVE 1 TABLET UNDER THE TONGUE EVERY 5 MINUTES FOR  3 DOSES AS NEEDED FOR CHEST PAIN  . omeprazole (PRILOSEC) 40 MG capsule TAKE 1 CAPSULE BY MOUTH  DAILY  . ramipril (ALTACE) 10 MG capsule TAKE 1 CAPSULE BY MOUTH  DAILY  . rosuvastatin (CRESTOR) 40 MG tablet Take 1 tablet (40 mg total) by mouth daily.  Marland Kitchen triamcinolone (NASACORT) 55 MCG/ACT nasal inhaler Place 2 sprays into the nose daily.  Marland Kitchen loratadine (CLARITIN) 10 MG tablet Take 10 mg by mouth daily.  Marland Kitchen Zoster Vac Recomb Adjuvanted [facility]) injection Inject 0.5 mLs into the muscle once.  . [DISCONTINUED] predniSONE (DELTASONE) 10 MG tablet Take 4 tabs a day for 4 days, then 3 a day for 4 days, then 2 a day for 4 days, then 1 a day for 4 days, then stop (Patient not taking: Reported on 02/19/2017)   No facility-administered encounter medications on file as of 05/14/2017.     Allergies (verified) Levaquin [levofloxacin in d5w]; Augmentin [amoxicillin-pot clavulanate]; Codeine; and Lexapro [escitalopram oxalate]   History: Past Medical History:  Diagnosis Date  . Asthma    infrequent problem  . Choroidal nevus of left eye    se[facility]. Gerarda Fraction [facility].   . GERD (gastroesophageal reflux disease)   . Hx of  colonic polyp   . Hyperlipidemia   . Hypertension   . Melanoma of eye, left ([place])    sees Dr. Cordelia Pen  . Osteoarthritis   . Supraventricular tachycardia [facility])    Past Surgical History:  Procedure Laterality Date  . ABDOMINAL HYSTERECTOMY    . ACHILLES TENDON REPAIR  10-07-11   torn on right, per Dr. Noemi Chapel   . ANKLE SURGERY     torn tendon left ankle  . BREAST BIOPSY    . CARDIAC CATHETERIZATION N/A 03/14/2016   Procedure: Left Heart Cath and Coronary Angiography;  Surgeon: Peter M Martinique, MD;  Location: [place] [facility];  Service: Cardiovascular;  Laterality: N/A;  .  CARDIAC CATHETERIZATION N/A 03/14/2016   Procedure: Coronary Stent Intervention;  Surgeon: Peter M Martinique, MD;  Location: [place] [facility];  Service: Cardiovascular;  Laterality: N/A;  . CARPAL TUNNEL RELEASE    . CHOLECYSTECTOMY    . COLONOSCOPY  06-23-14   per Dr. Ardis Hughs, adenomatous polyps, repeat in 5 yrs    . ELECTROPHYSIOLOGIC STUDY N/A 05/21/2016   Procedure: SVT Ablation;  Surgeon: [name], MD;  Location: [place] [facility];  Service: Cardiovascular;  Laterality: N/A;  . KNEE CLOSED REDUCTION Left 02/03/2014   Procedure: CLOSED MANIPULATION LEFT KNEE;  Surgeon: Gearlean Alf, MD;  Location: [facility];  Service: Orthopedics;  Laterality: Left;  . POLYPECTOMY    . TOTAL KNEE ARTHROPLASTY Left 12/21/2013   Procedure: LEFT TOTAL KNEE ARTHROPLASTY;  Surgeon: Gearlean Alf, MD;  Location: [facility];  Service: Orthopedics;  Laterality: Left;  Marland Kitchen VARICOSE VEIN SURGERY     Family History  Problem Relation Age of Onset  . Colon polyps Mother   . Colon cancer Maternal Aunt   . Liver cancer Maternal Aunt   . Breast cancer Unknown   . Coronary artery disease Unknown   . Colon cancer Unknown   . Diabetes Unknown   . Hypertension Unknown   . Kidney disease Unknown   . Lung cancer Unknown   . Lung cancer Brother        spleen, bone  . Esophageal cancer Neg Hx   . Stomach cancer Neg Hx   . Rectal cancer Neg Hx    Social History   Occupational History  . Not on file.   Social History Main Topics  . Smoking status: Never Smoker  . Smokeless tobacco: Never Used  . Alcohol use No  . Drug use: No  . Sexual activity: Not on file    Tobacco Counseling Counseling given: No   Activities of Daily Living In your present state of health, do you have any difficulty performing the following activities: 05/14/2017  Hearing? N  Vision? N  Difficulty concentrating or making decisions? N  Walking or climbing stairs? N  Dressing or bathing? N  Doing errands, shopping? N  Preparing  Food and eating ? N  Using the Toilet? N  In the past six months, have you accidently leaked urine? N  Do you have problems with loss of bowel control? N  Managing your Medications? N  Managing your Finances? N  Housekeeping or managing your Housekeeping? N  Some recent data might be hidden    Immunizations and Health Maintenance Immunization History  Administered Date(s) Administered  . Influenza Split 08/06/2011, 08/18/2012  . Influenza Whole 08/05/2009, 07/19/2010  . Influenza, High Dose Seasonal PF 08/15/2015, 08/01/2016  . Influenza,inj,Quad PF,36+ Mos 07/24/2013  . Pneumococcal Conjugate-13 04/06/2015  . Pneumococcal Polysaccharide-23  10/22/2004  . Tdap 04/06/2015  . Zoster 04/12/2010   Health Maintenance Due  Topic Date Due  . PNA vac Low Risk Adult (2 of 2 - PPSV23) 04/05/2016    Patient Care Team: Laurey Morale, MD as PCP - Farrel Conners, MD as Consulting Physician (Orthopedic Surgery) [place], [place] (Specialist) Debara Pickett Nadean Corwin, MD as Consulting Physician (Cardiology) [place], Jennefer Bravo, MD as Referring Physician (Dermatology)  Indicate any recent Medical Services you may have received from other than [facility] providers in the past year (date may be approximate).     Assessment:   This is a routine wellness examination for [place]. Physical assessment deferred to PCP.   Hearing/Vision screen Hearing Screening Comments: Able to hear conversational tones w/o difficulty. No issues reported.   Vision Screening Comments: Wears reading glasses.   Dietary issues and exercise activities discussed: Current Exercise Habits: Structured exercise class, Time (Minutes): > 60, Frequency (Times/Week): 3, Weekly Exercise (Minutes/Week): 0, Exercise limited by: cardiac condition(s)   Diet (meal preparation, eat out, water intake, caffeinated beverages, dairy products, fruits and vegetables): Drinks water.  Breakfast: cereal, fruit; nutri grain  waffles; egg/toast Lunch: salad Dinner: protein, vegetables.      Discussed heart healthy diet and increasing activity as tolerated.   Goals      Patient Stated   . patient (pt-stated)          Remain as healthy as possible.       Depression Screen PHQ 2/9 Scores 05/14/2017 08/28/2016 05/28/2016 07/13/2015  PHQ - 2 Score 0 0 0 0    Fall Risk Fall Risk  05/14/2017 05/24/2016 07/13/2015 07/13/2015  Falls in the past year? No No No No    Cognitive Function:       Ad8 score reviewed for issues:  Issues making decisions: no  Less interest in hobbies / activities: no  Repeats questions, stories (family complaining): no  Trouble using ordinary gadgets (microwave, computer, phone): no  Forgets the month or year: no  Mismanaging finances:  no  Remembering appts: no  Daily problems with thinking and/or memory: no Ad8 score is=0     Screening Tests Health Maintenance  Topic Date Due  . PNA vac Low Risk Adult (2 of 2 - PPSV23) 04/05/2016  . INFLUENZA VACCINE  05/22/2017  . MAMMOGRAM  12/22/2018  . COLONOSCOPY  06/24/2019  . TETANUS/TDAP  04/05/2025  . DEXA SCAN  Completed  . Hepatitis C Screening  Completed   Shingrix Vaccine prescription sent to pharmacy.    Plan:    Bring a copy of your advance directives to your next office visit.  Continue doing brain stimulating activities (puzzles, reading, adult coloring books, staying active) to keep memory sharp.   Shingrix Vaccine at pharmacy  I have personally reviewed and noted the following in the patient's chart:   . Medical and social history . Use of alcohol, tobacco or illicit drugs  . Current medications and supplements . Functional ability and status . Nutritional status . Physical activity . Advanced directives . List of other physicians . Hospitalizations, surgeries, and ER visits in previous 12 months . Vitals . Screenings to include cognitive, depression, and falls . Referrals and  appointments  In addition, I have reviewed and discussed with patient certain preventive protocols, quality metrics, and best practice recommendations. A written personalized care plan for preventive services as well as general preventive health recommendations were provided to patient.     Gerilyn Nestle, RN   05/14/2017  I have reviewed this note and agree with its contents.  Alysia Penna, MD

## 2017-05-14 ENCOUNTER — Ambulatory Visit (INDEPENDENT_AMBULATORY_CARE_PROVIDER_SITE_OTHER): Payer: Medicare Other

## 2017-05-14 DIAGNOSIS — Z23 Encounter for immunization: Secondary | ICD-10-CM | POA: Diagnosis not present

## 2017-05-14 DIAGNOSIS — Z0000 Encounter for general adult medical examination without abnormal findings: Secondary | ICD-10-CM

## 2017-05-14 MED ORDER — ZOSTER VAC RECOMB ADJUVANTED 50 MCG/0.5ML IM SUSR: 0.5000 mL | Freq: Once | INTRAMUSCULAR | 1 refills | Status: AC

## 2017-05-14 NOTE — Patient Instructions (Addendum)
Bring a copy of your advance directives to your next office visit.  Continue doing brain stimulating activities (puzzles, reading, adult coloring books, staying active) to keep memory sharp.   Shingrix Vaccine at pharmacy   Health Maintenance, Female Adopting a healthy lifestyle and getting preventive care can go a long way to promote health and wellness. Talk with your health care provider about what schedule of regular examinations is right for you. This is a good chance for you to check in with your provider about disease prevention and staying healthy. In between checkups, there are plenty of things you can do on your own. Experts have done a lot of research about which lifestyle changes and preventive measures are most likely to keep you healthy. Ask your health care provider for more information. Weight and diet Eat a healthy diet  Be sure to include plenty of vegetables, fruits, low-fat dairy products, and lean protein.  Do not eat a lot of foods high in solid fats, added sugars, or salt.  Get regular exercise. This is one of the most important things you can do for your health. ? Most adults should exercise for at least 150 minutes each week. The exercise should increase your heart rate and make you sweat (moderate-intensity exercise). ? Most adults should also do strengthening exercises at least twice a week. This is in addition to the moderate-intensity exercise.  Maintain a healthy weight  Body mass index (BMI) is a measurement that can be used to identify possible weight problems. It estimates body fat based on height and weight. Your health care provider can help determine your BMI and help you achieve or maintain a healthy weight.  For females 20 years of age and older: ? A BMI below 18.5 is considered underweight. ? A BMI of 18.5 to 24.9 is normal. ? A BMI of 25 to 29.9 is considered overweight. ? A BMI of 30 and above is considered obese.  Watch levels of cholesterol  and blood lipids  You should start having your blood tested for lipids and cholesterol at 67 years of age, then have this test every 5 years.  You may need to have your cholesterol levels checked more often if: ? Your lipid or cholesterol levels are high. ? You are older than 67 years of age. ? You are at high risk for heart disease.  Cancer screening Lung Cancer  Lung cancer screening is recommended for adults 55-80 years old who are at high risk for lung cancer because of a history of smoking.  A yearly low-dose CT scan of the lungs is recommended for people who: ? Currently smoke. ? Have quit within the past 15 years. ? Have at least a 30-pack-year history of smoking. A pack year is smoking an average of one pack of cigarettes a day for 1 year.  Yearly screening should continue until it has been 15 years since you quit.  Yearly screening should stop if you develop a health problem that would prevent you from having lung cancer treatment.  Breast Cancer  Practice breast self-awareness. This means understanding how your breasts normally appear and feel.  It also means doing regular breast self-exams. Let your health care provider know about any changes, no matter how small.  If you are in your 20s or 30s, you should have a clinical breast exam (CBE) by a health care provider every 1-3 years as part of a regular health exam.  If you are 40 or older, have a CBE   year. Also consider having a breast X-ray (mammogram) every year.  If you have a family history of breast cancer, talk to your health care provider about genetic screening.  If you are at high risk for breast cancer, talk to your health care provider about having an MRI and a mammogram every year.  Breast cancer gene (BRCA) assessment is recommended for women who have family members with BRCA-related cancers. BRCA-related cancers include: ? Breast. ? Ovarian. ? Tubal. ? Peritoneal cancers.  Results of the assessment  will determine the need for genetic counseling and BRCA1 and BRCA2 testing.  Cervical Cancer Your health care provider may recommend that you be screened regularly for cancer of the pelvic organs (ovaries, uterus, and vagina). This screening involves a pelvic examination, including checking for microscopic changes to the surface of your cervix (Pap test). You may be encouraged to have this screening done every 3 years, beginning at age 67.  For women ages 29-65, health care providers may recommend pelvic exams and Pap testing every 3 years, or they may recommend the Pap and pelvic exam, combined with testing for human papilloma virus (HPV), every 5 years. Some types of HPV increase your risk of cervical cancer. Testing for HPV may also be done on women of any age with unclear Pap test results.  Other health care providers may not recommend any screening for nonpregnant women who are considered low risk for pelvic cancer and who do not have symptoms. Ask your health care provider if a screening pelvic exam is right for you.  If you have had past treatment for cervical cancer or a condition that could lead to cancer, you need Pap tests and screening for cancer for at least 20 years after your treatment. If Pap tests have been discontinued, your risk factors (such as having a new sexual partner) need to be reassessed to determine if screening should resume. Some women have medical problems that increase the chance of getting cervical cancer. In these cases, your health care provider may recommend more frequent screening and Pap tests.  Colorectal Cancer  This type of cancer can be detected and often prevented.  Routine colorectal cancer screening usually begins at 67 years of age and continues through 67 years of age.  Your health care provider may recommend screening at an earlier age if you have risk factors for colon cancer.  Your health care provider may also recommend using home test kits to  check for hidden blood in the stool.  A small camera at the end of a tube can be used to examine your colon directly (sigmoidoscopy or colonoscopy). This is done to check for the earliest forms of colorectal cancer.  Routine screening usually begins at age 45.  Direct examination of the colon should be repeated every 5-10 years through 67 years of age. However, you may need to be screened more often if early forms of precancerous polyps or small growths are found.  Skin Cancer  Check your skin from head to toe regularly.  Tell your health care provider about any new moles or changes in moles, especially if there is a change in a mole's shape or color.  Also tell your health care provider if you have a mole that is larger than the size of a pencil eraser.  Always use sunscreen. Apply sunscreen liberally and repeatedly throughout the day.  Protect yourself by wearing long sleeves, pants, a wide-brimmed hat, and sunglasses whenever you are outside.  Heart disease, diabetes, and  high blood pressure  High blood pressure causes heart disease and increases the risk of stroke. High blood pressure is more likely to develop in: ? People who have blood pressure in the high end of the normal range (130-139/85-89 mm Hg). ? People who are overweight or obese. ? People who are African American.  If you are 18-39 years of age, have your blood pressure checked every 3-5 years. If you are 40 years of age or older, have your blood pressure checked every year. You should have your blood pressure measured twice-once when you are at a hospital or clinic, and once when you are not at a hospital or clinic. Record the average of the two measurements. To check your blood pressure when you are not at a hospital or clinic, you can use: ? An automated blood pressure machine at a pharmacy. ? A home blood pressure monitor.  If you are between 55 years and 79 years old, ask your health care provider if you should  take aspirin to prevent strokes.  Have regular diabetes screenings. This involves taking a blood sample to check your fasting blood sugar level. ? If you are at a normal weight and have a low risk for diabetes, have this test once every three years after 67 years of age. ? If you are overweight and have a high risk for diabetes, consider being tested at a younger age or more often. Preventing infection Hepatitis B  If you have a higher risk for hepatitis B, you should be screened for this virus. You are considered at high risk for hepatitis B if: ? You were born in a country where hepatitis B is common. Ask your health care provider which countries are considered high risk. ? Your parents were born in a high-risk country, and you have not been immunized against hepatitis B (hepatitis B vaccine). ? You have HIV or AIDS. ? You use needles to inject street drugs. ? You live with someone who has hepatitis B. ? You have had sex with someone who has hepatitis B. ? You get hemodialysis treatment. ? You take certain medicines for conditions, including cancer, organ transplantation, and autoimmune conditions.  Hepatitis C  Blood testing is recommended for: ? Everyone born from 1945 through 1965. ? Anyone with known risk factors for hepatitis C.  Sexually transmitted infections (STIs)  You should be screened for sexually transmitted infections (STIs) including gonorrhea and chlamydia if: ? You are sexually active and are younger than 67 years of age. ? You are older than 67 years of age and your health care provider tells you that you are at risk for this type of infection. ? Your sexual activity has changed since you were last screened and you are at an increased risk for chlamydia or gonorrhea. Ask your health care provider if you are at risk.  If you do not have HIV, but are at risk, it may be recommended that you take a prescription medicine daily to prevent HIV infection. This is called  pre-exposure prophylaxis (PrEP). You are considered at risk if: ? You are sexually active and do not regularly use condoms or know the HIV status of your partner(s). ? You take drugs by injection. ? You are sexually active with a partner who has HIV.  Talk with your health care provider about whether you are at high risk of being infected with HIV. If you choose to begin PrEP, you should first be tested for HIV. You should then be   tested every 3 months for as long as you are taking PrEP. Pregnancy  If you are premenopausal and you may become pregnant, ask your health care provider about preconception counseling.  If you may become pregnant, take 400 to 800 micrograms (mcg) of folic acid every day.  If you want to prevent pregnancy, talk to your health care provider about birth control (contraception). Osteoporosis and menopause  Osteoporosis is a disease in which the bones lose minerals and strength with aging. This can result in serious bone fractures. Your risk for osteoporosis can be identified using a bone density scan.  If you are 65 years of age or older, or if you are at risk for osteoporosis and fractures, ask your health care provider if you should be screened.  Ask your health care provider whether you should take a calcium or vitamin D supplement to lower your risk for osteoporosis.  Menopause may have certain physical symptoms and risks.  Hormone replacement therapy may reduce some of these symptoms and risks. Talk to your health care provider about whether hormone replacement therapy is right for you. Follow these instructions at home:  Schedule regular health, dental, and eye exams.  Stay current with your immunizations.  Do not use any tobacco products including cigarettes, chewing tobacco, or electronic cigarettes.  If you are pregnant, do not drink alcohol.  If you are breastfeeding, limit how much and how often you drink alcohol.  Limit alcohol intake to no more  than 1 drink per day for nonpregnant women. One drink equals 12 ounces of beer, 5 ounces of wine, or 1 ounces of hard liquor.  Do not use street drugs.  Do not share needles.  Ask your health care provider for help if you need support or information about quitting drugs.  Tell your health care provider if you often feel depressed.  Tell your health care provider if you have ever been abused or do not feel safe at home. This information is not intended to replace advice given to you by your health care provider. Make sure you discuss any questions you have with your health care provider. Document Released: 04/23/2011 Document Revised: 03/15/2016 Document Reviewed: 07/12/2015 Elsevier Interactive Patient Education  2018 [employer]vier Inc[employer]  

## 2017-05-15 ENCOUNTER — Encounter (HOSPITAL_COMMUNITY)
Admission: RE | Admit: 2017-05-15 | Discharge: 2017-05-15 | Disposition: A | Discharge status: Home / Self Care | Payer: Self-pay | Source: Ambulatory Visit | Attending: Internal Medicine | Admitting: Internal Medicine

## 2017-05-16 ENCOUNTER — Telehealth: Payer: Self-pay | Admitting: Internal Medicine

## 2017-05-16 NOTE — Telephone Encounter (Signed)
Notified patient's husband that clearance for surgery will be addressed at August appt. He will make patient aware.   LM for surg scheduler at [place] with same info

## 2017-05-16 NOTE — Telephone Encounter (Signed)
Will address clearance at follow-up on 8/20.  Dr. Lemmie Evens

## 2017-05-16 NOTE — Telephone Encounter (Signed)
Natalie Hall requests clearance for: 1. Type of surgery: left shoulder: left shoulder reverse TSA 2. Date of surgery: pending 3. Surgeon: Dr. Esmond Plants 4. Medications that need to be held & how long: none specified - on ASA & Brilinta 5. Fax and/or Phone: (p) [phone]  (f) [phone]  -- Natalie Hall (surgical scheduler)   ** stent 03/14/2016 ** office visit with Dr. Debara Pickett June 10, 2017

## 2017-05-17 ENCOUNTER — Encounter (HOSPITAL_COMMUNITY)
Admission: RE | Admit: 2017-05-17 | Discharge: 2017-05-17 | Disposition: A | Discharge status: Home / Self Care | Payer: Self-pay | Source: Ambulatory Visit | Attending: Internal Medicine | Admitting: Internal Medicine

## 2017-05-17 ENCOUNTER — Other Ambulatory Visit (INDEPENDENT_AMBULATORY_CARE_PROVIDER_SITE_OTHER): Payer: Medicare Other

## 2017-05-17 DIAGNOSIS — N390 Urinary tract infection, site not specified: Secondary | ICD-10-CM | POA: Diagnosis not present

## 2017-05-17 LAB — POC URINALSYSI DIPSTICK (AUTOMATED)
Bilirubin, UA: NEGATIVE
Blood, UA: NEGATIVE
Glucose, UA: NEGATIVE
Ketones, UA: NEGATIVE
LEUKOCYTES UA: NEGATIVE
NITRITE UA: NEGATIVE
PH UA: 6 (ref 5.0–8.0)
PROTEIN UA: NEGATIVE
Spec Grav, UA: 1.015 (ref 1.010–1.025)
UROBILINOGEN UA: 0.2 U/dL

## 2017-05-20 ENCOUNTER — Encounter (HOSPITAL_COMMUNITY)
Admission: RE | Admit: 2017-05-20 | Discharge: 2017-05-20 | Disposition: A | Discharge status: Home / Self Care | Payer: Self-pay | Source: Ambulatory Visit | Attending: Internal Medicine | Admitting: Internal Medicine

## 2017-05-22 ENCOUNTER — Encounter (HOSPITAL_COMMUNITY)
Admission: RE | Admit: 2017-05-22 | Discharge: 2017-05-22 | Disposition: A | Discharge status: Home / Self Care | Payer: Self-pay | Source: Ambulatory Visit | Attending: Internal Medicine | Admitting: Internal Medicine

## 2017-05-22 DIAGNOSIS — Z955 Presence of coronary angioplasty implant and graft: Secondary | ICD-10-CM | POA: Insufficient documentation

## 2017-05-22 DIAGNOSIS — Z9889 Other specified postprocedural states: Secondary | ICD-10-CM | POA: Insufficient documentation

## 2017-05-22 DIAGNOSIS — Z5189 Encounter for other specified aftercare: Secondary | ICD-10-CM | POA: Insufficient documentation

## 2017-05-24 ENCOUNTER — Encounter (HOSPITAL_COMMUNITY): Payer: Self-pay

## 2017-05-27 ENCOUNTER — Encounter (HOSPITAL_COMMUNITY)
Admission: RE | Admit: 2017-05-27 | Discharge: 2017-05-27 | Disposition: A | Discharge status: Home / Self Care | Payer: Medicare Other | Source: Ambulatory Visit | Attending: Internal Medicine | Admitting: Internal Medicine

## 2017-05-29 ENCOUNTER — Encounter (HOSPITAL_COMMUNITY): Payer: Self-pay

## 2017-05-31 ENCOUNTER — Encounter (HOSPITAL_COMMUNITY): Payer: Self-pay

## 2017-06-03 ENCOUNTER — Encounter (HOSPITAL_COMMUNITY)
Admission: RE | Admit: 2017-06-03 | Discharge: 2017-06-03 | Disposition: A | Discharge status: Home / Self Care | Payer: Self-pay | Source: Ambulatory Visit | Attending: Internal Medicine | Admitting: Internal Medicine

## 2017-06-04 ENCOUNTER — Other Ambulatory Visit: Payer: Self-pay | Admitting: Family Medicine

## 2017-06-05 ENCOUNTER — Encounter (HOSPITAL_COMMUNITY)
Admission: RE | Admit: 2017-06-05 | Discharge: 2017-06-05 | Disposition: A | Discharge status: Home / Self Care | Payer: Self-pay | Source: Ambulatory Visit | Attending: Internal Medicine | Admitting: Internal Medicine

## 2017-06-07 ENCOUNTER — Encounter (HOSPITAL_COMMUNITY)
Admission: RE | Admit: 2017-06-07 | Discharge: 2017-06-07 | Disposition: A | Discharge status: Home / Self Care | Payer: Self-pay | Source: Ambulatory Visit | Attending: Internal Medicine | Admitting: Internal Medicine

## 2017-06-10 ENCOUNTER — Encounter (HOSPITAL_COMMUNITY)
Admission: RE | Admit: 2017-06-10 | Discharge: 2017-06-10 | Disposition: A | Discharge status: Home / Self Care | Payer: Self-pay | Source: Ambulatory Visit | Attending: Internal Medicine | Admitting: Internal Medicine

## 2017-06-10 ENCOUNTER — Ambulatory Visit (INDEPENDENT_AMBULATORY_CARE_PROVIDER_SITE_OTHER): Payer: Medicare Other | Admitting: Internal Medicine

## 2017-06-10 ENCOUNTER — Encounter: Payer: Self-pay | Admitting: Internal Medicine

## 2017-06-10 ENCOUNTER — Encounter: Payer: Self-pay | Admitting: *Deleted

## 2017-06-10 ENCOUNTER — Telehealth: Payer: Self-pay | Admitting: Internal Medicine

## 2017-06-10 VITALS — BP 110/68 | HR 76 | Ht 64.5 in | Wt 202.0 lb

## 2017-06-10 DIAGNOSIS — E782 Mixed hyperlipidemia: Secondary | ICD-10-CM

## 2017-06-10 DIAGNOSIS — Z01810 Encounter for preprocedural cardiovascular examination: Secondary | ICD-10-CM | POA: Diagnosis not present

## 2017-06-10 DIAGNOSIS — I10 Essential (primary) hypertension: Secondary | ICD-10-CM | POA: Diagnosis not present

## 2017-06-10 DIAGNOSIS — Z955 Presence of coronary angioplasty implant and graft: Secondary | ICD-10-CM

## 2017-06-10 DIAGNOSIS — I2510 Atherosclerotic heart disease of native coronary artery without angina pectoris: Secondary | ICD-10-CM | POA: Diagnosis not present

## 2017-06-10 DIAGNOSIS — Z006 Encounter for examination for normal comparison and control in clinical research program: Secondary | ICD-10-CM

## 2017-06-10 MED ORDER — RAMIPRIL 10 MG PO CAPS: 10.0000 mg | ORAL_CAPSULE | Freq: Every day | ORAL | 0 refills | Status: DC

## 2017-06-10 MED ORDER — EZETIMIBE 10 MG PO TABS: 10.0000 mg | ORAL_TABLET | Freq: Every day | ORAL | 3 refills | Status: DC

## 2017-06-10 MED ORDER — ROSUVASTATIN CALCIUM 40 MG PO TABS: 40.0000 mg | ORAL_TABLET | Freq: Every day | ORAL | 3 refills | Status: DC

## 2017-06-10 NOTE — Addendum Note (Signed)
Addended by: Fidel Levy on: 06/10/2017 05:05 PM   Modules accepted: Orders

## 2017-06-10 NOTE — Patient Instructions (Addendum)
Your physician has recommended you make the following change in your medication:  -- TAKE aspirin 81mg  once daily  Your physician recommends that you return for lab work in: [place] - fasting  You are cleared for surgery   Your physician wants you to follow-up in: 6 months with Dr. Debara Pickett. You will receive a reminder letter in the mail two months in advance. If you don't receive a letter, please call our office to schedule the follow-up appointment.

## 2017-06-10 NOTE — Telephone Encounter (Signed)
Faxed clearance to [facility] (Dr. Esmond Plants) for left shoulder reverse TSA - cleared for surgery + can hold ASA 7 days prior if needed, restart after.   (p) [phone] (f) [phone] Santiago Bur = surgical scheduler

## 2017-06-10 NOTE — Progress Notes (Signed)
OFFICE NOTE  Chief Complaint:  Follow-up, preoperative risk evaluation  Primary Care Physician: Laurey Morale, MD  HPI:  Natalie Hall is a 66 y.o. female who I last saw in the hospital a few weeks ago. She was admitted for an SVT which spontaneously converted in the ER on Cardizem and did not return. Unfortunately she had a non-ST elevation MI with troponin peak of 1.64. She underwent a Lexiscan Myoview which demonstrated a moderate region of reversibility in the mid and apical segments of the inferior lateral Hults consistent with ischemia. EF was 61%. She then was referred to cardiac catheterization which demonstrated the following:  Cardiac Cath 05/24   Prox RCA lesion, 25% stenosed.  The left ventricular systolic function is normal.  Prox LAD to Mid LAD lesion, 15% stenosed.  Mid RCA-1 lesion, 95% stenosed.  Mid RCA-2 lesion, 95% stenosed. Post intervention, there is a 0% residual stenosis.SYNERGY DES 3X32 1. Single vessel obstructive CAD 2. Normal LV function 3. Successful stenting of the mid to distal RCA with a DES, SYNERGY DES 3X32 Plan: DAPT for one year. Patient may be a candidate for the TWILIGHT trial in which case study protocol will be followed. Anticipate DC in am.  Natalie Hall returns today and is doing quite well. She denies any chest pain or worsening shortness of breath. She is compliant with her medications. She did enroll in the TWILIGHT trial. Unfortunately, she was considering shoulder surgery but this will be delayed due to the necessity of dual antiplatelet therapy with her recent stent. I advised her the Tylenol or her tramadol would be safe medicines used for pain.  07/13/2016  Since I last saw her, she was admitted to the hospital for adenosine-responsive AVNRT. She underwent successful catheter ablation and has not had recurrence. She denies any chest pain or dyspnea. She remains in the TWILIGHT trial with Brillinta. Recent lipid profilw shows LDL  remains not at goal at 104. HDL low at 28. She is on Crestor 10 mg daily.  10/05/2016  Natalie Hall returns today for follow-up. She seems to be doing well from a coronary standpoint. She denies any chest pain or worsening dyspnea. 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 She underwent placement of a radioactive seed which was ultimately removed after a week and she is recovering from that procedure. She was off of her aspirin for 1 week but remained on Brilinta. She is still in the twilight trial. Her cholesterol was higher than it had been previously. I increased her Crestor to 20 mg daily and we are awaiting her lipid profile which was recently drawn. She denies any recurrent AVNRT.  06/10/2017  Natalie Hall was seen today in follow-up. She was seen in the [facility] for follow-up of the Twilight trial - this has completed. She is now more than one year since drug-eluting stent placement. She is angina free. At this point she will no longer need dual antiplatelet therapy and I advised that she go on aspirin 81 mg daily. Cholesterol is now at goal based on labs 3 months ago which showed total cholesterol 139, triglycerides 177, HL 34 and LDL-C is 69. She is on Crestor 40 mg daily. She is contemplating left shoulder surgery and from my standpoint she is clear for that.  PMHx:  Past Medical History:  Diagnosis Date  . Asthma    infrequent problem  . Choroidal nevus of left eye    sees  Dr. Gerarda Fraction at [place].   . GERD (gastroesophageal reflux disease)   . Hx of colonic polyp   . Hyperlipidemia   . Hypertension   . Melanoma of eye, left ([place])    sees Dr. Cordelia Pen  . Osteoarthritis   . Supraventricular tachycardia [facility])     Past Surgical History:  Procedure Laterality Date  . ABDOMINAL HYSTERECTOMY    . ACHILLES TENDON REPAIR  10-07-11   torn on right, per Dr. Noemi Chapel   . ANKLE SURGERY     torn tendon left ankle  . BREAST BIOPSY    .  CARDIAC CATHETERIZATION N/A 03/14/2016   Procedure: Left Heart Cath and Coronary Angiography;  Surgeon: Peter M Martinique, MD;  Location: [place] [facility];  Service: Cardiovascular;  Laterality: N/A;  . CARDIAC CATHETERIZATION N/A 03/14/2016   Procedure: Coronary Stent Intervention;  Surgeon: Peter M Martinique, MD;  Location: [place] [facility];  Service: Cardiovascular;  Laterality: N/A;  . CARPAL TUNNEL RELEASE    . CHOLECYSTECTOMY    . COLONOSCOPY  06-23-14   per Dr. Ardis Hughs, adenomatous polyps, repeat in 5 yrs    . ELECTROPHYSIOLOGIC STUDY N/A 05/21/2016   Procedure: SVT Ablation;  Surgeon: [name], MD;  Location: [place] [facility];  Service: Cardiovascular;  Laterality: N/A;  . KNEE CLOSED REDUCTION Left 02/03/2014   Procedure: CLOSED MANIPULATION LEFT KNEE;  Surgeon: Gearlean Alf, MD;  Location: [facility];  Service: Orthopedics;  Laterality: Left;  . POLYPECTOMY    . TOTAL KNEE ARTHROPLASTY Left 12/21/2013   Procedure: LEFT TOTAL KNEE ARTHROPLASTY;  Surgeon: Gearlean Alf, MD;  Location: [facility];  Service: Orthopedics;  Laterality: Left;  Marland Kitchen VARICOSE VEIN SURGERY      FAMHx:  Family History  Problem Relation Age of Onset  . Colon polyps Mother   . Colon cancer Maternal Aunt   . Liver cancer Maternal Aunt   . Breast cancer Unknown   . Coronary artery disease Unknown   . Colon cancer Unknown   . Diabetes Unknown   . Hypertension Unknown   . Kidney disease Unknown   . Lung cancer Unknown   . Lung cancer Brother        spleen, bone  . Esophageal cancer Neg Hx   . Stomach cancer Neg Hx   . Rectal cancer Neg Hx     SOCHx:   reports that she has never smoked. She has never used smokeless tobacco. She reports that she does not drink alcohol or use drugs.  ALLERGIES:  Allergies  Allergen Reactions  . Levaquin [Levofloxacin In D5w] Other (See Comments)    Leg pain   . Augmentin [Amoxicillin-Pot Clavulanate] Rash  . Codeine Other (See Comments)    flushing  . Lexapro  [Escitalopram Oxalate] Rash    ROS: Pertinent items noted in HPI and remainder of comprehensive ROS otherwise negative.  HOME MEDS: Current Outpatient Prescriptions  Medication Sig Dispense Refill  . CALCIUM PO Take 1 tablet by mouth daily.     . Cholecalciferol (VITAMIN D-3 PO) Take 1 tablet by mouth daily.     Marland Kitchen levocetirizine (XYZAL) 5 MG tablet Take 5 mg by mouth every evening.    . metoprolol succinate (TOPROL-XL) 25 MG 24 hr tablet TAKE 25MG  TWICE A DAY 180 tablet 3  . Multiple Vitamins-Minerals (MULTIVITAMIN PO) Take 1 tablet by mouth daily.     . nitroGLYCERIN (NITROSTAT) 0.4 MG SL tablet DISSOLVE 1 TABLET UNDER THE TONGUE EVERY  5 MINUTES FOR  3 DOSES AS NEEDED FOR CHEST PAIN 100 tablet 1  . omeprazole (PRILOSEC) 40 MG capsule TAKE 1 CAPSULE BY MOUTH  DAILY 90 capsule 0  . ramipril (ALTACE) 10 MG capsule TAKE 1 CAPSULE BY MOUTH  DAILY 90 capsule 0  . triamcinolone (NASACORT) 55 MCG/ACT nasal inhaler Place 2 sprays into the nose daily. 3 Inhaler 3  . rosuvastatin (CRESTOR) 40 MG tablet Take 1 tablet (40 mg total) by mouth daily. 90 tablet 3   No current facility-administered medications for this visit.     LABS/IMAGING: No results found for this or any previous visit (from the past 48 hour(s)). No results found.  WEIGHTS: Wt Readings from Last 3 Encounters:  06/10/17 202 lb (91.6 kg)  05/14/17 201 lb 12.8 oz (91.5 kg)  02/19/17 197 lb (89.4 kg)    VITALS: BP 110/68   Pulse 76   Ht 5' 4.5" (1.638 m)   Wt 202 lb (91.6 kg)   LMP  (LMP Unknown)   BMI 34.14 kg/m   EXAM: General appearance: alert and no distress Neck: no carotid bruit and no JVD Lungs: clear to auscultation bilaterally Heart: regular rate and rhythm, S1, S2 normal, no murmur, click, rub or gallop Abdomen: soft, non-tender; bowel sounds normal; no masses,  no organomegaly Extremities: extremities normal, atraumatic, no cyanosis or edema Pulses: 2+ and symmetric Skin: Skin color, texture, turgor  normal. No rashes or lesions Neurologic: Grossly normal Psych: Pleasant  EKG: Normal sinus rhythm at 76-personally reviewed  ASSESSMENT: 1. Coronary artery disease status post PCI with a drug-eluting stent to the mid/distal RCA (02/2016) 2. AVNRT - s/p catheter ablation in 04/2016 3. S/p NSTEMI with abnormal nuclear stress test 4. Dyslipidemia 5. Hypertension 6. Except will risk for upcoming shoulder surgery  PLAN: 1.   Natalie Hall is planning on upcoming left shoulder surgery. She is now more than a year after drug-eluting stent placement is acceptable risk for that. She will be able to stop aspirin 7 days prior to surgery and restart it afterwards if necessary. She has been taken off of Brilinta permanently and is now out of the twilight trial. Cholesterol appears to be at goal. [facility] recheck a lipid profile in 3 months to ensure stability with recent weight gain. Blood pressure is at goal as well. I have encouraged continued maintenance cardiac rehabilitation and of course she will likely undergo rehabilitation after shoulder surgery. Follow-up with me in 6 months.   Pixie Casino, MD, [facility] Attending Cardiologist [place] 06/10/2017, 2:19 PM

## 2017-06-10 NOTE — Progress Notes (Addendum)
Pt here today for her 15 month follow up visit. She is doing well, with no complaints of CP or SOB. She has a follow up appt with Dr Debara Pickett this afternoon and at that time he will direct her as to what meds to continue/stop/start. She has been 100% on the medications as of today. Her next follow up visit will be in November 1 - 29 window, for her last phone visit. Will append note after she sees Dr Debara Pickett today. Bottles returned are [id number], and [id number], and [id number] was not returned but she said the bottle was empty.   1501: Pt saw Dr Debara Pickett in the clinic today and he decided to put her on ASA 81 mg daily. No more Brilinta. We will call her in November for her last phone visit for the trial.

## 2017-06-11 DIAGNOSIS — H3589 Other specified retinal disorders: Secondary | ICD-10-CM | POA: Insufficient documentation

## 2017-06-12 ENCOUNTER — Encounter (HOSPITAL_COMMUNITY)
Admission: RE | Admit: 2017-06-12 | Discharge: 2017-06-12 | Disposition: A | Discharge status: Home / Self Care | Payer: Self-pay | Source: Ambulatory Visit | Attending: Internal Medicine | Admitting: Internal Medicine

## 2017-06-14 ENCOUNTER — Encounter (HOSPITAL_COMMUNITY)
Admission: RE | Admit: 2017-06-14 | Discharge: 2017-06-14 | Disposition: A | Discharge status: Home / Self Care | Payer: Self-pay | Source: Ambulatory Visit | Attending: Internal Medicine | Admitting: Internal Medicine

## 2017-06-17 ENCOUNTER — Encounter (HOSPITAL_COMMUNITY)
Admission: RE | Admit: 2017-06-17 | Discharge: 2017-06-17 | Disposition: A | Discharge status: Home / Self Care | Payer: Self-pay | Source: Ambulatory Visit | Attending: Internal Medicine | Admitting: Internal Medicine

## 2017-06-19 ENCOUNTER — Encounter (HOSPITAL_COMMUNITY)
Admission: RE | Admit: 2017-06-19 | Discharge: 2017-06-19 | Disposition: A | Discharge status: Home / Self Care | Payer: Medicare Other | Source: Ambulatory Visit | Attending: Internal Medicine | Admitting: Internal Medicine

## 2017-06-21 ENCOUNTER — Encounter (HOSPITAL_COMMUNITY): Payer: Self-pay

## 2017-06-21 ENCOUNTER — Encounter (HOSPITAL_COMMUNITY)
Admission: RE | Admit: 2017-06-21 | Discharge: 2017-06-21 | Disposition: A | Discharge status: Home / Self Care | Payer: Self-pay | Source: Ambulatory Visit | Attending: Internal Medicine | Admitting: Internal Medicine

## 2017-06-21 NOTE — H&P (Signed)
Natalie Hall is an 66 y.o. female.    Chief Complaint: left shoulder pain  HPI: Pt is a 66 y.o. female complaining of left shoulder pain for multiple years. Pain had continually increased since the beginning. X-rays in the clinic show end-stage arthritic changes of the left shoulder. Pt has tried various conservative treatments which have failed to alleviate their symptoms, including injections and therapy. Various options are discussed with the patient. Risks, benefits and expectations were discussed with the patient. Patient understand the risks, benefits and expectations and wishes to proceed with surgery.   PCP:  Laurey Morale, MD  D/C Plans: Home  PMH: Past Medical History:  Diagnosis Date  . Asthma    infrequent problem  . Choroidal nevus of left eye    se[facility] Dr. Gerarda [facility]raction at [facility].   . GERD (gastroesophageal reflux disease)   . Hx of colonic polyp   . Hyperlipidemia   . Hypertension   . Melanoma of eye, left ([place])    sees Dr. Cordelia Pen  . Osteoarthritis   . Supraventricular tachycardia ([facility])     PSH: Past Surgical History:  Procedure Laterality Date  . ABDOMINAL HYSTERECTOMY    . ACHILLES TENDON REPAIR  10-07-11   torn on right, per Dr. Noemi Chapel   . ANKLE SURGERY     torn tendon left ankle  . BREAST BIOPSY    . CARDIAC CATHETERIZATION N/A 03/14/2016   Procedure: Left Heart Cath and Coronary Angiography;  Surgeon: Peter M Martinique, MD;  Location: [place] [facility];  Service: Cardiovascular;  Laterality: N/A;  . CARDIAC CATHETERIZATION N/A 03/14/2016   Procedure: Coronary Stent Intervention;  Surgeon: Peter M Martinique, MD;  Location: [place] [facility];  Service: Cardiovascular;  Laterality: N/A;  . CARPAL TUNNEL RELEASE    . CHOLECYSTECTOMY    . COLONOSCOPY  06-23-14   per Dr. Ardis Hughs, adenomatous polyps, repeat in 5 yrs    . ELECTROPHYSIOLOGIC STUDY N/A 05/21/2016   Procedure: SVT Ablation;  Surgeon: [name], MD;  Location: [place] [facility];   Service: Cardiovascular;  Laterality: N/A;  . KNEE CLOSED REDUCTION Left 02/03/2014   Procedure: CLOSED MANIPULATION LEFT KNEE;  Surgeon: Gearlean Alf, MD;  [facility]: [facility];  Service: Orthopedics;  Laterality: Left;  . POLYPECTOMY    . TOTAL KNEE ARTHROPLASTY Left 12/21/2013   Procedure: LEFT TOTAL KNEE ARTHROPLASTY;  Surgeon: Gearlean Alf, MD;  [facility]: [facility];  Service: Orthopedics;  Laterality: Left;  Marland Kitchen VARICOSE VEIN SURGERY      Social History:  reports that she has never smoked. She has never used smokeless tobacco. She reports that she does not drink alcohol or use drugs.  Allergies:  Allergies  Allergen Reactions  . Levaquin [Levofloxacin In D5w] Other (See Comments)    Leg pain   . Augmentin [Amoxicillin-Pot Clavulanate] Rash    Has patient had a PCN reaction causing immediate rash, facial/tongue/throat swelling, SOB or lightheadedness with hypotension: No Has patient had a PCN reaction causing severe rash involving mucus membranes or skin necrosis: No Has patient had a PCN reaction that required hospitalization: No Has patient had a PCN reaction occurring within the last 10 years: Yes If all of the above answers are "NO", then may proceed with Cephalosporin use.   . Codeine Other (See Comments)    flushing  . Lexapro [Escitalopram Oxalate] Rash    Medications: No current facility-administered medications for this encounter.    Current Outpatient Prescriptions  Medication Sig Dispense Refill  . Calcium-Magnesium-Vitamin D (CALCIUM 1200+D3 PO) Take 1 tablet by mouth daily.    . cholecalciferol (VITAMIN D) 1000 units tablet Take 1,000 Units by mouth daily.    . diphenhydramine-acetaminophen (TYLENOL PM) 25-500 MG TABS tablet Take 2 tablets by mouth at bedtime as needed (for pain.).    Marland Kitchen levocetirizine (XYZAL) 5 MG tablet Take 5 mg by mouth daily.     . metoprolol succinate (TOPROL-XL) 25 MG 24 hr tablet TAKE 25MG  TWICE A DAY 180 tablet 3  . nitroGLYCERIN (NITROSTAT)  0.4 MG SL tablet DISSOLVE 1 TABLET UNDER THE TONGUE EVERY 5 MINUTES FOR  3 DOSES AS NEEDED FOR CHEST PAIN 100 tablet 1  . omeprazole (PRILOSEC) 40 MG capsule TAKE 1 CAPSULE BY MOUTH  DAILY 90 capsule 0  . ramipril (ALTACE) 10 MG capsule Take 1 capsule (10 mg total) by mouth daily. 90 capsule 0  . rosuvastatin (CRESTOR) 40 MG tablet Take 1 tablet (40 mg total) by mouth daily. 90 tablet 3  . traMADol (ULTRAM) 50 MG tablet Take 50 mg by mouth at bedtime as needed (for pain.).    Marland Kitchen triamcinolone (NASACORT) 55 MCG/ACT nasal inhaler Place 2 sprays into the nose daily. 3 Inhaler 3  . triamcinolone cream (KENALOG) 0.1 % Apply 1 application topically 2 (two) times daily as needed (for affected area(s) of legs.).    Marland Kitchen aspirin EC 81 MG tablet Take 81 mg by mouth daily.    . Multiple Vitamins-Minerals (MULTIVITAMIN PO) Take 1 tablet by mouth daily.       No results found for this or any previous visit (from the past 48 hour(s)). No results found.  ROS: Pain with rom of the left upper extremity  Physical Exam:  Alert and oriented 66 y.o. female in no acute distress Cranial nerves 2-12 intact Cervical spine: full rom with no tenderness, nv intact distally Chest: active breath sounds bilaterally, no wheeze rhonchi or rales Heart: regular rate and rhythm, no murmur Abd: non tender non distended with active bowel sounds Hip is stable with rom  Left shoulder with moderate crepitus and weakness with rom nv intact distally No rashes or edema  Assessment/Plan Assessment: left shoulder cuff arthropathy  Plan: Patient will undergo a left reverse total shoulder by Dr. [name] at [facility]. Risks benefits and expectations were discussed with the patient. Patient understand risks, benefits and expectations and wishes to proceed.

## 2017-06-25 ENCOUNTER — Encounter (HOSPITAL_COMMUNITY)
Admission: RE | Admit: 2017-06-25 | Discharge: 2017-06-25 | Disposition: A | Discharge status: Home / Self Care | Payer: Medicare Other | Source: Ambulatory Visit | Attending: Orthopedic Surgery | Admitting: Orthopedic Surgery

## 2017-06-25 ENCOUNTER — Encounter (HOSPITAL_COMMUNITY): Payer: Self-pay

## 2017-06-25 HISTORY — DX: Personal history of other diseases of the digestive system: Z87.19

## 2017-06-25 HISTORY — DX: Atherosclerotic heart disease of native coronary artery without angina pectoris: I25.10

## 2017-06-25 HISTORY — DX: Acute myocardial infarction, unspecified: I21.9

## 2017-06-25 HISTORY — DX: Cardiac arrhythmia, unspecified: I49.9

## 2017-06-25 LAB — CBC
HCT: 38.8 % (ref 36.0–46.0)
Hemoglobin: 12.8 g/dL (ref 12.0–15.0)
MCH: 28.7 pg (ref 26.0–34.0)
MCHC: 33 g/dL (ref 30.0–36.0)
MCV: 87 fL (ref 78.0–100.0)
Platelets: 191 10*3/uL (ref 150–400)
RBC: 4.46 MIL/uL (ref 3.87–5.11)
RDW: 14.5 % (ref 11.5–15.5)
WBC: 4.7 10*3/uL (ref 4.0–10.5)

## 2017-06-25 LAB — SURGICAL PCR SCREEN
MRSA, PCR: NEGATIVE
Staphylococcus aureus: NEGATIVE

## 2017-06-25 LAB — BASIC METABOLIC PANEL
ANION GAP: 6 (ref 5–15)
BUN: 13 mg/dL (ref 6–20)
CALCIUM: 9.2 mg/dL (ref 8.9–10.3)
CHLORIDE: 106 mmol/L (ref 101–111)
CO2: 27 mmol/L (ref 22–32)
Creatinine, Ser: 0.6 mg/dL (ref 0.44–1.00)
GFR calc Af Amer: >60 mL/min (ref >60)
GFR calc non Af Amer: >60 mL/min (ref >60)
GLUCOSE: 93 mg/dL (ref 65–99)
POTASSIUM: 4.4 mmol/L (ref 3.5–5.1)
Sodium: 139 mmol/L (ref 135–145)

## 2017-06-25 NOTE — Progress Notes (Signed)
PCP - Dr. Sarajane Jews Cardiologist - Dr. Debara Pickett  - cardiac clearance in EPIC from Dr. Debara Pickett on 06/10/2017  Chest x-ray - n/a EKG - 06/10/2017 Stress Test -  03/13/2016 ECHO - 03/12/2016 Cardiac Cath - 03/14/2016 SVT Ablation/EP Study - 05/21/2016   Sleep Study - patient denies   Patient denies shortness of breath, fever, cough and chest pain at PAT appointment.  Last dose of ASA was on 06/19/2017.   Patient verbalized understanding of instructions that were given to them at the PAT appointment. Patient was also instructed that they will need to review over the PAT instructions again at home before surgery.  Anesthesia review for cardiac history.

## 2017-06-25 NOTE — Pre-Procedure Instructions (Signed)
Natalie Hall  06/25/2017      [id number] - [place], [place] - [address] [place] [address] Phone: [phone] Fax: [phone]  [place], [place] [address] [place] [address] Phone: [phone] Fax: [phone]    Your procedure is scheduled on Friday, June 28, 2017.  Report to [facility] Admitting at [phone] A.M.  Call this number if you have problems the morning of surgery:  [phone]   Remember:  Do not eat food or drink liquids after midnight.  Continue all other medications as directed by your physician except follow these instructions about your medications   Take these medicines the morning of surgery with A SIP OF WATER: levocetirizine (Xyzal) Metoprolol succunate (Toprol-XL) Omeprazole (Prilosec) trimcinolone (Nasacort)  7 days prior to surgery STOP taking any Aleve, Naproxen, Ibuprofen, Motrin, Advil, Goody's, BC's, all herbal medications, fish oil, and all vitamins.  Follow your doctors instructions regarding your Aspirin.  If no instructions were given by the doctor you will need to call the office to get instructions.  Your pre admission RN will also call for those instructions    Do not wear jewelry, make-up or nail polish.  Do not wear lotions, powders, or perfumes, or deodorant.  Do not shave 48 hours prior to surgery.    Do not bring valuables to the hospital.  [facility] is not responsible for any belongings or valuables.  Contacts, eyeglasses, dentures or bridgework may not be worn into surgery.  Leave your suitcase in the car.  After surgery it may be brought to your room.  For patients admitted to the hospital, discharge time will be determined by your treatment team.  Patients discharged the day of surgery will not be allowed to drive home.   Name and phone number of your driver:    Special instructions:    [place]- Preparing For Surgery  Before surgery, you can play an important role. Because skin is not sterile, your skin needs to be as free of germs as possible. You can reduce the number of germs on your skin by washing with CHG (chlorahexidine gluconate) Soap before surgery.  CHG is an antiseptic cleaner which kills germs and bonds with the skin to continue killing germs even after washing.  Please do not use if you have an allergy to CHG or antibacterial soaps. If your skin becomes reddened/irritated stop using the CHG.  Do not shave (including legs and underarms) for at least 48 hours prior to first CHG shower. It is OK to shave your face.  Please follow these instructions carefully.   1. Shower the NIGHT BEFORE SURGERY and the MORNING OF SURGERY with CHG.   2. If you chose to wash your hair, wash your hair first as usual with your normal shampoo.  3. After you shampoo, rinse your hair and body thoroughly to remove the shampoo.  4. Use CHG as you would any other liquid soap. You can apply CHG directly to the skin and wash gently with a scrungie or a clean washcloth.   5. Apply the CHG Soap to your body ONLY FROM THE NECK DOWN.  Do not use on open wounds or open sores. Avoid contact with your eyes, ears, mouth and genitals (private parts). Wash genitals (private parts) with your normal soap.  6. Wash thoroughly, paying special attention to the area  where your surgery will be performed.  7. Thoroughly rinse your body with warm water from the neck down.  8. DO NOT shower/wash with your normal soap after using and rinsing off the CHG Soap.  9. Pat yourself dry with a CLEAN TOWEL.   10. Wear CLEAN PAJAMAS   11. Place CLEAN SHEETS on your bed the night of your first shower and DO NOT SLEEP WITH PETS.    Day of Surgery: Shower as stated above Do not apply any deodorants/lotions/powders/perfumes.  Please wear clean clothes to the hospital/surgery center.      Please read  over the following fact sheets that you were given. Pain Booklet, Coughing and Deep Breathing, Total Joint Packet, MRSA Information and Surgical Site Infection Prevention

## 2017-06-26 NOTE — Progress Notes (Signed)
Anesthesia Chart Review:  Pt is a 66 year old female scheduled for L reverse shoulder arthroplasty on 06/28/2017 with Netta Cedars, MD  - PCP is Alysia Penna, MD - Cardiologist is Lyman Bishop, MD who cleared pt for surgery at last office visit 06/10/17  PMH includes:  CAD (DES to RCA 524/17), SVT (s/p ablation 05/21/16), HTN, hyperlipidemia, asthma, melanoma in L eye, GERD.  Never smoker. BMI 34.5  Medications include: ASA 81mg , metoprolol, prilosec, ramipril, rosuvastatin  Preoperative labs reviewed.    CT chest 03/12/17:  - Two 3-4 mm subpleural nodules in the right lung, unchanged. Six month stability has been demonstrated. Given the of benign appearance and lack of interval change, these can be considered benign. Dedicated follow-up imaging is not required.  EKG 06/10/17: NSR. Septal infarct, age undetermined.   Cardiac cath 03/14/16 (done for abnormal stress test):   Prox RCA lesion, 25% stenosed.  The left ventricular systolic function is normal.  Prox LAD to Mid LAD lesion, 15% stenosed.  Mid RCA-1 lesion, 95% stenosed.  Mid RCA-2 lesion, 95% stenosed. Post intervention, there is a 0% residual stenosis. 1. Single vessel obstructive CAD 2. Normal LV function 3. Successful stenting of the mid to distal RCA with a DES  Echo 03/12/16:  - Left ventricle: The cavity size was normal. Rucci thickness was normal. Systolic function was vigorous. The estimated ejection fraction was in the range of 65% to 70%. Doppler parameters are consistent with abnormal left ventricular relaxation (grade 1 diastolic dysfunction).  If no changes, I anticipate pt can proceed with surgery as scheduled.   Willeen Cass, FNP-BC [facility] Short Stay Surgical Center/Anesthesiology Phone: [phone] 06/26/2017 10:20 AM

## 2017-06-28 ENCOUNTER — Inpatient Hospital Stay (HOSPITAL_COMMUNITY): Payer: Medicare Other | Admitting: Emergency Medicine

## 2017-06-28 ENCOUNTER — Inpatient Hospital Stay (HOSPITAL_COMMUNITY)
Admission: RE | Admit: 2017-06-28 | Discharge: 2017-06-29 | DRG: 483 | Disposition: A | Discharge status: Home / Self Care | Payer: Medicare Other | Source: Ambulatory Visit | Attending: Orthopedic Surgery | Admitting: Orthopedic Surgery

## 2017-06-28 ENCOUNTER — Encounter (HOSPITAL_COMMUNITY)
Admission: RE | Disposition: A | Discharge status: Home / Self Care | Payer: Self-pay | Source: Ambulatory Visit | Attending: Orthopedic Surgery

## 2017-06-28 ENCOUNTER — Encounter (HOSPITAL_COMMUNITY): Payer: Self-pay

## 2017-06-28 ENCOUNTER — Inpatient Hospital Stay (HOSPITAL_COMMUNITY): Payer: Medicare Other

## 2017-06-28 ENCOUNTER — Inpatient Hospital Stay (HOSPITAL_COMMUNITY): Payer: Medicare Other | Admitting: Anesthesiology

## 2017-06-28 DIAGNOSIS — Z7982 Long term (current) use of aspirin: Secondary | ICD-10-CM | POA: Diagnosis not present

## 2017-06-28 DIAGNOSIS — Z955 Presence of coronary angioplasty implant and graft: Secondary | ICD-10-CM | POA: Diagnosis not present

## 2017-06-28 DIAGNOSIS — Z885 Allergy status to narcotic agent status: Secondary | ICD-10-CM | POA: Diagnosis not present

## 2017-06-28 DIAGNOSIS — I10 Essential (primary) hypertension: Secondary | ICD-10-CM | POA: Diagnosis present

## 2017-06-28 DIAGNOSIS — I252 Old myocardial infarction: Secondary | ICD-10-CM

## 2017-06-28 DIAGNOSIS — Z8679 Personal history of other diseases of the circulatory system: Secondary | ICD-10-CM | POA: Diagnosis not present

## 2017-06-28 DIAGNOSIS — Z888 Allergy status to other drugs, medicaments and biological substances status: Secondary | ICD-10-CM

## 2017-06-28 DIAGNOSIS — Z96652 Presence of left artificial knee joint: Secondary | ICD-10-CM | POA: Diagnosis present

## 2017-06-28 DIAGNOSIS — E785 Hyperlipidemia, unspecified: Secondary | ICD-10-CM | POA: Diagnosis present

## 2017-06-28 DIAGNOSIS — K219 Gastro-esophageal reflux disease without esophagitis: Secondary | ICD-10-CM | POA: Diagnosis present

## 2017-06-28 DIAGNOSIS — J45909 Unspecified asthma, uncomplicated: Secondary | ICD-10-CM | POA: Diagnosis present

## 2017-06-28 DIAGNOSIS — Z881 Allergy status to other antibiotic agents status: Secondary | ICD-10-CM

## 2017-06-28 DIAGNOSIS — Z79899 Other long term (current) drug therapy: Secondary | ICD-10-CM | POA: Diagnosis not present

## 2017-06-28 DIAGNOSIS — Z96612 Presence of left artificial shoulder joint: Secondary | ICD-10-CM

## 2017-06-28 DIAGNOSIS — M19012 Primary osteoarthritis, left shoulder: Secondary | ICD-10-CM | POA: Diagnosis present

## 2017-06-28 HISTORY — PX: REVERSE SHOULDER ARTHROPLASTY: SHX5054

## 2017-06-28 SURGERY — ARTHROPLASTY, SHOULDER, TOTAL, REVERSE
Anesthesia: General | Site: Shoulder | Laterality: Left

## 2017-06-28 MED ORDER — ACETAMINOPHEN 500 MG PO TABS: 1000.0000 mg | ORAL_TABLET | Freq: Every evening | ORAL | Status: DC | PRN

## 2017-06-28 MED ORDER — FLUTICASONE PROPIONATE 50 MCG/ACT NA SUSP
2.0000 | Freq: Every day | NASAL | Status: DC
  Administered 2017-06-29: 2 via NASAL
  Filled 2017-06-28: qty 16

## 2017-06-28 MED ORDER — PHENOL 1.4 % MT LIQD: 1.0000 | OROMUCOSAL | Status: DC | PRN

## 2017-06-28 MED ORDER — PROPOFOL 10 MG/ML IV BOLUS
INTRAVENOUS | Status: DC | PRN
  Administered 2017-06-28: 100 mg via INTRAVENOUS

## 2017-06-28 MED ORDER — FENTANYL CITRATE (PF) 250 MCG/5ML IJ SOLN
INTRAMUSCULAR | Status: AC
  Filled 2017-06-28: qty 5

## 2017-06-28 MED ORDER — CHLORHEXIDINE GLUCONATE 4 % EX LIQD: 60.0000 mL | Freq: Once | CUTANEOUS | Status: DC

## 2017-06-28 MED ORDER — LIDOCAINE HCL (CARDIAC) 20 MG/ML IV SOLN
INTRAVENOUS | Status: DC | PRN
  Administered 2017-06-28: 40 mg via INTRAVENOUS

## 2017-06-28 MED ORDER — ONDANSETRON HCL 4 MG PO TABS: 4.0000 mg | ORAL_TABLET | Freq: Four times a day (QID) | ORAL | Status: DC | PRN

## 2017-06-28 MED ORDER — EPHEDRINE SULFATE-NACL 50-0.9 MG/10ML-% IV SOSY
PREFILLED_SYRINGE | INTRAVENOUS | Status: DC | PRN
  Administered 2017-06-28 (×2): 5 mg via INTRAVENOUS

## 2017-06-28 MED ORDER — ONDANSETRON HCL 4 MG/2ML IJ SOLN: 4.0000 mg | Freq: Four times a day (QID) | INTRAMUSCULAR | Status: DC | PRN

## 2017-06-28 MED ORDER — SUGAMMADEX SODIUM 200 MG/2ML IV SOLN
INTRAVENOUS | Status: DC | PRN
  Administered 2017-06-28: 200 mg via INTRAVENOUS

## 2017-06-28 MED ORDER — ROCURONIUM BROMIDE 100 MG/10ML IV SOLN
INTRAVENOUS | Status: DC | PRN
  Administered 2017-06-28: 30 mg via INTRAVENOUS

## 2017-06-28 MED ORDER — ACETAMINOPHEN 650 MG RE SUPP: 650.0000 mg | Freq: Four times a day (QID) | RECTAL | Status: DC | PRN

## 2017-06-28 MED ORDER — MENTHOL 3 MG MT LOZG: 1.0000 | LOZENGE | OROMUCOSAL | Status: DC | PRN

## 2017-06-28 MED ORDER — VITAMIN D3 25 MCG (1000 UNIT) PO TABS
1000.0000 [IU] | ORAL_TABLET | Freq: Every day | ORAL | Status: DC
  Administered 2017-06-29 (×2): 1000 [IU] via ORAL
  Filled 2017-06-28 (×2): qty 1

## 2017-06-28 MED ORDER — SODIUM CHLORIDE 0.9 % IV SOLN
INTRAVENOUS | Status: DC
  Administered 2017-06-28: 16:00:00 via INTRAVENOUS

## 2017-06-28 MED ORDER — FENTANYL CITRATE (PF) 100 MCG/2ML IJ SOLN
INTRAMUSCULAR | Status: AC
  Administered 2017-06-28: 50 ug via INTRAVENOUS
  Filled 2017-06-28: qty 2

## 2017-06-28 MED ORDER — ONDANSETRON HCL 4 MG/2ML IJ SOLN
INTRAMUSCULAR | Status: DC | PRN
  Administered 2017-06-28: 4 mg via INTRAVENOUS

## 2017-06-28 MED ORDER — TRAMADOL HCL 50 MG PO TABS: 50.0000 mg | ORAL_TABLET | Freq: Every evening | ORAL | Status: DC | PRN

## 2017-06-28 MED ORDER — RAMIPRIL 10 MG PO CAPS
10.0000 mg | ORAL_CAPSULE | Freq: Every day | ORAL | Status: DC
  Administered 2017-06-29: 10 mg via ORAL
  Filled 2017-06-28: qty 1

## 2017-06-28 MED ORDER — TRIAMCINOLONE ACETONIDE 0.1 % EX CREA
1.0000 "application " | TOPICAL_CREAM | Freq: Two times a day (BID) | CUTANEOUS | Status: DC | PRN
  Filled 2017-06-28: qty 15

## 2017-06-28 MED ORDER — ONDANSETRON HCL 4 MG/2ML IJ SOLN
INTRAMUSCULAR | Status: AC
  Filled 2017-06-28: qty 2

## 2017-06-28 MED ORDER — DIPHENHYDRAMINE-APAP (SLEEP) 25-500 MG PO TABS: 2.0000 | ORAL_TABLET | Freq: Every evening | ORAL | Status: DC | PRN

## 2017-06-28 MED ORDER — LIDOCAINE 2% (20 MG/ML) 5 ML SYRINGE
INTRAMUSCULAR | Status: AC
  Filled 2017-06-28: qty 5

## 2017-06-28 MED ORDER — SODIUM CHLORIDE 0.9 % IR SOLN
Status: DC | PRN
  Administered 2017-06-28: 1000 mL

## 2017-06-28 MED ORDER — METOCLOPRAMIDE HCL 5 MG PO TABS: 5.0000 mg | ORAL_TABLET | Freq: Three times a day (TID) | ORAL | Status: DC | PRN

## 2017-06-28 MED ORDER — BUPIVACAINE-EPINEPHRINE (PF) 0.5% -1:200000 IJ SOLN
INTRAMUSCULAR | Status: DC | PRN
  Administered 2017-06-28: 20 mL via PERINEURAL

## 2017-06-28 MED ORDER — FENTANYL CITRATE (PF) 100 MCG/2ML IJ SOLN
INTRAMUSCULAR | Status: DC | PRN
  Administered 2017-06-28: 50 ug via INTRAVENOUS

## 2017-06-28 MED ORDER — ROSUVASTATIN CALCIUM 10 MG PO TABS
40.0000 mg | ORAL_TABLET | Freq: Every day | ORAL | Status: DC
  Administered 2017-06-28: 40 mg via ORAL
  Filled 2017-06-28: qty 4

## 2017-06-28 MED ORDER — DEXAMETHASONE SODIUM PHOSPHATE 10 MG/ML IJ SOLN
INTRAMUSCULAR | Status: DC | PRN
  Administered 2017-06-28: 10 mg via INTRAVENOUS

## 2017-06-28 MED ORDER — MIDAZOLAM HCL 2 MG/2ML IJ SOLN
2.0000 mg | Freq: Once | INTRAMUSCULAR | Status: AC
  Administered 2017-06-28: 2 mg via INTRAVENOUS

## 2017-06-28 MED ORDER — BISACODYL 10 MG RE SUPP: 10.0000 mg | Freq: Every day | RECTAL | Status: DC | PRN

## 2017-06-28 MED ORDER — EPHEDRINE 5 MG/ML INJ
INTRAVENOUS | Status: AC
  Filled 2017-06-28: qty 10

## 2017-06-28 MED ORDER — CLINDAMYCIN PHOSPHATE 600 MG/50ML IV SOLN
600.0000 mg | Freq: Four times a day (QID) | INTRAVENOUS | Status: AC
  Administered 2017-06-28 – 2017-06-29 (×3): 600 mg via INTRAVENOUS
  Filled 2017-06-28 (×3): qty 50

## 2017-06-28 MED ORDER — DOCUSATE SODIUM 100 MG PO CAPS
100.0000 mg | ORAL_CAPSULE | Freq: Two times a day (BID) | ORAL | Status: DC
  Administered 2017-06-29: 100 mg via ORAL
  Filled 2017-06-28 (×2): qty 1

## 2017-06-28 MED ORDER — METHOCARBAMOL 1000 MG/10ML IJ SOLN
500.0000 mg | Freq: Four times a day (QID) | INTRAVENOUS | Status: DC | PRN
  Filled 2017-06-28: qty 5

## 2017-06-28 MED ORDER — METOPROLOL SUCCINATE ER 25 MG PO TB24
25.0000 mg | ORAL_TABLET | Freq: Two times a day (BID) | ORAL | Status: DC
  Administered 2017-06-28: 25 mg via ORAL
  Filled 2017-06-28 (×2): qty 1

## 2017-06-28 MED ORDER — NITROGLYCERIN 0.4 MG SL SUBL: 0.4000 mg | SUBLINGUAL_TABLET | SUBLINGUAL | Status: DC | PRN

## 2017-06-28 MED ORDER — CLINDAMYCIN PHOSPHATE 900 MG/50ML IV SOLN
900.0000 mg | INTRAVENOUS | Status: AC
  Administered 2017-06-28: 900 mg via INTRAVENOUS
  Filled 2017-06-28: qty 50

## 2017-06-28 MED ORDER — LEVOCETIRIZINE DIHYDROCHLORIDE 5 MG PO TABS: 5.0000 mg | ORAL_TABLET | Freq: Every day | ORAL | Status: DC

## 2017-06-28 MED ORDER — MIDAZOLAM HCL 2 MG/2ML IJ SOLN
INTRAMUSCULAR | Status: AC
  Administered 2017-06-28: 2 mg via INTRAVENOUS
  Filled 2017-06-28: qty 2

## 2017-06-28 MED ORDER — FENTANYL CITRATE (PF) 100 MCG/2ML IJ SOLN
50.0000 ug | Freq: Once | INTRAMUSCULAR | Status: AC
  Administered 2017-06-28: 50 ug via INTRAVENOUS

## 2017-06-28 MED ORDER — BUPIVACAINE-EPINEPHRINE 0.25% -1:200000 IJ SOLN
INTRAMUSCULAR | Status: DC | PRN
  Administered 2017-06-28: 10 mL

## 2017-06-28 MED ORDER — DEXAMETHASONE SODIUM PHOSPHATE 10 MG/ML IJ SOLN
INTRAMUSCULAR | Status: AC
  Filled 2017-06-28: qty 1

## 2017-06-28 MED ORDER — BUPIVACAINE-EPINEPHRINE 0.25% -1:200000 IJ SOLN
INTRAMUSCULAR | Status: AC
  Filled 2017-06-28: qty 1

## 2017-06-28 MED ORDER — POLYETHYLENE GLYCOL 3350 17 G PO PACK: 17.0000 g | PACK | Freq: Every day | ORAL | Status: DC | PRN

## 2017-06-28 MED ORDER — DIPHENHYDRAMINE HCL 25 MG PO CAPS: 50.0000 mg | ORAL_CAPSULE | Freq: Every evening | ORAL | Status: DC | PRN

## 2017-06-28 MED ORDER — LACTATED RINGERS IV SOLN
INTRAVENOUS | Status: DC
  Administered 2017-06-28: 12:00:00 via INTRAVENOUS

## 2017-06-28 MED ORDER — METHOCARBAMOL 500 MG PO TABS
500.0000 mg | ORAL_TABLET | Freq: Four times a day (QID) | ORAL | Status: DC | PRN
  Administered 2017-06-29: 500 mg via ORAL
  Filled 2017-06-28: qty 1

## 2017-06-28 MED ORDER — SUGAMMADEX SODIUM 200 MG/2ML IV SOLN
INTRAVENOUS | Status: AC
  Filled 2017-06-28: qty 2

## 2017-06-28 MED ORDER — PROPOFOL 10 MG/ML IV BOLUS
INTRAVENOUS | Status: AC
  Filled 2017-06-28: qty 20

## 2017-06-28 MED ORDER — MORPHINE SULFATE (PF) 4 MG/ML IV SOLN
2.0000 mg | INTRAVENOUS | Status: DC | PRN
  Administered 2017-06-29 (×2): 4 mg via INTRAVENOUS
  Filled 2017-06-28 (×2): qty 1

## 2017-06-28 MED ORDER — PANTOPRAZOLE SODIUM 40 MG PO TBEC
40.0000 mg | DELAYED_RELEASE_TABLET | Freq: Every day | ORAL | Status: DC
  Administered 2017-06-29: 40 mg via ORAL
  Filled 2017-06-28: qty 1

## 2017-06-28 MED ORDER — PHENYLEPHRINE HCL 10 MG/ML IJ SOLN
INTRAVENOUS | Status: DC | PRN
  Administered 2017-06-28: 10 ug/min via INTRAVENOUS

## 2017-06-28 MED ORDER — METOCLOPRAMIDE HCL 5 MG/ML IJ SOLN: 5.0000 mg | Freq: Three times a day (TID) | INTRAMUSCULAR | Status: DC | PRN

## 2017-06-28 MED ORDER — ASPIRIN EC 81 MG PO TBEC
81.0000 mg | DELAYED_RELEASE_TABLET | Freq: Every day | ORAL | Status: DC
  Administered 2017-06-29: 81 mg via ORAL
  Filled 2017-06-28: qty 1

## 2017-06-28 MED ORDER — ROCURONIUM BROMIDE 10 MG/ML (PF) SYRINGE
PREFILLED_SYRINGE | INTRAVENOUS | Status: AC
  Filled 2017-06-28: qty 5

## 2017-06-28 MED ORDER — OXYCODONE HCL 5 MG PO TABS
5.0000 mg | ORAL_TABLET | ORAL | Status: DC | PRN
  Administered 2017-06-29: 10 mg via ORAL
  Administered 2017-06-29: 5 mg via ORAL
  Administered 2017-06-29: 10 mg via ORAL
  Filled 2017-06-28 (×2): qty 2
  Filled 2017-06-28: qty 1

## 2017-06-28 MED ORDER — ACETAMINOPHEN 325 MG PO TABS: 650.0000 mg | ORAL_TABLET | Freq: Four times a day (QID) | ORAL | Status: DC | PRN

## 2017-06-28 SURGICAL SUPPLY — 64 items
BIT DRILL 170X2.5X (BIT) IMPLANT
BIT DRILL 5/64X5 DISP (BIT) ×1 IMPLANT
BIT DRL 170X2.5X (BIT)
BLADE SAG 18X100X1.27 (BLADE) ×2 IMPLANT
CAPT SHLDR REVTOTAL 1 ×1 IMPLANT
COVER SURGICAL LIGHT HANDLE (MISCELLANEOUS) ×2 IMPLANT
DRAPE INCISE IOBAN 66X45 STRL (DRAPES) ×1 IMPLANT
DRAPE ORTHO SPLIT 77X108 STRL (DRAPES) ×4
DRAPE SURG ORHT 6 SPLT 77X108 (DRAPES) ×2 IMPLANT
DRAPE U-SHAPE 47X51 STRL (DRAPES) ×2 IMPLANT
DRILL 2.5 (BIT)
DRSG ADAPTIC 3X8 NADH LF (GAUZE/BANDAGES/DRESSINGS) ×2 IMPLANT
DRSG PAD ABDOMINAL 8X10 ST (GAUZE/BANDAGES/DRESSINGS) ×1 IMPLANT
DURAPREP 26ML APPLICATOR (WOUND CARE) ×2 IMPLANT
ELECT BLADE 4.0 EZ CLEAN MEGAD (MISCELLANEOUS) ×2
ELECT NDL TIP 2.8 STRL (NEEDLE) ×1 IMPLANT
ELECT NEEDLE TIP 2.8 STRL (NEEDLE) ×2 IMPLANT
ELECT REM PT RETURN 9FT ADLT (ELECTROSURGICAL) ×2
ELECTRODE BLDE 4.0 EZ CLN MEGD (MISCELLANEOUS) ×1 IMPLANT
ELECTRODE REM PT RTRN 9FT ADLT (ELECTROSURGICAL) ×1 IMPLANT
GAUZE SPONGE 4X4 12PLY STRL (GAUZE/BANDAGES/DRESSINGS) ×2 IMPLANT
GLOVE BIOGEL PI ORTHO PRO 7.5 (GLOVE) ×1
GLOVE BIOGEL PI ORTHO PRO SZ8 (GLOVE) ×1
GLOVE ORTHO TXT STRL SZ7.5 (GLOVE) ×2 IMPLANT
GLOVE PI ORTHO PRO STRL 7.5 (GLOVE) ×1 IMPLANT
GLOVE PI ORTHO PRO STRL SZ8 (GLOVE) ×1 IMPLANT
GLOVE SURG ORTHO 8.5 STRL (GLOVE) ×2 IMPLANT
GOWN STRL REUS W/ TWL LRG LVL3 (GOWN DISPOSABLE) ×1 IMPLANT
GOWN STRL REUS W/ TWL XL LVL3 (GOWN DISPOSABLE) ×2 IMPLANT
GOWN STRL REUS W/TWL LRG LVL3 (GOWN DISPOSABLE) ×2
GOWN STRL REUS W/TWL XL LVL3 (GOWN DISPOSABLE) ×4
KIT BASIN OR (CUSTOM PROCEDURE TRAY) ×2 IMPLANT
KIT ROOM TURNOVER OR (KITS) ×2 IMPLANT
MANIFOLD NEPTUNE II (INSTRUMENTS) ×2 IMPLANT
NDL 1/2 CIR MAYO (NEEDLE) ×1 IMPLANT
NDL HYPO 25GX1X1/2 BEV (NEEDLE) ×1 IMPLANT
NEEDLE 1/2 CIR MAYO (NEEDLE) ×2 IMPLANT
NEEDLE HYPO 25GX1X1/2 BEV (NEEDLE) ×2 IMPLANT
NS IRRIG 1000ML POUR BTL (IV SOLUTION) ×2 IMPLANT
PACK SHOULDER (CUSTOM PROCEDURE TRAY) ×2 IMPLANT
PAD ABD 8X10 STRL (GAUZE/BANDAGES/DRESSINGS) ×1 IMPLANT
PAD ARMBOARD 7.5X6 YLW CONV (MISCELLANEOUS) ×4 IMPLANT
PIN GUIDE 1.2 (PIN) IMPLANT
PIN GUIDE GLENOPHERE 1.5MX300M (PIN) IMPLANT
PIN METAGLENE 2.5 (PIN) IMPLANT
SCREW LOCK DELTA XTEND 4.5X30 (Screw) ×1 IMPLANT
SPONGE LAP 18X18 X RAY DECT (DISPOSABLE) ×1 IMPLANT
SPONGE LAP 4X18 X RAY DECT (DISPOSABLE) ×1 IMPLANT
STRIP CLOSURE SKIN 1/2X4 (GAUZE/BANDAGES/DRESSINGS) ×2 IMPLANT
SUCTION FRAZIER HANDLE 10FR (MISCELLANEOUS) ×1
SUCTION TUBE FRAZIER 10FR DISP (MISCELLANEOUS) ×1 IMPLANT
SUT FIBERWIRE #2 38 T-5 BLUE (SUTURE) ×2
SUT MNCRL AB 4-0 PS2 18 (SUTURE) ×2 IMPLANT
SUT VIC AB 0 CT2 27 (SUTURE) ×2 IMPLANT
SUT VIC AB 2-0 CT1 27 (SUTURE) ×2
SUT VIC AB 2-0 CT1 TAPERPNT 27 (SUTURE) ×1 IMPLANT
SUT VICRYL 0 CT 1 36IN (SUTURE) ×2 IMPLANT
SUTURE FIBERWR #2 38 T-5 BLUE (SUTURE) IMPLANT
SYR BULB IRRIGATION 50ML (SYRINGE) ×2 IMPLANT
SYR CONTROL 10ML LL (SYRINGE) ×2 IMPLANT
TOWEL OR 17X24 6PK STRL BLUE (TOWEL DISPOSABLE) ×1 IMPLANT
TOWEL OR 17X26 10 PK STRL BLUE (TOWEL DISPOSABLE) ×1 IMPLANT
TOWER CARTRIDGE SMART MIX (DISPOSABLE) IMPLANT
YANKAUER SUCT BULB TIP NO VENT (SUCTIONS) ×2 IMPLANT

## 2017-06-28 NOTE — Anesthesia Procedure Notes (Signed)
Procedure Name: Intubation Date/Time: 06/28/2017 12:54 PM Performed by: Rush Farmer E Pre-anesthesia Checklist: Patient identified, Emergency Drugs available, Suction available and Patient being monitored Patient Re-evaluated:Patient Re-evaluated prior to induction Oxygen Delivery Method: Circle system utilized Preoxygenation: Pre-oxygenation with 100% oxygen Induction Type: IV induction Ventilation: Mask ventilation without difficulty Laryngoscope Size: Mac and 3 Grade View: Grade II Tube type: Oral Tube size: 7.0 mm Number of attempts: 1 Airway Equipment and Method: Stylet Placement Confirmation: ETT inserted through vocal cords under direct vision,  positive ETCO2 and breath sounds checked- equal and bilateral Secured at: 20 cm Tube secured with: Tape Dental Injury: Teeth and Oropharynx as per pre-operative assessment

## 2017-06-28 NOTE — Anesthesia Procedure Notes (Signed)
Anesthesia Regional Block: Interscalene brachial plexus block   Pre-Anesthetic Checklist: ,, timeout performed, Correct Patient, Correct Site, Correct Laterality, Correct Procedure, Correct Position, site marked, Risks and benefits discussed, pre-op evaluation,  At surgeon's request and post-op pain management  Laterality: Left  Prep: chloraprep       Needles:   Needle Type: Echogenic Needle     Needle Length: 9cm  Needle Gauge: 21     Additional Needles:   Procedures: ultrasound guided,,,,,,,,  Narrative:  Start time: 06/28/2017 12:05 PM End time: 06/28/2017 12:11 PM Injection made incrementally with aspirations every 5 mL. Anesthesiologist: Lyndle Herrlich

## 2017-06-28 NOTE — Brief Op Note (Signed)
06/28/2017  2:53 PM  PATIENT:  Natalie Hall  66 y.o. female  PRE-OPERATIVE DIAGNOSIS:  Left shoulder osteoarthritis, rotator cuff atrophy  POST-OPERATIVE DIAGNOSIS:  Left shoulder osteoarthritis, rotator cuff atrophy  PROCEDURE:  Procedure(s): LEFT SHOULDER REVERSE SHOULDER ARTHROPLASTY (Left) DePuy Delta Xtend  SURGEON:  Surgeon(s) and Role:    Netta Cedars, MD - Primary  PHYSICIAN ASSISTANT:   ASSISTANTS: Ventura Bruns, PA-C   ANESTHESIA:   regional and general  EBL:  Total I/O In: 700 [I.V.:700] Out: 150 [Blood:150]  BLOOD ADMINISTERED:none  DRAINS: none   LOCAL MEDICATIONS USED:  MARCAINE     SPECIMEN:  No Specimen  DISPOSITION OF SPECIMEN:  N/A  COUNTS:  YES  TOURNIQUET:  * No tourniquets in log *  DICTATION: .Other Dictation: Dictation Number [id number]  PLAN OF CARE: Admit to inpatient   PATIENT DISPOSITION:  PACU - hemodynamically stable.   Delay start of Pharmacological VTE agent (>24hrs) due to surgical blood loss or risk of bleeding: not applicable

## 2017-06-28 NOTE — Interval H&P Note (Signed)
History and Physical Interval Note:  06/28/2017 12:23 PM  Natalie Hall  has presented today for surgery, with the diagnosis of Left shoulder osteoarthritis, rotator cuff atrophy  The various methods of treatment have been discussed with the patient and family. After consideration of risks, benefits and other options for treatment, the patient has consented to  Procedure(s): LEFT SHOULDER REVERSE SHOULDER ARTHROPLASTY (Left) as a surgical intervention .  The patient's history has been reviewed, patient examined, no change in status, stable for surgery.  I have reviewed the patient's chart and labs.  Questions were answered to the patient's satisfaction.     Omega Durante,STEVEN R

## 2017-06-28 NOTE — Anesthesia Postprocedure Evaluation (Signed)
Anesthesia Post Note  Patient: Natalie Hall  Procedure(s) Performed: Procedure(s) (LRB): LEFT SHOULDER REVERSE SHOULDER ARTHROPLASTY (Left)     Patient location during evaluation: PACU Anesthesia Type: General Level of consciousness: awake and alert Pain management: pain level controlled Vital Signs Assessment: post-procedure vital signs reviewed and stable Respiratory status: spontaneous breathing, nonlabored ventilation, respiratory function stable and patient connected to nasal cannula oxygen Cardiovascular status: blood pressure returned to baseline and stable Postop Assessment: no signs of nausea or vomiting Anesthetic complications: no    Last Vitals:  Vitals:   06/28/17 1520 06/28/17 1535  BP: 115/67 120/62  Pulse: 75 72  Resp: 17 16  Temp:  36.5 C  SpO2: 98% 99%    Last Pain:  Vitals:   06/28/17 1535  TempSrc:   PainSc: 0-No pain                 Brailyn Killion EDWARD

## 2017-06-28 NOTE — Anesthesia Preprocedure Evaluation (Addendum)
Anesthesia Evaluation  Patient identified by MRN, date of birth, ID band Patient awake    Reviewed: Allergy & Precautions, H&P , NPO status , Patient's Chart, lab work & pertinent test results, reviewed documented beta blocker date and time   Airway Mallampati: II  TM Distance: >3 FB Neck ROM: full    Dental no notable dental hx. 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  Reproductive/Obstetrics                           Anesthesia Physical Anesthesia Plan  ASA: III  Anesthesia Plan: General   Post-op Pain Management:  Regional for Post-op pain   Induction: Intravenous  PONV Risk Score and Plan: 2 and Ondansetron, Dexamethasone and Midazolam  Airway Management Planned: Oral ETT  Additional Equipment:   Intra-op Plan:   Post-operative Plan: Extubation in OR  Informed Consent: I have reviewed the patients History and Physical, chart, labs and discussed the procedure including the risks, benefits and alternatives for the proposed anesthesia with the patient or authorized representative who has indicated his/her understanding and acceptance.   Dental Advisory Given  Plan Discussed with: CRNA and Surgeon  Anesthesia Plan Comments: (  )       Anesthesia Quick Evaluation

## 2017-06-28 NOTE — Transfer of Care (Signed)
Immediate Anesthesia Transfer of Care Note  Patient: Natalie Hall  Procedure(s) Performed: Procedure(s): LEFT SHOULDER REVERSE SHOULDER ARTHROPLASTY (Left)  Patient Location: PACU  Anesthesia Type:General  Level of Consciousness: awake, alert  and patient cooperative  Airway & Oxygen Therapy: Patient Spontanous Breathing and Patient connected to nasal cannula oxygen  Post-op Assessment: Report given to RN, Post -op Vital signs reviewed and stable and Patient moving all extremities X 4  Post vital signs: Reviewed and stable  Last Vitals:  Vitals:   06/28/17 1220 06/28/17 1450  BP: (!) 114/37 125/68  Pulse: 80 84  Resp: 16 16  Temp:  36.6 C  SpO2: 99% 98%    Last Pain:  Vitals:   06/28/17 1450  TempSrc:   PainSc: (P) 0-No pain      Patients Stated Pain Goal: 3 (81/10/31 5945)  Complications: No apparent anesthesia complications

## 2017-06-29 LAB — BASIC METABOLIC PANEL
Anion gap: 9 (ref 5–15)
BUN: 11 mg/dL (ref 6–20)
CALCIUM: 8.4 mg/dL — AB (ref 8.9–10.3)
CHLORIDE: 106 mmol/L (ref 101–111)
CO2: 20 mmol/L — AB (ref 22–32)
CREATININE: 0.65 mg/dL (ref 0.44–1.00)
GFR calc Af Amer: >60 mL/min (ref >60)
GLUCOSE: 159 mg/dL — AB (ref 65–99)
Potassium: 3.9 mmol/L (ref 3.5–5.1)
Sodium: 135 mmol/L (ref 135–145)

## 2017-06-29 LAB — HEMOGLOBIN AND HEMATOCRIT, BLOOD
HEMATOCRIT: 35.2 % — AB (ref 36.0–46.0)
HEMOGLOBIN: 11.6 g/dL — AB (ref 12.0–15.0)

## 2017-06-29 MED ORDER — OXYCODONE-ACETAMINOPHEN 5-325 MG PO TABS: 0.5000 | ORAL_TABLET | ORAL | 0 refills | Status: DC | PRN

## 2017-06-29 MED ORDER — ONDANSETRON HCL 4 MG PO TABS: 4.0000 mg | ORAL_TABLET | Freq: Three times a day (TID) | ORAL | 0 refills | Status: DC | PRN

## 2017-06-29 MED ORDER — METHOCARBAMOL 500 MG PO TABS: 500.0000 mg | ORAL_TABLET | Freq: Three times a day (TID) | ORAL | 0 refills | Status: DC | PRN

## 2017-06-29 NOTE — Discharge Instructions (Signed)
Minimal weight bearing with the left UE.  Keep the incision covered and clean and dry for one week, then ok to get it wet in the shower.  Do not push up with weight out of the chair.  Do not reach behind the back.  Use the sling while up and walking around.  Do exercises as instructed 4-5 times per day for 5-10 minutes.  Follow up with Dr [name] in the office in two weeks, [phone]

## 2017-06-29 NOTE — Therapy (Signed)
Occupational Therapy Evaluation Patient Details Name: Natalie Hall MRN: [id number] DOB: Jan 13, 1951 Today's Date: 06/29/2017    History of Present Illness Pt is a 66 y.o. female s/p left shoudler reverse shoulder arthroplasty. PMH includes choroidal nevus of left eye, GERD, HTN, osteoarthritis, supreventricular tachycardia.    Clinical Impression   Pt reports requiring min assist for UB dressing but independent with all other ADLs PTA. Currently pt requires min assist for all ADLs and functional mobility with the exception of min guard for grooming, toilet transfers, and peri care. Pt limited this session due to nerve block and feeling drowsy. Pt reports that family is available to provided 24 hour assistance as needed upon d/c. OT will follow acutely to address established goals.     Follow Up Recommendations  DC plan and follow up therapy as arranged by surgeon;Supervision/Assistance - 24 hour    Equipment Recommendations  None recommended by OT    Recommendations for Other Services       Precautions / Restrictions Precautions Precautions: Shoulder Type of Shoulder Precautions: Conservative- Lap slides, AROM wrist, hand, elbow OK. NO AROM of shoulder, NO PROM of shoulder. Shoulder Interventions: Shoulder sling/immobilizer;At all times;Off for dressing/bathing/exercises Precaution Booklet Issued: Yes (comment) Required Braces or Orthoses: Sling Restrictions Weight Bearing Restrictions: Yes LUE Weight Bearing: Non weight bearing      Mobility Bed Mobility Overal bed mobility: Needs Assistance Bed Mobility: Sit to Supine;Supine to Sit     Supine to sit: Supervision Sit to supine: Supervision   General bed mobility comments: Supervision for safety   Transfers Overall transfer level: Needs assistance Equipment used: 1 person hand held assist Transfers: Sit to/from Stand Sit to Stand: Min assist         General transfer comment: MinA for safety.    Balance Overall  balance assessment: Needs assistance Sitting-balance support: No upper extremity supported;Feet supported Sitting balance-Leahy Scale: Fair     Standing balance support: Single extremity supported;During functional activity Standing balance-Leahy Scale: Poor Standing balance comment: Single extremity support during functional mobility.                           ADL either performed or assessed with clinical judgement   ADL Overall ADL's : Needs assistance/impaired     Grooming: Min guard;Sitting   Upper Body Bathing: Minimal assistance;Sitting   Lower Body Bathing: Minimal assistance;Sitting/lateral leans   Upper Body Dressing : Minimal assistance;Sitting   Lower Body Dressing: Minimal assistance;Sitting/lateral leans   Toilet Transfer: Min guard;Ambulation;Comfort height toilet   Toileting- Clothing Manipulation and Hygiene: Min guard;Sit to/from stand   Tub/ Shower Transfer: Minimal assistance;Ambulation;Grab bars   Functional mobility during ADLs: Minimal assistance General ADL Comments: Pt educated on compensatory techniques for completing ADLs with shoulder precautions.      Vision         Perception     Praxis      Pertinent Vitals/Pain Pain Assessment: No/denies pain     Hand Dominance Right   Extremity/Trunk Assessment Upper Extremity Assessment Upper Extremity Assessment: LUE deficits/detail LUE Deficits / Details: Conservative shoulder protocol LUE: Unable to fully assess due to immobilization   Lower Extremity Assessment Lower Extremity Assessment: Overall WFL for tasks assessed   Cervical / Trunk Assessment Cervical / Trunk Assessment: Normal   Communication Communication Communication: No difficulties   Cognition Arousal/Alertness: Lethargic;Suspect due to medications Behavior During Therapy: [facility] for tasks assessed/performed Overall Cognitive Status: Within Functional Limits for tasks assessed  General Comments  Educated pt on conservative precautions, weight bearing restrictions and sling wear schedule. Pt also educated on compensatory techniques for completing ADLs and conservative protocol UE exercises for the wrist, hand and elbow. Pt reports nerve block has no completly wore off and feels a little drowsy.    Exercises Exercises: Shoulder Shoulder Exercises Elbow Flexion: AROM;10 reps;Seated Elbow Extension: AROM;10 reps;Seated Wrist Flexion: AROM;10 reps;Seated Wrist Extension: AROM;10 reps;Seated Digit Composite Flexion: AROM;10 reps;Seated Composite Extension: AROM;10 reps;Seated   Shoulder Instructions Shoulder Instructions Donning/doffing shirt without moving shoulder: Minimal assistance Method for sponge bathing under operated UE: Minimal assistance Donning/doffing sling/immobilizer: Minimal assistance;Patient able to independently direct caregiver Correct positioning of sling/immobilizer: Minimal assistance;Patient able to independently direct caregiver ROM for elbow, wrist and digits of operated UE: Min-guard Sling wearing schedule (on at all times/off for ADL's): Supervision/safety Proper positioning of operated UE when showering: Minimal assistance Positioning of UE while sleeping: [place] expects to be discharged to:: Private residence Living Arrangements: Spouse/significant other Available Help at Discharge: Family Type of Home: House Home Access: Stairs to enter Technical brewer of Steps: 4 Entrance Stairs-Rails: Left Home Layout: One level     Bathroom Shower/Tub: Occupational psychologist: Handicapped height Bathroom Accessibility: Yes How Accessible: Accessible via [place]: Grab bars - tub/shower          Prior Functioning/Environment Level of Independence: Independent with assistive device(s)        Comments: Pt reports husband was helping dress her UB         OT Problem List: Decreased strength;Decreased range of motion;Impaired balance (sitting and/or standing);Decreased safety awareness;Decreased knowledge of precautions;Pain;Impaired UE functional use      OT Treatment/Interventions: Self-care/ADL training;Therapeutic activities;Patient/family education;Balance training    OT Goals(Current goals can be found in the care plan section) Acute Rehab OT Goals Patient Stated Goal: To feel better OT Goal Formulation: With patient Time For Goal Achievement: 07/13/17 Potential to Achieve Goals: Good ADL Goals Pt Will Perform Upper Body Bathing: with modified independence;sitting Pt Will Perform Upper Body Dressing: with modified independence;sitting Pt/caregiver will Perform Home Exercise Program: Left upper extremity;With Supervision;With written HEP provided (AROM wrist, hand, elbow, lap slides. )  OT Frequency: Min 2X/week   Barriers to D/C:            Co-evaluation              AM-PAC PT "6 Clicks" Daily Activity     Outcome Measure Help from another person eating meals?: None Help from another person taking care of personal grooming?: A Little Help from another person toileting, which includes using toliet, bedpan, or urinal?: A Little Help from another person bathing (including washing, rinsing, drying)?: A Little Help from another person to put on and taking off regular upper body clothing?: A Little Help from another person to put on and taking off regular lower body clothing?: A Little 6 Click Score: 19   End of Session Equipment Utilized During Treatment: Gait belt (Sling) Nurse Communication: Mobility status  Activity Tolerance: Patient tolerated treatment well Patient left: in bed;with call bell/phone within reach;with bed alarm set  OT Visit Diagnosis: Pain;Muscle weakness (generalized) (M62.81);Unsteadiness on feet (R26.81) Pain - Right/Left: Left Pain - part of body: Shoulder                Time: 6222-9798 OT  Time Calculation (min): 42 min Charges:  OT General Charges $OT Visit: 1 Visit OT Evaluation $OT  Eval Moderate Complexity: 1 Mod OT Treatments $Self Care/Home Management : 8-22 mins $Therapeutic Exercise: 8-22 mins G-Codes:     Boykin Peek, OTS [phone]  Boykin Peek 06/29/2017, 10:31 AM

## 2017-06-29 NOTE — Progress Notes (Addendum)
Subjective: 1 Day Post-Op Procedure(s) (LRB): LEFT SHOULDER REVERSE SHOULDER ARTHROPLASTY (Left)  Patient reports pain as mild.  Reports that her block has not worn off and that her pain is well controlled.  Tolerating POs well. Denies fever, chills,  N/V, CP, SOB.  Objective:   VITALS:  Temp:  [97.7 F (36.5 C)-98.5 F (36.9 C)] 97.9 F (36.6 C) (09/08 0719) Pulse Rate:  [68-84] 72 (09/08 0719) Resp:  [12-19] 18 (09/07 1600) BP: (104-142)/(37-68) 104/46 (09/08 0719) SpO2:  [95 %-100 %] 96 % (09/08 0719) Weight:  [92.5 kg (204 lb)] 92.5 kg (204 lb) (09/07 1139)  General: WDWN patient in NAD. Psych:  Appropriate mood and affect. Neuro:  A&O x 3, Moving all extremities, sensation intact to light touch HEENT:  EOMs intact Chest:  Even non-labored respirations Skin: Dressing C/D/I, no rashes or lesions Extremities: warm/dry, mild edema, no erythema or echymosis.  No lymphadenopathy. Pulses: Radial  2+ MSK:  ROM: full wrist ROM, MMT: 5/5 grip strength    LABS  Recent Labs  06/29/17 0326  HGB 11.6*    Recent Labs  06/29/17 0326  NA 135  K 3.9  CL 106  CO2 20*  BUN 11  CREATININE 0.65  GLUCOSE 159*   No results for input(s): LABPT, INR in the last 72 hours.   Assessment/Plan: 1 Day Post-Op Procedure(s) (LRB): LEFT SHOULDER REVERSE SHOULDER ARTHROPLASTY (Left)  Patient seen in rounds for Dr. [name] D/C home today after working with therapy. Sling while up and walking. Minimal WB L UE, do not reach behind back. Plan on 2 week outpatient post-op visit with Dr. [name]. After performing a query on the [facility] Controlled Substance Reporting site I have provided the patient a script for oxycodone. Scripts on chart.  Mechele Claude, PA-C [facility] Office:  [phone]

## 2017-06-29 NOTE — Discharge Summary (Signed)
Orthopedics Discharge Summary  Admission Dx: Left shoulder OA  Discharge Dx: same  Procedure performed: Left shoulder replacement, 06/28/2017  Consulting service: OT  Hospital Course: Patient admitted to [facility] for the diagnosis of left shoulder OA and pain underwent successful shoulder arthroplasty on 9/7 and had an uncomplicated hospital course.  She was stable for discharge home on 9/8.  Vitals:   06/28/17 2154 06/29/17 0719  BP: (!) 121/54 (!) 104/46  Pulse: 82 72  Resp:    Temp: 98.5 F (36.9 C) 97.9 F (36.6 C)  SpO2: 95% 96%      Lab Results  Component Value Date   WBC 4.7 06/25/2017   HGB 11.6 (L) 06/29/2017   HCT 35.2 (L) 06/29/2017   MCV 87.0 06/25/2017   PLT 191 06/25/2017       Component Value Date/Time   NA 135 06/29/2017 0326   K 3.9 06/29/2017 0326   CL 106 06/29/2017 0326   CO2 20 (L) 06/29/2017 0326   GLUCOSE 159 (H) 06/29/2017 0326   BUN 11 06/29/2017 0326   CREATININE 0.65 06/29/2017 0326   CREATININE 0.59 10/01/2016 0939   CALCIUM 8.4 (L) 06/29/2017 0326   GFRNONAA >60 06/29/2017 0326   GFRAA >60 06/29/2017 0326    Lab Results  Component Value Date   INR 0.92 03/11/2016   INR 0.97 12/15/2013   D/C Meds: Percocet 5/325 1/2 to 1 tablet po every 4-6 hours as needed for pain Robaxin: 500 mg po every 6 hours as needed for spasms Zofran 4 mg po every 6 hours as needed for nausea  Assessment/Plan: POD #1 s/p Procedure(s): LEFT SHOULDER REVERSE SHOULDER ARTHROPLASTY D/C home with home exercises Follow up with Dr [name] in two weeks in the office  Remo Lipps R. [name], MD 06/29/2017 9:07 AM

## 2017-06-29 NOTE — Progress Notes (Signed)
Pt refused ted hose but continues to wear SCD's.  Pt reminded to continue using incentive spirometer while awake 10 repetitions every hour.  Pt hand is starting to regain feeling.  Pain block said to last 18 hours which will end around 7am.  Pt will be covered with pain medication to aid in controlling pain when block wears off.  Will continue to monitor for safety.

## 2017-06-29 NOTE — Plan of Care (Signed)
Problem: Safety: Goal: Ability to remain free from injury will improve Outcome: Progressing Pt has remained free from falls and injury. Will continue to monitor for safety.

## 2017-07-01 ENCOUNTER — Encounter (HOSPITAL_COMMUNITY): Payer: Self-pay | Admitting: Orthopedic Surgery

## 2017-07-01 NOTE — Op Note (Signed)
Natalie Hall, CID                 ACCOUNT NO.:  [id number]  MEDICAL RECORD NO.:  [id number]  LOCATION:                                 FACILITY:  PHYSICIAN:  Doran Heater. Natalie Hall, [name]      DATE OF BIRTH:  DATE OF PROCEDURE:  06/28/2017 DATE OF DISCHARGE:                              OPERATIVE REPORT   PREOPERATIVE DIAGNOSIS:  Left shoulder rotator cuff tear arthropathy.  POSTOPERATIVE DIAGNOSIS:  Left shoulder rotator cuff tear arthropathy.  PROCEDURE PERFORMED:  Left shoulder reverse total shoulder arthroplasty using DePuy Delta Xtend prosthesis.  ATTENDING SURGEON:  Doran Heater. Natalie Fells, MD.  ASSISTANT:  Charletta Cousin Dixon, [facility], who scrubbed the entire procedure and necessary for satisfactory completion of surgery.  ANESTHESIA:  General anesthesia was used plus interscalene block.  ESTIMATED BLOOD LOSS:  Less than 200 mL.  FLUID REPLACEMENT:  1500 mL crystalloid.  INSTRUMENT COUNTS:  Correct.  COMPLICATIONS:  There were no complications.  Perioperative antibiotics were given.  INDICATIONS:  The patient is a 66 year old female with progressively worsening left shoulder pain and dysfunction secondary to severe end- stage arthritis with rotator cuff insufficiency.  The patient has had profound loss of range of motion and function as well as increasing pain and desires operative treatment to restore function and eliminate pain. Informed consent was obtained.  DESCRIPTION OF PROCEDURE:  After an adequate level of anesthesia was achieved, the patient was positioned in modified beach-chair position. Left shoulder correctly identified, sterilely prepped and draped in usual manner.  Time-out called.  We entered the shoulder in standard deltopectoral approach, starting at the coracoid process, extending down to the anterior humerus.  Dissection down through subcutaneous tissues using Bovie electrocautery.  Cephalic vein identified and taken laterally with the deltoid, pectoralis  taken medially.  Conjoint tendon identified and retracted medially.  We tenodesed the biceps in situ by suturing into the pec tendon more distally in the bicipital groove.  We did a figure-of-eight x2.  Next, we went ahead and released the subscapularis subperiosteally off the lesser tuberosity and tagged for repair at the end, progressive release of the capsule off the inferior humerus, progressively externally rotating.  We then went ahead and released the biceps tendon and the anterior supraspinatus and infraspinatus.  Next, we extended the shoulder and externally rotated. We were able to deliver the humerus out of the wound.  We entered the proximal humerus with a 6-mm reamer, reamed up to size 12.  We then cut the humeral head 10 degrees in retroversion off the intramedullary guide.  Next, we removed excess osteophytes off the humerus.  There were multiple large loose bodies that were recovered.  Some measured greater than a centimeter in diameter.  We did a finger sweep to make sure we did not have any others in the subscapularis recess and posteriorly in the posterior capsule of the inferior pouch.  Once we had our exposure on the humeral side and removed all the osteophytes, we went ahead and prepared the metaphysis with the metaphyseal reamer for the epi-1 left. Once we had that metaphyseal preparation done, we impacted the 12 stem epi-1 left, set on 0 setting and  placed in 10 degrees of retroversion. We then retracted the humerus posteriorly.  We then did a 360-degree capsule labral excision to protect the axillary nerve.  Once we had 360- degree exposure of the glenoid face, which had no cartilage noted at all and was bone-on-bone, eburnated bone in fact.  We found our center point for the base plate and drilled our guide pin.  We then reamed for the metaglene or base plate and then drilled our central peg hole.  We did our peripheral hand reaming as well prior to removing the  guide pin and then once we had our central peg hole drilled, we impacted the metaglene in position.  We then placed a 42 screw inferiorly, 36 superiorly at the base coracoid, 24 locked anteriorly and an 18 nonlocked posteriorly.  We had excellent metaglene or base plate fixation and stability.  We then placed a 38 standard metaglene, secured into position and screwed that home and impacted and screwed it all the way down.  We had nice coverage and with the shape of the scapula, no impingement issues.  We irrigated thoroughly.  We then placed a 38+ 3 trial poly on the humeral side and reduced the shoulder, nice little pop.  We had good stability.  Conjoint tendon nice and tight.  Negative sulcus, negative gapping with external rotation.  We removed the trial components from the humeral side, irrigated thoroughly and then used impaction grafting technique with available bone graft from the humeral head to impact the HA-coated press- fit stem, 12 stem and epi-1 left, again set on the 0 setting and placed in 10 degrees of retroversion, impacted that in position.  We had great stability with that implant.  We then selected the real 38, +3 poly, impacted that in place and reduced the shoulder very stable.  Again, no gapping with external rotation, inferior pole and the conjoint tendon nice and tight.  Axillary nerve checked and not under too much tension. We then irrigated thoroughly and then we went ahead and after irrigating, we repaired the subscapularis anatomically back to bone with about 6 simple #2 FiberWire sutures.  Once we had that done, we irrigated again and deltopectoral closure with 0 Vicryl followed by 2-0 Vicryl subcutaneous closure and 4-0 Monocryl for skin and Steri-Strips applied followed by sterile dressing.  The patient tolerated the surgery well.     Doran Heater. Natalie Hall, [name]   ______________________________ Doran Heater. Natalie Hall, [name]    SRN/MEDQ  D:  06/28/2017  T:   06/29/2017  Job:  [id number]

## 2017-07-11 ENCOUNTER — Encounter: Payer: Self-pay | Admitting: Family Medicine

## 2017-07-23 ENCOUNTER — Ambulatory Visit (INDEPENDENT_AMBULATORY_CARE_PROVIDER_SITE_OTHER): Payer: Medicare Other

## 2017-07-23 DIAGNOSIS — Z23 Encounter for immunization: Secondary | ICD-10-CM | POA: Diagnosis not present

## 2017-07-29 ENCOUNTER — Other Ambulatory Visit: Payer: Self-pay | Admitting: Internal Medicine

## 2017-07-29 DIAGNOSIS — I10 Essential (primary) hypertension: Secondary | ICD-10-CM

## 2017-07-29 IMAGING — DX DG CHEST 2V
2 series · 2 of 2 positions shown · non-contrast
Comparison: 12/15/2013

CLINICAL DATA: Supraventricular tachycardia.

EXAM:
CHEST  2 VIEW

[w chest pa]
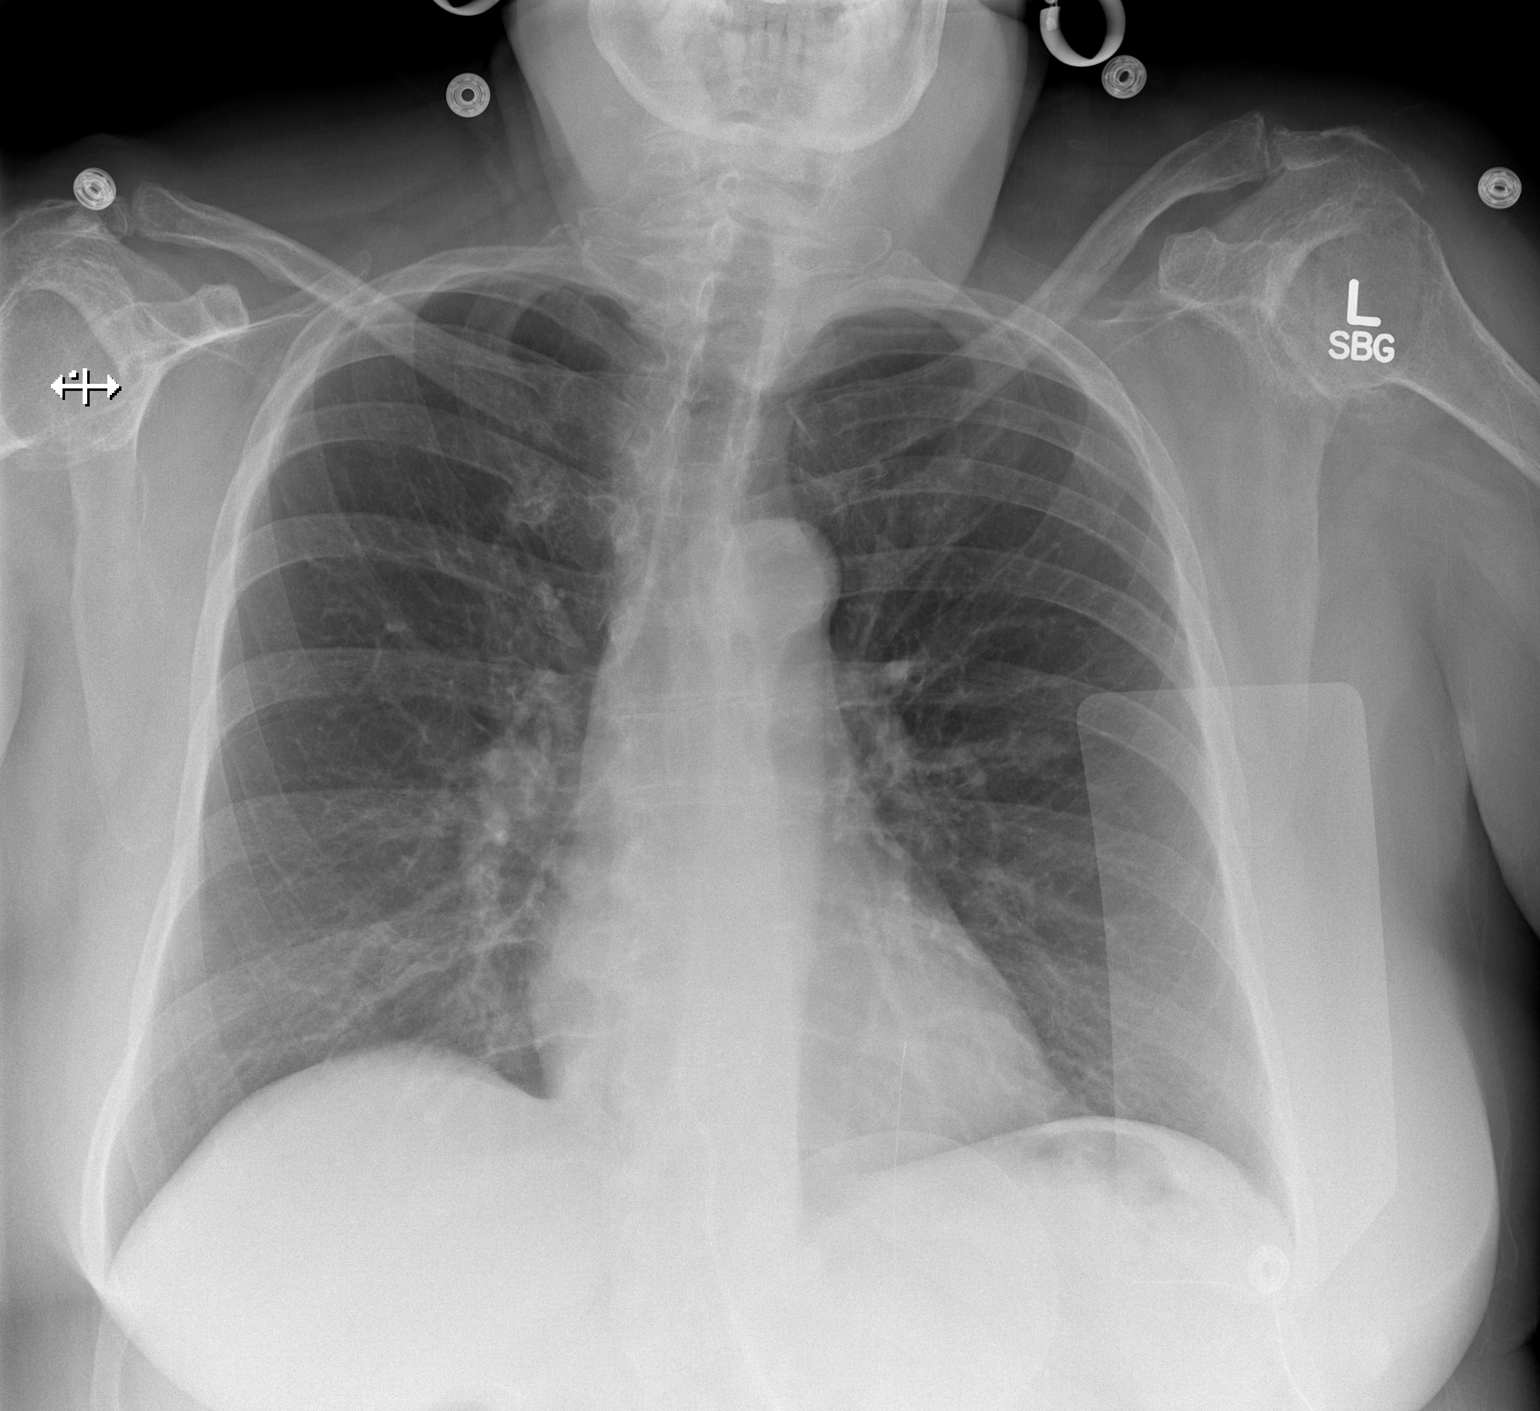

[w chest lat]
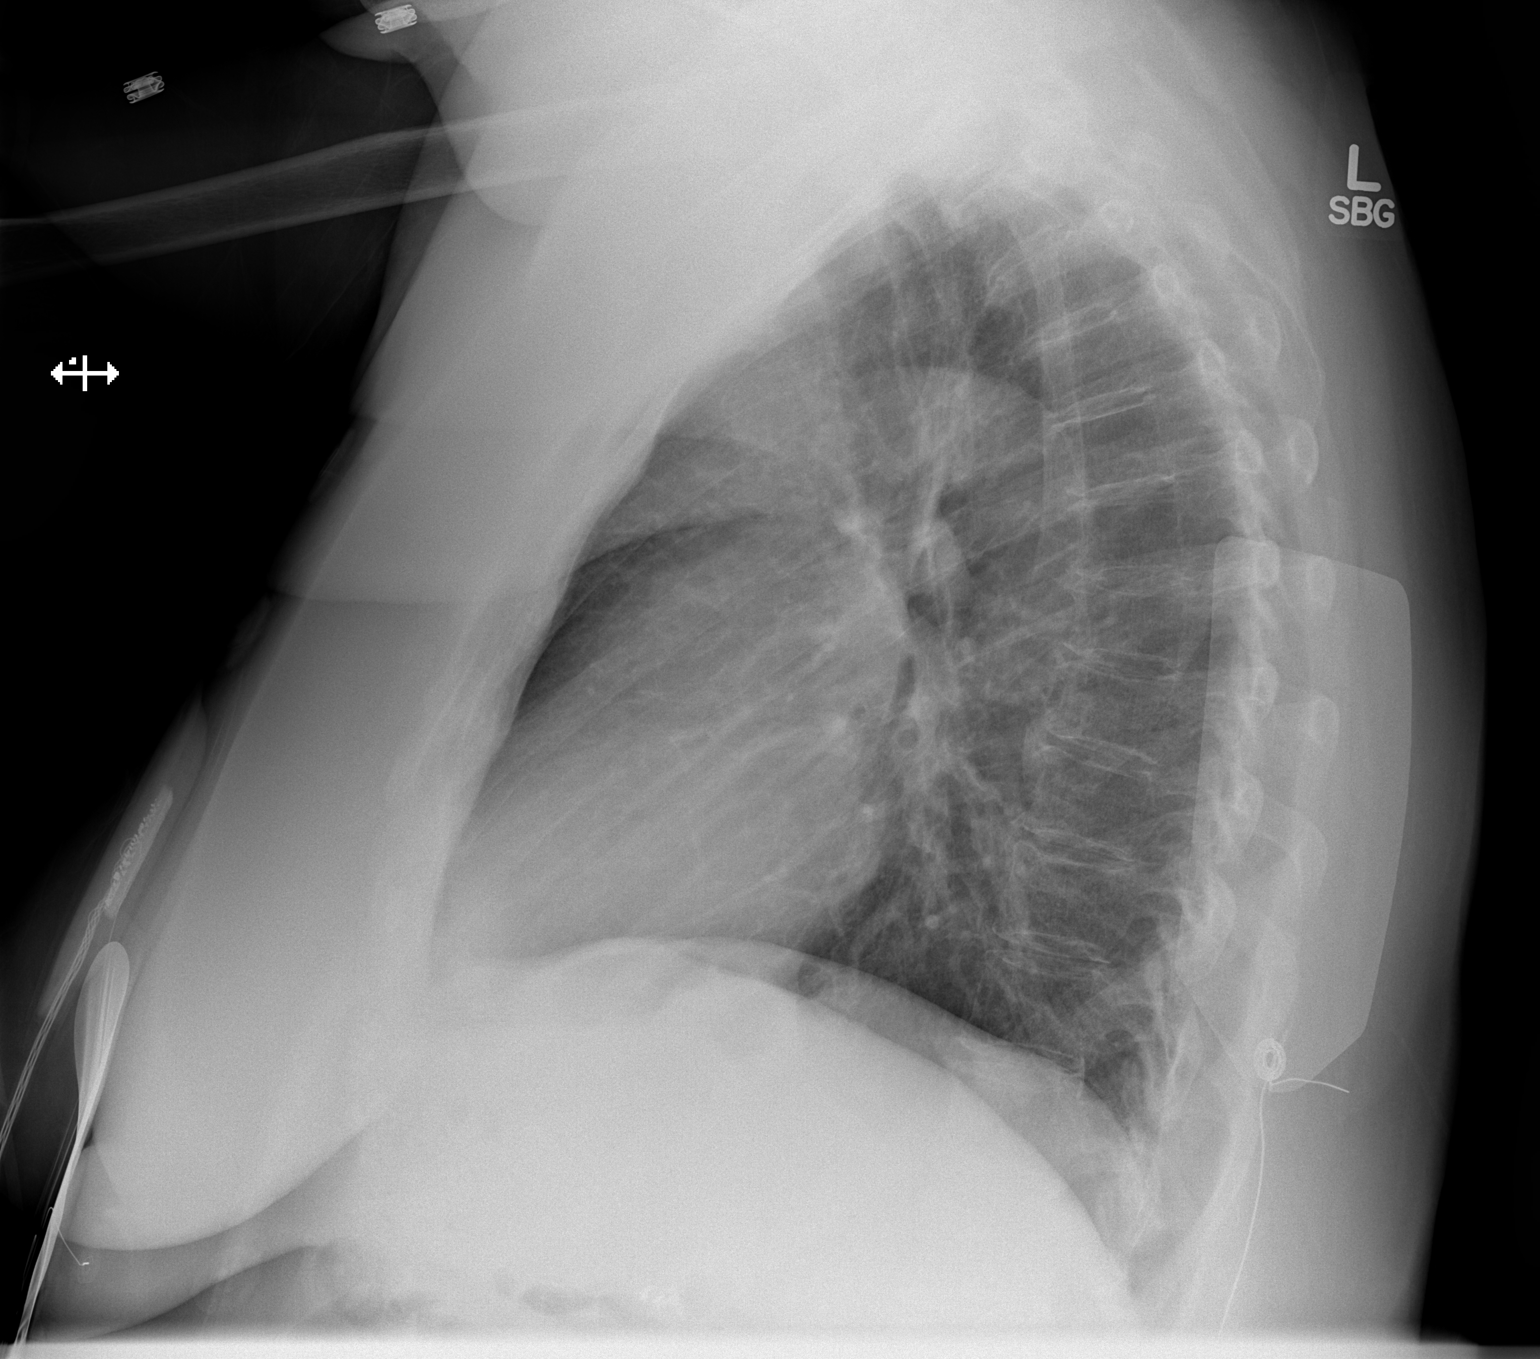

[2 of 2 positions shown; findings below may reference images not displayed]

FINDINGS: Normal heart size and mediastinal contours. No acute infiltrate or
edema. No effusion or pneumothorax. No acute osseous findings.
IMPRESSION: No active cardiopulmonary disease.

## 2017-08-22 ENCOUNTER — Encounter: Payer: Self-pay | Admitting: Family Medicine

## 2017-08-23 ENCOUNTER — Encounter: Payer: Self-pay | Admitting: *Deleted

## 2017-08-23 DIAGNOSIS — Z006 Encounter for examination for normal comparison and control in clinical research program: Secondary | ICD-10-CM

## 2017-08-23 NOTE — Progress Notes (Signed)
TWILIGHT Research study 18 month follow up visit completed. She continues ASA 81 mg daily and not having any issues. I thanked her for participating in the study.

## 2017-08-27 NOTE — Telephone Encounter (Signed)
The rx is written

## 2017-09-28 ENCOUNTER — Other Ambulatory Visit: Payer: Self-pay | Admitting: Family Medicine

## 2017-10-02 LAB — LIPID PANEL
CHOLESTEROL TOTAL: 146 mg/dL (ref 100–199)
Chol/HDL Ratio: 4.6 ratio — ABNORMAL HIGH (ref 0.0–4.4)
HDL: 32 mg/dL — ABNORMAL LOW (ref >39)
LDL CALC: 75 mg/dL (ref 0–99)
Triglycerides: 196 mg/dL — ABNORMAL HIGH (ref 0–149)
VLDL Cholesterol Cal: 39 mg/dL (ref 5–40)

## 2017-12-27 ENCOUNTER — Other Ambulatory Visit: Payer: Self-pay | Admitting: Internal Medicine

## 2017-12-27 DIAGNOSIS — I10 Essential (primary) hypertension: Secondary | ICD-10-CM

## 2017-12-27 NOTE — Telephone Encounter (Signed)
REFILL 

## 2017-12-30 ENCOUNTER — Encounter: Payer: Self-pay | Admitting: Internal Medicine

## 2017-12-30 ENCOUNTER — Ambulatory Visit (INDEPENDENT_AMBULATORY_CARE_PROVIDER_SITE_OTHER): Payer: Medicare Other | Admitting: Internal Medicine

## 2017-12-30 VITALS — BP 142/74 | HR 78 | Ht 64.5 in | Wt 214.8 lb

## 2017-12-30 DIAGNOSIS — E785 Hyperlipidemia, unspecified: Secondary | ICD-10-CM | POA: Diagnosis not present

## 2017-12-30 DIAGNOSIS — E668 Other obesity: Secondary | ICD-10-CM | POA: Diagnosis not present

## 2017-12-30 DIAGNOSIS — I10 Essential (primary) hypertension: Secondary | ICD-10-CM

## 2017-12-30 DIAGNOSIS — I2510 Atherosclerotic heart disease of native coronary artery without angina pectoris: Secondary | ICD-10-CM | POA: Diagnosis not present

## 2017-12-30 DIAGNOSIS — Z6831 Body mass index (BMI) 31.0-31.9, adult: Secondary | ICD-10-CM

## 2017-12-30 DIAGNOSIS — E669 Obesity, unspecified: Secondary | ICD-10-CM

## 2017-12-30 NOTE — Progress Notes (Signed)
OFFICE NOTE  Chief Complaint:  Routine follow-up  Primary Care Physician: Laurey Morale, MD  HPI:  Natalie Hall is a 67 y.o. female who I last saw in the hospital a few weeks ago. She was admitted for an SVT which spontaneously converted in the ER on Cardizem and did not return. Unfortunately she had a non-ST elevation MI with troponin peak of 1.64. She underwent a Lexiscan Myoview which demonstrated a moderate region of reversibility in the mid and apical segments of the inferior lateral Deiss consistent with ischemia. EF was 61%. She then was referred to cardiac catheterization which demonstrated the following:  Cardiac Cath 05/24   Prox RCA lesion, 25% stenosed.  The left ventricular systolic function is normal.  Prox LAD to Mid LAD lesion, 15% stenosed.  Mid RCA-1 lesion, 95% stenosed.  Mid RCA-2 lesion, 95% stenosed. Post intervention, there is a 0% residual stenosis.SYNERGY DES 3X32 1. Single vessel obstructive CAD 2. Normal LV function 3. Successful stenting of the mid to distal RCA with a DES, SYNERGY DES 3X32 Plan: DAPT for one year. Patient may be a candidate for the TWILIGHT trial in which case study protocol will be followed. Anticipate DC in am.  [name] returns today and is doing quite well. She denies any chest pain or worsening shortness of breath. She is compliant with her medications. She did enroll in the TWILIGHT trial. Unfortunately, she was considering shoulder surgery but this will be delayed due to the necessity of dual antiplatelet therapy with her recent stent. I advised her the Tylenol or her tramadol would be safe medicines used for pain.  07/13/2016  Since I last saw her, she was admitted to the hospital for adenosine-responsive AVNRT. She underwent successful catheter ablation and has not had recurrence. She denies any chest pain or dyspnea. She remains in the TWILIGHT trial with Brillinta. Recent lipid profilw shows LDL remains not at goal at  104. HDL low at 28. She is on Crestor 10 mg daily.  10/05/2016  [name] returns today for follow-up. She seems to be doing well from a coronary standpoint. She denies any chest pain or worsening dyspnea. 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 She underwent placement of a radioactive seed which was ultimately removed after a week and she is recovering from that procedure. She was off of her aspirin for 1 week but remained on Brilinta. She is still in the twilight trial. Her cholesterol was higher than it had been previously. I increased her Crestor to 20 mg daily and we are awaiting her lipid profile which was recently drawn. She denies any recurrent AVNRT.  06/10/2017  [name] was seen today in follow-up. She was seen in the [facility] for follow-up of the Twilight trial - this has completed. She is now more than one year since drug-eluting stent placement. She is angina free. At this point she will no longer need dual antiplatelet therapy and I advised that she go on aspirin 81 mg daily. Cholesterol is now at goal based on labs 3 months ago which showed total cholesterol 139, triglycerides 177, HL 34 and LDL-C is 69. She is on Crestor 40 mg daily. She is contemplating left shoulder surgery and from my standpoint she is clear for that.  12/30/2017  [name] was seen today in follow-up.  This is an annual follow-up.  Overall she is doing well.  She had a successful shoulder surgery and  has good range of motion in her left shoulder.  She has been under a lot of stress taking care of her mother-in-law who recently passed away.  She did have labs in December which showed total cholesterol 146, triglycerides 196, HDL 32 and LDL 75.  She does report she had some weight gain and decreased activity and I feel like her cholesterol could be better controlled with more vigilant diet.  She denies any recurrent chest pain or worsening shortness of breath.  PMHx:    Past Medical History:  Diagnosis Date  . Asthma    infrequent problem - no treatment since 2006  . Choroidal nevus of left eye    sees Dr. Gerarda [facility]raction at [facility].   . Coronary artery disease   . Dysrhythmia    SVT - ablation by Dr. Curt Bears on 05/21/2016 and episodes since  . GERD (gastroesophageal reflux disease)   . History of hiatal hernia    "very small"  . Hx of colonic polyp   . Hyperlipidemia   . Hypertension   . Melanoma of eye, left ([place])    sees Dr. Cordelia Pen  . Myocardial infarction ([place])   . Osteoarthritis   . Supraventricular tachycardia [facility])     Past Surgical History:  Procedure Laterality Date  . ABDOMINAL HYSTERECTOMY    . ACHILLES TENDON REPAIR  10-07-11   torn on right, per Dr. Noemi Chapel   . ANKLE SURGERY     torn tendon left ankle  . BREAST BIOPSY    . CARDIAC CATHETERIZATION N/A 03/14/2016   Procedure: Left Heart Cath and Coronary Angiography;  Surgeon: Peter M Martinique, MD;  Location: [place] [facility];  Service: Cardiovascular;  Laterality: N/A;  . CARDIAC CATHETERIZATION N/A 03/14/2016   Procedure: Coronary Stent Intervention;  Surgeon: Peter M Martinique, MD;  Location: [place] [facility];  Service: Cardiovascular;  Laterality: N/A;  . CARPAL TUNNEL RELEASE    . CATARACT EXTRACTION W/ INTRAOCULAR LENS IMPLANT Bilateral   . CHOLECYSTECTOMY    . COLONOSCOPY  06-23-14   per Dr. Ardis Hughs, adenomatous polyps, repeat in 5 yrs    . ELECTROPHYSIOLOGIC STUDY N/A 05/21/2016   Procedure: SVT Ablation;  Surgeon: [name], MD;  Location: [place] [facility];  Service: Cardiovascular;  Laterality: N/A;  . EYE SURGERY Left 08/2016  . KNEE CLOSED REDUCTION Left 02/03/2014   Procedure: CLOSED MANIPULATION LEFT KNEE;  Surgeon: Gearlean Alf, MD;  Location: [facility];  Service: Orthopedics;  Laterality: Left;  . POLYPECTOMY    . REVERSE SHOULDER ARTHROPLASTY     Left  . REVERSE SHOULDER ARTHROPLASTY Left 06/28/2017   Procedure: LEFT SHOULDER REVERSE SHOULDER  ARTHROPLASTY;  Surgeon: Netta Cedars, MD;  Location: [place];  Service: Orthopedics;  Laterality: Left;  . TOTAL KNEE ARTHROPLASTY Left 12/21/2013   Procedure: LEFT TOTAL KNEE ARTHROPLASTY;  Surgeon: Gearlean Alf, MD;  Location: [facility];  Service: Orthopedics;  Laterality: Left;  Marland Kitchen VARICOSE VEIN SURGERY      FAMHx:  Family History  Problem Relation Age of Onset  . Colon polyps Mother   . Colon cancer Maternal Aunt   . Liver cancer Maternal Aunt   . Breast cancer Unknown   . Coronary artery disease Unknown   . Colon cancer Unknown   . Diabetes Unknown   . Hypertension Unknown   . Kidney disease Unknown   . Lung cancer Unknown   . Lung cancer Brother        spleen,  bone  . Esophageal cancer Neg Hx   . Stomach cancer Neg Hx   . Rectal cancer Neg Hx     SOCHx:   reports that  has never smoked. she has never used smokeless tobacco. She reports that she does not drink alcohol or use drugs.  ALLERGIES:  Allergies  Allergen Reactions  . Levaquin [Levofloxacin In D5w] Other (See Comments)    Leg pain   . Augmentin [Amoxicillin-Pot Clavulanate] Rash    Has patient had a PCN reaction causing immediate rash, facial/tongue/throat swelling, SOB or lightheadedness with hypotension: No Has patient had a PCN reaction causing severe rash involving mucus membranes or skin necrosis: No Has patient had a PCN reaction that required hospitalization: No Has patient had a PCN reaction occurring within the last 10 years: Yes If all of the above answers are "NO", then may proceed with Cephalosporin use.   . Codeine Other (See Comments)    flushing  . Lexapro [Escitalopram Oxalate] Rash    ROS: Pertinent items noted in HPI and remainder of comprehensive ROS otherwise negative.  HOME MEDS: Current Outpatient Medications  Medication Sig Dispense Refill  . aspirin EC 81 MG tablet Take 81 mg by mouth daily.    . Calcium-Magnesium-Vitamin D (CALCIUM 1200+D3 PO) Take 1 tablet by mouth daily.    .  cholecalciferol (VITAMIN D) 1000 units tablet Take 1,000 Units by mouth daily.    . diphenhydramine-acetaminophen (TYLENOL PM) 25-500 MG TABS tablet Take 2 tablets by mouth at bedtime as needed (for pain.).    Marland Kitchen metoprolol succinate (TOPROL-XL) 25 MG 24 hr tablet TAKE 1 TABLET BY MOUTH  TWICE A DAY 180 tablet 0  . Multiple Vitamins-Minerals (MULTIVITAMIN PO) Take 1 tablet by mouth daily.     . nitroGLYCERIN (NITROSTAT) 0.4 MG SL tablet DISSOLVE 1 TABLET UNDER THE TONGUE EVERY 5 MINUTES FOR  3 DOSES AS NEEDED FOR CHEST PAIN 100 tablet 1  . omeprazole (PRILOSEC) 40 MG capsule TAKE 1 CAPSULE BY MOUTH  DAILY 90 capsule 1  . ramipril (ALTACE) 10 MG capsule TAKE 1 CAPSULE BY MOUTH  DAILY 90 capsule 0  . triamcinolone (NASACORT) 55 MCG/ACT nasal inhaler Place 2 sprays into the nose daily. 3 Inhaler 3  . rosuvastatin (CRESTOR) 40 MG tablet Take 1 tablet (40 mg total) by mouth daily. 90 tablet 3   No current facility-administered medications for this visit.     LABS/IMAGING: No results found for this or any previous visit (from the past 48 hour(s)). No results found.  WEIGHTS: Wt Readings from Last 3 Encounters:  12/30/17 214 lb 12.8 oz (97.4 kg)  06/28/17 204 lb (92.5 kg)  06/25/17 204 lb 3.2 oz (92.6 kg)    VITALS: BP (!) 142/74   Pulse 78   Ht 5' 4.5" (1.638 m)   Wt 214 lb 12.8 oz (97.4 kg)   LMP  (LMP Unknown)   BMI 36.30 kg/m   EXAM: General appearance: alert and no distress Neck: no carotid bruit and no JVD Lungs: clear to auscultation bilaterally Heart: regular rate and rhythm, S1, S2 normal, no murmur, click, rub or gallop Abdomen: soft, non-tender; bowel sounds normal; no masses,  no organomegaly Extremities: extremities normal, atraumatic, no cyanosis or edema Pulses: 2+ and symmetric Skin: Skin color, texture, turgor normal. No rashes or lesions Neurologic: Grossly normal Psych: Pleasant  EKG: Normal sinus rhythm at 78-personally reviewed  ASSESSMENT: 1. Coronary  artery disease status post PCI with a drug-eluting stent to the mid/distal RCA (  02/2016) 2. AVNRT - s/p catheter ablation in 04/2016 3. S/p NSTEMI with abnormal nuclear stress test 4. Dyslipidemia 5. Hypertension  PLAN: 1.   Mrs. Ingman is doing well without any recurrent chest pain or shortness of breath.  She denies any recurrent SVT.  Her cholesterol is near goal but has increased somewhat.  She is also had some weight gain and less activity.  I have encouraged her to work on a 10 pound weight loss goal over the next 6 months.  We will reassess her cholesterol then and plan to see her back in the office at that time.  Blood pressure is fairly well-controlled today.    Pixie Casino, MD, [facility], [place] Director of the Advanced Lipid Disorders &  Cardiovascular Risk Reduction Clinic Diplomate of the American Board of Clinical Lipidology Attending Cardiologist  Direct Dial: [phone]  Fax: [phone]  Website:  [email or web address] 12/30/2017, 8:37 AM

## 2017-12-30 NOTE — Patient Instructions (Signed)
Medication Instructions:  Continue current medications  If you need a refill on your cardiac medications before your next appointment, please call your pharmacy.  Labwork: Fasting Lipids in 6 Months HERE IN OUR OFFICE AT [facility]  Take the provided lab slips for you to take with you to the lab for you blood draw.   You will need to fast. DO NOT EAT OR DRINK PAST MIDNIGHT.   Testing/Procedures: None Ordered  Follow-Up: Your physician wants you to follow-up in: 6 Months. You should receive a reminder letter in the mail two months in advance. If you do not receive a letter, please call our office [phone].   Thank you for choosing [facility] at [facility]!!

## 2018-01-02 ENCOUNTER — Encounter: Payer: Self-pay | Admitting: Internal Medicine

## 2018-01-27 ENCOUNTER — Encounter: Payer: Self-pay | Admitting: Family Medicine

## 2018-01-27 ENCOUNTER — Ambulatory Visit (INDEPENDENT_AMBULATORY_CARE_PROVIDER_SITE_OTHER): Payer: Medicare Other | Admitting: Family Medicine

## 2018-01-27 VITALS — BP 102/60 | HR 85 | Temp 99.0°F | Wt 214.6 lb

## 2018-01-27 DIAGNOSIS — J019 Acute sinusitis, unspecified: Secondary | ICD-10-CM

## 2018-01-27 MED ORDER — DOXYCYCLINE HYCLATE 100 MG PO TABS: 100.0000 mg | ORAL_TABLET | Freq: Two times a day (BID) | ORAL | 0 refills | Status: DC

## 2018-01-27 NOTE — Progress Notes (Signed)
   Subjective:    Patient ID: EMILLIA WEATHERLY, female    DOB: May 19, 1951, 67 y.o.   MRN: [id number]  HPI Here for one week of sinus pressure, ear pressure, and PND. Some fever at first but not now. On Nasonex and Claritin.    Review of Systems  Constitutional: Negative.   HENT: Positive for congestion, ear pain, postnasal drip, sinus pressure, sneezing and sore throat.   Eyes: Negative.   Respiratory: Positive for cough.        Objective:   Physical Exam  Constitutional: She appears well-developed and well-nourished.  HENT:  Right Ear: External ear normal.  Left Ear: External ear normal.  Nose: Nose normal.  Mouth/Throat: Oropharynx is clear and moist.  Eyes: Conjunctivae are normal.  Neck: No thyromegaly present.  Pulmonary/Chest: Effort normal and breath sounds normal. No respiratory distress. She has no wheezes. She has no rales.  Lymphadenopathy:    She has no cervical adenopathy.          Assessment & Plan:  Sinusitis, treat with Doxycycline.  Alysia Penna, MD

## 2018-02-06 ENCOUNTER — Other Ambulatory Visit: Payer: Self-pay | Admitting: Family Medicine

## 2018-02-07 ENCOUNTER — Other Ambulatory Visit: Payer: Self-pay

## 2018-02-07 DIAGNOSIS — I10 Essential (primary) hypertension: Secondary | ICD-10-CM

## 2018-02-07 MED ORDER — METOPROLOL SUCCINATE ER 25 MG PO TB24: 25.0000 mg | ORAL_TABLET | Freq: Two times a day (BID) | ORAL | 3 refills | Status: DC

## 2018-02-07 MED ORDER — RAMIPRIL 10 MG PO CAPS
10.0000 mg | ORAL_CAPSULE | Freq: Every day | ORAL | 3 refills | Status: DC
Start: 2018-02-07 — End: 2018-03-27

## 2018-02-27 ENCOUNTER — Encounter: Payer: Medicare Other | Admitting: Family Medicine

## 2018-03-02 ENCOUNTER — Encounter: Payer: Self-pay | Admitting: Family Medicine

## 2018-03-03 NOTE — Telephone Encounter (Signed)
Call in a Medrol dose pack  

## 2018-03-04 MED ORDER — METHYLPREDNISOLONE 4 MG PO TBPK: ORAL_TABLET | ORAL | 0 refills | Status: DC

## 2018-03-11 ENCOUNTER — Ambulatory Visit (INDEPENDENT_AMBULATORY_CARE_PROVIDER_SITE_OTHER): Payer: Medicare Other | Admitting: Family Medicine

## 2018-03-11 ENCOUNTER — Encounter: Payer: Self-pay | Admitting: Family Medicine

## 2018-03-11 VITALS — BP 114/64 | HR 67 | Temp 98.5°F | Ht 64.5 in | Wt 216.2 lb

## 2018-03-11 DIAGNOSIS — M150 Primary generalized (osteo)arthritis: Secondary | ICD-10-CM

## 2018-03-11 DIAGNOSIS — K219 Gastro-esophageal reflux disease without esophagitis: Secondary | ICD-10-CM | POA: Diagnosis not present

## 2018-03-11 DIAGNOSIS — C6992 Malignant neoplasm of unspecified site of left eye: Secondary | ICD-10-CM

## 2018-03-11 DIAGNOSIS — I471 Supraventricular tachycardia: Secondary | ICD-10-CM

## 2018-03-11 DIAGNOSIS — I10 Essential (primary) hypertension: Secondary | ICD-10-CM | POA: Diagnosis not present

## 2018-03-11 DIAGNOSIS — E785 Hyperlipidemia, unspecified: Secondary | ICD-10-CM | POA: Diagnosis not present

## 2018-03-11 DIAGNOSIS — M159 Polyosteoarthritis, unspecified: Secondary | ICD-10-CM

## 2018-03-11 LAB — CBC WITH DIFFERENTIAL/PLATELET
BASOS ABS: 0 10*3/uL (ref 0.0–0.1)
Basophils Relative: 0.3 % (ref 0.0–3.0)
Eosinophils Absolute: 0.3 10*3/uL (ref 0.0–0.7)
Eosinophils Relative: 5 % (ref 0.0–5.0)
HEMATOCRIT: 38.4 % (ref 36.0–46.0)
Hemoglobin: 12.9 g/dL (ref 12.0–15.0)
LYMPHS PCT: 34.6 % (ref 12.0–46.0)
Lymphs Abs: 2.1 10*3/uL (ref 0.7–4.0)
MCHC: 33.6 g/dL (ref 30.0–36.0)
MCV: 82.3 fl (ref 78.0–100.0)
MONOS PCT: 7.6 % (ref 3.0–12.0)
Monocytes Absolute: 0.5 10*3/uL (ref 0.1–1.0)
NEUTROS ABS: 3.2 10*3/uL (ref 1.4–7.7)
Neutrophils Relative %: 52.5 % (ref 43.0–77.0)
Platelets: 250 10*3/uL (ref 150.0–400.0)
RBC: 4.66 Mil/uL (ref 3.87–5.11)
RDW: 15.7 % — ABNORMAL HIGH (ref 11.5–15.5)
WBC: 6.1 10*3/uL (ref 4.0–10.5)

## 2018-03-11 LAB — POC URINALSYSI DIPSTICK (AUTOMATED)
Bilirubin, UA: NEGATIVE
Glucose, UA: NEGATIVE
Ketones, UA: NEGATIVE
LEUKOCYTES UA: NEGATIVE
NITRITE UA: NEGATIVE
PH UA: 6.5 (ref 5.0–8.0)
PROTEIN UA: NEGATIVE
Spec Grav, UA: 1.01 (ref 1.010–1.025)
UROBILINOGEN UA: 0.2 U/dL

## 2018-03-11 LAB — HEPATIC FUNCTION PANEL
ALT: 18 U/L (ref 0–35)
AST: 15 U/L (ref 0–37)
Albumin: 3.9 g/dL (ref 3.5–5.2)
Alkaline Phosphatase: 49 U/L (ref 39–117)
BILIRUBIN DIRECT: 0.1 mg/dL (ref 0.0–0.3)
BILIRUBIN TOTAL: 0.4 mg/dL (ref 0.2–1.2)
Total Protein: 7 g/dL (ref 6.0–8.3)

## 2018-03-11 LAB — BASIC METABOLIC PANEL
BUN: 16 mg/dL (ref 6–23)
CALCIUM: 9.2 mg/dL (ref 8.4–10.5)
CHLORIDE: 100 meq/L (ref 96–112)
CO2: 28 mEq/L (ref 19–32)
CREATININE: 0.63 mg/dL (ref 0.40–1.20)
GFR: 99.93 mL/min (ref >60.00)
Glucose, Bld: 91 mg/dL (ref 70–99)
Potassium: 4.4 mEq/L (ref 3.5–5.1)
Sodium: 136 mEq/L (ref 135–145)

## 2018-03-11 LAB — LIPID PANEL
CHOL/HDL RATIO: 5
Cholesterol: 152 mg/dL (ref 0–200)
HDL: 32.1 mg/dL — ABNORMAL LOW (ref >39.00)
NONHDL: 120.37
TRIGLYCERIDES: 234 mg/dL — AB (ref 0.0–149.0)
VLDL: 46.8 mg/dL — AB (ref 0.0–40.0)

## 2018-03-11 LAB — LDL CHOLESTEROL, DIRECT: Direct LDL: 96 mg/dL

## 2018-03-11 LAB — TSH: TSH: 1.49 u[IU]/mL (ref 0.35–4.50)

## 2018-03-11 NOTE — Progress Notes (Signed)
   Subjective:    Patient ID: Natalie Hall, female    DOB: 12-10-1950, 67 y.o.   MRN: [id number]  HPI Here to follow up on several issues. She feels well in general. She sees Dr. Gerarda Fraction at [facility] for a hx of melanoma in the left eye. He has recommended she have yearly CT scans of the chest and abdomen for 10 years to look for metastases. She sees her regular ophthalmologist, Dr. Juanito Doom, in July. Her BP is stable and she denies any SOB or chest pain.   Review of Systems  Constitutional: Negative.   HENT: Negative.   Eyes: Negative.   Respiratory: Negative.   Cardiovascular: Negative.   Gastrointestinal: Negative.   Genitourinary: Negative for decreased urine volume, difficulty urinating, dyspareunia, dysuria, enuresis, flank pain, frequency, hematuria, pelvic pain and urgency.  Musculoskeletal: Negative.   Skin: Negative.   Neurological: Negative.   Psychiatric/Behavioral: Negative.        Objective:   Physical Exam  Constitutional: She is oriented to person, place, and time. She appears well-developed and well-nourished. No distress.  HENT:  Head: Normocephalic and atraumatic.  Right Ear: External ear normal.  Left Ear: External ear normal.  Nose: Nose normal.  Mouth/Throat: Oropharynx is clear and moist. No oropharyngeal exudate.  Eyes: Pupils are equal, round, and reactive to light. Conjunctivae and EOM are normal. No scleral icterus.  Neck: Normal range of motion. Neck supple. No JVD present. No thyromegaly present.  Cardiovascular: Normal rate, regular rhythm, normal heart sounds and intact distal pulses. Exam reveals no gallop and no friction rub.  No murmur heard. Pulmonary/Chest: Effort normal and breath sounds normal. No respiratory distress. She has no wheezes. She has no rales. She exhibits no tenderness.  Abdominal: Soft. Bowel sounds are normal. She exhibits no distension and no mass. There is no tenderness. There is no rebound and no guarding.    Musculoskeletal: Normal range of motion. She exhibits no edema or tenderness.  Lymphadenopathy:    She has no cervical adenopathy.  Neurological: She is alert and oriented to person, place, and time. She has normal reflexes. She displays normal reflexes. No cranial nerve deficit. She exhibits normal muscle tone. Coordination normal.  Skin: Skin is warm and dry. No rash noted. No erythema.  Psychiatric: She has a normal mood and affect. Her behavior is normal. Judgment and thought content normal.          Assessment & Plan:  Her HTN is stable. No signs of SVT for at least a year now. Her GERD is well controlled. We wil send er for fasting labs today. Set up contrasted CT scans of chest and abdomen.  Alysia Penna, MD

## 2018-03-27 ENCOUNTER — Other Ambulatory Visit: Payer: Self-pay | Admitting: Internal Medicine

## 2018-03-27 ENCOUNTER — Encounter: Payer: Self-pay | Admitting: Family Medicine

## 2018-03-27 DIAGNOSIS — I10 Essential (primary) hypertension: Secondary | ICD-10-CM

## 2018-03-27 NOTE — Telephone Encounter (Signed)
Rx sent to pharmacy   

## 2018-03-31 NOTE — Telephone Encounter (Signed)
The orders were sent in that day, as seen in her chart. Please call [facility] to see what the problem is

## 2018-04-10 ENCOUNTER — Ambulatory Visit (HOSPITAL_COMMUNITY): Payer: Medicare Other

## 2018-04-10 ENCOUNTER — Inpatient Hospital Stay: Admission: RE | Admit: 2018-04-10 | Payer: Medicare Other | Source: Ambulatory Visit

## 2018-04-10 ENCOUNTER — Ambulatory Visit
Admission: RE | Admit: 2018-04-10 | Discharge: 2018-04-10 | Disposition: A | Discharge status: Home / Self Care | Payer: Medicare Other | Source: Ambulatory Visit | Attending: Family Medicine | Admitting: Family Medicine

## 2018-04-10 DIAGNOSIS — C6992 Malignant neoplasm of unspecified site of left eye: Secondary | ICD-10-CM

## 2018-04-10 MED ORDER — IOHEXOL 300 MG/ML  SOLN
125.0000 mL | Freq: Once | INTRAMUSCULAR | Status: AC | PRN
  Administered 2018-04-10: 125 mL via INTRAVENOUS

## 2018-04-17 ENCOUNTER — Encounter: Payer: Self-pay | Admitting: Family Medicine

## 2018-04-18 DIAGNOSIS — H26492 Other secondary cataract, left eye: Secondary | ICD-10-CM | POA: Insufficient documentation

## 2018-04-25 NOTE — Telephone Encounter (Signed)
The cyst is benign and there is nothing to worry about. It was seen on her CT scan from 08-24-16 also and it has not changed. There is no need to get an [name]

## 2018-05-06 ENCOUNTER — Other Ambulatory Visit: Payer: Self-pay | Admitting: Family Medicine

## 2018-05-08 ENCOUNTER — Encounter: Payer: Self-pay | Admitting: Family Medicine

## 2018-05-08 ENCOUNTER — Ambulatory Visit (INDEPENDENT_AMBULATORY_CARE_PROVIDER_SITE_OTHER): Payer: Medicare Other | Admitting: Family Medicine

## 2018-05-08 VITALS — BP 140/70 | HR 88 | Temp 98.4°F | Wt 216.0 lb

## 2018-05-08 DIAGNOSIS — J019 Acute sinusitis, unspecified: Secondary | ICD-10-CM | POA: Diagnosis not present

## 2018-05-08 MED ORDER — CEFUROXIME AXETIL 500 MG PO TABS: 500.0000 mg | ORAL_TABLET | Freq: Two times a day (BID) | ORAL | 0 refills | Status: DC

## 2018-05-08 NOTE — Progress Notes (Signed)
   Subjective:    Patient ID: Natalie Hall, female    DOB: 04-May-1951, 67 y.o.   MRN: [id number]  HPI Here for one week of sinus congestion, PND, and coughing up yellow sputum. Fevers up to 100 degrees. She has been using Flonase and Zyrtec. She spent last week in [place] on a mission trip.    Review of Systems  Constitutional: Negative.   HENT: Positive for congestion, postnasal drip and sinus pressure. Negative for sinus pain and sore throat.   Respiratory: Positive for cough.        Objective:   Physical Exam  Constitutional: She appears well-developed and well-nourished.  HENT:  Right Ear: External ear normal.  Left Ear: External ear normal.  Nose: Nose normal.  Mouth/Throat: Oropharynx is clear and moist.  Eyes: Conjunctivae are normal.  Neck: No thyromegaly present.  Cardiovascular: Normal rate, regular rhythm, normal heart sounds and intact distal pulses.  Pulmonary/Chest: Effort normal and breath sounds normal. No stridor. No respiratory distress. She has no wheezes. She has no rales.  Lymphadenopathy:    She has no cervical adenopathy.          Assessment & Plan:  Sinusitis, treat with Ceftin.  Alysia Penna, MD

## 2018-05-14 NOTE — Progress Notes (Addendum)
Subjective:   Natalie Hall is a 67 y.o. female who presents for Medicare Annual (Subsequent) preventive examination.  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 Debara Pickett watching the triglycerides    Diet Chol/hdl 5 Trig 234/ hdl 32; 02/2018 Breakfast cheerios with a banana Lunch; whatever leftovers;  Supper is a meat and 2 vegetables Ice cream in moderation Fruit bars    Exercise She can't lift due to her eye Just finished PT [facility] and is going for 3 months for 50.00 a month. Treadmill, arm bike and recumbent bike  3 days a week . 90 minutes     There are no preventive care reminders to display for this patient. Colonoscopy 06/2014 - 2020  Mammogram 12/2016 - had [facility] - also had in march this year  dexa 12/2016 - osteopenia   July 31st of 2017 ablation   Functional loss from last year to this year Takes 2 days to clean      Objective:     Vitals: BP 118/60   Pulse 73   Ht 5' 4.5" (1.638 m)   Wt 216 lb (98 kg)   LMP  (LMP Unknown)   SpO2 97%   BMI 36.50 kg/m   Body mass index is 36.5 kg/m.  Advanced Directives 05/15/2018 06/29/2017 06/28/2017 06/25/2017 05/14/2017 05/21/2016 05/21/2016  Does Patient Have a Medical Advance Directive? Yes - Yes Yes Yes Yes Yes  Type of Advance Directive - - Public librarian;Living will [place];Living will [place];Living will [place];Living will  Does patient want to make changes to medical advance directive? - No - Patient declined - No - Patient declined - No - Patient declined No - Patient declined  Copy of Healthcare Power of Attorney in Chart? - - - No - copy requested No - copy  requested No - copy requested No - copy requested  Would patient like information on creating a medical advance directive? - - - - - - -    Tobacco Social History   Tobacco Use  Smoking Status Never Smoker  Smokeless Tobacco Never Used     Counseling given: Yes   Clinical Intake:   Past Medical History:  Diagnosis Date  . Asthma    infrequent problem - no treatment since 2006  . Choroidal nevus of left eye    sees Dr. Gerarda Fraction at [facility].   . Coronary artery disease   . Dysrhythmia    SVT - ablation by Dr. Curt Bears on 05/21/2016 and episodes since  . GERD (gastroesophageal reflux disease)   . History of hiatal hernia    "very small"  . Hx of colonic polyp   . Hyperlipidemia   . Hypertension   . Melanoma of eye, left ([place])    sees Dr. Cordelia Pen  . Myocardial infarction ([place])   . Osteoarthritis   . Supraventricular tachycardia [facility])    Past Surgical History:  Procedure Laterality Date  . ABDOMINAL HYSTERECTOMY    . ACHILLES TENDON REPAIR  10-07-11   torn on right, per Dr. Noemi Chapel   . ANKLE SURGERY  torn tendon left ankle  . BREAST BIOPSY    . CARDIAC CATHETERIZATION N/A 03/14/2016   Procedure: Left Heart Cath and Coronary Angiography;  Surgeon: Peter M Martinique, MD;  Location: [facility];  Service: Cardiovascular;  Laterality: N/A;  . CARDIAC CATHETERIZATION N/A 03/14/2016   Procedure: Coronary Stent Intervention;  Surgeon: Peter M Martinique, MD;  Location: [facility];  Service: Cardiovascular;  Laterality: N/A;  . CARPAL TUNNEL RELEASE    . CATARACT EXTRACTION W/ INTRAOCULAR LENS IMPLANT Bilateral   . CHOLECYSTECTOMY    . COLONOSCOPY  06-23-14   per Dr. Ardis Hughs, adenomatous polyps, repeat in 5 yrs    . ELECTROPHYSIOLOGIC STUDY N/A 05/21/2016   Procedure: SVT Ablation;  Surgeon: [name], MD;  Location: [facility];  Service: Cardiovascular;  Laterality: N/A;  . EYE SURGERY Left 08/2016  . KNEE CLOSED REDUCTION Left 02/03/2014     Procedure: CLOSED MANIPULATION LEFT KNEE;  Surgeon: Gearlean Alf, MD;  Location: [facility];  Service: Orthopedics;  Laterality: Left;  . POLYPECTOMY    . REVERSE SHOULDER ARTHROPLASTY     Left  . REVERSE SHOULDER ARTHROPLASTY Left 06/28/2017   Procedure: LEFT SHOULDER REVERSE SHOULDER ARTHROPLASTY;  Surgeon: Netta Cedars, MD;  Location: [place];  Service: Orthopedics;  Laterality: Left;  . TOTAL KNEE ARTHROPLASTY Left 12/21/2013   Procedure: LEFT TOTAL KNEE ARTHROPLASTY;  Surgeon: Gearlean Alf, MD;  Location: [facility];  Service: Orthopedics;  Laterality: Left;  Marland Kitchen VARICOSE VEIN SURGERY     Family History  Problem Relation Age of Onset  . Colon polyps Mother   . Colon cancer Maternal Aunt   . Liver cancer Maternal Aunt   . Breast cancer Unknown   . Coronary artery disease Unknown   . Colon cancer Unknown   . Diabetes Unknown   . Hypertension Unknown   . Kidney disease Unknown   . Lung cancer Unknown   . Lung cancer Brother        spleen, bone  . Esophageal cancer Neg Hx   . Stomach cancer Neg Hx   . Rectal cancer Neg Hx    Social History   Socioeconomic History  . Marital status: Married    Spouse name: Not on file  . Number of children: Not on file  . Years of education: Not on file  . Highest education level: Not on file  Occupational History  . Not on file  Social Needs  . Financial resource strain: Not on file  . Food insecurity:    Worry: Not on file    Inability: Not on file  . Transportation needs:    Medical: Not on file    Non-medical: Not on file  Tobacco Use  . Smoking status: Never Smoker  . Smokeless tobacco: Never Used  Substance and Sexual Activity  . Alcohol use: No    Alcohol/week: 0.0 oz  . Drug use: No  . Sexual activity: Not on file  Lifestyle  . Physical activity:    Days per week: Not on file    Minutes per session: Not on file  . Stress: Not on file  Relationships  . Social connections:    Talks on phone: Not on file    Gets together:  Not on file    Attends religious service: Not on file    Active member of club or organization: Not on file    Attends meetings of clubs or organizations: Not on file  Relationship status: Not on file  Other Topics Concern  . Not on file  Social History Narrative  . Not on file    Outpatient Encounter Medications as of 05/15/2018  Medication Sig  . aspirin EC 81 MG tablet Take 81 mg by mouth daily.  . Calcium-Magnesium-Vitamin D (CALCIUM 1200+D3 PO) Take 1 tablet by mouth daily.  . cefUROXime (CEFTIN) 500 MG tablet Take 1 tablet (500 mg total) by mouth 2 (two) times daily with a meal.  . cholecalciferol (VITAMIN D) 1000 units tablet Take 1,000 Units by mouth daily.  . diphenhydramine-acetaminophen (TYLENOL PM) 25-500 MG TABS tablet Take 2 tablets by mouth at bedtime as needed (for pain.).  Marland Kitchen metoprolol succinate (TOPROL-XL) 25 MG 24 hr tablet TAKE 1 TABLET BY MOUTH  TWICE A DAY  . Multiple Vitamins-Minerals (MULTIVITAMIN PO) Take 1 tablet by mouth daily.   . nitroGLYCERIN (NITROSTAT) 0.4 MG SL tablet DISSOLVE 1 TABLET UNDER THE TONGUE EVERY 5 MINUTES FOR  3 DOSES AS NEEDED FOR CHEST PAIN  . omeprazole (PRILOSEC) 40 MG capsule TAKE 1 CAPSULE BY MOUTH  DAILY  . ramipril (ALTACE) 10 MG capsule TAKE 1 CAPSULE BY MOUTH  DAILY  . triamcinolone (NASACORT) 55 MCG/ACT nasal inhaler Place 2 sprays into the nose daily.  . rosuvastatin (CRESTOR) 40 MG tablet Take 1 tablet (40 mg total) by mouth daily.   No facility-administered encounter medications on file as of 05/15/2018.     Activities of Daily Living In your present state of health, do you have any difficulty performing the following activities: 05/15/2018 06/28/2017  Hearing? N N  Vision? N N  Difficulty concentrating or making decisions? N N  Walking or climbing stairs? Y N  Dressing or bathing? N N  Doing errands, shopping? N N  Preparing Food and eating ? N -  Using the Toilet? N -  In the past six months, have you accidently leaked  urine? N -  Do you have problems with loss of bowel control? N -  Managing your Medications? N -  Managing your Finances? N -  Housekeeping or managing your Housekeeping? N -  Some recent data might be hidden    Patient Care Team: Laurey Morale, MD as PCP - General Hilty, Nadean Corwin, MD as PCP - Cardiology (Cardiology) Netta Cedars, MD as Consulting Physician (Orthopedic Surgery) [place], [place] (Specialist) Debara Pickett Nadean Corwin, MD as Consulting Physician (Cardiology) [place], Jennefer Bravo, MD as Referring Physician (Dermatology)    Assessment:   This is a routine wellness examination for Ailanie.  Exercise Activities and Dietary recommendations Current Exercise Habits: Structured exercise class, Time (Minutes): 60, Frequency (Times/Week): 4, Weekly Exercise (Minutes/Week): 240, Intensity: Moderate  Goals    . Exercise 150 min/wk Moderate Activity     Keep up exercise at the PT area    . Weight (lb) < 200 lb (90.7 kg)     Cardiac rehab  Try the same things you did in while in rehab   Eat more vegetables Stay away from fried foods  Check out  online nutrition programs as GumSearch.nl and http://vang.com/; fit55me; Look for foods with "whole" wheat; bran; oatmeal etc Shot at the farmer's markets in season for fresher choices  Watch for "hydrogenated" on the label of oils which are trans-fats.  Watch for "high fructose corn syrup" in snacks, yogurt or ketchup  Meats have less marbling; bright colored fruits and vegetables;  Canned; dump out liquid and wash vegetables. Be mindful of what we are  eating  Portion control is essential to a health weight! Sit down; take a break and enjoy your meal; take smaller bites; put the fork down between bites;  It takes 20 minutes to get full; so check in with your fullness cues and stop eating when you start to fill full              Fall Risk Fall Risk  05/15/2018 05/14/2017 05/24/2016 07/13/2015 07/13/2015  Falls in  the past year? No No No No No     Depression Screen PHQ 2/9 Scores 05/15/2018 05/14/2017 08/28/2016 05/28/2016  PHQ - 2 Score 0 0 0 0     Cognitive Function MMSE - Mini Mental State Exam 05/15/2018  Not completed: (No Data)     Ad8 score reviewed for issues:  Issues making decisions:  Less interest in hobbies / activities:  Repeats questions, stories (family complaining):  Trouble using ordinary gadgets (microwave, computer, phone):  Forgets the month or year:   Mismanaging finances:   Remembering appts:  Daily problems with thinking and/or memory: Ad8 score is=0 Mother is 35       Immunization History  Administered Date(s) Administered  . Influenza Split 08/06/2011, 08/18/2012  . Influenza Whole 08/05/2009, 07/19/2010  . Influenza, High Dose Seasonal PF 08/15/2015, 08/01/2016, 07/23/2017  . Influenza,inj,Quad PF,6+ Mos 07/24/2013  . Pneumococcal Conjugate-13 04/06/2015  . Pneumococcal Polysaccharide-23 10/22/2004  . Tdap 04/06/2015  . Zoster 04/12/2010  . Zoster Recombinat (Shingrix) 09/21/2017, 12/25/2017      Screening Tests Health Maintenance  Topic Date Due  . PNA vac Low Risk Adult (2 of 2 - PPSV23) 07/23/2019 (Originally 04/05/2016)  . INFLUENZA VACCINE  05/22/2018  . COLONOSCOPY  06/24/2019  . MAMMOGRAM  12/21/2019  . TETANUS/TDAP  04/05/2025  . DEXA SCAN  Completed  . Hepatitis C Screening  Completed         Plan:      PCP Notes   Health Maintenance Colonoscopy 06/2014 - 2020  Mammogram 12/2016 - had [facility] - also had in March this year  dexa 12/2016 - osteopenia   Functional loss from last year to this year Takes 2 days to clean Now going to PT [facility] to continue her exercise 60 minutes or more 3 times a week     Abnormal Screens  none  Referrals  none  Patient concerns; Lingering cough from recent cold, otherwise doing well   Nurse Concerns; As noted  Next PCP apt [facility] completed 5/21 Cardiology watching  Trig Did educated and verbalized understanding regarding diet        I have personally reviewed and noted the following in the patient's chart:   . Medical and social history . Use of alcohol, tobacco or illicit drugs  . Current medications and supplements . Functional ability and status . Nutritional status . Physical activity . Advanced directives . List of other physicians . Hospitalizations, surgeries, and ER visits in previous 12 months . Vitals . Screenings to include cognitive, depression, and falls . Referrals and appointments  In addition, I have reviewed and discussed with patient certain preventive protocols, quality metrics, and best practice recommendations. A written personalized care plan for preventive services as well as general preventive health recommendations were provided to patient.     PYKDX,IPJAS, RN  05/15/2018   I have read this note and agree with its contents.  Alysia Penna, MD

## 2018-05-15 ENCOUNTER — Ambulatory Visit (INDEPENDENT_AMBULATORY_CARE_PROVIDER_SITE_OTHER): Payer: Medicare Other

## 2018-05-15 VITALS — BP 118/60 | HR 73 | Ht 64.5 in | Wt 216.0 lb

## 2018-05-15 DIAGNOSIS — Z0000 Encounter for general adult medical examination without abnormal findings: Secondary | ICD-10-CM

## 2018-05-15 NOTE — Patient Instructions (Addendum)
Natalie Hall , Thank you for taking time to come for your Medicare Wellness Visit. I appreciate your ongoing commitment to your health goals. Please review the following plan we discussed and let me know if I can assist you in the future.   Recommendations for Dexa Scan Female over the age of 59 Man age 68 or older If you broke a bone past the age of 54 Women menopausal age with risk factors (thin frame; smoker; hx of fx ) Post menopausal women under the age of 19 with risk factors A man age 43 to 53 with risk factors Other: Spine xray that is showing break of bone loss Back pain with possible break Height loss of 1/2 inch or more within one year Total loss in height of 1.5 inches from your original height  Calcium 1253m with Vit D 800u per day; more as directed by physician Strength building exercises discussed; can include walking; housework; small weights or stretch bands; silver sneakers [facility]  Please visit the osteoporosis foundation.org for up to date recommendations  Will hold due to not feeling well, but you can take this when you come in Check with [facility] to see if you've had the psv 23   The Centers for Disease Control are now recommending 2 pneumonia vaccinations after 65. The first is the Prevnar 13. This helps to boost your immunity to community acquired pneumonia as well as some protection from bacterial pneumonia  The 2nd is the pneumovax 23, which offers more broad protection!  Please consider taking these as this is your best protection against pneumonia. Will take your PSV 23; last pneumonia vaccine when you take your flu vaccine   Check with [facility] regarding the dates that you took the shingrix series.  Prevention of falls: Remove rugs or any tripping hazards in the home Use Non slip mats in bathtubs and showers Placing grab bars next to the toilet and or shower Placing handrails on both sides of the stair way Adding extra lighting in the home.    Personal safety issues reviewed:  1. Consider starting a community watch program [facility].  Changes batteries is smoke detector and/or carbon monoxide detector  3.  If you have firearms; keep them in a safe place 4.  Wear protection when in the sun; Always wear sunscreen or a hat; It is good to have your doctor check your skin annually or review any new areas of concern 5. Driving safety; Keep in the right lane; stay 3 car lengths behind the car in front of you on the highway; look 3 times prior to pulling out; carry your cell phone everywhere you go!      These are the goals we discussed: Goals    . Exercise 150 min/wk Moderate Activity     Keep up exercise at the PT area    . Weight (lb) < 200 lb (90.7 kg)     Cardiac rehab  Try the same things you did in while in rehab   Eat more vegetables Stay away from fried foods  Check out  online nutrition programs as c[employer]umSearch[employer] mhttp://vang.com/ fit251m Look for foods with "whole" wheat; bran; oatmeal etc Shot at the farmer's markets in season for fresher choices  Watch for "hydrogenated" on the label of oils which are trans-fats.  Watch for "high fructose corn syrup" in snacks, yogurt or ketchup  Meats have less marbling; bright colored fruits and vegetables;  Canned; dump  out liquid and wash vegetables. Be mindful of what we are eating  Portion control is essential to a health weight! Sit down; take a break and enjoy your meal; take smaller bites; put the fork down between bites;  It takes 20 minutes to get full; so check in with your fullness cues and stop eating when you start to fill full              This is a list of the screening recommended for you and due dates:  Health Maintenance  Topic Date Due  . Pneumonia vaccines (2 of 2 - PPSV23) 07/23/2019*  . Flu Shot  05/22/2018  . Colon Cancer Screening  06/24/2019  . Mammogram  12/21/2019  . Tetanus Vaccine  04/05/2025  . DEXA scan  (bone density measurement)  Completed  .  Hepatitis C: One time screening is recommended by Center for Disease Control  (CDC) for  adults born from [birth year] through 1965.   Completed  *Topic was postponed. The date shown is not the original due date.   Health Maintenance, Female Adopting a healthy lifestyle and getting preventive care can go a long way to promote health and wellness. Talk with your health care provider about what schedule of regular examinations is right for you. This is a good chance for you to check in with your provider about disease prevention and staying healthy. In between checkups, there are plenty of things you can do on your own. Experts have done a lot of research about which lifestyle changes and preventive measures are most likely to keep you healthy. Ask your health care provider for more information. Weight and diet Eat a healthy diet  Be sure to include plenty of vegetables, fruits, low-fat dairy products, and lean protein.  Do not eat a lot of foods high in solid fats, added sugars, or salt.  Get regular exercise. This is one of the most important things you can do for your health. ? Most adults should exercise for at least 150 minutes each week. The exercise should increase your heart rate and make you sweat (moderate-intensity exercise). ? Most adults should also do strengthening exercises at least twice a week. This is in addition to the moderate-intensity exercise.  Maintain a healthy weight  Body mass index (BMI) is a measurement that can be used to identify possible weight problems. It estimates body fat based on height and weight. Your health care provider can help determine your BMI and help you achieve or maintain a healthy weight.  For females 61 years of age and older: ? A BMI below 18.5 is considered underweight. ? A BMI of 18.5 to 24.9 is normal. ? A BMI of 25 to 29.9 is considered overweight. ? A BMI of 30 and above is considered obese.  Watch  levels of cholesterol and blood lipids  You should start having your blood tested for lipids and cholesterol at 68 years of age, then have this test every 5 years.  You may need to have your cholesterol levels checked more often if: ? Your lipid or cholesterol levels are high. ? You are older than 68 years of age. ? You are at high risk for heart disease.  Cancer screening Lung Cancer  Lung cancer screening is recommended for adults 75-77 years old who are at high risk for lung cancer because of a history of smoking.  A yearly low-dose CT scan of the lungs is recommended for people who: ? Currently smoke. ? Have  quit within the past 15 years. ? Have at least a 30-pack-year history of smoking. A pack year is smoking an average of one pack of cigarettes a day for 1 year.  Yearly screening should continue until it has been 15 years since you quit.  Yearly screening should stop if you develop a health problem that would prevent you from having lung cancer treatment.  Breast Cancer  Practice breast self-awareness. This means understanding how your breasts normally appear and feel.  It also means doing regular breast self-exams. Let your health care provider know about any changes, no matter how small.  If you are in your 20s or 30s, you should have a clinical breast exam (CBE) by a health care provider every 1-3 years as part of a regular health exam.  If you are 37 or older, have a CBE every year. Also consider having a breast X-ray (mammogram) every year.  If you have a family history of breast cancer, talk to your health care provider about genetic screening.  If you are at high risk for breast cancer, talk to your health care provider about having an MRI and a mammogram every year.  Breast cancer gene (BRCA) assessment is recommended for women who have family members with BRCA-related cancers. BRCA-related cancers include: ? Breast. ? Ovarian. ? Tubal. ? Peritoneal  cancers.  Results of the assessment will determine the need for genetic counseling and BRCA1 and BRCA2 testing.  Cervical Cancer Your health care provider may recommend that you be screened regularly for cancer of the pelvic organs (ovaries, uterus, and vagina). This screening involves a pelvic examination, including checking for microscopic changes to the surface of your cervix (Pap test). You may be encouraged to have this screening done every 3 years, beginning at age 68.  For women ages 4-65, health care providers may recommend pelvic exams and Pap testing every 3 years, or they may recommend the Pap and pelvic exam, combined with testing for human papilloma virus (HPV), every 5 years. Some types of HPV increase your risk of cervical cancer. Testing for HPV may also be done on women of any age with unclear Pap test results.  Other health care providers may not recommend any screening for nonpregnant women who are considered low risk for pelvic cancer and who do not have symptoms. Ask your health care provider if a screening pelvic exam is right for you.  If you have had past treatment for cervical cancer or a condition that could lead to cancer, you need Pap tests and screening for cancer for at least 20 years after your treatment. If Pap tests have been discontinued, your risk factors (such as having a new sexual partner) need to be reassessed to determine if screening should resume. Some women have medical problems that increase the chance of getting cervical cancer. In these cases, your health care provider may recommend more frequent screening and Pap tests.  Colorectal Cancer  This type of cancer can be detected and often prevented.  Routine colorectal cancer screening usually begins at 68 years of age and continues through 68 years of age.  Your health care provider may recommend screening at an earlier age if you have risk factors for colon cancer.  Your health care provider may also  recommend using home test kits to check for hidden blood in the stool.  A small camera at the end of a tube can be used to examine your colon directly (sigmoidoscopy or colonoscopy). This is done to  check for the earliest forms of colorectal cancer.  Routine screening usually begins at age 76.  Direct examination of the colon should be repeated every 5-10 years through 67 years of age. However, you may need to be screened more often if early forms of precancerous polyps or small growths are found.  Skin Cancer  Check your skin from head to toe regularly.  Tell your health care provider about any new moles or changes in moles, especially if there is a change in a mole's shape or color.  Also tell your health care provider if you have a mole that is larger than the size of a pencil eraser.  Always use sunscreen. Apply sunscreen liberally and repeatedly throughout the day.  Protect yourself by wearing long sleeves, pants, a wide-brimmed hat, and sunglasses whenever you are outside.  Heart disease, diabetes, and high blood pressure  High blood pressure causes heart disease and increases the risk of stroke. High blood pressure is more likely to develop in: ? People who have blood pressure in the high end of the normal range (130-139/85-89 mm Hg). ? People who are overweight or obese. ? People who are African American.  If you are 72-7 years of age, have your blood pressure checked every 3-5 years. If you are 81 years of age or older, have your blood pressure checked every year. You should have your blood pressure measured twice-once when you are at a hospital or clinic, and once when you are not at a hospital or clinic. Record the average of the two measurements. To check your blood pressure when you are not at a hospital or clinic, you can use: ? An automated blood pressure machine at a pharmacy. ? A home blood pressure monitor.  If you are between 50 years and 31 years old, ask your  health care provider if you should take aspirin to prevent strokes.  Have regular diabetes screenings. This involves taking a blood sample to check your fasting blood sugar level. ? If you are at a normal weight and have a low risk for diabetes, have this test once every three years after 68 years of age. ? If you are overweight and have a high risk for diabetes, consider being tested at a younger age or more often. Preventing infection Hepatitis B  If you have a higher risk for hepatitis B, you should be screened for this virus. You are considered at high risk for hepatitis B if: ? You were born in a country where hepatitis B is common. Ask your health care provider which countries are considered high risk. ? Your parents were born in a high-risk country, and you have not been immunized against hepatitis B (hepatitis B vaccine). ? You have HIV or AIDS. ? You use needles to inject street drugs. ? You live with someone who has hepatitis B. ? You have had sex with someone who has hepatitis B. ? You get hemodialysis treatment. ? You take certain medicines for conditions, including cancer, organ transplantation, and autoimmune conditions.  Hepatitis C  Blood testing is recommended for: ? Everyone born from [birth year] through 1965. ? Anyone with known risk factors for hepatitis C.  Sexually transmitted infections (STIs)  You should be screened for sexually transmitted infections (STIs) including gonorrhea and chlamydia if: ? You are sexually active and are younger than 68 years of age. ? You are older than 68 years of age and your health care provider tells you that you are at risk for  this type of infection. ? Your sexual activity has changed since you were last screened and you are at an increased risk for chlamydia or gonorrhea. Ask your health care provider if you are at risk.  If you do not have HIV, but are at risk, it may be recommended that you take a prescription medicine daily to  prevent HIV infection. This is called pre-exposure prophylaxis (PrEP). You are considered at risk if: ? You are sexually active and do not regularly use condoms or know the HIV status of your partner(s). ? You take drugs by injection. ? You are sexually active with a partner who has HIV.  Talk with your health care provider about whether you are at high risk of being infected with HIV. If you choose to begin PrEP, you should first be tested for HIV. You should then be tested every 3 months for as long as you are taking PrEP. Pregnancy  If you are premenopausal and you may become pregnant, ask your health care provider about preconception counseling.  If you may become pregnant, take 400 to 800 micrograms (mcg) of folic acid every day.  If you want to prevent pregnancy, talk to your health care provider about birth control (contraception). Osteoporosis and menopause  Osteoporosis is a disease in which the bones lose minerals and strength with aging. This can result in serious bone fractures. Your risk for osteoporosis can be identified using a bone density scan.  If you are 43 years of age or older, or if you are at risk for osteoporosis and fractures, ask your health care provider if you should be screened.  Ask your health care provider whether you should take a calcium or vitamin D supplement to lower your risk for osteoporosis.  Menopause may have certain physical symptoms and risks.  Hormone replacement therapy may reduce some of these symptoms and risks. Talk to your health care provider about whether hormone replacement therapy is right for you. Follow these instructions at home:  Schedule regular health, dental, and eye exams.  Stay current with your immunizations.  Do not use any tobacco products including cigarettes, chewing tobacco, or electronic cigarettes.  If you are pregnant, do not drink alcohol.  If you are breastfeeding, limit how much and how often you drink  alcohol.  Limit alcohol intake to no more than 1 drink per day for nonpregnant women. One drink equals 12 ounces of beer, 5 ounces of wine, or 1 ounces of hard liquor.  Do not use street drugs.  Do not share needles.  Ask your health care provider for help if you need support or information about quitting drugs.  Tell your health care provider if you often feel depressed.  Tell your health care provider if you have ever been abused or do not feel safe at home. This information is not intended to replace advice given to you by your health care provider. Make sure you discuss any questions you have with your health care provider. Document Released: 04/23/2011 Document Revised: 03/15/2016 Document Reviewed: 07/12/2015 Elsevier Interactive Patient Education  2018 [place] in the Home Falls can cause injuries and can affect people from all age groups. There are many simple things that you can do to make your home safe and to help prevent falls. What can I do on the outside of my home?  Regularly repair the edges of walkways and driveways and fix any cracks.  Remove high doorway thresholds.  Trim any  shrubbery on the main path into your home.  Use bright outdoor lighting.  Clear walkways of debris and clutter, including tools and rocks.  Regularly check that handrails are securely fastened and in good repair. Both sides of any steps should have handrails.  Install guardrails along the edges of any raised decks or porches.  Have leaves, snow, and ice cleared regularly.  Use sand or salt on walkways during winter months.  In the garage, clean up any spills right away, including grease or oil spills. What can I do in the bathroom?  Use night lights.  Install grab bars by the toilet and in the tub and shower. Do not use towel bars as grab bars.  Use non-skid mats or decals on the floor of the tub or shower.  If you need to sit down while you are in  the shower, use a plastic, non-slip stool.  Keep the floor dry. Immediately clean up any water that spills on the floor.  Remove soap buildup in the tub or shower on a regular basis.  Attach bath mats securely with double-sided non-slip rug tape.  Remove throw rugs and other tripping hazards from the floor. What can I do in the bedroom?  Use night lights.  Make sure that a bedside light is easy to reach.  Do not use oversized bedding that drapes onto the floor.  Have a firm chair that has side arms to use for getting dressed.  Remove throw rugs and other tripping hazards from the floor. What can I do in the kitchen?  Clean up any spills right away.  Avoid walking on wet floors.  Place frequently used items in easy-to-reach places.  If you need to reach for something above you, use a sturdy step stool that has a grab bar.  Keep electrical cables out of the way.  Do not use floor polish or wax that makes floors slippery. If you have to use wax, make sure that it is non-skid floor wax.  Remove throw rugs and other tripping hazards from the floor. What can I do in the stairways?  Do not leave any items on the stairs.  Make sure that there are handrails on both sides of the stairs. Fix handrails that are broken or loose. Make sure that handrails are as long as the stairways.  Check any carpeting to make sure that it is firmly attached to the stairs. Fix any carpet that is loose or worn.  Avoid having throw rugs at the top or bottom of stairways, or secure the rugs with carpet tape to prevent them from moving.  Make sure that you have a light switch at the top of the stairs and the bottom of the stairs. If you do not have them, have them installed. What are some other fall prevention tips?  Wear closed-toe shoes that fit well and support your feet. Wear shoes that have rubber soles or low heels.  When you use a stepladder, make sure that it is completely opened and that  the sides are firmly locked. Have someone hold the ladder while you are using it. Do not climb a closed stepladder.  Add color or contrast paint or tape to grab bars and handrails in your home. Place contrasting color strips on the first and last steps.  Use mobility aids as needed, such as canes, walkers, scooters, and crutches.  Turn on lights if it is dark. Replace any light bulbs that burn out.  Set up furniture so  that there are clear paths. Keep the furniture in the same spot.  Fix any uneven floor surfaces.  Choose a carpet design that does not hide the edge of steps of a stairway.  Be aware of any and all pets.  Review your medicines with your healthcare provider. Some medicines can cause dizziness or changes in blood pressure, which increase your risk of falling. Talk with your health care provider about other ways that you can decrease your risk of falls. This may include working with a physical therapist or trainer to improve your strength, balance, and endurance. This information is not intended to replace advice given to you by your health care provider. Make sure you discuss any questions you have with your health care provider. Document Released: 09/28/2002 Document Revised: 03/06/2016 Document Reviewed: 11/12/2014 Elsevier Interactive Patient Education  [employer].

## 2018-06-11 ENCOUNTER — Other Ambulatory Visit: Payer: Self-pay | Admitting: Internal Medicine

## 2018-06-11 DIAGNOSIS — E782 Mixed hyperlipidemia: Secondary | ICD-10-CM

## 2018-06-11 NOTE — Telephone Encounter (Signed)
Rx sent to pharmacy   

## 2018-06-24 ENCOUNTER — Ambulatory Visit (INDEPENDENT_AMBULATORY_CARE_PROVIDER_SITE_OTHER): Payer: Medicare Other | Admitting: Family Medicine

## 2018-06-24 ENCOUNTER — Encounter: Payer: Self-pay | Admitting: Family Medicine

## 2018-06-24 VITALS — BP 120/70 | HR 80 | Temp 98.9°F | Ht 64.5 in | Wt 214.8 lb

## 2018-06-24 DIAGNOSIS — L568 Other specified acute skin changes due to ultraviolet radiation: Secondary | ICD-10-CM | POA: Diagnosis not present

## 2018-06-24 DIAGNOSIS — N3000 Acute cystitis without hematuria: Secondary | ICD-10-CM | POA: Diagnosis not present

## 2018-06-24 MED ORDER — METHYLPREDNISOLONE 4 MG PO TBPK: ORAL_TABLET | ORAL | 0 refills | Status: DC

## 2018-06-24 MED ORDER — NITROFURANTOIN MONOHYD MACRO 100 MG PO CAPS: 100.0000 mg | ORAL_CAPSULE | Freq: Two times a day (BID) | ORAL | 0 refills | Status: DC

## 2018-06-24 NOTE — Progress Notes (Signed)
   Subjective:    Patient ID: Natalie Hall, female    DOB: 12-09-1950, 67 y.o.   MRN: [id number]  HPI Here for 2 things. First for 2 days she has had an itchy rash on the arms and legs. [place] She has had similar sun rashes in the past. Using  Moisturizing lotion. Also for 2 days she has had urgency to urinate with burning. No fever. She is unable to produce a specimen today.    Review of Systems  Constitutional: Negative.   Respiratory: Negative.   Cardiovascular: Negative.   Genitourinary: Positive for dysuria, frequency and urgency.  Skin: Positive for rash.       Objective:   Physical Exam  Constitutional: She appears well-developed and well-nourished.  Cardiovascular: Normal rate, regular rhythm, normal heart sounds and intact distal pulses.  Pulmonary/Chest: Effort normal and breath sounds normal.  Abdominal: Soft. Bowel sounds are normal. She exhibits no distension and no mass. There is no tenderness. There is no rebound and no guarding. No hernia.  Skin:  Numerous patches of macular erythema on the extremities          Assessment & Plan:  Sun rash, given a Medrol dose pack. She also likely has a UTI so we will treat with Macrobid.  Alysia Penna, MD

## 2018-06-29 ENCOUNTER — Encounter: Payer: Self-pay | Admitting: Family Medicine

## 2018-07-01 NOTE — Telephone Encounter (Signed)
Call in Prednisone 10 mg to take 4 tabs a day for 3 days, then 3 a day for 3 days, then 2 a day for 3 days, then 1 a day for 3 days, then stop. Call in  #[phone] with no rf

## 2018-07-02 ENCOUNTER — Other Ambulatory Visit: Payer: Self-pay

## 2018-07-02 MED ORDER — PREDNISONE 10 MG PO TABS: ORAL_TABLET | ORAL | 0 refills | Status: DC

## 2018-07-02 NOTE — Telephone Encounter (Signed)
Copied from [place] [id number]. Topic: Quick Communication - See Telephone Encounter >> Jul 02, 2018  2:57 PM Hewitt Shorts wrote: Pharmacy  is calling to confirm which rx for the prednisone is correct The one that was verbally called in or the one that was e-prescribed    Best number [phone]

## 2018-07-02 NOTE — Telephone Encounter (Signed)
Call in Prednisone 10 mg to Call in  #[phone] with no rf

## 2018-07-03 ENCOUNTER — Other Ambulatory Visit: Payer: Self-pay

## 2018-07-03 MED ORDER — PREDNISONE 10 MG PO TABS: ORAL_TABLET | ORAL | 0 refills | Status: DC

## 2018-07-10 LAB — LIPID PANEL
Chol/HDL Ratio: 5.6 ratio — ABNORMAL HIGH (ref 0.0–4.4)
Cholesterol, Total: 157 mg/dL (ref 100–199)
HDL: 28 mg/dL — AB (ref >39)
LDL Calculated: 81 mg/dL (ref 0–99)
Triglycerides: 241 mg/dL — ABNORMAL HIGH (ref 0–149)
VLDL CHOLESTEROL CAL: 48 mg/dL — AB (ref 5–40)

## 2018-07-14 ENCOUNTER — Ambulatory Visit (INDEPENDENT_AMBULATORY_CARE_PROVIDER_SITE_OTHER): Payer: Medicare Other | Admitting: Internal Medicine

## 2018-07-14 ENCOUNTER — Encounter: Payer: Self-pay | Admitting: Internal Medicine

## 2018-07-14 VITALS — BP 128/72 | HR 94 | Ht 64.5 in | Wt 213.6 lb

## 2018-07-14 DIAGNOSIS — I10 Essential (primary) hypertension: Secondary | ICD-10-CM

## 2018-07-14 DIAGNOSIS — I2510 Atherosclerotic heart disease of native coronary artery without angina pectoris: Secondary | ICD-10-CM | POA: Diagnosis not present

## 2018-07-14 DIAGNOSIS — E785 Hyperlipidemia, unspecified: Secondary | ICD-10-CM

## 2018-07-14 DIAGNOSIS — E668 Other obesity: Secondary | ICD-10-CM | POA: Diagnosis not present

## 2018-07-14 MED ORDER — EZETIMIBE 10 MG PO TABS: 10.0000 mg | ORAL_TABLET | Freq: Every day | ORAL | 0 refills | Status: DC

## 2018-07-14 MED ORDER — EZETIMIBE 10 MG PO TABS: 10.0000 mg | ORAL_TABLET | Freq: Every day | ORAL | 3 refills | Status: DC

## 2018-07-14 NOTE — Patient Instructions (Signed)
Medication Instructions:   START zetia 10mg  daily  Labwork:  FASTING lab work in 3 months to check cholesterol   Testing/Procedures:  NONE  Follow-Up:  You have been referred to Dr. Dennard [facility]  [address]  [place], [place] [address]  [phone]    Your physician wants you to follow-up in: 6 months with Dr. Debara Pickett. You will receive a reminder email in [place]. Please call our office to schedule once you receive this email.    If you need a refill on your cardiac medications before your next appointment, please call your pharmacy.  Any Other Special Instructions Will Be Listed Below (If Applicable).

## 2018-07-14 NOTE — Progress Notes (Signed)
OFFICE NOTE  Chief Complaint:  Routine follow-up  Primary Care Physician: Laurey Morale, MD  HPI:  Natalie Hall is a 67 y.o. female who I last saw in the hospital a few weeks ago. She was admitted for an SVT which spontaneously converted in the ER on Cardizem and did not return. Unfortunately she had a non-ST elevation MI with troponin peak of 1.64. She underwent a Lexiscan Myoview which demonstrated a moderate region of reversibility in the mid and apical segments of the inferior lateral [place] consistent with ischemia. EF was 61%. She then was referred to cardiac catheterization which demonstrated the following:  Cardiac Cath 05/24   Prox RCA lesion, 25% stenosed.  The left ventricular systolic function is normal.  Prox LAD to Mid LAD lesion, 15% stenosed.  Mid RCA-1 lesion, 95% stenosed.  Mid RCA-2 lesion, 95% stenosed. Post intervention, there is a 0% residual stenosis.SYNERGY DES 3X32 1. Single vessel obstructive CAD 2. Normal LV function 3. Successful stenting of the mid to distal RCA with a DES, SYNERGY DES 3X32 Plan: DAPT for one year. Patient may be a candidate for the TWILIGHT trial in which case study protocol will be followed. Anticipate DC in am.  [name] returns today and is doing quite well. She denies any chest pain or worsening shortness of breath. She is compliant with her medications. She did enroll in the TWILIGHT trial. Unfortunately, she was considering shoulder surgery but this will be delayed due to the necessity of dual antiplatelet therapy with her recent stent. I advised her the Tylenol or her tramadol would be safe medicines used for pain.  07/13/2016  Since I last saw her, she was admitted to the hospital for adenosine-responsive AVNRT. She underwent successful catheter ablation and has not had recurrence. She denies any chest pain or dyspnea. She remains in the TWILIGHT trial with Brillinta. Recent lipid profilw shows LDL remains not at goal at  104. HDL low at 28. She is on Crestor 10 mg daily.  10/05/2016  [name] returns today for follow-up. She seems to be doing well from a coronary standpoint. She denies any chest pain or worsening dyspnea. 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 She underwent placement of a radioactive seed which was ultimately removed after a week and she is recovering from that procedure. She was off of her aspirin for 1 week but remained on Brilinta. She is still in the twilight trial. Her cholesterol was higher than it had been previously. I increased her Crestor to 20 mg daily and we are awaiting her lipid profile which was recently drawn. She denies any recurrent AVNRT.  06/10/2017  [name] was seen today in follow-up. She was seen in the [facility] for follow-up of the Twilight trial - this has completed. She is now more than one year since drug-eluting stent placement. She is angina free. At this point she will no longer need dual antiplatelet therapy and I advised that she go on aspirin 81 mg daily. Cholesterol is now at goal based on labs 3 months ago which showed total cholesterol 139, triglycerides 177, HL 34 and LDL-C is 69. She is on Crestor 40 mg daily. She is contemplating left shoulder surgery and from my standpoint she is clear for that.  12/30/2017  [name] was seen today in follow-up.  This is an annual follow-up.  Overall she is doing well.  She had a successful shoulder surgery and  has good range of motion in her left shoulder.  She has been under a lot of stress taking care of her mother-in-law who recently passed away.  She did have labs in December which showed total cholesterol 146, triglycerides 196, HDL 32 and LDL 75.  She does report she had some weight gain and decreased activity and I feel like her cholesterol could be better controlled with more vigilant diet.  She denies any recurrent chest pain or worsening shortness of  breath.  07/14/2018  [name] is seen today for routine follow-up. It has been 6 months with a plan for weight loss and dietary changes to get cholesterol to goal. Unfortunately, weight is not significantly different. Repeat lipid profile shows cholesterol is higher with TC 157, TG 241, HDL 28 a nd LDL 81. Her goal LDL is <70. I had not recommended additional medication due to her commitment to making dietary changes, however, at this point, she still has additional residual risk and her cholesterol is actually higher, so I'm recommending therapy.  PMHx:  Past Medical History:  Diagnosis Date  . Asthma    infrequent problem - no treatment since 2006  . Choroidal nevus of left eye    sees Dr. Gerarda Fraction at [facility].   . Coronary artery disease   . Dysrhythmia    SVT - ablation by Dr. Curt Bears on 05/21/2016 and episodes since  . GERD (gastroesophageal reflux disease)   . History of hiatal hernia    "very small"  . Hx of colonic polyp   . Hyperlipidemia   . Hypertension   . Melanoma of eye, left ([place])    sees Dr. Cordelia Pen  . Myocardial infarction ([place])   . Osteoarthritis   . Supraventricular tachycardia [facility])     Past Surgical History:  Procedure Laterality Date  . ABDOMINAL HYSTERECTOMY    . ACHILLES TENDON REPAIR  10-07-11   torn on right, per Dr. Noemi Chapel   . ANKLE SURGERY     torn tendon left ankle  . BREAST BIOPSY    . CARDIAC CATHETERIZATION N/A 03/14/2016   Procedure: Left Heart Cath and Coronary Angiography;  Surgeon: Peter M Martinique, MD;  Location: [place] [facility];  Service: Cardiovascular;  Laterality: N/A;  . CARDIAC CATHETERIZATION N/A 03/14/2016   Procedure: Coronary Stent Intervention;  Surgeon: Peter M Martinique, MD;  Location: [place] [facility];  Service: Cardiovascular;  Laterality: N/A;  . CARPAL TUNNEL RELEASE    . CATARACT EXTRACTION W/ INTRAOCULAR LENS IMPLANT Bilateral   . CHOLECYSTECTOMY    . COLONOSCOPY  06-23-14   per Dr. Ardis Hughs, adenomatous polyps,  repeat in 5 yrs    . ELECTROPHYSIOLOGIC STUDY N/A 05/21/2016   Procedure: SVT Ablation;  Surgeon: [name], MD;  Location: [place] [facility];  Service: Cardiovascular;  Laterality: N/A;  . EYE SURGERY Left 08/2016  . KNEE CLOSED REDUCTION Left 02/03/2014   Procedure: CLOSED MANIPULATION LEFT KNEE;  Surgeon: Gearlean Alf, MD;  Location: [facility];  Service: Orthopedics;  Laterality: Left;  . POLYPECTOMY    . REVERSE SHOULDER ARTHROPLASTY     Left  . REVERSE SHOULDER ARTHROPLASTY Left 06/28/2017   Procedure: LEFT SHOULDER REVERSE SHOULDER ARTHROPLASTY;  Surgeon: Netta Cedars, MD;  Location: [place];  Service: Orthopedics;  Laterality: Left;  . TOTAL KNEE ARTHROPLASTY Left 12/21/2013   Procedure: LEFT TOTAL KNEE ARTHROPLASTY;  Surgeon: Gearlean Alf, MD;  Location: [facility];  Service: Orthopedics;  Laterality: Left;  .  VARICOSE VEIN SURGERY      FAMHx:  Family History  Problem Relation Age of Onset  . Colon polyps Mother   . Colon cancer Maternal Aunt   . Liver cancer Maternal Aunt   . Breast cancer Unknown   . Coronary artery disease Unknown   . Colon cancer Unknown   . Diabetes Unknown   . Hypertension Unknown   . Kidney disease Unknown   . Lung cancer Unknown   . Lung cancer Brother        spleen, bone  . Esophageal cancer Neg Hx   . Stomach cancer Neg Hx   . Rectal cancer Neg Hx     SOCHx:   reports that she has never smoked. She has never used smokeless tobacco. She reports that she does not drink alcohol or use drugs.  ALLERGIES:  Allergies  Allergen Reactions  . Codeine Other (See Comments), Shortness Of Breath and Rash    flushing flushing   . Levofloxacin In D5w Other (See Comments)    Leg pain  Leg pain   . Amoxicillin-Pot Clavulanate Rash    Has patient had a PCN reaction causing immediate rash, facial/tongue/throat swelling, SOB or lightheadedness with hypotension: No Has patient had a PCN reaction causing severe rash involving mucus membranes or skin  necrosis: No Has patient had a PCN reaction that required hospitalization: No Has patient had a PCN reaction occurring within the last 10 years: Yes If all of the above answers are "NO", then may proceed with Cephalosporin use.  Has patient had a PCN reaction causing immediate rash, facial/tongue/throat swelling, SOB or lightheadedness with hypotension: No Has patient had a PCN reaction causing severe rash involving mucus membranes or skin necrosis: No Has patient had a PCN reaction that required hospitalization: No Has patient had a PCN reaction occurring within the last 10 years: Yes If all of the above answers are "NO", then may proceed with Cephalosporin use.  . Escitalopram Oxalate Rash    ROS: Pertinent items noted in HPI and remainder of comprehensive ROS otherwise negative.  HOME MEDS: Current Outpatient Medications  Medication Sig Dispense Refill  . aspirin EC 81 MG tablet Take 81 mg by mouth daily.    . Calcium-Magnesium-Vitamin D (CALCIUM 1200+D3 PO) Take 1 tablet by mouth daily.    . cholecalciferol (VITAMIN D) 1000 units tablet Take 1,000 Units by mouth daily.    . diphenhydramine-acetaminophen (TYLENOL PM) 25-500 MG TABS tablet Take 2 tablets by mouth at bedtime as needed (for pain.).    Marland Kitchen metoprolol succinate (TOPROL-XL) 25 MG 24 hr tablet TAKE 1 TABLET BY MOUTH  TWICE A DAY 180 tablet 0  . Multiple Vitamins-Minerals (MULTIVITAMIN PO) Take 1 tablet by mouth daily.     . nitroGLYCERIN (NITROSTAT) 0.4 MG SL tablet DISSOLVE 1 TABLET UNDER THE TONGUE EVERY 5 MINUTES FOR  3 DOSES AS NEEDED FOR CHEST PAIN 100 tablet 1  . omeprazole (PRILOSEC) 40 MG capsule TAKE 1 CAPSULE BY MOUTH  DAILY 90 capsule 1  . ramipril (ALTACE) 10 MG capsule TAKE 1 CAPSULE BY MOUTH  DAILY 90 capsule 0  . rosuvastatin (CRESTOR) 40 MG tablet TAKE 1 TABLET BY MOUTH  DAILY 90 tablet 0  . triamcinolone (NASACORT) 55 MCG/ACT nasal inhaler Place 2 sprays into the nose daily. 3 Inhaler 3   No current  facility-administered medications for this visit.     LABS/IMAGING: No results found for this or any previous visit (from the past 48 hour(s)). No results  found.  WEIGHTS: Wt Readings from Last 3 Encounters:  07/14/18 213 lb 9.6 oz (96.9 kg)  06/24/18 214 lb 12.8 oz (97.4 kg)  05/15/18 216 lb (98 kg)    VITALS: BP 128/72   Pulse 94   Ht 5' 4.5" (1.638 m)   Wt 213 lb 9.6 oz (96.9 kg)   LMP  (LMP Unknown)   SpO2 94%   BMI 36.10 kg/m   EXAM: General appearance: alert and no distress Neck: no carotid bruit and no JVD Lungs: clear to auscultation bilaterally Heart: regular rate and rhythm, S1, S2 normal, no murmur, click, rub or gallop Abdomen: soft, non-tender; bowel sounds normal; no masses,  no organomegaly Extremities: extremities normal, atraumatic, no cyanosis or edema Pulses: 2+ and symmetric Skin: Skin color, texture, turgor normal. No rashes or lesions Neurologic: Grossly normal Psych: Pleasant  EKG: Deferred  ASSESSMENT: 1. Coronary artery disease status post PCI with a drug-eluting stent to the mid/distal RCA (02/2016) 2. AVNRT - s/p catheter ablation in 04/2016 3. S/p NSTEMI with abnormal nuclear stress test 4. Dyslipidemia 5. Hypertension  PLAN: 1.   Mrs. Marzette was not able to achieve meaningful weight loss or dietary changes and her cholesterol is higher than previous. I'm recommending we add ezetimibe to her regimen. She will also need to strictly reduce saturated fat and calories. I think she would benefit from a weight management/nutrition referral. I'll refer her to Dr. Leafy Ro. Mrs. Kuriakose understands that due to high volume there may be a several month wait.  Follow-up with me in 6 months.  Pixie Casino, MD, [facility], [place] Director of the Advanced Lipid Disorders &  Cardiovascular Risk Reduction Clinic Diplomate of the American Board of Clinical Lipidology Attending Cardiologist  Direct Dial: [phone]  Fax:  [phone]  Website:  [email or web address] 07/14/2018, 9:05 AM

## 2018-07-22 ENCOUNTER — Encounter (INDEPENDENT_AMBULATORY_CARE_PROVIDER_SITE_OTHER): Payer: Self-pay

## 2018-07-28 ENCOUNTER — Encounter: Payer: Self-pay | Admitting: Family Medicine

## 2018-07-28 NOTE — Telephone Encounter (Signed)
Dr. Fry please advise. Thanks  

## 2018-07-29 NOTE — Telephone Encounter (Signed)
Tell her I highly recommend Dorothyann Peng who is a NP but he has special certification as a geriatric specialist. She should ask to see him

## 2018-07-30 ENCOUNTER — Other Ambulatory Visit: Payer: Self-pay | Admitting: Internal Medicine

## 2018-07-30 ENCOUNTER — Ambulatory Visit (INDEPENDENT_AMBULATORY_CARE_PROVIDER_SITE_OTHER): Payer: Self-pay | Admitting: Bariatrics

## 2018-07-30 ENCOUNTER — Other Ambulatory Visit: Payer: Self-pay | Admitting: Family Medicine

## 2018-07-30 DIAGNOSIS — I10 Essential (primary) hypertension: Secondary | ICD-10-CM

## 2018-07-30 DIAGNOSIS — E782 Mixed hyperlipidemia: Secondary | ICD-10-CM

## 2018-08-04 ENCOUNTER — Ambulatory Visit (INDEPENDENT_AMBULATORY_CARE_PROVIDER_SITE_OTHER): Payer: Medicare Other | Admitting: Bariatrics

## 2018-08-04 ENCOUNTER — Encounter (INDEPENDENT_AMBULATORY_CARE_PROVIDER_SITE_OTHER): Payer: Self-pay | Admitting: Bariatrics

## 2018-08-04 VITALS — BP 122/75 | HR 61 | Temp 97.7°F | Ht 60.0 in | Wt 213.0 lb

## 2018-08-04 DIAGNOSIS — E559 Vitamin D deficiency, unspecified: Secondary | ICD-10-CM

## 2018-08-04 DIAGNOSIS — Z0289 Encounter for other administrative examinations: Secondary | ICD-10-CM

## 2018-08-04 DIAGNOSIS — I25118 Atherosclerotic heart disease of native coronary artery with other forms of angina pectoris: Secondary | ICD-10-CM

## 2018-08-04 DIAGNOSIS — I10 Essential (primary) hypertension: Secondary | ICD-10-CM

## 2018-08-04 DIAGNOSIS — Z1331 Encounter for screening for depression: Secondary | ICD-10-CM

## 2018-08-04 DIAGNOSIS — Z6836 Body mass index (BMI) 36.0-36.9, adult: Secondary | ICD-10-CM | POA: Diagnosis not present

## 2018-08-04 DIAGNOSIS — E781 Pure hyperglyceridemia: Secondary | ICD-10-CM

## 2018-08-04 DIAGNOSIS — F3289 Other specified depressive episodes: Secondary | ICD-10-CM

## 2018-08-04 DIAGNOSIS — R5383 Other fatigue: Secondary | ICD-10-CM

## 2018-08-04 DIAGNOSIS — R0602 Shortness of breath: Secondary | ICD-10-CM

## 2018-08-05 ENCOUNTER — Other Ambulatory Visit: Payer: Self-pay | Admitting: Internal Medicine

## 2018-08-05 LAB — T3: T3 TOTAL: 126 ng/dL (ref 71–180)

## 2018-08-05 LAB — COMPREHENSIVE METABOLIC PANEL
ALK PHOS: 56 IU/L (ref 39–117)
ALT: 25 IU/L (ref 0–32)
AST: 22 IU/L (ref 0–40)
Albumin/Globulin Ratio: 1.5 (ref 1.2–2.2)
Albumin: 4.3 g/dL (ref 3.6–4.8)
BUN/Creatinine Ratio: 25 (ref 12–28)
BUN: 17 mg/dL (ref 8–27)
Bilirubin Total: 0.3 mg/dL (ref 0.0–1.2)
CO2: 23 mmol/L (ref 20–29)
CREATININE: 0.68 mg/dL (ref 0.57–1.00)
Calcium: 9.1 mg/dL (ref 8.7–10.3)
Chloride: 100 mmol/L (ref 96–106)
GFR calc Af Amer: 104 mL/min/{1.73_m2} (ref >59)
GFR calc non Af Amer: 90 mL/min/{1.73_m2} (ref >59)
GLOBULIN, TOTAL: 2.8 g/dL (ref 1.5–4.5)
GLUCOSE: 90 mg/dL (ref 65–99)
Potassium: 4.8 mmol/L (ref 3.5–5.2)
SODIUM: 138 mmol/L (ref 134–144)
Total Protein: 7.1 g/dL (ref 6.0–8.5)

## 2018-08-05 LAB — HEMOGLOBIN A1C
ESTIMATED AVERAGE GLUCOSE: 123 mg/dL
HEMOGLOBIN A1C: 5.9 % — AB (ref 4.8–5.6)

## 2018-08-05 LAB — T4, FREE: Free T4: 1.22 ng/dL (ref 0.82–1.77)

## 2018-08-05 LAB — TSH: TSH: 1.12 u[IU]/mL (ref 0.450–4.500)

## 2018-08-05 LAB — VITAMIN D 25 HYDROXY (VIT D DEFICIENCY, FRACTURES): Vit D, 25-Hydroxy: 43.5 ng/mL (ref 30.0–100.0)

## 2018-08-05 LAB — INSULIN, RANDOM: INSULIN: 33.5 u[IU]/mL — AB (ref 2.6–24.9)

## 2018-08-05 NOTE — Progress Notes (Signed)
.  Office: [phone]  /  Fax: [phone]   Dear Dr. Debara Pickett,   Thank you for referring Natalie Hall to our clinic. The following note includes my evaluation and treatment recommendations.  HPI:   Chief Complaint: OBESITY    Natalie Hall has been referred by Pixie Casino, MD for consultation regarding her obesity and obesity related comorbidities.    Natalie Hall (MR# [id number]) is a 67 y.o. female who presents on 08/05/2018 for obesity evaluation and treatment. Current BMI is Body mass index is 41.6 kg/m.Natalie Hall has been struggling with her weight for many years and has been unsuccessful in either losing weight, maintaining weight loss, or reaching her healthy weight goal. Natalie Hall is snacking on ice cream and cookies. 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     Natalie Hall attended our information session and states she is currently in the action stage of change and ready to dedicate time achieving and maintaining a healthier weight. Natalie Hall is interested in becoming our patient and working on intensive lifestyle modifications including (but not limited to) diet, exercise and weight loss.    Natalie Hall states her family eats meals together she thinks her family will eat healthier with  her she struggles with family and or coworkers weight loss sabotage her desired weight loss is 60 to 70 lbs she has been heavy most of  her life her heaviest weight ever was 240 lbs. she has significant food cravings issues  she is frequently drinking liquids with calories she frequently makes poor food choices she frequently eats larger portions than normal  she struggles with emotional eating    Fatigue Natalie Hall feels her energy is lower than it should be. This has worsened with weight gain and has not worsened recently. Natalie Hall admits to daytime somnolence and  admits to waking up still tired. Patient is at risk for obstructive sleep apnea. Patent has a history of  symptoms of daytime fatigue, morning fatigue and hypertension. Patient generally gets 8 or 9 hours of sleep per night, and states they generally have restful sleep. Snoring is present. Apneic episodes are not present. Epworth Sleepiness Score is 11  Dyspnea on exertion Natalie Hall notes increasing shortness of breath with exercising and seems to be worsening over time with weight gain. She notes getting out of breath sooner with activity than she used to. This has not gotten worse recently. Olayinka has a history of asthma and she does not use a rescue inhaler. Shawny denies orthopnea.  Elevated Triglycerides (Hypertriglyceridemia) Natalie Hall has hypertriglyceridemia and she is currently taking zetia. Natalie Hall is attempting to improve her cholesterol levels with intensive lifestyle modification including a low saturated fat diet, exercise and weight loss. She denies myalgias.  CAD (coronary artery disease) Natalie Hall has a history of SVT and she has a stent. She has a history of myocardial infarction in May 2017 and she had cardiac rehab. She sees a cardiologist every six months.  Hypertension Natalie Hall is a 67 y.o. female with hypertension. Natalie Hall denies chest pain. She is working weight loss to help control her blood pressure with the goal of decreasing her risk of heart attack and stroke. Natalie Hall blood pressure is well controlled on medications.  Depression with emotional eating behaviors Natalie Hall is struggling with emotional eating and using food for comfort to the extent that it is negatively impacting her health. She often snacks when she is not hungry. Natalie Hall sometimes feels she is out  of control and then feels guilty that she made poor food choices. She is attempting to work on behavior modification techniques to help reduce her emotional eating. She denies hunger. She shows no sign of suicidal or homicidal ideations.  Depression screen Natalie Hall [facility] 2/9 08/04/2018 05/15/2018 05/14/2017 08/28/2016 05/28/2016    Decreased Interest 1 0 0 0 0  Down, Depressed, Hopeless 3 0 0 0 0  PHQ - 2 Score 4 0 0 0 0  Altered sleeping 1 - - - -  Tired, decreased energy 3 - - - -  Change in appetite 3 - - - -  Feeling bad or failure about yourself  3 - - - -  Trouble concentrating 2 - - - -  Moving slowly or fidgety/restless 0 - - - -  Suicidal thoughts 0 - - - -  PHQ-9 Score 16 - - - -  Difficult doing work/chores Very difficult - - - -  Some recent data might be hidden     Depression Screen Natalie Hall's Food and Mood (modified PHQ-9) score was  Depression screen PHQ 2/9 08/04/2018  Decreased Interest 1  Down, Depressed, Hopeless 3  PHQ - 2 Score 4  Altered sleeping 1  Tired, decreased energy 3  Change in appetite 3  Feeling bad or failure about yourself  3  Trouble concentrating 2  Moving slowly or fidgety/restless 0  Suicidal thoughts 0  PHQ-9 Score 16  Difficult doing work/chores Very difficult  Some recent data might be hidden    ALLERGIES: Allergies  Allergen Reactions  . Codeine Other (See Comments), Shortness Of Breath and Rash    flushing flushing   . Levofloxacin In D5w Other (See Comments)    Leg pain  Leg pain   . Amoxicillin   . Amoxicillin-Pot Clavulanate Rash    Has patient had a PCN reaction causing immediate rash, facial/tongue/throat swelling, SOB or lightheadedness with hypotension: No Has patient had a PCN reaction causing severe rash involving mucus membranes or skin necrosis: No Has patient had a PCN reaction that required hospitalization: No Has patient had a PCN reaction occurring within the last 10 years: Yes If all of the above answers are "NO", then may proceed with Cephalosporin use.  Has patient had a PCN reaction causing immediate rash, facial/tongue/throat swelling, SOB or lightheadedness with hypotension: No Has patient had a PCN reaction causing severe rash involving mucus membranes or skin necrosis: No Has patient had a PCN reaction that required  hospitalization: No Has patient had a PCN reaction occurring within the last 10 years: Yes If all of the above answers are "NO", then may proceed with Cephalosporin use.  . Escitalopram Oxalate Rash    MEDICATIONS: Current Outpatient Medications on File Prior to Visit  Medication Sig Dispense Refill  . acetaminophen (TYLENOL) 325 MG tablet Take 500 mg by mouth every 6 (six) hours as needed.    Marland Kitchen aspirin EC 81 MG tablet Take 81 mg by mouth daily.    . Calcium-Magnesium-Vitamin D (CALCIUM 1200+D3 PO) Take 1 tablet by mouth daily.    . cholecalciferol (VITAMIN D) 1000 units tablet Take 1,000 Units by mouth daily.    . diphenhydramine-acetaminophen (TYLENOL PM) 25-500 MG TABS tablet Take 2 tablets by mouth at bedtime as needed (for pain.).    Marland Kitchen metoprolol succinate (TOPROL-XL) 25 MG 24 hr tablet TAKE 1 TABLET BY MOUTH  TWICE A DAY 180 tablet 2  . Multiple Vitamins-Minerals (MULTIVITAMIN PO) Take 1 tablet by mouth daily.     Marland Kitchen  nitroGLYCERIN (NITROSTAT) 0.4 MG SL tablet DISSOLVE 1 TABLET UNDER THE TONGUE EVERY 5 MINUTES FOR  3 DOSES AS NEEDED FOR CHEST PAIN 100 tablet 1  . omeprazole (PRILOSEC) 40 MG capsule TAKE 1 CAPSULE BY MOUTH  DAILY 90 capsule 1  . ramipril (ALTACE) 10 MG capsule TAKE 1 CAPSULE BY MOUTH  DAILY 90 capsule 2  . rosuvastatin (CRESTOR) 40 MG tablet TAKE 1 TABLET BY MOUTH  DAILY 90 tablet 2  . triamcinolone (NASACORT) 55 MCG/ACT nasal inhaler Place 2 sprays into the nose daily. 3 Inhaler 3   No current facility-administered medications on file prior to visit.     PAST MEDICAL HISTORY: Past Medical History:  Diagnosis Date  . Asthma    infrequent problem - no treatment since 2006  . Back pain   . Choroidal nevus of left eye    sees Dr. Gerarda Fraction at [facility]   . Constipation   . Coronary artery disease   . Dysrhythmia    SVT - ablation by Dr. Curt Bears on 05/21/2016 and episodes since  . Fatty liver   . GERD (gastroesophageal reflux disease)   . History of  hiatal hernia    "very small"  . Hx of colonic polyp   . Hyperlipidemia   . Hypertension   . Joint pain   . Melanoma of eye, left ([place])    sees Dr. Cordelia Pen  . Myocardial infarction ([place])   . Osteoarthritis   . Shortness of breath   . Supraventricular tachycardia ([place])     PAST SURGICAL HISTORY: Past Surgical History:  Procedure Laterality Date  . ABDOMINAL HYSTERECTOMY    . ACHILLES TENDON REPAIR  10-07-11   torn on right, per Dr. Noemi Chapel   . ANKLE SURGERY     torn tendon left ankle  . BREAST BIOPSY    . CARDIAC CATHETERIZATION N/A 03/14/2016   Procedure: Left Heart Cath and Coronary Angiography;  Surgeon: Peter M Martinique, MD;  Location: [place] [facility];  Service: Cardiovascular;  Laterality: N/A;  . CARDIAC CATHETERIZATION N/A 03/14/2016   Procedure: Coronary Stent Intervention;  Surgeon: Peter M Martinique, MD;  Location: [place] [facility];  Service: Cardiovascular;  Laterality: N/A;  . CARPAL TUNNEL RELEASE    . CATARACT EXTRACTION W/ INTRAOCULAR LENS IMPLANT Bilateral   . CHOLECYSTECTOMY    . COLONOSCOPY  06-23-14   per Dr. Ardis Hughs, adenomatous polyps, repeat in 5 yrs    . ELECTROPHYSIOLOGIC STUDY N/A 05/21/2016   Procedure: SVT Ablation;  Surgeon: [name], MD;  Location: [place] [facility];  Service: Cardiovascular;  Laterality: N/A;  . EYE SURGERY Left 08/2016  . KNEE CLOSED REDUCTION Left 02/03/2014   Procedure: CLOSED MANIPULATION LEFT KNEE;  Surgeon: Gearlean Alf, MD;  Location: [facility];  Service: Orthopedics;  Laterality: Left;  . POLYPECTOMY    . REVERSE SHOULDER ARTHROPLASTY     Left  . REVERSE SHOULDER ARTHROPLASTY Left 06/28/2017   Procedure: LEFT SHOULDER REVERSE SHOULDER ARTHROPLASTY;  Surgeon: Netta Cedars, MD;  Location: [place];  Service: Orthopedics;  Laterality: Left;  . TOTAL KNEE ARTHROPLASTY Left 12/21/2013   Procedure: LEFT TOTAL KNEE ARTHROPLASTY;  Surgeon: Gearlean Alf, MD;  Location: [facility];  Service: Orthopedics;  Laterality: Left;  Marland Kitchen  VARICOSE VEIN SURGERY      SOCIAL HISTORY: Social History   Tobacco Use  . Smoking status: Never Smoker  . Smokeless tobacco: Never Used  Substance Use Topics  . Alcohol use: No  Alcohol/week: 0.0 standard drinks  . Drug use: No    FAMILY HISTORY: Family History  Problem Relation Age of Onset  . Colon polyps Mother   . Diabetes Mother   . High blood pressure Mother   . High Cholesterol Mother   . Thyroid disease Mother   . Anxiety disorder Mother   . Colon cancer Maternal Aunt   . Liver cancer Maternal Aunt   . Breast cancer Unknown   . Coronary artery disease Unknown   . Colon cancer Unknown   . Diabetes Unknown   . Hypertension Unknown   . Kidney disease Unknown   . Lung cancer Unknown   . Lung cancer Brother        spleen, bone  . Diabetes Father   . High blood pressure Father   . High Cholesterol Father   . Heart disease Father   . Depression Father   . Obesity Father   . Esophageal cancer Neg Hx   . Stomach cancer Neg Hx   . Rectal cancer Neg Hx     ROS: Review of Systems  Constitutional: Positive for malaise/fatigue.  HENT: Positive for tinnitus.        + Hay Fever  Eyes:       + Wear Glasses (for reading) + Floaters  Respiratory: Positive for shortness of breath (on exertion).   Cardiovascular: Negative for chest pain and orthopnea.       + Leg Cramping + Calf/Leg Pain with Walking  Musculoskeletal: Positive for back pain. Negative for myalgias.       + Muscle or Joint Pain + Muscle Stiffness  Skin: Positive for itching and rash.       + Dryness  Endo/Heme/Allergies: Bruises/bleeds easily.       Negative for polyphagia  Psychiatric/Behavioral: Positive for depression. Negative for suicidal ideas. The patient has insomnia.        + Stress    PHYSICAL EXAM: Blood pressure 122/75, pulse 61, temperature 97.7 F (36.5 C), temperature source Oral, height 5' (1.524 m), weight 213 lb (96.6 kg), SpO2 99 %. Body mass index is 41.6  kg/m. Physical Exam  Constitutional: She is oriented to person, place, and time. She appears well-developed and well-nourished.  HENT:  Head: Normocephalic and atraumatic.  Nose: Nose normal.  M[place]anpati = 4  Eyes: EOM are normal. No scleral icterus.  Neck: Normal range of motion. Neck supple. No thyromegaly present.  Cardiovascular: Normal rate and regular rhythm.  Pulmonary/Chest: No respiratory distress.  Abdominal: Soft. There is no tenderness.  Musculoskeletal: Normal range of motion. She exhibits edema (trace edema bilateral lower extremities).  Neurological: She is alert and oriented to person, place, and time. Coordination normal.  Skin: Skin is warm and dry.  Psychiatric: She has a normal mood and affect. She expresses no homicidal and no suicidal ideation.  Vitals reviewed.   RECENT LABS AND TESTS: BMET    Component Value Date/Time   NA 138 08/04/2018 0921   K 4.8 08/04/2018 0921   CL 100 08/04/2018 0921   CO2 23 08/04/2018 0921   GLUCOSE 90 08/04/2018 0921   GLUCOSE 91 03/11/2018 0904   BUN 17 08/04/2018 0921   CREATININE 0.68 08/04/2018 0921   CREATININE 0.59 10/01/2016 0939   CALCIUM 9.1 08/04/2018 0921   GFRNONAA 90 08/04/2018 0921   GFRAA 104 08/04/2018 0921   Lab Results  Component Value Date   HGBA1C 5.9 (H) 08/04/2018   Lab Results  Component Value Date  INSULIN 33.5 (H) 08/04/2018   CBC    Component Value Date/Time   WBC 6.1 03/11/2018 0904   RBC 4.66 03/11/2018 0904   HGB 12.9 03/11/2018 0904   HCT 38.4 03/11/2018 0904   PLT 250.0 03/11/2018 0904   MCV 82.3 03/11/2018 0904   MCH 28.7 06/25/2017 1140   MCHC 33.6 03/11/2018 0904   RDW 15.7 (H) 03/11/2018 0904   LYMPHSABS 2.1 03/11/2018 0904   MONOABS 0.5 03/11/2018 0904   EOSABS 0.3 03/11/2018 0904   BASOSABS 0.0 03/11/2018 0904   Iron/TIBC/Ferritin/ %Sat No results found for: IRON, TIBC, FERRITIN, IRONPCTSAT Lipid Panel     Component Value Date/Time   CHOL 157 07/10/2018 1055    TRIG 241 (H) 07/10/2018 1055   HDL 28 (L) 07/10/2018 1055   CHOLHDL 5.6 (H) 07/10/2018 1055   CHOLHDL 5 03/11/2018 0904   VLDL 46.8 (H) 03/11/2018 0904   LDLCALC 81 07/10/2018 1055   LDLDIRECT 96.0 03/11/2018 0904   Hepatic Function Panel     Component Value Date/Time   PROT 7.1 08/04/2018 0921   ALBUMIN 4.3 08/04/2018 0921   AST 22 08/04/2018 0921   ALT 25 08/04/2018 0921   ALKPHOS 56 08/04/2018 0921   BILITOT 0.3 08/04/2018 0921   BILIDIR 0.1 03/11/2018 0904   IBILI 0.3 07/09/2016 0905      Component Value Date/Time   TSH 1.120 08/04/2018 0921   TSH 1.49 03/11/2018 0904   TSH 1.64 02/11/2017 0851    ECG  shows NSR with a rate of 62 BPM INDIRECT CALORIMETER done today shows a VO2 of 242 and a REE of 1686.  Her calculated basal metabolic rate is 7341 thus her basal metabolic rate is better than expected.    ASSESSMENT AND PLAN: Other fatigue - Plan: EKG 12-Lead, Hemoglobin A1c, Insulin, random, T3, T4, free, TSH  Shortness of breath on exertion  Hypertriglyceridemia  Coronary artery disease of native artery of native heart with stable angina pectoris (HCC)  Essential hypertension - Plan: Comprehensive metabolic panel  Vitamin D deficiency - Plan: VITAMIN D 25 Hydroxy (Vit-D Deficiency, Fractures)  Other depression - with emotional eating  Screening for depression  Class 2 severe obesity with serious comorbidity and body mass index (BMI) of 36.0 to 36.9 in adult, unspecified obesity type ([place])  PLAN: Fatigue Chesney was informed that her fatigue may be related to obesity, depression or many other causes. Labs will be ordered, and in the meanwhile Verenis has agreed to work on diet, exercise and weight loss to help with fatigue. Proper sleep hygiene was discussed including the need for 7-8 hours of quality sleep each night. A sleep study was not ordered based on symptoms and Epworth score.  Dyspnea on exertion Rodneshia's shortness of breath appears to be obesity  related and exercise induced. She has agreed to work on weight loss and gradually increase exercise over time to treat her exercise induced shortness of breath. If Quantavia follows our instructions and loses weight without improvement of her shortness of breath, we will plan to refer to pulmonology. We will monitor this condition regularly. Kya agrees to this plan.  Elevated Triglycerides (Hypertriglyceridemia) Milka was informed of the American Heart Association Guidelines emphasizing intensive lifestyle modifications as the first line treatment for hypertriglyceridemia. We discussed many lifestyle modifications today in depth, and Jahnyla will work on decreasing carbohydrates, saturated fats such as fatty red meat, butter and many fried foods. She will also increase vegetables and lean protein in her diet and continue  to work on exercise and weight loss efforts. Shenicka will continue zetia and follow up as directed.  CAD (coronary artery disease) Laniqua will continue to see her cardiologist every six months. She will follow up with our clinic in 2 weeks.  Hypertension We discussed sodium restriction, working on healthy weight loss, and a regular exercise program as the means to achieve improved blood pressure control. Jerolyn agreed with this plan and agreed to follow up as directed. We will continue to monitor her blood pressure as well as her progress with the above lifestyle modifications. She will continue her medications as prescribed and will watch for signs of hypotension as she continues her lifestyle modifications.  Depression with Emotional Eating Behaviors We discussed behavior modification techniques today to help Lashawndra deal with her emotional eating and depression. Teriann will work on decreasing stress eating. We will refer to Dr. Mallie Mussel our bariatric psychologist and Ambera will follow up with our clinic at the agreed upon time.  Depression Screen Kyree had a strongly positive  depression screening. Depression is commonly associated with obesity and often results in emotional eating behaviors. We will monitor this closely and work on CBT to help improve the non-hunger eating patterns. Referral to Psychology may be required if no improvement is seen as she continues in our clinic.  Obesity Janashia is currently in the action stage of change and her goal is to continue with weight loss efforts. I recommend Elvina begin the structured treatment plan as follows:  She has agreed to follow the Category 2 plan Tamina has been instructed to eventually work up to a goal of 150 minutes of combined cardio and strengthening exercise per week for weight loss and overall health benefits. We discussed the following Behavioral Modification Strategies today: increase H2O intake, keeping healthy foods in the home, better snacking choices, increasing lean protein intake, decreasing simple carbohydrates , increasing vegetables, work on meal planning and easy cooking plans, dealing with family or coworker sabotage and emotional eating strategies   She was informed of the importance of frequent follow up visits to maximize her success with intensive lifestyle modifications for her multiple health conditions. She was informed we would discuss her lab results at her next visit unless there is a critical issue that needs to be addressed sooner. Ewelina agreed to keep her next visit at the agreed upon time to discuss these results.    OBESITY BEHAVIORAL INTERVENTION VISIT  Today's visit was # 1   Starting weight: 213 lbs Starting date: 08/04/18 Today's weight : 213 lbs  Today's date: 08/04/2018 Total lbs lost to date: 0 At least 15 minutes were spent on discussing the following behavioral intervention visit.   ASK: We discussed the diagnosis of obesity with [name] today and Wladyslawa agreed to give [name] permission to discuss obesity behavioral modification therapy today.  ASSESS: Elley  has the diagnosis of obesity and her BMI today is 41.6 Tahani is in the action stage of change   ADVISE: Amaliya was educated on the multiple health risks of obesity as well as the benefit of weight loss to improve her health. She was advised of the need for long term treatment and the importance of lifestyle modifications to improve her current health and to decrease her risk of future health problems.  AGREE: Multiple dietary modification options and treatment options were discussed and  Rolene agreed to follow the recommendations documented in the above note.  ARRANGE: Brecklyn was educated on the importance of frequent  visits to treat obesity as outlined per CMS and USPSTF guidelines and agreed to schedule her next follow up appointment today.  Corey Skains, am acting as Location manager for [employer]. Owens Shark, DO  I have reviewed the above documentation for accuracy and completeness, and I agree with the above. -Jearld Lesch, DO

## 2018-08-18 ENCOUNTER — Encounter (INDEPENDENT_AMBULATORY_CARE_PROVIDER_SITE_OTHER): Payer: Self-pay | Admitting: Bariatrics

## 2018-08-18 ENCOUNTER — Ambulatory Visit (INDEPENDENT_AMBULATORY_CARE_PROVIDER_SITE_OTHER): Payer: Medicare Other | Admitting: Bariatrics

## 2018-08-18 ENCOUNTER — Ambulatory Visit (INDEPENDENT_AMBULATORY_CARE_PROVIDER_SITE_OTHER): Payer: Medicare Other

## 2018-08-18 VITALS — BP 131/71 | HR 68 | Temp 97.9°F | Ht 64.0 in | Wt 206.0 lb

## 2018-08-18 DIAGNOSIS — E559 Vitamin D deficiency, unspecified: Secondary | ICD-10-CM | POA: Diagnosis not present

## 2018-08-18 DIAGNOSIS — I10 Essential (primary) hypertension: Secondary | ICD-10-CM | POA: Diagnosis not present

## 2018-08-18 DIAGNOSIS — Z6835 Body mass index (BMI) 35.0-35.9, adult: Secondary | ICD-10-CM | POA: Diagnosis not present

## 2018-08-18 DIAGNOSIS — Z9189 Other specified personal risk factors, not elsewhere classified: Secondary | ICD-10-CM | POA: Diagnosis not present

## 2018-08-18 DIAGNOSIS — Z23 Encounter for immunization: Secondary | ICD-10-CM

## 2018-08-18 DIAGNOSIS — R7303 Prediabetes: Secondary | ICD-10-CM

## 2018-08-19 DIAGNOSIS — Z23 Encounter for immunization: Secondary | ICD-10-CM | POA: Diagnosis not present

## 2018-08-19 NOTE — Progress Notes (Signed)
Office: [phone]  /  Fax: [phone]   HPI:   Chief Complaint: OBESITY Natalie Hall is here to discuss her progress with her obesity treatment plan. She is following the Category 2 plan and is following her eating plan approximately 100 % of the time. She states she is exercising 0 minutes 0 times per week. Laquinda went to the beach and she still lost weight. She was not able to get in all the snacks. Imanie is having no inappropriate hunger or significant cravings.   Her weight is 206 lb (93.4 kg) today and 7 since her last visit. She has lost 7 lbs since starting treatment with [name].  Hypertension Natalie Hall is Natalie Hall 67 y.o. female with hypertension.  Natalie Hall denies chest pain, lightheadedness or shortness of breath on exertion. She is working weight loss to help control her blood pressure with the goal of decreasing her risk of heart attack and stroke. Natalie Hall blood pressure is currently well controlled.  Vitamin D deficiency Natalie Hall has Natalie Hall diagnosis of vitamin D deficiency. She is currently taking OTC Vit D3 and Calcium supplement. Natalie Hall denies nausea, vomiting or muscle weakness.  Pre-Diabetes Natalie Hall has Natalie Hall diagnosis of prediabetes based on her elevated HgA1c of 5.9% and Insulin at 33.5. She was informed this puts her at greater risk of developing diabetes. She is not taking metformin currently and continues to work on diet and exercise to decrease risk of diabetes. She denies nausea, polyphagia, polyuria, polydipsia or hypoglycemia.   ALLERGIES: Allergies  Allergen Reactions  . Codeine Other (See Comments), Shortness Of Breath and Rash    flushing flushing   . Levofloxacin In D5w Other (See Comments)    Leg pain  Leg pain   . Amoxicillin   . Amoxicillin-Pot Clavulanate Rash    Has patient had Natalie Hall PCN reaction causing immediate rash, facial/tongue/throat swelling, SOB or lightheadedness with hypotension: No Has patient had Natalie Hall PCN reaction causing severe rash involving mucus  membranes or skin necrosis: No Has patient had Natalie Hall PCN reaction that required hospitalization: No Has patient had Natalie Hall PCN reaction occurring within the last 10 years: Yes If all of the above answers are "NO", then may proceed with Cephalosporin use.  Has patient had Natalie Hall PCN reaction causing immediate rash, facial/tongue/throat swelling, SOB or lightheadedness with hypotension: No Has patient had Natalie Hall PCN reaction causing severe rash involving mucus membranes or skin necrosis: No Has patient had Natalie Hall PCN reaction that required hospitalization: No Has patient had Natalie Hall PCN reaction occurring within the last 10 years: Yes If all of the above answers are "NO", then may proceed with Cephalosporin use.  . Escitalopram Oxalate Rash    MEDICATIONS: Current Outpatient Medications on File Prior to Visit  Medication Sig Dispense Refill  . acetaminophen (TYLENOL) 325 MG tablet Take 500 mg by mouth every 6 (six) hours as needed.    Natalie Hall Kitchen aspirin EC 81 MG tablet Take 81 mg by mouth daily.    . Calcium-Magnesium-Vitamin D (CALCIUM 1200+D3 PO) Take 1 tablet by mouth daily.    . cholecalciferol (VITAMIN D) 1000 units tablet Take 1,000 Units by mouth daily.    . diphenhydramine-acetaminophen (TYLENOL PM) 25-500 MG TABS tablet Take 2 tablets by mouth at bedtime as needed (for pain.).    Natalie Hall Kitchen ezetimibe (ZETIA) 10 MG tablet TAKE 1 TABLET BY MOUTH EVERY DAY 30 tablet 0  . metoprolol succinate (TOPROL-XL) 25 MG 24 hr tablet TAKE 1 TABLET BY MOUTH  TWICE Natalie Hall DAY 180 tablet 2  .  Multiple Vitamins-Minerals (MULTIVITAMIN PO) Take 1 tablet by mouth daily.     . nitroGLYCERIN (NITROSTAT) 0.4 MG SL tablet DISSOLVE 1 TABLET UNDER THE TONGUE EVERY 5 MINUTES FOR  3 DOSES AS NEEDED FOR CHEST PAIN 100 tablet 1  . omeprazole (PRILOSEC) 40 MG capsule TAKE 1 CAPSULE BY MOUTH  DAILY 90 capsule 1  . ramipril (ALTACE) 10 MG capsule TAKE 1 CAPSULE BY MOUTH  DAILY 90 capsule 2  . rosuvastatin (CRESTOR) 40 MG tablet TAKE 1 TABLET BY MOUTH  DAILY 90 tablet 2   . triamcinolone (NASACORT) 55 MCG/ACT nasal inhaler Place 2 sprays into the nose daily. 3 Inhaler 3   No current facility-administered medications on file prior to visit.     PAST MEDICAL HISTORY: Past Medical History:  Diagnosis Date  . Asthma    infrequent problem - no treatment since 2006  . Back pain   . Choroidal nevus of left eye    sees Dr. Gerarda Fraction at [facility].   . Constipation   . Coronary artery disease   . Dysrhythmia    SVT - ablation by Dr. Curt Bears on 05/21/2016 and episodes since  . Fatty liver   . GERD (gastroesophageal reflux disease)   . History of hiatal hernia    "very small"  . Hx of colonic polyp   . Hyperlipidemia   . Hypertension   . Joint pain   . Melanoma of eye, left ([place])    sees Dr. Cordelia Pen  . Myocardial infarction ([place])   . Osteoarthritis   . Shortness of breath   . Supraventricular tachycardia ([place])     PAST SURGICAL HISTORY: Past Surgical History:  Procedure Laterality Date  . ABDOMINAL HYSTERECTOMY    . ACHILLES TENDON REPAIR  10-07-11   torn on right, per Dr. Noemi Chapel   . ANKLE SURGERY     torn tendon left ankle  . BREAST BIOPSY    . CARDIAC CATHETERIZATION N/Natalie Hall 03/14/2016   Procedure: Left Heart Cath and Coronary Angiography;  Surgeon: Peter M Martinique, MD;  Location: [facility];  Service: Cardiovascular;  Laterality: N/Natalie Hall;  . CARDIAC CATHETERIZATION N/Natalie Hall 03/14/2016   Procedure: Coronary Stent Intervention;  Surgeon: Peter M Martinique, MD;  Location: [facility];  Service: Cardiovascular;  Laterality: N/Natalie Hall;  . CARPAL TUNNEL RELEASE    . CATARACT EXTRACTION W/ INTRAOCULAR LENS IMPLANT Bilateral   . CHOLECYSTECTOMY    . COLONOSCOPY  06-23-14   per Dr. Ardis Hughs, adenomatous polyps, repeat in 5 yrs    . ELECTROPHYSIOLOGIC STUDY N/Natalie Hall 05/21/2016   Procedure: SVT Ablation;  Surgeon: [name], MD;  Location: [place];  Service: Cardiovascular;  Laterality: N/Natalie Hall;  . EYE SURGERY Left 08/2016  . KNEE CLOSED  REDUCTION Left 02/03/2014   Procedure: CLOSED MANIPULATION LEFT KNEE;  Surgeon: Gearlean Alf, MD;  Location: [facility];  Service: Orthopedics;  Laterality: Left;  . POLYPECTOMY    . REVERSE SHOULDER ARTHROPLASTY     Left  . REVERSE SHOULDER ARTHROPLASTY Left 06/28/2017   Procedure: LEFT SHOULDER REVERSE SHOULDER ARTHROPLASTY;  Surgeon: Netta Cedars, MD;  Location: [facility];  Service: Orthopedics;  Laterality: Left;  . TOTAL KNEE ARTHROPLASTY Left 12/21/2013   Procedure: LEFT TOTAL KNEE ARTHROPLASTY;  Surgeon: Gearlean Alf, MD;  Location: [facility];  Service: Orthopedics;  Laterality: Left;  Natalie Hall Kitchen VARICOSE VEIN SURGERY      SOCIAL HISTORY: Social History   Tobacco Use  . Smoking status: Never Smoker  .  Smokeless tobacco: Never Used  Substance Use Topics  . Alcohol use: No    Alcohol/week: 0.0 standard drinks  . Drug use: No    FAMILY HISTORY: Family History  Problem Relation Age of Onset  . Colon polyps Mother   . Diabetes Mother   . High blood pressure Mother   . High Cholesterol Mother   . Thyroid disease Mother   . Anxiety disorder Mother   . Colon cancer Maternal Aunt   . Liver cancer Maternal Aunt   . Breast cancer Unknown   . Coronary artery disease Unknown   . Colon cancer Unknown   . Diabetes Unknown   . Hypertension Unknown   . Kidney disease Unknown   . Lung cancer Unknown   . Lung cancer Brother        spleen, bone  . Diabetes Father   . High blood pressure Father   . High Cholesterol Father   . Heart disease Father   . Depression Father   . Obesity Father   . Esophageal cancer Neg Hx   . Stomach cancer Neg Hx   . Rectal cancer Neg Hx     ROS: Review of Systems  Constitutional: Positive for weight loss.  Respiratory: Negative for shortness of breath.   Cardiovascular: Negative for chest pain.  Gastrointestinal: Negative for nausea and vomiting.  Musculoskeletal:       Negative for muscle weakness  Neurological:       Negative for lightheadedness    Endo/Heme/Allergies: Negative for polydipsia.       Negative for hypoglycemia Negative for polyphagia Negative for polyuria     PHYSICAL EXAM: Blood pressure 131/71, pulse 68, temperature 97.9 F (36.6 C), temperature source Oral, height 5\' 4"  (1.626 m), weight 206 lb (93.4 kg), SpO2 98 %. Body mass index is 35.36 kg/m. Physical Exam  Constitutional: She is oriented to person, place, and time. She appears well-developed and well-nourished.  Cardiovascular: Normal rate.  Pulmonary/Chest: Effort normal.  Musculoskeletal: Normal range of motion.  Neurological: She is alert and oriented to person, place, and time.  Skin: Skin is warm and dry.  Psychiatric: She has Natalie Hall normal mood and affect. Her behavior is normal.  Vitals reviewed.   RECENT LABS AND TESTS: BMET    Component Value Date/Time   NA 138 08/04/2018 0921   K 4.8 08/04/2018 0921   CL 100 08/04/2018 0921   CO2 23 08/04/2018 0921   GLUCOSE 90 08/04/2018 0921   GLUCOSE 91 03/11/2018 0904   BUN 17 08/04/2018 0921   CREATININE 0.68 08/04/2018 0921   CREATININE 0.59 10/01/2016 0939   CALCIUM 9.1 08/04/2018 0921   GFRNONAA 90 08/04/2018 0921   GFRAA 104 08/04/2018 0921   Lab Results  Component Value Date   HGBA1C 5.9 (H) 08/04/2018   Lab Results  Component Value Date   INSULIN 33.5 (H) 08/04/2018   CBC    Component Value Date/Time   WBC 6.1 03/11/2018 0904   RBC 4.66 03/11/2018 0904   HGB 12.9 03/11/2018 0904   HCT 38.4 03/11/2018 0904   PLT 250.0 03/11/2018 0904   MCV 82.3 03/11/2018 0904   MCH 28.7 06/25/2017 1140   MCHC 33.6 03/11/2018 0904   RDW 15.7 (H) 03/11/2018 0904   LYMPHSABS 2.1 03/11/2018 0904   MONOABS 0.5 03/11/2018 0904   EOSABS 0.3 03/11/2018 0904   BASOSABS 0.0 03/11/2018 0904   Iron/TIBC/Ferritin/ %Sat No results found for: IRON, TIBC, FERRITIN, IRONPCTSAT Lipid Panel  Component Value Date/Time   CHOL 157 07/10/2018 1055   TRIG 241 (H) 07/10/2018 1055   HDL 28 (L) 07/10/2018  1055   CHOLHDL 5.6 (H) 07/10/2018 1055   CHOLHDL 5 03/11/2018 0904   VLDL 46.8 (H) 03/11/2018 0904   LDLCALC 81 07/10/2018 1055   LDLDIRECT 96.0 03/11/2018 0904   Hepatic Function Panel     Component Value Date/Time   PROT 7.1 08/04/2018 0921   ALBUMIN 4.3 08/04/2018 0921   AST 22 08/04/2018 0921   ALT 25 08/04/2018 0921   ALKPHOS 56 08/04/2018 0921   BILITOT 0.3 08/04/2018 0921   BILIDIR 0.1 03/11/2018 0904   IBILI 0.3 07/09/2016 0905      Component Value Date/Time   TSH 1.120 08/04/2018 0921   TSH 1.49 03/11/2018 0904   TSH 1.64 02/11/2017 0851   Results for Natalie Hall, Natalie Hall (MRN [id number]) as of 08/19/2018 16:16  Ref. Range 08/04/2018 09:21  Vitamin D, 25-Hydroxy Latest Ref Range: 30.0 - 100.0 ng/mL 43.5   ASSESSMENT AND PLAN: Essential hypertension  Vitamin D deficiency  Prediabetes  At risk for osteoporosis  Class 2 severe obesity with serious comorbidity and body mass index (BMI) of 35.0 to 35.9 in adult, unspecified obesity type ([place])  PLAN: Hypertension We discussed sodium restriction, working on healthy weight loss, and Natalie Hall regular exercise program as the means to achieve improved blood pressure control. Natalie Hall agreed with this plan and agreed to follow up as directed. We will continue to monitor her blood pressure as well as her progress with the above lifestyle modifications. She will continue her antihypertensive medications as prescribed and will watch for signs of hypotension as she continues her lifestyle modifications. Natalie Hall agrees to follow up with our office in 2 weeks.   Vitamin D Deficiency Natalie Hall was informed that low vitamin D levels contributes to fatigue and are associated with obesity, breast, and colon cancer. She agrees to continue taking the OTC Vit D and Calcium supplement.  Natalie Hall will follow up for routine testing of vitamin D, at least 2-3 times per year. She was informed of the risk of over-replacement of vitamin D and agrees to not increase  her dose unless she discusses this with [name] first. Natalie Hall agrees to follow up with our office in 2 weeks.   Pre-Diabetes Natalie Hall will continue to work on weight loss, exercise, and decreasing simple carbohydrates in her diet to help decrease the risk of diabetes. We dicussed. Pre-diabetes was discussed with Natalie Hall by myself and the Registered Dietician.  She was informed that eating too many simple carbohydrates or too many calories at one sitting increases the likelihood of GI side effects.  Alee agreed to follow up with our office in 2 weeks.   Obesity Natalie Hall is currently in the action stage of change. As such, her goal is to continue with weight loss efforts She has agreed to follow the Category 2 plan Natalie Hall has been instructed to work up to Natalie Hall goal of 150 minutes of combined cardio and strengthening exercise per week for weight loss and overall health benefits. We discussed the following Behavioral Modification Strategies today: increasing lean protein intake, decreasing simple carbohydrates , increasing vegetables, decreasing sodium intake, decrease eating out, no skipping meals and work on meal planning and easy cooking plans  Cadance has agreed to follow up with our clinic in 2 weeks. She was informed of the importance of frequent follow up visits to maximize her success with intensive lifestyle modifications for her multiple health conditions.  OBESITY BEHAVIORAL INTERVENTION VISIT  Today's visit was # 2   Starting weight: 213 lbs Starting date: 08/04/2018 Today's weight : Weight: 206 lb (93.4 kg)  Today's date: 08/18/2018 Total lbs lost to date: 7 lbs At least 15 minutes were spent on discussing the following behavioral intervention visit.   ASK: We discussed the diagnosis of obesity with [name] today and Ziva agreed to give [name] permission to discuss obesity behavioral modification therapy today.  ASSESS: Glenna has the diagnosis of obesity and her BMI today is  37.1 Taresa is in the action stage of change   ADVISE: Sharelle was educated on the multiple health risks of obesity as well as the benefit of weight loss to improve her health. She was advised of the need for long term treatment and the importance of lifestyle modifications to improve her current health and to decrease her risk of future health problems.  AGREE: Multiple dietary modification options and treatment options were discussed and  Bailyn agreed to follow the recommendations documented in the above note.  ARRANGE: Suha was educated on the importance of frequent visits to treat obesity as outlined per CMS and USPSTF guidelines and agreed to schedule her next follow up appointment today.  IRemi Deter, CMA, am acting as transcriptionist for Jearld Lesch, MD  I have reviewed the above documentation for accuracy and completeness, and I agree with the above. -Jearld Lesch, DO

## 2018-08-20 ENCOUNTER — Encounter (INDEPENDENT_AMBULATORY_CARE_PROVIDER_SITE_OTHER): Payer: Self-pay | Admitting: Bariatrics

## 2018-08-20 DIAGNOSIS — R7303 Prediabetes: Secondary | ICD-10-CM | POA: Insufficient documentation

## 2018-09-01 ENCOUNTER — Encounter (INDEPENDENT_AMBULATORY_CARE_PROVIDER_SITE_OTHER): Payer: Self-pay | Admitting: Bariatrics

## 2018-09-01 ENCOUNTER — Encounter: Payer: Self-pay | Admitting: Family Medicine

## 2018-09-01 ENCOUNTER — Ambulatory Visit (INDEPENDENT_AMBULATORY_CARE_PROVIDER_SITE_OTHER): Payer: Medicare Other | Admitting: Bariatrics

## 2018-09-01 VITALS — BP 125/73 | HR 67 | Temp 98.0°F | Ht 64.0 in | Wt 200.0 lb

## 2018-09-01 DIAGNOSIS — I10 Essential (primary) hypertension: Secondary | ICD-10-CM | POA: Diagnosis not present

## 2018-09-01 DIAGNOSIS — E7849 Other hyperlipidemia: Secondary | ICD-10-CM | POA: Diagnosis not present

## 2018-09-01 DIAGNOSIS — E669 Obesity, unspecified: Secondary | ICD-10-CM

## 2018-09-01 DIAGNOSIS — R7303 Prediabetes: Secondary | ICD-10-CM

## 2018-09-01 DIAGNOSIS — Z6834 Body mass index (BMI) 34.0-34.9, adult: Secondary | ICD-10-CM | POA: Diagnosis not present

## 2018-09-02 NOTE — Progress Notes (Signed)
Office: [phone]  /  Fax: [phone]   HPI:   Chief Complaint: OBESITY Natalie Hall is here to discuss her progress with her obesity treatment plan. She is on the Category 2 plan and is following her eating plan approximately 95 % of the time. She states she is exercising 0 minutes 0 times per week. Natalie Hall is following the Category 2 plan. She has not struggling and she is not having any increase in hunger. Her weight is 200 lb (90.7 kg) today and has had a weight loss of 6 pounds over a period of 2 weeks since her last visit. She has lost 13 lbs since starting treatment with [name].  Pre-Diabetes Natalie Hall has a diagnosis of prediabetes based on her elevated Hgb A1c of 5.9 and fasting insulin level of 33.5. Natalie Hall was informed this puts her at greater risk of developing diabetes. Natalie Hall is not on medications and she continues to work on diet and exercise to decrease risk of diabetes. She denies polydipsia or polyuria.  Hypertension Natalie Hall is a 67 y.o. female with hypertension. She is taking Metoprolol and Ramipril currently. Natalie Hall denies any hypotension. She is working weight loss to help control her blood Hall with the goal of decreasing her risk of heart attack and stroke. Natalie Hall is well controlled.  Hall Natalie Hall and she is currently taking Crestor with no side effects. Her last labs were done on 07/10/18. She has been trying to improve her cholesterol levels with intensive lifestyle modification including a low saturated fat diet, exercise and weight loss. She denies myalgias.  ALLERGIES: Allergies  Allergen Reactions  . Codeine Other (See Comments), Shortness Of Breath and Rash    flushing flushing   . Levofloxacin In D5w Other (See Comments)    Leg pain  Leg pain   . Amoxicillin   . Amoxicillin-Pot Clavulanate Rash    Has patient had a PCN reaction causing immediate rash, facial/tongue/throat swelling, SOB or  lightheadedness with hypotension: No Has patient had a PCN reaction causing severe rash involving mucus membranes or skin necrosis: No Has patient had a PCN reaction that required hospitalization: No Has patient had a PCN reaction occurring within the last 10 years: Yes If all of the above answers are "NO", then may proceed with Cephalosporin use.  Has patient had a PCN reaction causing immediate rash, facial/tongue/throat swelling, SOB or lightheadedness with hypotension: No Has patient had a PCN reaction causing severe rash involving mucus membranes or skin necrosis: No Has patient had a PCN reaction that required hospitalization: No Has patient had a PCN reaction occurring within the last 10 years: Yes If all of the above answers are "NO", then may proceed with Cephalosporin use.  . Escitalopram Oxalate Rash    MEDICATIONS: Current Outpatient Medications on File Prior to Visit  Medication Sig Dispense Refill  . acetaminophen (TYLENOL) 325 MG tablet Take 500 mg by mouth every 6 (six) hours as needed.    Marland Kitchen aspirin EC 81 MG tablet Take 81 mg by mouth daily.    . Calcium Carb-Cholecalciferol (CALCIUM PLUS VITAMIN D3) 600-800 MG-UNIT TABS Take by mouth.    . calcium carbonate (CALCIUM 600) 600 MG TABS tablet Take 600 mg by mouth 2 (two) times daily with a meal.    . Cholecalciferol (VITAMIN D3) 20 MCG (800 UNIT) TABS Take 1 tablet by mouth daily.    . diphenhydramine-acetaminophen (TYLENOL PM) 25-500 MG TABS tablet Take 2 tablets by mouth at bedtime  as needed (for pain.).    Marland Kitchen ezetimibe (ZETIA) 10 MG tablet TAKE 1 TABLET BY MOUTH EVERY DAY 30 tablet 0  . metoprolol succinate (TOPROL-XL) 25 MG 24 hr tablet TAKE 1 TABLET BY MOUTH  TWICE A DAY 180 tablet 2  . Multiple Vitamins-Minerals (MULTIVITAMIN PO) Take 1 tablet by mouth daily.     . nitroGLYCERIN (NITROSTAT) 0.4 MG SL tablet DISSOLVE 1 TABLET UNDER THE TONGUE EVERY 5 MINUTES FOR  3 DOSES AS NEEDED FOR CHEST PAIN 100 tablet 1  . omeprazole  (PRILOSEC) 40 MG capsule TAKE 1 CAPSULE BY MOUTH  DAILY 90 capsule 1  . ramipril (ALTACE) 10 MG capsule TAKE 1 CAPSULE BY MOUTH  DAILY 90 capsule 2  . rosuvastatin (CRESTOR) 40 MG tablet TAKE 1 TABLET BY MOUTH  DAILY 90 tablet 2  . triamcinolone (NASACORT) 55 MCG/ACT nasal inhaler Place 2 sprays into the nose daily. 3 Inhaler 3   No current facility-administered medications on file prior to visit.     PAST MEDICAL HISTORY: Past Medical History:  Diagnosis Date  . Asthma    infrequent problem - no treatment since 2006  . Back pain   . Choroidal nevus of left eye    sees Dr. Gerarda Fraction at [facility]   . Constipation   . Coronary artery disease   . Dysrhythmia    SVT - ablation by Dr. Curt Bears on 05/21/2016 and episodes since  . Fatty liver   . GERD (gastroesophageal reflux disease)   . History of hiatal hernia    "very small"  . Hx of colonic polyp   . Hall   . Hypertension   . Joint pain   . Melanoma of eye, left ([place])    sees Dr. Cordelia Pen  . Myocardial infarction ([place])   . Osteoarthritis   . Shortness of breath   . Supraventricular tachycardia ([place])     PAST SURGICAL HISTORY: Past Surgical History:  Procedure Laterality Date  . ABDOMINAL HYSTERECTOMY    . ACHILLES TENDON REPAIR  10-07-11   torn on right, per Dr. Noemi Chapel   . ANKLE SURGERY     torn tendon left ankle  . BREAST BIOPSY    . CARDIAC CATHETERIZATION N/A 03/14/2016   Procedure: Left Heart Cath and Coronary Angiography;  Surgeon: Peter M Martinique, MD;  Location: [place] [facility];  Service: Cardiovascular;  Laterality: N/A;  . CARDIAC CATHETERIZATION N/A 03/14/2016   Procedure: Coronary Stent Intervention;  Surgeon: Peter M Martinique, MD;  Location: [facility];  Service: Cardiovascular;  Laterality: N/A;  . CARPAL TUNNEL RELEASE    . CATARACT EXTRACTION W/ INTRAOCULAR LENS IMPLANT Bilateral   . CHOLECYSTECTOMY    . COLONOSCOPY  06-23-14   per Dr. Ardis Hughs, adenomatous polyps, repeat in 5 yrs     . ELECTROPHYSIOLOGIC STUDY N/A 05/21/2016   Procedure: SVT Ablation;  Surgeon: [name], MD;  Location: [facility];  Service: Cardiovascular;  Laterality: N/A;  . EYE SURGERY Left 08/2016  . KNEE CLOSED REDUCTION Left 02/03/2014   Procedure: CLOSED MANIPULATION LEFT KNEE;  Surgeon: Gearlean Alf, MD;  Location: [facility];  Service: Orthopedics;  Laterality: Left;  . POLYPECTOMY    . REVERSE SHOULDER ARTHROPLASTY     Left  . REVERSE SHOULDER ARTHROPLASTY Left 06/28/2017   Procedure: LEFT SHOULDER REVERSE SHOULDER ARTHROPLASTY;  Surgeon: Netta Cedars, MD;  Location: [facility];  Service: Orthopedics;  Laterality: Left;  . TOTAL KNEE ARTHROPLASTY Left 12/21/2013  Procedure: LEFT TOTAL KNEE ARTHROPLASTY;  Surgeon: Gearlean Alf, MD;  Location: [facility];  Service: Orthopedics;  Laterality: Left;  Marland Kitchen VARICOSE VEIN SURGERY      SOCIAL HISTORY: Social History   Tobacco Use  . Smoking status: Never Smoker  . Smokeless tobacco: Never Used  Substance Use Topics  . Alcohol use: No    Alcohol/week: 0.0 standard drinks  . Drug use: No    FAMILY HISTORY: Family History  Problem Relation Age of Onset  . Colon polyps Mother   . Diabetes Mother   . High blood Hall Mother   . High Cholesterol Mother   . Thyroid disease Mother   . Anxiety disorder Mother   . Colon cancer Maternal Aunt   . Liver cancer Maternal Aunt   . Breast cancer Unknown   . Coronary artery disease Unknown   . Colon cancer Unknown   . Diabetes Unknown   . Hypertension Unknown   . Kidney disease Unknown   . Lung cancer Unknown   . Lung cancer Brother        spleen, bone  . Diabetes Father   . High blood Hall Father   . High Cholesterol Father   . Heart disease Father   . Depression Father   . Obesity Father   . Esophageal cancer Neg Hx   . Stomach cancer Neg Hx   . Rectal cancer Neg Hx     ROS: Review of Systems  Constitutional: Positive for weight loss.  Cardiovascular:       Negative  for hypotension  Genitourinary: Negative for frequency.  Musculoskeletal: Negative for myalgias.  Endo/Heme/Allergies: Negative for polydipsia.    PHYSICAL EXAM: Blood Hall 125/73, pulse 67, temperature 98 F (36.7 C), temperature source Oral, height 5\' 4"  (1.626 m), weight 200 lb (90.7 kg), SpO2 97 %. Body mass index is 34.33 kg/m. Physical Exam  Constitutional: She is oriented to person, place, and time. She appears well-developed and well-nourished.  Cardiovascular: Normal rate.  Pulmonary/Chest: Effort normal.  Musculoskeletal: Normal range of motion.  Neurological: She is oriented to person, place, and time.  Skin: Skin is warm and dry.  Psychiatric: She has a normal mood and affect. Her behavior is normal.  Vitals reviewed.   RECENT LABS AND TESTS: BMET    Component Value Date/Time   NA 138 08/04/2018 0921   K 4.8 08/04/2018 0921   CL 100 08/04/2018 0921   CO2 23 08/04/2018 0921   GLUCOSE 90 08/04/2018 0921   GLUCOSE 91 03/11/2018 0904   BUN 17 08/04/2018 0921   CREATININE 0.68 08/04/2018 0921   CREATININE 0.59 10/01/2016 0939   CALCIUM 9.1 08/04/2018 0921   GFRNONAA 90 08/04/2018 0921   GFRAA 104 08/04/2018 0921   Lab Results  Component Value Date   HGBA1C 5.9 (H) 08/04/2018   Lab Results  Component Value Date   INSULIN 33.5 (H) 08/04/2018   CBC    Component Value Date/Time   WBC 6.1 03/11/2018 0904   RBC 4.66 03/11/2018 0904   HGB 12.9 03/11/2018 0904   HCT 38.4 03/11/2018 0904   PLT 250.0 03/11/2018 0904   MCV 82.3 03/11/2018 0904   MCH 28.7 06/25/2017 1140   MCHC 33.6 03/11/2018 0904   RDW 15.7 (H) 03/11/2018 0904   LYMPHSABS 2.1 03/11/2018 0904   MONOABS 0.5 03/11/2018 0904   EOSABS 0.3 03/11/2018 0904   BASOSABS 0.0 03/11/2018 0904   Iron/TIBC/Ferritin/ %Sat No results found for: IRON, TIBC, FERRITIN, IRONPCTSAT Lipid  Panel     Component Value Date/Time   CHOL 157 07/10/2018 1055   TRIG 241 (H) 07/10/2018 1055   HDL 28 (L)  07/10/2018 1055   CHOLHDL 5.6 (H) 07/10/2018 1055   CHOLHDL 5 03/11/2018 0904   VLDL 46.8 (H) 03/11/2018 0904   LDLCALC 81 07/10/2018 1055   LDLDIRECT 96.0 03/11/2018 0904   Hepatic Function Panel     Component Value Date/Time   PROT 7.1 08/04/2018 0921   ALBUMIN 4.3 08/04/2018 0921   AST 22 08/04/2018 0921   ALT 25 08/04/2018 0921   ALKPHOS 56 08/04/2018 0921   BILITOT 0.3 08/04/2018 0921   BILIDIR 0.1 03/11/2018 0904   IBILI 0.3 07/09/2016 0905      Component Value Date/Time   TSH 1.120 08/04/2018 0921   TSH 1.49 03/11/2018 0904   TSH 1.64 02/11/2017 0851   Results for Kaedence, Connelly TAJAE MAIOLO (MRN [id number]) as of 09/02/2018 09:04  Ref. Range 08/04/2018 09:21  Vitamin D, 25-Hydroxy Latest Ref Range: 30.0 - 100.0 ng/mL 43.5   ASSESSMENT AND PLAN: Prediabetes  Essential hypertension  Other Hall  Class 1 obesity with serious comorbidity and body mass index (BMI) of 34.0 to 34.9 in adult, unspecified obesity type  PLAN:  Pre-Diabetes Kacee will continue to work on weight loss, exercise, and decreasing simple carbohydrates in her diet to help decrease the risk of diabetes. She was informed that eating too many simple carbohydrates or too many calories at one sitting increases the likelihood of GI side effects. Keitra will continue to decrease simple carbohydrates and increase her protein intake. Abriana will follow up with [name] as directed to monitor her progress.  Hypertension We discussed sodium restriction, working on healthy weight loss, and a regular exercise program as the means to achieve improved blood Hall control. Daneshia agreed with this plan and agreed to follow up as directed. We will continue to monitor her blood Hall as well as her progress with the above lifestyle modifications. She will continue her medications as prescribed and will watch for signs of hypotension as she continues her lifestyle modifications.  Hall Maekayla was informed of  the American Heart Association Guidelines emphasizing intensive lifestyle modifications as the first line treatment for Hall. We discussed many lifestyle modifications today in depth, and Daleen will continue to work on decreasing saturated fats such as fatty red meat, butter and many fried foods. She will also increase vegetables and lean protein in her diet and continue to work on exercise and weight loss efforts. Zylee will continue to take Crestor and will follow up at the agreed upon time.  Obesity Doryce is currently in the action stage of change. As such, her goal is to continue with weight loss efforts She has agreed to follow the Category 2 plan Marshia has been instructed to work up to a goal of 150 minutes of combined cardio and strengthening exercise per week for weight loss and overall health benefits. We discussed the following Behavioral Modification Strategies today: increase H2O intake, better snacking choices, increasing lean protein intake, decreasing simple carbohydrates, increasing vegetables, work on meal planning and easy cooking plans and ways to avoid night time snacking Debbera will find low calorie/ low carbohydrate foods to satisfy.  Sofiah has agreed to follow up with our clinic in 2 weeks. She was informed of the importance of frequent follow up visits to maximize her success with intensive lifestyle modifications for her multiple health conditions.   OBESITY BEHAVIORAL INTERVENTION VISIT  Today's visit was #  3   Starting weight: 213 lbs Starting date: 08/04/18 Today's weight : 200 lbs Today's date: 09/01/2018 Total lbs lost to date: 13 At least 15 minutes were spent on discussing the following behavioral intervention visit.   ASK: We discussed the diagnosis of obesity with [name] today and Chelbie agreed to give [name] permission to discuss obesity behavioral modification therapy today.  ASSESS: Quina has the diagnosis of obesity and her BMI  today is 34.31 Gerica is in the action stage of change   ADVISE: Hadyn was educated on the multiple health risks of obesity as well as the benefit of weight loss to improve her health. She was advised of the need for long term treatment and the importance of lifestyle modifications to improve her current health and to decrease her risk of future health problems.  AGREE: Multiple dietary modification options and treatment options were discussed and  Lilyanna agreed to follow the recommendations documented in the above note.  ARRANGE: Patrecia was educated on the importance of frequent visits to treat obesity as outlined per CMS and USPSTF guidelines and agreed to schedule her next follow up appointment today.  Corey Skains, am acting as Location manager for [employer]. Owens Shark, DO   I have reviewed the above documentation for accuracy and completeness, and I agree with the above. -Jearld Lesch, DO

## 2018-09-02 NOTE — Telephone Encounter (Signed)
Dr. Fry please advise. Thanks  

## 2018-09-05 NOTE — Telephone Encounter (Signed)
Call in Macrobid 100 mg bid for 7 days  

## 2018-09-09 MED ORDER — NITROFURANTOIN MONOHYD MACRO 100 MG PO CAPS: 100.0000 mg | ORAL_CAPSULE | Freq: Two times a day (BID) | ORAL | 0 refills | Status: DC

## 2018-09-15 ENCOUNTER — Ambulatory Visit (INDEPENDENT_AMBULATORY_CARE_PROVIDER_SITE_OTHER): Payer: Medicare Other | Admitting: Bariatrics

## 2018-09-15 ENCOUNTER — Encounter (INDEPENDENT_AMBULATORY_CARE_PROVIDER_SITE_OTHER): Payer: Self-pay | Admitting: Bariatrics

## 2018-09-15 VITALS — BP 126/75 | HR 69 | Temp 98.0°F | Ht 64.0 in | Wt 197.0 lb

## 2018-09-15 DIAGNOSIS — Z6833 Body mass index (BMI) 33.0-33.9, adult: Secondary | ICD-10-CM | POA: Diagnosis not present

## 2018-09-15 DIAGNOSIS — E669 Obesity, unspecified: Secondary | ICD-10-CM | POA: Diagnosis not present

## 2018-09-15 DIAGNOSIS — I10 Essential (primary) hypertension: Secondary | ICD-10-CM | POA: Diagnosis not present

## 2018-09-15 DIAGNOSIS — R7303 Prediabetes: Secondary | ICD-10-CM

## 2018-09-17 NOTE — Progress Notes (Signed)
Office: [phone]  /  Fax: [phone]   HPI:   Chief Complaint: OBESITY Natalie Hall is here to discuss her progress with her obesity treatment plan. She is on the Category 2 plan and is following her eating plan approximately 90 to 95 % of the time. She states she is exercising 0 minutes 0 times per week. Natalie Hall has not struggled with the plan. She has not been sleeping well. She struggled with getting in her protein.  Her weight is 197 lb (89.4 kg) today and has had a weight loss of 3 pounds over a period of 2 weeks since her last visit. She has lost 16 lbs since starting treatment with [name].  Pre-Diabetes Natalie Hall has a diagnosis of pre-diabetes based on her elevated Hgb A1c and was informed this puts her at greater risk of developing diabetes. Her Hgb A1c was 5.9 and Insulin was 33.5 on 08/04/18. She is not taking medications currently and continues to work on diet and exercise to decrease risk of diabetes. She admits mild hypoglycemia during the night.  Hypertension Natalie Hall is a 67 y.o. female with hypertension. She is working on weight loss to help control her blood pressure with the goal of decreasing her risk of heart attack and stroke. She is taking metoprolol 25mg  and ramipril 10mg . Natalie Hall's blood pressure is currently well controlled. Natalie Hall denies chest pain or headaches  ALLERGIES: Allergies  Allergen Reactions  . Codeine Other (See Comments), Shortness Of Breath and Rash    flushing flushing   . Levofloxacin In D5w Other (See Comments)    Leg pain  Leg pain   . Amoxicillin   . Amoxicillin-Pot Clavulanate Rash    Has patient had a PCN reaction causing immediate rash, facial/tongue/throat swelling, SOB or lightheadedness with hypotension: No Has patient had a PCN reaction causing severe rash involving mucus membranes or skin necrosis: No Has patient had a PCN reaction that required hospitalization: No Has patient had a PCN reaction occurring within the last 10 years:  Yes If all of the above answers are "NO", then may proceed with Cephalosporin use.  Has patient had a PCN reaction causing immediate rash, facial/tongue/throat swelling, SOB or lightheadedness with hypotension: No Has patient had a PCN reaction causing severe rash involving mucus membranes or skin necrosis: No Has patient had a PCN reaction that required hospitalization: No Has patient had a PCN reaction occurring within the last 10 years: Yes If all of the above answers are "NO", then may proceed with Cephalosporin use.  . Escitalopram Oxalate Rash    MEDICATIONS: Current Outpatient Medications on File Prior to Visit  Medication Sig Dispense Refill  . acetaminophen (TYLENOL) 325 MG tablet Take 500 mg by mouth every 6 (six) hours as needed.    Marland Kitchen aspirin EC 81 MG tablet Take 81 mg by mouth daily.    . Calcium Carb-Cholecalciferol (CALCIUM PLUS VITAMIN D3) 600-800 MG-UNIT TABS Take by mouth.    . calcium carbonate (CALCIUM 600) 600 MG TABS tablet Take 600 mg by mouth 2 (two) times daily with a meal.    . Cholecalciferol (VITAMIN D3) 20 MCG (800 UNIT) TABS Take 1 tablet by mouth daily.    . diphenhydramine-acetaminophen (TYLENOL PM) 25-500 MG TABS tablet Take 2 tablets by mouth at bedtime as needed (for pain.).    Marland Kitchen ezetimibe (ZETIA) 10 MG tablet TAKE 1 TABLET BY MOUTH EVERY DAY 30 tablet 0  . metoprolol succinate (TOPROL-XL) 25 MG 24 hr tablet TAKE 1 TABLET BY  MOUTH  TWICE A DAY 180 tablet 2  . Multiple Vitamins-Minerals (MULTIVITAMIN PO) Take 1 tablet by mouth daily.     . nitrofurantoin, macrocrystal-monohydrate, (MACROBID) 100 MG capsule Take 1 capsule (100 mg total) by mouth 2 (two) times daily. 14 capsule 0  . nitroGLYCERIN (NITROSTAT) 0.4 MG SL tablet DISSOLVE 1 TABLET UNDER THE TONGUE EVERY 5 MINUTES FOR  3 DOSES AS NEEDED FOR CHEST PAIN 100 tablet 1  . omeprazole (PRILOSEC) 40 MG capsule TAKE 1 CAPSULE BY MOUTH  DAILY 90 capsule 1  . ramipril (ALTACE) 10 MG capsule TAKE 1 CAPSULE BY  MOUTH  DAILY 90 capsule 2  . rosuvastatin (CRESTOR) 40 MG tablet TAKE 1 TABLET BY MOUTH  DAILY 90 tablet 2  . triamcinolone (NASACORT) 55 MCG/ACT nasal inhaler Place 2 sprays into the nose daily. 3 Inhaler 3   No current facility-administered medications on file prior to visit.     PAST MEDICAL HISTORY: Past Medical History:  Diagnosis Date  . Asthma    infrequent problem - no treatment since 2006  . Back pain   . Choroidal nevus of left eye    sees Dr. Gerarda Fraction at [facility].   . Constipation   . Coronary artery disease   . Dysrhythmia    SVT - ablation by Dr. Curt Bears on 05/21/2016 and episodes since  . Fatty liver   . GERD (gastroesophageal reflux disease)   . History of hiatal hernia    "very small"  . Hx of colonic polyp   . Hyperlipidemia   . Hypertension   . Joint pain   . Melanoma of eye, left ([place])    sees Dr. Cordelia Pen  . Myocardial infarction ([place])   . Osteoarthritis   . Shortness of breath   . Supraventricular tachycardia ([place])     PAST SURGICAL HISTORY: Past Surgical History:  Procedure Laterality Date  . ABDOMINAL HYSTERECTOMY    . ACHILLES TENDON REPAIR  10-07-11   torn on right, per Dr. Noemi Chapel   . ANKLE SURGERY     torn tendon left ankle  . BREAST BIOPSY    . CARDIAC CATHETERIZATION N/A 03/14/2016   Procedure: Left Heart Cath and Coronary Angiography;  Surgeon: Peter M Martinique, MD;  Location: [facility];  Service: Cardiovascular;  Laterality: N/A;  . CARDIAC CATHETERIZATION N/A 03/14/2016   Procedure: Coronary Stent Intervention;  Surgeon: Peter M Martinique, MD;  Location: [facility];  Service: Cardiovascular;  Laterality: N/A;  . CARPAL TUNNEL RELEASE    . CATARACT EXTRACTION W/ INTRAOCULAR LENS IMPLANT Bilateral   . CHOLECYSTECTOMY    . COLONOSCOPY  06-23-14   per Dr. Ardis Hughs, adenomatous polyps, repeat in 5 yrs    . ELECTROPHYSIOLOGIC STUDY N/A 05/21/2016   Procedure: SVT Ablation;  Surgeon: [name], MD;  Location: [facility];  Service: Cardiovascular;  Laterality: N/A;  . EYE SURGERY Left 08/2016  . KNEE CLOSED REDUCTION Left 02/03/2014   Procedure: CLOSED MANIPULATION LEFT KNEE;  Surgeon: Gearlean Alf, MD;  Location: [facility];  Service: Orthopedics;  Laterality: Left;  . POLYPECTOMY    . REVERSE SHOULDER ARTHROPLASTY     Left  . REVERSE SHOULDER ARTHROPLASTY Left 06/28/2017   Procedure: LEFT SHOULDER REVERSE SHOULDER ARTHROPLASTY;  Surgeon: Netta Cedars, MD;  Location: [facility];  Service: Orthopedics;  Laterality: Left;  . TOTAL KNEE ARTHROPLASTY Left 12/21/2013   Procedure: LEFT TOTAL KNEE ARTHROPLASTY;  Surgeon: Gearlean Alf, MD;  Location:  [facility];  Service: Orthopedics;  Laterality: Left;  Marland Kitchen VARICOSE VEIN SURGERY      SOCIAL HISTORY: Social History   Tobacco Use  . Smoking status: Never Smoker  . Smokeless tobacco: Never Used  Substance Use Topics  . Alcohol use: No    Alcohol/week: 0.0 standard drinks  . Drug use: No    FAMILY HISTORY: Family History  Problem Relation Age of Onset  . Colon polyps Mother   . Diabetes Mother   . High blood pressure Mother   . High Cholesterol Mother   . Thyroid disease Mother   . Anxiety disorder Mother   . Colon cancer Maternal Aunt   . Liver cancer Maternal Aunt   . Breast cancer Unknown   . Coronary artery disease Unknown   . Colon cancer Unknown   . Diabetes Unknown   . Hypertension Unknown   . Kidney disease Unknown   . Lung cancer Unknown   . Lung cancer Brother        spleen, bone  . Diabetes Father   . High blood pressure Father   . High Cholesterol Father   . Heart disease Father   . Depression Father   . Obesity Father   . Esophageal cancer Neg Hx   . Stomach cancer Neg Hx   . Rectal cancer Neg Hx     ROS: Review of Systems  Constitutional: Positive for weight loss.  Cardiovascular: Negative for chest pain.  Neurological: Negative for headaches.  Endo/Heme/Allergies:       Positive for hypoglycemia.    PHYSICAL  EXAM: Blood pressure 126/75, pulse 69, temperature 98 F (36.7 C), temperature source Oral, height 5\' 4"  (1.626 m), weight 197 lb (89.4 kg), SpO2 98 %. Body mass index is 33.81 kg/m. Physical Exam  Constitutional: She is oriented to person, place, and time. She appears well-developed and well-nourished.  Cardiovascular: Normal rate.  Pulmonary/Chest: Effort normal.  Musculoskeletal: Normal range of motion.  Neurological: She is oriented to person, place, and time.  Skin: Skin is warm and dry.  Psychiatric: She has a normal mood and affect. Her behavior is normal.  Vitals reviewed.   RECENT LABS AND TESTS: BMET    Component Value Date/Time   NA 138 08/04/2018 0921   K 4.8 08/04/2018 0921   CL 100 08/04/2018 0921   CO2 23 08/04/2018 0921   GLUCOSE 90 08/04/2018 0921   GLUCOSE 91 03/11/2018 0904   BUN 17 08/04/2018 0921   CREATININE 0.68 08/04/2018 0921   CREATININE 0.59 10/01/2016 0939   CALCIUM 9.1 08/04/2018 0921   GFRNONAA 90 08/04/2018 0921   GFRAA 104 08/04/2018 0921   Lab Results  Component Value Date   HGBA1C 5.9 (H) 08/04/2018   Lab Results  Component Value Date   INSULIN 33.5 (H) 08/04/2018   CBC    Component Value Date/Time   WBC 6.1 03/11/2018 0904   RBC 4.66 03/11/2018 0904   HGB 12.9 03/11/2018 0904   HCT 38.4 03/11/2018 0904   PLT 250.0 03/11/2018 0904   MCV 82.3 03/11/2018 0904   MCH 28.7 06/25/2017 1140   MCHC 33.6 03/11/2018 0904   RDW 15.7 (H) 03/11/2018 0904   LYMPHSABS 2.1 03/11/2018 0904   MONOABS 0.5 03/11/2018 0904   EOSABS 0.3 03/11/2018 0904   BASOSABS 0.0 03/11/2018 0904   Iron/TIBC/Ferritin/ %Sat No results found for: IRON, TIBC, FERRITIN, IRONPCTSAT Lipid Panel     Component Value Date/Time   CHOL 157 07/10/2018 1055   TRIG  241 (H) 07/10/2018 1055   HDL 28 (L) 07/10/2018 1055   CHOLHDL 5.6 (H) 07/10/2018 1055   CHOLHDL 5 03/11/2018 0904   VLDL 46.8 (H) 03/11/2018 0904   LDLCALC 81 07/10/2018 1055   LDLDIRECT 96.0  03/11/2018 0904   Hepatic Function Panel     Component Value Date/Time   PROT 7.1 08/04/2018 0921   ALBUMIN 4.3 08/04/2018 0921   AST 22 08/04/2018 0921   ALT 25 08/04/2018 0921   ALKPHOS 56 08/04/2018 0921   BILITOT 0.3 08/04/2018 0921   BILIDIR 0.1 03/11/2018 0904   IBILI 0.3 07/09/2016 0905      Component Value Date/Time   TSH 1.120 08/04/2018 0921   TSH 1.49 03/11/2018 0904   TSH 1.64 02/11/2017 0851   Results for Monifah, Freehling JENTRI AYE (MRN [id number]) as of 09/17/2018 15:31  Ref. Range 08/04/2018 09:21  Vitamin D, 25-Hydroxy Latest Ref Range: 30.0 - 100.0 ng/mL 43.5   ASSESSMENT AND PLAN: Prediabetes  Essential hypertension  Class 1 obesity with serious comorbidity and body mass index (BMI) of 33.0 to 33.9 in adult, unspecified obesity type  PLAN:  Pre-Diabetes Natalie Hall will continue to work on weight loss, exercise, and decreasing simple carbohydrates in her diet to help decrease the risk of diabetes. She was informed that eating too many simple carbohydrates or too many calories at one sitting increases the likelihood of GI side effects. Natalie Hall agreed to increase protein with her evening meal and to follow up with [name] as directed to monitor her progress in 2 weeks.  Hypertension We discussed sodium restriction, working on healthy weight loss, and a regular exercise program as the means to achieve improved blood pressure control. We will continue to monitor her blood pressure as well as her progress with the above lifestyle modifications. She will continue her medications as prescribed and will watch for signs of hypotension as she continues her lifestyle modifications. Natalie Hall agreed with this plan and agreed to follow up as directed.  Obesity Natalie Hall is currently in the action stage of change. As such, her goal is to continue with weight loss efforts. She has agreed to follow the Category 2 plan. She was given "Thanksgiving" and "Eating Out"  Information handouts. Natalie Hall has  been instructed to work up to a goal of 150 minutes of combined cardio and strengthening exercise per week for weight loss and overall health benefits. We discussed the following Behavioral Modification Strategies today: increasing lean protein intake, decreasing simple carbohydrates, increasing vegetables, increase H2O intake, celebration eating strategies, and holiday eating strategies.   Natalie Hall has agreed to follow up with our clinic in 2 weeks. She was informed of the importance of frequent follow up visits to maximize her success with intensive lifestyle modifications for her multiple health conditions.   OBESITY BEHAVIORAL INTERVENTION VISIT  Today's visit was # 4   Starting weight: 213 lbs Starting date: 08/04/18 Today's weight : Weight: 197 lb (89.4 kg)  Today's date: 09/15/2018 Total lbs lost to date: 16 At least 15 minutes were spent on discussing the following behavioral intervention visit.  ASK: We discussed the diagnosis of obesity with Natalie Hall today and Natalie Hall agreed to give [name] permission to discuss obesity behavioral modification therapy today.  ASSESS: Natalie Hall has the diagnosis of obesity and her BMI today is 33.8. Natalie Hall is in the action stage of change.   ADVISE: Natalie Hall was educated on the multiple health risks of obesity as well as the benefit of weight loss to improve her health.  She was advised of the need for long term treatment and the importance of lifestyle modifications to improve her current health and to decrease her risk of future health problems.  AGREE: Multiple dietary modification options and treatment options were discussed and Natalie Hall agreed to follow the recommendations documented in the above note.  ARRANGE: Natalie Hall was educated on the importance of frequent visits to treat obesity as outlined per CMS and USPSTF guidelines and agreed to schedule her next follow up appointment today.  I, Marcille Blanco, am acting as Location manager for [employer].  Owens Shark, DO  I have reviewed the above documentation for accuracy and completeness, and I agree with the above. -Jearld Lesch, DO

## 2018-09-30 ENCOUNTER — Ambulatory Visit (INDEPENDENT_AMBULATORY_CARE_PROVIDER_SITE_OTHER): Payer: Medicare Other | Admitting: Bariatrics

## 2018-09-30 ENCOUNTER — Encounter (INDEPENDENT_AMBULATORY_CARE_PROVIDER_SITE_OTHER): Payer: Self-pay | Admitting: Bariatrics

## 2018-09-30 VITALS — BP 119/73 | HR 64 | Temp 97.9°F | Ht 64.0 in | Wt 194.0 lb

## 2018-09-30 DIAGNOSIS — R7303 Prediabetes: Secondary | ICD-10-CM

## 2018-09-30 DIAGNOSIS — E669 Obesity, unspecified: Secondary | ICD-10-CM | POA: Diagnosis not present

## 2018-09-30 DIAGNOSIS — I10 Essential (primary) hypertension: Secondary | ICD-10-CM

## 2018-09-30 DIAGNOSIS — Z6833 Body mass index (BMI) 33.0-33.9, adult: Secondary | ICD-10-CM

## 2018-09-30 LAB — LIPID PANEL
Chol/HDL Ratio: 3.5 ratio (ref 0.0–4.4)
Cholesterol, Total: 99 mg/dL — ABNORMAL LOW (ref 100–199)
HDL: 28 mg/dL — AB (ref >39)
LDL Calculated: 45 mg/dL (ref 0–99)
TRIGLYCERIDES: 129 mg/dL (ref 0–149)
VLDL Cholesterol Cal: 26 mg/dL (ref 5–40)

## 2018-10-01 ENCOUNTER — Other Ambulatory Visit: Payer: Self-pay | Admitting: Internal Medicine

## 2018-10-01 NOTE — Progress Notes (Signed)
Office: [phone]  /  Fax: [phone]   HPI:   Chief Complaint: OBESITY Natalie Hall is here to discuss her progress with her obesity treatment plan. She is on the Category 2 plan and is following her eating plan approximately 85 to 90 % of the time. She states she is walking 30 minutes 7 times per week. Natalie Hall is doing well, despite traveling and holidays. Her appetite and cravings are controlled. Her weight is 194 lb (88 kg) today and has had a weight loss of 3 pounds over a period of 2 weeks since her last visit. She has lost 19 lbs since starting treatment with [name].  Pre-Diabetes Natalie Hall has a diagnosis of prediabetes based on her elevated Hgb A1c of 5.9 and insulin level of 33.5 and was informed this puts her at greater risk of developing diabetes. Natalie Hall is not on medications and she continues to work on diet and exercise to decrease risk of diabetes. She denies hypoglycemia.  Hypertension Natalie Hall is a 67 y.o. female with hypertension. She is taking metoprolol and ramipril Natalie Hall denies chest pain or shortness of breath on exertion. She is working weight loss to help control her blood pressure with the goal of decreasing her risk of heart attack and stroke. Natalie Hall blood pressure is well controlled.  ALLERGIES: Allergies  Allergen Reactions  . Codeine Other (See Comments), Shortness Of Breath and Rash    flushing flushing   . Levofloxacin In D5w Other (See Comments)    Leg pain  Leg pain   . Amoxicillin   . Amoxicillin-Pot Clavulanate Rash    Has patient had a PCN reaction causing immediate rash, facial/tongue/throat swelling, SOB or lightheadedness with hypotension: No Has patient had a PCN reaction causing severe rash involving mucus membranes or skin necrosis: No Has patient had a PCN reaction that required hospitalization: No Has patient had a PCN reaction occurring within the last 10 years: Yes If all of the above answers are "NO", then may proceed with  Cephalosporin use.  Has patient had a PCN reaction causing immediate rash, facial/tongue/throat swelling, SOB or lightheadedness with hypotension: No Has patient had a PCN reaction causing severe rash involving mucus membranes or skin necrosis: No Has patient had a PCN reaction that required hospitalization: No Has patient had a PCN reaction occurring within the last 10 years: Yes If all of the above answers are "NO", then may proceed with Cephalosporin use.  . Escitalopram Oxalate Rash    MEDICATIONS: Current Outpatient Medications on File Prior to Visit  Medication Sig Dispense Refill  . acetaminophen (TYLENOL) 325 MG tablet Take 500 mg by mouth every 6 (six) hours as needed.    Natalie Hall Kitchen aspirin EC 81 MG tablet Take 81 mg by mouth daily.    . Calcium Carb-Cholecalciferol (CALCIUM PLUS VITAMIN D3) 600-800 MG-UNIT TABS Take by mouth.    . calcium carbonate (CALCIUM 600) 600 MG TABS tablet Take 600 mg by mouth 2 (two) times daily with a meal.    . Cholecalciferol (VITAMIN D3) 20 MCG (800 UNIT) TABS Take 1 tablet by mouth daily.    . diphenhydramine-acetaminophen (TYLENOL PM) 25-500 MG TABS tablet Take 2 tablets by mouth at bedtime as needed (for pain.).    Natalie Hall Kitchen ezetimibe (ZETIA) 10 MG tablet TAKE 1 TABLET BY MOUTH EVERY DAY 30 tablet 0  . metoprolol succinate (TOPROL-XL) 25 MG 24 hr tablet TAKE 1 TABLET BY MOUTH  TWICE A DAY 180 tablet 2  . Multiple Vitamins-Minerals (MULTIVITAMIN  PO) Take 1 tablet by mouth daily.     . nitrofurantoin, macrocrystal-monohydrate, (MACROBID) 100 MG capsule Take 1 capsule (100 mg total) by mouth 2 (two) times daily. 14 capsule 0  . nitroGLYCERIN (NITROSTAT) 0.4 MG SL tablet DISSOLVE 1 TABLET UNDER THE TONGUE EVERY 5 MINUTES FOR  3 DOSES AS NEEDED FOR CHEST PAIN 100 tablet 1  . omeprazole (PRILOSEC) 40 MG capsule TAKE 1 CAPSULE BY MOUTH  DAILY 90 capsule 1  . ramipril (ALTACE) 10 MG capsule TAKE 1 CAPSULE BY MOUTH  DAILY 90 capsule 2  . rosuvastatin (CRESTOR) 40 MG tablet  TAKE 1 TABLET BY MOUTH  DAILY 90 tablet 2  . triamcinolone (NASACORT) 55 MCG/ACT nasal inhaler Place 2 sprays into the nose daily. 3 Inhaler 3   No current facility-administered medications on file prior to visit.     PAST MEDICAL HISTORY: Past Medical History:  Diagnosis Date  . Asthma    infrequent problem - no treatment since 2006  . Back pain   . Choroidal nevus of left eye    sees Dr. Gerarda Fraction at [facility].   . Constipation   . Coronary artery disease   . Dysrhythmia    SVT - ablation by Dr. Curt Bears on 05/21/2016 and episodes since  . Fatty liver   . GERD (gastroesophageal reflux disease)   . History of hiatal hernia    "very small"  . Hx of colonic polyp   . Hyperlipidemia   . Hypertension   . Joint pain   . Melanoma of eye, left ([place])    sees Dr. Cordelia Pen  . Myocardial infarction ([place])   . Osteoarthritis   . Shortness of breath   . Supraventricular tachycardia ([place])     PAST SURGICAL HISTORY: Past Surgical History:  Procedure Laterality Date  . ABDOMINAL HYSTERECTOMY    . ACHILLES TENDON REPAIR  10-07-11   torn on right, per Dr. Noemi Chapel   . ANKLE SURGERY     torn tendon left ankle  . BREAST BIOPSY    . CARDIAC CATHETERIZATION N/A 03/14/2016   Procedure: Left Heart Cath and Coronary Angiography;  Surgeon: Peter M Martinique, MD;  Location: [facility];  Service: Cardiovascular;  Laterality: N/A;  . CARDIAC CATHETERIZATION N/A 03/14/2016   Procedure: Coronary Stent Intervention;  Surgeon: Peter M Martinique, MD;  Location: [facility];  Service: Cardiovascular;  Laterality: N/A;  . CARPAL TUNNEL RELEASE    . CATARACT EXTRACTION W/ INTRAOCULAR LENS IMPLANT Bilateral   . CHOLECYSTECTOMY    . COLONOSCOPY  06-23-14   per Dr. Ardis Hughs, adenomatous polyps, repeat in 5 yrs    . ELECTROPHYSIOLOGIC STUDY N/A 05/21/2016   Procedure: SVT Ablation;  Surgeon: [name], MD;  Location: [facility];  Service: Cardiovascular;  Laterality: N/A;  . EYE  SURGERY Left 08/2016  . KNEE CLOSED REDUCTION Left 02/03/2014   Procedure: CLOSED MANIPULATION LEFT KNEE;  Surgeon: Gearlean Alf, MD;  Location: [facility];  Service: Orthopedics;  Laterality: Left;  . POLYPECTOMY    . REVERSE SHOULDER ARTHROPLASTY     Left  . REVERSE SHOULDER ARTHROPLASTY Left 06/28/2017   Procedure: LEFT SHOULDER REVERSE SHOULDER ARTHROPLASTY;  Surgeon: Netta Cedars, MD;  Location: [facility];  Service: Orthopedics;  Laterality: Left;  . TOTAL KNEE ARTHROPLASTY Left 12/21/2013   Procedure: LEFT TOTAL KNEE ARTHROPLASTY;  Surgeon: Gearlean Alf, MD;  Location: [facility];  Service: Orthopedics;  Laterality: Left;  Natalie Hall Kitchen VARICOSE VEIN SURGERY  SOCIAL HISTORY: Social History   Tobacco Use  . Smoking status: Never Smoker  . Smokeless tobacco: Never Used  Substance Use Topics  . Alcohol use: No    Alcohol/week: 0.0 standard drinks  . Drug use: No    FAMILY HISTORY: Family History  Problem Relation Age of Onset  . Colon polyps Mother   . Diabetes Mother   . High blood pressure Mother   . High Cholesterol Mother   . Thyroid disease Mother   . Anxiety disorder Mother   . Colon cancer Maternal Aunt   . Liver cancer Maternal Aunt   . Breast cancer Unknown   . Coronary artery disease Unknown   . Colon cancer Unknown   . Diabetes Unknown   . Hypertension Unknown   . Kidney disease Unknown   . Lung cancer Unknown   . Lung cancer Brother        spleen, bone  . Diabetes Father   . High blood pressure Father   . High Cholesterol Father   . Heart disease Father   . Depression Father   . Obesity Father   . Esophageal cancer Neg Hx   . Stomach cancer Neg Hx   . Rectal cancer Neg Hx     ROS: Review of Systems  Constitutional: Positive for weight loss.  Respiratory: Negative for shortness of breath (on exertion).   Cardiovascular: Negative for chest pain.  Endo/Heme/Allergies:       Negative for hypoglycemia    PHYSICAL EXAM: Blood pressure 119/73, pulse 64,  temperature 97.9 F (36.6 C), temperature source Oral, height 5\' 4"  (1.626 m), weight 194 lb (88 kg), SpO2 98 %. Body mass index is 33.3 kg/m. Physical Exam  Constitutional: She is oriented to person, place, and time. She appears well-developed and well-nourished.  Cardiovascular: Normal rate.  Pulmonary/Chest: Effort normal.  Musculoskeletal: Normal range of motion.  Neurological: She is oriented to person, place, and time.  Skin: Skin is warm and dry.  Psychiatric: She has a normal mood and affect. Her behavior is normal.  Vitals reviewed.   RECENT LABS AND TESTS: BMET    Component Value Date/Time   NA 138 08/04/2018 0921   K 4.8 08/04/2018 0921   CL 100 08/04/2018 0921   CO2 23 08/04/2018 0921   GLUCOSE 90 08/04/2018 0921   GLUCOSE 91 03/11/2018 0904   BUN 17 08/04/2018 0921   CREATININE 0.68 08/04/2018 0921   CREATININE 0.59 10/01/2016 0939   CALCIUM 9.1 08/04/2018 0921   GFRNONAA 90 08/04/2018 0921   GFRAA 104 08/04/2018 0921   Lab Results  Component Value Date   HGBA1C 5.9 (H) 08/04/2018   Lab Results  Component Value Date   INSULIN 33.5 (H) 08/04/2018   CBC    Component Value Date/Time   WBC 6.1 03/11/2018 0904   RBC 4.66 03/11/2018 0904   HGB 12.9 03/11/2018 0904   HCT 38.4 03/11/2018 0904   PLT 250.0 03/11/2018 0904   MCV 82.3 03/11/2018 0904   MCH 28.7 06/25/2017 1140   MCHC 33.6 03/11/2018 0904   RDW 15.7 (H) 03/11/2018 0904   LYMPHSABS 2.1 03/11/2018 0904   MONOABS 0.5 03/11/2018 0904   EOSABS 0.3 03/11/2018 0904   BASOSABS 0.0 03/11/2018 0904   Iron/TIBC/Ferritin/ %Sat No results found for: IRON, TIBC, FERRITIN, IRONPCTSAT Lipid Panel     Component Value Date/Time   CHOL 99 (L) 09/30/2018 1108   TRIG 129 09/30/2018 1108   HDL 28 (L) 09/30/2018 1108  CHOLHDL 3.5 09/30/2018 1108   CHOLHDL 5 03/11/2018 0904   VLDL 46.8 (H) 03/11/2018 0904   LDLCALC 45 09/30/2018 1108   LDLDIRECT 96.0 03/11/2018 0904   Hepatic Function Panel       Component Value Date/Time   PROT 7.1 08/04/2018 0921   ALBUMIN 4.3 08/04/2018 0921   AST 22 08/04/2018 0921   ALT 25 08/04/2018 0921   ALKPHOS 56 08/04/2018 0921   BILITOT 0.3 08/04/2018 0921   BILIDIR 0.1 03/11/2018 0904   IBILI 0.3 07/09/2016 0905      Component Value Date/Time   TSH 1.120 08/04/2018 0921   TSH 1.49 03/11/2018 0904   TSH 1.64 02/11/2017 0851   Results for Nick, Armel NASIM GAROFANO (MRN [id number]) as of 10/01/2018 07:23  Ref. Range 08/04/2018 09:21  Vitamin D, 25-Hydroxy Latest Ref Range: 30.0 - 100.0 ng/mL 43.5   ASSESSMENT AND PLAN: Prediabetes  Essential hypertension  Class 1 obesity with serious comorbidity and body mass index (BMI) of 33.0 to 33.9 in adult, unspecified obesity type  PLAN:  Pre-Diabetes Natalie Hall will continue to work on weight loss, exercise, increasing lean protein and decreasing simple carbohydrates in her diet to help decrease the risk of diabetes. She was informed that eating too many simple carbohydrates or too many calories at one sitting increases the likelihood of GI side effects. Natalie Hall agreed to follow up with [name] as directed to monitor her progress.  Hypertension We discussed sodium restriction, working on healthy weight loss, and a regular exercise program as the means to achieve improved blood pressure control. Natalie Hall agreed with this plan and agreed to follow up as directed. We will continue to monitor her blood pressure as well as her progress with the above lifestyle modifications. She will continue her medications as prescribed and will watch for signs of hypotension as she continues her lifestyle modifications.  Obesity Natalie Hall is currently in the action stage of change. As such, her goal is to continue with weight loss efforts She has agreed to follow the Category 2 plan Natalie Hall has been instructed to work up to a goal of 150 minutes of combined cardio and strengthening exercise per week for weight loss and overall health  benefits. We discussed the following Behavioral Modification Strategies today: increase H2O intake, no skipping meals, increasing lean protein intake, decreasing simple carbohydrates, increasing vegetables, decrease eating out, work on more meal planning and easy cooking plans and holiday eating strategies   Natalie Hall has agreed to follow up with our clinic in 2 weeks. She was informed of the importance of frequent follow up visits to maximize her success with intensive lifestyle modifications for her multiple health conditions.   OBESITY BEHAVIORAL INTERVENTION VISIT  Today's visit was # 5   Starting weight: 213 lbs Starting date: 08/04/2018 Today's weight : 194 lbs Today's date: 09/30/2018 Total lbs lost to date: 19 At least 15 minutes were spent on discussing the following behavioral intervention visit.   ASK: We discussed the diagnosis of obesity with Natalie Hall today and Natalie Hall agreed to give [name] permission to discuss obesity behavioral modification therapy today.  ASSESS: Natalie Hall has the diagnosis of obesity and her BMI today is 33.28 Natalie Hall is in the action stage of change   ADVISE: Natalie Hall was educated on the multiple health risks of obesity as well as the benefit of weight loss to improve her health. She was advised of the need for long term treatment and the importance of lifestyle modifications to improve her current health and  to decrease her risk of future health problems.  AGREE: Multiple dietary modification options and treatment options were discussed and  Natalie Hall agreed to follow the recommendations documented in the above note.  ARRANGE: Natalie Hall was educated on the importance of frequent visits to treat obesity as outlined per CMS and USPSTF guidelines and agreed to schedule her next follow up appointment today.  Corey Skains, am acting as Location manager for [employer]. Owens Shark, DO  I have reviewed the above documentation for accuracy and completeness, and I agree  with the above. -Jearld Lesch, DO

## 2018-10-03 ENCOUNTER — Other Ambulatory Visit: Payer: Self-pay

## 2018-10-03 MED ORDER — EZETIMIBE 10 MG PO TABS: 10.0000 mg | ORAL_TABLET | Freq: Every day | ORAL | 3 refills | Status: DC

## 2018-10-03 NOTE — Telephone Encounter (Signed)
Rx(s) sent to pharmacy electronically.  

## 2018-10-20 ENCOUNTER — Encounter (INDEPENDENT_AMBULATORY_CARE_PROVIDER_SITE_OTHER): Payer: Self-pay | Admitting: Bariatrics

## 2018-10-20 ENCOUNTER — Ambulatory Visit (INDEPENDENT_AMBULATORY_CARE_PROVIDER_SITE_OTHER): Payer: Medicare Other | Admitting: Bariatrics

## 2018-10-20 VITALS — BP 120/71 | HR 62 | Temp 97.8°F | Ht 64.0 in | Wt 192.0 lb

## 2018-10-20 DIAGNOSIS — R7303 Prediabetes: Secondary | ICD-10-CM | POA: Diagnosis not present

## 2018-10-20 DIAGNOSIS — Z6833 Body mass index (BMI) 33.0-33.9, adult: Secondary | ICD-10-CM

## 2018-10-20 DIAGNOSIS — I10 Essential (primary) hypertension: Secondary | ICD-10-CM | POA: Diagnosis not present

## 2018-10-20 DIAGNOSIS — E66811 Obesity, class 1: Secondary | ICD-10-CM

## 2018-10-20 DIAGNOSIS — E669 Obesity, unspecified: Secondary | ICD-10-CM

## 2018-10-20 NOTE — Progress Notes (Signed)
Office: [phone]  /  Fax: [phone]   HPI:   Chief Complaint: OBESITY Natalie Hall is here to discuss her progress with her obesity treatment plan. She is on the Category 2 plan and is following her eating plan approximately 80 % of the time. She states she is walking 30 minutes 3 to 4 times per week. Natalie Hall is doing well overall. She did not struggle with the holidays. Her weight is 192 lb (87.1 kg) today and has had a weight loss of 2 pounds over a period of 3 weeks since her last visit. She has lost 21 lbs since starting treatment with [name].  Pre-Diabetes Natalie Hall has a diagnosis of prediabetes based on her elevated Hgb A1c and was informed this puts her at greater risk of developing diabetes. Her last insulin level was at 33.5 and last A1c was at 5.9. She is not taking medications currently and continues to work on diet and exercise to decrease risk of diabetes. She denies nausea or hypoglycemia.  Hypertension Natalie Hall is a 67 y.o. female with hypertension. She is currently taking Ramipril and Metoprolol. Natalie Hall denies chest pain or shortness of breath on exertion. She is working weight loss to help control her blood pressure with the goal of decreasing her risk of heart attack and stroke. Natalie Hall blood pressure is currently controlled.  Hyperlipidemia Natalie Hall has hyperlipidemia (see lipid panel) and she is on Zetia. Her total cholesterol is low at 99, LDL is at 45 and Triglycerides are low at 129. She has been trying to improve her cholesterol levels with intensive lifestyle modification including a low saturated fat diet, exercise and weight loss. She denies any chest pain, claudication or myalgias.  ASSESSMENT AND PLAN:  Prediabetes  Essential hypertension  Class 1 obesity with serious comorbidity and body mass index (BMI) of 33.0 to 33.9 in adult, unspecified obesity type  PLAN:  Pre-Diabetes Natalie Hall will continue to work on weight loss, exercise, increasing lean  protein and decreasing simple carbohydrates in her diet to help decrease the risk of diabetes. She was informed that eating too many simple carbohydrates or too many calories at one sitting increases the likelihood of GI side effects. Natalie Hall agreed to follow up with [name] as directed to monitor her progress.  Hypertension We discussed sodium restriction, working on healthy weight loss, and a regular exercise program as the means to achieve improved blood pressure control. Natalie Hall agreed with this plan and agreed to follow up as directed. We will continue to monitor her blood pressure as well as her progress with the above lifestyle modifications. She will continue her medications as prescribed and will watch for signs of hypotension as she continues her lifestyle modifications.  Hyperlipidemia Natalie Hall was informed of the American Heart Association Guidelines emphasizing intensive lifestyle modifications as the first line treatment for hyperlipidemia. We discussed many lifestyle modifications today in depth, and Natalie Hall will continue to work on decreasing carbohydrates and saturated fats such as fatty red meat, butter and many fried foods. She will also increase vegetables and lean protein in her diet and continue to work on exercise and weight loss efforts. She will continue Zetia and follow up as directed.  Obesity Natalie Hall is currently in the action stage of change. As such, her goal is to continue with weight loss efforts She has agreed to follow the Category 2 plan Natalie Hall has been instructed to work up to a goal of 150 minutes of combined cardio and strengthening exercise per week for  weight loss and overall health benefits. We discussed the following Behavioral Modification Strategies today: increase H2O intake, better snacking choices, increasing lean protein intake, decreasing simple carbohydrates and increasing vegetables Handouts for "eating out", homemade seasonings and store bought seasonings were  provided to patient today.  Natalie Hall has agreed to follow up with our clinic in 2 weeks. She was informed of the importance of frequent follow up visits to maximize her success with intensive lifestyle modifications for her multiple health conditions.  ALLERGIES: Allergies  Allergen Reactions  . Codeine Other (See Comments), Shortness Of Breath and Rash    flushing flushing   . Levofloxacin In D5w Other (See Comments)    Leg pain  Leg pain   . Amoxicillin   . Amoxicillin-Pot Clavulanate Rash    Has patient had a PCN reaction causing immediate rash, facial/tongue/throat swelling, SOB or lightheadedness with hypotension: No Has patient had a PCN reaction causing severe rash involving mucus membranes or skin necrosis: No Has patient had a PCN reaction that required hospitalization: No Has patient had a PCN reaction occurring within the last 10 years: Yes If all of the above answers are "NO", then may proceed with Cephalosporin use.  Has patient had a PCN reaction causing immediate rash, facial/tongue/throat swelling, SOB or lightheadedness with hypotension: No Has patient had a PCN reaction causing severe rash involving mucus membranes or skin necrosis: No Has patient had a PCN reaction that required hospitalization: No Has patient had a PCN reaction occurring within the last 10 years: Yes If all of the above answers are "NO", then may proceed with Cephalosporin use.  . Escitalopram Oxalate Rash    MEDICATIONS: Current Outpatient Medications on File Prior to Visit  Medication Sig Dispense Refill  . acetaminophen (TYLENOL) 325 MG tablet Take 500 mg by mouth every 6 (six) hours as needed.    Marland Kitchen aspirin EC 81 MG tablet Take 81 mg by mouth daily.    . Calcium Carb-Cholecalciferol (CALCIUM PLUS VITAMIN D3) 600-800 MG-UNIT TABS Take by mouth.    . calcium carbonate (CALCIUM 600) 600 MG TABS tablet Take 600 mg by mouth 2 (two) times daily with a meal.    . Cholecalciferol (VITAMIN D3) 20 MCG  (800 UNIT) TABS Take 1 tablet by mouth daily.    . diphenhydramine-acetaminophen (TYLENOL PM) 25-500 MG TABS tablet Take 2 tablets by mouth at bedtime as needed (for pain.).    Marland Kitchen ezetimibe (ZETIA) 10 MG tablet Take 1 tablet (10 mg total) by mouth daily. 90 tablet 3  . metoprolol succinate (TOPROL-XL) 25 MG 24 hr tablet TAKE 1 TABLET BY MOUTH  TWICE A DAY 180 tablet 2  . Multiple Vitamins-Minerals (MULTIVITAMIN PO) Take 1 tablet by mouth daily.     . nitrofurantoin, macrocrystal-monohydrate, (MACROBID) 100 MG capsule Take 1 capsule (100 mg total) by mouth 2 (two) times daily. 14 capsule 0  . nitroGLYCERIN (NITROSTAT) 0.4 MG SL tablet DISSOLVE 1 TABLET UNDER THE TONGUE EVERY 5 MINUTES FOR  3 DOSES AS NEEDED FOR CHEST PAIN 100 tablet 1  . omeprazole (PRILOSEC) 40 MG capsule TAKE 1 CAPSULE BY MOUTH  DAILY 90 capsule 1  . ramipril (ALTACE) 10 MG capsule TAKE 1 CAPSULE BY MOUTH  DAILY 90 capsule 2  . rosuvastatin (CRESTOR) 40 MG tablet TAKE 1 TABLET BY MOUTH  DAILY 90 tablet 2  . triamcinolone (NASACORT) 55 MCG/ACT nasal inhaler Place 2 sprays into the nose daily. 3 Inhaler 3   No current facility-administered medications on file prior  to visit.     PAST MEDICAL HISTORY: Past Medical History:  Diagnosis Date  . Asthma    infrequent problem - no treatment since 2006  . Back pain   . Choroidal nevus of left eye    sees Dr. Gerarda Fraction at [facility].   . Constipation   . Coronary artery disease   . Dysrhythmia    SVT - ablation by Dr. Curt Bears on 05/21/2016 and episodes since  . Fatty liver   . GERD (gastroesophageal reflux disease)   . History of hiatal hernia    "very small"  . Hx of colonic polyp   . Hyperlipidemia   . Hypertension   . Joint pain   . Melanoma of eye, left ([place])    sees Dr. Cordelia Pen  . Myocardial infarction ([place])   . Osteoarthritis   . Shortness of breath   . Supraventricular tachycardia ([place])     PAST SURGICAL HISTORY: Past Surgical History:  Procedure  Laterality Date  . ABDOMINAL HYSTERECTOMY    . ACHILLES TENDON REPAIR  10-07-11   torn on right, per Dr. Noemi Chapel   . ANKLE SURGERY     torn tendon left ankle  . BREAST BIOPSY    . CARDIAC CATHETERIZATION N/A 03/14/2016   Procedure: Left Heart Cath and Coronary Angiography;  Surgeon: Peter M Martinique, MD;  Location: [place] [facility];  Service: Cardiovascular;  Laterality: N/A;  . CARDIAC CATHETERIZATION N/A 03/14/2016   Procedure: Coronary Stent Intervention;  Surgeon: Peter M Martinique, MD;  Location: [place] [facility];  Service: Cardiovascular;  Laterality: N/A;  . CARPAL TUNNEL RELEASE    . CATARACT EXTRACTION W/ INTRAOCULAR LENS IMPLANT Bilateral   . CHOLECYSTECTOMY    . COLONOSCOPY  06-23-14   per Dr. Ardis Hughs, adenomatous polyps, repeat in 5 yrs    . ELECTROPHYSIOLOGIC STUDY N/A 05/21/2016   Procedure: SVT Ablation;  Surgeon: [name], MD;  Location: [place] [facility];  Service: Cardiovascular;  Laterality: N/A;  . EYE SURGERY Left 08/2016  . KNEE CLOSED REDUCTION Left 02/03/2014   Procedure: CLOSED MANIPULATION LEFT KNEE;  Surgeon: Gearlean Alf, MD;  Location: [facility];  Service: Orthopedics;  Laterality: Left;  . POLYPECTOMY    . REVERSE SHOULDER ARTHROPLASTY     Left  . REVERSE SHOULDER ARTHROPLASTY Left 06/28/2017   Procedure: LEFT SHOULDER REVERSE SHOULDER ARTHROPLASTY;  Surgeon: Netta Cedars, MD;  Location: [facility];  Service: Orthopedics;  Laterality: Left;  . TOTAL KNEE ARTHROPLASTY Left 12/21/2013   Procedure: LEFT TOTAL KNEE ARTHROPLASTY;  Surgeon: Gearlean Alf, MD;  Location: [facility];  Service: Orthopedics;  Laterality: Left;  Marland Kitchen VARICOSE VEIN SURGERY      SOCIAL HISTORY: Social History   Tobacco Use  . Smoking status: Never Smoker  . Smokeless tobacco: Never Used  Substance Use Topics  . Alcohol use: No    Alcohol/week: 0.0 standard drinks  . Drug use: No    FAMILY HISTORY: Family History  Problem Relation Age of Onset  . Colon polyps Mother   . Diabetes  Mother   . High blood pressure Mother   . High Cholesterol Mother   . Thyroid disease Mother   . Anxiety disorder Mother   . Colon cancer Maternal Aunt   . Liver cancer Maternal Aunt   . Breast cancer Unknown   . Coronary artery disease Unknown   . Colon cancer Unknown   . Diabetes Unknown   . Hypertension Unknown   .  Kidney disease Unknown   . Lung cancer Unknown   . Lung cancer Brother        spleen, bone  . Diabetes Father   . High blood pressure Father   . High Cholesterol Father   . Heart disease Father   . Depression Father   . Obesity Father   . Esophageal cancer Neg Hx   . Stomach cancer Neg Hx   . Rectal cancer Neg Hx     ROS: Review of Systems  Constitutional: Positive for weight loss.  Respiratory: Negative for shortness of breath (on exertion).   Cardiovascular: Negative for chest pain and claudication.  Gastrointestinal: Negative for nausea.  Musculoskeletal: Negative for myalgias.  Endo/Heme/Allergies:       Negative for hypoglycemia    PHYSICAL EXAM: Blood pressure 120/71, pulse 62, temperature 97.8 F (36.6 C), temperature source Oral, height 5\' 4"  (1.626 m), weight 192 lb (87.1 kg), SpO2 99 %. Body mass index is 32.96 kg/m. Physical Exam Vitals signs reviewed.  Constitutional:      Appearance: Normal appearance. She is well-developed. She is obese.  Cardiovascular:     Rate and Rhythm: Normal rate.  Pulmonary:     Effort: Pulmonary effort is normal.  Musculoskeletal: Normal range of motion.  Skin:    General: Skin is warm and dry.  Neurological:     Mental Status: She is alert and oriented to person, place, and time.  Psychiatric:        Mood and Affect: Mood normal.        Behavior: Behavior normal.     RECENT LABS AND TESTS: BMET    Component Value Date/Time   NA 138 08/04/2018 0921   K 4.8 08/04/2018 0921   CL 100 08/04/2018 0921   CO2 23 08/04/2018 0921   GLUCOSE 90 08/04/2018 0921   GLUCOSE 91 03/11/2018 0904   BUN 17  08/04/2018 0921   CREATININE 0.68 08/04/2018 0921   CREATININE 0.59 10/01/2016 0939   CALCIUM 9.1 08/04/2018 0921   GFRNONAA 90 08/04/2018 0921   GFRAA 104 08/04/2018 0921   Lab Results  Component Value Date   HGBA1C 5.9 (H) 08/04/2018   Lab Results  Component Value Date   INSULIN 33.5 (H) 08/04/2018   CBC    Component Value Date/Time   WBC 6.1 03/11/2018 0904   RBC 4.66 03/11/2018 0904   HGB 12.9 03/11/2018 0904   HCT 38.4 03/11/2018 0904   PLT 250.0 03/11/2018 0904   MCV 82.3 03/11/2018 0904   MCH 28.7 06/25/2017 1140   MCHC 33.6 03/11/2018 0904   RDW 15.7 (H) 03/11/2018 0904   LYMPHSABS 2.1 03/11/2018 0904   MONOABS 0.5 03/11/2018 0904   EOSABS 0.3 03/11/2018 0904   BASOSABS 0.0 03/11/2018 0904   Iron/TIBC/Ferritin/ %Sat No results found for: IRON, TIBC, FERRITIN, IRONPCTSAT Lipid Panel     Component Value Date/Time   CHOL 99 (L) 09/30/2018 1108   TRIG 129 09/30/2018 1108   HDL 28 (L) 09/30/2018 1108   CHOLHDL 3.5 09/30/2018 1108   CHOLHDL 5 03/11/2018 0904   VLDL 46.8 (H) 03/11/2018 0904   LDLCALC 45 09/30/2018 1108   LDLDIRECT 96.0 03/11/2018 0904   Hepatic Function Panel     Component Value Date/Time   PROT 7.1 08/04/2018 0921   ALBUMIN 4.3 08/04/2018 0921   AST 22 08/04/2018 0921   ALT 25 08/04/2018 0921   ALKPHOS 56 08/04/2018 0921   BILITOT 0.3 08/04/2018 0921   BILIDIR 0.1 03/11/2018 6440  IBILI 0.3 07/09/2016 0905      Component Value Date/Time   TSH 1.120 08/04/2018 0921   TSH 1.49 03/11/2018 0904   TSH 1.64 02/11/2017 0851   Results for CECEILIA, CEPHUS (MRN [id number]) as of 10/20/2018 12:17  Ref. Range 08/04/2018 09:21  Vitamin D, 25-Hydroxy Latest Ref Range: 30.0 - 100.0 ng/mL 43.5     OBESITY BEHAVIORAL INTERVENTION VISIT  Today's visit was # 6   Starting weight: 213 lbs Starting date: 08/04/2018 Today's weight : 192 lbs Today's date: 10/20/2018 Total lbs lost to date: 21 At least 15 minutes were spent on discussing the  following behavioral intervention visit.   ASK: We discussed the diagnosis of obesity with Natalie Hall today and Natalie Hall agreed to give [name] permission to discuss obesity behavioral modification therapy today.  ASSESS: Natalie Hall has the diagnosis of obesity and her BMI today is 32.94 Natalie Hall is in the action stage of change   ADVISE: Natalie Hall was educated on the multiple health risks of obesity as well as the benefit of weight loss to improve her health. She was advised of the need for long term treatment and the importance of lifestyle modifications to improve her current health and to decrease her risk of future health problems.  AGREE: Multiple dietary modification options and treatment options were discussed and  Natalie Hall agreed to follow the recommendations documented in the above note.  ARRANGE: Natalie Hall was educated on the importance of frequent visits to treat obesity as outlined per CMS and USPSTF guidelines and agreed to schedule her next follow up appointment today.  Corey Skains, am acting as Location manager for [employer]. Owens Shark, DO  I have reviewed the above documentation for accuracy and completeness, and I agree with the above. -Jearld Lesch, DO

## 2018-11-05 ENCOUNTER — Ambulatory Visit (INDEPENDENT_AMBULATORY_CARE_PROVIDER_SITE_OTHER): Payer: Medicare Other | Admitting: Bariatrics

## 2018-11-05 ENCOUNTER — Encounter (INDEPENDENT_AMBULATORY_CARE_PROVIDER_SITE_OTHER): Payer: Self-pay | Admitting: Bariatrics

## 2018-11-05 VITALS — BP 117/72 | HR 71 | Temp 98.0°F | Ht 64.0 in | Wt 190.0 lb

## 2018-11-05 DIAGNOSIS — E669 Obesity, unspecified: Secondary | ICD-10-CM | POA: Diagnosis not present

## 2018-11-05 DIAGNOSIS — E7849 Other hyperlipidemia: Secondary | ICD-10-CM

## 2018-11-05 DIAGNOSIS — Z6832 Body mass index (BMI) 32.0-32.9, adult: Secondary | ICD-10-CM | POA: Diagnosis not present

## 2018-11-05 DIAGNOSIS — R7303 Prediabetes: Secondary | ICD-10-CM | POA: Diagnosis not present

## 2018-11-05 DIAGNOSIS — I10 Essential (primary) hypertension: Secondary | ICD-10-CM | POA: Diagnosis not present

## 2018-11-06 NOTE — Progress Notes (Signed)
Office: [phone]  /  Fax: [phone]   HPI:   Chief Complaint: OBESITY Natalie Hall is here to discuss her progress with her obesity treatment plan. She is on the Category 2 plan and is following her eating plan approximately 80 % of the time. She states she is exercising 0 minutes 0 times per week. Natalie Hall struggled some over the last few weeks. She craves sweets, but she uses her 200 calories. Her weight is 190 lb (86.2 kg) today and has had a weight loss of 1 pound over a period of 2 weeks since her last visit. She has lost 22 lbs since starting treatment with [name].  Hyperlipidemia Natalie Hall has hyperlipidemia and she is taking Zetia and Crestor. She has been trying to improve her cholesterol levels with intensive lifestyle modification including a low saturated fat diet, exercise and weight loss. She denies myalgias.  Hypertension Natalie Hall is a 68 y.o. female with hypertension. She is taking Metoprolol and Altace. Natalie Hall denies chest pain or shortness of breath on exertion. She is working weight loss to help control her blood pressure with the goal of decreasing her risk of heart attack and stroke. Marcias blood pressure is well controlled.  Pre-Diabetes Natalie Hall has a diagnosis of prediabetes based on her elevated Hgb A1c and was informed this puts her at greater risk of developing diabetes. Her last A1c was at 5.9 and last insulin level was at 33.5 She is not taking medications currently and continues to work on diet and exercise to decrease risk of diabetes. She denies nausea or hypoglycemia.  ASSESSMENT AND PLAN:  Other hyperlipidemia  Essential hypertension  Prediabetes  Class 1 obesity with serious comorbidity and body mass index (BMI) of 32.0 to 32.9 in adult, unspecified obesity type  PLAN:  Hyperlipidemia Natalie Hall was informed of the American Heart Association Guidelines emphasizing intensive lifestyle modifications as the first line treatment for hyperlipidemia. We  discussed many lifestyle modifications today in depth, and Natalie Hall will continue to work on decreasing saturated fats such as fatty red meat, butter and many fried foods. She will also increase vegetables and lean protein in her diet and continue to work on exercise and weight loss efforts. She will continue her medications as prescribed and follow up as directed.  Hypertension We discussed sodium restriction, working on healthy weight loss, and a regular exercise program as the means to achieve improved blood pressure control. Natalie Hall agreed with this plan and agreed to follow up as directed. We will continue to monitor her blood pressure as well as her progress with the above lifestyle modifications. She will continue her medications as prescribed and will watch for signs of hypotension as she continues her lifestyle modifications.  Pre-Diabetes Natalie Hall will continue to work on weight loss, exercise, increasing lean protein and decreasing simple carbohydrates in her diet to help decrease the risk of diabetes. She was informed that eating too many simple carbohydrates or too many calories at one sitting increases the likelihood of GI side effects. Natalie Hall agreed to follow up with [name] as directed to monitor her progress.  Obesity Natalie Hall is currently in the action stage of change. As such, her goal is to continue with weight loss efforts She has agreed to follow the Category 2 plan Natalie Hall has been instructed to work up to a goal of 150 minutes of combined cardio and strengthening exercise per week for weight loss and overall health benefits. We discussed the following Behavioral Modification Strategies today: increase H2O intake (  will take water bottle when she travels), no skipping meals, increasing lean protein intake, decreasing simple carbohydrates, increasing vegetables and work on meal planning and easy cooking plans Handouts for additional Category 1 and Category 2 options were given to patient  today.  Natalie Hall has agreed to follow up with our clinic in 2 weeks fasting. She was informed of the importance of frequent follow up visits to maximize her success with intensive lifestyle modifications for her multiple health conditions.  ALLERGIES: Allergies  Allergen Reactions  . Codeine Other (See Comments), Shortness Of Breath and Rash    flushing flushing   . Levofloxacin In D5w Other (See Comments)    Leg pain  Leg pain   . Amoxicillin   . Amoxicillin-Pot Clavulanate Rash    Has patient had a PCN reaction causing immediate rash, facial/tongue/throat swelling, SOB or lightheadedness with hypotension: No Has patient had a PCN reaction causing severe rash involving mucus membranes or skin necrosis: No Has patient had a PCN reaction that required hospitalization: No Has patient had a PCN reaction occurring within the last 10 years: Yes If all of the above answers are "NO", then may proceed with Cephalosporin use.  Has patient had a PCN reaction causing immediate rash, facial/tongue/throat swelling, SOB or lightheadedness with hypotension: No Has patient had a PCN reaction causing severe rash involving mucus membranes or skin necrosis: No Has patient had a PCN reaction that required hospitalization: No Has patient had a PCN reaction occurring within the last 10 years: Yes If all of the above answers are "NO", then may proceed with Cephalosporin use.  . Escitalopram Oxalate Rash    MEDICATIONS: Current Outpatient Medications on File Prior to Visit  Medication Sig Dispense Refill  . acetaminophen (TYLENOL) 325 MG tablet Take 500 mg by mouth every 6 (six) hours as needed.    Marland Kitchen aspirin EC 81 MG tablet Take 81 mg by mouth daily.    . Calcium Carb-Cholecalciferol (CALCIUM PLUS VITAMIN D3) 600-800 MG-UNIT TABS Take by mouth.    . calcium carbonate (CALCIUM 600) 600 MG TABS tablet Take 600 mg by mouth 2 (two) times daily with a meal.    . Cholecalciferol (VITAMIN D3) 20 MCG (800 UNIT)  TABS Take 1 tablet by mouth daily.    . diphenhydramine-acetaminophen (TYLENOL PM) 25-500 MG TABS tablet Take 2 tablets by mouth at bedtime as needed (for pain.).    Marland Kitchen ezetimibe (ZETIA) 10 MG tablet Take 1 tablet (10 mg total) by mouth daily. 90 tablet 3  . metoprolol succinate (TOPROL-XL) 25 MG 24 hr tablet TAKE 1 TABLET BY MOUTH  TWICE A DAY 180 tablet 2  . Multiple Vitamins-Minerals (MULTIVITAMIN PO) Take 1 tablet by mouth daily.     . nitrofurantoin, macrocrystal-monohydrate, (MACROBID) 100 MG capsule Take 1 capsule (100 mg total) by mouth 2 (two) times daily. 14 capsule 0  . nitroGLYCERIN (NITROSTAT) 0.4 MG SL tablet DISSOLVE 1 TABLET UNDER THE TONGUE EVERY 5 MINUTES FOR  3 DOSES AS NEEDED FOR CHEST PAIN 100 tablet 1  . omeprazole (PRILOSEC) 40 MG capsule TAKE 1 CAPSULE BY MOUTH  DAILY 90 capsule 1  . ramipril (ALTACE) 10 MG capsule TAKE 1 CAPSULE BY MOUTH  DAILY 90 capsule 2  . rosuvastatin (CRESTOR) 40 MG tablet TAKE 1 TABLET BY MOUTH  DAILY 90 tablet 2  . triamcinolone (NASACORT) 55 MCG/ACT nasal inhaler Place 2 sprays into the nose daily. 3 Inhaler 3   No current facility-administered medications on file prior to visit.  PAST MEDICAL HISTORY: Past Medical History:  Diagnosis Date  . Asthma    infrequent problem - no treatment since 2006  . Back pain   . Choroidal nevus of left eye    sees Dr. Gerarda Fraction at [facility].   . Constipation   . Coronary artery disease   . Dysrhythmia    SVT - ablation by Dr. Curt Bears on 05/21/2016 and episodes since  . Fatty liver   . GERD (gastroesophageal reflux disease)   . History of hiatal hernia    "very small"  . Hx of colonic polyp   . Hyperlipidemia   . Hypertension   . Joint pain   . Melanoma of eye, left ([place])    sees Dr. Cordelia Pen  . Myocardial infarction ([place])   . Osteoarthritis   . Shortness of breath   . Supraventricular tachycardia ([place])     PAST SURGICAL HISTORY: Past Surgical History:  Procedure Laterality Date   . ABDOMINAL HYSTERECTOMY    . ACHILLES TENDON REPAIR  10-07-11   torn on right, per Dr. Noemi Chapel   . ANKLE SURGERY     torn tendon left ankle  . BREAST BIOPSY    . CARDIAC CATHETERIZATION N/A 03/14/2016   Procedure: Left Heart Cath and Coronary Angiography;  Surgeon: Peter M Martinique, MD;  Location: [place];  Service: Cardiovascular;  Laterality: N/A;  . CARDIAC CATHETERIZATION N/A 03/14/2016   Procedure: Coronary Stent Intervention;  Surgeon: Peter M Martinique, MD;  Location: [place] [facility];  Service: Cardiovascular;  Laterality: N/A;  . CARPAL TUNNEL RELEASE    . CATARACT EXTRACTION W/ INTRAOCULAR LENS IMPLANT Bilateral   . CHOLECYSTECTOMY    . COLONOSCOPY  06-23-14   per Dr. Ardis Hughs, adenomatous polyps, repeat in 5 yrs    . ELECTROPHYSIOLOGIC STUDY N/A 05/21/2016   Procedure: SVT Ablation;  Surgeon: [name], MD;  Location: [facility] [facility];  Service: Cardiovascular;  Laterality: N/A;  . EYE SURGERY Left 08/2016  . KNEE CLOSED REDUCTION Left 02/03/2014   Procedure: CLOSED MANIPULATION LEFT KNEE;  Surgeon: Gearlean Alf, MD;  Location: [facility];  Service: Orthopedics;  Laterality: Left;  . POLYPECTOMY    . REVERSE SHOULDER ARTHROPLASTY     Left  . REVERSE SHOULDER ARTHROPLASTY Left 06/28/2017   Procedure: LEFT SHOULDER REVERSE SHOULDER ARTHROPLASTY;  Surgeon: Netta Cedars, MD;  Location: [facility];  Service: Orthopedics;  Laterality: Left;  . TOTAL KNEE ARTHROPLASTY Left 12/21/2013   Procedure: LEFT TOTAL KNEE ARTHROPLASTY;  Surgeon: Gearlean Alf, MD;  Location: [facility];  Service: Orthopedics;  Laterality: Left;  Marland Kitchen VARICOSE VEIN SURGERY      SOCIAL HISTORY: Social History   Tobacco Use  . Smoking status: Never Smoker  . Smokeless tobacco: Never Used  Substance Use Topics  . Alcohol use: No    Alcohol/week: 0.0 standard drinks  . Drug use: No    FAMILY HISTORY: Family History  Problem Relation Age of Onset  . Colon polyps Mother   . Diabetes Mother   . High  blood pressure Mother   . High Cholesterol Mother   . Thyroid disease Mother   . Anxiety disorder Mother   . Colon cancer Maternal Aunt   . Liver cancer Maternal Aunt   . Breast cancer Unknown   . Coronary artery disease Unknown   . Colon cancer Unknown   . Diabetes Unknown   . Hypertension Unknown   . Kidney disease Unknown   .  Lung cancer Unknown   . Lung cancer Brother        spleen, bone  . Diabetes Father   . High blood pressure Father   . High Cholesterol Father   . Heart disease Father   . Depression Father   . Obesity Father   . Esophageal cancer Neg Hx   . Stomach cancer Neg Hx   . Rectal cancer Neg Hx     ROS: Review of Systems  Constitutional: Positive for weight loss.  Respiratory: Negative for shortness of breath (on exertion).   Cardiovascular: Negative for chest pain.  Gastrointestinal: Negative for nausea.  Musculoskeletal: Negative for myalgias.  Endo/Heme/Allergies:       Negative for hypoglycemia    PHYSICAL EXAM: Blood pressure 117/72, pulse 71, temperature 98 F (36.7 C), temperature source Oral, height 5\' 4"  (1.626 m), weight 190 lb (86.2 kg), SpO2 97 %. Body mass index is 32.61 kg/m. Physical Exam Vitals signs reviewed.  Constitutional:      Appearance: Normal appearance. She is well-developed. She is obese.  Cardiovascular:     Rate and Rhythm: Normal rate.  Pulmonary:     Effort: Pulmonary effort is normal.  Musculoskeletal: Normal range of motion.  Skin:    General: Skin is warm and dry.  Neurological:     Mental Status: She is alert and oriented to person, place, and time.  Psychiatric:        Mood and Affect: Mood normal.        Behavior: Behavior normal.     RECENT LABS AND TESTS: BMET    Component Value Date/Time   NA 138 08/04/2018 0921   K 4.8 08/04/2018 0921   CL 100 08/04/2018 0921   CO2 23 08/04/2018 0921   GLUCOSE 90 08/04/2018 0921   GLUCOSE 91 03/11/2018 0904   BUN 17 08/04/2018 0921   CREATININE 0.68  08/04/2018 0921   CREATININE 0.59 10/01/2016 0939   CALCIUM 9.1 08/04/2018 0921   GFRNONAA 90 08/04/2018 0921   GFRAA 104 08/04/2018 0921   Lab Results  Component Value Date   HGBA1C 5.9 (H) 08/04/2018   Lab Results  Component Value Date   INSULIN 33.5 (H) 08/04/2018   CBC    Component Value Date/Time   WBC 6.1 03/11/2018 0904   RBC 4.66 03/11/2018 0904   HGB 12.9 03/11/2018 0904   HCT 38.4 03/11/2018 0904   PLT 250.0 03/11/2018 0904   MCV 82.3 03/11/2018 0904   MCH 28.7 06/25/2017 1140   MCHC 33.6 03/11/2018 0904   RDW 15.7 (H) 03/11/2018 0904   LYMPHSABS 2.1 03/11/2018 0904   MONOABS 0.5 03/11/2018 0904   EOSABS 0.3 03/11/2018 0904   BASOSABS 0.0 03/11/2018 0904   Iron/TIBC/Ferritin/ %Sat No results found for: IRON, TIBC, FERRITIN, IRONPCTSAT Lipid Panel     Component Value Date/Time   CHOL 99 (L) 09/30/2018 1108   TRIG 129 09/30/2018 1108   HDL 28 (L) 09/30/2018 1108   CHOLHDL 3.5 09/30/2018 1108   CHOLHDL 5 03/11/2018 0904   VLDL 46.8 (H) 03/11/2018 0904   LDLCALC 45 09/30/2018 1108   LDLDIRECT 96.0 03/11/2018 0904   Hepatic Function Panel     Component Value Date/Time   PROT 7.1 08/04/2018 0921   ALBUMIN 4.3 08/04/2018 0921   AST 22 08/04/2018 0921   ALT 25 08/04/2018 0921   ALKPHOS 56 08/04/2018 0921   BILITOT 0.3 08/04/2018 0921   BILIDIR 0.1 03/11/2018 0904   IBILI 0.3 07/09/2016 0905  Component Value Date/Time   TSH 1.120 08/04/2018 0921   TSH 1.49 03/11/2018 0904   TSH 1.64 02/11/2017 0851     Ref. Range 08/04/2018 09:21  Vitamin D, 25-Hydroxy Latest Ref Range: 30.0 - 100.0 ng/mL 43.5     OBESITY BEHAVIORAL INTERVENTION VISIT  Today's visit was # 7   Starting weight: 213 Starting date: 08/04/2018.  Today's weight : 190 Today's date: 11/05/2018 Total lbs lost to date: 23 At least 15 minutes were spent on discussing the following behavioral intervention visit.   ASK: We discussed the diagnosis of obesity with [name]  today and Lewanna agreed to give [name] permission to discuss obesity behavioral modification therapy today.  ASSESS: Loni has the diagnosis of obesity and her BMI today is 32.6 Sincere is in the action stage of change   ADVISE: Shaquia was educated on the multiple health risks of obesity as well as the benefit of weight loss to improve her health. She was advised of the need for long term treatment and the importance of lifestyle modifications to improve her current health and to decrease her risk of future health problems.  AGREE: Multiple dietary modification options and treatment options were discussed and  Clarine agreed to follow the recommendations documented in the above note.  ARRANGE: Solangel was educated on the importance of frequent visits to treat obesity as outlined per CMS and USPSTF guidelines and agreed to schedule her next follow up appointment today.  Corey Skains, am acting as Location manager for [employer]. Owens Shark, DO  I have reviewed the above documentation for accuracy and completeness, and I agree with the above. -Jearld Lesch, DO  I have reviewed the above documentation for accuracy and completeness, and I agree with the above. -Jearld Lesch, DO

## 2018-11-10 ENCOUNTER — Encounter (INDEPENDENT_AMBULATORY_CARE_PROVIDER_SITE_OTHER): Payer: Self-pay | Admitting: Bariatrics

## 2018-11-12 ENCOUNTER — Encounter: Payer: Self-pay | Admitting: Family Medicine

## 2018-11-12 ENCOUNTER — Ambulatory Visit (INDEPENDENT_AMBULATORY_CARE_PROVIDER_SITE_OTHER): Payer: Medicare Other | Admitting: Family Medicine

## 2018-11-12 VITALS — BP 132/64 | HR 73 | Temp 98.3°F | Wt 193.2 lb

## 2018-11-12 DIAGNOSIS — G8911 Acute pain due to trauma: Secondary | ICD-10-CM | POA: Diagnosis not present

## 2018-11-12 DIAGNOSIS — M545 Low back pain, unspecified: Secondary | ICD-10-CM

## 2018-11-12 DIAGNOSIS — M546 Pain in thoracic spine: Secondary | ICD-10-CM | POA: Diagnosis not present

## 2018-11-12 DIAGNOSIS — S0083XA Contusion of other part of head, initial encounter: Secondary | ICD-10-CM

## 2018-11-12 MED ORDER — CYCLOBENZAPRINE HCL 10 MG PO TABS: 10.0000 mg | ORAL_TABLET | Freq: Three times a day (TID) | ORAL | 0 refills | Status: DC | PRN

## 2018-11-12 MED ORDER — HYDROCODONE-ACETAMINOPHEN 5-325 MG PO TABS: 1.0000 | ORAL_TABLET | ORAL | 0 refills | Status: DC | PRN

## 2018-11-12 NOTE — Progress Notes (Signed)
   Subjective:    Patient ID: Natalie Hall, female    DOB: [birth year], 68 y.o.   MRN: [id number]  HPI Here for injuries from a fall that occurred 6 days ago. While she was carrying some boxes down a few steps she missed a step and fell forward. She struck her face against the side of her husband's car and bruised it up. She then fell to the ground and had some mild bruises to the hands and elbows and knees. She still has some soreness around the face and she has a lot of pain in the lower back. She applied ice to the face for a few days. There was no LOC and she has full memory of the fall. No vision issues, no dizziness or headaches.    Review of Systems  Constitutional: Negative.   HENT: Positive for facial swelling. Negative for hearing loss, rhinorrhea and sore throat.   Eyes: Negative.   Respiratory: Negative.   Cardiovascular: Negative.   Musculoskeletal: Positive for back pain.  Neurological: Negative.        Objective:   Physical Exam Constitutional:      Appearance: Normal appearance.  HENT:     Head: Normocephalic.     Comments: There is some ecchymosis around the left eyebrow and the nose. These areas are tender. No crepitus. The mouth and teeth are intact.     Right Ear: Tympanic membrane and ear canal normal.     Left Ear: Tympanic membrane and ear canal normal.     Mouth/Throat:     Pharynx: Oropharynx is clear.  Eyes:     Extraocular Movements: Extraocular movements intact.     Conjunctiva/sclera: Conjunctivae normal.     Pupils: Pupils are equal, round, and reactive to light.  Neck:     Musculoskeletal: Normal range of motion. No neck rigidity or muscular tenderness.  Cardiovascular:     Rate and Rhythm: Normal rate and regular rhythm.     Pulses: Normal pulses.     Heart sounds: Normal heart sounds.  Pulmonary:     Effort: Pulmonary effort is normal.     Breath sounds: Normal breath sounds.  Musculoskeletal:     Comments: She is very tender in the lower  back over the spine, just at the junction of thoracic and lumbar spines. ROM is limited by pain   Neurological:     General: No focal deficit present.     Mental Status: She is alert.     Cranial Nerves: No cranial nerve deficit.     Motor: No weakness.     Coordination: Coordination normal.     Gait: Gait normal.           Assessment & Plan:  She has injuries from a fall including bruises to the face and nose. She also has pain in lower back, and I am concerned she may have a compression fracture to one or more vertebrae. She may use heat and Norco for pain. Use Flexeril for spasms. We will set up MRI scans of the thoracic and lumbar spines.  Alysia Penna, MD

## 2018-11-16 ENCOUNTER — Ambulatory Visit
Admission: RE | Admit: 2018-11-16 | Discharge: 2018-11-16 | Disposition: A | Discharge status: Home / Self Care | Payer: Medicare Other | Source: Ambulatory Visit | Attending: Family Medicine | Admitting: Family Medicine

## 2018-11-16 DIAGNOSIS — M546 Pain in thoracic spine: Secondary | ICD-10-CM

## 2018-11-16 DIAGNOSIS — M545 Low back pain, unspecified: Secondary | ICD-10-CM

## 2018-11-19 ENCOUNTER — Encounter: Payer: Self-pay | Admitting: Family Medicine

## 2018-11-19 DIAGNOSIS — S22080A Wedge compression fracture of T11-T12 vertebra, initial encounter for closed fracture: Secondary | ICD-10-CM

## 2018-11-19 NOTE — Telephone Encounter (Signed)
Dr. Sarajane Jews the pt would like to move forward with the kyphoplasty.  thanks

## 2018-11-19 NOTE — Telephone Encounter (Signed)
I did the referral to Interventional Radiology for the procedure

## 2018-11-20 ENCOUNTER — Ambulatory Visit (INDEPENDENT_AMBULATORY_CARE_PROVIDER_SITE_OTHER): Payer: Medicare Other | Admitting: Bariatrics

## 2018-11-20 ENCOUNTER — Encounter (INDEPENDENT_AMBULATORY_CARE_PROVIDER_SITE_OTHER): Payer: Self-pay | Admitting: Bariatrics

## 2018-11-20 VITALS — BP 130/77 | HR 76 | Temp 97.9°F | Ht 64.0 in | Wt 188.0 lb

## 2018-11-20 DIAGNOSIS — Z6832 Body mass index (BMI) 32.0-32.9, adult: Secondary | ICD-10-CM | POA: Diagnosis not present

## 2018-11-20 DIAGNOSIS — R7303 Prediabetes: Secondary | ICD-10-CM | POA: Diagnosis not present

## 2018-11-20 DIAGNOSIS — I10 Essential (primary) hypertension: Secondary | ICD-10-CM | POA: Diagnosis not present

## 2018-11-20 DIAGNOSIS — E559 Vitamin D deficiency, unspecified: Secondary | ICD-10-CM | POA: Diagnosis not present

## 2018-11-20 DIAGNOSIS — E669 Obesity, unspecified: Secondary | ICD-10-CM | POA: Diagnosis not present

## 2018-11-20 DIAGNOSIS — K5909 Other constipation: Secondary | ICD-10-CM

## 2018-11-21 ENCOUNTER — Ambulatory Visit (INDEPENDENT_AMBULATORY_CARE_PROVIDER_SITE_OTHER): Payer: Medicare Other | Admitting: Family Medicine

## 2018-11-21 ENCOUNTER — Encounter: Payer: Self-pay | Admitting: Family Medicine

## 2018-11-21 VITALS — BP 120/74 | HR 76 | Temp 98.9°F | Wt 192.4 lb

## 2018-11-21 DIAGNOSIS — K5900 Constipation, unspecified: Secondary | ICD-10-CM

## 2018-11-21 LAB — COMPREHENSIVE METABOLIC PANEL
ALT: 15 IU/L (ref 0–32)
AST: 21 IU/L (ref 0–40)
Albumin/Globulin Ratio: 1.2 (ref 1.2–2.2)
Albumin: 4 g/dL (ref 3.8–4.8)
Alkaline Phosphatase: 71 IU/L (ref 39–117)
BUN/Creatinine Ratio: 32 — ABNORMAL HIGH (ref 12–28)
BUN: 21 mg/dL (ref 8–27)
Bilirubin Total: 0.3 mg/dL (ref 0.0–1.2)
CO2: 18 mmol/L — ABNORMAL LOW (ref 20–29)
Calcium: 9.3 mg/dL (ref 8.7–10.3)
Chloride: 98 mmol/L (ref 96–106)
Creatinine, Ser: 0.65 mg/dL (ref 0.57–1.00)
GFR calc Af Amer: 105 mL/min/{1.73_m2} (ref >59)
GFR calc non Af Amer: 92 mL/min/{1.73_m2} (ref >59)
Globulin, Total: 3.3 g/dL (ref 1.5–4.5)
Glucose: 78 mg/dL (ref 65–99)
Potassium: 4.4 mmol/L (ref 3.5–5.2)
SODIUM: 135 mmol/L (ref 134–144)
Total Protein: 7.3 g/dL (ref 6.0–8.5)

## 2018-11-21 LAB — HEMOGLOBIN A1C
Est. average glucose Bld gHb Est-mCnc: 117 mg/dL
Hgb A1c MFr Bld: 5.7 % — ABNORMAL HIGH (ref 4.8–5.6)

## 2018-11-21 LAB — VITAMIN D 25 HYDROXY (VIT D DEFICIENCY, FRACTURES): Vit D, 25-Hydroxy: 75.9 ng/mL (ref 30.0–100.0)

## 2018-11-21 LAB — INSULIN, RANDOM: INSULIN: 15.4 u[IU]/mL (ref 2.6–24.9)

## 2018-11-21 NOTE — Progress Notes (Signed)
   Subjective:    Patient ID: Natalie Hall, female    DOB: 11-11-1950, 68 y.o.   MRN: [id number]  HPI Here for constipation for the narcotic medication she had been taking . She fell recently and sustained a vertebral compression fracture. She is waiting to see Interventional Radiology for a possible kyphoplasty. She has had one small BM in the last week. She has tried magnesium citrate once and Miralax twice with no results. She feels bloated and has mild generalized abdominal pain.    Review of Systems  Constitutional: Negative.   Respiratory: Negative.   Cardiovascular: Negative.   Gastrointestinal: Positive for abdominal distention, abdominal pain and constipation. Negative for anal bleeding, blood in stool, diarrhea, nausea and vomiting.  Musculoskeletal: Positive for back pain.       Objective:   Physical Exam Constitutional:      General: She is not in acute distress.    Appearance: Normal appearance.  Cardiovascular:     Rate and Rhythm: Normal rate and regular rhythm.     Pulses: Normal pulses.     Heart sounds: Normal heart sounds.  Pulmonary:     Effort: Pulmonary effort is normal.     Breath sounds: Normal breath sounds.  Abdominal:     General: Abdomen is flat. Bowel sounds are normal. There is no distension.     Palpations: Abdomen is soft. There is no mass.     Tenderness: There is no abdominal tenderness. There is no guarding or rebound.     Hernia: No hernia is present.  Neurological:     Mental Status: She is alert.           Assessment & Plan:  Constipation. She will drink plenty of water. She will take 3 bottles of magnesium citrate with an hour spacing between each of them, also Dulcolax TID and Miralax BID. Recheck prn.  Alysia Penna, MD

## 2018-11-24 ENCOUNTER — Encounter: Payer: Self-pay | Admitting: Family Medicine

## 2018-11-24 NOTE — Progress Notes (Signed)
Office: [phone]  /  Fax: [phone]   HPI:   Chief Complaint: OBESITY Natalie Hall is here to discuss her progress with her obesity treatment plan. She is on the Category 2 plan and is following her eating plan approximately 85 to 90 % of the time. She states she is exercising 0 minutes 0 times per week. Natalie Hall is doing well with her plan overall. Her weight is 188 lb (85.3 kg) today and has had a weight loss of 2 pounds over a period of 2 weeks since her last visit. She has lost 25 lbs since starting treatment with [name].  Hypertension Natalie Hall is a 68 y.o. female with hypertension. She is taking Ramipril. Natalie Hall denies chest pain or shortness of breath on exertion. She is working weight loss to help control her blood pressure with the goal of decreasing her risk of heart attack and stroke. Natalie Hall blood pressure is well controlled.  Pre-Diabetes Natalie Hall has a diagnosis of prediabetes based on her elevated Hgb A1c and was informed this puts her at greater risk of developing diabetes. She is not taking medications currently and continues to work on diet and exercise to decrease risk of diabetes. She denies nausea or hypoglycemia.  Constipation Natalie Hall fell and she has been taking Norco and Flexeril notes constipation. She denies hematochezia or melena. She has compression of T11 and she is seeing a specialist.  Vitamin D deficiency Natalie Hall has a diagnosis of vitamin D deficiency. She is currently taking vit D and denies nausea, vomiting or muscle weakness.  ASSESSMENT AND PLAN:  Essential hypertension  Prediabetes - Plan: Comprehensive metabolic panel, Hemoglobin A1c, Insulin, random  Vitamin D deficiency - Plan: VITAMIN D 25 Hydroxy (Vit-D Deficiency, Fractures)  Other constipation  Class 1 obesity with serious comorbidity and body mass index (BMI) of 32.0 to 32.9 in adult, unspecified obesity type  PLAN:  Hypertension We discussed sodium restriction, working on  healthy weight loss, and a regular exercise program as the means to achieve improved blood pressure control. Armani agreed with this plan and agreed to follow up as directed. We will continue to monitor her blood pressure as well as her progress with the above lifestyle modifications. She will continue her medications as prescribed and will watch for signs of hypotension as she continues her lifestyle modifications.  Pre-Diabetes Natalie Hall will continue to work on weight loss, exercise, and decreasing simple carbohydrates in her diet to help decrease the risk of diabetes. We dicussed metformin including benefits and risks. She was informed that eating too many simple carbohydrates or too many calories at one sitting increases the likelihood of GI side effects. We will check Hgb A1c and insulin level and Natalie Hall agreed to follow up with [name] as directed to monitor her progress.  Constipation Natalie Hall agrees to use magnesium citrate, then Miralax 1 hour later, then Miralax 8 hours later and OTC enema if needed. She will begin flax seed 1 tsp daily. Natalie Hall agreed to follow up with our clinic at the agreed upon time.  Vitamin D Deficiency Natalie Hall was informed that low vitamin D levels contributes to fatigue and are associated with obesity, breast, and colon cancer. She will continue Vitamin D and will follow up for routine testing of vitamin D, at least 2-3 times per year. She was informed of the risk of over-replacement of vitamin D and agrees to not increase her dose unless she discusses this with [name] first. We will check vitamin D level and she will  follow up as directed.  Obesity Natalie Hall is currently in the action stage of change. As such, her goal is to continue with weight loss efforts She has agreed to follow the Category 2 plan Natalie Hall has been instructed to work up to a goal of 150 minutes of combined cardio and strengthening exercise per week for weight loss and overall health benefits. We discussed the  following Behavioral Modification Strategies today: no skipping meals, keeping healthy foods in the home, increasing lean protein intake, decreasing simple carbohydrates, increasing vegetables, increase high fiber foods and work on meal planning and easy cooking plans  Natalie Hall has agreed to follow up with our clinic in 2 weeks. She was informed of the importance of frequent follow up visits to maximize her success with intensive lifestyle modifications for her multiple health conditions.  ALLERGIES: Allergies  Allergen Reactions  . Codeine Other (See Comments), Shortness Of Breath and Rash    flushing flushing   . Levofloxacin In D5w Other (See Comments)    Leg pain  Leg pain   . Amoxicillin   . Amoxicillin-Pot Clavulanate Rash    Has patient had a PCN reaction causing immediate rash, facial/tongue/throat swelling, SOB or lightheadedness with hypotension: No Has patient had a PCN reaction causing severe rash involving mucus membranes or skin necrosis: No Has patient had a PCN reaction that required hospitalization: No Has patient had a PCN reaction occurring within the last 10 years: Yes If all of the above answers are "NO", then may proceed with Cephalosporin use.  Has patient had a PCN reaction causing immediate rash, facial/tongue/throat swelling, SOB or lightheadedness with hypotension: No Has patient had a PCN reaction causing severe rash involving mucus membranes or skin necrosis: No Has patient had a PCN reaction that required hospitalization: No Has patient had a PCN reaction occurring within the last 10 years: Yes If all of the above answers are "NO", then may proceed with Cephalosporin use.  . Escitalopram Oxalate Rash    MEDICATIONS: Current Outpatient Medications on File Prior to Visit  Medication Sig Dispense Refill  . acetaminophen (TYLENOL) 325 MG tablet Take 500 mg by mouth every 6 (six) hours as needed.    Marland Kitchen aspirin EC 81 MG tablet Take 81 mg by mouth daily.    .  Calcium Carb-Cholecalciferol (CALCIUM PLUS VITAMIN D3) 600-800 MG-UNIT TABS Take by mouth.    . calcium carbonate (CALCIUM 600) 600 MG TABS tablet Take 600 mg by mouth 2 (two) times daily with a meal.    . Cholecalciferol (VITAMIN D3) 20 MCG (800 UNIT) TABS Take 1 tablet by mouth daily.    . cyclobenzaprine (FLEXERIL) 10 MG tablet Take 1 tablet (10 mg total) by mouth 3 (three) times daily as needed for muscle spasms. 60 tablet 0  . diphenhydramine-acetaminophen (TYLENOL PM) 25-500 MG TABS tablet Take 2 tablets by mouth at bedtime as needed (for pain.).    Marland Kitchen ezetimibe (ZETIA) 10 MG tablet Take 1 tablet (10 mg total) by mouth daily. 90 tablet 3  . HYDROcodone-acetaminophen (NORCO) 5-325 MG tablet Take 1 tablet by mouth every 4 (four) hours as needed for moderate pain. (Patient not taking: Reported on 11/21/2018) 30 tablet 0  . metoprolol succinate (TOPROL-XL) 25 MG 24 hr tablet TAKE 1 TABLET BY MOUTH  TWICE A DAY 180 tablet 2  . Multiple Vitamins-Minerals (MULTIVITAMIN PO) Take 1 tablet by mouth daily.     . nitrofurantoin, macrocrystal-monohydrate, (MACROBID) 100 MG capsule Take 1 capsule (100 mg total) by mouth  2 (two) times daily. 14 capsule 0  . nitroGLYCERIN (NITROSTAT) 0.4 MG SL tablet DISSOLVE 1 TABLET UNDER THE TONGUE EVERY 5 MINUTES FOR  3 DOSES AS NEEDED FOR CHEST PAIN 100 tablet 1  . omeprazole (PRILOSEC) 40 MG capsule TAKE 1 CAPSULE BY MOUTH  DAILY 90 capsule 1  . ramipril (ALTACE) 10 MG capsule TAKE 1 CAPSULE BY MOUTH  DAILY 90 capsule 2  . rosuvastatin (CRESTOR) 40 MG tablet TAKE 1 TABLET BY MOUTH  DAILY 90 tablet 2  . triamcinolone (NASACORT) 55 MCG/ACT nasal inhaler Place 2 sprays into the nose daily. 3 Inhaler 3   No current facility-administered medications on file prior to visit.     PAST MEDICAL HISTORY: Past Medical History:  Diagnosis Date  . Asthma    infrequent problem - no treatment since 2006  . Back pain   . Choroidal nevus of left eye    sees Dr. Gerarda Fraction at  [facility].   . Constipation   . Coronary artery disease   . Dysrhythmia    SVT - ablation by Dr. Curt Bears on 05/21/2016 and episodes since  . Fatty liver   . GERD (gastroesophageal reflux disease)   . History of hiatal hernia    "very small"  . Hx of colonic polyp   . Hyperlipidemia   . Hypertension   . Joint pain   . Melanoma of eye, left ([place])    sees Dr. Cordelia Pen  . Myocardial infarction ([place])   . Osteoarthritis   . Shortness of breath   . Supraventricular tachycardia ([place])     PAST SURGICAL HISTORY: Past Surgical History:  Procedure Laterality Date  . ABDOMINAL HYSTERECTOMY    . ACHILLES TENDON REPAIR  10-07-11   torn on right, per Dr. Noemi Chapel   . ANKLE SURGERY     torn tendon left ankle  . BREAST BIOPSY    . CARDIAC CATHETERIZATION N/A 03/14/2016   Procedure: Left Heart Cath and Coronary Angiography;  Surgeon: Peter M Martinique, MD;  Location: [facility];  Service: Cardiovascular;  Laterality: N/A;  . CARDIAC CATHETERIZATION N/A 03/14/2016   Procedure: Coronary Stent Intervention;  Surgeon: Peter M Martinique, MD;  Location: [facility];  Service: Cardiovascular;  Laterality: N/A;  . CARPAL TUNNEL RELEASE    . CATARACT EXTRACTION W/ INTRAOCULAR LENS IMPLANT Bilateral   . CHOLECYSTECTOMY    . COLONOSCOPY  06-23-14   per Dr. Ardis Hughs, adenomatous polyps, repeat in 5 yrs    . ELECTROPHYSIOLOGIC STUDY N/A 05/21/2016   Procedure: SVT Ablation;  Surgeon: [name], MD;  Location: [facility];  Service: Cardiovascular;  Laterality: N/A;  . EYE SURGERY Left 08/2016  . KNEE CLOSED REDUCTION Left 02/03/2014   Procedure: CLOSED MANIPULATION LEFT KNEE;  Surgeon: Gearlean Alf, MD;  Location: [facility];  Service: Orthopedics;  Laterality: Left;  . POLYPECTOMY    . REVERSE SHOULDER ARTHROPLASTY     Left  . REVERSE SHOULDER ARTHROPLASTY Left 06/28/2017   Procedure: LEFT SHOULDER REVERSE SHOULDER ARTHROPLASTY;  Surgeon: Netta Cedars, MD;  Location: [facility];   Service: Orthopedics;  Laterality: Left;  . TOTAL KNEE ARTHROPLASTY Left 12/21/2013   Procedure: LEFT TOTAL KNEE ARTHROPLASTY;  Surgeon: Gearlean Alf, MD;  Location: [facility];  Service: Orthopedics;  Laterality: Left;  Marland Kitchen VARICOSE VEIN SURGERY      SOCIAL HISTORY: Social History   Tobacco Use  . Smoking status: Never Smoker  . Smokeless tobacco: Never Used  Substance Use Topics  . Alcohol use: No    Alcohol/week: 0.0 standard drinks  . Drug use: No    FAMILY HISTORY: Family History  Problem Relation Age of Onset  . Colon polyps Mother   . Diabetes Mother   . High blood pressure Mother   . High Cholesterol Mother   . Thyroid disease Mother   . Anxiety disorder Mother   . Colon cancer Maternal Aunt   . Liver cancer Maternal Aunt   . Breast cancer Unknown   . Coronary artery disease Unknown   . Colon cancer Unknown   . Diabetes Unknown   . Hypertension Unknown   . Kidney disease Unknown   . Lung cancer Unknown   . Lung cancer Brother        spleen, bone  . Diabetes Father   . High blood pressure Father   . High Cholesterol Father   . Heart disease Father   . Depression Father   . Obesity Father   . Esophageal cancer Neg Hx   . Stomach cancer Neg Hx   . Rectal cancer Neg Hx     ROS: Review of Systems  Constitutional: Positive for weight loss.  Respiratory: Negative for shortness of breath (on exertion).   Cardiovascular: Negative for chest pain.  Gastrointestinal: Positive for constipation. Negative for melena, nausea and vomiting.       Negative for hematochezia  Musculoskeletal:       Negative for muscle weakness  Endo/Heme/Allergies:       Negative for hypoglycemia    PHYSICAL EXAM: Blood pressure 130/77, pulse 76, temperature 97.9 F (36.6 C), temperature source Oral, height 5\' 4"  (1.626 m), weight 188 lb (85.3 kg), SpO2 97 %. Body mass index is 32.27 kg/m. Physical Exam Vitals signs reviewed.  Constitutional:      Appearance: Normal appearance. She  is well-developed. She is obese.  Cardiovascular:     Rate and Rhythm: Normal rate.  Pulmonary:     Effort: Pulmonary effort is normal.  Musculoskeletal: Normal range of motion.  Skin:    General: Skin is warm and dry.  Neurological:     Mental Status: She is alert and oriented to person, place, and time.  Psychiatric:        Mood and Affect: Mood normal.        Behavior: Behavior normal.     RECENT LABS AND TESTS: BMET    Component Value Date/Time   NA 135 11/20/2018 1413   K 4.4 11/20/2018 1413   CL 98 11/20/2018 1413   CO2 18 (L) 11/20/2018 1413   GLUCOSE 78 11/20/2018 1413   GLUCOSE 91 03/11/2018 0904   BUN 21 11/20/2018 1413   CREATININE 0.65 11/20/2018 1413   CREATININE 0.59 10/01/2016 0939   CALCIUM 9.3 11/20/2018 1413   GFRNONAA 92 11/20/2018 1413   GFRAA 105 11/20/2018 1413   Lab Results  Component Value Date   HGBA1C 5.7 (H) 11/20/2018   HGBA1C 5.9 (H) 08/04/2018   Lab Results  Component Value Date   INSULIN 15.4 11/20/2018   INSULIN 33.5 (H) 08/04/2018   CBC    Component Value Date/Time   WBC 6.1 03/11/2018 0904   RBC 4.66 03/11/2018 0904   HGB 12.9 03/11/2018 0904   HCT 38.4 03/11/2018 0904   PLT 250.0 03/11/2018 0904   MCV 82.3 03/11/2018 0904   MCH 28.7 06/25/2017 1140   MCHC 33.6 03/11/2018 0904   RDW 15.7 (H) 03/11/2018 0904   LYMPHSABS 2.1  03/11/2018 0904   MONOABS 0.5 03/11/2018 0904   EOSABS 0.3 03/11/2018 0904   BASOSABS 0.0 03/11/2018 0904   Iron/TIBC/Ferritin/ %Sat No results found for: IRON, TIBC, FERRITIN, IRONPCTSAT Lipid Panel     Component Value Date/Time   CHOL 99 (L) 09/30/2018 1108   TRIG 129 09/30/2018 1108   HDL 28 (L) 09/30/2018 1108   CHOLHDL 3.5 09/30/2018 1108   CHOLHDL 5 03/11/2018 0904   VLDL 46.8 (H) 03/11/2018 0904   LDLCALC 45 09/30/2018 1108   LDLDIRECT 96.0 03/11/2018 0904   Hepatic Function Panel     Component Value Date/Time   PROT 7.3 11/20/2018 1413   ALBUMIN 4.0 11/20/2018 1413   AST 21  11/20/2018 1413   ALT 15 11/20/2018 1413   ALKPHOS 71 11/20/2018 1413   BILITOT 0.3 11/20/2018 1413   BILIDIR 0.1 03/11/2018 0904   IBILI 0.3 07/09/2016 0905      Component Value Date/Time   TSH 1.120 08/04/2018 0921   TSH 1.49 03/11/2018 0904   TSH 1.64 02/11/2017 0851     Ref. Range 08/04/2018 09:21  Vitamin D, 25-Hydroxy Latest Ref Range: 30.0 - 100.0 ng/mL 43.5     OBESITY BEHAVIORAL INTERVENTION VISIT  Today's visit was # 8   Starting weight: 213 lbs Starting date: 08/04/2018 Today's weight : 188 lbs  Today's date: 11/24/2018 Total lbs lost to date: 25 At least 15 minutes were spent on discussing the following behavioral intervention visit.   ASK: We discussed the diagnosis of obesity with Natalie Hall today and Natalie Hall agreed to give [name] permission to discuss obesity behavioral modification therapy today.  ASSESS: Natalie Hall has the diagnosis of obesity and her BMI today is 47 Natalie Hall is in the action stage of change   ADVISE: Natalie Hall was educated on the multiple health risks of obesity as well as the benefit of weight loss to improve her health. She was advised of the need for long term treatment and the importance of lifestyle modifications to improve her current health and to decrease her risk of future health problems.  AGREE: Multiple dietary modification options and treatment options were discussed and  Natalie Hall agreed to follow the recommendations documented in the above note.  ARRANGE: Natalie Hall was educated on the importance of frequent visits to treat obesity as outlined per CMS and USPSTF guidelines and agreed to schedule her next follow up appointment today.  Corey Skains, am acting as Location manager for [employer]. Owens Shark, DO  I have reviewed the above documentation for accuracy and completeness, and I agree with the above. -Jearld Lesch, DO

## 2018-12-02 ENCOUNTER — Encounter: Payer: Self-pay | Admitting: Family Medicine

## 2018-12-03 ENCOUNTER — Encounter: Payer: Self-pay | Admitting: Family Medicine

## 2018-12-03 ENCOUNTER — Other Ambulatory Visit: Payer: Self-pay | Admitting: Family Medicine

## 2018-12-03 DIAGNOSIS — S22000A Wedge compression fracture of unspecified thoracic vertebra, initial encounter for closed fracture: Secondary | ICD-10-CM

## 2018-12-04 ENCOUNTER — Encounter (INDEPENDENT_AMBULATORY_CARE_PROVIDER_SITE_OTHER): Payer: Self-pay | Admitting: Family Medicine

## 2018-12-04 ENCOUNTER — Ambulatory Visit (INDEPENDENT_AMBULATORY_CARE_PROVIDER_SITE_OTHER): Payer: Medicare Other | Admitting: Family Medicine

## 2018-12-04 VITALS — BP 113/71 | HR 74 | Ht 64.0 in | Wt 184.0 lb

## 2018-12-04 DIAGNOSIS — Z6831 Body mass index (BMI) 31.0-31.9, adult: Secondary | ICD-10-CM

## 2018-12-04 DIAGNOSIS — E559 Vitamin D deficiency, unspecified: Secondary | ICD-10-CM | POA: Diagnosis not present

## 2018-12-04 DIAGNOSIS — R7303 Prediabetes: Secondary | ICD-10-CM

## 2018-12-04 DIAGNOSIS — E669 Obesity, unspecified: Secondary | ICD-10-CM | POA: Diagnosis not present

## 2018-12-04 NOTE — Progress Notes (Signed)
Office: [phone]  /  Fax: [phone]   HPI:   Chief Complaint: OBESITY Natalie Hall is here to discuss her progress with her obesity treatment plan. She is on the Category 2 plan and is following her eating plan approximately 85 % of the time. She states she is exercising 0 minutes 0 times per week. Natalie Hall is slightly bored with dinner options.  Her weight is 184 lb (83.5 kg) today and has had a weight loss of 4 pounds over a period of 3 weeks since her last visit. She has lost 29 lbs since starting treatment with [name].  Pre-Diabetes Natalie Hall has a diagnosis of pre-diabetes based on her elevated Hgb A1c and was informed this puts her at greater risk of developing diabetes. Her A1c decreased from to 5.9 to 5.7. She is not taking metformin currently and continues to work on diet and exercise to decrease risk of diabetes. She denies polyphagia.  Vitamin D deficiency Natalie Hall has a diagnosis of vitamin D deficiency. She is currently taking OTC vit D and is at goal. She is not over-replaced and she denies nausea, vomiting, or muscle weakness.  ASSESSMENT AND PLAN:  Prediabetes  Vitamin D deficiency  Class 1 obesity with serious comorbidity and body mass index (BMI) of 31.0 to 31.9 in adult, unspecified obesity type  PLAN:  Pre-Diabetes Natalie Hall will continue to work on weight loss, exercise, and decreasing simple carbohydrates in her diet to help decrease the risk of diabetes. She was informed that eating too many simple carbohydrates or too many calories at one sitting increases the likelihood of GI side effects. Natalie Hall agreed to continue her meal plan and to follow up with [name] as directed to monitor her progress.  Vitamin D Deficiency Natalie Hall was informed that low vitamin D levels contributes to fatigue and are associated with obesity, breast, and colon cancer. Natalie Hall agrees to continue to take OTC Vit D 62mcg every day and will follow up for routine testing of vitamin D, at least 2-3 times per  year. She was informed of the risk of over-replacement of vitamin D and agrees to not increase her dose unless she discusses this with [name] first. Natalie Hall agrees to follow up in 2 weeks as directed.  Obesity Natalie Hall is currently in the action stage of change. As such, her goal is to continue with weight loss efforts. She has agreed to follow the Category 2 plan. We discussed the following Behavioral Modification Strategies today: increasing lean protein intake and work on meal planning and easy cooking plans. has not been prescribed exercise at this time.  Natalie Hall has agreed to follow up with our clinic in 2 weeks. She was informed of the importance of frequent follow up visits to maximize her success with intensive lifestyle modifications for her multiple health conditions.  ALLERGIES: Allergies  Allergen Reactions  . Codeine Other (See Comments), Shortness Of Breath and Rash    flushing flushing   . Levofloxacin In D5w Other (See Comments)    Leg pain  Leg pain   . Amoxicillin   . Amoxicillin-Pot Clavulanate Rash    Has patient had a PCN reaction causing immediate rash, facial/tongue/throat swelling, SOB or lightheadedness with hypotension: No Has patient had a PCN reaction causing severe rash involving mucus membranes or skin necrosis: No Has patient had a PCN reaction that required hospitalization: No Has patient had a PCN reaction occurring within the last 10 years: Yes If all of the above answers are "NO", then may proceed with Cephalosporin  use.  Has patient had a PCN reaction causing immediate rash, facial/tongue/throat swelling, SOB or lightheadedness with hypotension: No Has patient had a PCN reaction causing severe rash involving mucus membranes or skin necrosis: No Has patient had a PCN reaction that required hospitalization: No Has patient had a PCN reaction occurring within the last 10 years: Yes If all of the above answers are "NO", then may proceed with Cephalosporin use.    . Escitalopram Oxalate Rash    MEDICATIONS: Current Outpatient Medications on File Prior to Visit  Medication Sig Dispense Refill  . acetaminophen (TYLENOL) 325 MG tablet Take 500 mg by mouth every 6 (six) hours as needed.    Marland Kitchen aspirin EC 81 MG tablet Take 81 mg by mouth daily.    . Calcium Carb-Cholecalciferol (CALCIUM PLUS VITAMIN D3) 600-800 MG-UNIT TABS Take by mouth.    . calcium carbonate (CALCIUM 600) 600 MG TABS tablet Take 600 mg by mouth 2 (two) times daily with a meal.    . Cholecalciferol (VITAMIN D3) 20 MCG (800 UNIT) TABS Take 1 tablet by mouth daily.    . cyclobenzaprine (FLEXERIL) 10 MG tablet Take 1 tablet (10 mg total) by mouth 3 (three) times daily as needed for muscle spasms. 60 tablet 0  . diphenhydramine-acetaminophen (TYLENOL PM) 25-500 MG TABS tablet Take 2 tablets by mouth at bedtime as needed (for pain.).    Marland Kitchen ezetimibe (ZETIA) 10 MG tablet Take 1 tablet (10 mg total) by mouth daily. 90 tablet 3  . HYDROcodone-acetaminophen (NORCO) 5-325 MG tablet Take 1 tablet by mouth every 4 (four) hours as needed for moderate pain. 30 tablet 0  . metoprolol succinate (TOPROL-XL) 25 MG 24 hr tablet TAKE 1 TABLET BY MOUTH  TWICE A DAY 180 tablet 2  . Multiple Vitamins-Minerals (MULTIVITAMIN PO) Take 1 tablet by mouth daily.     . nitrofurantoin, macrocrystal-monohydrate, (MACROBID) 100 MG capsule Take 1 capsule (100 mg total) by mouth 2 (two) times daily. 14 capsule 0  . nitroGLYCERIN (NITROSTAT) 0.4 MG SL tablet DISSOLVE 1 TABLET UNDER THE TONGUE EVERY 5 MINUTES FOR  3 DOSES AS NEEDED FOR CHEST PAIN 100 tablet 1  . omeprazole (PRILOSEC) 40 MG capsule TAKE 1 CAPSULE BY MOUTH  DAILY 90 capsule 1  . ramipril (ALTACE) 10 MG capsule TAKE 1 CAPSULE BY MOUTH  DAILY 90 capsule 2  . rosuvastatin (CRESTOR) 40 MG tablet TAKE 1 TABLET BY MOUTH  DAILY 90 tablet 2  . triamcinolone (NASACORT) 55 MCG/ACT nasal inhaler Place 2 sprays into the nose daily. 3 Inhaler 3   No current  facility-administered medications on file prior to visit.     PAST MEDICAL HISTORY: Past Medical History:  Diagnosis Date  . Asthma    infrequent problem - no treatment since 2006  . Back pain   . Choroidal nevus of left eye    sees Dr. Gerarda Fraction at [facility].   . Constipation   . Coronary artery disease   . Dysrhythmia    SVT - ablation by Dr. Curt Bears on 05/21/2016 and episodes since  . Fatty liver   . GERD (gastroesophageal reflux disease)   . History of hiatal hernia    "very small"  . Hx of colonic polyp   . Hyperlipidemia   . Hypertension   . Joint pain   . Melanoma of eye, left ([place])    sees Dr. Cordelia Pen  . Myocardial infarction ([place])   . Osteoarthritis   . Shortness of breath   .  Supraventricular tachycardia ([place])     PAST SURGICAL HISTORY: Past Surgical History:  Procedure Laterality Date  . ABDOMINAL HYSTERECTOMY    . ACHILLES TENDON REPAIR  10-07-11   torn on right, per Dr. Noemi Chapel   . ANKLE SURGERY     torn tendon left ankle  . BREAST BIOPSY    . CARDIAC CATHETERIZATION N/A 03/14/2016   Procedure: Left Heart Cath and Coronary Angiography;  Surgeon: Peter M Martinique, MD;  Location: [place] [facility];  Service: Cardiovascular;  Laterality: N/A;  . CARDIAC CATHETERIZATION N/A 03/14/2016   Procedure: Coronary Stent Intervention;  Surgeon: Peter M Martinique, MD;  Location: [place] [facility];  Service: Cardiovascular;  Laterality: N/A;  . CARPAL TUNNEL RELEASE    . CATARACT EXTRACTION W/ INTRAOCULAR LENS IMPLANT Bilateral   . CHOLECYSTECTOMY    . COLONOSCOPY  06-23-14   per Dr. Ardis Hughs, adenomatous polyps, repeat in 5 yrs    . ELECTROPHYSIOLOGIC STUDY N/A 05/21/2016   Procedure: SVT Ablation;  Surgeon: [name], MD;  Location: [place] [facility];  Service: Cardiovascular;  Laterality: N/A;  . EYE SURGERY Left 08/2016  . KNEE CLOSED REDUCTION Left 02/03/2014   Procedure: CLOSED MANIPULATION LEFT KNEE;  Surgeon: Gearlean Alf, MD;  Location: [facility];   Service: Orthopedics;  Laterality: Left;  . POLYPECTOMY    . REVERSE SHOULDER ARTHROPLASTY     Left  . REVERSE SHOULDER ARTHROPLASTY Left 06/28/2017   Procedure: LEFT SHOULDER REVERSE SHOULDER ARTHROPLASTY;  Surgeon: Netta Cedars, MD;  Location: [facility];  Service: Orthopedics;  Laterality: Left;  . TOTAL KNEE ARTHROPLASTY Left 12/21/2013   Procedure: LEFT TOTAL KNEE ARTHROPLASTY;  Surgeon: Gearlean Alf, MD;  Location: [facility];  Service: Orthopedics;  Laterality: Left;  Marland Kitchen VARICOSE VEIN SURGERY      SOCIAL HISTORY: Social History   Tobacco Use  . Smoking status: Never Smoker  . Smokeless tobacco: Never Used  Substance Use Topics  . Alcohol use: No    Alcohol/week: 0.0 standard drinks  . Drug use: No    FAMILY HISTORY: Family History  Problem Relation Age of Onset  . Colon polyps Mother   . Diabetes Mother   . High blood pressure Mother   . High Cholesterol Mother   . Thyroid disease Mother   . Anxiety disorder Mother   . Colon cancer Maternal Aunt   . Liver cancer Maternal Aunt   . Breast cancer Unknown   . Coronary artery disease Unknown   . Colon cancer Unknown   . Diabetes Unknown   . Hypertension Unknown   . Kidney disease Unknown   . Lung cancer Unknown   . Lung cancer Brother        spleen, bone  . Diabetes Father   . High blood pressure Father   . High Cholesterol Father   . Heart disease Father   . Depression Father   . Obesity Father   . Esophageal cancer Neg Hx   . Stomach cancer Neg Hx   . Rectal cancer Neg Hx     ROS: Review of Systems  Constitutional: Positive for weight loss.  Gastrointestinal: Negative for nausea and vomiting.  Musculoskeletal:       Negative for muscle weakness.  Endo/Heme/Allergies:       Negative for polyphagia.    PHYSICAL EXAM: Blood pressure 113/71, pulse 74, height 5\' 4"  (1.626 m), weight 184 lb (83.5 kg), SpO2 98 %. Body mass index is 31.58 kg/m. Physical Exam Vitals  signs reviewed.  Constitutional:       Appearance: Normal appearance. She is obese.  Cardiovascular:     Rate and Rhythm: Normal rate.  Pulmonary:     Effort: Pulmonary effort is normal.  Musculoskeletal: Normal range of motion.  Skin:    General: Skin is warm and dry.  Neurological:     Mental Status: She is alert and oriented to person, place, and time.  Psychiatric:        Mood and Affect: Mood normal.        Behavior: Behavior normal.     RECENT LABS AND TESTS: BMET    Component Value Date/Time   NA 135 11/20/2018 1413   K 4.4 11/20/2018 1413   CL 98 11/20/2018 1413   CO2 18 (L) 11/20/2018 1413   GLUCOSE 78 11/20/2018 1413   GLUCOSE 91 03/11/2018 0904   BUN 21 11/20/2018 1413   CREATININE 0.65 11/20/2018 1413   CREATININE 0.59 10/01/2016 0939   CALCIUM 9.3 11/20/2018 1413   GFRNONAA 92 11/20/2018 1413   GFRAA 105 11/20/2018 1413   Lab Results  Component Value Date   HGBA1C 5.7 (H) 11/20/2018   HGBA1C 5.9 (H) 08/04/2018   Lab Results  Component Value Date   INSULIN 15.4 11/20/2018   INSULIN 33.5 (H) 08/04/2018   CBC    Component Value Date/Time   WBC 6.1 03/11/2018 0904   RBC 4.66 03/11/2018 0904   HGB 12.9 03/11/2018 0904   HCT 38.4 03/11/2018 0904   PLT 250.0 03/11/2018 0904   MCV 82.3 03/11/2018 0904   MCH 28.7 06/25/2017 1140   MCHC 33.6 03/11/2018 0904   RDW 15.7 (H) 03/11/2018 0904   LYMPHSABS 2.1 03/11/2018 0904   MONOABS 0.5 03/11/2018 0904   EOSABS 0.3 03/11/2018 0904   BASOSABS 0.0 03/11/2018 0904   Iron/TIBC/Ferritin/ %Sat No results found for: IRON, TIBC, FERRITIN, IRONPCTSAT Lipid Panel     Component Value Date/Time   CHOL 99 (L) 09/30/2018 1108   TRIG 129 09/30/2018 1108   HDL 28 (L) 09/30/2018 1108   CHOLHDL 3.5 09/30/2018 1108   CHOLHDL 5 03/11/2018 0904   VLDL 46.8 (H) 03/11/2018 0904   LDLCALC 45 09/30/2018 1108   LDLDIRECT 96.0 03/11/2018 0904   Hepatic Function Panel     Component Value Date/Time   PROT 7.3 11/20/2018 1413   ALBUMIN 4.0 11/20/2018 1413    AST 21 11/20/2018 1413   ALT 15 11/20/2018 1413   ALKPHOS 71 11/20/2018 1413   BILITOT 0.3 11/20/2018 1413   BILIDIR 0.1 03/11/2018 0904   IBILI 0.3 07/09/2016 0905      Component Value Date/Time   TSH 1.120 08/04/2018 0921   TSH 1.49 03/11/2018 0904   TSH 1.64 02/11/2017 0851   Results for Natalie Hall, Natalie Hall (MRN [id number]) as of 12/04/2018 14:51  Ref. Range 11/20/2018 14:13  Vitamin D, 25-Hydroxy Latest Ref Range: 30.0 - 100.0 ng/mL 75.9    OBESITY BEHAVIORAL INTERVENTION VISIT  Today's visit was # 9  Starting weight: 213 lbs Starting date: 08/04/18 Today's weight : Weight: 184 lb (83.5 kg)  Today's date: 12/04/2018 Total lbs lost to date: 29 At least 15 minutes were spent on discussing the following behavioral intervention visit.  ASK: We discussed the diagnosis of obesity with Natalie Hall today and Natalie Hall agreed to give [name] permission to discuss obesity behavioral modification therapy today.  ASSESS: Natalie Hall has the diagnosis of obesity and her BMI today is 31.5. Natalie Hall is in the action stage  of change.   ADVISE: Linzi was educated on the multiple health risks of obesity as well as the benefit of weight loss to improve her health. She was advised of the need for long term treatment and the importance of lifestyle modifications to improve her current health and to decrease her risk of future health problems.  AGREE: Multiple dietary modification options and treatment options were discussed and [place] agreed to follow the recommendations documented in the above note.  ARRANGE: Ileta was educated on the importance of frequent visits to treat obesity as outlined per CMS and USPSTF guidelines and agreed to schedule her next follow up appointment today.  I, Marcille Blanco, CMA, am acting as Location manager for [employer], FNP-C.  I have reviewed the above documentation for accuracy and completeness, and I agree with the above.  - Shyana Kulakowski, FNP-C.

## 2018-12-09 ENCOUNTER — Encounter: Payer: Self-pay | Admitting: Radiology

## 2018-12-09 ENCOUNTER — Ambulatory Visit
Admission: RE | Admit: 2018-12-09 | Discharge: 2018-12-09 | Disposition: A | Discharge status: Home / Self Care | Payer: Medicare Other | Source: Ambulatory Visit | Attending: Family Medicine | Admitting: Family Medicine

## 2018-12-09 DIAGNOSIS — S22000A Wedge compression fracture of unspecified thoracic vertebra, initial encounter for closed fracture: Secondary | ICD-10-CM

## 2018-12-09 HISTORY — PX: IR RADIOLOGIST EVAL & MGMT: IMG5224

## 2018-12-09 NOTE — Consult Note (Signed)
Chief Complaint: Patient was seen in consultation today for  Chief Complaint  Patient presents with  . Consult    Consult for Kyphoplasty of T11 Compression Fracture   at the request of Fry,Stephen A  Referring Physician(s): Fry,Stephen A  History of Present Illness: Natalie Hall is a 68 y.o. female with recent fall approximately 1 month ago.  Patient was carrying things while going down steps and missed a step.  She has a history of low back pain and had physical therapy last year.  The back pain has worsened over the past month.  She is taking hydrocodone for pain control.  She is mostly taking the pain medicine at night.  Her symptoms are mostly when she is moving and the pain will bother her while sleeping as well.  She has difficulty localizing the pain and there appears to be acute on chronic component.  She denies pain, weakness or numbness in the lower extremities.  No breathing problems, no chest pain and no issues with her GI or GU tract.  Past Medical History:  Diagnosis Date  . Asthma    infrequent problem - no treatment since 2006  . Back pain   . Choroidal nevus of left eye    sees Dr. Gerarda Fraction at [facility].   . Constipation   . Coronary artery disease   . Dysrhythmia    SVT - ablation by Dr. Curt Bears on 05/21/2016 and episodes since  . Fatty liver   . GERD (gastroesophageal reflux disease)   . History of hiatal hernia    "very small"  . Hx of colonic polyp   . Hyperlipidemia   . Hypertension   . Joint pain   . Melanoma of eye, left ([place])    sees Dr. Cordelia Pen  . Myocardial infarction ([place])   . Osteoarthritis   . Shortness of breath   . Supraventricular tachycardia [facility])     Past Surgical History:  Procedure Laterality Date  . ABDOMINAL HYSTERECTOMY    . ACHILLES TENDON REPAIR  10-07-11   torn on right, per Dr. Noemi Chapel   . ANKLE SURGERY     torn tendon left ankle  . BREAST BIOPSY    . CARDIAC CATHETERIZATION N/A 03/14/2016   Procedure:  Left Heart Cath and Coronary Angiography;  Surgeon: Peter M Martinique, MD;  Location: [place] [facility];  Service: Cardiovascular;  Laterality: N/A;  . CARDIAC CATHETERIZATION N/A 03/14/2016   Procedure: Coronary Stent Intervention;  Surgeon: Peter M Martinique, MD;  Location: [place] [facility];  Service: Cardiovascular;  Laterality: N/A;  . CARPAL TUNNEL RELEASE    . CATARACT EXTRACTION W/ INTRAOCULAR LENS IMPLANT Bilateral   . CHOLECYSTECTOMY    . COLONOSCOPY  06-23-14   per Dr. Ardis Hughs, adenomatous polyps, repeat in 5 yrs    . ELECTROPHYSIOLOGIC STUDY N/A 05/21/2016   Procedure: SVT Ablation;  Surgeon: [name], MD;  Location: [place] [facility] LAB;  Service: Cardiovascular;  Laterality: N/A;  . EYE SURGERY Left 08/2016  . KNEE CLOSED REDUCTION Left 02/03/2014   Procedure: CLOSED MANIPULATION LEFT KNEE;  Surgeon: Gearlean Alf, MD;  Location: [facility];  Service: Orthopedics;  Laterality: Left;  . POLYPECTOMY    . REVERSE SHOULDER ARTHROPLASTY     Left  . REVERSE SHOULDER ARTHROPLASTY Left 06/28/2017   Procedure: LEFT SHOULDER REVERSE SHOULDER ARTHROPLASTY;  Surgeon: Netta Cedars, MD;  Location: [place];  Service: Orthopedics;  Laterality: Left;  . TOTAL KNEE ARTHROPLASTY  Left 12/21/2013   Procedure: LEFT TOTAL KNEE ARTHROPLASTY;  Surgeon: Gearlean Alf, MD;  Location: [facility];  Service: Orthopedics;  Laterality: Left;  Marland Kitchen VARICOSE VEIN SURGERY      Allergies: Codeine; Levofloxacin in d5w; Amoxicillin; Amoxicillin-pot clavulanate; and Escitalopram oxalate  Medications: Prior to Admission medications   Medication Sig Start Date End Date Taking? Authorizing Provider  acetaminophen (TYLENOL) 325 MG tablet Take 500 mg by mouth every 6 (six) hours as needed.    [provider]  aspirin EC 81 MG tablet Take 81 mg by mouth daily.    [provider]  Calcium Carb-Cholecalciferol (CALCIUM PLUS VITAMIN D3) 600-800 MG-UNIT TABS Take by mouth.    [provider]  calcium  carbonate (CALCIUM 600) 600 MG TABS tablet Take 600 mg by mouth 2 (two) times daily with a meal.    [provider]  Cholecalciferol (VITAMIN D3) 20 MCG (800 UNIT) TABS Take 1 tablet by mouth daily.    [provider]  cyclobenzaprine (FLEXERIL) 10 MG tablet Take 1 tablet (10 mg total) by mouth 3 (three) times daily as needed for muscle spasms. 11/12/18   Laurey Morale, MD  diphenhydramine-acetaminophen (TYLENOL PM) 25-500 MG TABS tablet Take 2 tablets by mouth at bedtime as needed (for pain.).    [provider]  ezetimibe (ZETIA) 10 MG tablet Take 1 tablet (10 mg total) by mouth daily. 10/03/18   Hilty, Nadean Corwin, MD  HYDROcodone-acetaminophen (NORCO) 5-325 MG tablet Take 1 tablet by mouth every 4 (four) hours as needed for moderate pain. 11/12/18   Laurey Morale, MD  metoprolol succinate (TOPROL-XL) 25 MG 24 hr tablet TAKE 1 TABLET BY MOUTH  TWICE A DAY 07/30/18   Hilty, Nadean Corwin, MD  Multiple Vitamins-Minerals (MULTIVITAMIN PO) Take 1 tablet by mouth daily.     [provider]  nitrofurantoin, macrocrystal-monohydrate, (MACROBID) 100 MG capsule Take 1 capsule (100 mg total) by mouth 2 (two) times daily. 09/09/18   Laurey Morale, MD  nitroGLYCERIN (NITROSTAT) 0.4 MG SL tablet DISSOLVE 1 TABLET UNDER THE TONGUE EVERY 5 MINUTES FOR  3 DOSES AS NEEDED FOR CHEST PAIN 11/16/16   Pixie Casino, MD  omeprazole (PRILOSEC) 40 MG capsule TAKE 1 CAPSULE BY MOUTH  DAILY 07/30/18   Laurey Morale, MD  ramipril (ALTACE) 10 MG capsule TAKE 1 CAPSULE BY MOUTH  DAILY 07/30/18   Hilty, Nadean Corwin, MD  rosuvastatin (CRESTOR) 40 MG tablet TAKE 1 TABLET BY MOUTH  DAILY 07/30/18   Hilty, Nadean Corwin, MD  triamcinolone (NASACORT) 55 MCG/ACT nasal inhaler Place 2 sprays into the nose daily. 01/29/13   Laurey Morale, MD     Family History  Problem Relation Age of Onset  . Colon polyps Mother   . Diabetes Mother   . High blood pressure Mother   . High Cholesterol Mother   . Thyroid  disease Mother   . Anxiety disorder Mother   . Colon cancer Maternal Aunt   . Liver cancer Maternal Aunt   . Breast cancer Unknown   . Coronary artery disease Unknown   . Colon cancer Unknown   . Diabetes Unknown   . Hypertension Unknown   . Kidney disease Unknown   . Lung cancer Unknown   . Lung cancer Brother        spleen, bone  . Diabetes Father   . High blood pressure Father   . High Cholesterol Father   . Heart disease Father   .  Depression Father   . Obesity Father   . Esophageal cancer Neg Hx   . Stomach cancer Neg Hx   . Rectal cancer Neg Hx     Social History   Socioeconomic History  . Marital status: Married    Spouse name: Katrina Stack  . Number of children: Not on file  . Years of education: Not on file  . Highest education level: Not on file  Occupational History  . Occupation: retired  Scientific laboratory technician  . Financial resource strain: Not on file  . Food insecurity:    Worry: Not on file    Inability: Not on file  . Transportation needs:    Medical: Not on file    Non-medical: Not on file  Tobacco Use  . Smoking status: Never Smoker  . Smokeless tobacco: Never Used  Substance and Sexual Activity  . Alcohol use: No    Alcohol/week: 0.0 standard drinks  . Drug use: No  . Sexual activity: Not on file  Lifestyle  . Physical activity:    Days per week: Not on file    Minutes per session: Not on file  . Stress: Not on file  Relationships  . Social connections:    Talks on phone: Not on file    Gets together: Not on file    Attends religious service: Not on file    Active member of club or organization: Not on file    Attends meetings of clubs or organizations: Not on file    Relationship status: Not on file  Other Topics Concern  . Not on file  Social History Narrative  . Not on file    Review of Systems  Constitutional: Positive for activity change.  Respiratory: Negative.   Cardiovascular: Negative.   Gastrointestinal: Negative.     Genitourinary: Negative.   Musculoskeletal: Positive for back pain.  Neurological: Negative for weakness and numbness.    Vital Signs: BP (!) 92/56   Pulse 81   Temp 97.8 F (36.6 C) (Oral)   Resp 15   Ht 5' 4.5" (1.638 m)   Wt 83.5 kg   LMP  (LMP Unknown)   SpO2 99%   BMI 31.10 kg/m   Physical Exam Vitals signs reviewed.  Cardiovascular:     Rate and Rhythm: Normal rate and regular rhythm.  Pulmonary:     Effort: Pulmonary effort is normal.     Breath sounds: Normal breath sounds.  Abdominal:     General: Abdomen is flat.     Palpations: Abdomen is soft.  Musculoskeletal:     Comments: Tenderness with palpation of the spinous processes in the lower thoracic spine and lumbar spine.  Focal areas of tenderness near the thoracolumbar junction and lumbosacral junction.  Neurological:     General: No focal deficit present.     Mallampati Score: 2     Imaging: Mr Thoracic Spine Wo Contrast  Result Date: 11/16/2018 CLINICAL DATA:  Fall. EXAM: MRI THORACIC AND LUMBAR SPINE WITHOUT CONTRAST TECHNIQUE: Multiplanar and multiecho pulse sequences of the thoracic and lumbar spine were obtained without intravenous contrast. COMPARISON:  None. FINDINGS: MRI THORACIC SPINE FINDINGS Alignment:  Physiologic. Vertebrae: Wedge compression fracture of T11 with about 25% height loss at the superior endplate. No retropulsion. Moderate bone marrow edema which extends into both pedicles. T3 hemangioma. Cord:  Normal signal and morphology. Paraspinal and other soft tissues: Negative. Disc levels: T2-3: Small central disc protrusion with narrowing of the ventral thecal sac. No spinal canal stenosis.  T7-8: Small central disc protrusion without spinal canal stenosis. The other thoracic disc levels are unremarkable. MRI LUMBAR SPINE FINDINGS Segmentation:  Standard segmentation Alignment:  Normal Vertebrae: Hemangioma at L1. No compression fracture or other acute abnormality. Schmorl's node at  superior L3 endplate. Conus medullaris and cauda equina: Conus extends to the L1 level. Conus and cauda equina appear normal. Paraspinal and other soft tissues: Negative. Disc levels: T12-L1: Normal. L1-2: Large central/right subarticular disc extrusion with minimal superior migration. There is right lateral recess stenosis without central spinal canal stenosis. No neural foraminal stenosis. L2-3: Small disc osteophyte complex, worst at the right neural foramen. Mild right foraminal stenosis and mild spinal canal stenosis. L3-4: Mild diffuse disc bulge with mild spinal canal stenosis. No neural foraminal stenosis. L4-5: Left eccentric disc bulge with narrowing of the left lateral recess. No central spinal canal or neural foraminal stenosis. L5-S1: Diffuse disc bulge with endplate spurring. Mild bilateral foraminal stenosis. No spinal canal stenosis. IMPRESSION: MR THORACIC SPINE IMPRESSION 1. Wedge compression fracture of T11 with 25% height loss and moderate bone marrow edema. No retropulsion or associated stenosis. 2. Otherwise mild thoracic degenerative disc disease. MR LUMBAR SPINE IMPRESSION 1. Multilevel lumbar degenerative disc disease without acute abnormality. 2. Mild spinal canal stenosis at L2-3 and L3-4 with narrowing of the left lateral recess at L4-5. 3. Mild right L2 and bilateral L5 neural foraminal stenosis. Electronically Signed   By: Ulyses Jarred [name]   On: 11/16/2018 23:37   Mr Lumbar Spine Wo Contrast  Result Date: 11/16/2018 CLINICAL DATA:  Fall. EXAM: MRI THORACIC AND LUMBAR SPINE WITHOUT CONTRAST TECHNIQUE: Multiplanar and multiecho pulse sequences of the thoracic and lumbar spine were obtained without intravenous contrast. COMPARISON:  None. FINDINGS: MRI THORACIC SPINE FINDINGS Alignment:  Physiologic. Vertebrae: Wedge compression fracture of T11 with about 25% height loss at the superior endplate. No retropulsion. Moderate bone marrow edema which extends into both pedicles. T3  hemangioma. Cord:  Normal signal and morphology. Paraspinal and other soft tissues: Negative. Disc levels: T2-3: Small central disc protrusion with narrowing of the ventral thecal sac. No spinal canal stenosis. T7-8: Small central disc protrusion without spinal canal stenosis. The other thoracic disc levels are unremarkable. MRI LUMBAR SPINE FINDINGS Segmentation:  Standard segmentation Alignment:  Normal Vertebrae: Hemangioma at L1. No compression fracture or other acute abnormality. Schmorl's node at superior L3 endplate. Conus medullaris and cauda equina: Conus extends to the L1 level. Conus and cauda equina appear normal. Paraspinal and other soft tissues: Negative. Disc levels: T12-L1: Normal. L1-2: Large central/right subarticular disc extrusion with minimal superior migration. There is right lateral recess stenosis without central spinal canal stenosis. No neural foraminal stenosis. L2-3: Small disc osteophyte complex, worst at the right neural foramen. Mild right foraminal stenosis and mild spinal canal stenosis. L3-4: Mild diffuse disc bulge with mild spinal canal stenosis. No neural foraminal stenosis. L4-5: Left eccentric disc bulge with narrowing of the left lateral recess. No central spinal canal or neural foraminal stenosis. L5-S1: Diffuse disc bulge with endplate spurring. Mild bilateral foraminal stenosis. No spinal canal stenosis. IMPRESSION: MR THORACIC SPINE IMPRESSION 1. Wedge compression fracture of T11 with 25% height loss and moderate bone marrow edema. No retropulsion or associated stenosis. 2. Otherwise mild thoracic degenerative disc disease. MR LUMBAR SPINE IMPRESSION 1. Multilevel lumbar degenerative disc disease without acute abnormality. 2. Mild spinal canal stenosis at L2-3 and L3-4 with narrowing of the left lateral recess at L4-5. 3. Mild right L2 and bilateral L5 neural foraminal  stenosis. Electronically Signed   By: Ulyses Jarred [name]   On: 11/16/2018 23:37     Labs:  CBC: Recent Labs    03/11/18 0904  WBC 6.1  HGB 12.9  HCT 38.4  PLT 250.0    COAGS: No results for input(s): INR, APTT in the last 8760 hours.  BMP: Recent Labs    03/11/18 0904 08/04/18 0921 11/20/18 1413  NA 136 138 135  K 4.4 4.8 4.4  CL 100 100 98  CO2 28 23 18*  GLUCOSE 91 90 78  BUN 16 17 21   CALCIUM 9.2 9.1 9.3  CREATININE 0.63 0.68 0.65  GFRNONAA  --  90 92  GFRAA  --  104 105    LIVER FUNCTION TESTS: Recent Labs    03/11/18 0904 08/04/18 0921 11/20/18 1413  BILITOT 0.4 0.3 0.3  AST 15 22 21   ALT 18 25 15   ALKPHOS 49 56 71  PROT 7.0 7.1 7.3  ALBUMIN 3.9 4.3 4.0    TUMOR MARKERS: No results for input(s): AFPTM, CEA, CA199, CHROMGRNA in the last 8760 hours.  Assessment and Plan:  68 year old with history of a fall approximately 1 month ago.  Patient has acute on chronic back pain which is not improving despite using narcotics.  MRI of the thoracic and lumbar spine on 11/16/2018 demonstrates a wedge compression fracture at T11 with 25% loss of height and no bone retropulsion.  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  On examination, the patient does have focal tenderness along the midline of the thoracolumbar spine but she also has tenderness throughout the lumbar spine.  She says that the pain is near her bra line and correlates with the T11 compression fracture.  Based on the MRI findings, history and physical examination, I think the patient would benefit from a T11 vertebral body augmentation procedure.  We discussed kyphoplasty procedure.  Patient is aware of the procedure because her mother has had the procedure done with good success.  Risks include bleeding, infection, damage to adjacent structures and nontarget placement of bone cement.  Patient is agreeable to procedure and would like to proceed with a T11 kyphoplasty.  We will schedule the patient for T11 kyphoplasty at the earliest availability.  Patient is comfortable  having one of my partners perform the procedure if it will expedite her treatment.  Thank you for this interesting consult.  I greatly enjoyed meeting Natalie Hall and look forward to participating in their care.  A copy of this report was sent to the requesting provider on this date.  Electronically Signed: Burman Riis 12/09/2018, 2:29 PM   I spent a total of  15 Minutes  in face to face in clinical consultation, greater than 50% of which was counseling/coordinating care for T11 vertebral body augmentation.

## 2018-12-11 ENCOUNTER — Other Ambulatory Visit: Payer: Self-pay | Admitting: Family Medicine

## 2018-12-11 ENCOUNTER — Other Ambulatory Visit (HOSPITAL_COMMUNITY): Payer: Self-pay | Admitting: Diagnostic Radiology

## 2018-12-11 DIAGNOSIS — S22080A Wedge compression fracture of T11-T12 vertebra, initial encounter for closed fracture: Secondary | ICD-10-CM

## 2018-12-12 NOTE — Telephone Encounter (Signed)
Dr. Fry please advise on refills. Thanks  

## 2018-12-12 NOTE — Telephone Encounter (Signed)
She will need a PMV for any Norco refills. Call in [place] #[phone] with 2 rf

## 2018-12-15 MED ORDER — CYCLOBENZAPRINE HCL 10 MG PO TABS: 10.0000 mg | ORAL_TABLET | Freq: Three times a day (TID) | ORAL | 2 refills | Status: DC | PRN

## 2018-12-15 NOTE — Telephone Encounter (Signed)
Flexeril called to the pharmacy and left on the VM for refills.

## 2018-12-17 ENCOUNTER — Other Ambulatory Visit: Payer: Self-pay | Admitting: Radiology

## 2018-12-18 ENCOUNTER — Ambulatory Visit (HOSPITAL_COMMUNITY)
Admission: RE | Admit: 2018-12-18 | Discharge: 2018-12-18 | Disposition: A | Discharge status: Home / Self Care | Payer: Medicare Other | Source: Ambulatory Visit | Attending: Diagnostic Radiology | Admitting: Diagnostic Radiology

## 2018-12-18 ENCOUNTER — Ambulatory Visit (INDEPENDENT_AMBULATORY_CARE_PROVIDER_SITE_OTHER): Payer: Medicare Other | Admitting: Family Medicine

## 2018-12-18 ENCOUNTER — Encounter (HOSPITAL_COMMUNITY): Payer: Self-pay

## 2018-12-18 DIAGNOSIS — Z7982 Long term (current) use of aspirin: Secondary | ICD-10-CM | POA: Insufficient documentation

## 2018-12-18 DIAGNOSIS — M1990 Unspecified osteoarthritis, unspecified site: Secondary | ICD-10-CM | POA: Insufficient documentation

## 2018-12-18 DIAGNOSIS — Z79899 Other long term (current) drug therapy: Secondary | ICD-10-CM | POA: Insufficient documentation

## 2018-12-18 DIAGNOSIS — K219 Gastro-esophageal reflux disease without esophagitis: Secondary | ICD-10-CM | POA: Diagnosis not present

## 2018-12-18 DIAGNOSIS — Z881 Allergy status to other antibiotic agents status: Secondary | ICD-10-CM | POA: Diagnosis not present

## 2018-12-18 DIAGNOSIS — Z90710 Acquired absence of both cervix and uterus: Secondary | ICD-10-CM | POA: Diagnosis not present

## 2018-12-18 DIAGNOSIS — Z880 Allergy status to penicillin: Secondary | ICD-10-CM | POA: Insufficient documentation

## 2018-12-18 DIAGNOSIS — I252 Old myocardial infarction: Secondary | ICD-10-CM | POA: Diagnosis not present

## 2018-12-18 DIAGNOSIS — E785 Hyperlipidemia, unspecified: Secondary | ICD-10-CM | POA: Diagnosis not present

## 2018-12-18 DIAGNOSIS — Z888 Allergy status to other drugs, medicaments and biological substances status: Secondary | ICD-10-CM | POA: Insufficient documentation

## 2018-12-18 DIAGNOSIS — K760 Fatty (change of) liver, not elsewhere classified: Secondary | ICD-10-CM | POA: Diagnosis not present

## 2018-12-18 DIAGNOSIS — Z955 Presence of coronary angioplasty implant and graft: Secondary | ICD-10-CM | POA: Insufficient documentation

## 2018-12-18 DIAGNOSIS — I2510 Atherosclerotic heart disease of native coronary artery without angina pectoris: Secondary | ICD-10-CM | POA: Insufficient documentation

## 2018-12-18 DIAGNOSIS — X58XXXA Exposure to other specified factors, initial encounter: Secondary | ICD-10-CM | POA: Diagnosis not present

## 2018-12-18 DIAGNOSIS — Z885 Allergy status to narcotic agent status: Secondary | ICD-10-CM | POA: Diagnosis not present

## 2018-12-18 DIAGNOSIS — S22080A Wedge compression fracture of T11-T12 vertebra, initial encounter for closed fracture: Secondary | ICD-10-CM | POA: Insufficient documentation

## 2018-12-18 DIAGNOSIS — I10 Essential (primary) hypertension: Secondary | ICD-10-CM | POA: Insufficient documentation

## 2018-12-18 DIAGNOSIS — Z8249 Family history of ischemic heart disease and other diseases of the circulatory system: Secondary | ICD-10-CM | POA: Diagnosis not present

## 2018-12-18 HISTORY — PX: IR KYPHO THORACIC WITH BONE BIOPSY: IMG5518

## 2018-12-18 LAB — CBC
HCT: 38.7 % (ref 36.0–46.0)
Hemoglobin: 12.3 g/dL (ref 12.0–15.0)
MCH: 26.6 pg (ref 26.0–34.0)
MCHC: 31.8 g/dL (ref 30.0–36.0)
MCV: 83.6 fL (ref 80.0–100.0)
Platelets: 184 10*3/uL (ref 150–400)
RBC: 4.63 MIL/uL (ref 3.87–5.11)
RDW: 14.3 % (ref 11.5–15.5)
WBC: 4.7 10*3/uL (ref 4.0–10.5)
nRBC: 0 % (ref 0.0–0.2)

## 2018-12-18 LAB — BASIC METABOLIC PANEL
ANION GAP: 8 (ref 5–15)
BUN: 18 mg/dL (ref 8–23)
CO2: 23 mmol/L (ref 22–32)
Calcium: 9 mg/dL (ref 8.9–10.3)
Chloride: 101 mmol/L (ref 98–111)
Creatinine, Ser: 0.6 mg/dL (ref 0.44–1.00)
GFR calc non Af Amer: >60 mL/min (ref >60)
Glucose, Bld: 87 mg/dL (ref 70–99)
Potassium: 4 mmol/L (ref 3.5–5.1)
Sodium: 132 mmol/L — ABNORMAL LOW (ref 135–145)

## 2018-12-18 MED ORDER — LIDOCAINE HCL 1 % IJ SOLN
INTRAMUSCULAR | Status: AC | PRN
  Administered 2018-12-18: 5 mL

## 2018-12-18 MED ORDER — MIDAZOLAM HCL 2 MG/2ML IJ SOLN
INTRAMUSCULAR | Status: AC | PRN
  Administered 2018-12-18 (×3): 1 mg via INTRAVENOUS

## 2018-12-18 MED ORDER — IOPAMIDOL (ISOVUE-300) INJECTION 61%
INTRAVENOUS | Status: AC
  Administered 2018-12-18: 10 mL
  Filled 2018-12-18: qty 50

## 2018-12-18 MED ORDER — SODIUM CHLORIDE 0.9 % IV SOLN
INTRAVENOUS | Status: AC | PRN
  Administered 2018-12-18: 50 mL/h via INTRAVENOUS

## 2018-12-18 MED ORDER — MIDAZOLAM HCL 2 MG/2ML IJ SOLN
INTRAMUSCULAR | Status: AC
  Filled 2018-12-18: qty 6

## 2018-12-18 MED ORDER — FENTANYL CITRATE (PF) 100 MCG/2ML IJ SOLN
INTRAMUSCULAR | Status: AC | PRN
  Administered 2018-12-18: 50 ug via INTRAVENOUS
  Administered 2018-12-18 (×2): 25 ug via INTRAVENOUS

## 2018-12-18 MED ORDER — VANCOMYCIN HCL IN DEXTROSE 1-5 GM/200ML-% IV SOLN
1000.0000 mg | INTRAVENOUS | Status: AC
  Administered 2018-12-18: 1000 mg via INTRAVENOUS

## 2018-12-18 MED ORDER — FENTANYL CITRATE (PF) 100 MCG/2ML IJ SOLN
INTRAMUSCULAR | Status: AC
  Filled 2018-12-18: qty 4

## 2018-12-18 MED ORDER — VANCOMYCIN HCL IN DEXTROSE 1-5 GM/200ML-% IV SOLN
INTRAVENOUS | Status: AC
  Filled 2018-12-18: qty 200

## 2018-12-18 MED ORDER — LIDOCAINE HCL (PF) 1 % IJ SOLN
INTRAMUSCULAR | Status: AC
  Filled 2018-12-18: qty 30

## 2018-12-18 MED ORDER — SODIUM CHLORIDE 0.9 % IV SOLN: INTRAVENOUS | Status: DC

## 2018-12-18 NOTE — Procedures (Signed)
Pre procedural Dx: Symptomatic compression fracture Post procedural Dx: Same  Technically successful cement augmentation of the T11 vertebral body.  Bed rest for 3 hours.  EBL: Trace Complications: None immediate.  Ronny Bacon, MD Pager #: [phone]

## 2018-12-18 NOTE — Sedation Documentation (Signed)
Patient is resting comfortably. 

## 2018-12-18 NOTE — Discharge Instructions (Signed)
KYPHOPLASTY/VERTEBROPLASTY DISCHARGE INSTRUCTIONS  Medications: (check all that apply)     Resume all home medications as before procedure.       Resume your (aspirin/Plavix/Coumadin) on today               Continue your pain medications as prescribed as needed.  Over the next 3-5 days, decrease your pain medication as tolerated.  Over the counter medications (i.e. Tylenol, ibuprofen, and aleve) may be substituted once severe/moderate pain symptoms have subsided.   Wound Care: - Bandages may be removed the day following your procedure.  You may get your incision wet once bandages are removed.  Bandaids may be used to cover the incisions until scab formation.  Topical ointments are optional.  - If you develop a fever greater than 101 degrees, have increased skin redness at the incision sites or pus-like oozing from incisions occurring within 1 week of the procedure, contact radiology at [phone] or [phone].  - Ice pack to back for 15-20 minutes 2-3 time per day for first 2-3 days post procedure.  The ice will expedite muscle healing and help with the pain from the incisions.   Activity: - Bedrest today with limited activity for 24 hours post procedure.  - No driving for 48 hours.  - Increase your activity as tolerated after bedrest (with assistance if necessary).  - Refrain from any strenuous activity or heavy lifting (greater than 10 lbs.).   Follow up: - Contact radiology at [phone] or [phone] if any questions/concerns.  - A physician assistant from radiology will contact you in approximately 1 week.  - If a biopsy was performed at the time of your procedure, your referring physician should receive the results in usually 2-3 days.

## 2018-12-18 NOTE — H&P (Signed)
Chief Complaint: Back pain  Referring Physician(s): Alysia Penna  Supervising Physician: Sandi Mariscal  Patient Status: [facility] - Out-pt  History of Present Illness: Natalie Hall is a 68 y.o. female  Who was seen by Dr. Anselm Pancoast in our outpatient clinic on 12/09/2018 for evaluation for possible kyphoplasty.  She fell approximately 1 month ago.    Patient was carrying things while going down steps and missed a step.    She has a history of low back pain and had physical therapy last year.    The back pain has worsened over the past month.    She is taking hydrocodone for pain control.    She is mostly taking the pain medicine at night.    Her symptoms are mostly when she is moving and the pain will bother her while sleeping as well.    She denies pain, weakness or numbness in the lower extremities.    MRI of the thoracic and lumbar spine on 11/16/2018 demonstrates a wedge compression fracture at T11 with 25% loss of height and no bone retropulsion.  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  She is here today for T 11 Kyphoplasty.  She is NPO.   Past Medical History:  Diagnosis Date  . Asthma    infrequent problem - no treatment since 2006  . Back pain   . Choroidal nevus of left eye    sees Dr. Gerarda Fraction at [facility].   . Constipation   . Coronary artery disease   . Dysrhythmia    SVT - ablation by Dr. Curt Bears on 05/21/2016 and episodes since  . Fatty liver   . GERD (gastroesophageal reflux disease)   . History of hiatal hernia    "very small"  . Hx of colonic polyp   . Hyperlipidemia   . Hypertension   . Joint pain   . Melanoma of eye, left ([place])    sees Dr. Cordelia Pen  . Myocardial infarction ([place])   . Osteoarthritis   . Shortness of breath   . Supraventricular tachycardia [facility])     Past Surgical History:  Procedure Laterality Date  . ABDOMINAL HYSTERECTOMY    . ACHILLES TENDON REPAIR  10-07-11   torn on right, per Dr. Noemi Chapel   . ANKLE  SURGERY     torn tendon left ankle  . BREAST BIOPSY    . CARDIAC CATHETERIZATION N/A 03/14/2016   Procedure: Left Heart Cath and Coronary Angiography;  Surgeon: Peter M Martinique, MD;  Location: [place] [facility];  Service: Cardiovascular;  Laterality: N/A;  . CARDIAC CATHETERIZATION N/A 03/14/2016   Procedure: Coronary Stent Intervention;  Surgeon: Peter M Martinique, MD;  Location: [place] [facility];  Service: Cardiovascular;  Laterality: N/A;  . CARPAL TUNNEL RELEASE    . CATARACT EXTRACTION W/ INTRAOCULAR LENS IMPLANT Bilateral   . CHOLECYSTECTOMY    . COLONOSCOPY  06-23-14   per Dr. Ardis Hughs, adenomatous polyps, repeat in 5 yrs    . ELECTROPHYSIOLOGIC STUDY N/A 05/21/2016   Procedure: SVT Ablation;  Surgeon: [name], MD;  Location: [place] [facility];  Service: Cardiovascular;  Laterality: N/A;  . EYE SURGERY Left 08/2016  . IR RADIOLOGIST EVAL & MGMT  12/09/2018  . KNEE CLOSED REDUCTION Left 02/03/2014   Procedure: CLOSED MANIPULATION LEFT KNEE;  Surgeon: Gearlean Alf, MD;  Location: [facility];  Service: Orthopedics;  Laterality: Left;  . POLYPECTOMY    . REVERSE SHOULDER ARTHROPLASTY  Left  . REVERSE SHOULDER ARTHROPLASTY Left 06/28/2017   Procedure: LEFT SHOULDER REVERSE SHOULDER ARTHROPLASTY;  Surgeon: Netta Cedars, MD;  Location: [place];  Service: Orthopedics;  Laterality: Left;  . TOTAL KNEE ARTHROPLASTY Left 12/21/2013   Procedure: LEFT TOTAL KNEE ARTHROPLASTY;  Surgeon: Gearlean Alf, MD;  Location: [facility];  Service: Orthopedics;  Laterality: Left;  Marland Kitchen VARICOSE VEIN SURGERY      Allergies: Codeine; Levofloxacin in d5w; Amoxicillin; Amoxicillin-pot clavulanate; and Escitalopram oxalate  Medications: Prior to Admission medications   Medication Sig Start Date End Date Taking? Authorizing Provider  acetaminophen (TYLENOL) 500 MG tablet Take 500 mg by mouth every 6 (six) hours as needed (pain).    Yes [provider]  aspirin EC 81 MG tablet Take 81 mg by mouth  daily.   Yes [provider]  Calcium Carb-Cholecalciferol (CALCIUM PLUS VITAMIN D3) 600-800 MG-UNIT TABS Take 2 tablets by mouth daily.    Yes [provider]  cetirizine (ZYRTEC) 10 MG tablet Take 10 mg by mouth daily as needed for allergies.   Yes [provider]  Cholecalciferol (VITAMIN D3) 25 MCG (1000 UT) CAPS Take 1,000 Units by mouth daily.    Yes [provider]  cyclobenzaprine (FLEXERIL) 10 MG tablet Take 1 tablet (10 mg total) by mouth 3 (three) times daily as needed for muscle spasms. 12/15/18  Yes Laurey Morale, MD  diphenhydramine-acetaminophen (TYLENOL PM) 25-500 MG TABS tablet Take 2 tablets by mouth at bedtime as needed (for pain.).   Yes [provider]  ezetimibe (ZETIA) 10 MG tablet Take 1 tablet (10 mg total) by mouth daily. 10/03/18  Yes Hilty, Nadean Corwin, MD  HYDROcodone-acetaminophen (NORCO) 5-325 MG tablet Take 1 tablet by mouth every 4 (four) hours as needed for moderate pain. 11/12/18  Yes Laurey Morale, MD  metoprolol succinate (TOPROL-XL) 25 MG 24 hr tablet TAKE 1 TABLET BY MOUTH  TWICE A DAY 07/30/18  Yes Hilty, Nadean Corwin, MD  nitroGLYCERIN (NITROSTAT) 0.4 MG SL tablet DISSOLVE 1 TABLET UNDER THE TONGUE EVERY 5 MINUTES FOR  3 DOSES AS NEEDED FOR CHEST PAIN Patient taking differently: Place 0.4 mg under the tongue every 5 (five) minutes as needed for chest pain.  11/16/16  Yes Pixie Casino, MD  omeprazole (PRILOSEC) 40 MG capsule TAKE 1 CAPSULE BY MOUTH  DAILY 07/30/18  Yes Laurey Morale, MD  ramipril (ALTACE) 10 MG capsule TAKE 1 CAPSULE BY MOUTH  DAILY 07/30/18  Yes Hilty, Nadean Corwin, MD  rosuvastatin (CRESTOR) 40 MG tablet TAKE 1 TABLET BY MOUTH  DAILY 07/30/18  Yes Hilty, Nadean Corwin, MD  triamcinolone (NASACORT) 55 MCG/ACT nasal inhaler Place 2 sprays into the nose daily. Patient taking differently: Place 2 sprays into the nose daily as needed (allergies).  01/29/13  Yes Laurey Morale, MD     Family History  Problem  Relation Age of Onset  . Colon polyps Mother   . Diabetes Mother   . High blood pressure Mother   . High Cholesterol Mother   . Thyroid disease Mother   . Anxiety disorder Mother   . Colon cancer Maternal Aunt   . Liver cancer Maternal Aunt   . Breast cancer Unknown   . Coronary artery disease Unknown   . Colon cancer Unknown   . Diabetes Unknown   . Hypertension Unknown   . Kidney disease Unknown   . Lung cancer Unknown   . Lung cancer Brother  spleen, bone  . Diabetes Father   . High blood pressure Father   . High Cholesterol Father   . Heart disease Father   . Depression Father   . Obesity Father   . Esophageal cancer Neg Hx   . Stomach cancer Neg Hx   . Rectal cancer Neg Hx     Social History   Socioeconomic History  . Marital status: Married    Spouse name: Katrina Stack  . Number of children: Not on file  . Years of education: Not on file  . Highest education level: Not on file  Occupational History  . Occupation: retired  Scientific laboratory technician  . Financial resource strain: Not on file  . Food insecurity:    Worry: Not on file    Inability: Not on file  . Transportation needs:    Medical: Not on file    Non-medical: Not on file  Tobacco Use  . Smoking status: Never Smoker  . Smokeless tobacco: Never Used  Substance and Sexual Activity  . Alcohol use: No    Alcohol/week: 0.0 standard drinks  . Drug use: No  . Sexual activity: Not on file  Lifestyle  . Physical activity:    Days per week: Not on file    Minutes per session: Not on file  . Stress: Not on file  Relationships  . Social connections:    Talks on phone: Not on file    Gets together: Not on file    Attends religious service: Not on file    Active member of club or organization: Not on file    Attends meetings of clubs or organizations: Not on file    Relationship status: Not on file  Other Topics Concern  . Not on file  Social History Narrative  . Not on file     Review of Systems: A 12  point ROS discussed and pertinent positives are indicated in the HPI above.  All other systems are negative.  Review of Systems  Vital Signs: BP 134/72   Pulse 71   Temp 98.3 F (36.8 C)   Ht 5\' 4"  (1.626 m)   Wt 83.5 kg   LMP  (LMP Unknown)   SpO2 97%   BMI 31.58 kg/m   Physical Exam Vitals signs reviewed.  Constitutional:      Appearance: Normal appearance.  HENT:     Head: Normocephalic and atraumatic.  Eyes:     Extraocular Movements: Extraocular movements intact.  Neck:     Musculoskeletal: Normal range of motion.  Cardiovascular:     Rate and Rhythm: Normal rate and regular rhythm.  Pulmonary:     Effort: Pulmonary effort is normal.     Breath sounds: Normal breath sounds.  Musculoskeletal: Normal range of motion.       Back:     Comments: Tenderness with palpation of the spinous processes in the lower thoracic spine and lumbar spine.  Focal areas of tenderness near the thoracolumbar junction and lumbosacral junction.   Skin:    General: Skin is warm and dry.  Neurological:     General: No focal deficit present.     Mental Status: She is alert and oriented to person, place, and time.  Psychiatric:        Mood and Affect: Mood normal.        Behavior: Behavior normal.        Thought Content: Thought content normal.        Judgment: Judgment normal.  Imaging: Ir Radiologist Eval & Mgmt  Result Date: 12/09/2018 Please refer to notes tab for details about interventional procedure. (Op Note)   Labs:  CBC: Recent Labs    03/11/18 0904  WBC 6.1  HGB 12.9  HCT 38.4  PLT 250.0    COAGS: No results for input(s): INR, APTT in the last 8760 hours.  BMP: Recent Labs    03/11/18 0904 08/04/18 0921 11/20/18 1413  NA 136 138 135  K 4.4 4.8 4.4  CL 100 100 98  CO2 28 23 18*  GLUCOSE 91 90 78  BUN 16 17 21   CALCIUM 9.2 9.1 9.3  CREATININE 0.63 0.68 0.65  GFRNONAA  --  90 92  GFRAA  --  104 105    LIVER FUNCTION TESTS: Recent Labs     03/11/18 0904 08/04/18 0921 11/20/18 1413  BILITOT 0.4 0.3 0.3  AST 15 22 21   ALT 18 25 15   ALKPHOS 49 56 71  PROT 7.0 7.1 7.3  ALBUMIN 3.9 4.3 4.0    TUMOR MARKERS: No results for input(s): AFPTM, CEA, CA199, CHROMGRNA in the last 8760 hours.  Assessment and Plan:  Severe back pain at T11.  Risks and benefits of kyphoplasty were discussed with the patient including, but not limited to education regarding the natural healing process of compression fractures without intervention, bleeding, infection, cement migration which may cause spinal cord damage, paralysis, pulmonary embolism or even death.  This interventional procedure involves the use of X-rays and because of the nature of the planned procedure, it is possible that we will have prolonged use of X-ray fluoroscopy.  Potential radiation risks to you include (but are not limited to) the following: - A slightly elevated risk for cancer  several years later in life. This risk is typically less than 0.5% percent. This risk is low in comparison to the normal incidence of human cancer, which is 33% for women and 50% for men according to the [employer]. - Radiation induced injury can include skin redness, resembling a rash, tissue breakdown / ulcers and hair loss (which can be temporary or permanent).   The likelihood of either of these occurring depends on the difficulty of the procedure and whether you are sensitive to radiation due to previous procedures, disease, or genetic conditions.   IF your procedure requires a prolonged use of radiation, you will be notified and given written instructions for further action.  It is your responsibility to monitor the irradiated area for the 2 weeks following the procedure and to notify your physician if you are concerned that you have suffered a radiation induced injury.    All of the patient's questions were answered, patient is agreeable to proceed.  Consent signed and in  chart.  Thank you for this interesting consult.  I greatly enjoyed meeting KWYNN SCHLOTTER and look forward to participating in their care.  A copy of this report was sent to the requesting provider on this date.  Electronically Signed: Murrell Redden, PA-C   12/18/2018, 8:03 AM      I spent a total of  25 Minutes in face to face in clinical consultation, greater than 50% of which was counseling/coordinating care for kyphoplasty.

## 2018-12-18 NOTE — Progress Notes (Addendum)
Lab called with hemolyzed PT/INR, needs to be repeated, pt has already been transported. Spoke with Benjamine Mola in [place] at [phone].  Also spoke with Loree Fee, RN at [phone] and she reports they will repeat.

## 2018-12-18 NOTE — Progress Notes (Signed)
HOB up 35 degrees, no complaints from patient. Dressing and ice pack to back remains intact.

## 2018-12-19 ENCOUNTER — Encounter: Payer: Self-pay | Admitting: Family Medicine

## 2018-12-19 DIAGNOSIS — M810 Age-related osteoporosis without current pathological fracture: Secondary | ICD-10-CM

## 2018-12-22 NOTE — Telephone Encounter (Signed)
Dr. Sarajane Jews please advise if ok to place the order for the appt set for 3/9

## 2018-12-22 NOTE — Telephone Encounter (Signed)
Please put in this order. Not sure if we fax it to them or what

## 2018-12-23 NOTE — Addendum Note (Signed)
Addended by: Elie Confer on: 12/23/2018 09:48 AM   Modules accepted: Orders

## 2018-12-25 ENCOUNTER — Ambulatory Visit (INDEPENDENT_AMBULATORY_CARE_PROVIDER_SITE_OTHER): Payer: Medicare Other | Admitting: Family Medicine

## 2018-12-25 VITALS — BP 125/73 | HR 75 | Temp 98.0°F | Ht 64.0 in | Wt 182.0 lb

## 2018-12-25 DIAGNOSIS — Z6831 Body mass index (BMI) 31.0-31.9, adult: Secondary | ICD-10-CM

## 2018-12-25 DIAGNOSIS — K5909 Other constipation: Secondary | ICD-10-CM | POA: Diagnosis not present

## 2018-12-25 DIAGNOSIS — E669 Obesity, unspecified: Secondary | ICD-10-CM | POA: Diagnosis not present

## 2018-12-25 DIAGNOSIS — R7303 Prediabetes: Secondary | ICD-10-CM | POA: Diagnosis not present

## 2018-12-29 ENCOUNTER — Other Ambulatory Visit: Payer: Self-pay | Admitting: Interventional Radiology

## 2018-12-29 ENCOUNTER — Encounter: Payer: Self-pay | Admitting: Family Medicine

## 2018-12-29 ENCOUNTER — Encounter (INDEPENDENT_AMBULATORY_CARE_PROVIDER_SITE_OTHER): Payer: Self-pay | Admitting: Family Medicine

## 2018-12-29 DIAGNOSIS — Z9889 Other specified postprocedural states: Secondary | ICD-10-CM

## 2018-12-29 NOTE — Progress Notes (Signed)
Office: [phone]  /  Fax: [phone]   HPI:   Chief Complaint: OBESITY Natalie Hall is here to discuss her progress with her obesity treatment plan. She is on the Category 2 plan and is following her eating plan approximately 70-80% of the time. She states she is exercising 0 minutes 0 times per week. Natalie Hall reports she has not eaten all of the food on the plan. She states she had kyphoplasty last week for a vertebral fracture which has gotten her off of her plan. Her weight is 182 lb (82.6 kg) today and has had a weight loss of 2 pounds over a period of 3 weeks since her last visit. She has lost 31 lbs since starting treatment with [name].  Pre-Diabetes Natalie Hall has a diagnosis of prediabetes based on her elevated Hgb A1c and was informed this puts her at greater risk of developing diabetes. She is not taking metformin currently and continues to work on diet and exercise to decrease risk of diabetes. She denies polyphagia. Lab Results  Component Value Date   HGBA1C 5.7 (H) 11/20/2018    Constipation Natalie Hall notes constipation for the last few weeks. She denies blood in her stool.   ASSESSMENT AND PLAN:  Prediabetes  Other constipation  Class 1 obesity with serious comorbidity and body mass index (BMI) of 31.0 to 31.9 in adult, unspecified obesity type  PLAN:  Pre-Diabetes Natalie Hall will continue to work on weight loss, exercise, and decreasing simple carbohydrates in her diet to help decrease the risk of diabetes. We dicussed metformin including benefits and risks. She was informed that eating too many simple carbohydrates or too many calories at one sitting increases the likelihood of GI side effects. Mende is not taking metformin for now and a prescription was not written today. Natalie Hall agreed to follow-up with [name] as directed to monitor her progress.  Constipation Natalie Hall was informed decrease bowel movement frequency is normal while losing weight, but stools should not be hard or  painful. She was advised to increase her H20 intake and continue taking Dulcolax. She will work on increasing her fiber intake. High fiber foods were discussed today.  Obesity Natalie Hall is currently in the action stage of change. As such, her goal is to continue with weight loss efforts. She has agreed to follow the Category 2 plan. She was advised to eat all food on the plan. We discussed the following Behavioral Modification Strategies today: increasing lean protein intake and planning for success.  Natalie Hall has agreed to follow-up with our clinic in 3 weeks. She was informed of the importance of frequent follow up visits to maximize her success with intensive lifestyle modifications for her multiple health conditions.  ALLERGIES: Allergies  Allergen Reactions  . Codeine Other (See Comments), Shortness Of Breath and Rash    flushing flushing   . Levofloxacin In D5w Other (See Comments)    Leg pain  Leg pain   . Amoxicillin   . Amoxicillin-Pot Clavulanate Rash    Has patient had a PCN reaction causing immediate rash, facial/tongue/throat swelling, SOB or lightheadedness with hypotension: No Has patient had a PCN reaction causing severe rash involving mucus membranes or skin necrosis: No Has patient had a PCN reaction that required hospitalization: No Has patient had a PCN reaction occurring within the last 10 years: Yes If all of the above answers are "NO", then may proceed with Cephalosporin use.  Has patient had a PCN reaction causing immediate rash, facial/tongue/throat swelling, SOB or lightheadedness with hypotension: No  Has patient had a PCN reaction causing severe rash involving mucus membranes or skin necrosis: No Has patient had a PCN reaction that required hospitalization: No Has patient had a PCN reaction occurring within the last 10 years: Yes If all of the above answers are "NO", then may proceed with Cephalosporin use.  . Escitalopram Oxalate Rash     MEDICATIONS: Current Outpatient Medications on File Prior to Visit  Medication Sig Dispense Refill  . acetaminophen (TYLENOL) 500 MG tablet Take 500 mg by mouth every 6 (six) hours as needed (pain).     Marland Kitchen aspirin EC 81 MG tablet Take 81 mg by mouth daily.    . Calcium Carb-Cholecalciferol (CALCIUM PLUS VITAMIN D3) 600-800 MG-UNIT TABS Take 2 tablets by mouth daily.     . cetirizine (ZYRTEC) 10 MG tablet Take 10 mg by mouth daily as needed for allergies.    . Cholecalciferol (VITAMIN D3) 25 MCG (1000 UT) CAPS Take 1,000 Units by mouth daily.     . cyclobenzaprine (FLEXERIL) 10 MG tablet Take 1 tablet (10 mg total) by mouth 3 (three) times daily as needed for muscle spasms. 60 tablet 2  . diphenhydramine-acetaminophen (TYLENOL PM) 25-500 MG TABS tablet Take 2 tablets by mouth at bedtime as needed (for pain.).    Marland Kitchen ezetimibe (ZETIA) 10 MG tablet Take 1 tablet (10 mg total) by mouth daily. 90 tablet 3  . HYDROcodone-acetaminophen (NORCO) 5-325 MG tablet Take 1 tablet by mouth every 4 (four) hours as needed for moderate pain. 30 tablet 0  . metoprolol succinate (TOPROL-XL) 25 MG 24 hr tablet TAKE 1 TABLET BY MOUTH  TWICE A DAY 180 tablet 2  . nitroGLYCERIN (NITROSTAT) 0.4 MG SL tablet DISSOLVE 1 TABLET UNDER THE TONGUE EVERY 5 MINUTES FOR  3 DOSES AS NEEDED FOR CHEST PAIN (Patient taking differently: Place 0.4 mg under the tongue every 5 (five) minutes as needed for chest pain. ) 100 tablet 1  . omeprazole (PRILOSEC) 40 MG capsule TAKE 1 CAPSULE BY MOUTH  DAILY 90 capsule 1  . ramipril (ALTACE) 10 MG capsule TAKE 1 CAPSULE BY MOUTH  DAILY 90 capsule 2  . rosuvastatin (CRESTOR) 40 MG tablet TAKE 1 TABLET BY MOUTH  DAILY 90 tablet 2  . triamcinolone (NASACORT) 55 MCG/ACT nasal inhaler Place 2 sprays into the nose daily. (Patient taking differently: Place 2 sprays into the nose daily as needed (allergies). ) 3 Inhaler 3   No current facility-administered medications on file prior to visit.      PAST MEDICAL HISTORY: Past Medical History:  Diagnosis Date  . Asthma    infrequent problem - no treatment since 2006  . Back pain   . Choroidal nevus of left eye    sees Dr. Gerarda Fraction at [facility].   . Constipation   . Coronary artery disease   . Dysrhythmia    SVT - ablation by Dr. Curt Bears on 05/21/2016 and episodes since  . Fatty liver   . GERD (gastroesophageal reflux disease)   . History of hiatal hernia    "very small"  . Hx of colonic polyp   . Hyperlipidemia   . Hypertension   . Joint pain   . Melanoma of eye, left ([place])    sees Dr. Cordelia Pen  . Myocardial infarction ([place])   . Osteoarthritis   . Shortness of breath   . Supraventricular tachycardia ([place])     PAST SURGICAL HISTORY: Past Surgical History:  Procedure Laterality Date  . ABDOMINAL HYSTERECTOMY    .  ACHILLES TENDON REPAIR  10-07-11   torn on right, per Dr. Noemi Chapel   . ANKLE SURGERY     torn tendon left ankle  . BREAST BIOPSY    . CARDIAC CATHETERIZATION N/A 03/14/2016   Procedure: Left Heart Cath and Coronary Angiography;  Surgeon: Peter M Martinique, MD;  Location: [place] [facility];  Service: Cardiovascular;  Laterality: N/A;  . CARDIAC CATHETERIZATION N/A 03/14/2016   Procedure: Coronary Stent Intervention;  Surgeon: Peter M Martinique, MD;  Location: [place] [facility];  Service: Cardiovascular;  Laterality: N/A;  . CARPAL TUNNEL RELEASE    . CATARACT EXTRACTION W/ INTRAOCULAR LENS IMPLANT Bilateral   . CHOLECYSTECTOMY    . COLONOSCOPY  06-23-14   per Dr. Ardis Hughs, adenomatous polyps, repeat in 5 yrs    . ELECTROPHYSIOLOGIC STUDY N/A 05/21/2016   Procedure: SVT Ablation;  Surgeon: [name], MD;  Location: [place] [facility];  Service: Cardiovascular;  Laterality: N/A;  . EYE SURGERY Left 08/2016  . IR KYPHO THORACIC WITH BONE BIOPSY  12/18/2018  . IR RADIOLOGIST EVAL & MGMT  12/09/2018  . KNEE CLOSED REDUCTION Left 02/03/2014   Procedure: CLOSED MANIPULATION LEFT KNEE;  Surgeon: Gearlean Alf, MD;  Location: [facility];  Service: Orthopedics;  Laterality: Left;  . POLYPECTOMY    . REVERSE SHOULDER ARTHROPLASTY     Left  . REVERSE SHOULDER ARTHROPLASTY Left 06/28/2017   Procedure: LEFT SHOULDER REVERSE SHOULDER ARTHROPLASTY;  Surgeon: Netta Cedars, MD;  Location: [facility];  Service: Orthopedics;  Laterality: Left;  . TOTAL KNEE ARTHROPLASTY Left 12/21/2013   Procedure: LEFT TOTAL KNEE ARTHROPLASTY;  Surgeon: Gearlean Alf, MD;  Location: [facility];  Service: Orthopedics;  Laterality: Left;  Marland Kitchen VARICOSE VEIN SURGERY      SOCIAL HISTORY: Social History   Tobacco Use  . Smoking status: Never Smoker  . Smokeless tobacco: Never Used  Substance Use Topics  . Alcohol use: No    Alcohol/week: 0.0 standard drinks  . Drug use: No    FAMILY HISTORY: Family History  Problem Relation Age of Onset  . Colon polyps Mother   . Diabetes Mother   . High blood pressure Mother   . High Cholesterol Mother   . Thyroid disease Mother   . Anxiety disorder Mother   . Colon cancer Maternal Aunt   . Liver cancer Maternal Aunt   . Breast cancer Unknown   . Coronary artery disease Unknown   . Colon cancer Unknown   . Diabetes Unknown   . Hypertension Unknown   . Kidney disease Unknown   . Lung cancer Unknown   . Lung cancer Brother        spleen, bone  . Diabetes Father   . High blood pressure Father   . High Cholesterol Father   . Heart disease Father   . Depression Father   . Obesity Father   . Esophageal cancer Neg Hx   . Stomach cancer Neg Hx   . Rectal cancer Neg Hx    ROS: Review of Systems  Constitutional: Positive for weight loss.  Gastrointestinal: Positive for constipation (no blood in stool).  Endo/Heme/Allergies:       Negative for polyphagia. Negative for hypoglycemia.   PHYSICAL EXAM: Blood pressure 125/73, pulse 75, temperature 98 F (36.7 C), temperature source Oral, height 5\' 4"  (1.626 m), weight 182 lb (82.6 kg), SpO2 97 %. Body mass index is 31.24  kg/m. Physical Exam Vitals signs reviewed.  Constitutional:  Appearance: Normal appearance. She is obese.  Cardiovascular:     Rate and Rhythm: Normal rate.     Pulses: Normal pulses.  Pulmonary:     Effort: Pulmonary effort is normal.     Breath sounds: Normal breath sounds.  Musculoskeletal: Normal range of motion.  Skin:    General: Skin is warm and dry.  Neurological:     Mental Status: She is alert and oriented to person, place, and time.  Psychiatric:        Behavior: Behavior normal.   RECENT LABS AND TESTS: BMET    Component Value Date/Time   NA 132 (L) 12/18/2018 0857   NA 135 11/20/2018 1413   K 4.0 12/18/2018 0857   CL 101 12/18/2018 0857   CO2 23 12/18/2018 0857   GLUCOSE 87 12/18/2018 0857   BUN 18 12/18/2018 0857   BUN 21 11/20/2018 1413   CREATININE 0.60 12/18/2018 0857   CREATININE 0.59 10/01/2016 0939   CALCIUM 9.0 12/18/2018 0857   GFRNONAA >60 12/18/2018 0857   GFRAA >60 12/18/2018 0857   Lab Results  Component Value Date   HGBA1C 5.7 (H) 11/20/2018   HGBA1C 5.9 (H) 08/04/2018   Lab Results  Component Value Date   INSULIN 15.4 11/20/2018   INSULIN 33.5 (H) 08/04/2018   CBC    Component Value Date/Time   WBC 4.7 12/18/2018 0857   RBC 4.63 12/18/2018 0857   HGB 12.3 12/18/2018 0857   HCT 38.7 12/18/2018 0857   PLT 184 12/18/2018 0857   MCV 83.6 12/18/2018 0857   MCH 26.6 12/18/2018 0857   MCHC 31.8 12/18/2018 0857   RDW 14.3 12/18/2018 0857   LYMPHSABS 2.1 03/11/2018 0904   MONOABS 0.5 03/11/2018 0904   EOSABS 0.3 03/11/2018 0904   BASOSABS 0.0 03/11/2018 0904   Iron/TIBC/Ferritin/ %Sat No results found for: IRON, TIBC, FERRITIN, IRONPCTSAT Lipid Panel     Component Value Date/Time   CHOL 99 (L) 09/30/2018 1108   TRIG 129 09/30/2018 1108   HDL 28 (L) 09/30/2018 1108   CHOLHDL 3.5 09/30/2018 1108   CHOLHDL 5 03/11/2018 0904   VLDL 46.8 (H) 03/11/2018 0904   LDLCALC 45 09/30/2018 1108   LDLDIRECT 96.0 03/11/2018 0904    Hepatic Function Panel     Component Value Date/Time   PROT 7.3 11/20/2018 1413   ALBUMIN 4.0 11/20/2018 1413   AST 21 11/20/2018 1413   ALT 15 11/20/2018 1413   ALKPHOS 71 11/20/2018 1413   BILITOT 0.3 11/20/2018 1413   BILIDIR 0.1 03/11/2018 0904   IBILI 0.3 07/09/2016 0905      Component Value Date/Time   TSH 1.120 08/04/2018 0921   TSH 1.49 03/11/2018 0904   TSH 1.64 02/11/2017 0851    Ref. Range 11/20/2018 14:13  Vitamin D, 25-Hydroxy Latest Ref Range: 30.0 - 100.0 ng/mL 75.9   OBESITY BEHAVIORAL INTERVENTION VISIT  Today's visit was #10  Starting weight: 213 lbs Starting date: 08/04/2018 Today's weight: 182 lbs Today's date: 12/25/2018 Total lbs lost to date: 31 At least 15 minutes were spent on discussing the following behavioral intervention visit.    12/25/2018  Height 5\' 4"  (1.626 m)  Weight 182 lb (82.6 kg)  BMI (Calculated) 31.22  BLOOD PRESSURE - SYSTOLIC 426  BLOOD PRESSURE - DIASTOLIC 73   Body Fat % 83.4 %  Total Body Water (lbs) 67.4 lbs   ASK: We discussed the diagnosis of obesity with [name] today and Crystel agreed to give [name] permission to  discuss obesity behavioral modification therapy today.  ASSESS: Sameena has the diagnosis of obesity and her BMI today is 31.22. Kimberle is in the action stage of change.   ADVISE: Rickelle was educated on the multiple health risks of obesity as well as the benefit of weight loss to improve her health. She was advised of the need for long term treatment and the importance of lifestyle modifications to improve her current health and to decrease her risk of future health problems.  AGREE: Multiple dietary modification options and treatment options were discussed and  Mahaila agreed to follow the recommendations documented in the above note.  ARRANGE: Mariaeduarda was educated on the importance of frequent visits to treat obesity as outlined per CMS and USPSTF guidelines and agreed to schedule her next follow up  appointment today.  IMichaelene Song, am acting as Location manager for [employer], FNP-C.  I have reviewed the above documentation for accuracy and completeness, and I agree with the above.  -  , FNP-C.

## 2019-01-13 ENCOUNTER — Encounter (INDEPENDENT_AMBULATORY_CARE_PROVIDER_SITE_OTHER): Payer: Self-pay

## 2019-01-14 ENCOUNTER — Ambulatory Visit (INDEPENDENT_AMBULATORY_CARE_PROVIDER_SITE_OTHER): Payer: Medicare Other | Admitting: Family Medicine

## 2019-01-14 ENCOUNTER — Other Ambulatory Visit: Payer: Medicare Other

## 2019-01-15 ENCOUNTER — Encounter (INDEPENDENT_AMBULATORY_CARE_PROVIDER_SITE_OTHER): Payer: Self-pay

## 2019-01-29 ENCOUNTER — Encounter (INDEPENDENT_AMBULATORY_CARE_PROVIDER_SITE_OTHER): Payer: Self-pay | Admitting: Family Medicine

## 2019-01-29 NOTE — Telephone Encounter (Signed)
FYI

## 2019-02-08 ENCOUNTER — Other Ambulatory Visit: Payer: Self-pay | Admitting: Family Medicine

## 2019-02-13 ENCOUNTER — Telehealth: Payer: Self-pay

## 2019-02-13 NOTE — Telephone Encounter (Signed)
Contacted pt to change appointment to virtual visit. Pt state she would prefer to bee seen in office. Appointment rescheduled to Aug 17 at 10:15 with Dr. Debara Pickett.

## 2019-02-17 ENCOUNTER — Ambulatory Visit: Payer: Medicare Other | Admitting: Internal Medicine

## 2019-03-19 ENCOUNTER — Ambulatory Visit (INDEPENDENT_AMBULATORY_CARE_PROVIDER_SITE_OTHER): Payer: Medicare Other | Admitting: Family Medicine

## 2019-03-19 ENCOUNTER — Encounter (INDEPENDENT_AMBULATORY_CARE_PROVIDER_SITE_OTHER): Payer: Self-pay | Admitting: Family Medicine

## 2019-03-19 ENCOUNTER — Other Ambulatory Visit: Payer: Self-pay

## 2019-03-19 DIAGNOSIS — R7303 Prediabetes: Secondary | ICD-10-CM

## 2019-03-19 DIAGNOSIS — Z6831 Body mass index (BMI) 31.0-31.9, adult: Secondary | ICD-10-CM | POA: Diagnosis not present

## 2019-03-19 DIAGNOSIS — E669 Obesity, unspecified: Secondary | ICD-10-CM | POA: Diagnosis not present

## 2019-03-22 NOTE — Progress Notes (Signed)
Office: [phone]  /  Fax: [phone] TeleHealth Visit:  Natalie Hall has verbally consented to this TeleHealth visit today. The patient is located at home, the provider is located at the [facility] office. The participants in this visit include the listed provider and patient. Mihaela was unable to use realtime audiovisual technology today and the telehealth visit was conducted via telephone. Call and prep time was 20 minutes.   HPI:   Chief Complaint: OBESITY Natalie Hall is here to discuss her progress with her obesity treatment plan. She is on the Category 2 plan and is following her eating plan approximately 60 % of the time. She states she is exercising 0 minutes 0 times per week however she has been busy working in the yard. Natalie Hall states she weighs 184 lbs today. She has gained 2 lbs since her last office visit on 12/25/2018. She weighs daily at home. She is trying to stick to the plan the best she can, but would like to come into the office for a visit. She struggles to get protein in.  We were unable to weigh the patient today for this TeleHealth visit. She feels as if she has gained 2 lbs since her last visit. She has lost 31 lbs since starting treatment with [name].  Pre-Diabetes Natalie Hall has a diagnosis of pre-diabetes based on her elevated Hgb A1c and was informed this puts her at greater risk of developing diabetes. She is not taking metformin currently and continues to work on diet and exercise to decrease risk of diabetes. She denies nausea or hypoglycemia. Lab Results  Component Value Date   HGBA1C 5.7 (H) 11/20/2018    ASSESSMENT AND PLAN:  Prediabetes  Class 1 obesity with serious comorbidity and body mass index (BMI) of 31.0 to 31.9 in adult, unspecified obesity type  PLAN:  Pre-Diabetes Natalie Hall will continue her meal plan, and she will continue to work on weight loss, exercise, and decreasing simple carbohydrates in her diet to help decrease the risk of  diabetes. Natalie Hall agrees to follow up with our clinic in 2 weeks as directed to monitor her progress.  I spent > than 50% of the 15 minute visit on counseling as documented in the note.  Obesity Natalie Hall is currently in the action stage of change. As such, her goal is to continue with weight loss efforts She has agreed to follow the Category 2 plan Natalie Hall has been instructed to continue yard work.  We discussed the following Behavioral Modification Strategies today: increasing lean protein intake and planning for success We have sent exchanges for 2 oz meat through MyChart.  Natalie Hall has agreed to follow up with our clinic in 2 weeks. She was informed of the importance of frequent follow up visits to maximize her success with intensive lifestyle modifications for her multiple health conditions.  ALLERGIES: Allergies  Allergen Reactions   Codeine Other (See Comments), Shortness Of Breath and Rash    flushing flushing    Levofloxacin In D5w Other (See Comments)    Leg pain  Leg pain    Amoxicillin    Amoxicillin-Pot Clavulanate Rash    Has patient had a PCN reaction causing immediate rash, facial/tongue/throat swelling, SOB or lightheadedness with hypotension: No Has patient had a PCN reaction causing severe rash involving mucus membranes or skin necrosis: No Has patient had a PCN reaction that required hospitalization: No Has patient had a PCN reaction occurring within the last 10 years: Yes If all of the above answers  are "NO", then may proceed with Cephalosporin use.  Has patient had a PCN reaction causing immediate rash, facial/tongue/throat swelling, SOB or lightheadedness with hypotension: No Has patient had a PCN reaction causing severe rash involving mucus membranes or skin necrosis: No Has patient had a PCN reaction that required hospitalization: No Has patient had a PCN reaction occurring within the last 10 years: Yes If all of the above answers are "NO", then may proceed  with Cephalosporin use.   Escitalopram Oxalate Rash    MEDICATIONS: Current Outpatient Medications on File Prior to Visit  Medication Sig Dispense Refill   acetaminophen (TYLENOL) 500 MG tablet Take 500 mg by mouth every 6 (six) hours as needed (pain).      aspirin EC 81 MG tablet Take 81 mg by mouth daily.     Calcium Carb-Cholecalciferol (CALCIUM PLUS VITAMIN D3) 600-800 MG-UNIT TABS Take 2 tablets by mouth daily.      cetirizine (ZYRTEC) 10 MG tablet Take 10 mg by mouth daily as needed for allergies.     Cholecalciferol (VITAMIN D3) 25 MCG (1000 UT) CAPS Take 1,000 Units by mouth daily.      cyclobenzaprine (FLEXERIL) 10 MG tablet Take 1 tablet (10 mg total) by mouth 3 (three) times daily as needed for muscle spasms. 60 tablet 2   diphenhydramine-acetaminophen (TYLENOL PM) 25-500 MG TABS tablet Take 2 tablets by mouth at bedtime as needed (for pain.).     ezetimibe (ZETIA) 10 MG tablet Take 1 tablet (10 mg total) by mouth daily. 90 tablet 3   HYDROcodone-acetaminophen (NORCO) 5-325 MG tablet Take 1 tablet by mouth every 4 (four) hours as needed for moderate pain. 30 tablet 0   metoprolol succinate (TOPROL-XL) 25 MG 24 hr tablet TAKE 1 TABLET BY MOUTH  TWICE A DAY 180 tablet 2   nitroGLYCERIN (NITROSTAT) 0.4 MG SL tablet DISSOLVE 1 TABLET UNDER THE TONGUE EVERY 5 MINUTES FOR  3 DOSES AS NEEDED FOR CHEST PAIN (Patient taking differently: Place 0.4 mg under the tongue every 5 (five) minutes as needed for chest pain. ) 100 tablet 1   omeprazole (PRILOSEC) 40 MG capsule TAKE 1 CAPSULE BY MOUTH  DAILY 90 capsule 1   ramipril (ALTACE) 10 MG capsule TAKE 1 CAPSULE BY MOUTH  DAILY 90 capsule 2   rosuvastatin (CRESTOR) 40 MG tablet TAKE 1 TABLET BY MOUTH  DAILY 90 tablet 2   triamcinolone (NASACORT) 55 MCG/ACT nasal inhaler Place 2 sprays into the nose daily. (Patient taking differently: Place 2 sprays into the nose daily as needed (allergies). ) 3 Inhaler 3   No current  facility-administered medications on file prior to visit.     PAST MEDICAL HISTORY: Past Medical History:  Diagnosis Date   Asthma    infrequent problem - no treatment since 2006   Back pain    Choroidal nevus of left eye    sees Dr. Gerarda Fraction at [facility].    Constipation    Coronary artery disease    Dysrhythmia    SVT - ablation by Dr. Curt Bears on 05/21/2016 and episodes since   Fatty liver    GERD (gastroesophageal reflux disease)    History of hiatal hernia    "very small"   Hx of colonic polyp    Hyperlipidemia    Hypertension    Joint pain    Melanoma of eye, left ([place])    sees Dr. Cordelia Pen   Myocardial infarction [facility])    Osteoarthritis    Shortness  of breath    Supraventricular tachycardia ([place])     PAST SURGICAL HISTORY: Past Surgical History:  Procedure Laterality Date   ABDOMINAL HYSTERECTOMY     ACHILLES TENDON REPAIR  10-07-11   torn on right, per Dr. Marge Duncans SURGERY     torn tendon left ankle   BREAST BIOPSY     CARDIAC CATHETERIZATION N/A 03/14/2016   Procedure: Left Heart Cath and Coronary Angiography;  Surgeon: Peter M Martinique, MD;  Location: [facility];  Service: Cardiovascular;  Laterality: N/A;   CARDIAC CATHETERIZATION N/A 03/14/2016   Procedure: Coronary Stent Intervention;  Surgeon: Peter M Martinique, MD;  Location: [facility];  Service: Cardiovascular;  Laterality: N/A;   CARPAL TUNNEL RELEASE     CATARACT EXTRACTION W/ INTRAOCULAR LENS IMPLANT Bilateral    CHOLECYSTECTOMY     COLONOSCOPY  06-23-14   per Dr. Ardis Hughs, adenomatous polyps, repeat in 5 yrs     ELECTROPHYSIOLOGIC STUDY N/A 05/21/2016   Procedure: SVT Ablation;  Surgeon: [name], MD;  Location: [facility];  Service: Cardiovascular;  Laterality: N/A;   EYE SURGERY Left 08/2016   IR KYPHO THORACIC WITH BONE BIOPSY  12/18/2018   IR RADIOLOGIST EVAL & MGMT  12/09/2018   KNEE CLOSED REDUCTION Left 02/03/2014    Procedure: CLOSED MANIPULATION LEFT KNEE;  Surgeon: Gearlean Alf, MD;  Location: [facility];  Service: Orthopedics;  Laterality: Left;   POLYPECTOMY     REVERSE SHOULDER ARTHROPLASTY     Left   REVERSE SHOULDER ARTHROPLASTY Left 06/28/2017   Procedure: LEFT SHOULDER REVERSE SHOULDER ARTHROPLASTY;  Surgeon: Netta Cedars, MD;  Location: [place];  Service: Orthopedics;  Laterality: Left;   TOTAL KNEE ARTHROPLASTY Left 12/21/2013   Procedure: LEFT TOTAL KNEE ARTHROPLASTY;  Surgeon: Gearlean Alf, MD;  Location: [facility];  Service: Orthopedics;  Laterality: Left;   VARICOSE VEIN SURGERY      SOCIAL HISTORY: Social History   Tobacco Use   Smoking status: Never Smoker   Smokeless tobacco: Never Used  Substance Use Topics   Alcohol use: No    Alcohol/week: 0.0 standard drinks   Drug use: No    FAMILY HISTORY: Family History  Problem Relation Age of Onset   Colon polyps Mother    Diabetes Mother    High blood pressure Mother    High Cholesterol Mother    Thyroid disease Mother    Anxiety disorder Mother    Colon cancer Maternal Aunt    Liver cancer Maternal Aunt    Breast cancer Unknown    Coronary artery disease Unknown    Colon cancer Unknown    Diabetes Unknown    Hypertension Unknown    Kidney disease Unknown    Lung cancer Unknown    Lung cancer Brother        spleen, bone   Diabetes Father    High blood pressure Father    High Cholesterol Father    Heart disease Father    Depression Father    Obesity Father    Esophageal cancer Neg Hx    Stomach cancer Neg Hx    Rectal cancer Neg Hx     ROS: Review of Systems  Constitutional: Negative for weight loss.  Gastrointestinal: Negative for nausea.  Endo/Heme/Allergies:       Negative hypoglycemia    PHYSICAL EXAM: Pt in no acute distress  RECENT LABS AND TESTS: BMET    Component Value Date/Time  NA 132 (L) 12/18/2018 0857   NA 135 11/20/2018 1413   K 4.0 12/18/2018 0857   CL  101 12/18/2018 0857   CO2 23 12/18/2018 0857   GLUCOSE 87 12/18/2018 0857   BUN 18 12/18/2018 0857   BUN 21 11/20/2018 1413   CREATININE 0.60 12/18/2018 0857   CREATININE 0.59 10/01/2016 0939   CALCIUM 9.0 12/18/2018 0857   GFRNONAA >60 12/18/2018 0857   GFRAA >60 12/18/2018 0857   Lab Results  Component Value Date   HGBA1C 5.7 (H) 11/20/2018   HGBA1C 5.9 (H) 08/04/2018   Lab Results  Component Value Date   INSULIN 15.4 11/20/2018   INSULIN 33.5 (H) 08/04/2018   CBC    Component Value Date/Time   WBC 4.7 12/18/2018 0857   RBC 4.63 12/18/2018 0857   HGB 12.3 12/18/2018 0857   HCT 38.7 12/18/2018 0857   PLT 184 12/18/2018 0857   MCV 83.6 12/18/2018 0857   MCH 26.6 12/18/2018 0857   MCHC 31.8 12/18/2018 0857   RDW 14.3 12/18/2018 0857   LYMPHSABS 2.1 03/11/2018 0904   MONOABS 0.5 03/11/2018 0904   EOSABS 0.3 03/11/2018 0904   BASOSABS 0.0 03/11/2018 0904   Iron/TIBC/Ferritin/ %Sat No results found for: IRON, TIBC, FERRITIN, IRONPCTSAT Lipid Panel     Component Value Date/Time   CHOL 99 (L) 09/30/2018 1108   TRIG 129 09/30/2018 1108   HDL 28 (L) 09/30/2018 1108   CHOLHDL 3.5 09/30/2018 1108   CHOLHDL 5 03/11/2018 0904   VLDL 46.8 (H) 03/11/2018 0904   LDLCALC 45 09/30/2018 1108   LDLDIRECT 96.0 03/11/2018 0904   Hepatic Function Panel     Component Value Date/Time   PROT 7.3 11/20/2018 1413   ALBUMIN 4.0 11/20/2018 1413   AST 21 11/20/2018 1413   ALT 15 11/20/2018 1413   ALKPHOS 71 11/20/2018 1413   BILITOT 0.3 11/20/2018 1413   BILIDIR 0.1 03/11/2018 0904   IBILI 0.3 07/09/2016 0905      Component Value Date/Time   TSH 1.120 08/04/2018 0921   TSH 1.49 03/11/2018 0904   TSH 1.64 02/11/2017 0851      I, Trixie Dredge, am acting as Location manager for [employer], FNP-C.  I have reviewed the above documentation for accuracy and completeness, and I agree with the above.  - Dawn Whitmire, FNP-C.

## 2019-03-23 ENCOUNTER — Encounter (INDEPENDENT_AMBULATORY_CARE_PROVIDER_SITE_OTHER): Payer: Self-pay | Admitting: Family Medicine

## 2019-03-30 ENCOUNTER — Other Ambulatory Visit: Payer: Self-pay

## 2019-03-30 ENCOUNTER — Encounter (INDEPENDENT_AMBULATORY_CARE_PROVIDER_SITE_OTHER): Payer: Self-pay | Admitting: Family Medicine

## 2019-03-30 ENCOUNTER — Ambulatory Visit (INDEPENDENT_AMBULATORY_CARE_PROVIDER_SITE_OTHER): Payer: Medicare Other | Admitting: Family Medicine

## 2019-03-30 DIAGNOSIS — Z6831 Body mass index (BMI) 31.0-31.9, adult: Secondary | ICD-10-CM | POA: Diagnosis not present

## 2019-03-30 DIAGNOSIS — E669 Obesity, unspecified: Secondary | ICD-10-CM | POA: Diagnosis not present

## 2019-03-30 DIAGNOSIS — R7303 Prediabetes: Secondary | ICD-10-CM

## 2019-03-31 ENCOUNTER — Other Ambulatory Visit: Payer: Self-pay

## 2019-03-31 ENCOUNTER — Encounter: Payer: Self-pay | Admitting: Family Medicine

## 2019-03-31 ENCOUNTER — Ambulatory Visit (INDEPENDENT_AMBULATORY_CARE_PROVIDER_SITE_OTHER): Payer: Medicare Other

## 2019-03-31 ENCOUNTER — Ambulatory Visit (INDEPENDENT_AMBULATORY_CARE_PROVIDER_SITE_OTHER): Payer: Medicare Other | Admitting: Family Medicine

## 2019-03-31 ENCOUNTER — Encounter: Payer: Self-pay | Admitting: *Deleted

## 2019-03-31 VITALS — BP 120/82 | HR 78 | Temp 97.5°F | Ht 65.0 in | Wt 185.4 lb

## 2019-03-31 DIAGNOSIS — Z0000 Encounter for general adult medical examination without abnormal findings: Secondary | ICD-10-CM | POA: Diagnosis not present

## 2019-03-31 DIAGNOSIS — Z85820 Personal history of malignant melanoma of skin: Secondary | ICD-10-CM

## 2019-03-31 DIAGNOSIS — R7303 Prediabetes: Secondary | ICD-10-CM | POA: Diagnosis not present

## 2019-03-31 LAB — CBC WITH DIFFERENTIAL/PLATELET
Basophils Absolute: 0 10*3/uL (ref 0.0–0.1)
Basophils Relative: 0.6 % (ref 0.0–3.0)
Eosinophils Absolute: 0.3 10*3/uL (ref 0.0–0.7)
Eosinophils Relative: 6.8 % — ABNORMAL HIGH (ref 0.0–5.0)
HCT: 38.5 % (ref 36.0–46.0)
Hemoglobin: 12.9 g/dL (ref 12.0–15.0)
Lymphocytes Relative: 32.1 % (ref 12.0–46.0)
Lymphs Abs: 1.6 10*3/uL (ref 0.7–4.0)
MCHC: 33.5 g/dL (ref 30.0–36.0)
MCV: 86.2 fl (ref 78.0–100.0)
Monocytes Absolute: 0.5 10*3/uL (ref 0.1–1.0)
Monocytes Relative: 9.2 % (ref 3.0–12.0)
Neutro Abs: 2.6 10*3/uL (ref 1.4–7.7)
Neutrophils Relative %: 51.3 % (ref 43.0–77.0)
Platelets: 211 10*3/uL (ref 150.0–400.0)
RBC: 4.47 Mil/uL (ref 3.87–5.11)
RDW: 14.2 % (ref 11.5–15.5)
WBC: 5.1 10*3/uL (ref 4.0–10.5)

## 2019-03-31 LAB — BASIC METABOLIC PANEL
BUN: 20 mg/dL (ref 6–23)
CO2: 28 mEq/L (ref 19–32)
Calcium: 9.2 mg/dL (ref 8.4–10.5)
Chloride: 97 mEq/L (ref 96–112)
Creatinine, Ser: 0.61 mg/dL (ref 0.40–1.20)
GFR: 97.28 mL/min (ref >60.00)
Glucose, Bld: 78 mg/dL (ref 70–99)
Potassium: 4.3 mEq/L (ref 3.5–5.1)
Sodium: 132 mEq/L — ABNORMAL LOW (ref 135–145)

## 2019-03-31 LAB — POC URINALSYSI DIPSTICK (AUTOMATED)
Bilirubin, UA: NEGATIVE
Glucose, UA: NEGATIVE
Ketones, UA: NEGATIVE
Leukocytes, UA: NEGATIVE
Nitrite, UA: NEGATIVE
Protein, UA: NEGATIVE
Spec Grav, UA: 1.01 (ref 1.010–1.025)
Urobilinogen, UA: 0.2 E.U./dL
pH, UA: 6 (ref 5.0–8.0)

## 2019-03-31 LAB — HEPATIC FUNCTION PANEL
ALT: 18 U/L (ref 0–35)
AST: 21 U/L (ref 0–37)
Albumin: 4 g/dL (ref 3.5–5.2)
Alkaline Phosphatase: 47 U/L (ref 39–117)
Bilirubin, Direct: 0.1 mg/dL (ref 0.0–0.3)
Total Bilirubin: 0.4 mg/dL (ref 0.2–1.2)
Total Protein: 7 g/dL (ref 6.0–8.3)

## 2019-03-31 LAB — HEMOGLOBIN A1C: Hgb A1c MFr Bld: 6 % (ref 4.6–6.5)

## 2019-03-31 LAB — LIPID PANEL
Cholesterol: 105 mg/dL (ref 0–200)
HDL: 26.2 mg/dL — ABNORMAL LOW (ref >39.00)
LDL Cholesterol: 49 mg/dL (ref 0–99)
NonHDL: 78.5
Total CHOL/HDL Ratio: 4
Triglycerides: 148 mg/dL (ref 0.0–149.0)
VLDL: 29.6 mg/dL (ref 0.0–40.0)

## 2019-03-31 LAB — TSH: TSH: 1.78 u[IU]/mL (ref 0.35–4.50)

## 2019-03-31 MED ORDER — TEMAZEPAM 15 MG PO CAPS: 15.0000 mg | ORAL_CAPSULE | Freq: Every evening | ORAL | 1 refills | Status: DC | PRN

## 2019-03-31 MED ORDER — OMEPRAZOLE 40 MG PO CPDR: 40.0000 mg | DELAYED_RELEASE_CAPSULE | Freq: Every day | ORAL | 3 refills | Status: DC

## 2019-03-31 NOTE — Progress Notes (Signed)
   Subjective:    Patient ID: Natalie Hall, female    DOB: 21-Nov-1950, 68 y.o.   MRN: [id number]  HPI Here for a well exam. She feels fine. She has been seeing [facility]athe NP [facility] the [facility] for prediabetes.    Review of Systems  Constitutional: Negative.   HENT: Negative.   Eyes: Negative.   Respiratory: Negative.   Cardiovascular: Negative.   Gastrointestinal: Negative.   Genitourinary: Negative for decreased urine volume, difficulty urinating, dyspareunia, dysuria, enuresis, flank pain, frequency, hematuria, pelvic pain and urgency.  Musculoskeletal: Negative.   Skin: Negative.   Neurological: Negative.   Psychiatric/Behavioral: Negative.        Objective:   Physical Exam Constitutional:      General: She is not in acute distress.    Appearance: She is well-developed.  HENT:     Head: Normocephalic and atraumatic.     Right Ear: External ear normal.     Left Ear: External ear normal.     Nose: Nose normal.     Mouth/Throat:     Pharynx: No oropharyngeal exudate.  Eyes:     General: No scleral icterus.    Conjunctiva/sclera: Conjunctivae normal.     Pupils: Pupils are equal, round, and reactive to light.  Neck:     Musculoskeletal: Normal range of motion and neck supple.     Thyroid: No thyromegaly.     Vascular: No JVD.  Cardiovascular:     Rate and Rhythm: Normal rate and regular rhythm.     Heart sounds: Normal heart sounds. No murmur. No friction rub. No gallop.   Pulmonary:     Effort: Pulmonary effort is normal. No respiratory distress.     Breath sounds: Normal breath sounds. No wheezing or rales.  Chest:     Chest Faivre: No tenderness.  Abdominal:     General: Bowel sounds are normal. There is no distension.     Palpations: Abdomen is soft. There is no mass.     Tenderness: There is no abdominal tenderness. There is no guarding or rebound.  Musculoskeletal: Normal range of motion.        General: No tenderness.  Lymphadenopathy:   Cervical: No cervical adenopathy.  Skin:    General: Skin is warm and dry.     Findings: No erythema or rash.  Neurological:     Mental Status: She is alert and oriented to person, place, and time.     Cranial Nerves: No cranial nerve deficit.     Motor: No abnormal muscle tone.     Coordination: Coordination normal.     Deep Tendon Reflexes: Reflexes are normal and symmetric. Reflexes normal.  Psychiatric:        Behavior: Behavior normal.        Thought Content: Thought content normal.        Judgment: Judgment normal.           Assessment & Plan:  Well exam. We discussed diet and exercise. Get fasting labs. Get a CXR and an abdominal US to follow up on melanoma screening. She is due for a colonoscopy in September.  Alysia Penna, MD

## 2019-04-01 LAB — INSULIN, RANDOM: Insulin: 13.6 u[IU]/mL

## 2019-04-01 NOTE — Progress Notes (Signed)
Office: [phone]  /  Fax: [phone] TeleHealth Visit:  Natalie Hall has verbally consented to this TeleHealth visit today. The patient is located at home, the provider is located at the [employer] [facility] office. The participants in this visit include the listed provider and patient and any and all parties involved. The visit was conducted today via telephone. Natalie Hall was unable to use realtime audiovisual technology today and the telehealth visit was conducted via telephone (15 minute call).  HPI:   Chief Complaint: OBESITY Natalie Hall is here to discuss her progress with her obesity treatment plan. She is on the Category 2 plan and is following her eating plan approximately 90 % of the time. She states she is exercising 0 minutes 0 times per week. Natalie Hall weighs 181 pounds today and she has lost 3 pounds since her last telehealth visit. Natalie Hall is eating all of the protein on the plan. We were unable to weigh the patient today for this TeleHealth visit. She feels as if she has lost weight since her last visit. She has lost 32 lbs since starting treatment with [name].  Pre-Diabetes Natalie Hall has a diagnosis of prediabetes based on her elevated Hgb A1c and was informed this puts her at greater risk of developing diabetes. She is not taking metformin currently and continues to work on diet and exercise to decrease risk of diabetes. She admits to polyphagia in the afternoon. Lab Results  Component Value Date   HGBA1C 6.0 03/31/2019    ASSESSMENT AND PLAN:  Prediabetes  Class 1 obesity with serious comorbidity and body mass index (BMI) of 31.0 to 31.9 in adult, unspecified obesity type  PLAN:  Pre-Diabetes Natalie Hall will continue to work on weight loss, exercise, and decreasing simple carbohydrates in her diet to help decrease the risk of diabetes. We dicussed metformin including benefits and risks.Natalie Hall declined metformin for now and a prescription was not written today. Natalie Hall agreed  to follow up with [name] as directed to monitor her progress.  I spent > than 50% of the 15 minute visit on counseling as documented in the note.  Obesity Natalie Hall is currently in the action stage of change. As such, her goal is to continue with weight loss efforts She has agreed to follow the Category 2 plan Natalie Hall has not been prescribed exercise at this time.  We discussed the following Behavioral Modification Strategies today: planning for success and increasing lean protein intake  We discussed having a protein snack in the afternoon.  Natalie Hall has agreed to follow up with our clinic in 2 weeks. She was informed of the importance of frequent follow up visits to maximize her success with intensive lifestyle modifications for her multiple health conditions.  ALLERGIES: Allergies  Allergen Reactions  . Codeine Other (See Comments), Shortness Of Breath and Rash    flushing flushing   . Levofloxacin In D5w Other (See Comments)    Leg pain  Leg pain   . Amoxicillin   . Amoxicillin-Pot Clavulanate Rash    Has patient had a PCN reaction causing immediate rash, facial/tongue/throat swelling, SOB or lightheadedness with hypotension: No Has patient had a PCN reaction causing severe rash involving mucus membranes or skin necrosis: No Has patient had a PCN reaction that required hospitalization: No Has patient had a PCN reaction occurring within the last 10 years: Yes If all of the above answers are "NO", then may proceed with Cephalosporin use.  Has patient had a PCN reaction causing immediate rash, facial/tongue/throat swelling,  SOB or lightheadedness with hypotension: No Has patient had a PCN reaction causing severe rash involving mucus membranes or skin necrosis: No Has patient had a PCN reaction that required hospitalization: No Has patient had a PCN reaction occurring within the last 10 years: Yes If all of the above answers are "NO", then may proceed with Cephalosporin use.  .  Escitalopram Oxalate Rash    MEDICATIONS: Current Outpatient Medications on File Prior to Visit  Medication Sig Dispense Refill  . acetaminophen (TYLENOL) 500 MG tablet Take 500 mg by mouth every 6 (six) hours as needed (pain).     Marland Kitchen aspirin EC 81 MG tablet Take 81 mg by mouth daily.    . Calcium Carb-Cholecalciferol (CALCIUM PLUS VITAMIN D3) 600-800 MG-UNIT TABS Take 2 tablets by mouth daily.     . cetirizine (ZYRTEC) 10 MG tablet Take 10 mg by mouth daily as needed for allergies.    . Cholecalciferol (VITAMIN D3) 25 MCG (1000 UT) CAPS Take 1,000 Units by mouth daily.     . cyclobenzaprine (FLEXERIL) 10 MG tablet Take 1 tablet (10 mg total) by mouth 3 (three) times daily as needed for muscle spasms. 60 tablet 2  . diphenhydramine-acetaminophen (TYLENOL PM) 25-500 MG TABS tablet Take 2 tablets by mouth at bedtime as needed (for pain.).    Marland Kitchen ezetimibe (ZETIA) 10 MG tablet Take 1 tablet (10 mg total) by mouth daily. 90 tablet 3  . HYDROcodone-acetaminophen (NORCO) 5-325 MG tablet Take 1 tablet by mouth every 4 (four) hours as needed for moderate pain. 30 tablet 0  . metoprolol succinate (TOPROL-XL) 25 MG 24 hr tablet TAKE 1 TABLET BY MOUTH  TWICE A DAY 180 tablet 2  . nitroGLYCERIN (NITROSTAT) 0.4 MG SL tablet DISSOLVE 1 TABLET UNDER THE TONGUE EVERY 5 MINUTES FOR  3 DOSES AS NEEDED FOR CHEST PAIN (Patient taking differently: Place 0.4 mg under the tongue every 5 (five) minutes as needed for chest pain. ) 100 tablet 1  . ramipril (ALTACE) 10 MG capsule TAKE 1 CAPSULE BY MOUTH  DAILY 90 capsule 2  . rosuvastatin (CRESTOR) 40 MG tablet TAKE 1 TABLET BY MOUTH  DAILY 90 tablet 2  . triamcinolone (NASACORT) 55 MCG/ACT nasal inhaler Place 2 sprays into the nose daily. (Patient taking differently: Place 2 sprays into the nose daily as needed (allergies). ) 3 Inhaler 3   No current facility-administered medications on file prior to visit.     PAST MEDICAL HISTORY: Past Medical History:  Diagnosis  Date  . Asthma    infrequent problem - no treatment since 2006  . Back pain   . Choroidal nevus of left eye    sees Dr. Gerarda Fraction at [facility].   . Constipation   . Coronary artery disease   . Dysrhythmia    SVT - ablation by Dr. Curt Bears on 05/21/2016 and episodes since  . Fatty liver   . GERD (gastroesophageal reflux disease)   . History of hiatal hernia    "very small"  . Hx of colonic polyp   . Hyperlipidemia   . Hypertension   . Joint pain   . Melanoma of eye, left ([place])    sees Dr. Cordelia Pen  . Myocardial infarction ([place])   . Osteoarthritis   . Shortness of breath   . Supraventricular tachycardia ([place])     PAST SURGICAL HISTORY: Past Surgical History:  Procedure Laterality Date  . ABDOMINAL HYSTERECTOMY    . ACHILLES TENDON REPAIR  10-07-11  torn on right, per Dr. Noemi Chapel   . ANKLE SURGERY     torn tendon left ankle  . BREAST BIOPSY    . CARDIAC CATHETERIZATION N/A 03/14/2016   Procedure: Left Heart Cath and Coronary Angiography;  Surgeon: Peter M Martinique, MD;  Location: [place] [facility];  Service: Cardiovascular;  Laterality: N/A;  . CARDIAC CATHETERIZATION N/A 03/14/2016   Procedure: Coronary Stent Intervention;  Surgeon: Peter M Martinique, MD;  Location: [place] [facility];  Service: Cardiovascular;  Laterality: N/A;  . CARPAL TUNNEL RELEASE    . CATARACT EXTRACTION W/ INTRAOCULAR LENS IMPLANT Bilateral   . CHOLECYSTECTOMY    . COLONOSCOPY  06-23-14   per Dr. Ardis Hughs, adenomatous polyps, repeat in 5 yrs    . ELECTROPHYSIOLOGIC STUDY N/A 05/21/2016   Procedure: SVT Ablation;  Surgeon: [name], MD;  Location: [place] [facility];  Service: Cardiovascular;  Laterality: N/A;  . EYE SURGERY Left 08/2016  . IR KYPHO THORACIC WITH BONE BIOPSY  12/18/2018  . IR RADIOLOGIST EVAL & MGMT  12/09/2018  . KNEE CLOSED REDUCTION Left 02/03/2014   Procedure: CLOSED MANIPULATION LEFT KNEE;  Surgeon: Gearlean Alf, MD;  Location: [facility];  Service: Orthopedics;   Laterality: Left;  . POLYPECTOMY    . REVERSE SHOULDER ARTHROPLASTY     Left  . REVERSE SHOULDER ARTHROPLASTY Left 06/28/2017   Procedure: LEFT SHOULDER REVERSE SHOULDER ARTHROPLASTY;  Surgeon: Netta Cedars, MD;  Location: [place];  Service: Orthopedics;  Laterality: Left;  . TOTAL KNEE ARTHROPLASTY Left 12/21/2013   Procedure: LEFT TOTAL KNEE ARTHROPLASTY;  Surgeon: Gearlean Alf, MD;  Location: [facility];  Service: Orthopedics;  Laterality: Left;  Marland Kitchen VARICOSE VEIN SURGERY      SOCIAL HISTORY: Social History   Tobacco Use  . Smoking status: Never Smoker  . Smokeless tobacco: Never Used  Substance Use Topics  . Alcohol use: No    Alcohol/week: 0.0 standard drinks  . Drug use: No    FAMILY HISTORY: Family History  Problem Relation Age of Onset  . Colon polyps Mother   . Diabetes Mother   . High blood pressure Mother   . High Cholesterol Mother   . Thyroid disease Mother   . Anxiety disorder Mother   . Colon cancer Maternal Aunt   . Liver cancer Maternal Aunt   . Breast cancer Other   . Coronary artery disease Other   . Colon cancer Other   . Diabetes Other   . Hypertension Other   . Kidney disease Other   . Lung cancer Other   . Lung cancer Brother        spleen, bone  . Diabetes Father   . High blood pressure Father   . High Cholesterol Father   . Heart disease Father   . Depression Father   . Obesity Father   . Esophageal cancer Neg Hx   . Stomach cancer Neg Hx   . Rectal cancer Neg Hx     ROS: Review of Systems  Constitutional: Positive for weight loss.  Endo/Heme/Allergies:       Positive for polyphagia    PHYSICAL EXAM: Pt in no acute distress  RECENT LABS AND TESTS: BMET    Component Value Date/Time   NA 132 (L) 03/31/2019 0934   NA 135 11/20/2018 1413   K 4.3 03/31/2019 0934   CL 97 03/31/2019 0934   CO2 28 03/31/2019 0934   GLUCOSE 78 03/31/2019 0934   BUN 20 03/31/2019  0934   BUN 21 11/20/2018 1413   CREATININE 0.61 03/31/2019 0934    CREATININE 0.59 10/01/2016 0939   CALCIUM 9.2 03/31/2019 0934   GFRNONAA >60 12/18/2018 0857   GFRAA >60 12/18/2018 0857   Lab Results  Component Value Date   HGBA1C 6.0 03/31/2019   HGBA1C 5.7 (H) 11/20/2018   HGBA1C 5.9 (H) 08/04/2018   Lab Results  Component Value Date   INSULIN 15.4 11/20/2018   INSULIN 33.5 (H) 08/04/2018   CBC    Component Value Date/Time   WBC 5.1 03/31/2019 0934   RBC 4.47 03/31/2019 0934   HGB 12.9 03/31/2019 0934   HCT 38.5 03/31/2019 0934   PLT 211.0 03/31/2019 0934   MCV 86.2 03/31/2019 0934   MCH 26.6 12/18/2018 0857   MCHC 33.5 03/31/2019 0934   RDW 14.2 03/31/2019 0934   LYMPHSABS 1.6 03/31/2019 0934   MONOABS 0.5 03/31/2019 0934   EOSABS 0.3 03/31/2019 0934   BASOSABS 0.0 03/31/2019 0934   Iron/TIBC/Ferritin/ %Sat No results found for: IRON, TIBC, FERRITIN, IRONPCTSAT Lipid Panel     Component Value Date/Time   CHOL 105 03/31/2019 0934   CHOL 99 (L) 09/30/2018 1108   TRIG 148.0 03/31/2019 0934   HDL 26.20 (L) 03/31/2019 0934   HDL 28 (L) 09/30/2018 1108   CHOLHDL 4 03/31/2019 0934   VLDL 29.6 03/31/2019 0934   LDLCALC 49 03/31/2019 0934   LDLCALC 45 09/30/2018 1108   LDLDIRECT 96.0 03/11/2018 0904   Hepatic Function Panel     Component Value Date/Time   PROT 7.0 03/31/2019 0934   PROT 7.3 11/20/2018 1413   ALBUMIN 4.0 03/31/2019 0934   ALBUMIN 4.0 11/20/2018 1413   AST 21 03/31/2019 0934   ALT 18 03/31/2019 0934   ALKPHOS 47 03/31/2019 0934   BILITOT 0.4 03/31/2019 0934   BILITOT 0.3 11/20/2018 1413   BILIDIR 0.1 03/31/2019 0934   IBILI 0.3 07/09/2016 0905      Component Value Date/Time   TSH 1.78 03/31/2019 0934   TSH 1.120 08/04/2018 0921   TSH 1.49 03/11/2018 0904     Ref. Range 11/20/2018 14:13  Vitamin D, 25-Hydroxy Latest Ref Range: 30.0 - 100.0 ng/mL 75.9    I, Doreene Nest, am acting as Location manager for [employer], FNP-C.  I have reviewed the above documentation for accuracy and completeness,  and I agree with the above.  - Eveny Anastas, FNP-C.

## 2019-04-02 ENCOUNTER — Encounter (INDEPENDENT_AMBULATORY_CARE_PROVIDER_SITE_OTHER): Payer: Self-pay | Admitting: Family Medicine

## 2019-04-06 ENCOUNTER — Ambulatory Visit
Admission: RE | Admit: 2019-04-06 | Discharge: 2019-04-06 | Disposition: A | Discharge status: Home / Self Care | Payer: Medicare Other | Source: Ambulatory Visit | Attending: Family Medicine | Admitting: Family Medicine

## 2019-04-06 DIAGNOSIS — Z85820 Personal history of malignant melanoma of skin: Secondary | ICD-10-CM

## 2019-04-08 ENCOUNTER — Encounter: Payer: Self-pay | Admitting: *Deleted

## 2019-04-09 ENCOUNTER — Encounter: Payer: Self-pay | Admitting: *Deleted

## 2019-04-10 DIAGNOSIS — M7660 Achilles tendinitis, unspecified leg: Secondary | ICD-10-CM | POA: Insufficient documentation

## 2019-04-13 ENCOUNTER — Other Ambulatory Visit: Payer: Self-pay

## 2019-04-13 ENCOUNTER — Encounter (INDEPENDENT_AMBULATORY_CARE_PROVIDER_SITE_OTHER): Payer: Self-pay | Admitting: Family Medicine

## 2019-04-13 ENCOUNTER — Ambulatory Visit (INDEPENDENT_AMBULATORY_CARE_PROVIDER_SITE_OTHER): Payer: Medicare Other | Admitting: Family Medicine

## 2019-04-13 VITALS — BP 112/74 | HR 89 | Temp 98.4°F | Ht 64.0 in | Wt 180.0 lb

## 2019-04-13 DIAGNOSIS — R7303 Prediabetes: Secondary | ICD-10-CM

## 2019-04-13 DIAGNOSIS — E669 Obesity, unspecified: Secondary | ICD-10-CM | POA: Diagnosis not present

## 2019-04-13 DIAGNOSIS — Z6831 Body mass index (BMI) 31.0-31.9, adult: Secondary | ICD-10-CM | POA: Diagnosis not present

## 2019-04-14 NOTE — Progress Notes (Signed)
Office: [phone]  /  Fax: [phone]   HPI:   Chief Complaint: OBESITY Natalie Hall is here to discuss her progress with her obesity treatment plan. She is on the Category 2 plan and is following her eating plan approximately 75-80% of the time. She states she is exercising 0 minutes 0 times per week. Natalie Hall states she is bored with food options. However, she does report eating all of the protein on the plan.  Her weight is 180 lb (81.6 kg) today and has had a weight loss of 2 pounds over a period of 16 weeks since her last in office visit. She has lost 33 lbs since starting treatment with [name].  Pre-Diabetes Natalie Hall has a diagnosis of prediabetes based on her elevated Hgb A1c and was informed this puts her at greater risk of developing diabetes. Her last A1c was increased to 6.0 from 5.7 in January 2020. She is not taking metformin currently and continues to work on diet and exercise to decrease risk of diabetes. She denies polyphagia.  ASSESSMENT AND PLAN:  Prediabetes  Class 1 obesity with serious comorbidity and body mass index (BMI) of 31.0 to 31.9 in adult, unspecified obesity type  PLAN:  Pre-Diabetes Natalie Hall will continue to work on weight loss, exercise, and decreasing simple carbohydrates in her diet to help decrease the risk of diabetes. We dicussed metformin including benefits and risks. She was informed that eating too many simple carbohydrates or too many calories at one sitting increases the likelihood of GI side effects. Natalie Hall will continue her meal plan and follow-up with [name] as directed to monitor her progress.   At least 15 minutes were spent on discussing the following behavioral intervention visit.  Obesity Natalie Hall is currently in the action stage of change. As such, her goal is to continue with weight loss efforts. She has agreed to follow the Category 2 plan. Handouts were given on Recipes and Smart Fruit. Natalie Hall will start PT 2-3 days per week. We discussed the  following Behavioral Modification Strategies today: work on meal planning and easy cooking plans, and planning for success.    Natalie Hall has agreed to follow-up with our clinic in 2-3 weeks. She was informed of the importance of frequent follow-up visits to maximize her success with intensive lifestyle modifications for her multiple health conditions.  ALLERGIES: Allergies  Allergen Reactions  . Codeine Other (See Comments), Shortness Of Breath and Rash    flushing flushing   . Levofloxacin In D5w Other (See Comments)    Leg pain  Leg pain   . Amoxicillin   . Amoxicillin-Pot Clavulanate Rash    Has patient had a PCN reaction causing immediate rash, facial/tongue/throat swelling, SOB or lightheadedness with hypotension: No Has patient had a PCN reaction causing severe rash involving mucus membranes or skin necrosis: No Has patient had a PCN reaction that required hospitalization: No Has patient had a PCN reaction occurring within the last 10 years: Yes If all of the above answers are "NO", then may proceed with Cephalosporin use.  Has patient had a PCN reaction causing immediate rash, facial/tongue/throat swelling, SOB or lightheadedness with hypotension: No Has patient had a PCN reaction causing severe rash involving mucus membranes or skin necrosis: No Has patient had a PCN reaction that required hospitalization: No Has patient had a PCN reaction occurring within the last 10 years: Yes If all of the above answers are "NO", then may proceed with Cephalosporin use.  . Escitalopram Oxalate Rash    MEDICATIONS: Current  Outpatient Medications on File Prior to Visit  Medication Sig Dispense Refill  . acetaminophen (TYLENOL) 500 MG tablet Take 500 mg by mouth every 6 (six) hours as needed (pain).     Marland Kitchen aspirin EC 81 MG tablet Take 81 mg by mouth daily.    . Calcium Carb-Cholecalciferol (CALCIUM PLUS VITAMIN D3) 600-800 MG-UNIT TABS Take 2 tablets by mouth daily.     . cetirizine (ZYRTEC)  10 MG tablet Take 10 mg by mouth daily as needed for allergies.    . Cholecalciferol (VITAMIN D3) 25 MCG (1000 UT) CAPS Take 1,000 Units by mouth daily.     . cyclobenzaprine (FLEXERIL) 10 MG tablet Take 1 tablet (10 mg total) by mouth 3 (three) times daily as needed for muscle spasms. 60 tablet 2  . diphenhydramine-acetaminophen (TYLENOL PM) 25-500 MG TABS tablet Take 2 tablets by mouth at bedtime as needed (for pain.).    Marland Kitchen ezetimibe (ZETIA) 10 MG tablet Take 1 tablet (10 mg total) by mouth daily. 90 tablet 3  . HYDROcodone-acetaminophen (NORCO) 5-325 MG tablet Take 1 tablet by mouth every 4 (four) hours as needed for moderate pain. 30 tablet 0  . metoprolol succinate (TOPROL-XL) 25 MG 24 hr tablet TAKE 1 TABLET BY MOUTH  TWICE A DAY 180 tablet 2  . nitroGLYCERIN (NITROSTAT) 0.4 MG SL tablet DISSOLVE 1 TABLET UNDER THE TONGUE EVERY 5 MINUTES FOR  3 DOSES AS NEEDED FOR CHEST PAIN (Patient taking differently: Place 0.4 mg under the tongue every 5 (five) minutes as needed for chest pain. ) 100 tablet 1  . omeprazole (PRILOSEC) 40 MG capsule Take 1 capsule (40 mg total) by mouth daily. 90 capsule 3  . ramipril (ALTACE) 10 MG capsule TAKE 1 CAPSULE BY MOUTH  DAILY 90 capsule 2  . rosuvastatin (CRESTOR) 40 MG tablet TAKE 1 TABLET BY MOUTH  DAILY 90 tablet 2  . temazepam (RESTORIL) 15 MG capsule Take 1 capsule (15 mg total) by mouth at bedtime as needed for sleep. 90 capsule 1  . triamcinolone (NASACORT) 55 MCG/ACT nasal inhaler Place 2 sprays into the nose daily. (Patient taking differently: Place 2 sprays into the nose daily as needed (allergies). ) 3 Inhaler 3   No current facility-administered medications on file prior to visit.     PAST MEDICAL HISTORY: Past Medical History:  Diagnosis Date  . Asthma    infrequent problem - no treatment since 2006  . Back pain   . Choroidal nevus of left eye    sees Dr. Gerarda Fraction at [facility].   . Constipation   . Coronary artery disease   .  Dysrhythmia    SVT - ablation by Dr. Curt Bears on 05/21/2016 and episodes since  . Fatty liver   . GERD (gastroesophageal reflux disease)   . History of hiatal hernia    "very small"  . Hx of colonic polyp   . Hyperlipidemia   . Hypertension   . Joint pain   . Melanoma of eye, left ([place])    sees Dr. Cordelia Pen  . Myocardial infarction ([place])   . Osteoarthritis   . Shortness of breath   . Supraventricular tachycardia ([place])     PAST SURGICAL HISTORY: Past Surgical History:  Procedure Laterality Date  . ABDOMINAL HYSTERECTOMY    . ACHILLES TENDON REPAIR  10-07-11   torn on right, per Dr. Noemi Chapel   . ANKLE SURGERY     torn tendon left ankle  . BREAST BIOPSY    .  CARDIAC CATHETERIZATION N/A 03/14/2016   Procedure: Left Heart Cath and Coronary Angiography;  Surgeon: Peter M Martinique, MD;  Location: [facility];  Service: Cardiovascular;  Laterality: N/A;  . CARDIAC CATHETERIZATION N/A 03/14/2016   Procedure: Coronary Stent Intervention;  Surgeon: Peter M Martinique, MD;  Location: [facility];  Service: Cardiovascular;  Laterality: N/A;  . CARPAL TUNNEL RELEASE    . CATARACT EXTRACTION W/ INTRAOCULAR LENS IMPLANT Bilateral   . CHOLECYSTECTOMY    . COLONOSCOPY  06-23-14   per Dr. Ardis Hughs, adenomatous polyps, repeat in 5 yrs    . ELECTROPHYSIOLOGIC STUDY N/A 05/21/2016   Procedure: SVT Ablation;  Surgeon: [name], MD;  Location: [facility];  Service: Cardiovascular;  Laterality: N/A;  . EYE SURGERY Left 08/2016  . IR KYPHO THORACIC WITH BONE BIOPSY  12/18/2018  . IR RADIOLOGIST EVAL & MGMT  12/09/2018  . KNEE CLOSED REDUCTION Left 02/03/2014   Procedure: CLOSED MANIPULATION LEFT KNEE;  Surgeon: Gearlean Alf, MD;  Location: [facility];  Service: Orthopedics;  Laterality: Left;  . POLYPECTOMY    . REVERSE SHOULDER ARTHROPLASTY     Left  . REVERSE SHOULDER ARTHROPLASTY Left 06/28/2017   Procedure: LEFT SHOULDER REVERSE SHOULDER ARTHROPLASTY;  Surgeon: Netta Cedars, MD;   Location: [facility];  Service: Orthopedics;  Laterality: Left;  . TOTAL KNEE ARTHROPLASTY Left 12/21/2013   Procedure: LEFT TOTAL KNEE ARTHROPLASTY;  Surgeon: Gearlean Alf, MD;  Location: [facility];  Service: Orthopedics;  Laterality: Left;  Marland Kitchen VARICOSE VEIN SURGERY      SOCIAL HISTORY: Social History   Tobacco Use  . Smoking status: Never Smoker  . Smokeless tobacco: Never Used  Substance Use Topics  . Alcohol use: No    Alcohol/week: 0.0 standard drinks  . Drug use: No    FAMILY HISTORY: Family History  Problem Relation Age of Onset  . Colon polyps Mother   . Diabetes Mother   . High blood pressure Mother   . High Cholesterol Mother   . Thyroid disease Mother   . Anxiety disorder Mother   . Colon cancer Maternal Aunt   . Liver cancer Maternal Aunt   . Breast cancer Other   . Coronary artery disease Other   . Colon cancer Other   . Diabetes Other   . Hypertension Other   . Kidney disease Other   . Lung cancer Other   . Lung cancer Brother        spleen, bone  . Diabetes Father   . High blood pressure Father   . High Cholesterol Father   . Heart disease Father   . Depression Father   . Obesity Father   . Esophageal cancer Neg Hx   . Stomach cancer Neg Hx   . Rectal cancer Neg Hx    ROS: Review of Systems  Endo/Heme/Allergies:       Negative for polyphagia.   PHYSICAL EXAM: Blood pressure 112/74, pulse 89, temperature 98.4 F (36.9 C), temperature source Oral, height 5\' 4"  (4.128 m), weight 180 lb (81.6 kg), SpO2 95 %. Body mass index is 30.9 kg/m. Physical Exam Vitals signs reviewed.  Constitutional:      Appearance: Normal appearance. She is obese.  Cardiovascular:     Rate and Rhythm: Normal rate.     Pulses: Normal pulses.  Pulmonary:     Effort: Pulmonary effort is normal.     Breath sounds: Normal breath sounds.  Musculoskeletal: Normal range of motion.  Skin:    General: Skin is warm and dry.  Neurological:     Mental Status: She is alert and  oriented to person, place, and time.  Psychiatric:        Behavior: Behavior normal.   RECENT LABS AND TESTS: BMET    Component Value Date/Time   NA 132 (L) 03/31/2019 0934   NA 135 11/20/2018 1413   K 4.3 03/31/2019 0934   CL 97 03/31/2019 0934   CO2 28 03/31/2019 0934   GLUCOSE 78 03/31/2019 0934   BUN 20 03/31/2019 0934   BUN 21 11/20/2018 1413   CREATININE 0.61 03/31/2019 0934   CREATININE 0.59 10/01/2016 0939   CALCIUM 9.2 03/31/2019 0934   GFRNONAA >60 12/18/2018 0857   GFRAA >60 12/18/2018 0857   Lab Results  Component Value Date   HGBA1C 6.0 03/31/2019   HGBA1C 5.7 (H) 11/20/2018   HGBA1C 5.9 (H) 08/04/2018   Lab Results  Component Value Date   INSULIN 15.4 11/20/2018   INSULIN 33.5 (H) 08/04/2018   CBC    Component Value Date/Time   WBC 5.1 03/31/2019 0934   RBC 4.47 03/31/2019 0934   HGB 12.9 03/31/2019 0934   HCT 38.5 03/31/2019 0934   PLT 211.0 03/31/2019 0934   MCV 86.2 03/31/2019 0934   MCH 26.6 12/18/2018 0857   MCHC 33.5 03/31/2019 0934   RDW 14.2 03/31/2019 0934   LYMPHSABS 1.6 03/31/2019 0934   MONOABS 0.5 03/31/2019 0934   EOSABS 0.3 03/31/2019 0934   BASOSABS 0.0 03/31/2019 0934   Iron/TIBC/Ferritin/ %Sat No results found for: IRON, TIBC, FERRITIN, IRONPCTSAT Lipid Panel     Component Value Date/Time   CHOL 105 03/31/2019 0934   CHOL 99 (L) 09/30/2018 1108   TRIG 148.0 03/31/2019 0934   HDL 26.20 (L) 03/31/2019 0934   HDL 28 (L) 09/30/2018 1108   CHOLHDL 4 03/31/2019 0934   VLDL 29.6 03/31/2019 0934   LDLCALC 49 03/31/2019 0934   LDLCALC 45 09/30/2018 1108   LDLDIRECT 96.0 03/11/2018 0904   Hepatic Function Panel     Component Value Date/Time   PROT 7.0 03/31/2019 0934   PROT 7.3 11/20/2018 1413   ALBUMIN 4.0 03/31/2019 0934   ALBUMIN 4.0 11/20/2018 1413   AST 21 03/31/2019 0934   ALT 18 03/31/2019 0934   ALKPHOS 47 03/31/2019 0934   BILITOT 0.4 03/31/2019 0934   BILITOT 0.3 11/20/2018 1413   BILIDIR 0.1 03/31/2019 0934    IBILI 0.3 07/09/2016 0905      Component Value Date/Time   TSH 1.78 03/31/2019 0934   TSH 1.120 08/04/2018 0921   TSH 1.49 03/11/2018 0904   Results for MALENA, TIMPONE (MRN [id number]) as of 04/14/2019 09:33  Ref. Range 11/20/2018 14:13  Vitamin D, 25-Hydroxy Latest Ref Range: 30.0 - 100.0 ng/mL 75.9    OBESITY BEHAVIORAL INTERVENTION VISIT  Today's visit was #13  Starting weight: 213 lbs Starting date: 08/04/2018 Today's weight: 180 lbs Today's date: 04/13/2019 Total lbs lost to date: 52  ASK: We discussed the diagnosis of obesity with Natalie Hall today and Natalie Hall agreed to give [name] permission to discuss obesity behavioral modification therapy today.  ASSESS: Shacoria has the diagnosis of obesity and her BMI today is 31.0. Everlie is in the action stage of change.   ADVISE: Jammie was educated on the multiple health risks of obesity as well as the benefit of weight loss to improve her health. She was advised of the need for long term treatment  and the importance of lifestyle modifications to improve her current health and to decrease her risk of future health problems.  AGREE: Multiple dietary modification options and treatment options were discussed and  Natalie Hall agreed to follow the recommendations documented in the above note.  ARRANGE: Natalie Hall was educated on the importance of frequent visits to treat obesity as outlined per CMS and USPSTF guidelines and agreed to schedule her next follow up appointment today.  IMichaelene Song, am acting as Location manager for [employer], FNP  I have reviewed the above documentation for accuracy and completeness, and I agree with the above.  - Magon Croson, FNP-C.

## 2019-04-16 ENCOUNTER — Encounter (INDEPENDENT_AMBULATORY_CARE_PROVIDER_SITE_OTHER): Payer: Self-pay | Admitting: Family Medicine

## 2019-04-30 ENCOUNTER — Other Ambulatory Visit: Payer: Self-pay

## 2019-04-30 ENCOUNTER — Ambulatory Visit
Admission: RE | Admit: 2019-04-30 | Discharge: 2019-04-30 | Disposition: A | Discharge status: Home / Self Care | Payer: Medicare Other | Source: Ambulatory Visit | Attending: Interventional Radiology | Admitting: Interventional Radiology

## 2019-04-30 ENCOUNTER — Encounter: Payer: Self-pay | Admitting: *Deleted

## 2019-04-30 DIAGNOSIS — Z9889 Other specified postprocedural states: Secondary | ICD-10-CM

## 2019-04-30 HISTORY — PX: IR RADIOLOGIST EVAL & MGMT: IMG5224

## 2019-04-30 NOTE — Progress Notes (Signed)
Patient ID: Natalie Hall, female   DOB: 03-Apr-1951, 68 y.o.   MRN: [id number]         Chief Complaint: Post T11 kyphoplasty  Referring Physician(s): Melynda Ripple  History of Present Illness: Natalie Hall is a 68 y.o. female with past medical history significant for asthma, GERD, hypertension, hyperlipidemia, CAD (myocardial infarction) and melanoma of the left eye who was found to have a mild T11 compression fracture on thoracic spine MRI performed 11/16/2018 that she suffered after falling down some steps.  The patient was initially seen in consultation by my interventional radiology partner, Dr. Anselm Pancoast, on 12/09/2018, and underwent a technically successful fluoroscopic guided vertebral body kyphoplasty on 12/18/2018.  Patient is evaluated today via telemedicine consultation for postprocedural evaluation and management.  Patient states she has recovered fully from the kyphoplasty and has no residual fracture related back pain.  Patient reports she has some mild intermittent back pain which she attributed to degenerative change but states her fracture related pain is completely resolved.  Patient underwent a DEXA scan in March of this year which demonstrated no change in her baseline osteopenia compared to approximately 2 years ago and as such she has not been started on formal osteoporotic medication at this time.  Past Medical History:  Diagnosis Date   Asthma    infrequent problem - no treatment since 2006   Back pain    Choroidal nevus of left eye    sees Dr. Gerarda Fraction at [facility]    Constipation    Coronary artery disease    Dysrhythmia    SVT - ablation by Dr. Curt Bears on 05/21/2016 and episodes since   Fatty liver    GERD (gastroesophageal reflux disease)    History of hiatal hernia    "very small"   Hx of colonic polyp    Hyperlipidemia    Hypertension    Joint pain    Melanoma of eye, left ([place])    sees Dr. Cordelia Pen   Myocardial  infarction [facility])    Osteoarthritis    Shortness of breath    Supraventricular tachycardia ([place])     Past Surgical History:  Procedure Laterality Date   ABDOMINAL HYSTERECTOMY     ACHILLES TENDON REPAIR  10-07-11   torn on right, per Dr. Marge Duncans SURGERY     torn tendon left ankle   BREAST BIOPSY     CARDIAC CATHETERIZATION N/A 03/14/2016   Procedure: Left Heart Cath and Coronary Angiography;  Surgeon: Peter M Martinique, MD;  Location: [place] [facility];  Service: Cardiovascular;  Laterality: N/A;   CARDIAC CATHETERIZATION N/A 03/14/2016   Procedure: Coronary Stent Intervention;  Surgeon: Peter M Martinique, MD;  Location: [place] [facility];  Service: Cardiovascular;  Laterality: N/A;   CARPAL TUNNEL RELEASE     CATARACT EXTRACTION W/ INTRAOCULAR LENS IMPLANT Bilateral    CHOLECYSTECTOMY     COLONOSCOPY  06-23-14   per Dr. Ardis Hughs, adenomatous polyps, repeat in 5 yrs     ELECTROPHYSIOLOGIC STUDY N/A 05/21/2016   Procedure: SVT Ablation;  Surgeon: [name], MD;  Location: [place] [facility];  Service: Cardiovascular;  Laterality: N/A;   EYE SURGERY Left 08/2016   IR KYPHO THORACIC WITH BONE BIOPSY  12/18/2018   IR RADIOLOGIST EVAL & MGMT  12/09/2018   KNEE CLOSED REDUCTION Left 02/03/2014   Procedure: CLOSED MANIPULATION LEFT KNEE;  Surgeon: Gearlean Alf, MD;  Location: [facility];  Service:  Orthopedics;  Laterality: Left;   POLYPECTOMY     REVERSE SHOULDER ARTHROPLASTY     Left   REVERSE SHOULDER ARTHROPLASTY Left 06/28/2017   Procedure: LEFT SHOULDER REVERSE SHOULDER ARTHROPLASTY;  Surgeon: Netta Cedars, MD;  Location: [place];  Service: Orthopedics;  Laterality: Left;   TOTAL KNEE ARTHROPLASTY Left 12/21/2013   Procedure: LEFT TOTAL KNEE ARTHROPLASTY;  Surgeon: Gearlean Alf, MD;  Location: [facility];  Service: Orthopedics;  Laterality: Left;   VARICOSE VEIN SURGERY      Allergies: Codeine, Levofloxacin in d5w, Amoxicillin, Amoxicillin-pot clavulanate,  and Escitalopram oxalate  Medications: Prior to Admission medications   Medication Sig Start Date End Date Taking? Authorizing Provider  acetaminophen (TYLENOL) 500 MG tablet Take 500 mg by mouth every 6 (six) hours as needed (pain).     [provider]  aspirin EC 81 MG tablet Take 81 mg by mouth daily.    [provider]  Calcium Carb-Cholecalciferol (CALCIUM PLUS VITAMIN D3) 600-800 MG-UNIT TABS Take 2 tablets by mouth daily.     [provider]  cetirizine (ZYRTEC) 10 MG tablet Take 10 mg by mouth daily as needed for allergies.    [provider]  Cholecalciferol (VITAMIN D3) 25 MCG (1000 UT) CAPS Take 1,000 Units by mouth daily.     [provider]  cyclobenzaprine (FLEXERIL) 10 MG tablet Take 1 tablet (10 mg total) by mouth 3 (three) times daily as needed for muscle spasms. 12/15/18   Laurey Morale, MD  diphenhydramine-acetaminophen (TYLENOL PM) 25-500 MG TABS tablet Take 2 tablets by mouth at bedtime as needed (for pain.).    [provider]  ezetimibe (ZETIA) 10 MG tablet Take 1 tablet (10 mg total) by mouth daily. 10/03/18   Hilty, Nadean Corwin, MD  HYDROcodone-acetaminophen (NORCO) 5-325 MG tablet Take 1 tablet by mouth every 4 (four) hours as needed for moderate pain. 11/12/18   Laurey Morale, MD  metoprolol succinate (TOPROL-XL) 25 MG 24 hr tablet TAKE 1 TABLET BY MOUTH  TWICE A DAY 07/30/18   Hilty, Nadean Corwin, MD  nitroGLYCERIN (NITROSTAT) 0.4 MG SL tablet DISSOLVE 1 TABLET UNDER THE TONGUE EVERY 5 MINUTES FOR  3 DOSES AS NEEDED FOR CHEST PAIN Patient taking differently: Place 0.4 mg under the tongue every 5 (five) minutes as needed for chest pain.  11/16/16   Hilty, Nadean Corwin, MD  omeprazole (PRILOSEC) 40 MG capsule Take 1 capsule (40 mg total) by mouth daily. 03/31/19   Laurey Morale, MD  ramipril (ALTACE) 10 MG capsule TAKE 1 CAPSULE BY MOUTH  DAILY 07/30/18   Hilty, Nadean Corwin, MD  rosuvastatin (CRESTOR) 40 MG tablet TAKE 1 TABLET BY  MOUTH  DAILY 07/30/18   Hilty, Nadean Corwin, MD  temazepam (RESTORIL) 15 MG capsule Take 1 capsule (15 mg total) by mouth at bedtime as needed for sleep. 03/31/19   Laurey Morale, MD  triamcinolone (NASACORT) 55 MCG/ACT nasal inhaler Place 2 sprays into the nose daily. Patient taking differently: Place 2 sprays into the nose daily as needed (allergies).  01/29/13   Laurey Morale, MD     Family History  Problem Relation Age of Onset   Colon polyps Mother    Diabetes Mother    High blood pressure Mother    High Cholesterol Mother    Thyroid disease Mother    Anxiety disorder Mother    Colon cancer Maternal Aunt    Liver cancer Maternal Aunt    Breast cancer Other  Coronary artery disease Other    Colon cancer Other    Diabetes Other    Hypertension Other    Kidney disease Other    Lung cancer Other    Lung cancer Brother        spleen, bone   Diabetes Father    High blood pressure Father    High Cholesterol Father    Heart disease Father    Depression Father    Obesity Father    Esophageal cancer Neg Hx    Stomach cancer Neg Hx    Rectal cancer Neg Hx     Social History   Socioeconomic History   Marital status: Married    Spouse name: Runner, broadcasting/film/video   Number of children: Not on file   Years of education: Not on file   Highest education level: Not on file  Occupational History   Occupation: retired  Scientist, product/process development strain: Not on file   Food insecurity    Worry: Not on file    Inability: Not on Lexicographer needs    Medical: Not on file    Non-medical: Not on file  Tobacco Use   Smoking status: Never Smoker   Smokeless tobacco: Never Used  Substance and Sexual Activity   Alcohol use: No    Alcohol/week: 0.0 standard drinks   Drug use: No   Sexual activity: Not on file  Lifestyle   Physical activity    Days per week: Not on file    Minutes per session: Not on file   Stress: Not on file    Relationships   Social connections    Talks on phone: Not on file    Gets together: Not on file    Attends religious service: Not on file    Active member of club or organization: Not on file    Attends meetings of clubs or organizations: Not on file    Relationship status: Not on file  Other Topics Concern   Not on file  Social History Narrative   Not on file    ECOG Status: 1 - Symptomatic but completely ambulatory  Review of Systems  Review of Systems: A 12 point ROS discussed and pertinent positives are indicated in the HPI above.  All other systems are negative.  Physical Exam   Vital Signs: LMP  (LMP Unknown)   Imaging:  Thoracic spine MRI - 11/16/2018; intraprocedural images during T11 kyphoplasty - 12/18/2018   US Abdomen Complete  Result Date: 04/06/2019 CLINICAL DATA:  Melanoma screening.  History of malignant melanoma. EXAM: ABDOMEN ULTRASOUND COMPLETE COMPARISON:  None. FINDINGS: Gallbladder: The patient is status post prior cholecystectomy. Common bile duct: Diameter: 0.7 cm Liver: Diffuse increased echogenicity with slightly heterogeneous liver. Appearance typically secondary to fatty infiltration. Fibrosis secondary consideration. No secondary findings of cirrhosis noted. No focal hepatic lesion or intrahepatic biliary duct dilatation. Portal vein is patent on color Doppler imaging with normal direction of blood flow towards the liver. IVC: Obscured by overlying bowel gas. Pancreas: Not well evaluated secondary to overlying bowel gas. Spleen: Size and appearance within normal limits. Right Kidney: Length: 10.8. Echogenicity within normal limits. No mass or hydronephrosis visualized. Left Kidney: Length: 12.2. Echogenicity within normal limits. No mass or hydronephrosis visualized. Abdominal aorta: No aneurysm visualized. Other findings: None. IMPRESSION: 1. Diffuse increased echogenicity with slightly heterogeneous liver. Appearance typically secondary to fatty  infiltration. Fibrosis secondary consideration. No secondary findings of cirrhosis noted. No focal hepatic lesion or  intrahepatic biliary duct dilatation, however evaluation is limited by underlying hepatic steatosis. 2. Status post cholecystectomy. Electronically Signed   By: Constance Holster [name]   On: 04/06/2019 14:09    Labs:  CBC: Recent Labs    12/18/18 0857 03/31/19 0934  WBC 4.7 5.1  HGB 12.3 12.9  HCT 38.7 38.5  PLT 184 211.0    COAGS: No results for input(s): INR, APTT in the last 8760 hours.  BMP: Recent Labs    08/04/18 0921 11/20/18 1413 12/18/18 0857 03/31/19 0934  NA 138 135 132* 132*  K 4.8 4.4 4.0 4.3  CL 100 98 101 97  CO2 23 18* 23 28  GLUCOSE 90 78 87 78  BUN 17 21 18 20   CALCIUM 9.1 9.3 9.0 9.2  CREATININE 0.68 0.65 0.60 0.61  GFRNONAA 90 92 >60  --   GFRAA 104 105 >60  --     LIVER FUNCTION TESTS: Recent Labs    08/04/18 0921 11/20/18 1413 03/31/19 0934  BILITOT 0.3 0.3 0.4  AST 22 21 21   ALT 25 15 18   ALKPHOS 56 71 47  PROT 7.1 7.3 7.0  ALBUMIN 4.3 4.0 4.0    TUMOR MARKERS: No results for input(s): AFPTM, CEA, CA199, CHROMGRNA in the last 8760 hours.  Assessment and Plan:  Natalie Hall is a 68 y.o. female with past medical history significant for asthma, GERD, hypertension, hyperlipidemia, CAD (myocardial infarction) and melanoma of the left eye who was found to have a mild T11 compression fracture on thoracic spine MRI performed 11/16/2018 that she suffered after falling down some steps, for which she underwent a technically successful fluoroscopic guided vertebral body kyphoplasty on 12/18/2018.  Preprocedural thoracic spine MRI performed 11/16/2018 as well as intraprocedural images during T11 kyphoplasty performed 11/17/2018 were personally reviewed and discussed with the patient.  As above, patient has been evaluated for osteopenia/osteoporosis, however is currently not been deemed a candidate for osteoporotic medication.  I feel  that this is reasonable based on the morphologic appearance of her thoracic and lumbar spine on the MRI as well as the significant mechanism of injury she endured when she experienced during this isolated compression fracture  I explained to the patient that if she were to suffer another traumatic injury and experience symptoms similar to her T11 compression fracture, she should not hesitate to reach out to the interventional radiology clinic to help facilitate evaluation and management.  The patient demonstrated excellent understanding of above conversation and is very pleased with the outcome of the procedure.  The patient may otherwise follow-up at the interventional radiology clinic on a PRN basis.  A copy of this report was sent to the requesting provider on this date.  Electronically Signed: Sandi Mariscal 04/30/2019, 2:04 PM   I spent a total of 10 Minutes in remote  clinical consultation, greater than 50% of which was counseling/coordinating care for post T11 kyphoplasty.    Visit type: Audio only (telephone). Audio (no video) only due to patient's lack of internet/smartphone capability. Alternative for in-person consultation at [facility], [address], [place], [place]. This visit type was conducted due to national recommendations for restrictions regarding the COVID-19 Pandemic (e.g. social distancing).  This format is felt to be most appropriate for this patient at this time.  All issues noted in this document were discussed and addressed.

## 2019-05-04 ENCOUNTER — Encounter (INDEPENDENT_AMBULATORY_CARE_PROVIDER_SITE_OTHER): Payer: Self-pay | Admitting: Bariatrics

## 2019-05-04 ENCOUNTER — Other Ambulatory Visit: Payer: Self-pay

## 2019-05-04 ENCOUNTER — Ambulatory Visit (INDEPENDENT_AMBULATORY_CARE_PROVIDER_SITE_OTHER): Payer: Medicare Other | Admitting: Bariatrics

## 2019-05-04 VITALS — BP 127/72 | HR 61 | Temp 97.6°F | Ht 64.0 in | Wt 180.0 lb

## 2019-05-04 DIAGNOSIS — I10 Essential (primary) hypertension: Secondary | ICD-10-CM

## 2019-05-04 DIAGNOSIS — Z6831 Body mass index (BMI) 31.0-31.9, adult: Secondary | ICD-10-CM | POA: Diagnosis not present

## 2019-05-04 DIAGNOSIS — R7303 Prediabetes: Secondary | ICD-10-CM

## 2019-05-04 DIAGNOSIS — E669 Obesity, unspecified: Secondary | ICD-10-CM | POA: Diagnosis not present

## 2019-05-04 NOTE — Progress Notes (Signed)
Office: [phone]  /  Fax: [phone]   HPI:   Chief Complaint: OBESITY Natalie Hall is here to discuss her progress with her obesity treatment plan. She is on the  follow the Category 2 plan and is following her eating plan approximately 0 % of the time. She states she is exercising 0 minutes 0 times per week. Natalie Hall states she has been stuck at the same weight for 3 weeks. She is doing well overall. She states that she was eating when she should not.  Her weight is 180 lb (81.6 kg) today and has not lost weight since her last visit. She has lost 33 lbs since starting treatment with [name].  Pre-Diabetes Natalie Hall has a diagnosis of prediabetes based on her elevated HgA1c of 6.0 and Insulin level of 13.6 and was informed this puts her at greater risk of developing diabetes. She is taking metformin currently and continues to work on diet and exercise to decrease risk of diabetes. She denies nausea or hypoglycemia.  Hypertension Natalie Hall is a 68 y.o. female with hypertension.  Natalie Hall denies chest pain or shortness of breath on exertion. She is working weight loss to help control her blood pressure with the goal of decreasing her risk of heart attack and stroke. Natalie Hall blood pressure is currently controlled.    ASSESSMENT AND PLAN:  Prediabetes  Essential hypertension  Class 1 obesity with serious comorbidity and body mass index (BMI) of 31.0 to 31.9 in adult, unspecified obesity type  PLAN: Pre-Diabetes Natalie Hall will continue to work on weight loss, exercise, and decreasing simple carbohydrates in her diet to help decrease the risk of diabetes. We dicussed metformin including benefits and risks. She was informed that eating too many simple carbohydrates or too many calories at one sitting increases the likelihood of GI side effects. She agrees to decrease carbohydrates and increase protein. She will increase her activity.  Natalie Hall agreed to follow up with [name] as directed to monitor  her progress.  Hypertension We discussed sodium restriction, working on healthy weight loss, and a regular exercise program as the means to achieve improved blood pressure control. Natalie Hall agreed with this plan and agreed to follow up as directed. We will continue to monitor her blood pressure as well as her progress with the above lifestyle modifications. She will continue her medications as prescribed and will watch for signs of hypotension as she continues her lifestyle modifications.  Obesity Natalie Hall is currently in the action stage of change. As such, her goal is to continue with weight loss efforts She has agreed to follow the Category 2 plan Natalie Hall has been instructed to work up to a goal of 150 minutes of combined cardio and strengthening exercise per week for weight loss and overall health benefits. PT on left ankle (non-weight bearing). Use 3 lb weights and bands.  We discussed the following Behavioral Modification Stratagies today: increasing water intake, no skipping meals, keeping healthy foods in the home, increasing lean protein intake, decreasing simple carbohydrates , increasing vegetables, decrease eating out and work on meal planning and easy cooking plans   Natalie Hall has agreed to follow up with our clinic in 2 weeks. She was informed of the importance of frequent follow up visits to maximize her success with intensive lifestyle modifications for her multiple health conditions.  ALLERGIES: Allergies  Allergen Reactions  . Codeine Other (See Comments), Shortness Of Breath and Rash    flushing flushing   . Levofloxacin In D5w Other (See Comments)  Leg pain  Leg pain   . Amoxicillin   . Amoxicillin-Pot Clavulanate Rash    Has patient had a PCN reaction causing immediate rash, facial/tongue/throat swelling, SOB or lightheadedness with hypotension: No Has patient had a PCN reaction causing severe rash involving mucus membranes or skin necrosis: No Has patient had a PCN  reaction that required hospitalization: No Has patient had a PCN reaction occurring within the last 10 years: Yes If all of the above answers are "NO", then may proceed with Cephalosporin use.  Has patient had a PCN reaction causing immediate rash, facial/tongue/throat swelling, SOB or lightheadedness with hypotension: No Has patient had a PCN reaction causing severe rash involving mucus membranes or skin necrosis: No Has patient had a PCN reaction that required hospitalization: No Has patient had a PCN reaction occurring within the last 10 years: Yes If all of the above answers are "NO", then may proceed with Cephalosporin use.  . Escitalopram Oxalate Rash    MEDICATIONS: Current Outpatient Medications on File Prior to Visit  Medication Sig Dispense Refill  . acetaminophen (TYLENOL) 500 MG tablet Take 500 mg by mouth every 6 (six) hours as needed (pain).     Marland Kitchen aspirin EC 81 MG tablet Take 81 mg by mouth daily.    . Calcium Carb-Cholecalciferol (CALCIUM PLUS VITAMIN D3) 600-800 MG-UNIT TABS Take 2 tablets by mouth daily.     . cetirizine (ZYRTEC) 10 MG tablet Take 10 mg by mouth daily as needed for allergies.    . Cholecalciferol (VITAMIN D3) 25 MCG (1000 UT) CAPS Take 1,000 Units by mouth daily.     . cyclobenzaprine (FLEXERIL) 10 MG tablet Take 1 tablet (10 mg total) by mouth 3 (three) times daily as needed for muscle spasms. 60 tablet 2  . diphenhydramine-acetaminophen (TYLENOL PM) 25-500 MG TABS tablet Take 2 tablets by mouth at bedtime as needed (for pain.).    Marland Kitchen ezetimibe (ZETIA) 10 MG tablet Take 1 tablet (10 mg total) by mouth daily. 90 tablet 3  . HYDROcodone-acetaminophen (NORCO) 5-325 MG tablet Take 1 tablet by mouth every 4 (four) hours as needed for moderate pain. 30 tablet 0  . metoprolol succinate (TOPROL-XL) 25 MG 24 hr tablet TAKE 1 TABLET BY MOUTH  TWICE A DAY 180 tablet 2  . nitroGLYCERIN (NITROSTAT) 0.4 MG SL tablet DISSOLVE 1 TABLET UNDER THE TONGUE EVERY 5 MINUTES FOR   3 DOSES AS NEEDED FOR CHEST PAIN (Patient taking differently: Place 0.4 mg under the tongue every 5 (five) minutes as needed for chest pain. ) 100 tablet 1  . omeprazole (PRILOSEC) 40 MG capsule Take 1 capsule (40 mg total) by mouth daily. 90 capsule 3  . ramipril (ALTACE) 10 MG capsule TAKE 1 CAPSULE BY MOUTH  DAILY 90 capsule 2  . rosuvastatin (CRESTOR) 40 MG tablet TAKE 1 TABLET BY MOUTH  DAILY 90 tablet 2  . temazepam (RESTORIL) 15 MG capsule Take 1 capsule (15 mg total) by mouth at bedtime as needed for sleep. 90 capsule 1  . triamcinolone (NASACORT) 55 MCG/ACT nasal inhaler Place 2 sprays into the nose daily. (Patient taking differently: Place 2 sprays into the nose daily as needed (allergies). ) 3 Inhaler 3   No current facility-administered medications on file prior to visit.     PAST MEDICAL HISTORY: Past Medical History:  Diagnosis Date  . Asthma    infrequent problem - no treatment since 2006  . Back pain   . Choroidal nevus of left eye  sees Dr. Gerarda Fraction at [place].   . Constipation   . Coronary artery disease   . Dysrhythmia    SVT - ablation by Dr. Curt Bears on 05/21/2016 and episodes since  . Fatty liver   . GERD (gastroesophageal reflux disease)   . History of hiatal hernia    "very small"  . Hx of colonic polyp   . Hyperlipidemia   . Hypertension   . Joint pain   . Melanoma of eye, left ([place])    sees Dr. Cordelia Pen  . Myocardial infarction ([place])   . Osteoarthritis   . Shortness of breath   . Supraventricular tachycardia ([place])     PAST SURGICAL HISTORY: Past Surgical History:  Procedure Laterality Date  . ABDOMINAL HYSTERECTOMY    . ACHILLES TENDON REPAIR  10-07-11   torn on right, per Dr. Noemi Chapel   . ANKLE SURGERY     torn tendon left ankle  . BREAST BIOPSY    . CARDIAC CATHETERIZATION N/A 03/14/2016   Procedure: Left Heart Cath and Coronary Angiography;  Surgeon: Peter M Martinique, MD;  Location: [facility];  Service: Cardiovascular;   Laterality: N/A;  . CARDIAC CATHETERIZATION N/A 03/14/2016   Procedure: Coronary Stent Intervention;  Surgeon: Peter M Martinique, MD;  Location: [facility];  Service: Cardiovascular;  Laterality: N/A;  . CARPAL TUNNEL RELEASE    . CATARACT EXTRACTION W/ INTRAOCULAR LENS IMPLANT Bilateral   . CHOLECYSTECTOMY    . COLONOSCOPY  06-23-14   per Dr. Ardis Hughs, adenomatous polyps, repeat in 5 yrs    . ELECTROPHYSIOLOGIC STUDY N/A 05/21/2016   Procedure: SVT Ablation;  Surgeon: [name], MD;  Location: [facility];  Service: Cardiovascular;  Laterality: N/A;  . EYE SURGERY Left 08/2016  . IR KYPHO THORACIC WITH BONE BIOPSY  12/18/2018  . IR RADIOLOGIST EVAL & MGMT  12/09/2018  . IR RADIOLOGIST EVAL & MGMT  04/30/2019  . KNEE CLOSED REDUCTION Left 02/03/2014   Procedure: CLOSED MANIPULATION LEFT KNEE;  Surgeon: Gearlean Alf, MD;  Location: [facility];  Service: Orthopedics;  Laterality: Left;  . POLYPECTOMY    . REVERSE SHOULDER ARTHROPLASTY     Left  . REVERSE SHOULDER ARTHROPLASTY Left 06/28/2017   Procedure: LEFT SHOULDER REVERSE SHOULDER ARTHROPLASTY;  Surgeon: Netta Cedars, MD;  Location: [facility];  Service: Orthopedics;  Laterality: Left;  . TOTAL KNEE ARTHROPLASTY Left 12/21/2013   Procedure: LEFT TOTAL KNEE ARTHROPLASTY;  Surgeon: Gearlean Alf, MD;  Location: [facility];  Service: Orthopedics;  Laterality: Left;  Marland Kitchen VARICOSE VEIN SURGERY      SOCIAL HISTORY: Social History   Tobacco Use  . Smoking status: Never Smoker  . Smokeless tobacco: Never Used  Substance Use Topics  . Alcohol use: No    Alcohol/week: 0.0 standard drinks  . Drug use: No    FAMILY HISTORY: Family History  Problem Relation Age of Onset  . Colon polyps Mother   . Diabetes Mother   . High blood pressure Mother   . High Cholesterol Mother   . Thyroid disease Mother   . Anxiety disorder Mother   . Colon cancer Maternal Aunt   . Liver cancer Maternal Aunt   . Breast cancer Other   . Coronary artery  disease Other   . Colon cancer Other   . Diabetes Other   . Hypertension Other   . Kidney disease Other   . Lung cancer Other   . Lung cancer Brother  spleen, bone  . Diabetes Father   . High blood pressure Father   . High Cholesterol Father   . Heart disease Father   . Depression Father   . Obesity Father   . Esophageal cancer Neg Hx   . Stomach cancer Neg Hx   . Rectal cancer Neg Hx     ROS: Review of Systems  Constitutional: Negative for weight loss.  Respiratory: Negative for shortness of breath.   Cardiovascular: Negative for chest pain.  Endo/Heme/Allergies:       Negative for hypoglycemia    PHYSICAL EXAM: Blood pressure 127/72, pulse 61, temperature 97.6 F (36.4 C), temperature source Oral, height 5\' 4"  (1.626 m), weight 180 lb (81.6 kg), SpO2 97 %. Body mass index is 30.9 kg/m. Physical Exam Vitals signs reviewed.  Constitutional:      Appearance: Normal appearance. She is obese.  HENT:     Head: Normocephalic.     Nose: Nose normal.  Cardiovascular:     Rate and Rhythm: Normal rate.  Pulmonary:     Effort: Pulmonary effort is normal.  Musculoskeletal: Normal range of motion.  Skin:    General: Skin is warm and dry.  Neurological:     Mental Status: She is alert.  Psychiatric:        Mood and Affect: Mood normal.        Behavior: Behavior normal.     RECENT LABS AND TESTS: BMET    Component Value Date/Time   NA 132 (L) 03/31/2019 0934   NA 135 11/20/2018 1413   K 4.3 03/31/2019 0934   CL 97 03/31/2019 0934   CO2 28 03/31/2019 0934   GLUCOSE 78 03/31/2019 0934   BUN 20 03/31/2019 0934   BUN 21 11/20/2018 1413   CREATININE 0.61 03/31/2019 0934   CREATININE 0.59 10/01/2016 0939   CALCIUM 9.2 03/31/2019 0934   GFRNONAA >60 12/18/2018 0857   GFRAA >60 12/18/2018 0857   Lab Results  Component Value Date   HGBA1C 6.0 03/31/2019   HGBA1C 5.7 (H) 11/20/2018   HGBA1C 5.9 (H) 08/04/2018   Lab Results  Component Value Date   INSULIN  15.4 11/20/2018   INSULIN 33.5 (H) 08/04/2018   CBC    Component Value Date/Time   WBC 5.1 03/31/2019 0934   RBC 4.47 03/31/2019 0934   HGB 12.9 03/31/2019 0934   HCT 38.5 03/31/2019 0934   PLT 211.0 03/31/2019 0934   MCV 86.2 03/31/2019 0934   MCH 26.6 12/18/2018 0857   MCHC 33.5 03/31/2019 0934   RDW 14.2 03/31/2019 0934   LYMPHSABS 1.6 03/31/2019 0934   MONOABS 0.5 03/31/2019 0934   EOSABS 0.3 03/31/2019 0934   BASOSABS 0.0 03/31/2019 0934   Iron/TIBC/Ferritin/ %Sat No results found for: IRON, TIBC, FERRITIN, IRONPCTSAT Lipid Panel     Component Value Date/Time   CHOL 105 03/31/2019 0934   CHOL 99 (L) 09/30/2018 1108   TRIG 148.0 03/31/2019 0934   HDL 26.20 (L) 03/31/2019 0934   HDL 28 (L) 09/30/2018 1108   CHOLHDL 4 03/31/2019 0934   VLDL 29.6 03/31/2019 0934   LDLCALC 49 03/31/2019 0934   LDLCALC 45 09/30/2018 1108   LDLDIRECT 96.0 03/11/2018 0904   Hepatic Function Panel     Component Value Date/Time   PROT 7.0 03/31/2019 0934   PROT 7.3 11/20/2018 1413   ALBUMIN 4.0 03/31/2019 0934   ALBUMIN 4.0 11/20/2018 1413   AST 21 03/31/2019 0934   ALT 18 03/31/2019 0934   ALKPHOS  47 03/31/2019 0934   BILITOT 0.4 03/31/2019 0934   BILITOT 0.3 11/20/2018 1413   BILIDIR 0.1 03/31/2019 0934   IBILI 0.3 07/09/2016 0905      Component Value Date/Time   TSH 1.78 03/31/2019 0934   TSH 1.120 08/04/2018 0921   TSH 1.49 03/11/2018 0904      OBESITY BEHAVIORAL INTERVENTION VISIT  Today's visit was # 14   Starting weight: 213 lb Starting date: 08/04/18 Today's weight : Weight: 180 lb (81.6 kg)  Today's date: 05/04/2019 Total lbs lost to date: 33 lbs At least 15 minutes were spent on discussing the following behavioral intervention visit.   ASK: We discussed the diagnosis of obesity with Natalie Hall today and Natalie Hall agreed to give [name] permission to discuss obesity behavioral modification therapy today.  ASSESS: Natalie Hall has the diagnosis of obesity and her BMI  today is 30.88 Natalie Hall is in the action stage of change   ADVISE: Natalie Hall was educated on the multiple health risks of obesity as well as the benefit of weight loss to improve her health. She was advised of the need for long term treatment and the importance of lifestyle modifications to improve her current health and to decrease her risk of future health problems.  AGREE: Multiple dietary modification options and treatment options were discussed and  Natalie Hall agreed to follow the recommendations documented in the above note.  ARRANGE: Natalie Hall was educated on the importance of frequent visits to treat obesity as outlined per CMS and USPSTF guidelines and agreed to schedule her next follow up appointment today.  Natalie Hall, am acting as transcriptionist for [employer], DO   I have reviewed the above documentation for accuracy and completeness, and I agree with the above. -Jearld Lesch, DO

## 2019-05-18 ENCOUNTER — Ambulatory Visit: Payer: Medicare Other

## 2019-05-20 ENCOUNTER — Other Ambulatory Visit: Payer: Self-pay

## 2019-05-20 ENCOUNTER — Encounter (INDEPENDENT_AMBULATORY_CARE_PROVIDER_SITE_OTHER): Payer: Self-pay | Admitting: Bariatrics

## 2019-05-20 ENCOUNTER — Ambulatory Visit (INDEPENDENT_AMBULATORY_CARE_PROVIDER_SITE_OTHER): Payer: Medicare Other | Admitting: Bariatrics

## 2019-05-20 VITALS — BP 122/75 | HR 65 | Temp 98.2°F | Ht 64.0 in | Wt 178.0 lb

## 2019-05-20 DIAGNOSIS — Z6830 Body mass index (BMI) 30.0-30.9, adult: Secondary | ICD-10-CM

## 2019-05-20 DIAGNOSIS — E669 Obesity, unspecified: Secondary | ICD-10-CM | POA: Diagnosis not present

## 2019-05-20 DIAGNOSIS — I10 Essential (primary) hypertension: Secondary | ICD-10-CM

## 2019-05-20 DIAGNOSIS — R7303 Prediabetes: Secondary | ICD-10-CM | POA: Diagnosis not present

## 2019-05-20 NOTE — Progress Notes (Signed)
Office: [phone]  /  Fax: [phone]   HPI:   Chief Complaint: OBESITY Natalie Hall is here to discuss her progress with her obesity treatment plan. She is on the Category 2 plan and is following her eating plan approximately 90% of the time. She states she is exercising 0 minutes 0 times per week. Natalie Hall is down 2 lbs and is doing well overall and has lost 35 lbs. She has had some ankle pain and will see the orthopedist. Her goal weight is 150 lbs. Her weight is 178 lb (80.7 kg) today and has had a weight loss of 2 pounds over a period of 2 weeks since her last visit. She has lost 35 lbs since starting treatment with [name].  Pre-Diabetes Natalie Hall has a diagnosis of prediabetes based on her elevated Hgb A1c and was informed this puts her at greater risk of developing diabetes. Her last A1c was 6.0 on 03/31/2019 and her insulin was 13.6. She is not on any medications currently and continues to work on diet and exercise to decrease risk of diabetes.   Hypertension Natalie Hall is a 68 y.o. female with hypertension.  Natalie Hall denies lightheadedness. She is working weight loss to help control her blood pressure with the goal of decreasing her risk of heart attack and stroke. Natalie Hall's blood pressure is well controlled.  ASSESSMENT AND PLAN:  Prediabetes  Essential hypertension  Class 1 obesity with serious comorbidity and body mass index (BMI) of 30.0 to 30.9 in adult, unspecified obesity type  PLAN:  Pre-Diabetes Natalie Hall will continue to work on weight loss, exercise, and decreasing simple carbohydrates in her diet to help decrease the risk of diabetes. We dicussed metformin including benefits and risks. She was informed that eating too many simple carbohydrates or too many calories at one sitting increases the likelihood of GI side effects. Natalie Hall will decrease carbohydrates, increase protein, activity as tolerated, and follow-up with [name] as directed to monitor her progress.   Hypertension We discussed sodium restriction, working on healthy weight loss, and a regular exercise program as the means to achieve improved blood pressure control. Natalie Hall agreed with this plan and agreed to follow up as directed. We will continue to monitor her blood pressure as well as her progress with the above lifestyle modifications. She will continue her medications as prescribed and will watch for signs of hypotension as she continues her lifestyle modifications.  Obesity Natalie Hall is currently in the action stage of change. As such, her goal is to continue with weight loss efforts. She has agreed to follow the Category 2 plan. Natalie Hall will work on meal planning and intentional eating. Natalie Hall has been instructed to work with weights and resistance bands for weight loss and overall health benefits. We discussed the following Behavioral Modification Strategies today: increasing lean protein intake, decreasing simple carbohydrates, increasing vegetables, increase H20 intake, decrease eating out, no skipping meals, work on meal planning and easy cooking plans, and keeping healthy foods in the home.   Natalie Hall has agreed to follow-up with our clinic in 2 weeks. She was informed of the importance of frequent follow-up visits to maximize her success with intensive lifestyle modifications for her multiple health conditions.  ALLERGIES: Allergies  Allergen Reactions  . Codeine Other (See Comments), Shortness Of Breath and Rash    flushing flushing   . Levofloxacin In D5w Other (See Comments)    Leg pain  Leg pain   . Amoxicillin   . Amoxicillin-Pot Clavulanate Rash  Has patient had a PCN reaction causing immediate rash, facial/tongue/throat swelling, SOB or lightheadedness with hypotension: No Has patient had a PCN reaction causing severe rash involving mucus membranes or skin necrosis: No Has patient had a PCN reaction that required hospitalization: No Has patient had a PCN reaction  occurring within the last 10 years: Yes If all of the above answers are "NO", then may proceed with Cephalosporin use.  Has patient had a PCN reaction causing immediate rash, facial/tongue/throat swelling, SOB or lightheadedness with hypotension: No Has patient had a PCN reaction causing severe rash involving mucus membranes or skin necrosis: No Has patient had a PCN reaction that required hospitalization: No Has patient had a PCN reaction occurring within the last 10 years: Yes If all of the above answers are "NO", then may proceed with Cephalosporin use.  . Escitalopram Oxalate Rash    MEDICATIONS: Current Outpatient Medications on File Prior to Visit  Medication Sig Dispense Refill  . acetaminophen (TYLENOL) 500 MG tablet Take 500 mg by mouth every 6 (six) hours as needed (pain).     Marland Kitchen aspirin EC 81 MG tablet Take 81 mg by mouth daily.    . Calcium Carb-Cholecalciferol (CALCIUM PLUS VITAMIN D3) 600-800 MG-UNIT TABS Take 2 tablets by mouth daily.     . cetirizine (ZYRTEC) 10 MG tablet Take 10 mg by mouth daily as needed for allergies.    . Cholecalciferol (VITAMIN D3) 25 MCG (1000 UT) CAPS Take 1,000 Units by mouth daily.     . cyclobenzaprine (FLEXERIL) 10 MG tablet Take 1 tablet (10 mg total) by mouth 3 (three) times daily as needed for muscle spasms. 60 tablet 2  . diphenhydramine-acetaminophen (TYLENOL PM) 25-500 MG TABS tablet Take 2 tablets by mouth at bedtime as needed (for pain.).    Marland Kitchen ezetimibe (ZETIA) 10 MG tablet Take 1 tablet (10 mg total) by mouth daily. 90 tablet 3  . HYDROcodone-acetaminophen (NORCO) 5-325 MG tablet Take 1 tablet by mouth every 4 (four) hours as needed for moderate pain. 30 tablet 0  . metoprolol succinate (TOPROL-XL) 25 MG 24 hr tablet TAKE 1 TABLET BY MOUTH  TWICE A DAY 180 tablet 2  . nitroGLYCERIN (NITROSTAT) 0.4 MG SL tablet DISSOLVE 1 TABLET UNDER THE TONGUE EVERY 5 MINUTES FOR  3 DOSES AS NEEDED FOR CHEST PAIN (Patient taking differently: Place 0.4 mg  under the tongue every 5 (five) minutes as needed for chest pain. ) 100 tablet 1  . omeprazole (PRILOSEC) 40 MG capsule Take 1 capsule (40 mg total) by mouth daily. 90 capsule 3  . ramipril (ALTACE) 10 MG capsule TAKE 1 CAPSULE BY MOUTH  DAILY 90 capsule 2  . rosuvastatin (CRESTOR) 40 MG tablet TAKE 1 TABLET BY MOUTH  DAILY 90 tablet 2  . temazepam (RESTORIL) 15 MG capsule Take 1 capsule (15 mg total) by mouth at bedtime as needed for sleep. 90 capsule 1  . triamcinolone (NASACORT) 55 MCG/ACT nasal inhaler Place 2 sprays into the nose daily. (Patient taking differently: Place 2 sprays into the nose daily as needed (allergies). ) 3 Inhaler 3   No current facility-administered medications on file prior to visit.     PAST MEDICAL HISTORY: Past Medical History:  Diagnosis Date  . Asthma    infrequent problem - no treatment since 2006  . Back pain   . Choroidal nevus of left eye    sees Dr. Gerarda Fraction at [facility].   . Constipation   . Coronary artery  disease   . Dysrhythmia    SVT - ablation by Dr. Curt Bears on 05/21/2016 and episodes since  . Fatty liver   . GERD (gastroesophageal reflux disease)   . History of hiatal hernia    "very small"  . Hx of colonic polyp   . Hyperlipidemia   . Hypertension   . Joint pain   . Melanoma of eye, left ([place])    sees Dr. Cordelia Pen  . Myocardial infarction ([place])   . Osteoarthritis   . Shortness of breath   . Supraventricular tachycardia ([place])     PAST SURGICAL HISTORY: Past Surgical History:  Procedure Laterality Date  . ABDOMINAL HYSTERECTOMY    . ACHILLES TENDON REPAIR  10-07-11   torn on right, per Dr. Noemi Chapel   . ANKLE SURGERY     torn tendon left ankle  . BREAST BIOPSY    . CARDIAC CATHETERIZATION N/A 03/14/2016   Procedure: Left Heart Cath and Coronary Angiography;  Surgeon: Peter M Martinique, MD;  Location: [facility];  Service: Cardiovascular;  Laterality: N/A;  . CARDIAC CATHETERIZATION N/A 03/14/2016   Procedure:  Coronary Stent Intervention;  Surgeon: Peter M Martinique, MD;  Location: [facility];  Service: Cardiovascular;  Laterality: N/A;  . CARPAL TUNNEL RELEASE    . CATARACT EXTRACTION W/ INTRAOCULAR LENS IMPLANT Bilateral   . CHOLECYSTECTOMY    . COLONOSCOPY  06-23-14   per Dr. Ardis Hughs, adenomatous polyps, repeat in 5 yrs    . ELECTROPHYSIOLOGIC STUDY N/A 05/21/2016   Procedure: SVT Ablation;  Surgeon: [name], MD;  Location: [facility];  Service: Cardiovascular;  Laterality: N/A;  . EYE SURGERY Left 08/2016  . IR KYPHO THORACIC WITH BONE BIOPSY  12/18/2018  . IR RADIOLOGIST EVAL & MGMT  12/09/2018  . IR RADIOLOGIST EVAL & MGMT  04/30/2019  . KNEE CLOSED REDUCTION Left 02/03/2014   Procedure: CLOSED MANIPULATION LEFT KNEE;  Surgeon: Gearlean Alf, MD;  Location: [facility];  Service: Orthopedics;  Laterality: Left;  . POLYPECTOMY    . REVERSE SHOULDER ARTHROPLASTY     Left  . REVERSE SHOULDER ARTHROPLASTY Left 06/28/2017   Procedure: LEFT SHOULDER REVERSE SHOULDER ARTHROPLASTY;  Surgeon: Netta Cedars, MD;  Location: [facility];  Service: Orthopedics;  Laterality: Left;  . TOTAL KNEE ARTHROPLASTY Left 12/21/2013   Procedure: LEFT TOTAL KNEE ARTHROPLASTY;  Surgeon: Gearlean Alf, MD;  Location: [facility];  Service: Orthopedics;  Laterality: Left;  Marland Kitchen VARICOSE VEIN SURGERY      SOCIAL HISTORY: Social History   Tobacco Use  . Smoking status: Never Smoker  . Smokeless tobacco: Never Used  Substance Use Topics  . Alcohol use: No    Alcohol/week: 0.0 standard drinks  . Drug use: No    FAMILY HISTORY: Family History  Problem Relation Age of Onset  . Colon polyps Mother   . Diabetes Mother   . High blood pressure Mother   . High Cholesterol Mother   . Thyroid disease Mother   . Anxiety disorder Mother   . Colon cancer Maternal Aunt   . Liver cancer Maternal Aunt   . Breast cancer Other   . Coronary artery disease Other   . Colon cancer Other   . Diabetes Other   .  Hypertension Other   . Kidney disease Other   . Lung cancer Other   . Lung cancer Brother        spleen, bone  . Diabetes Father   . High  blood pressure Father   . High Cholesterol Father   . Heart disease Father   . Depression Father   . Obesity Father   . Esophageal cancer Neg Hx   . Stomach cancer Neg Hx   . Rectal cancer Neg Hx    ROS: Review of Systems  Neurological:       Negative for lightheadedness.   PHYSICAL EXAM: Blood pressure 122/75, pulse 65, temperature 98.2 F (36.8 C), temperature source Oral, height 5\' 4"  (1.626 m), weight 178 lb (80.7 kg), SpO2 98 %. Body mass index is 30.55 kg/m. Physical Exam Vitals signs reviewed.  Constitutional:      Appearance: Normal appearance. She is obese.  Cardiovascular:     Rate and Rhythm: Normal rate.     Pulses: Normal pulses.  Pulmonary:     Effort: Pulmonary effort is normal.     Breath sounds: Normal breath sounds.  Musculoskeletal: Normal range of motion.  Skin:    General: Skin is warm and dry.  Neurological:     Mental Status: She is alert and oriented to person, place, and time.  Psychiatric:        Behavior: Behavior normal.   RECENT LABS AND TESTS: BMET    Component Value Date/Time   NA 132 (L) 03/31/2019 0934   NA 135 11/20/2018 1413   K 4.3 03/31/2019 0934   CL 97 03/31/2019 0934   CO2 28 03/31/2019 0934   GLUCOSE 78 03/31/2019 0934   BUN 20 03/31/2019 0934   BUN 21 11/20/2018 1413   CREATININE 0.61 03/31/2019 0934   CREATININE 0.59 10/01/2016 0939   CALCIUM 9.2 03/31/2019 0934   GFRNONAA >60 12/18/2018 0857   GFRAA >60 12/18/2018 0857   Lab Results  Component Value Date   HGBA1C 6.0 03/31/2019   HGBA1C 5.7 (H) 11/20/2018   HGBA1C 5.9 (H) 08/04/2018   Lab Results  Component Value Date   INSULIN 15.4 11/20/2018   INSULIN 33.5 (H) 08/04/2018   CBC    Component Value Date/Time   WBC 5.1 03/31/2019 0934   RBC 4.47 03/31/2019 0934   HGB 12.9 03/31/2019 0934   HCT 38.5 03/31/2019  0934   PLT 211.0 03/31/2019 0934   MCV 86.2 03/31/2019 0934   MCH 26.6 12/18/2018 0857   MCHC 33.5 03/31/2019 0934   RDW 14.2 03/31/2019 0934   LYMPHSABS 1.6 03/31/2019 0934   MONOABS 0.5 03/31/2019 0934   EOSABS 0.3 03/31/2019 0934   BASOSABS 0.0 03/31/2019 0934   Iron/TIBC/Ferritin/ %Sat No results found for: IRON, TIBC, FERRITIN, IRONPCTSAT Lipid Panel     Component Value Date/Time   CHOL 105 03/31/2019 0934   CHOL 99 (L) 09/30/2018 1108   TRIG 148.0 03/31/2019 0934   HDL 26.20 (L) 03/31/2019 0934   HDL 28 (L) 09/30/2018 1108   CHOLHDL 4 03/31/2019 0934   VLDL 29.6 03/31/2019 0934   LDLCALC 49 03/31/2019 0934   LDLCALC 45 09/30/2018 1108   LDLDIRECT 96.0 03/11/2018 0904   Hepatic Function Panel     Component Value Date/Time   PROT 7.0 03/31/2019 0934   PROT 7.3 11/20/2018 1413   ALBUMIN 4.0 03/31/2019 0934   ALBUMIN 4.0 11/20/2018 1413   AST 21 03/31/2019 0934   ALT 18 03/31/2019 0934   ALKPHOS 47 03/31/2019 0934   BILITOT 0.4 03/31/2019 0934   BILITOT 0.3 11/20/2018 1413   BILIDIR 0.1 03/31/2019 0934   IBILI 0.3 07/09/2016 0905      Component Value Date/Time   TSH  1.78 03/31/2019 0934   TSH 1.120 08/04/2018 0921   TSH 1.49 03/11/2018 0904   Results for WILBUR, LABUDA (MRN [id number]) as of 05/20/2019 12:09  Ref. Range 11/20/2018 14:13  Vitamin D, 25-Hydroxy Latest Ref Range: 30.0 - 100.0 ng/mL 75.9   OBESITY BEHAVIORAL INTERVENTION VISIT  Today's visit was #15   Starting weight: 213 lbs Starting date: 08/04/2018 Today's weight: 178 lbs  Today's date: 05/20/2019 Total lbs lost to date: 35 At least 15 minutes were spent on discussing the following behavioral intervention visit.    05/20/2019  Height 5\' 4"  (1.626 m)  Weight 178 lb (80.7 kg)  BMI (Calculated) 30.54  BLOOD PRESSURE - SYSTOLIC 875  BLOOD PRESSURE - DIASTOLIC 75   Body Fat % 64.3 %  Total Body Water (lbs) 67.2 lbs   ASK: We discussed the diagnosis of obesity with Natalie Hall today  and Natalie Hall agreed to give [name] permission to discuss obesity behavioral modification therapy today.  ASSESS: Natalie Hall has the diagnosis of obesity and her BMI today is 30.7. Natalie Hall is in the action stage of change.   ADVISE: Natalie Hall was educated on the multiple health risks of obesity as well as the benefit of weight loss to improve her health. She was advised of the need for long term treatment and the importance of lifestyle modifications to improve her current health and to decrease her risk of future health problems.  AGREE: Multiple dietary modification options and treatment options were discussed and  Natalie Hall agreed to follow the recommendations documented in the above note.  ARRANGE: Natalie Hall was educated on the importance of frequent visits to treat obesity as outlined per CMS and USPSTF guidelines and agreed to schedule her next follow up appointment today.  Migdalia Dk, am acting as Location manager for [employer], DO  I have reviewed the above documentation for accuracy and completeness, and I agree with the above. -Jearld Lesch, DO

## 2019-05-22 ENCOUNTER — Encounter: Payer: Self-pay | Admitting: Gastroenterology

## 2019-06-04 ENCOUNTER — Ambulatory Visit (INDEPENDENT_AMBULATORY_CARE_PROVIDER_SITE_OTHER): Payer: Medicare Other | Admitting: Family Medicine

## 2019-06-04 ENCOUNTER — Other Ambulatory Visit: Payer: Self-pay

## 2019-06-04 ENCOUNTER — Encounter (INDEPENDENT_AMBULATORY_CARE_PROVIDER_SITE_OTHER): Payer: Self-pay | Admitting: Family Medicine

## 2019-06-04 VITALS — BP 120/71 | HR 57 | Temp 97.8°F | Ht 64.0 in | Wt 178.0 lb

## 2019-06-04 DIAGNOSIS — Z6830 Body mass index (BMI) 30.0-30.9, adult: Secondary | ICD-10-CM | POA: Diagnosis not present

## 2019-06-04 DIAGNOSIS — R7303 Prediabetes: Secondary | ICD-10-CM | POA: Diagnosis not present

## 2019-06-04 DIAGNOSIS — E669 Obesity, unspecified: Secondary | ICD-10-CM

## 2019-06-08 ENCOUNTER — Encounter: Payer: Self-pay | Admitting: Internal Medicine

## 2019-06-08 ENCOUNTER — Encounter (INDEPENDENT_AMBULATORY_CARE_PROVIDER_SITE_OTHER): Payer: Self-pay | Admitting: Family Medicine

## 2019-06-08 ENCOUNTER — Other Ambulatory Visit: Payer: Self-pay

## 2019-06-08 ENCOUNTER — Ambulatory Visit (INDEPENDENT_AMBULATORY_CARE_PROVIDER_SITE_OTHER): Payer: Medicare Other | Admitting: Internal Medicine

## 2019-06-08 VITALS — BP 143/72 | HR 59 | Ht 64.5 in | Wt 183.0 lb

## 2019-06-08 DIAGNOSIS — I2510 Atherosclerotic heart disease of native coronary artery without angina pectoris: Secondary | ICD-10-CM | POA: Diagnosis not present

## 2019-06-08 DIAGNOSIS — E782 Mixed hyperlipidemia: Secondary | ICD-10-CM | POA: Diagnosis not present

## 2019-06-08 DIAGNOSIS — I10 Essential (primary) hypertension: Secondary | ICD-10-CM

## 2019-06-08 MED ORDER — RAMIPRIL 10 MG PO CAPS: 10.0000 mg | ORAL_CAPSULE | Freq: Every day | ORAL | 3 refills | Status: DC

## 2019-06-08 MED ORDER — METOPROLOL SUCCINATE ER 25 MG PO TB24: 25.0000 mg | ORAL_TABLET | Freq: Two times a day (BID) | ORAL | 3 refills | Status: DC

## 2019-06-08 MED ORDER — EZETIMIBE 10 MG PO TABS: 10.0000 mg | ORAL_TABLET | Freq: Every day | ORAL | 3 refills | Status: DC

## 2019-06-08 MED ORDER — ROSUVASTATIN CALCIUM 40 MG PO TABS: 40.0000 mg | ORAL_TABLET | Freq: Every day | ORAL | 3 refills | Status: DC

## 2019-06-08 NOTE — Patient Instructions (Signed)
Medication Instructions:  Your physician recommends that you continue on your current medications as directed. Please refer to the Current Medication list given to you today.  If you need a refill on your cardiac medications before your next appointment, please call your pharmacy.   Lab work: FASTING lipid profile prior to your next visit If you have labs (blood work) drawn today and your tests are completely normal, you will receive your results only by: Marland Kitchen MyChart Message (if you have MyChart) OR . A paper copy in the mail If you have any lab test that is abnormal or we need to change your treatment, we will call you to review the results.  Follow-Up: At [facility], you and your health needs are our priority.  As part of our continuing mission to provide you with exceptional heart care, we have created designated Provider Care Teams.  These Care Teams include your primary Cardiologist (physician) and Advanced Practice Providers (APPs -  Physician Assistants and Nurse Practitioners) who all work together to provide you with the care you need, when you need it. You will need a follow up appointment in 6 months.  Please call our office 2 months in advance to schedule this appointment.  You may see Pixie Casino, MD or one of the following Advanced Practice Providers on your designated Care Team: [place], [place] . Fabian Sharp, PA-C  Any Other Special Instructions Will Be Listed Below (If Applicable).

## 2019-06-08 NOTE — Progress Notes (Signed)
Office: [phone]  /  Fax: [phone]   HPI:   Chief Complaint: OBESITY Natalie Hall is here to discuss her progress with her obesity treatment plan. She is on the Category 2 plan and is following her eating plan approximately 80% of the time. She states she is exercising 0 minutes 0 times per week. Natalie Hall has stuck to the plan well and is disappointed that she has not lost weight. She is unable to exercise due to Achilles tendonitis and may need surgery. She also has problems with her shoulder, which prohibits lifting weights.  Her weight is 178 lb (80.7 kg) today and has not lost weight since her last visit. She has lost 35 lbs since starting treatment with [name].  Pre-Diabetes Natalie Hall has a diagnosis of prediabetes based on her elevated Hgb A1c and was informed this puts her at greater risk of developing diabetes. Her A1c increased to 6.0 on 03/31/2019, up from 5.7 on 11/20/2018. She feels she was drinking more soda and eating more carbs. She is not taking metformin currently and continues to work on diet to decrease risk of diabetes.  ASSESSMENT AND PLAN:  Prediabetes  Class 1 obesity with serious comorbidity and body mass index (BMI) of 30.0 to 30.9 in adult, unspecified obesity type  PLAN:  Pre-Diabetes Natalie Hall will continue to work on weight loss, exercise, and decreasing simple carbohydrates in her diet to help decrease the risk of diabetes. We discussed metformin including benefits and risks. She was informed that eating too many simple carbohydrates or too many calories at one sitting increases the likelihood of GI side effects. Gina will decrease simple carbs and follow-up with [name] as directed to monitor her progress.  Obesity Natalie Hall is currently in the action stage of change. As such, her goal is to continue with weight loss efforts. She has agreed to follow the Category 2 plan and will add lunch options. Natalie Hall has not been prescribed exercise at this time. We discussed the  following Behavioral Modification Strategies today: decreasing simple carbohydrates.   Natalie Hall has agreed to follow-up with our clinic in 2-3 weeks. She was informed of the importance of frequent follow-up visits to maximize her success with intensive lifestyle modifications for her multiple health conditions.  ALLERGIES: Allergies  Allergen Reactions   Codeine Other (See Comments), Shortness Of Breath and Rash    flushing flushing    Levofloxacin In D5w Other (See Comments)    Leg pain  Leg pain    Amoxicillin    Amoxicillin-Pot Clavulanate Rash    Has patient had a PCN reaction causing immediate rash, facial/tongue/throat swelling, SOB or lightheadedness with hypotension: No Has patient had a PCN reaction causing severe rash involving mucus membranes or skin necrosis: No Has patient had a PCN reaction that required hospitalization: No Has patient had a PCN reaction occurring within the last 10 years: Yes If all of the above answers are "NO", then may proceed with Cephalosporin use.  Has patient had a PCN reaction causing immediate rash, facial/tongue/throat swelling, SOB or lightheadedness with hypotension: No Has patient had a PCN reaction causing severe rash involving mucus membranes or skin necrosis: No Has patient had a PCN reaction that required hospitalization: No Has patient had a PCN reaction occurring within the last 10 years: Yes If all of the above answers are "NO", then may proceed with Cephalosporin use.   Escitalopram Oxalate Rash    MEDICATIONS: Current Outpatient Medications on File Prior to Visit  Medication Sig Dispense Refill  acetaminophen (TYLENOL) 500 MG tablet Take 500 mg by mouth every 6 (six) hours as needed (pain).      aspirin EC 81 MG tablet Take 81 mg by mouth daily.     Calcium Carb-Cholecalciferol (CALCIUM PLUS VITAMIN D3) 600-800 MG-UNIT TABS Take 2 tablets by mouth daily.      cetirizine (ZYRTEC) 10 MG tablet Take 10 mg by mouth daily as  needed for allergies.     Cholecalciferol (VITAMIN D3) 25 MCG (1000 UT) CAPS Take 1,000 Units by mouth daily.      cyclobenzaprine (FLEXERIL) 10 MG tablet Take 1 tablet (10 mg total) by mouth 3 (three) times daily as needed for muscle spasms. 60 tablet 2   diphenhydramine-acetaminophen (TYLENOL PM) 25-500 MG TABS tablet Take 2 tablets by mouth at bedtime as needed (for pain.).     ezetimibe (ZETIA) 10 MG tablet Take 1 tablet (10 mg total) by mouth daily. 90 tablet 3   metoprolol succinate (TOPROL-XL) 25 MG 24 hr tablet TAKE 1 TABLET BY MOUTH  TWICE A DAY 180 tablet 2   nitroGLYCERIN (NITROSTAT) 0.4 MG SL tablet DISSOLVE 1 TABLET UNDER THE TONGUE EVERY 5 MINUTES FOR  3 DOSES AS NEEDED FOR CHEST PAIN (Patient taking differently: Place 0.4 mg under the tongue every 5 (five) minutes as needed for chest pain. ) 100 tablet 1   omeprazole (PRILOSEC) 40 MG capsule Take 1 capsule (40 mg total) by mouth daily. 90 capsule 3   ramipril (ALTACE) 10 MG capsule TAKE 1 CAPSULE BY MOUTH  DAILY 90 capsule 2   rosuvastatin (CRESTOR) 40 MG tablet TAKE 1 TABLET BY MOUTH  DAILY 90 tablet 2   temazepam (RESTORIL) 15 MG capsule Take 1 capsule (15 mg total) by mouth at bedtime as needed for sleep. 90 capsule 1   triamcinolone (NASACORT) 55 MCG/ACT nasal inhaler Place 2 sprays into the nose daily. (Patient taking differently: Place 2 sprays into the nose daily as needed (allergies). ) 3 Inhaler 3   No current facility-administered medications on file prior to visit.     PAST MEDICAL HISTORY: Past Medical History:  Diagnosis Date   Asthma    infrequent problem - no treatment since 2006   Back pain    Choroidal nevus of left eye    sees Dr. Gerarda Fraction at [place].    Constipation    Coronary artery disease    Dysrhythmia    SVT - ablation by Dr. Curt Bears on 05/21/2016 and episodes since   Fatty liver    GERD (gastroesophageal reflux disease)    History of hiatal hernia    "very small"    Hx of colonic polyp    Hyperlipidemia    Hypertension    Joint pain    Melanoma of eye, left ([place])    sees Dr. Cordelia Pen   Myocardial infarction [facility])    Osteoarthritis    Shortness of breath    Supraventricular tachycardia ([place])     PAST SURGICAL HISTORY: Past Surgical History:  Procedure Laterality Date   ABDOMINAL HYSTERECTOMY     ACHILLES TENDON REPAIR  10-07-11   torn on right, per Dr. Marge Duncans SURGERY     torn tendon left ankle   BREAST BIOPSY     CARDIAC CATHETERIZATION N/A 03/14/2016   Procedure: Left Heart Cath and Coronary Angiography;  Surgeon: Peter M Martinique, MD;  Location: [facility];  Service: Cardiovascular;  Laterality: N/A;   CARDIAC CATHETERIZATION N/A 03/14/2016  Procedure: Coronary Stent Intervention;  Surgeon: Peter M Martinique, MD;  Location: [facility];  Service: Cardiovascular;  Laterality: N/A;   CARPAL TUNNEL RELEASE     CATARACT EXTRACTION W/ INTRAOCULAR LENS IMPLANT Bilateral    CHOLECYSTECTOMY     COLONOSCOPY  06-23-14   per Dr. Ardis Hughs, adenomatous polyps, repeat in 5 yrs     ELECTROPHYSIOLOGIC STUDY N/A 05/21/2016   Procedure: SVT Ablation;  Surgeon: [name], MD;  Location: [facility];  Service: Cardiovascular;  Laterality: N/A;   EYE SURGERY Left 08/2016   IR KYPHO THORACIC WITH BONE BIOPSY  12/18/2018   IR RADIOLOGIST EVAL & MGMT  12/09/2018   IR RADIOLOGIST EVAL & MGMT  04/30/2019   KNEE CLOSED REDUCTION Left 02/03/2014   Procedure: CLOSED MANIPULATION LEFT KNEE;  Surgeon: Gearlean Alf, MD;  Location: [facility];  Service: Orthopedics;  Laterality: Left;   POLYPECTOMY     REVERSE SHOULDER ARTHROPLASTY     Left   REVERSE SHOULDER ARTHROPLASTY Left 06/28/2017   Procedure: LEFT SHOULDER REVERSE SHOULDER ARTHROPLASTY;  Surgeon: Netta Cedars, MD;  Location: [facility];  Service: Orthopedics;  Laterality: Left;   TOTAL KNEE ARTHROPLASTY Left 12/21/2013   Procedure: LEFT TOTAL KNEE ARTHROPLASTY;   Surgeon: Gearlean Alf, MD;  Location: [facility];  Service: Orthopedics;  Laterality: Left;   VARICOSE VEIN SURGERY      SOCIAL HISTORY: Social History   Tobacco Use   Smoking status: Never Smoker   Smokeless tobacco: Never Used  Substance Use Topics   Alcohol use: No    Alcohol/week: 0.0 standard drinks   Drug use: No    FAMILY HISTORY: Family History  Problem Relation Age of Onset   Colon polyps Mother    Diabetes Mother    High blood pressure Mother    High Cholesterol Mother    Thyroid disease Mother    Anxiety disorder Mother    Colon cancer Maternal Aunt    Liver cancer Maternal Aunt    Breast cancer Other    Coronary artery disease Other    Colon cancer Other    Diabetes Other    Hypertension Other    Kidney disease Other    Lung cancer Other    Lung cancer Brother        spleen, bone   Diabetes Father    High blood pressure Father    High Cholesterol Father    Heart disease Father    Depression Father    Obesity Father    Esophageal cancer Neg Hx    Stomach cancer Neg Hx    Rectal cancer Neg Hx    ROS: ROS none noted.  PHYSICAL EXAM: Blood pressure 120/71, pulse (!) 57, temperature 97.8 F (36.6 C), temperature source Oral, height 5\' 4"  (1.626 m), weight 178 lb (80.7 kg), SpO2 100 %. Body mass index is 30.55 kg/m. Physical Exam Vitals signs reviewed.  Constitutional:      Appearance: Normal appearance. She is obese.  Cardiovascular:     Rate and Rhythm: Normal rate.     Pulses: Normal pulses.  Pulmonary:     Effort: Pulmonary effort is normal.     Breath sounds: Normal breath sounds.  Musculoskeletal: Normal range of motion.  Skin:    General: Skin is warm and dry.  Neurological:     Mental Status: She is alert and oriented to person, place, and time.  Psychiatric:        Behavior: Behavior normal.  RECENT LABS AND TESTS: BMET    Component Value Date/Time   NA 132 (L) 03/31/2019 0934   NA 135 11/20/2018  1413   K 4.3 03/31/2019 0934   CL 97 03/31/2019 0934   CO2 28 03/31/2019 0934   GLUCOSE 78 03/31/2019 0934   BUN 20 03/31/2019 0934   BUN 21 11/20/2018 1413   CREATININE 0.61 03/31/2019 0934   CREATININE 0.59 10/01/2016 0939   CALCIUM 9.2 03/31/2019 0934   GFRNONAA >60 12/18/2018 0857   GFRAA >60 12/18/2018 0857   Lab Results  Component Value Date   HGBA1C 6.0 03/31/2019   HGBA1C 5.7 (H) 11/20/2018   HGBA1C 5.9 (H) 08/04/2018   Lab Results  Component Value Date   INSULIN 15.4 11/20/2018   INSULIN 33.5 (H) 08/04/2018   CBC    Component Value Date/Time   WBC 5.1 03/31/2019 0934   RBC 4.47 03/31/2019 0934   HGB 12.9 03/31/2019 0934   HCT 38.5 03/31/2019 0934   PLT 211.0 03/31/2019 0934   MCV 86.2 03/31/2019 0934   MCH 26.6 12/18/2018 0857   MCHC 33.5 03/31/2019 0934   RDW 14.2 03/31/2019 0934   LYMPHSABS 1.6 03/31/2019 0934   MONOABS 0.5 03/31/2019 0934   EOSABS 0.3 03/31/2019 0934   BASOSABS 0.0 03/31/2019 0934   Iron/TIBC/Ferritin/ %Sat No results found for: IRON, TIBC, FERRITIN, IRONPCTSAT Lipid Panel     Component Value Date/Time   CHOL 105 03/31/2019 0934   CHOL 99 (L) 09/30/2018 1108   TRIG 148.0 03/31/2019 0934   HDL 26.20 (L) 03/31/2019 0934   HDL 28 (L) 09/30/2018 1108   CHOLHDL 4 03/31/2019 0934   VLDL 29.6 03/31/2019 0934   LDLCALC 49 03/31/2019 0934   LDLCALC 45 09/30/2018 1108   LDLDIRECT 96.0 03/11/2018 0904   Hepatic Function Panel     Component Value Date/Time   PROT 7.0 03/31/2019 0934   PROT 7.3 11/20/2018 1413   ALBUMIN 4.0 03/31/2019 0934   ALBUMIN 4.0 11/20/2018 1413   AST 21 03/31/2019 0934   ALT 18 03/31/2019 0934   ALKPHOS 47 03/31/2019 0934   BILITOT 0.4 03/31/2019 0934   BILITOT 0.3 11/20/2018 1413   BILIDIR 0.1 03/31/2019 0934   IBILI 0.3 07/09/2016 0905      Component Value Date/Time   TSH 1.78 03/31/2019 0934   TSH 1.120 08/04/2018 0921   TSH 1.49 03/11/2018 0904   Results for CARLITA, WHITCOMB (MRN [id number]) as of  06/08/2019 10:33  Ref. Range 11/20/2018 14:13  Vitamin D, 25-Hydroxy Latest Ref Range: 30.0 - 100.0 ng/mL 75.9   OBESITY BEHAVIORAL INTERVENTION VISIT  Today's visit was #16  Starting weight: 213 lbs Starting date: 08/04/2018 Today's weight: 178 lbs  Today's date: 06/04/2019 Total lbs lost to date: 35 At least 15 minutes were spent on discussing the following behavioral intervention visit.    06/04/2019  Height 5\' 4"  (1.626 m)  Weight 178 lb (80.7 kg)  BMI (Calculated) 30.54  BLOOD PRESSURE - SYSTOLIC 623  BLOOD PRESSURE - DIASTOLIC 71   Body Fat % 76.2 %  Total Body Water (lbs) 67.8 lbs   ASK: We discussed the diagnosis of obesity with Natalie Hall today and Natalie Hall agreed to give [name] permission to discuss obesity behavioral modification therapy today.  ASSESS: Natalie Hall has the diagnosis of obesity and her BMI today is 30.7. Natalie Hall is in the action stage of change.   ADVISE: Natalie Hall was educated on the multiple health risks of obesity as well as the  benefit of weight loss to improve her health. She was advised of the need for long term treatment and the importance of lifestyle modifications to improve her current health and to decrease her risk of future health problems.  AGREE: Multiple dietary modification options and treatment options were discussed and  Natalie Hall agreed to follow the recommendations documented in the above note.  ARRANGE: Natalie Hall was educated on the importance of frequent visits to treat obesity as outlined per CMS and USPSTF guidelines and agreed to schedule her next follow up appointment today.  IMichaelene Hall, am acting as Location manager for [employer], FNP-C.  I have reviewed the above documentation for accuracy and completeness, and I agree with the above.  - Duron Meister, FNP-C.

## 2019-06-08 NOTE — Progress Notes (Addendum)
OFFICE NOTE  Chief Complaint:  Routine follow-up  Primary Care Physician: Laurey Morale, MD  HPI:  Natalie Hall is a 68 y.o. female who I last saw in the hospital a few weeks ago. She was admitted for an SVT which spontaneously converted in the ER on Cardizem and did not return. Unfortunately she had a non-ST elevation MI with troponin peak of 1.64. She underwent a Lexiscan Myoview which demonstrated a moderate region of reversibility in the mid and apical segments of the inferior lateral Edmister consistent with ischemia. EF was 61%. She then was referred to cardiac catheterization which demonstrated the following:  Cardiac Cath 05/24   Prox RCA lesion, 25% stenosed.  The left ventricular systolic function is normal.  Prox LAD to Mid LAD lesion, 15% stenosed.  Mid RCA-1 lesion, 95% stenosed.  Mid RCA-2 lesion, 95% stenosed. Post intervention, there is a 0% residual stenosis.SYNERGY DES 3X32 1. Single vessel obstructive CAD 2. Normal LV function 3. Successful stenting of the mid to distal RCA with a DES, SYNERGY DES 3X32 Plan: DAPT for one year. Patient may be a candidate for the TWILIGHT trial in which case study protocol will be followed. Anticipate DC in am.  Mrs. Rion returns today and is doing quite well. She denies any chest pain or worsening shortness of breath. She is compliant with her medications. She did enroll in the TWILIGHT trial. Unfortunately, she was considering shoulder surgery but this will be delayed due to the necessity of dual antiplatelet therapy with her recent stent. I advised her the Tylenol or her tramadol would be safe medicines used for pain.  07/13/2016  Since I last saw her, she was admitted to the hospital for adenosine-responsive AVNRT. She underwent successful catheter ablation and has not had recurrence. She denies any chest pain or dyspnea. She remains in the TWILIGHT trial with Brillinta. Recent lipid profilw shows LDL remains not at goal at  104. HDL low at 28. She is on Crestor 10 mg daily.  10/05/2016  Mrs. Portman returns today for follow-up. She seems to be doing well from a coronary standpoint. She denies any chest pain or worsening dyspnea. 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 She underwent placement of a radioactive seed which was ultimately removed after a week and she is recovering from that procedure. She was off of her aspirin for 1 week but remained on Brilinta. She is still in the twilight trial. Her cholesterol was higher than it had been previously. I increased her Crestor to 20 mg daily and we are awaiting her lipid profile which was recently drawn. She denies any recurrent AVNRT.  06/10/2017  Mrs. Ricard was seen today in follow-up. She was seen in the [facility] for follow-up of the Twilight trial - this has completed. She is now more than one year since drug-eluting stent placement. She is angina free. At this point she will no longer need dual antiplatelet therapy and I advised that she go on aspirin 81 mg daily. Cholesterol is now at goal based on labs 3 months ago which showed total cholesterol 139, triglycerides 177, HL 34 and LDL-C is 69. She is on Crestor 40 mg daily. She is contemplating left shoulder surgery and from my standpoint she is clear for that.  12/30/2017  Mrs. Abram was seen today in follow-up.  This is an annual follow-up.  Overall she is doing well.  She had a successful shoulder surgery and  has good range of motion in her left shoulder.  She has been under a lot of stress taking care of her mother-in-law who recently passed away.  She did have labs in December which showed total cholesterol 146, triglycerides 196, HDL 32 and LDL 75.  She does report she had some weight gain and decreased activity and I feel like her cholesterol could be better controlled with more vigilant diet.  She denies any recurrent chest pain or worsening shortness of breath.  07/14/2018   Mrs. Liese is seen today for routine follow-up. It has been 6 months with a plan for weight loss and dietary changes to get cholesterol to goal. Unfortunately, weight is not significantly different. Repeat lipid profile shows cholesterol is higher with TC 157, TG 241, HDL 28 a nd LDL 81. Her goal LDL is <70. I had not recommended additional medication due to her commitment to making dietary changes, however, at this point, she still has additional residual risk and her cholesterol is actually higher, so I'm recommending therapy.  06/08/2019  Mrs. Germer is seen today in follow-up.  Overall she seems to be doing well.  She has been working closely with the weight management center at [facility] and is lost a significant weight.  She went from 213 pounds down to 183 in the office today however she says weighs in the 170s at home.  Overall she remains asymptomatic, denies chest pain or worsening shortness of breath.  EKG shows a sinus bradycardia in the 50s today.  Her labs all look excellent recently.  Lipids 2 months ago showed total cholesterol 105, triglycerides 148, HDL 26 and LDL 49. Also, she is seeking clearance for achilles surgery with Dr. Doran Durand.  PMHx:  Past Medical History:  Diagnosis Date  . Asthma    infrequent problem - no treatment since 2006  . Back pain   . Choroidal nevus of left eye    sees Dr. Gerarda Fraction at [facility].   . Constipation   . Coronary artery disease   . Dysrhythmia    SVT - ablation by Dr. Curt Bears on 05/21/2016 and episodes since  . Fatty liver   . GERD (gastroesophageal reflux disease)   . History of hiatal hernia    "very small"  . Hx of colonic polyp   . Hyperlipidemia   . Hypertension   . Joint pain   . Melanoma of eye, left ([place])    sees Dr. Cordelia Pen  . Myocardial infarction ([place])   . Osteoarthritis   . Shortness of breath   . Supraventricular tachycardia [facility])     Past Surgical History:  Procedure Laterality Date  . ABDOMINAL HYSTERECTOMY     . ACHILLES TENDON REPAIR  10-07-11   torn on right, per Dr. Noemi Chapel   . ANKLE SURGERY     torn tendon left ankle  . BREAST BIOPSY    . CARDIAC CATHETERIZATION N/A 03/14/2016   Procedure: Left Heart Cath and Coronary Angiography;  Surgeon: Peter M Martinique, MD;  Location: [place] [facility];  Service: Cardiovascular;  Laterality: N/A;  . CARDIAC CATHETERIZATION N/A 03/14/2016   Procedure: Coronary Stent Intervention;  Surgeon: Peter M Martinique, MD;  Location: [place] [facility];  Service: Cardiovascular;  Laterality: N/A;  . CARPAL TUNNEL RELEASE    . CATARACT EXTRACTION W/ INTRAOCULAR LENS IMPLANT Bilateral   . CHOLECYSTECTOMY    . COLONOSCOPY  06-23-14   per Dr. Ardis Hughs, adenomatous polyps, repeat in 5 yrs    .  ELECTROPHYSIOLOGIC STUDY N/A 05/21/2016   Procedure: SVT Ablation;  Surgeon: [name], MD;  Location: [place] [facility];  Service: Cardiovascular;  Laterality: N/A;  . EYE SURGERY Left 08/2016  . IR KYPHO THORACIC WITH BONE BIOPSY  12/18/2018  . IR RADIOLOGIST EVAL & MGMT  12/09/2018  . IR RADIOLOGIST EVAL & MGMT  04/30/2019  . KNEE CLOSED REDUCTION Left 02/03/2014   Procedure: CLOSED MANIPULATION LEFT KNEE;  Surgeon: Gearlean Alf, MD;  Location: [facility];  Service: Orthopedics;  Laterality: Left;  . POLYPECTOMY    . REVERSE SHOULDER ARTHROPLASTY     Left  . REVERSE SHOULDER ARTHROPLASTY Left 06/28/2017   Procedure: LEFT SHOULDER REVERSE SHOULDER ARTHROPLASTY;  Surgeon: Netta Cedars, MD;  Location: [facility];  Service: Orthopedics;  Laterality: Left;  . TOTAL KNEE ARTHROPLASTY Left 12/21/2013   Procedure: LEFT TOTAL KNEE ARTHROPLASTY;  Surgeon: Gearlean Alf, MD;  Location: [facility];  Service: Orthopedics;  Laterality: Left;  Marland Kitchen VARICOSE VEIN SURGERY      FAMHx:  Family History  Problem Relation Age of Onset  . Colon polyps Mother   . Diabetes Mother   . High blood pressure Mother   . High Cholesterol Mother   . Thyroid disease Mother   . Anxiety disorder Mother   . Colon  cancer Maternal Aunt   . Liver cancer Maternal Aunt   . Breast cancer Other   . Coronary artery disease Other   . Colon cancer Other   . Diabetes Other   . Hypertension Other   . Kidney disease Other   . Lung cancer Other   . Lung cancer Brother        spleen, bone  . Diabetes Father   . High blood pressure Father   . High Cholesterol Father   . Heart disease Father   . Depression Father   . Obesity Father   . Esophageal cancer Neg Hx   . Stomach cancer Neg Hx   . Rectal cancer Neg Hx     SOCHx:   reports that she has never smoked. She has never used smokeless tobacco. She reports that she does not drink alcohol or use drugs.  ALLERGIES:  Allergies  Allergen Reactions  . Codeine Other (See Comments), Shortness Of Breath and Rash    flushing flushing   . Levofloxacin In D5w Other (See Comments)    Leg pain  Leg pain   . Amoxicillin   . Amoxicillin-Pot Clavulanate Rash    Has patient had a PCN reaction causing immediate rash, facial/tongue/throat swelling, SOB or lightheadedness with hypotension: No Has patient had a PCN reaction causing severe rash involving mucus membranes or skin necrosis: No Has patient had a PCN reaction that required hospitalization: No Has patient had a PCN reaction occurring within the last 10 years: Yes If all of the above answers are "NO", then may proceed with Cephalosporin use.  Has patient had a PCN reaction causing immediate rash, facial/tongue/throat swelling, SOB or lightheadedness with hypotension: No Has patient had a PCN reaction causing severe rash involving mucus membranes or skin necrosis: No Has patient had a PCN reaction that required hospitalization: No Has patient had a PCN reaction occurring within the last 10 years: Yes If all of the above answers are "NO", then may proceed with Cephalosporin use.  . Escitalopram Oxalate Rash    ROS: Pertinent items noted in HPI and remainder of comprehensive ROS otherwise negative.   HOME MEDS: Current Outpatient Medications  Medication  Sig Dispense Refill  . acetaminophen (TYLENOL) 500 MG tablet Take 500 mg by mouth every 6 (six) hours as needed (pain).     Marland Kitchen aspirin EC 81 MG tablet Take 81 mg by mouth daily.    . Calcium Carb-Cholecalciferol (CALCIUM PLUS VITAMIN D3) 600-800 MG-UNIT TABS Take 2 tablets by mouth daily.     . cetirizine (ZYRTEC) 10 MG tablet Take 10 mg by mouth daily as needed for allergies.    . Cholecalciferol (VITAMIN D3) 25 MCG (1000 UT) CAPS Take 1,000 Units by mouth daily.     . cyclobenzaprine (FLEXERIL) 10 MG tablet Take 1 tablet (10 mg total) by mouth 3 (three) times daily as needed for muscle spasms. 60 tablet 2  . diphenhydramine-acetaminophen (TYLENOL PM) 25-500 MG TABS tablet Take 2 tablets by mouth at bedtime as needed (for pain.).    Marland Kitchen ezetimibe (ZETIA) 10 MG tablet Take 1 tablet (10 mg total) by mouth daily. 90 tablet 3  . metoprolol succinate (TOPROL-XL) 25 MG 24 hr tablet TAKE 1 TABLET BY MOUTH  TWICE A DAY 180 tablet 2  . nitroGLYCERIN (NITROSTAT) 0.4 MG SL tablet DISSOLVE 1 TABLET UNDER THE TONGUE EVERY 5 MINUTES FOR  3 DOSES AS NEEDED FOR CHEST PAIN (Patient taking differently: Place 0.4 mg under the tongue every 5 (five) minutes as needed for chest pain. ) 100 tablet 1  . omeprazole (PRILOSEC) 40 MG capsule Take 1 capsule (40 mg total) by mouth daily. 90 capsule 3  . ramipril (ALTACE) 10 MG capsule TAKE 1 CAPSULE BY MOUTH  DAILY 90 capsule 2  . rosuvastatin (CRESTOR) 40 MG tablet TAKE 1 TABLET BY MOUTH  DAILY 90 tablet 2  . temazepam (RESTORIL) 15 MG capsule Take 1 capsule (15 mg total) by mouth at bedtime as needed for sleep. 90 capsule 1  . triamcinolone (NASACORT) 55 MCG/ACT nasal inhaler Place 2 sprays into the nose daily. (Patient taking differently: Place 2 sprays into the nose daily as needed (allergies). ) 3 Inhaler 3   No current facility-administered medications for this visit.     LABS/IMAGING: No results found for this or  any previous visit (from the past 48 hour(s)). No results found.  WEIGHTS: Wt Readings from Last 3 Encounters:  06/08/19 183 lb (83 kg)  06/04/19 178 lb (80.7 kg)  05/20/19 178 lb (80.7 kg)    VITALS: BP (!) 143/72   Pulse (!) 59   Ht 5' 4.5" (1.638 m)   Wt 183 lb (83 kg)   LMP  (LMP Unknown)   SpO2 93%   BMI 30.93 kg/m   EXAM: General appearance: alert and no distress Neck: no carotid bruit and no JVD Lungs: clear to auscultation bilaterally Heart: regular rate and rhythm, S1, S2 normal, no murmur, click, rub or gallop Abdomen: soft, non-tender; bowel sounds normal; no masses,  no organomegaly Extremities: extremities normal, atraumatic, no cyanosis or edema Pulses: 2+ and symmetric Skin: Skin color, texture, turgor normal. No rashes or lesions Neurologic: Grossly normal Psych: Pleasant  EKG: Sinus bradycardia 59-personally reviewed  ASSESSMENT: 1. Coronary artery disease status post PCI with a drug-eluting stent to the mid/distal RCA (02/2016) 2. AVNRT - s/p catheter ablation in 04/2016 3. S/p NSTEMI with abnormal nuclear stress test 4. Dyslipidemia 5. Hypertension  PLAN: 1.   Mrs. Balla has had significant recent intentional weight loss which have commended her on.  This is also additionally helped her lipid profile.  Blood pressure appears good at home.  She denies any  worsening tachycardia or chest pain.  She is now 3 years out from stenting to the RCA.  No changes to her medicines today. From a cardiac standpoint, ok for achilles surgery with Dr. Doran Durand. Can hold aspirin 7 days prior to case and restart after.  Follow-up with me in 6 months or sooner as necessary.  Pixie Casino, MD, [facility], [place] Director of the [facility] Lipid Disorders &  Cardiovascular Risk Reduction Clinic Diplomate of the American Board of Clinical Lipidology Attending Cardiologist  Direct Dial: [phone]  Fax: [phone]  Website:   [email or web address] 06/08/2019, 10:36 AM

## 2019-06-09 ENCOUNTER — Telehealth: Payer: Self-pay

## 2019-06-09 NOTE — Telephone Encounter (Signed)
   [facility] Pre-operative Risk Assessment    Request for surgical clearance:  1. What type of surgery is being performed? LEFT ACHILLES RECON    2. When is this surgery scheduled? TBD   3. What type of clearance is required (medical clearance vs. Pharmacy clearance to hold med vs. Both)? BOTH  4. Are there any medications that need to be held prior to surgery and how long? ASA   5. Practice name and name of physician performing surgery? [facility]  DR Jenny Reichmann [place] ATTN:KELLY HANCOCK  6. What is your office phone number  [phone]    7.   What is your office fax number [phone]  8.   Anesthesia type (None, local, MAC, general) ? GENERAL   Natalie Hall 06/09/2019, 1:14 PM  _________________________________________________________________   (provider comments below)

## 2019-06-09 NOTE — Telephone Encounter (Signed)
   Primary Cardiologist: Pixie Casino, MD  Chart reviewed as part of pre-operative protocol coverage. She was seen by Dr. Debara Pickett 06/08/2019 and deemed to be at acceptable risk for the planned procedure without further cardiovascular testing.   She can hold aspirin 7 days prior to her upcoming achilles reconstruction surgery. Aspirin should be restarted as soon as she is cleared to do so by Dr. Doran Durand.  I will route this recommendation to the requesting party via Epic fax function and remove from pre-op pool.  Please call with questions.  Abigail Butts, PA-C 06/09/2019, 3:09 PM

## 2019-06-18 ENCOUNTER — Ambulatory Visit (INDEPENDENT_AMBULATORY_CARE_PROVIDER_SITE_OTHER): Payer: Medicare Other | Admitting: Family Medicine

## 2019-06-18 ENCOUNTER — Encounter (INDEPENDENT_AMBULATORY_CARE_PROVIDER_SITE_OTHER): Payer: Self-pay | Admitting: Family Medicine

## 2019-06-18 ENCOUNTER — Other Ambulatory Visit: Payer: Self-pay

## 2019-06-18 VITALS — BP 107/65 | HR 71 | Temp 97.9°F | Ht 64.0 in | Wt 176.0 lb

## 2019-06-18 DIAGNOSIS — Z6830 Body mass index (BMI) 30.0-30.9, adult: Secondary | ICD-10-CM | POA: Diagnosis not present

## 2019-06-18 DIAGNOSIS — R7303 Prediabetes: Secondary | ICD-10-CM | POA: Diagnosis not present

## 2019-06-18 DIAGNOSIS — E669 Obesity, unspecified: Secondary | ICD-10-CM | POA: Diagnosis not present

## 2019-06-21 NOTE — Progress Notes (Signed)
Office: [phone]  /  Fax: [phone]   HPI:   Chief Complaint: OBESITY Natalie Hall is here to discuss her progress with her obesity treatment plan. She is on the Category 2 plan with lunch optionsk and is following her eating plan approximately 80-90 % of the time. She states she is exercising 0 minutes 0 times per week. Tre will have left achilles surgery on 06/30/2019. She will not be weight bearing for 6 weeks post op. She would like to wait for follow up until the 1st of November. She is doing well on the plan and has been steadily losing weight.  Her weight is 176 lb (79.8 kg) today and has had a weight loss of 2 pounds over a period of 2 weeks since her last visit. She has lost 37 lbs since starting treatment with [name].  Pre-Diabetes Natalie Hall has a diagnosis of pre-diabetes based on her elevated Hgb A1c and was informed this puts her at greater risk of developing diabetes. We discussed adding metformin, but she declined. She does have some hunger between meals. Last A1c was 6.0. She continues to work on diet and exercise to decrease risk of diabetes.   ASSESSMENT AND PLAN:  Prediabetes  Class 1 obesity with serious comorbidity and body mass index (BMI) of 30.0 to 30.9 in adult, unspecified obesity type  PLAN:  Pre-Diabetes Natalie Hall will continue her meal plan, and will continue to work on weight loss, exercise, and decreasing simple carbohydrates in her diet to help decrease the risk of diabetes.  Zera declined metformin for now and a prescription was not written today. Adamary agrees to follow up with our clinic in 8 weeks as directed to monitor her progress.  Obesity Natalie Hall is currently in the action stage of change. As such, her goal is to continue with weight loss efforts She has agreed to follow the Category 2 plan Natalie Hall has not been prescribed exercise at this time.  We discussed the following Behavioral Modification Strategies today: work on meal planning and easy cooking  plans, keeping healthy foods in the home, and planning for success We discussed meal and shopping strategies while she is non-weight bearing for 6 weeks after surgery. I suggested shopping online for groceries.  Natalie Hall has agreed to follow up with our clinic in 8 weeks. She was informed of the importance of frequent follow up visits to maximize her success with intensive lifestyle modifications for her multiple health conditions.  ALLERGIES: Allergies  Allergen Reactions  . Codeine Other (See Comments), Shortness Of Breath and Rash    flushing flushing   . Levofloxacin In D5w Other (See Comments)    Leg pain  Leg pain   . Amoxicillin   . Amoxicillin-Pot Clavulanate Rash    Has patient had a PCN reaction causing immediate rash, facial/tongue/throat swelling, SOB or lightheadedness with hypotension: No Has patient had a PCN reaction causing severe rash involving mucus membranes or skin necrosis: No Has patient had a PCN reaction that required hospitalization: No Has patient had a PCN reaction occurring within the last 10 years: Yes If all of the above answers are "NO", then may proceed with Cephalosporin use.  Has patient had a PCN reaction causing immediate rash, facial/tongue/throat swelling, SOB or lightheadedness with hypotension: No Has patient had a PCN reaction causing severe rash involving mucus membranes or skin necrosis: No Has patient had a PCN reaction that required hospitalization: No Has patient had a PCN reaction occurring within the last 10 years: Yes If all  of the above answers are "NO", then may proceed with Cephalosporin use.  . Escitalopram Oxalate Rash    MEDICATIONS: Current Outpatient Medications on File Prior to Visit  Medication Sig Dispense Refill  . acetaminophen (TYLENOL) 500 MG tablet Take 500 mg by mouth every 6 (six) hours as needed (pain).     Natalie Hall aspirin EC 81 MG tablet Take 81 mg by mouth daily.    . Calcium Carb-Cholecalciferol (CALCIUM PLUS VITAMIN  D3) 600-800 MG-UNIT TABS Take 2 tablets by mouth daily.     . cetirizine (ZYRTEC) 10 MG tablet Take 10 mg by mouth daily as needed for allergies.    . Cholecalciferol (VITAMIN D3) 25 MCG (1000 UT) CAPS Take 1,000 Units by mouth daily.     . cyclobenzaprine (FLEXERIL) 10 MG tablet Take 1 tablet (10 mg total) by mouth 3 (three) times daily as needed for muscle spasms. 60 tablet 2  . diphenhydramine-acetaminophen (TYLENOL PM) 25-500 MG TABS tablet Take 2 tablets by mouth at bedtime as needed (for pain.).    Natalie Hall ezetimibe (ZETIA) 10 MG tablet Take 1 tablet (10 mg total) by mouth daily. 90 tablet 3  . metoprolol succinate (TOPROL-XL) 25 MG 24 hr tablet Take 1 tablet (25 mg total) by mouth 2 (two) times daily. 180 tablet 3  . nitroGLYCERIN (NITROSTAT) 0.4 MG SL tablet DISSOLVE 1 TABLET UNDER THE TONGUE EVERY 5 MINUTES FOR  3 DOSES AS NEEDED FOR CHEST PAIN (Patient taking differently: Place 0.4 mg under the tongue every 5 (five) minutes as needed for chest pain. ) 100 tablet 1  . omeprazole (PRILOSEC) 40 MG capsule Take 1 capsule (40 mg total) by mouth daily. 90 capsule 3  . ramipril (ALTACE) 10 MG capsule Take 1 capsule (10 mg total) by mouth daily. 90 capsule 3  . rosuvastatin (CRESTOR) 40 MG tablet Take 1 tablet (40 mg total) by mouth daily. 90 tablet 3  . temazepam (RESTORIL) 15 MG capsule Take 1 capsule (15 mg total) by mouth at bedtime as needed for sleep. 90 capsule 1  . triamcinolone (NASACORT) 55 MCG/ACT nasal inhaler Place 2 sprays into the nose daily. (Patient taking differently: Place 2 sprays into the nose daily as needed (allergies). ) 3 Inhaler 3   No current facility-administered medications on file prior to visit.     PAST MEDICAL HISTORY: Past Medical History:  Diagnosis Date  . Asthma    infrequent problem - no treatment since 2006  . Back pain   . Choroidal nevus of left eye    sees Dr. Gerarda Fraction at [facility].   . Constipation   . Coronary artery disease   .  Dysrhythmia    SVT - ablation by Dr. Curt Bears on 05/21/2016 and episodes since  . Fatty liver   . GERD (gastroesophageal reflux disease)   . History of hiatal hernia    "very small"  . Hx of colonic polyp   . Hyperlipidemia   . Hypertension   . Joint pain   . Melanoma of eye, left ([place])    sees Dr. Cordelia Pen  . Myocardial infarction ([place])   . Osteoarthritis   . Shortness of breath   . Supraventricular tachycardia ([place])     PAST SURGICAL HISTORY: Past Surgical History:  Procedure Laterality Date  . ABDOMINAL HYSTERECTOMY    . ACHILLES TENDON REPAIR  10-07-11   torn on right, per Dr. Noemi Chapel   . ANKLE SURGERY     torn tendon left ankle  .  BREAST BIOPSY    . CARDIAC CATHETERIZATION N/A 03/14/2016   Procedure: Left Heart Cath and Coronary Angiography;  Surgeon: Peter M Martinique, MD;  Location: [place] [facility];  Service: Cardiovascular;  Laterality: N/A;  . CARDIAC CATHETERIZATION N/A 03/14/2016   Procedure: Coronary Stent Intervention;  Surgeon: Peter M Martinique, MD;  Location: [place] [facility];  Service: Cardiovascular;  Laterality: N/A;  . CARPAL TUNNEL RELEASE    . CATARACT EXTRACTION W/ INTRAOCULAR LENS IMPLANT Bilateral   . CHOLECYSTECTOMY    . COLONOSCOPY  06-23-14   per Dr. Ardis Hughs, adenomatous polyps, repeat in 5 yrs    . ELECTROPHYSIOLOGIC STUDY N/A 05/21/2016   Procedure: SVT Ablation;  Surgeon: [name], MD;  Location: [place] [facility];  Service: Cardiovascular;  Laterality: N/A;  . EYE SURGERY Left 08/2016  . IR KYPHO THORACIC WITH BONE BIOPSY  12/18/2018  . IR RADIOLOGIST EVAL & MGMT  12/09/2018  . IR RADIOLOGIST EVAL & MGMT  04/30/2019  . KNEE CLOSED REDUCTION Left 02/03/2014   Procedure: CLOSED MANIPULATION LEFT KNEE;  Surgeon: Gearlean Alf, MD;  Location: [facility];  Service: Orthopedics;  Laterality: Left;  . POLYPECTOMY    . REVERSE SHOULDER ARTHROPLASTY     Left  . REVERSE SHOULDER ARTHROPLASTY Left 06/28/2017   Procedure: LEFT SHOULDER REVERSE SHOULDER  ARTHROPLASTY;  Surgeon: Netta Cedars, MD;  Location: [place];  Service: Orthopedics;  Laterality: Left;  . TOTAL KNEE ARTHROPLASTY Left 12/21/2013   Procedure: LEFT TOTAL KNEE ARTHROPLASTY;  Surgeon: Gearlean Alf, MD;  Location: [facility];  Service: Orthopedics;  Laterality: Left;  Natalie Hall VARICOSE VEIN SURGERY      SOCIAL HISTORY: Social History   Tobacco Use  . Smoking status: Never Smoker  . Smokeless tobacco: Never Used  Substance Use Topics  . Alcohol use: No    Alcohol/week: 0.0 standard drinks  . Drug use: No    FAMILY HISTORY: Family History  Problem Relation Age of Onset  . Colon polyps Mother   . Diabetes Mother   . High blood pressure Mother   . High Cholesterol Mother   . Thyroid disease Mother   . Anxiety disorder Mother   . Colon cancer Maternal Aunt   . Liver cancer Maternal Aunt   . Breast cancer Other   . Coronary artery disease Other   . Colon cancer Other   . Diabetes Other   . Hypertension Other   . Kidney disease Other   . Lung cancer Other   . Lung cancer Brother        spleen, bone  . Diabetes Father   . High blood pressure Father   . High Cholesterol Father   . Heart disease Father   . Depression Father   . Obesity Father   . Esophageal cancer Neg Hx   . Stomach cancer Neg Hx   . Rectal cancer Neg Hx     ROS: Review of Systems  Constitutional: Positive for weight loss.    PHYSICAL EXAM: Blood pressure 107/65, pulse 71, temperature 97.9 F (36.6 C), temperature source Oral, height 5\' 4"  (1.626 m), weight 176 lb (79.8 kg), SpO2 98 %. Body mass index is 30.21 kg/m. Physical Exam Vitals signs reviewed.  Constitutional:      Appearance: Normal appearance. She is obese.  Cardiovascular:     Rate and Rhythm: Normal rate.     Pulses: Normal pulses.  Pulmonary:     Effort: Pulmonary effort is normal.  Breath sounds: Normal breath sounds.  Musculoskeletal: Normal range of motion.  Skin:    General: Skin is warm and dry.  Neurological:      Mental Status: She is alert and oriented to person, place, and time.  Psychiatric:        Mood and Affect: Mood normal.        Behavior: Behavior normal.     RECENT LABS AND TESTS: BMET    Component Value Date/Time   NA 132 (L) 03/31/2019 0934   NA 135 11/20/2018 1413   K 4.3 03/31/2019 0934   CL 97 03/31/2019 0934   CO2 28 03/31/2019 0934   GLUCOSE 78 03/31/2019 0934   BUN 20 03/31/2019 0934   BUN 21 11/20/2018 1413   CREATININE 0.61 03/31/2019 0934   CREATININE 0.59 10/01/2016 0939   CALCIUM 9.2 03/31/2019 0934   GFRNONAA >60 12/18/2018 0857   GFRAA >60 12/18/2018 0857   Lab Results  Component Value Date   HGBA1C 6.0 03/31/2019   HGBA1C 5.7 (H) 11/20/2018   HGBA1C 5.9 (H) 08/04/2018   Lab Results  Component Value Date   INSULIN 15.4 11/20/2018   INSULIN 33.5 (H) 08/04/2018   CBC    Component Value Date/Time   WBC 5.1 03/31/2019 0934   RBC 4.47 03/31/2019 0934   HGB 12.9 03/31/2019 0934   HCT 38.5 03/31/2019 0934   PLT 211.0 03/31/2019 0934   MCV 86.2 03/31/2019 0934   MCH 26.6 12/18/2018 0857   MCHC 33.5 03/31/2019 0934   RDW 14.2 03/31/2019 0934   LYMPHSABS 1.6 03/31/2019 0934   MONOABS 0.5 03/31/2019 0934   EOSABS 0.3 03/31/2019 0934   BASOSABS 0.0 03/31/2019 0934   Iron/TIBC/Ferritin/ %Sat No results found for: IRON, TIBC, FERRITIN, IRONPCTSAT Lipid Panel     Component Value Date/Time   CHOL 105 03/31/2019 0934   CHOL 99 (L) 09/30/2018 1108   TRIG 148.0 03/31/2019 0934   HDL 26.20 (L) 03/31/2019 0934   HDL 28 (L) 09/30/2018 1108   CHOLHDL 4 03/31/2019 0934   VLDL 29.6 03/31/2019 0934   LDLCALC 49 03/31/2019 0934   LDLCALC 45 09/30/2018 1108   LDLDIRECT 96.0 03/11/2018 0904   Hepatic Function Panel     Component Value Date/Time   PROT 7.0 03/31/2019 0934   PROT 7.3 11/20/2018 1413   ALBUMIN 4.0 03/31/2019 0934   ALBUMIN 4.0 11/20/2018 1413   AST 21 03/31/2019 0934   ALT 18 03/31/2019 0934   ALKPHOS 47 03/31/2019 0934   BILITOT 0.4  03/31/2019 0934   BILITOT 0.3 11/20/2018 1413   BILIDIR 0.1 03/31/2019 0934   IBILI 0.3 07/09/2016 0905      Component Value Date/Time   TSH 1.78 03/31/2019 0934   TSH 1.120 08/04/2018 0921   TSH 1.49 03/11/2018 0904      OBESITY BEHAVIORAL INTERVENTION VISIT  Today's visit was # 17   Starting weight: 213 lbs Starting date: 08/04/18 Today's weight : 176 lbs  Today's date: 06/18/2019 Total lbs lost to date: 37 At least 15 minutes were spent on discussing the following behavioral intervention visit.   ASK: We discussed the diagnosis of obesity with [name] today and Namari agreed to give [name] permission to discuss obesity behavioral modification therapy today.  ASSESS: Lynnell has the diagnosis of obesity and her BMI today is 30.2 Brexleigh is in the action stage of change   ADVISE: Tritia was educated on the multiple health risks of obesity as well as the benefit of  weight loss to improve her health. She was advised of the need for long term treatment and the importance of lifestyle modifications to improve her current health and to decrease her risk of future health problems.  AGREE: Multiple dietary modification options and treatment options were discussed and  Jahmiya agreed to follow the recommendations documented in the above note.  ARRANGE: Yamel was educated on the importance of frequent visits to treat obesity as outlined per CMS and USPSTF guidelines and agreed to schedule her next follow up appointment today.  [name], am acting as transcriptionist for [employer], FNP-C  I have reviewed the above documentation for accuracy and completeness, and I agree with the above.  - Gwen Edler, FNP-C.

## 2019-06-24 ENCOUNTER — Other Ambulatory Visit (HOSPITAL_COMMUNITY): Payer: Self-pay | Admitting: Orthopedic Surgery

## 2019-06-24 ENCOUNTER — Encounter (INDEPENDENT_AMBULATORY_CARE_PROVIDER_SITE_OTHER): Payer: Self-pay | Admitting: Family Medicine

## 2019-06-25 ENCOUNTER — Encounter (HOSPITAL_BASED_OUTPATIENT_CLINIC_OR_DEPARTMENT_OTHER): Payer: Self-pay | Admitting: *Deleted

## 2019-06-25 ENCOUNTER — Other Ambulatory Visit: Payer: Self-pay

## 2019-06-30 ENCOUNTER — Other Ambulatory Visit (HOSPITAL_COMMUNITY)
Admission: RE | Admit: 2019-06-30 | Discharge: 2019-06-30 | Disposition: A | Discharge status: Home / Self Care | Payer: Medicare Other | Source: Ambulatory Visit | Attending: Orthopedic Surgery | Admitting: Orthopedic Surgery

## 2019-06-30 DIAGNOSIS — Z01812 Encounter for preprocedural laboratory examination: Secondary | ICD-10-CM | POA: Insufficient documentation

## 2019-06-30 DIAGNOSIS — Z20828 Contact with and (suspected) exposure to other viral communicable diseases: Secondary | ICD-10-CM | POA: Insufficient documentation

## 2019-06-30 LAB — SARS CORONAVIRUS 2 (TAT 6-24 HRS): SARS Coronavirus 2: NEGATIVE

## 2019-06-30 NOTE — Progress Notes (Signed)

## 2019-07-02 ENCOUNTER — Ambulatory Visit (HOSPITAL_BASED_OUTPATIENT_CLINIC_OR_DEPARTMENT_OTHER): Payer: Medicare Other | Admitting: Certified Registered"

## 2019-07-02 ENCOUNTER — Encounter (HOSPITAL_BASED_OUTPATIENT_CLINIC_OR_DEPARTMENT_OTHER)
Admission: RE | Disposition: A | Discharge status: Home / Self Care | Payer: Self-pay | Source: Home / Self Care | Attending: Orthopedic Surgery

## 2019-07-02 ENCOUNTER — Encounter (HOSPITAL_BASED_OUTPATIENT_CLINIC_OR_DEPARTMENT_OTHER): Payer: Self-pay

## 2019-07-02 ENCOUNTER — Ambulatory Visit (HOSPITAL_BASED_OUTPATIENT_CLINIC_OR_DEPARTMENT_OTHER)
Admission: RE | Admit: 2019-07-02 | Discharge: 2019-07-02 | Disposition: A | Discharge status: Home / Self Care | Payer: Medicare Other | Attending: Orthopedic Surgery | Admitting: Orthopedic Surgery

## 2019-07-02 ENCOUNTER — Other Ambulatory Visit: Payer: Self-pay

## 2019-07-02 DIAGNOSIS — M1990 Unspecified osteoarthritis, unspecified site: Secondary | ICD-10-CM | POA: Diagnosis not present

## 2019-07-02 DIAGNOSIS — M6702 Short Achilles tendon (acquired), left ankle: Secondary | ICD-10-CM | POA: Insufficient documentation

## 2019-07-02 DIAGNOSIS — Z888 Allergy status to other drugs, medicaments and biological substances status: Secondary | ICD-10-CM | POA: Insufficient documentation

## 2019-07-02 DIAGNOSIS — Z96612 Presence of left artificial shoulder joint: Secondary | ICD-10-CM | POA: Diagnosis not present

## 2019-07-02 DIAGNOSIS — M7662 Achilles tendinitis, left leg: Secondary | ICD-10-CM | POA: Insufficient documentation

## 2019-07-02 DIAGNOSIS — I252 Old myocardial infarction: Secondary | ICD-10-CM | POA: Insufficient documentation

## 2019-07-02 DIAGNOSIS — I10 Essential (primary) hypertension: Secondary | ICD-10-CM | POA: Diagnosis not present

## 2019-07-02 DIAGNOSIS — J45909 Unspecified asthma, uncomplicated: Secondary | ICD-10-CM | POA: Insufficient documentation

## 2019-07-02 DIAGNOSIS — Z7982 Long term (current) use of aspirin: Secondary | ICD-10-CM | POA: Diagnosis not present

## 2019-07-02 DIAGNOSIS — Z880 Allergy status to penicillin: Secondary | ICD-10-CM | POA: Diagnosis not present

## 2019-07-02 DIAGNOSIS — Z881 Allergy status to other antibiotic agents status: Secondary | ICD-10-CM | POA: Insufficient documentation

## 2019-07-02 DIAGNOSIS — Z885 Allergy status to narcotic agent status: Secondary | ICD-10-CM | POA: Diagnosis not present

## 2019-07-02 DIAGNOSIS — Z79899 Other long term (current) drug therapy: Secondary | ICD-10-CM | POA: Diagnosis not present

## 2019-07-02 DIAGNOSIS — K219 Gastro-esophageal reflux disease without esophagitis: Secondary | ICD-10-CM | POA: Diagnosis not present

## 2019-07-02 DIAGNOSIS — I2510 Atherosclerotic heart disease of native coronary artery without angina pectoris: Secondary | ICD-10-CM | POA: Insufficient documentation

## 2019-07-02 DIAGNOSIS — Z96652 Presence of left artificial knee joint: Secondary | ICD-10-CM | POA: Insufficient documentation

## 2019-07-02 DIAGNOSIS — M216X2 Other acquired deformities of left foot: Secondary | ICD-10-CM | POA: Diagnosis not present

## 2019-07-02 DIAGNOSIS — E785 Hyperlipidemia, unspecified: Secondary | ICD-10-CM | POA: Diagnosis not present

## 2019-07-02 HISTORY — PX: EXCISION HAGLUND'S DEFORMITY WITH ACHILLES TENDON REPAIR: SHX5627

## 2019-07-02 HISTORY — PX: GASTROCNEMIUS RECESSION: SHX863

## 2019-07-02 SURGERY — RECESSION, MUSCLE, GASTROCNEMIUS
Anesthesia: General | Site: Leg Lower | Laterality: Left

## 2019-07-02 MED ORDER — LACTATED RINGERS IV SOLN
INTRAVENOUS | Status: DC
  Administered 2019-07-02 (×2): via INTRAVENOUS

## 2019-07-02 MED ORDER — ROCURONIUM BROMIDE 10 MG/ML (PF) SYRINGE
PREFILLED_SYRINGE | INTRAVENOUS | Status: AC
  Filled 2019-07-02: qty 20

## 2019-07-02 MED ORDER — ONDANSETRON HCL 4 MG/2ML IJ SOLN
INTRAMUSCULAR | Status: DC | PRN
  Administered 2019-07-02: 4 mg via INTRAVENOUS

## 2019-07-02 MED ORDER — FENTANYL CITRATE (PF) 100 MCG/2ML IJ SOLN
INTRAMUSCULAR | Status: AC
  Filled 2019-07-02: qty 2

## 2019-07-02 MED ORDER — SUCCINYLCHOLINE CHLORIDE 20 MG/ML IJ SOLN
INTRAMUSCULAR | Status: DC | PRN
  Administered 2019-07-02: 100 mg via INTRAVENOUS

## 2019-07-02 MED ORDER — FENTANYL CITRATE (PF) 100 MCG/2ML IJ SOLN
INTRAMUSCULAR | Status: DC | PRN
  Administered 2019-07-02: 25 ug via INTRAVENOUS

## 2019-07-02 MED ORDER — DEXAMETHASONE SODIUM PHOSPHATE 4 MG/ML IJ SOLN
INTRAMUSCULAR | Status: DC | PRN
  Administered 2019-07-02: 4 mg via INTRAVENOUS

## 2019-07-02 MED ORDER — EPHEDRINE 5 MG/ML INJ
INTRAVENOUS | Status: AC
  Filled 2019-07-02: qty 20

## 2019-07-02 MED ORDER — SODIUM CHLORIDE 0.9 % IV SOLN: INTRAVENOUS | Status: DC

## 2019-07-02 MED ORDER — FENTANYL CITRATE (PF) 100 MCG/2ML IJ SOLN
25.0000 ug | INTRAMUSCULAR | Status: DC | PRN
  Administered 2019-07-02: 25 ug via INTRAVENOUS

## 2019-07-02 MED ORDER — CLONIDINE HCL (ANALGESIA) 100 MCG/ML EP SOLN
EPIDURAL | Status: DC | PRN
  Administered 2019-07-02: 100 ug

## 2019-07-02 MED ORDER — MIDAZOLAM HCL 2 MG/2ML IJ SOLN
INTRAMUSCULAR | Status: AC
  Filled 2019-07-02: qty 2

## 2019-07-02 MED ORDER — ACETAMINOPHEN 500 MG PO TABS
1000.0000 mg | ORAL_TABLET | Freq: Once | ORAL | Status: AC
  Administered 2019-07-02: 12:00:00 1000 mg via ORAL

## 2019-07-02 MED ORDER — FENTANYL CITRATE (PF) 100 MCG/2ML IJ SOLN
100.0000 ug | Freq: Once | INTRAMUSCULAR | Status: AC
  Administered 2019-07-02: 50 ug via INTRAVENOUS

## 2019-07-02 MED ORDER — LIDOCAINE HCL (CARDIAC) PF 100 MG/5ML IV SOSY
PREFILLED_SYRINGE | INTRAVENOUS | Status: DC | PRN
  Administered 2019-07-02: 30 mg via INTRAVENOUS

## 2019-07-02 MED ORDER — 0.9 % SODIUM CHLORIDE (POUR BTL) OPTIME
TOPICAL | Status: DC | PRN
  Administered 2019-07-02: 200 mL

## 2019-07-02 MED ORDER — MIDAZOLAM HCL 2 MG/2ML IJ SOLN
1.0000 mg | INTRAMUSCULAR | Status: DC | PRN
  Administered 2019-07-02: 1 mg via INTRAVENOUS

## 2019-07-02 MED ORDER — ROPIVACAINE HCL 5 MG/ML IJ SOLN
INTRAMUSCULAR | Status: DC | PRN
  Administered 2019-07-02: 30 mL via PERINEURAL

## 2019-07-02 MED ORDER — PROPOFOL 10 MG/ML IV BOLUS
INTRAVENOUS | Status: DC | PRN
  Administered 2019-07-02: 100 mg via INTRAVENOUS

## 2019-07-02 MED ORDER — ONDANSETRON HCL 4 MG/2ML IJ SOLN: 4.0000 mg | Freq: Once | INTRAMUSCULAR | Status: DC | PRN

## 2019-07-02 MED ORDER — ACETAMINOPHEN 500 MG PO TABS
ORAL_TABLET | ORAL | Status: AC
  Filled 2019-07-02: qty 2

## 2019-07-02 MED ORDER — CEFAZOLIN SODIUM-DEXTROSE 2-4 GM/100ML-% IV SOLN
INTRAVENOUS | Status: AC
  Filled 2019-07-02: qty 100

## 2019-07-02 MED ORDER — PHENYLEPHRINE 40 MCG/ML (10ML) SYRINGE FOR IV PUSH (FOR BLOOD PRESSURE SUPPORT)
PREFILLED_SYRINGE | INTRAVENOUS | Status: AC
  Filled 2019-07-02: qty 30

## 2019-07-02 MED ORDER — OXYCODONE HCL 5 MG PO TABS: 5.0000 mg | ORAL_TABLET | Freq: Four times a day (QID) | ORAL | 0 refills | Status: DC | PRN

## 2019-07-02 MED ORDER — CEFAZOLIN SODIUM-DEXTROSE 2-4 GM/100ML-% IV SOLN
2.0000 g | INTRAVENOUS | Status: AC
  Administered 2019-07-02: 2 g via INTRAVENOUS

## 2019-07-02 MED ORDER — CHLORHEXIDINE GLUCONATE 4 % EX LIQD: 60.0000 mL | Freq: Once | CUTANEOUS | Status: DC

## 2019-07-02 MED ORDER — PROPOFOL 500 MG/50ML IV EMUL
INTRAVENOUS | Status: DC | PRN
  Administered 2019-07-02: 25 ug/kg/min via INTRAVENOUS

## 2019-07-02 MED ORDER — ACETAMINOPHEN 10 MG/ML IV SOLN: 1000.0000 mg | Freq: Once | INTRAVENOUS | Status: DC | PRN

## 2019-07-02 SURGICAL SUPPLY — 90 items
ANCH SUT 2 2.9 2 LD TPR NDL (Anchor) ×4 IMPLANT
ANCH SUT 4.75 LNK KNTLS STRL (Anchor) ×4 IMPLANT
ANCHOR JUGGERKNOT WTAP NDL 2.9 (Anchor) ×2 IMPLANT
ANCHOR KNTLS VENTIX 4.75 (Anchor) ×2 IMPLANT
APL PRP STRL LF DISP 70% ISPRP (MISCELLANEOUS) ×2
BANDAGE ESMARK 6X9 LF (GAUZE/BANDAGES/DRESSINGS) ×2 IMPLANT
BIT DRILL JUGRKNT W/NDL BIT2.9 (DRILL) IMPLANT
BLADE AVERAGE 25X9 (BLADE) IMPLANT
BLADE MICRO SAGITTAL (BLADE) ×3 IMPLANT
BLADE SURG 15 STRL LF DISP TIS (BLADE) ×4 IMPLANT
BLADE SURG 15 STRL SS (BLADE) ×9
BNDG CMPR 9X6 STRL LF SNTH (GAUZE/BANDAGES/DRESSINGS) ×2
BNDG COHESIVE 4X5 TAN STRL (GAUZE/BANDAGES/DRESSINGS) ×3 IMPLANT
BNDG COHESIVE 6X5 TAN STRL LF (GAUZE/BANDAGES/DRESSINGS) ×3 IMPLANT
BNDG ELASTIC 4X5.8 VLCR STR LF (GAUZE/BANDAGES/DRESSINGS) IMPLANT
BNDG ESMARK 6X9 LF (GAUZE/BANDAGES/DRESSINGS) ×3
BOOT STEPPER DURA LG (SOFTGOODS) IMPLANT
BOOT STEPPER DURA MED (SOFTGOODS) IMPLANT
BOOT STEPPER DURA XLG (SOFTGOODS) IMPLANT
CANISTER SUCT 1200ML W/VALVE (MISCELLANEOUS) IMPLANT
CHLORAPREP W/TINT 26 (MISCELLANEOUS) ×3 IMPLANT
COVER BACK TABLE REUSABLE LG (DRAPES) ×3 IMPLANT
COVER WAND RF STERILE (DRAPES) IMPLANT
CUFF TOURN SGL QUICK 34 (TOURNIQUET CUFF) ×3
CUFF TRNQT CYL 34X4.125X (TOURNIQUET CUFF) ×2 IMPLANT
DRAPE EXTREMITY T 121X128X90 (DISPOSABLE) ×3 IMPLANT
DRAPE HALF SHEET 70X43 (DRAPES) ×3 IMPLANT
DRAPE OEC MINIVIEW 54X84 (DRAPES) IMPLANT
DRAPE U-SHAPE 47X51 STRL (DRAPES) ×3 IMPLANT
DRILL JUGGERKNOT W/NDL BIT 2.9 (DRILL) ×3
DRSG MEPITEL 4X7.2 (GAUZE/BANDAGES/DRESSINGS) ×3 IMPLANT
DRSG PAD ABDOMINAL 8X10 ST (GAUZE/BANDAGES/DRESSINGS) ×6 IMPLANT
ELECT REM PT RETURN 9FT ADLT (ELECTROSURGICAL) ×3
ELECTRODE REM PT RTRN 9FT ADLT (ELECTROSURGICAL) ×2 IMPLANT
GAUZE SPONGE 4X4 12PLY STRL (GAUZE/BANDAGES/DRESSINGS) ×3 IMPLANT
GLOVE BIO SURGEON STRL SZ7 (GLOVE) ×1 IMPLANT
GLOVE BIO SURGEON STRL SZ8 (GLOVE) ×3 IMPLANT
GLOVE BIOGEL PI IND STRL 7.0 (GLOVE) IMPLANT
GLOVE BIOGEL PI IND STRL 7.5 (GLOVE) IMPLANT
GLOVE BIOGEL PI IND STRL 8 (GLOVE) ×4 IMPLANT
GLOVE BIOGEL PI INDICATOR 7.0 (GLOVE) ×1
GLOVE BIOGEL PI INDICATOR 7.5 (GLOVE) ×1
GLOVE BIOGEL PI INDICATOR 8 (GLOVE) ×1
GLOVE ECLIPSE 8.0 STRL XLNG CF (GLOVE) ×2 IMPLANT
GOWN STRL REUS W/ TWL LRG LVL3 (GOWN DISPOSABLE) ×2 IMPLANT
GOWN STRL REUS W/ TWL XL LVL3 (GOWN DISPOSABLE) ×4 IMPLANT
GOWN STRL REUS W/TWL LRG LVL3 (GOWN DISPOSABLE)
GOWN STRL REUS W/TWL XL LVL3 (GOWN DISPOSABLE) ×6
KIT BIO-TENODESIS 3X8 DISP (MISCELLANEOUS)
KIT INSRT BABSR STRL DISP BTN (MISCELLANEOUS) IMPLANT
NDL HYPO 25X1 1.5 SAFETY (NEEDLE) IMPLANT
NDL SAFETY ECLIPSE 18X1.5 (NEEDLE) IMPLANT
NDL SUT 6 .5 CRC .975X.05 MAYO (NEEDLE) IMPLANT
NEEDLE HYPO 18GX1.5 SHARP (NEEDLE)
NEEDLE HYPO 22GX1.5 SAFETY (NEEDLE) IMPLANT
NEEDLE HYPO 25X1 1.5 SAFETY (NEEDLE) IMPLANT
NEEDLE MAYO TAPER (NEEDLE)
NS IRRIG 1000ML POUR BTL (IV SOLUTION) ×3 IMPLANT
PACK BASIN DAY SURGERY FS (CUSTOM PROCEDURE TRAY) ×3 IMPLANT
PAD CAST 4YDX4 CTTN HI CHSV (CAST SUPPLIES) ×2 IMPLANT
PADDING CAST COTTON 4X4 STRL (CAST SUPPLIES) ×3
PADDING CAST COTTON 6X4 STRL (CAST SUPPLIES) ×3 IMPLANT
PENCIL BUTTON HOLSTER BLD 10FT (ELECTRODE) ×3 IMPLANT
SANITIZER HAND PURELL 535ML FO (MISCELLANEOUS) ×3 IMPLANT
SLEEVE SCD COMPRESS KNEE MED (MISCELLANEOUS) ×3 IMPLANT
SPLINT FAST PLASTER 5X30 (CAST SUPPLIES) ×20
SPLINT PLASTER CAST FAST 5X30 (CAST SUPPLIES) IMPLANT
SPONGE LAP 18X18 RF (DISPOSABLE) ×3 IMPLANT
STAPLER VISISTAT 35W (STAPLE) IMPLANT
STOCKINETTE 6  STRL (DRAPES) ×1
STOCKINETTE 6 STRL (DRAPES) ×2 IMPLANT
SUCTION FRAZIER HANDLE 10FR (MISCELLANEOUS) ×1
SUCTION TUBE FRAZIER 10FR DISP (MISCELLANEOUS) IMPLANT
SUT ETHIBOND 2 OS 4 DA (SUTURE) IMPLANT
SUT ETHIBOND 3-0 V-5 (SUTURE) IMPLANT
SUT ETHILON 3 0 PS 1 (SUTURE) ×3 IMPLANT
SUT FIBERWIRE #2 38 T-5 BLUE (SUTURE)
SUT MNCRL AB 3-0 PS2 18 (SUTURE) ×3 IMPLANT
SUT VIC AB 0 CT1 27 (SUTURE)
SUT VIC AB 0 CT1 27XBRD ANBCTR (SUTURE) IMPLANT
SUT VIC AB 0 SH 27 (SUTURE) ×1 IMPLANT
SUT VIC AB 2-0 SH 27 (SUTURE)
SUT VIC AB 2-0 SH 27XBRD (SUTURE) IMPLANT
SUTURE FIBERWR #2 38 T-5 BLUE (SUTURE) IMPLANT
SYR BULB 3OZ (MISCELLANEOUS) ×3 IMPLANT
SYR CONTROL 10ML LL (SYRINGE) IMPLANT
TOWEL GREEN STERILE FF (TOWEL DISPOSABLE) ×6 IMPLANT
TUBE CONNECTING 20X1/4 (TUBING) ×1 IMPLANT
UNDERPAD 30X36 HEAVY ABSORB (UNDERPADS AND DIAPERS) ×3 IMPLANT
YANKAUER SUCT BULB TIP NO VENT (SUCTIONS) IMPLANT

## 2019-07-02 NOTE — Anesthesia Preprocedure Evaluation (Addendum)
Anesthesia Evaluation  Patient identified by MRN, date of birth, ID band Patient awake    Reviewed: Allergy & Precautions, NPO status , Patient's Chart, lab work & pertinent test results  Airway Mallampati: II  TM Distance: >3 FB Neck ROM: Full    Dental no notable dental hx. (+) Teeth Intact   Pulmonary asthma ,    Pulmonary exam normal breath sounds clear to auscultation       Cardiovascular Exercise Tolerance: Good hypertension, Pt. on medications and Pt. on home beta blockers Normal cardiovascular exam+ dysrhythmias Supra Ventricular Tachycardia  Rhythm:Regular Rate:Normal     Neuro/Psych negative psych ROS   GI/Hepatic Neg liver ROS, GERD  ,  Endo/Other  negative endocrine ROS  Renal/GU negative Renal ROS     Musculoskeletal  (+) Arthritis ,   Abdominal   Peds  Hematology negative hematology ROS (+)   Anesthesia Other Findings   Reproductive/Obstetrics                          Anesthesia Physical Anesthesia Plan  ASA: III  Anesthesia Plan: General   Post-op Pain Management: GA combined w/ Regional for post-op pain   Induction: Intravenous  PONV Risk Score and Plan: 3 and Treatment may vary due to age or medical condition, Dexamethasone, Ondansetron and Midazolam  Airway Management Planned: Oral ETT  Additional Equipment:   Intra-op Plan:   Post-operative Plan: Extubation in OR  Informed Consent: I have reviewed the patients History and Physical, chart, labs and discussed the procedure including the risks, benefits and alternatives for the proposed anesthesia with the patient or authorized representative who has indicated his/her understanding and acceptance.     Dental advisory given  Plan Discussed with:   Anesthesia Plan Comments: (GA w L Popliteal block)       Anesthesia Quick Evaluation

## 2019-07-02 NOTE — Op Note (Signed)
07/02/2019  11:36 AM  PATIENT:  Natalie Hall  68 y.o. female  PRE-OPERATIVE DIAGNOSIS: 1.  Left achilles insertional tendonitis      2.  Left short heelcord      3.  Left calcaneus Haglund deformity  POST-OPERATIVE DIAGNOSIS:  Same  Procedure(s): 1.  Left gastrocnemius recession 2.  Excision of left calcaneus Haglund deformity 3.  Left Achilles tendon debridement and reconstruction  SURGEON:  Wylene Simmer, MD  ASSISTANT: none  ANESTHESIA:   General, regional  EBL:  minimal   TOURNIQUET:   Total Tourniquet Time Documented: Thigh (Left) - 46 minutes Total: Thigh (Left) - 46 minutes  COMPLICATIONS:  None apparent  DISPOSITION:  Extubated, awake and stable to recovery.  INDICATION FOR PROCEDURE: The patient is a 68 year old female with a long history of left heel pain due to Achilles tendinitis.  She also has a prominent Haglund deformity and a tight heel cord.  She has failed nonoperative treatment to date and presents today for surgical treatment.    The risks and benefits of the alternative treatment options have been discussed in detail.  The patient wishes to proceed with surgery and specifically understands risks of bleeding, infection, nerve damage, blood clots, need for additional surgery, amputation and death.  PROCEDURE IN DETAIL: After preoperative consent was obtained and the correct operative site was identified, the patient was brought to the operating room supine on a stretcher.  Preoperative antibiotics were administered followed by general anesthesia.  A surgical timeout was taken.  The left lower extremity was exsanguinated and a thigh tourniquet inflated to 250 mmHg.  The patient was then turned into the prone position on the operating room table with all bony prominences padded well.  The left lower extremity was then prepped and draped in standard sterile fashion.  A longitudinal incision was made over the calf.  Dissection was carried down through the subcutaneous  tissues.  Care was taken to protect the sural nerve and lesser saphenous vein.  The gastrocnemius tendon was identified.  It was divided under direct vision in its entirety.  The wound was irrigated and closed with Monocryl and nylon.  Attention was turned to the posterior aspect of the heel where a longitudinal incision was made in the midline.  Dissection was carried down through the subcutaneous tissues and down through the peritenon exposing the tendon.  The tendon was then split longitudinally and released medially and laterally from its insertion on the calcaneus.  The patient was noted to have a large insertional enthesophyte and a prominent Haglund deformity.  An oscillating saw was used to resect both bony prominences in their entirety.  The cut surfaces of bone were smoothed with a rasp.  The wound was irrigated copiously.  The Achilles tendon was then debrided of all degenerated tendon.  The debrided tendon was then repaired to the cut surface of bone with 4 suture anchors in an hourglass pattern of nonabsorbable suture.  The ankle could be dorsiflexed to neutral with no gapping of the tendon at the repair site.  The wound was again irrigated copiously.  The peritenon was approximated over the tendon with inverted simple sutures of 2-0 Vicryl.  Subcutaneous tissues were approximated with Monocryl.  The skin incision was closed with horizontal mattress sutures of 3-0 nylon.  Sterile dressings were applied followed by a well-padded short leg splint.  The tourniquet was released after application of the dressings.  The patient was awakened from anesthesia and transported to the recovery  room in stable condition.  FOLLOW UP PLAN:  NWB on the L LE.  F/u in two weeks.  Aspirin for DVT prophylaxis.

## 2019-07-02 NOTE — Anesthesia Procedure Notes (Signed)
Anesthesia Regional Block: Popliteal block   Pre-Anesthetic Checklist: ,, timeout performed, Correct Patient, Correct Site, Correct Laterality, Correct Procedure, Correct Position, site marked, Risks and benefits discussed, pre-op evaluation,  At surgeon's request and post-op pain management  Laterality: Left  Prep: Maximum Sterile Barrier Precautions used, chloraprep       Needles:  Injection technique: Single-shot  Needle Type: Echogenic Needle     Needle Length: 9cm  Needle Gauge: 21     Additional Needles:   Procedures:,,,, ultrasound used (permanent image in chart),,,,  Narrative:  Start time: 07/02/2019 9:44 AM End time: 07/02/2019 9:50 AM Injection made incrementally with aspirations every 5 mL.  Performed by: Personally  Anesthesiologist: Barnet Glasgow, MD  Additional Notes: Block assessed. Patient tolerated procedure well.

## 2019-07-02 NOTE — Discharge Instructions (Addendum)
Wylene Simmer, MD [facility]  Please read the following information regarding your care after surgery.  Medications  You only need a prescription for the narcotic pain medicine (ex. oxycodone, Percocet, Norco).  All of the other medicines listed below are available over the counter. X Aleve 2 pills twice a day for the first 3 days after surgery. X acetominophen (Tylenol) 650 mg every 4-6 hours as you need for minor to moderate pain X oxycodone as prescribed for severe pain  Narcotic pain medicine (ex. oxycodone, Percocet, Vicodin) will cause constipation.  To prevent this problem, take the following medicines while you are taking any pain medicine. X docusate sodium (Colace) 100 mg twice a day X senna (Senokot) 2 tablets twice a day  X To help prevent blood clots, take a baby aspirin (81 mg) twice a day for two weeks after surgery.  You should also get up every hour while you are awake to move around.    Weight Bearing X Do not bear any weight on the operated leg or foot.  Cast / Splint / Dressing X Keep your splint, cast or dressing clean and dry.  Dont put anything (coat hanger, pencil, etc) down inside of it.  If it gets damp, use a hair dryer on the cool setting to dry it.  If it gets soaked, call the office to schedule an appointment for a cast change.  After your dressing, cast or splint is removed; you may shower, but do not soak or scrub the wound.  Allow the water to run over it, and then gently pat it dry.  Swelling It is normal for you to have swelling where you had surgery.  To reduce swelling and pain, keep your toes above your nose for at least 3 days after surgery.  It may be necessary to keep your foot or leg elevated for several weeks.  If it hurts, it should be elevated.  Follow Up Call my office at [phone] when you are discharged from the hospital or surgery center to schedule an appointment to be seen two weeks after surgery.  Call my office at [phone] if  you develop a fever >101.5 F, nausea, vomiting, bleeding from the surgical site or severe pain.      No Tylenol before 6:15pm   Post Anesthesia Home Care Instructions  Activity: Get plenty of rest for the remainder of the day. A responsible individual must stay with you for 24 hours following the procedure.  For the next 24 hours, DO NOT: -Drive a car -Paediatric nurse -Drink alcoholic beverages -Take any medication unless instructed by your physician -Make any legal decisions or sign important papers.  Meals: Start with liquid foods such as gelatin or soup. Progress to regular foods as tolerated. Avoid greasy, spicy, heavy foods. If nausea and/or vomiting occur, drink only clear liquids until the nausea and/or vomiting subsides. Call your physician if vomiting continues.  Special Instructions/Symptoms: Your throat may feel dry or sore from the anesthesia or the breathing tube placed in your throat during surgery. If this causes discomfort, gargle with warm salt water. The discomfort should disappear within 24 hours.  If you had a scopolamine patch placed behind your ear for the management of post- operative nausea and/or vomiting:  1. The medication in the patch is effective for 72 hours, after which it should be removed.  Wrap patch in a tissue and discard in the trash. Wash hands thoroughly with soap and water. 2. You may remove the patch earlier  than 72 hours if you experience unpleasant side effects which may include dry mouth, dizziness or visual disturbances. 3. Avoid touching the patch. Wash your hands with soap and water after contact with the patch.     Regional Anesthesia Blocks  1. Numbness or the inability to move the "blocked" extremity may last from 3-48 hours after placement. The length of time depends on the medication injected and your individual response to the medication. If the numbness is not going away after 48 hours, call your surgeon.  2. The extremity  that is blocked will need to be protected until the numbness is gone and the  Strength has returned. Because you cannot feel it, you will need to take extra care to avoid injury. Because it may be weak, you may have difficulty moving it or using it. You may not know what position it is in without looking at it while the block is in effect.  3. For blocks in the legs and feet, returning to weight bearing and walking needs to be done carefully. You will need to wait until the numbness is entirely gone and the strength has returned. You should be able to move your leg and foot normally before you try and bear weight or walk. You will need someone to be with you when you first try to ensure you do not fall and possibly risk injury.  4. Bruising and tenderness at the needle site are common side effects and will resolve in a few days.  5. Persistent numbness or new problems with movement should be communicated to the surgeon or the [place] [phone] [place] [phone]).

## 2019-07-02 NOTE — Anesthesia Procedure Notes (Signed)
Procedure Name: Intubation Date/Time: 07/02/2019 10:17 AM Performed by: Signe Colt, CRNA Pre-anesthesia Checklist: Patient identified, Emergency Drugs available, Suction available and Patient being monitored Patient Re-evaluated:Patient Re-evaluated prior to induction Oxygen Delivery Method: Circle system utilized Preoxygenation: Pre-oxygenation with 100% oxygen Induction Type: IV induction Ventilation: Mask ventilation without difficulty Laryngoscope Size: Mac and 3 Grade View: Grade I Tube type: Oral Tube size: 7.0 mm Number of attempts: 1 Airway Equipment and Method: Stylet and Oral airway Placement Confirmation: ETT inserted through vocal cords under direct vision,  positive ETCO2 and breath sounds checked- equal and bilateral Secured at: 21 cm Tube secured with: Tape Dental Injury: Teeth and Oropharynx as per pre-operative assessment

## 2019-07-02 NOTE — H&P (Signed)
Natalie Hall is an 68 y.o. female.   Chief Complaint: Left heel pain HPI: The patient is a 68 year old female with a long history of left heel pain due to Achilles tendinitis.  She also has a tight heel cord and a prominent Haglund deformity.  She has failed nonoperative treatment including activity modification, oral anti-inflammatories, physical therapy and heel lifts.  She presents now for operative treatment of this painful and limiting condition.  Past Medical History:  Diagnosis Date  . Asthma    infrequent problem - no treatment since 2006  . Back pain   . Choroidal nevus of left eye    sees Dr. Gerarda Fraction at [facility].   . Constipation   . Coronary artery disease   . Dysrhythmia    SVT - ablation by Dr. Curt Bears on 05/21/2016 and episodes since  . Fatty liver   . GERD (gastroesophageal reflux disease)   . History of hiatal hernia    "very small"  . Hx of colonic polyp   . Hyperlipidemia   . Hypertension   . Joint pain   . Melanoma of eye, left ([place])    sees Dr. Cordelia Pen  . Myocardial infarction ([place])   . Osteoarthritis   . Shortness of breath   . Supraventricular tachycardia [facility])     Past Surgical History:  Procedure Laterality Date  . ABDOMINAL HYSTERECTOMY    . ACHILLES TENDON REPAIR  10-07-11   torn on right, per Dr. Noemi Chapel   . ANKLE SURGERY     torn tendon left ankle  . BREAST BIOPSY    . CARDIAC CATHETERIZATION N/A 03/14/2016   Procedure: Left Heart Cath and Coronary Angiography;  Surgeon: Peter M Martinique, MD;  Location: [place] [facility];  Service: Cardiovascular;  Laterality: N/A;  . CARDIAC CATHETERIZATION N/A 03/14/2016   Procedure: Coronary Stent Intervention;  Surgeon: Peter M Martinique, MD;  Location: [place] [facility];  Service: Cardiovascular;  Laterality: N/A;  . CARPAL TUNNEL RELEASE    . CATARACT EXTRACTION W/ INTRAOCULAR LENS IMPLANT Bilateral   . CHOLECYSTECTOMY    . COLONOSCOPY  06-23-14   per Dr. Ardis Hughs, adenomatous polyps, repeat in 5 yrs     . ELECTROPHYSIOLOGIC STUDY N/A 05/21/2016   Procedure: SVT Ablation;  Surgeon: [name], MD;  Location: [place] [facility];  Service: Cardiovascular;  Laterality: N/A;  . EYE SURGERY Left 08/2016  . IR KYPHO THORACIC WITH BONE BIOPSY  12/18/2018  . IR RADIOLOGIST EVAL & MGMT  12/09/2018  . IR RADIOLOGIST EVAL & MGMT  04/30/2019  . KNEE CLOSED REDUCTION Left 02/03/2014   Procedure: CLOSED MANIPULATION LEFT KNEE;  Surgeon: Gearlean Alf, MD;  Location: [facility];  Service: Orthopedics;  Laterality: Left;  . POLYPECTOMY    . REVERSE SHOULDER ARTHROPLASTY     Left  . REVERSE SHOULDER ARTHROPLASTY Left 06/28/2017   Procedure: LEFT SHOULDER REVERSE SHOULDER ARTHROPLASTY;  Surgeon: Netta Cedars, MD;  Location: [facility];  Service: Orthopedics;  Laterality: Left;  . TOTAL KNEE ARTHROPLASTY Left 12/21/2013   Procedure: LEFT TOTAL KNEE ARTHROPLASTY;  Surgeon: Gearlean Alf, MD;  Location: [facility];  Service: Orthopedics;  Laterality: Left;  Marland Kitchen VARICOSE VEIN SURGERY      Family History  Problem Relation Age of Onset  . Colon polyps Mother   . Diabetes Mother   . High blood pressure Mother   . High Cholesterol Mother   . Thyroid disease Mother   . Anxiety disorder Mother   .  Colon cancer Maternal Aunt   . Liver cancer Maternal Aunt   . Breast cancer Other   . Coronary artery disease Other   . Colon cancer Other   . Diabetes Other   . Hypertension Other   . Kidney disease Other   . Lung cancer Other   . Lung cancer Brother        spleen, bone  . Diabetes Father   . High blood pressure Father   . High Cholesterol Father   . Heart disease Father   . Depression Father   . Obesity Father   . Esophageal cancer Neg Hx   . Stomach cancer Neg Hx   . Rectal cancer Neg Hx    Social History:  reports that she has never smoked. She has never used smokeless tobacco. She reports that she does not drink alcohol or use drugs.  Allergies:  Allergies  Allergen Reactions  . Codeine Other (See  Comments), Shortness Of Breath and Rash    flushing flushing   . Levofloxacin In D5w Other (See Comments)    Leg pain  Leg pain   . Amoxicillin   . Amoxicillin-Pot Clavulanate Rash    Has patient had a PCN reaction causing immediate rash, facial/tongue/throat swelling, SOB or lightheadedness with hypotension: No Has patient had a PCN reaction causing severe rash involving mucus membranes or skin necrosis: No Has patient had a PCN reaction that required hospitalization: No Has patient had a PCN reaction occurring within the last 10 years: Yes If all of the above answers are "NO", then may proceed with Cephalosporin use.  Has patient had a PCN reaction causing immediate rash, facial/tongue/throat swelling, SOB or lightheadedness with hypotension: No Has patient had a PCN reaction causing severe rash involving mucus membranes or skin necrosis: No Has patient had a PCN reaction that required hospitalization: No Has patient had a PCN reaction occurring within the last 10 years: Yes If all of the above answers are "NO", then may proceed with Cephalosporin use.  . Escitalopram Oxalate Rash    Medications Prior to Admission  Medication Sig Dispense Refill  . acetaminophen (TYLENOL) 500 MG tablet Take 500 mg by mouth every 6 (six) hours as needed (pain).     Marland Kitchen aspirin EC 81 MG tablet Take 81 mg by mouth daily.    . Calcium Carb-Cholecalciferol (CALCIUM PLUS VITAMIN D3) 600-800 MG-UNIT TABS Take 2 tablets by mouth daily.     . cephALEXin (KEFLEX) 500 MG capsule Take 500 mg by mouth 2 (two) times daily.    . cetirizine (ZYRTEC) 10 MG tablet Take 10 mg by mouth daily as needed for allergies.    . Cholecalciferol (VITAMIN D3) 25 MCG (1000 UT) CAPS Take 1,000 Units by mouth daily.     . cyclobenzaprine (FLEXERIL) 10 MG tablet Take 1 tablet (10 mg total) by mouth 3 (three) times daily as needed for muscle spasms. 60 tablet 2  . diphenhydramine-acetaminophen (TYLENOL PM) 25-500 MG TABS tablet Take 2  tablets by mouth at bedtime as needed (for pain.).    Marland Kitchen ezetimibe (ZETIA) 10 MG tablet Take 1 tablet (10 mg total) by mouth daily. 90 tablet 3  . metoprolol succinate (TOPROL-XL) 25 MG 24 hr tablet Take 1 tablet (25 mg total) by mouth 2 (two) times daily. 180 tablet 3  . omeprazole (PRILOSEC) 40 MG capsule Take 1 capsule (40 mg total) by mouth daily. 90 capsule 3  . ramipril (ALTACE) 10 MG capsule Take 1 capsule (10 mg  total) by mouth daily. 90 capsule 3  . rosuvastatin (CRESTOR) 40 MG tablet Take 1 tablet (40 mg total) by mouth daily. 90 tablet 3  . temazepam (RESTORIL) 15 MG capsule Take 1 capsule (15 mg total) by mouth at bedtime as needed for sleep. 90 capsule 1  . triamcinolone (NASACORT) 55 MCG/ACT nasal inhaler Place 2 sprays into the nose daily. (Patient taking differently: Place 2 sprays into the nose daily as needed (allergies). ) 3 Inhaler 3  . nitroGLYCERIN (NITROSTAT) 0.4 MG SL tablet DISSOLVE 1 TABLET UNDER THE TONGUE EVERY 5 MINUTES FOR  3 DOSES AS NEEDED FOR CHEST PAIN (Patient taking differently: Place 0.4 mg under the tongue every 5 (five) minutes as needed for chest pain. ) 100 tablet 1    No results found for this or any previous visit (from the past 48 hour(s)). No results found.  ROS no recent fever, chills, nausea, vomiting or changes in her appetite  Blood pressure (!) 108/52, pulse (!) 59, resp. rate 16, height 5\' 4"  (1.626 m), weight 79.8 kg, SpO2 100 %. Physical Exam  Well-nourished well-developed woman in no apparent distress.  Alert and oriented x4.  Mood and affect are normal.  Extraocular motions are intact.  Respirations are unlabored.  Gait is antalgic to the left.  Left ankle has healthy skin.  Tender to palpation at the insertion of the Achilles.  Short Achilles.  5 out of 5 strength in plantarflexion.  No lymphadenopathy.  Palpable pulses in the foot.  Assessment/Plan Left Achilles insertional tendinitis, Haglund deformity and tight heel cord -to the operating  room today for gastrocnemius recession as well as excision of the Haglund deformity and debridement of the Achilles tendon with reconstruction.  The risks and benefits of the alternative treatment options have been discussed in detail.  The patient wishes to proceed with surgery and specifically understands risks of bleeding, infection, nerve damage, blood clots, need for additional surgery, amputation and death.   Wylene Simmer, MD 2019-07-04, 10:03 AM

## 2019-07-02 NOTE — Anesthesia Postprocedure Evaluation (Signed)
Anesthesia Post Note  Patient: Natalie Hall  Procedure(s) Performed: Gastroc recession (Left Leg Lower) Left Achilles tendon debridement and reconstruction and excision of Haglund deformity (Left Foot)     Patient location during evaluation: PACU Anesthesia Type: General Level of consciousness: awake and alert Pain management: pain level controlled Vital Signs Assessment: post-procedure vital signs reviewed and stable Respiratory status: spontaneous breathing, nonlabored ventilation, respiratory function stable and patient connected to nasal cannula oxygen Cardiovascular status: blood pressure returned to baseline and stable Postop Assessment: no apparent nausea or vomiting Anesthetic complications: no    Last Vitals:  Vitals:   07/02/19 1230 07/02/19 1307  BP:  (!) 127/57  Pulse: 75 73  Resp: 14 16  Temp:  36.4 C  SpO2: 100% 99%    Last Pain:  Vitals:   07/02/19 1307  TempSrc: Oral  PainSc: 0-No pain                 Barnet Glasgow

## 2019-07-02 NOTE — Transfer of Care (Signed)
Immediate Anesthesia Transfer of Care Note  Patient: Natalie Hall  Procedure(s) Performed: Gastroc recession (Left Leg Lower) Left Achilles tendon debridement and reconstruction and excision of Haglund deformity (Left Foot)  Patient Location: PACU  Anesthesia Type:GA combined with regional for post-op pain  Level of Consciousness: awake, alert , oriented and patient cooperative  Airway & Oxygen Therapy: Patient Spontanous Breathing and Patient connected to face mask oxygen  Post-op Assessment: Report given to RN and Post -op Vital signs reviewed and stable  Post vital signs: Reviewed and stable  Last Vitals:  Vitals Value Taken Time  BP    Temp    Pulse 81 07/02/19 1129  Resp    SpO2 98 % 07/02/19 1129  Vitals shown include unvalidated device data.  Last Pain: There were no vitals filed for this visit.       Complications: No apparent anesthesia complications

## 2019-07-02 NOTE — Progress Notes (Signed)
Assisted Dr. Valma Cava with left, ultrasound guided, popliteal block. Side rails up, monitors on throughout procedure. See vital signs in flow sheet. Tolerated Procedure well.

## 2019-07-03 ENCOUNTER — Encounter (HOSPITAL_BASED_OUTPATIENT_CLINIC_OR_DEPARTMENT_OTHER): Payer: Self-pay | Admitting: Orthopedic Surgery

## 2019-07-24 ENCOUNTER — Other Ambulatory Visit: Payer: Self-pay

## 2019-07-24 ENCOUNTER — Ambulatory Visit (INDEPENDENT_AMBULATORY_CARE_PROVIDER_SITE_OTHER): Payer: Medicare Other

## 2019-07-24 DIAGNOSIS — Z23 Encounter for immunization: Secondary | ICD-10-CM | POA: Diagnosis not present

## 2019-08-24 ENCOUNTER — Ambulatory Visit (INDEPENDENT_AMBULATORY_CARE_PROVIDER_SITE_OTHER): Payer: Medicare Other | Admitting: Family Medicine

## 2019-08-31 ENCOUNTER — Other Ambulatory Visit: Payer: Self-pay

## 2019-08-31 ENCOUNTER — Encounter (INDEPENDENT_AMBULATORY_CARE_PROVIDER_SITE_OTHER): Payer: Self-pay | Admitting: Family Medicine

## 2019-08-31 ENCOUNTER — Ambulatory Visit (INDEPENDENT_AMBULATORY_CARE_PROVIDER_SITE_OTHER): Payer: Medicare Other | Admitting: Family Medicine

## 2019-08-31 VITALS — BP 119/70 | HR 72 | Temp 98.3°F | Ht 64.0 in | Wt 182.0 lb

## 2019-08-31 DIAGNOSIS — Z6831 Body mass index (BMI) 31.0-31.9, adult: Secondary | ICD-10-CM | POA: Diagnosis not present

## 2019-08-31 DIAGNOSIS — R7303 Prediabetes: Secondary | ICD-10-CM

## 2019-08-31 DIAGNOSIS — E669 Obesity, unspecified: Secondary | ICD-10-CM | POA: Diagnosis not present

## 2019-09-01 NOTE — Progress Notes (Signed)
Office: [phone]  /  Fax: [phone]   HPI:   Chief Complaint: OBESITY Natalie Hall is here to discuss her progress with her obesity treatment plan. She is on the Category 2 plan and is following her eating plan approximately 60 % of the time. She states she is exercising 0 minutes 0 times per week. Natalie Hall had surgery 07/02/19 on her left achilles and she is also healing form a right middle toe fracture. She has been less active physically because of this. She admits to eating more to some extent. She is getting protein in. Natalie Hall plans on getting back on the plan. She has been having some regular cola recently. Her weight is 182 lb (82.6 kg) today and has had a weight gain of 6 pounds over a period of 10 weeks since her last visit. She has lost 31 lbs since starting treatment with [name].  Pre-Diabetes Natalie Hall has a diagnosis of prediabetes based on her elevated Hgb A1c and was informed this puts her at greater risk of developing diabetes. Natalie Hall is not on metformin currently and she denies polyphagia. She continues to work on diet and exercise to decrease risk of diabetes.  Lab Results  Component Value Date   HGBA1C 6.0 03/31/2019    ASSESSMENT AND PLAN:  Prediabetes  Class 1 obesity with serious comorbidity and body mass index (BMI) of 31.0 to 31.9 in adult, unspecified obesity type  PLAN:  Pre-Diabetes Natalie Hall will continue to work on weight loss, exercise, and decreasing simple carbohydrates in her diet to help decrease the risk of diabetes. Natalie Hall will continue with the meal plan and she will follow up with [name] as directed to monitor her progress.  Obesity Natalie Hall is currently in the action stage of change. As such, her goal is to continue with weight loss efforts She has agreed to follow the Category 2 plan We discussed the following Behavioral Modification Strategies today: planning for success, holiday eating strategies and decrease liquid calories  Natalie Hall has agreed to follow  up with our clinic in 3 weeks. She was informed of the importance of frequent follow up visits to maximize her success with intensive lifestyle modifications for her multiple health conditions.  ALLERGIES: Allergies  Allergen Reactions  . Codeine Other (See Comments), Shortness Of Breath and Rash    flushing flushing   . Levofloxacin In D5w Other (See Comments)    Leg pain  Leg pain   . Amoxicillin   . Amoxicillin-Pot Clavulanate Rash    Has patient had a PCN reaction causing immediate rash, facial/tongue/throat swelling, SOB or lightheadedness with hypotension: No Has patient had a PCN reaction causing severe rash involving mucus membranes or skin necrosis: No Has patient had a PCN reaction that required hospitalization: No Has patient had a PCN reaction occurring within the last 10 years: Yes If all of the above answers are "NO", then may proceed with Cephalosporin use.  Has patient had a PCN reaction causing immediate rash, facial/tongue/throat swelling, SOB or lightheadedness with hypotension: No Has patient had a PCN reaction causing severe rash involving mucus membranes or skin necrosis: No Has patient had a PCN reaction that required hospitalization: No Has patient had a PCN reaction occurring within the last 10 years: Yes If all of the above answers are "NO", then may proceed with Cephalosporin use.  . Escitalopram Oxalate Rash    MEDICATIONS: Current Outpatient Medications on File Prior to Visit  Medication Sig Dispense Refill  . acetaminophen (TYLENOL) 500 MG tablet Take  500 mg by mouth every 6 (six) hours as needed (pain).     Marland Kitchen aspirin EC 81 MG tablet Take 81 mg by mouth daily.    . Calcium Carb-Cholecalciferol (CALCIUM PLUS VITAMIN D3) 600-800 MG-UNIT TABS Take 2 tablets by mouth daily.     . cetirizine (ZYRTEC) 10 MG tablet Take 10 mg by mouth daily as needed for allergies.    . Cholecalciferol (VITAMIN D3) 25 MCG (1000 UT) CAPS Take 1,000 Units by mouth daily.     .  cyclobenzaprine (FLEXERIL) 10 MG tablet Take 1 tablet (10 mg total) by mouth 3 (three) times daily as needed for muscle spasms. 60 tablet 2  . diphenhydramine-acetaminophen (TYLENOL PM) 25-500 MG TABS tablet Take 2 tablets by mouth at bedtime as needed (for pain.).    Marland Kitchen ezetimibe (ZETIA) 10 MG tablet Take 1 tablet (10 mg total) by mouth daily. 90 tablet 3  . metoprolol succinate (TOPROL-XL) 25 MG 24 hr tablet Take 1 tablet (25 mg total) by mouth 2 (two) times daily. 180 tablet 3  . nitroGLYCERIN (NITROSTAT) 0.4 MG SL tablet DISSOLVE 1 TABLET UNDER THE TONGUE EVERY 5 MINUTES FOR  3 DOSES AS NEEDED FOR CHEST PAIN (Patient taking differently: Place 0.4 mg under the tongue every 5 (five) minutes as needed for chest pain. ) 100 tablet 1  . omeprazole (PRILOSEC) 40 MG capsule Take 1 capsule (40 mg total) by mouth daily. 90 capsule 3  . oxyCODONE (ROXICODONE) 5 MG immediate release tablet Take 1 tablet (5 mg total) by mouth every 6 (six) hours as needed for severe pain. 20 tablet 0  . ramipril (ALTACE) 10 MG capsule Take 1 capsule (10 mg total) by mouth daily. 90 capsule 3  . rosuvastatin (CRESTOR) 40 MG tablet Take 1 tablet (40 mg total) by mouth daily. 90 tablet 3  . temazepam (RESTORIL) 15 MG capsule Take 1 capsule (15 mg total) by mouth at bedtime as needed for sleep. 90 capsule 1  . triamcinolone (NASACORT) 55 MCG/ACT nasal inhaler Place 2 sprays into the nose daily. (Patient taking differently: Place 2 sprays into the nose daily as needed (allergies). ) 3 Inhaler 3   No current facility-administered medications on file prior to visit.     PAST MEDICAL HISTORY: Past Medical History:  Diagnosis Date  . Asthma    infrequent problem - no treatment since 2006  . Back pain   . Choroidal nevus of left eye    sees Dr. Gerarda Fraction at [facility].   . Constipation   . Coronary artery disease   . Dysrhythmia    SVT - ablation by Dr. Curt Bears on 05/21/2016 and episodes since  . Fatty liver   .  GERD (gastroesophageal reflux disease)   . History of hiatal hernia    "very small"  . Hx of colonic polyp   . Hyperlipidemia   . Hypertension   . Joint pain   . Melanoma of eye, left ([place])    sees Dr. Cordelia Pen  . Myocardial infarction ([place])   . Osteoarthritis   . Shortness of breath   . Supraventricular tachycardia ([place])     PAST SURGICAL HISTORY: Past Surgical History:  Procedure Laterality Date  . ABDOMINAL HYSTERECTOMY    . ACHILLES TENDON REPAIR  10-07-11   torn on right, per Dr. Noemi Chapel   . ANKLE SURGERY     torn tendon left ankle  . BREAST BIOPSY    . CARDIAC CATHETERIZATION N/A 03/14/2016   Procedure:  Left Heart Cath and Coronary Angiography;  Surgeon: Peter M Martinique, MD;  Location: [place] [facility];  Service: Cardiovascular;  Laterality: N/A;  . CARDIAC CATHETERIZATION N/A 03/14/2016   Procedure: Coronary Stent Intervention;  Surgeon: Peter M Martinique, MD;  Location: [place] [facility];  Service: Cardiovascular;  Laterality: N/A;  . CARPAL TUNNEL RELEASE    . CATARACT EXTRACTION W/ INTRAOCULAR LENS IMPLANT Bilateral   . CHOLECYSTECTOMY    . COLONOSCOPY  06-23-14   per Dr. Ardis Hughs, adenomatous polyps, repeat in 5 yrs    . ELECTROPHYSIOLOGIC STUDY N/A 05/21/2016   Procedure: SVT Ablation;  Surgeon: [name], MD;  Location: [place] [facility];  Service: Cardiovascular;  Laterality: N/A;  . 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 EYE SURGERY Left 08/2016  . GASTROCNEMIUS RECESSION Left 07/02/2019   Procedure: Gastroc recession;  Surgeon: Wylene Simmer, MD;  Location: [place];  Service: Orthopedics;  Laterality: Left;  . IR KYPHO THORACIC WITH BONE BIOPSY  12/18/2018  . IR RADIOLOGIST EVAL & MGMT  12/09/2018  . IR  RADIOLOGIST EVAL & MGMT  04/30/2019  . KNEE CLOSED REDUCTION Left 02/03/2014   Procedure: CLOSED MANIPULATION LEFT KNEE;  Surgeon: Gearlean Alf, MD;  Location: [facility];  Service: Orthopedics;  Laterality: Left;  . POLYPECTOMY    . REVERSE SHOULDER ARTHROPLASTY     Left  . REVERSE SHOULDER ARTHROPLASTY Left 06/28/2017   Procedure: LEFT SHOULDER REVERSE SHOULDER ARTHROPLASTY;  Surgeon: Netta Cedars, MD;  Location: [place];  Service: Orthopedics;  Laterality: Left;  . TOTAL KNEE ARTHROPLASTY Left 12/21/2013   Procedure: LEFT TOTAL KNEE ARTHROPLASTY;  Surgeon: Gearlean Alf, MD;  Location: [facility];  Service: Orthopedics;  Laterality: Left;  Marland Kitchen VARICOSE VEIN SURGERY      SOCIAL HISTORY: Social History   Tobacco Use  . Smoking status: Never Smoker  . Smokeless tobacco: Never Used  Substance Use Topics  . Alcohol use: No    Alcohol/week: 0.0 standard drinks  . Drug use: No    FAMILY HISTORY: Family History  Problem Relation Age of Onset  . Colon polyps Mother   . Diabetes Mother   . High blood pressure Mother   . High Cholesterol Mother   . Thyroid disease Mother   . Anxiety disorder Mother   . Colon cancer Maternal Aunt   . Liver cancer Maternal Aunt   . Breast cancer Other   . Coronary artery disease Other   . Colon cancer Other   . Diabetes Other   . Hypertension Other   . Kidney disease Other   . Lung cancer Other   . Lung cancer Brother        spleen, bone  . Diabetes Father   . High blood pressure Father   . High Cholesterol Father   . Heart disease Father   . Depression Father   . Obesity Father   . Esophageal cancer Neg Hx   . Stomach cancer Neg Hx   . Rectal cancer Neg Hx     ROS: Review of Systems  Constitutional: Negative for weight loss.  Endo/Heme/Allergies:       Negative for polyphagia    PHYSICAL EXAM: Blood pressure 119/70, pulse 72, temperature 98.3 F (36.8 C), temperature source Oral, height  5\' 4"  (1.626 m), weight 182 lb (82.6 kg), SpO2 99 %.  Body mass index is 31.24 kg/m. Physical Exam Vitals signs reviewed.  Constitutional:      Appearance: Normal appearance. She is well-developed. She is obese.  Cardiovascular:     Rate and Rhythm: Normal rate.  Pulmonary:     Effort: Pulmonary effort is normal.  Musculoskeletal: Normal range of motion.  Skin:    General: Skin is warm and dry.  Neurological:     Mental Status: She is alert and oriented to person, place, and time.     Comments: + Using cane. Wooden shoe on right foot.  Psychiatric:        Mood and Affect: Mood normal.        Behavior: Behavior normal.     RECENT LABS AND TESTS: BMET    Component Value Date/Time   NA 132 (L) 03/31/2019 0934   NA 135 11/20/2018 1413   K 4.3 03/31/2019 0934   CL 97 03/31/2019 0934   CO2 28 03/31/2019 0934   GLUCOSE 78 03/31/2019 0934   BUN 20 03/31/2019 0934   BUN 21 11/20/2018 1413   CREATININE 0.61 03/31/2019 0934   CREATININE 0.59 10/01/2016 0939   CALCIUM 9.2 03/31/2019 0934   GFRNONAA >60 12/18/2018 0857   GFRAA >60 12/18/2018 0857   Lab Results  Component Value Date   HGBA1C 6.0 03/31/2019   HGBA1C 5.7 (H) 11/20/2018   HGBA1C 5.9 (H) 08/04/2018   Lab Results  Component Value Date   INSULIN 15.4 11/20/2018   INSULIN 33.5 (H) 08/04/2018   CBC    Component Value Date/Time   WBC 5.1 03/31/2019 0934   RBC 4.47 03/31/2019 0934   HGB 12.9 03/31/2019 0934   HCT 38.5 03/31/2019 0934   PLT 211.0 03/31/2019 0934   MCV 86.2 03/31/2019 0934   MCH 26.6 12/18/2018 0857   MCHC 33.5 03/31/2019 0934   RDW 14.2 03/31/2019 0934   LYMPHSABS 1.6 03/31/2019 0934   MONOABS 0.5 03/31/2019 0934   EOSABS 0.3 03/31/2019 0934   BASOSABS 0.0 03/31/2019 0934   Iron/TIBC/Ferritin/ %Sat No results found for: IRON, TIBC, FERRITIN, IRONPCTSAT Lipid Panel     Component Value Date/Time   CHOL 105 03/31/2019 0934   CHOL 99 (L) 09/30/2018 1108   TRIG 148.0 03/31/2019 0934   HDL 26.20 (L) 03/31/2019 0934   HDL 28 (L) 09/30/2018  1108   CHOLHDL 4 03/31/2019 0934   VLDL 29.6 03/31/2019 0934   LDLCALC 49 03/31/2019 0934   LDLCALC 45 09/30/2018 1108   LDLDIRECT 96.0 03/11/2018 0904   Hepatic Function Panel     Component Value Date/Time   PROT 7.0 03/31/2019 0934   PROT 7.3 11/20/2018 1413   ALBUMIN 4.0 03/31/2019 0934   ALBUMIN 4.0 11/20/2018 1413   AST 21 03/31/2019 0934   ALT 18 03/31/2019 0934   ALKPHOS 47 03/31/2019 0934   BILITOT 0.4 03/31/2019 0934   BILITOT 0.3 11/20/2018 1413   BILIDIR 0.1 03/31/2019 0934   IBILI 0.3 07/09/2016 0905      Component Value Date/Time   TSH 1.78 03/31/2019 0934   TSH 1.120 08/04/2018 0921   TSH 1.49 03/11/2018 0904     Ref. Range 11/20/2018 14:13  Vitamin D, 25-Hydroxy Latest Ref Range: 30.0 - 100.0 ng/mL 75.9    OBESITY BEHAVIORAL INTERVENTION VISIT  Today's visit was # 18   Starting weight: 213 lbs Starting date: 08/04/2018 Today's weight : 182 lbs Today's date: 08/31/2019 Total lbs  lost to date: 31    08/31/2019  Height 5\' 4"  (1.626 m)  Weight 182 lb (82.6 kg)  BMI (Calculated) 31.22  BLOOD PRESSURE - SYSTOLIC 123456  BLOOD PRESSURE - DIASTOLIC 70   Body Fat % 123456 %  Total Body Water (lbs) 70 lbs    ASK: We discussed the diagnosis of obesity with Natalie Hall today and Natalie Hall agreed to give [name] permission to discuss obesity behavioral modification therapy today.  ASSESS: Daisie has the diagnosis of obesity and her BMI today is 31.22 Natalie Hall is in the action stage of change   ADVISE: Natalie Hall was educated on the multiple health risks of obesity as well as the benefit of weight loss to improve her health. She was advised of the need for long term treatment and the importance of lifestyle modifications to improve her current health and to decrease her risk of future health problems.  AGREE: Multiple dietary modification options and treatment options were discussed and  Natalie Hall agreed to follow the recommendations documented in the above note.  ARRANGE:  Natalie Hall was educated on the importance of frequent visits to treat obesity as outlined per CMS and USPSTF guidelines and agreed to schedule her next follow up appointment today.  I, Doreene Nest, am acting as transcriptionist for [employer], FNP-C  I have reviewed the above documentation for accuracy and completeness, and I agree with the above.  - Diamante Rubin, FNP-C.

## 2019-09-14 ENCOUNTER — Encounter: Payer: Self-pay | Admitting: Family Medicine

## 2019-09-21 MED ORDER — TEMAZEPAM 15 MG PO CAPS: 15.0000 mg | ORAL_CAPSULE | Freq: Every evening | ORAL | 1 refills | Status: DC | PRN

## 2019-09-24 ENCOUNTER — Encounter (INDEPENDENT_AMBULATORY_CARE_PROVIDER_SITE_OTHER): Payer: Self-pay | Admitting: Family Medicine

## 2019-09-24 ENCOUNTER — Ambulatory Visit (INDEPENDENT_AMBULATORY_CARE_PROVIDER_SITE_OTHER): Payer: Medicare Other | Admitting: Family Medicine

## 2019-09-24 ENCOUNTER — Other Ambulatory Visit: Payer: Self-pay

## 2019-09-24 VITALS — BP 132/72 | HR 70 | Temp 97.8°F | Ht 64.0 in | Wt 179.0 lb

## 2019-09-24 DIAGNOSIS — R7303 Prediabetes: Secondary | ICD-10-CM | POA: Diagnosis not present

## 2019-09-24 DIAGNOSIS — E669 Obesity, unspecified: Secondary | ICD-10-CM

## 2019-09-24 DIAGNOSIS — Z6830 Body mass index (BMI) 30.0-30.9, adult: Secondary | ICD-10-CM | POA: Diagnosis not present

## 2019-09-28 NOTE — Progress Notes (Signed)
Office: [phone]  /  Fax: [phone]   HPI:   Chief Complaint: OBESITY Natalie Hall is here to discuss her progress with her obesity treatment plan. She is on the Category 2 plan and is following her eating plan approximately 80 % of the time. She states she is exercising 0 minutes 0 times per week. Natalie Hall was off the plan at Thanksgiving, but she is now back on the plan. She is doing quite well with the plan. Natalie Hall has lost 3 of the 6 pounds she had gained after her foot surgery. Her weight is 179 lb (81.2 kg) today and has had a weight loss of 3 pounds over a period of 3 to 4 weeks since her last visit. She has lost 34 lbs since starting treatment with [name].  Pre-Diabetes Natalie Hall has a diagnosis of prediabetes based on her elevated Hgb A1c and was informed this puts her at greater risk of developing diabetes. Natalie Hall is not taking metformin currently and she continues to work on diet and exercise to decrease risk of diabetes. She denies polyphagia. Lab Results  Component Value Date   HGBA1C 6.0 03/31/2019   ASSESSMENT AND PLAN:  Prediabetes  Class 1 obesity with serious comorbidity and body mass index (BMI) of 30.0 to 30.9 in adult, unspecified obesity type  PLAN:  Pre-Diabetes Natalie Hall will continue to work on weight loss, exercise, and decreasing simple carbohydrates in her diet to help decrease the risk of diabetes.  Natalie Hall will continue with the plan and she will follow up with [name] as directed to monitor her progress.  Obesity Natalie Hall is currently in the action stage of change. As such, her goal is to continue with weight loss efforts She has agreed to follow the Category 2 plan  We discussed the following Behavioral Modification Strategies today: planning for success, better snacking choices and increasing lean protein intake  We discussed possibly buying an exercise bike.  Natalie Hall has agreed to follow up with our clinic in 2 to 3 weeks. She was informed of the importance of  frequent follow up visits to maximize her success with intensive lifestyle modifications for her multiple health conditions.  ALLERGIES: Allergies  Allergen Reactions  . Codeine Other (See Comments), Shortness Of Breath and Rash    flushing flushing   . Levofloxacin In D5w Other (See Comments)    Leg pain  Leg pain   . Amoxicillin   . Amoxicillin-Pot Clavulanate Rash    Has patient had a PCN reaction causing immediate rash, facial/tongue/throat swelling, SOB or lightheadedness with hypotension: No Has patient had a PCN reaction causing severe rash involving mucus membranes or skin necrosis: No Has patient had a PCN reaction that required hospitalization: No Has patient had a PCN reaction occurring within the last 10 years: Yes If all of the above answers are "NO", then may proceed with Cephalosporin use.  Has patient had a PCN reaction causing immediate rash, facial/tongue/throat swelling, SOB or lightheadedness with hypotension: No Has patient had a PCN reaction causing severe rash involving mucus membranes or skin necrosis: No Has patient had a PCN reaction that required hospitalization: No Has patient had a PCN reaction occurring within the last 10 years: Yes If all of the above answers are "NO", then may proceed with Cephalosporin use.  . Escitalopram Oxalate Rash    MEDICATIONS: Current Outpatient Medications on File Prior to Visit  Medication Sig Dispense Refill  . acetaminophen (TYLENOL) 500 MG tablet Take 500 mg by mouth every 6 (six) hours  as needed (pain).     Marland Kitchen aspirin EC 81 MG tablet Take 81 mg by mouth daily.    . Calcium Carb-Cholecalciferol (CALCIUM PLUS VITAMIN D3) 600-800 MG-UNIT TABS Take 2 tablets by mouth daily.     . cetirizine (ZYRTEC) 10 MG tablet Take 10 mg by mouth daily as needed for allergies.    . Cholecalciferol (VITAMIN D3) 25 MCG (1000 UT) CAPS Take 1,000 Units by mouth daily.     . cyclobenzaprine (FLEXERIL) 10 MG tablet Take 1 tablet (10 mg total)  by mouth 3 (three) times daily as needed for muscle spasms. 60 tablet 2  . diphenhydramine-acetaminophen (TYLENOL PM) 25-500 MG TABS tablet Take 2 tablets by mouth at bedtime as needed (for pain.).    Marland Kitchen ezetimibe (ZETIA) 10 MG tablet Take 1 tablet (10 mg total) by mouth daily. 90 tablet 3  . metoprolol succinate (TOPROL-XL) 25 MG 24 hr tablet Take 1 tablet (25 mg total) by mouth 2 (two) times daily. 180 tablet 3  . nitroGLYCERIN (NITROSTAT) 0.4 MG SL tablet DISSOLVE 1 TABLET UNDER THE TONGUE EVERY 5 MINUTES FOR  3 DOSES AS NEEDED FOR CHEST PAIN (Patient taking differently: Place 0.4 mg under the tongue every 5 (five) minutes as needed for chest pain. ) 100 tablet 1  . omeprazole (PRILOSEC) 40 MG capsule Take 1 capsule (40 mg total) by mouth daily. 90 capsule 3  . oxyCODONE (ROXICODONE) 5 MG immediate release tablet Take 1 tablet (5 mg total) by mouth every 6 (six) hours as needed for severe pain. 20 tablet 0  . ramipril (ALTACE) 10 MG capsule Take 1 capsule (10 mg total) by mouth daily. 90 capsule 3  . rosuvastatin (CRESTOR) 40 MG tablet Take 1 tablet (40 mg total) by mouth daily. 90 tablet 3  . temazepam (RESTORIL) 15 MG capsule Take 1 capsule (15 mg total) by mouth at bedtime as needed for sleep. 90 capsule 1  . triamcinolone (NASACORT) 55 MCG/ACT nasal inhaler Place 2 sprays into the nose daily. (Patient taking differently: Place 2 sprays into the nose daily as needed (allergies). ) 3 Inhaler 3   No current facility-administered medications on file prior to visit.     PAST MEDICAL HISTORY: Past Medical History:  Diagnosis Date  . Asthma    infrequent problem - no treatment since 2006  . Back pain   . Choroidal nevus of left eye    sees Dr. Gerarda Fraction at [facility].   . Constipation   . Coronary artery disease   . Dysrhythmia    SVT - ablation by Dr. Curt Bears on 05/21/2016 and episodes since  . Fatty liver   . GERD (gastroesophageal reflux disease)   . History of hiatal hernia     "very small"  . Hx of colonic polyp   . Hyperlipidemia   . Hypertension   . Joint pain   . Melanoma of eye, left ([place])    sees Dr. Cordelia Pen  . Myocardial infarction ([place])   . Osteoarthritis   . Shortness of breath   . Supraventricular tachycardia ([place])     PAST SURGICAL HISTORY: Past Surgical History:  Procedure Laterality Date  . ABDOMINAL HYSTERECTOMY    . ACHILLES TENDON REPAIR  10-07-11   torn on right, per Dr. Noemi Chapel   . ANKLE SURGERY     torn tendon left ankle  . BREAST BIOPSY    . CARDIAC CATHETERIZATION N/A 03/14/2016   Procedure: Left Heart Cath and Coronary Angiography;  Surgeon:  Peter M Martinique, MD;  Location: [facility];  Service: Cardiovascular;  Laterality: N/A;  . CARDIAC CATHETERIZATION N/A 03/14/2016   Procedure: Coronary Stent Intervention;  Surgeon: Peter M Martinique, MD;  Location: [place] [facility];  Service: Cardiovascular;  Laterality: N/A;  . CARPAL TUNNEL RELEASE    . CATARACT EXTRACTION W/ INTRAOCULAR LENS IMPLANT Bilateral   . CHOLECYSTECTOMY    . COLONOSCOPY  06-23-14   per Dr. Ardis Hughs, adenomatous polyps, repeat in 5 yrs    . ELECTROPHYSIOLOGIC STUDY N/A 05/21/2016   Procedure: SVT Ablation;  Surgeon: [name], MD;  Location: [facility];  Service: Cardiovascular;  Laterality: N/A;  . EXCISION HAGLUND'S DEFORMITY WITH ACHILLES TENDON REPAIR Left 07/02/2019   Procedure: Left Achilles tendon debridement and reconstruction and excision of Haglund deformity;  Surgeon: Wylene Simmer, MD;  Location: [place];  Service: Orthopedics;  Laterality: Left;  . EYE SURGERY Left 08/2016  . GASTROCNEMIUS RECESSION Left 07/02/2019   Procedure: Gastroc recession;  Surgeon: Wylene Simmer, MD;  Location: [place];  Service: Orthopedics;  Laterality: Left;  . IR KYPHO THORACIC WITH BONE BIOPSY  12/18/2018  . IR RADIOLOGIST EVAL & MGMT  12/09/2018  . IR RADIOLOGIST EVAL & MGMT  04/30/2019  . KNEE CLOSED REDUCTION Left  02/03/2014   Procedure: CLOSED MANIPULATION LEFT KNEE;  Surgeon: Gearlean Alf, MD;  Location: [facility];  Service: Orthopedics;  Laterality: Left;  . POLYPECTOMY    . REVERSE SHOULDER ARTHROPLASTY     Left  . REVERSE SHOULDER ARTHROPLASTY Left 06/28/2017   Procedure: LEFT SHOULDER REVERSE SHOULDER ARTHROPLASTY;  Surgeon: Netta Cedars, MD;  Location: [place];  Service: Orthopedics;  Laterality: Left;  . TOTAL KNEE ARTHROPLASTY Left 12/21/2013   Procedure: LEFT TOTAL KNEE ARTHROPLASTY;  Surgeon: Gearlean Alf, MD;  Location: [facility];  Service: Orthopedics;  Laterality: Left;  Marland Kitchen VARICOSE VEIN SURGERY      SOCIAL HISTORY: Social History   Tobacco Use  . Smoking status: Never Smoker  . Smokeless tobacco: Never Used  Substance Use Topics  . Alcohol use: No    Alcohol/week: 0.0 standard drinks  . Drug use: No    FAMILY HISTORY: Family History  Problem Relation Age of Onset  . Colon polyps Mother   . Diabetes Mother   . High blood pressure Mother   . High Cholesterol Mother   . Thyroid disease Mother   . Anxiety disorder Mother   . Colon cancer Maternal Aunt   . Liver cancer Maternal Aunt   . Breast cancer Other   . Coronary artery disease Other   . Colon cancer Other   . Diabetes Other   . Hypertension Other   . Kidney disease Other   . Lung cancer Other   . Lung cancer Brother        spleen, bone  . Diabetes Father   . High blood pressure Father   . High Cholesterol Father   . Heart disease Father   . Depression Father   . Obesity Father   . Esophageal cancer Neg Hx   . Stomach cancer Neg Hx   . Rectal cancer Neg Hx     ROS: Review of Systems  Constitutional: Positive for weight loss.  Endo/Heme/Allergies:       Negative for polyphagia    PHYSICAL EXAM: Blood pressure 132/72, pulse 70, temperature 97.8 F (36.6 C), temperature source Oral, height 5\' 4"  (1.626 m), weight 179 lb (81.2  kg), SpO2 99 %. Body mass index is 30.73 kg/m. Physical Exam Vitals signs  reviewed.  Constitutional:      Appearance: Normal appearance. She is well-developed. She is obese.  Cardiovascular:     Rate and Rhythm: Normal rate.  Pulmonary:     Effort: Pulmonary effort is normal.  Musculoskeletal: Normal range of motion.  Skin:    General: Skin is warm and dry.  Neurological:     Mental Status: She is alert and oriented to person, place, and time.  Psychiatric:        Mood and Affect: Mood normal.        Behavior: Behavior normal.     RECENT LABS AND TESTS: BMET    Component Value Date/Time   NA 132 (L) 03/31/2019 0934   NA 135 11/20/2018 1413   K 4.3 03/31/2019 0934   CL 97 03/31/2019 0934   CO2 28 03/31/2019 0934   GLUCOSE 78 03/31/2019 0934   BUN 20 03/31/2019 0934   BUN 21 11/20/2018 1413   CREATININE 0.61 03/31/2019 0934   CREATININE 0.59 10/01/2016 0939   CALCIUM 9.2 03/31/2019 0934   GFRNONAA >60 12/18/2018 0857   GFRAA >60 12/18/2018 0857   Lab Results  Component Value Date   HGBA1C 6.0 03/31/2019   HGBA1C 5.7 (H) 11/20/2018   HGBA1C 5.9 (H) 08/04/2018   Lab Results  Component Value Date   INSULIN 15.4 11/20/2018   INSULIN 33.5 (H) 08/04/2018   CBC    Component Value Date/Time   WBC 5.1 03/31/2019 0934   RBC 4.47 03/31/2019 0934   HGB 12.9 03/31/2019 0934   HCT 38.5 03/31/2019 0934   PLT 211.0 03/31/2019 0934   MCV 86.2 03/31/2019 0934   MCH 26.6 12/18/2018 0857   MCHC 33.5 03/31/2019 0934   RDW 14.2 03/31/2019 0934   LYMPHSABS 1.6 03/31/2019 0934   MONOABS 0.5 03/31/2019 0934   EOSABS 0.3 03/31/2019 0934   BASOSABS 0.0 03/31/2019 0934   Iron/TIBC/Ferritin/ %Sat No results found for: IRON, TIBC, FERRITIN, IRONPCTSAT Lipid Panel     Component Value Date/Time   CHOL 105 03/31/2019 0934   CHOL 99 (L) 09/30/2018 1108   TRIG 148.0 03/31/2019 0934   HDL 26.20 (L) 03/31/2019 0934   HDL 28 (L) 09/30/2018 1108   CHOLHDL 4 03/31/2019 0934   VLDL 29.6 03/31/2019 0934   LDLCALC 49 03/31/2019 0934   LDLCALC 45 09/30/2018  1108   LDLDIRECT 96.0 03/11/2018 0904   Hepatic Function Panel     Component Value Date/Time   PROT 7.0 03/31/2019 0934   PROT 7.3 11/20/2018 1413   ALBUMIN 4.0 03/31/2019 0934   ALBUMIN 4.0 11/20/2018 1413   AST 21 03/31/2019 0934   ALT 18 03/31/2019 0934   ALKPHOS 47 03/31/2019 0934   BILITOT 0.4 03/31/2019 0934   BILITOT 0.3 11/20/2018 1413   BILIDIR 0.1 03/31/2019 0934   IBILI 0.3 07/09/2016 0905      Component Value Date/Time   TSH 1.78 03/31/2019 0934   TSH 1.120 08/04/2018 0921   TSH 1.49 03/11/2018 0904     Ref. Range 11/20/2018 14:13  Vitamin D, 25-Hydroxy Latest Ref Range: 30.0 - 100.0 ng/mL 75.9    OBESITY BEHAVIORAL INTERVENTION VISIT  Today's visit was # 19   Starting weight: 213 lbs Starting date: 08/04/2018 Today's weight : 179 lbs Today's date: 09/24/2019 Total lbs lost to date: 34    09/24/2019  Height 5\' 4"  (1.626 m)  Weight 179 lb (81.2 kg)  BMI (Calculated) 30.71  BLOOD PRESSURE - SYSTOLIC [id number]  BLOOD PRESSURE - DIASTOLIC 72   Body Fat % [id number] %  Total Body Water (lbs) 68 lbs    ASK: We discussed the diagnosis of obesity with [name] today and Margean agreed to give [name] permission to discuss obesity behavioral modification therapy today.  ASSESS: Rocelia has the diagnosis of obesity and her BMI today is 30.71 Jaquita is in the action stage of change   ADVISE: Basya was educated on the multiple health risks of obesity as well as the benefit of weight loss to improve her health. She was advised of the need for long term treatment and the importance of lifestyle modifications to improve her current health and to decrease her risk of future health problems.  AGREE: Multiple dietary modification options and treatment options were discussed and  Arpita agreed to follow the recommendations documented in the above note.  ARRANGE: Jenine was educated on the importance of frequent visits to treat obesity as outlined per CMS and USPSTF guidelines  and agreed to schedule her next follow up appointment today.  I, Doreene Nest, am acting as transcriptionist for [employer], FNP-C  I have reviewed the above documentation for accuracy and completeness, and I agree with the above.  - Michal Callicott, FNP-C.

## 2019-09-29 ENCOUNTER — Encounter (INDEPENDENT_AMBULATORY_CARE_PROVIDER_SITE_OTHER): Payer: Self-pay | Admitting: Family Medicine

## 2019-10-08 ENCOUNTER — Other Ambulatory Visit: Payer: Self-pay

## 2019-10-08 ENCOUNTER — Encounter (INDEPENDENT_AMBULATORY_CARE_PROVIDER_SITE_OTHER): Payer: Self-pay | Admitting: Bariatrics

## 2019-10-08 ENCOUNTER — Ambulatory Visit (INDEPENDENT_AMBULATORY_CARE_PROVIDER_SITE_OTHER): Payer: Medicare Other | Admitting: Bariatrics

## 2019-10-08 VITALS — BP 135/77 | HR 69 | Temp 98.0°F | Ht 64.0 in | Wt 177.0 lb

## 2019-10-08 DIAGNOSIS — R7303 Prediabetes: Secondary | ICD-10-CM | POA: Diagnosis not present

## 2019-10-08 DIAGNOSIS — E669 Obesity, unspecified: Secondary | ICD-10-CM

## 2019-10-08 DIAGNOSIS — Z6830 Body mass index (BMI) 30.0-30.9, adult: Secondary | ICD-10-CM | POA: Diagnosis not present

## 2019-10-08 DIAGNOSIS — I10 Essential (primary) hypertension: Secondary | ICD-10-CM | POA: Diagnosis not present

## 2019-10-08 DIAGNOSIS — E66811 Obesity, class 1: Secondary | ICD-10-CM

## 2019-10-12 ENCOUNTER — Other Ambulatory Visit: Payer: Self-pay | Admitting: Internal Medicine

## 2019-10-12 ENCOUNTER — Encounter (INDEPENDENT_AMBULATORY_CARE_PROVIDER_SITE_OTHER): Payer: Self-pay | Admitting: Bariatrics

## 2019-10-12 DIAGNOSIS — E782 Mixed hyperlipidemia: Secondary | ICD-10-CM

## 2019-10-12 NOTE — Progress Notes (Signed)
Office: [phone]  /  Fax: [phone]   HPI:  Chief Complaint: OBESITY Antonique is here to discuss her progress with her obesity treatment plan. She is on the Category 2 plan and states she is following her eating plan approximately 80-85% of the time. She states she is walking/PT 30 minutes 2-3 times per week.  Emalene is down 2 lbs and doing well overall. She denies any major cravings and is doing well with her appetite and protein.  Today's visit was #20 Starting weight: 213 lbs Starting date: 08/04/2018 Today's weight: 177 lbs  Today's date: 10/08/2019 Total lbs lost to date: 36 Total lbs lost since last in-office visit: 2   Hypertension Raiah has a diagnosis of hypertension, which is controlled.  Prediabetes Venona has a diagnosis of prediabetes. No polyphagia. Last A1c 6.0 on 03/31/2019 with an insulin of 13.6.  ASSESSMENT AND PLAN:  Essential hypertension  Prediabetes  Class 1 obesity with serious comorbidity and body mass index (BMI) of 30.0 to 30.9 in adult, unspecified obesity type  PLAN:  Hypertension Janeah is working on healthy weight loss and exercise to improve blood pressure control. She will continue her current medications. We will watch for signs of hypotension as she continues her lifestyle modifications.  Pre-Diabetes Brithney will continue to work on weight loss, exercise, decrease carbohydrates, and increase healthy fats and protein to help decrease the risk of diabetes.   Obesity Chrysten is currently in the action stage of change. As such, her goal is to continue with weight loss efforts. She has agreed to follow the Category 2 plan. Loyce will work on meal planning, intentional eating, increasing protein, and will be more adherent to the plan. Kanari has been instructed to continue PT/walking for weight loss and overall health benefits. We discussed the following Behavioral Modification Strategies today: increasing lean protein intake,  decreasing simple carbohydrates, increasing vegetables, increase H20 intake, decrease eating out, no skipping meals, work on meal planning and easy cooking plans, keeping healthy foods in the home, and planning for success.  Chauntae has agreed to follow-up with our clinic in 3 weeks. She was informed of the importance of frequent follow-up visits to maximize her success with intensive lifestyle modifications for her multiple health conditions.  ALLERGIES: Allergies  Allergen Reactions  . Codeine Other (See Comments), Shortness Of Breath and Rash    flushing flushing   . Levofloxacin In D5w Other (See Comments)    Leg pain  Leg pain   . Amoxicillin   . Amoxicillin-Pot Clavulanate Rash    Has patient had a PCN reaction causing immediate rash, facial/tongue/throat swelling, SOB or lightheadedness with hypotension: No Has patient had a PCN reaction causing severe rash involving mucus membranes or skin necrosis: No Has patient had a PCN reaction that required hospitalization: No Has patient had a PCN reaction occurring within the last 10 years: Yes If all of the above answers are "NO", then may proceed with Cephalosporin use.  Has patient had a PCN reaction causing immediate rash, facial/tongue/throat swelling, SOB or lightheadedness with hypotension: No Has patient had a PCN reaction causing severe rash involving mucus membranes or skin necrosis: No Has patient had a PCN reaction that required hospitalization: No Has patient had a PCN reaction occurring within the last 10 years: Yes If all of the above answers are "NO", then may proceed with Cephalosporin use.  . Escitalopram Oxalate Rash    MEDICATIONS: Current Outpatient Medications on File Prior to Visit  Medication Sig Dispense Refill  .  acetaminophen (TYLENOL) 500 MG tablet Take 500 mg by mouth every 6 (six) hours as needed (pain).     Marland Kitchen aspirin EC 81 MG tablet Take 81 mg by mouth daily.    . Calcium Carb-Cholecalciferol (CALCIUM  PLUS VITAMIN D3) 600-800 MG-UNIT TABS Take 2 tablets by mouth daily.     . cetirizine (ZYRTEC) 10 MG tablet Take 10 mg by mouth daily as needed for allergies.    . Cholecalciferol (VITAMIN D3) 25 MCG (1000 UT) CAPS Take 1,000 Units by mouth daily.     . cyclobenzaprine (FLEXERIL) 10 MG tablet Take 1 tablet (10 mg total) by mouth 3 (three) times daily as needed for muscle spasms. 60 tablet 2  . diphenhydramine-acetaminophen (TYLENOL PM) 25-500 MG TABS tablet Take 2 tablets by mouth at bedtime as needed (for pain.).    Marland Kitchen ezetimibe (ZETIA) 10 MG tablet Take 1 tablet (10 mg total) by mouth daily. 90 tablet 3  . metoprolol succinate (TOPROL-XL) 25 MG 24 hr tablet Take 1 tablet (25 mg total) by mouth 2 (two) times daily. 180 tablet 3  . nitroGLYCERIN (NITROSTAT) 0.4 MG SL tablet DISSOLVE 1 TABLET UNDER THE TONGUE EVERY 5 MINUTES FOR  3 DOSES AS NEEDED FOR CHEST PAIN (Patient taking differently: Place 0.4 mg under the tongue every 5 (five) minutes as needed for chest pain. ) 100 tablet 1  . omeprazole (PRILOSEC) 40 MG capsule Take 1 capsule (40 mg total) by mouth daily. 90 capsule 3  . oxyCODONE (ROXICODONE) 5 MG immediate release tablet Take 1 tablet (5 mg total) by mouth every 6 (six) hours as needed for severe pain. 20 tablet 0  . ramipril (ALTACE) 10 MG capsule Take 1 capsule (10 mg total) by mouth daily. 90 capsule 3  . rosuvastatin (CRESTOR) 40 MG tablet Take 1 tablet (40 mg total) by mouth daily. 90 tablet 3  . temazepam (RESTORIL) 15 MG capsule Take 1 capsule (15 mg total) by mouth at bedtime as needed for sleep. 90 capsule 1  . triamcinolone (NASACORT) 55 MCG/ACT nasal inhaler Place 2 sprays into the nose daily. (Patient taking differently: Place 2 sprays into the nose daily as needed (allergies). ) 3 Inhaler 3   No current facility-administered medications on file prior to visit.    PAST MEDICAL HISTORY: Past Medical History:  Diagnosis Date  . Asthma    infrequent problem - no treatment  since 2006  . Back pain   . Choroidal nevus of left eye    sees Dr. Gerarda Fraction at [facility].   . Constipation   . Coronary artery disease   . Dysrhythmia    SVT - ablation by Dr. Curt Bears on 05/21/2016 and episodes since  . Fatty liver   . GERD (gastroesophageal reflux disease)   . History of hiatal hernia    "very small"  . Hx of colonic polyp   . Hyperlipidemia   . Hypertension   . Joint pain   . Melanoma of eye, left ([place])    sees Dr. Cordelia Pen  . Myocardial infarction ([place])   . Osteoarthritis   . Shortness of breath   . Supraventricular tachycardia ([place])     PAST SURGICAL HISTORY: Past Surgical History:  Procedure Laterality Date  . ABDOMINAL HYSTERECTOMY    . ACHILLES TENDON REPAIR  10-07-11   torn on right, per Dr. Noemi Chapel   . ANKLE SURGERY     torn tendon left ankle  . BREAST BIOPSY    . CARDIAC CATHETERIZATION  N/A 03/14/2016   Procedure: Left Heart Cath and Coronary Angiography;  Surgeon: Peter M Martinique, MD;  Location: [place] [facility];  Service: Cardiovascular;  Laterality: N/A;  . CARDIAC CATHETERIZATION N/A 03/14/2016   Procedure: Coronary Stent Intervention;  Surgeon: Peter M Martinique, MD;  Location: [place] [facility];  Service: Cardiovascular;  Laterality: N/A;  . CARPAL TUNNEL RELEASE    . CATARACT EXTRACTION W/ INTRAOCULAR LENS IMPLANT Bilateral   . CHOLECYSTECTOMY    . COLONOSCOPY  06-23-14   per Dr. Ardis Hughs, adenomatous polyps, repeat in 5 yrs    . ELECTROPHYSIOLOGIC STUDY N/A 05/21/2016   Procedure: SVT Ablation;  Surgeon: [name], MD;  Location: [place] [facility];  Service: Cardiovascular;  Laterality: N/A;  . EXCISION HAGLUND'S DEFORMITY WITH ACHILLES TENDON REPAIR Left 07/02/2019   Procedure: Left Achilles tendon debridement and reconstruction and excision of Haglund deformity;  Surgeon: Wylene Simmer, MD;  Location: [place];  Service: Orthopedics;  Laterality: Left;  . EYE SURGERY Left 08/2016  . GASTROCNEMIUS RECESSION  Left 07/02/2019   Procedure: Gastroc recession;  Surgeon: Wylene Simmer, MD;  Location: [place];  Service: Orthopedics;  Laterality: Left;  . IR KYPHO THORACIC WITH BONE BIOPSY  12/18/2018  . IR RADIOLOGIST EVAL & MGMT  12/09/2018  . IR RADIOLOGIST EVAL & MGMT  04/30/2019  . KNEE CLOSED REDUCTION Left 02/03/2014   Procedure: CLOSED MANIPULATION LEFT KNEE;  Surgeon: Gearlean Alf, MD;  Location: [facility];  Service: Orthopedics;  Laterality: Left;  . POLYPECTOMY    . REVERSE SHOULDER ARTHROPLASTY     Left  . REVERSE SHOULDER ARTHROPLASTY Left 06/28/2017   Procedure: LEFT SHOULDER REVERSE SHOULDER ARTHROPLASTY;  Surgeon: Netta Cedars, MD;  Location: [place];  Service: Orthopedics;  Laterality: Left;  . TOTAL KNEE ARTHROPLASTY Left 12/21/2013   Procedure: LEFT TOTAL KNEE ARTHROPLASTY;  Surgeon: Gearlean Alf, MD;  Location: [facility];  Service: Orthopedics;  Laterality: Left;  Marland Kitchen VARICOSE VEIN SURGERY      SOCIAL HISTORY: Social History   Tobacco Use  . Smoking status: Never Smoker  . Smokeless tobacco: Never Used  Substance Use Topics  . Alcohol use: No    Alcohol/week: 0.0 standard drinks  . Drug use: No    FAMILY HISTORY: Family History  Problem Relation Age of Onset  . Colon polyps Mother   . Diabetes Mother   . High blood pressure Mother   . High Cholesterol Mother   . Thyroid disease Mother   . Anxiety disorder Mother   . Colon cancer Maternal Aunt   . Liver cancer Maternal Aunt   . Breast cancer Other   . Coronary artery disease Other   . Colon cancer Other   . Diabetes Other   . Hypertension Other   . Kidney disease Other   . Lung cancer Other   . Lung cancer Brother        spleen, bone  . Diabetes Father   . High blood pressure Father   . High Cholesterol Father   . Heart disease Father   . Depression Father   . Obesity Father   . Esophageal cancer Neg Hx   . Stomach cancer Neg Hx   . Rectal cancer Neg Hx    ROS: Review of Systems  Constitutional:  Positive for weight loss.  Endo/Heme/Allergies:       Negative for polyphagia.   PHYSICAL EXAM: Blood pressure 135/77, pulse 69, temperature 98 F (36.7 C),  height 5\' 4"  (1.626 m), weight 177 lb (80.3 kg), SpO2 98 %. Body mass index is 30.38 kg/m. Physical Exam Vitals reviewed.  Constitutional:      Appearance: Normal appearance. She is obese.  Cardiovascular:     Rate and Rhythm: Normal rate.     Pulses: Normal pulses.  Pulmonary:     Effort: Pulmonary effort is normal.     Breath sounds: Normal breath sounds.  Musculoskeletal:        General: Normal range of motion.  Skin:    General: Skin is warm and dry.  Neurological:     Mental Status: She is alert and oriented to person, place, and time.  Psychiatric:        Behavior: Behavior normal.   RECENT LABS AND TESTS: BMET    Component Value Date/Time   NA 132 (L) 03/31/2019 0934   NA 135 11/20/2018 1413   K 4.3 03/31/2019 0934   CL 97 03/31/2019 0934   CO2 28 03/31/2019 0934   GLUCOSE 78 03/31/2019 0934   BUN 20 03/31/2019 0934   BUN 21 11/20/2018 1413   CREATININE 0.61 03/31/2019 0934   CREATININE 0.59 10/01/2016 0939   CALCIUM 9.2 03/31/2019 0934   GFRNONAA >60 12/18/2018 0857   GFRAA >60 12/18/2018 0857   Lab Results  Component Value Date   HGBA1C 6.0 03/31/2019   HGBA1C 5.7 (H) 11/20/2018   HGBA1C 5.9 (H) 08/04/2018   Lab Results  Component Value Date   INSULIN 15.4 11/20/2018   INSULIN 33.5 (H) 08/04/2018   CBC    Component Value Date/Time   WBC 5.1 03/31/2019 0934   RBC 4.47 03/31/2019 0934   HGB 12.9 03/31/2019 0934   HCT 38.5 03/31/2019 0934   PLT 211.0 03/31/2019 0934   MCV 86.2 03/31/2019 0934   MCH 26.6 12/18/2018 0857   MCHC 33.5 03/31/2019 0934   RDW 14.2 03/31/2019 0934   LYMPHSABS 1.6 03/31/2019 0934   MONOABS 0.5 03/31/2019 0934   EOSABS 0.3 03/31/2019 0934   BASOSABS 0.0 03/31/2019 0934   Iron/TIBC/Ferritin/ %Sat No results found for: IRON, TIBC, FERRITIN, IRONPCTSAT Lipid  Panel     Component Value Date/Time   CHOL 105 03/31/2019 0934   CHOL 99 (L) 09/30/2018 1108   TRIG 148.0 03/31/2019 0934   HDL 26.20 (L) 03/31/2019 0934   HDL 28 (L) 09/30/2018 1108   CHOLHDL 4 03/31/2019 0934   VLDL 29.6 03/31/2019 0934   LDLCALC 49 03/31/2019 0934   LDLCALC 45 09/30/2018 1108   LDLDIRECT 96.0 03/11/2018 0904   Hepatic Function Panel     Component Value Date/Time   PROT 7.0 03/31/2019 0934   PROT 7.3 11/20/2018 1413   ALBUMIN 4.0 03/31/2019 0934   ALBUMIN 4.0 11/20/2018 1413   AST 21 03/31/2019 0934   ALT 18 03/31/2019 0934   ALKPHOS 47 03/31/2019 0934   BILITOT 0.4 03/31/2019 0934   BILITOT 0.3 11/20/2018 1413   BILIDIR 0.1 03/31/2019 0934   IBILI 0.3 07/09/2016 0905      Component Value Date/Time   TSH 1.78 03/31/2019 0934   TSH 1.120 08/04/2018 0921   TSH 1.49 03/11/2018 0904    OBESITY BEHAVIORAL INTERVENTION VISIT DOCUMENTATION FOR INSURANCE (~15 minutes)  ASK: We discussed the diagnosis of obesity with [name] today and Erlene agreed to give [name] permission to discuss obesity behavioral modification therapy today.  ASSESS: Melvene has the diagnosis of obesity and her BMI today is 30.5. Aaylah is in the action stage  of change.   ADVISE: Mohogany was educated on the multiple health risks of obesity as well as the benefit of weight loss to improve her health. She was advised of the need for long term treatment and the importance of lifestyle modifications to improve her current health and to decrease her risk of future health problems.  AGREE: Multiple dietary modification options and treatment options were discussed and  Claretta agreed to follow the recommendations documented in the above note.  ARRANGE: Kareli was educated on the importance of frequent visits to treat obesity as outlined per CMS and USPSTF guidelines and agreed to schedule her next follow up appointment today.  Migdalia Dk, am acting as Location manager for [employer],  DO  I have reviewed the above documentation for accuracy and completeness, and I agree with the above. -Jearld Lesch, DO

## 2019-10-13 ENCOUNTER — Ambulatory Visit: Payer: Medicare Other | Attending: Internal Medicine

## 2019-10-13 DIAGNOSIS — Z20822 Contact with and (suspected) exposure to covid-19: Secondary | ICD-10-CM

## 2019-10-14 LAB — NOVEL CORONAVIRUS, NAA: SARS-CoV-2, NAA: NOT DETECTED

## 2019-11-03 ENCOUNTER — Ambulatory Visit (INDEPENDENT_AMBULATORY_CARE_PROVIDER_SITE_OTHER): Payer: Medicare Other | Admitting: Family Medicine

## 2019-11-03 ENCOUNTER — Other Ambulatory Visit: Payer: Self-pay

## 2019-11-03 ENCOUNTER — Encounter (INDEPENDENT_AMBULATORY_CARE_PROVIDER_SITE_OTHER): Payer: Self-pay | Admitting: Family Medicine

## 2019-11-03 VITALS — BP 138/77 | HR 70 | Temp 98.0°F | Ht 64.0 in | Wt 178.0 lb

## 2019-11-03 DIAGNOSIS — E669 Obesity, unspecified: Secondary | ICD-10-CM | POA: Diagnosis not present

## 2019-11-03 DIAGNOSIS — R7303 Prediabetes: Secondary | ICD-10-CM

## 2019-11-03 DIAGNOSIS — Z6830 Body mass index (BMI) 30.0-30.9, adult: Secondary | ICD-10-CM | POA: Diagnosis not present

## 2019-11-05 ENCOUNTER — Encounter (INDEPENDENT_AMBULATORY_CARE_PROVIDER_SITE_OTHER): Payer: Self-pay | Admitting: Family Medicine

## 2019-11-05 NOTE — Progress Notes (Signed)
Chief Complaint:   OBESITY Natalie Hall is here to discuss her progress with her obesity treatment plan along with follow-up of her obesity related diagnoses. Natalie Hall is on the Category 2 Plan and states she is following her eating plan approximately 60% of the time. Natalie Hall states she is riding her bike 10 minutes 3 times per week.  Today's visit was #: 21 Starting weight: 213 lbs Starting date: 08/04/2018 Today's weight: 178 lbs Today's date: 11/03/2019 Total lbs lost to date: 35 Total lbs lost since last in-office visit: 0  Interim History: [name] has had increased stress due to her 70 year old mother having [place]. Natalie Hall tested negative. Her mother was hospitalized, but is now in a skilled nursing facility. [name] has been unable to stick to the plan very well. She finished physical therapy for her foot (status post foot surgery).  Subjective:   Prediabetes Natalie Hall is not on metformin. She denies polyphagia. Lab Results  Component Value Date   HGBA1C 6.0 03/31/2019   Assessment/Plan:   Prediabetes Natalie Hall will continue with the meal plan and she will continue to work on weight loss, exercise, and decreasing simple carbohydrates to help decrease the risk of diabetes. We will check A1c at the next visit.  Obesity Natalie Hall is currently in the action stage of change. As such, her goal is to continue with weight loss efforts. She has agreed to on the Category 2 Plan.   Natalie Hall will exercise as she has time.  We discussed the following behavioral modification strategies today: decreasing simple carbohydrates and planning for success.  [name] Natalie Hall Hall has agreed to follow-up with our clinic in 2 weeks. She was informed of the importance of frequent follow-up visits to maximize her success with intensive lifestyle modifications for her multiple health conditions.   Objective:   Blood pressure 138/77, pulse 70, temperature 98 F (36.7 C), temperature source Oral, height 5\' 4"  (1.626  m), weight 178 lb (80.7 kg), SpO2 99 %. Body mass index is 30.55 kg/m.  General: Cooperative, alert, well developed, in no acute distress. HEENT: Conjunctivae and lids unremarkable. Neck: No thyromegaly.  Cardiovascular: Regular rhythm.  Lungs: Normal work of breathing. Extremities: No edema.  Neurologic: No focal deficits.   Lab Results  Component Value Date   CREATININE 0.61 03/31/2019   BUN 20 03/31/2019   NA 132 (L) 03/31/2019   K 4.3 03/31/2019   CL 97 03/31/2019   CO2 28 03/31/2019   Lab Results  Component Value Date   ALT 18 03/31/2019   AST 21 03/31/2019   ALKPHOS 47 03/31/2019   BILITOT 0.4 03/31/2019   Lab Results  Component Value Date   HGBA1C 6.0 03/31/2019   HGBA1C 5.7 (H) 11/20/2018   HGBA1C 5.9 (H) 08/04/2018   Lab Results  Component Value Date   INSULIN 15.4 11/20/2018   INSULIN 33.5 (H) 08/04/2018   Lab Results  Component Value Date   TSH 1.78 03/31/2019   Lab Results  Component Value Date   CHOL 105 03/31/2019   HDL 26.20 (L) 03/31/2019   LDLCALC 49 03/31/2019   LDLDIRECT 96.0 03/11/2018   TRIG 148.0 03/31/2019   CHOLHDL 4 03/31/2019   Lab Results  Component Value Date   WBC 5.1 03/31/2019   HGB 12.9 03/31/2019   HCT 38.5 03/31/2019   MCV 86.2 03/31/2019   PLT 211.0 03/31/2019   No results found for: IRON, TIBC, FERRITIN   Ref. Range 11/20/2018 14:13  Vitamin D, 25-Hydroxy Latest  Ref Range: 30.0 - 100.0 ng/mL 75.9   Obesity Behavioral Intervention Documentation for Insurance:   Approximately 15 minutes were spent on the discussion below.  ASK: We discussed the diagnosis of obesity with [name] today and [name] agreed to give [name] permission to discuss obesity behavioral modification therapy today.  ASSESS: [name] has the diagnosis of obesity and her BMI today is 30.54. [name] is in the action stage of change.   ADVISE: [name] was educated on the multiple health risks of obesity as well as the benefit of weight loss to  improve her health. She was advised of the need for long term treatment and the importance of lifestyle modifications to improve her current health and to decrease her risk of future health problems.  AGREE: Multiple dietary modification options and treatment options were discussed and [name] agreed to follow the recommendations documented in the above note.  ARRANGE: [name] was educated on the importance of frequent visits to treat obesity as outlined per CMS and USPSTF guidelines and agreed to schedule her next follow up appointment today.  Attestation Statements:   Reviewed by clinician on day of visit: allergies, medications, problem list, medical history, surgical history, family history, social history, and previous encounter notes.   Corey Skains, am acting as Location manager for [employer], FNP-C.  I have reviewed the above documentation for accuracy and completeness, and I agree with the above. -  Georgianne Fick, FNP

## 2019-11-17 ENCOUNTER — Other Ambulatory Visit: Payer: Self-pay

## 2019-11-17 ENCOUNTER — Encounter (INDEPENDENT_AMBULATORY_CARE_PROVIDER_SITE_OTHER): Payer: Self-pay | Admitting: Family Medicine

## 2019-11-17 ENCOUNTER — Ambulatory Visit (INDEPENDENT_AMBULATORY_CARE_PROVIDER_SITE_OTHER): Payer: Medicare Other | Admitting: Family Medicine

## 2019-11-17 VITALS — BP 120/72 | HR 62 | Temp 97.8°F | Ht 64.0 in | Wt 177.0 lb

## 2019-11-17 DIAGNOSIS — E7849 Other hyperlipidemia: Secondary | ICD-10-CM

## 2019-11-17 DIAGNOSIS — E559 Vitamin D deficiency, unspecified: Secondary | ICD-10-CM

## 2019-11-17 DIAGNOSIS — E669 Obesity, unspecified: Secondary | ICD-10-CM | POA: Diagnosis not present

## 2019-11-17 DIAGNOSIS — R7303 Prediabetes: Secondary | ICD-10-CM

## 2019-11-17 DIAGNOSIS — Z6830 Body mass index (BMI) 30.0-30.9, adult: Secondary | ICD-10-CM

## 2019-11-17 NOTE — Progress Notes (Signed)
Chief Complaint:   OBESITY Natalie Hall is here to discuss her progress with her obesity treatment plan along with follow-up of her obesity related diagnoses. Natalie Hall is on the Category 2 Plan and states she is following her eating plan approximately 80-90% of the time. Natalie Hall states she is walking for 30 minutes 2 times per week.  Today's visit was #: 22 Starting weight: 213 lbs Starting date: 08/04/2018 Today's weight: 177 lbs Today's date: 11/17/2019 Total lbs lost to date: 36 lbs Total lbs lost since last in-office visit: 1 lb  Interim History: Natalie Hall is sticking to the plan very well, but says she is a little bored with the food.  She reports having a [place] sandwich everyday at lunch.  Subjective:   1. Prediabetes Natalie Hall reports occasional polyphagia.  She is not on metformin.  Lab Results  Component Value Date   HGBA1C 6.0 03/31/2019   2. Other hyperlipidemia She is taking Crestor and Zetia at this time.  Denies chest pain, shortness of breath. LDL is at goal. HDL is low.  Lab Results  Component Value Date   CHOL 105 03/31/2019   HDL 26.20 (L) 03/31/2019   LDLCALC 49 03/31/2019   LDLDIRECT 96.0 03/11/2018   TRIG 148.0 03/31/2019   CHOLHDL 4 03/31/2019   3. Vitamin D deficiency Natalie Hall is taking OTC vitamin D 1000 IU daily.  Last vitamin D level was at goal.  Denies fatigue.  Assessment/Plan:   1. Prediabetes I will check Criselda's A1c, fasting insulin level, and glucose today.  She declines metformin.  She will continue her meal plan. - Hemoglobin A1c - Insulin, random  2. Other hyperlipidemia Fasting lipid panel today.  Continue Zetia and Crestor.  Increase exercise to improve HDL. - Comprehensive metabolic panel - Lipid Panel With LDL/HDL Ratio  3. Vitamin D deficiency Will check vitamin D level today.  Natalie Hall will continue her OTC vitamin D 1000 IU daily. - VITAMIN D 25 Hydroxy (Vit-D Deficiency, Fractures)  4. Class 1 obesity with serious comorbidity  and body mass index (BMI) of 30.0 to 30.9 in adult, unspecified obesity type Natalie Hall is currently in the action stage of change. As such, her goal is to continue with weight loss efforts. She has agreed to the Category 2 Plan.   We will add lunch options.  I have provided a handout:  Smart Fruit.  She may incorporate oranges a few days a week.  I have encouraged her to try different cold cuts such as roast beef and add more non-starchy vegetables to her sandwich.  Exercise goals: Natalie Hall will increase her exercise to 4 days per week.  Behavioral modification strategies: meal planning and cooking strategies and planning for success.  Natalie Hall has agreed to follow-up with our clinic in 2 weeks. She was informed of the importance of frequent follow-up visits to maximize her success with intensive lifestyle modifications for her multiple health conditions.   Natalie Hall was informed we would discuss her lab results at her next visit unless there is a critical issue that needs to be addressed sooner. Natalie Hall agreed to keep her next visit at the agreed upon time to discuss these results.  Objective:   Blood pressure 120/72, pulse 62, temperature 97.8 F (36.6 C), temperature source Oral, height 5\' 4"  (1.626 m), weight 177 lb (80.3 kg), SpO2 99 %. Body mass index is 30.38 kg/m.  General: Cooperative, alert, well developed, in no acute distress. HEENT: Conjunctivae and lids unremarkable. Cardiovascular: Regular rhythm.  Lungs: Normal  work of breathing. Neurologic: No focal deficits.   Lab Results  Component Value Date   CREATININE 0.61 03/31/2019   BUN 20 03/31/2019   NA 132 (L) 03/31/2019   K 4.3 03/31/2019   CL 97 03/31/2019   CO2 28 03/31/2019   Lab Results  Component Value Date   ALT 18 03/31/2019   AST 21 03/31/2019   ALKPHOS 47 03/31/2019   BILITOT 0.4 03/31/2019   Lab Results  Component Value Date   HGBA1C 6.0 03/31/2019   HGBA1C 5.7 (H) 11/20/2018   HGBA1C 5.9 (H) 08/04/2018    Lab Results  Component Value Date   INSULIN 15.4 11/20/2018   INSULIN 33.5 (H) 08/04/2018   Lab Results  Component Value Date   TSH 1.78 03/31/2019   Lab Results  Component Value Date   CHOL 105 03/31/2019   HDL 26.20 (L) 03/31/2019   LDLCALC 49 03/31/2019   LDLDIRECT 96.0 03/11/2018   TRIG 148.0 03/31/2019   CHOLHDL 4 03/31/2019   Lab Results  Component Value Date   WBC 5.1 03/31/2019   HGB 12.9 03/31/2019   HCT 38.5 03/31/2019   MCV 86.2 03/31/2019   PLT 211.0 03/31/2019   Obesity Behavioral Intervention Documentation for Insurance:   Approximately 15 minutes were spent on the discussion below.  ASK: We discussed the diagnosis of obesity with Natalie Hall today and Natalie Hall agreed to give [name] permission to discuss obesity behavioral modification therapy today.  ASSESS: Natalie Hall has the diagnosis of obesity and her BMI today is 30.5. Natalie Hall is in the action stage of change.   ADVISE: Natalie Hall was educated on the multiple health risks of obesity as well as the benefit of weight loss to improve her health. She was advised of the need for long term treatment and the importance of lifestyle modifications to improve her current health and to decrease her risk of future health problems.  AGREE: Multiple dietary modification options and treatment options were discussed and Natalie Hall agreed to follow the recommendations documented in the above note.  ARRANGE: Natalie Hall was educated on the importance of frequent visits to treat obesity as outlined per CMS and USPSTF guidelines and agreed to schedule her next follow up appointment today.  Attestation Statements:   Reviewed by clinician on day of visit: allergies, medications, problem list, medical history, surgical history, family history, social history, and previous encounter notes.  I, Water quality scientist, CMA, am acting as Location manager for [employer], FNP-C.  I have reviewed the above documentation for accuracy and completeness, and I  agree with the above. -  Georgianne Fick, FNP

## 2019-11-18 LAB — INSULIN, RANDOM: INSULIN: 18.5 u[IU]/mL (ref 2.6–24.9)

## 2019-11-18 LAB — COMPREHENSIVE METABOLIC PANEL
ALT: 22 IU/L (ref 0–32)
AST: 27 IU/L (ref 0–40)
Albumin/Globulin Ratio: 1.5 (ref 1.2–2.2)
Albumin: 4.3 g/dL (ref 3.8–4.8)
Alkaline Phosphatase: 54 IU/L (ref 39–117)
BUN/Creatinine Ratio: 27 (ref 12–28)
BUN: 16 mg/dL (ref 8–27)
Bilirubin Total: 0.3 mg/dL (ref 0.0–1.2)
CO2: 23 mmol/L (ref 20–29)
Calcium: 9.6 mg/dL (ref 8.7–10.3)
Chloride: 102 mmol/L (ref 96–106)
Creatinine, Ser: 0.6 mg/dL (ref 0.57–1.00)
GFR calc Af Amer: 108 mL/min/{1.73_m2} (ref >59)
GFR calc non Af Amer: 93 mL/min/{1.73_m2} (ref >59)
Globulin, Total: 2.8 g/dL (ref 1.5–4.5)
Glucose: 93 mg/dL (ref 65–99)
Potassium: 4.8 mmol/L (ref 3.5–5.2)
Sodium: 138 mmol/L (ref 134–144)
Total Protein: 7.1 g/dL (ref 6.0–8.5)

## 2019-11-18 LAB — LIPID PANEL WITH LDL/HDL RATIO
Cholesterol, Total: 110 mg/dL (ref 100–199)
HDL: 29 mg/dL — ABNORMAL LOW (ref >39)
LDL Chol Calc (NIH): 59 mg/dL (ref 0–99)
LDL/HDL Ratio: 2 ratio (ref 0.0–3.2)
Triglycerides: 123 mg/dL (ref 0–149)
VLDL Cholesterol Cal: 22 mg/dL (ref 5–40)

## 2019-11-18 LAB — HEMOGLOBIN A1C
Est. average glucose Bld gHb Est-mCnc: 114 mg/dL
Hgb A1c MFr Bld: 5.6 % (ref 4.8–5.6)

## 2019-11-18 LAB — VITAMIN D 25 HYDROXY (VIT D DEFICIENCY, FRACTURES): Vit D, 25-Hydroxy: 69.1 ng/mL (ref 30.0–100.0)

## 2019-11-19 ENCOUNTER — Encounter (INDEPENDENT_AMBULATORY_CARE_PROVIDER_SITE_OTHER): Payer: Self-pay | Admitting: Family Medicine

## 2019-12-02 ENCOUNTER — Ambulatory Visit (INDEPENDENT_AMBULATORY_CARE_PROVIDER_SITE_OTHER): Payer: Medicare Other | Admitting: Family Medicine

## 2019-12-02 ENCOUNTER — Encounter (INDEPENDENT_AMBULATORY_CARE_PROVIDER_SITE_OTHER): Payer: Self-pay | Admitting: Family Medicine

## 2019-12-02 ENCOUNTER — Other Ambulatory Visit: Payer: Self-pay

## 2019-12-02 VITALS — BP 133/73 | HR 70 | Temp 97.5°F | Ht 64.0 in | Wt 177.0 lb

## 2019-12-02 DIAGNOSIS — E559 Vitamin D deficiency, unspecified: Secondary | ICD-10-CM | POA: Diagnosis not present

## 2019-12-02 DIAGNOSIS — E8881 Metabolic syndrome: Secondary | ICD-10-CM | POA: Diagnosis not present

## 2019-12-02 DIAGNOSIS — E669 Obesity, unspecified: Secondary | ICD-10-CM | POA: Diagnosis not present

## 2019-12-02 DIAGNOSIS — Z6830 Body mass index (BMI) 30.0-30.9, adult: Secondary | ICD-10-CM

## 2019-12-02 DIAGNOSIS — E88819 Insulin resistance, unspecified: Secondary | ICD-10-CM | POA: Insufficient documentation

## 2019-12-02 NOTE — Progress Notes (Signed)
Chief Complaint:   OBESITY Natalie Hall is here to discuss her progress with her obesity treatment plan along with follow-up of her obesity related diagnoses. Natalie Hall is on the Category 2 Plan and states she is following her eating plan approximately 80% of the time. Natalie Hall states she is walking 30 minutes 2 times per week.  Today's visit was #: 23 Starting weight: 213 lbs Starting date: 08/04/2018 Today's weight: 177 lbs Today's date: 12/02/2019 Total lbs lost to date: 36 Total lbs lost since last in-office visit: 0  Interim History: Natalie Hall is very frustrated with her slow weight loss. She has started eating different lunch meat at lunch and she is more satisfied with lunch.  Subjective:   Insulin resistance Natalie Hall A1c has decreased to 5.6 from 6.0. She admits to polyphagia at times. She is not on metformin. We discussed metformin in depth.   Lab Results  Component Value Date   INSULIN 18.5 11/17/2019   INSULIN 15.4 11/20/2018   INSULIN 33.5 (H) 08/04/2018   Lab Results  Component Value Date   HGBA1C 5.6 11/17/2019   Vitamin D deficiency Natalie Hall Vitamin D level was 69.1 on 11/17/19 and is at goal. Level is maintained on 1,000 OTC vitamin D daily.   Assessment/Plan:   Insulin resistance Natalie Hall will continue to work on weight loss and decreasing simple carbohydrates to help decrease the risk of diabetes. Natalie Hall will discuss metformin with her cardiologist.  Vitamin D deficiency Low Vitamin D level contributes to fatigue and are associated with obesity, breast, and colon cancer. Natalie Hall will continue to take OTC Vitamin D @1 ,000 IU daily and she will follow-up for routine testing of Vitamin D, at least 2-3 times per year to avoid over-replacement.  Class 1 obesity with serious comorbidity and body mass index (BMI) of 30.0 to 30.9 in adult, unspecified obesity type Natalie Hall is currently in the action stage of change. As such, her goal is to continue with weight loss  efforts. She has agreed to the Category 2 Plan.   Exercise goals: I encouraged Natalie Hall to start exercise.  Behavioral modification strategies: planning for success.  We will repeat indirect calorimetry at the next visit.  Natalie Hall has agreed to follow-up with our clinic in 2 weeks. She was informed of the importance of frequent follow-up visits to maximize her success with intensive lifestyle modifications for her multiple health conditions.   Objective:   Blood pressure 133/73, pulse 70, temperature (!) 97.5 F (36.4 C), temperature source Oral, height 5\' 4"  (1.626 m), weight 177 lb (80.3 kg). Body mass index is 30.38 kg/m.  General: Cooperative, alert, well developed, in no acute distress. HEENT: Conjunctivae and lids unremarkable. Cardiovascular: Regular rhythm.  Lungs: Normal work of breathing. Neurologic: No focal deficits.   Lab Results  Component Value Date   CREATININE 0.60 11/17/2019   BUN 16 11/17/2019   NA 138 11/17/2019   K 4.8 11/17/2019   CL 102 11/17/2019   CO2 23 11/17/2019   Lab Results  Component Value Date   ALT 22 11/17/2019   AST 27 11/17/2019   ALKPHOS 54 11/17/2019   BILITOT 0.3 11/17/2019   Lab Results  Component Value Date   HGBA1C 5.6 11/17/2019   HGBA1C 6.0 03/31/2019   HGBA1C 5.7 (H) 11/20/2018   HGBA1C 5.9 (H) 08/04/2018   Lab Results  Component Value Date   INSULIN 18.5 11/17/2019   INSULIN 15.4 11/20/2018   INSULIN 33.5 (H) 08/04/2018   Lab Results  Component  Value Date   TSH 1.78 03/31/2019   Lab Results  Component Value Date   CHOL 110 11/17/2019   HDL 29 (L) 11/17/2019   LDLCALC 59 11/17/2019   LDLDIRECT 96.0 03/11/2018   TRIG 123 11/17/2019   CHOLHDL 4 03/31/2019   Lab Results  Component Value Date   WBC 5.1 03/31/2019   HGB 12.9 03/31/2019   HCT 38.5 03/31/2019   MCV 86.2 03/31/2019   PLT 211.0 03/31/2019   No results found for: IRON, TIBC, FERRITIN   Ref. Range 11/17/2019 08:03  Vitamin D, 25-Hydroxy Latest  Ref Range: 30.0 - 100.0 ng/mL 69.1    Attestation Statements:   Reviewed by clinician on day of visit: allergies, medications, problem list, medical history, surgical history, family history, social history, and previous encounter notes.  Corey Skains, am acting as Location manager for [employer], FNP-C.  I have reviewed the above documentation for accuracy and completeness, and I agree with the above. -  Leith Szafranski [employer], FNP-C

## 2019-12-03 ENCOUNTER — Ambulatory Visit (INDEPENDENT_AMBULATORY_CARE_PROVIDER_SITE_OTHER): Payer: Medicare Other | Admitting: Internal Medicine

## 2019-12-03 ENCOUNTER — Encounter: Payer: Self-pay | Admitting: Internal Medicine

## 2019-12-03 VITALS — BP 109/70 | HR 62 | Ht 64.5 in | Wt 182.0 lb

## 2019-12-03 DIAGNOSIS — E782 Mixed hyperlipidemia: Secondary | ICD-10-CM

## 2019-12-03 DIAGNOSIS — I10 Essential (primary) hypertension: Secondary | ICD-10-CM | POA: Diagnosis not present

## 2019-12-03 DIAGNOSIS — I2510 Atherosclerotic heart disease of native coronary artery without angina pectoris: Secondary | ICD-10-CM | POA: Diagnosis not present

## 2019-12-03 NOTE — Progress Notes (Signed)
OFFICE NOTE  Chief Complaint:  Routine follow-up  Primary Care Physician: Laurey Morale, MD  HPI:  Natalie Hall is a 69 y.o. female who I last saw in the hospital a few weeks ago. She was admitted for an SVT which spontaneously converted in the ER on Cardizem and did not return. Unfortunately she had a non-ST elevation MI with troponin peak of 1.64. She underwent a Lexiscan Myoview which demonstrated a moderate region of reversibility in the mid and apical segments of the inferior lateral Brasel consistent with ischemia. EF was 61%. She then was referred to cardiac catheterization which demonstrated the following:  Cardiac Cath 05/24   Prox RCA lesion, 25% stenosed.  The left ventricular systolic function is normal.  Prox LAD to Mid LAD lesion, 15% stenosed.  Mid RCA-1 lesion, 95% stenosed.  Mid RCA-2 lesion, 95% stenosed. Post intervention, there is a 0% residual stenosis.SYNERGY DES 3X32 1. Single vessel obstructive CAD 2. Normal LV function 3. Successful stenting of the mid to distal RCA with a DES, SYNERGY DES 3X32 Plan: DAPT for one year. Patient may be a candidate for the TWILIGHT trial in which case study protocol will be followed. Anticipate DC in am.  Natalie Hall returns today and is doing quite well. She denies any chest pain or worsening shortness of breath. She is compliant with her medications. She did enroll in the TWILIGHT trial. Unfortunately, she was considering shoulder surgery but this will be delayed due to the necessity of dual antiplatelet therapy with her recent stent. I advised her the Tylenol or her tramadol would be safe medicines used for pain.  07/13/2016  Since I last saw her, she was admitted to the hospital for adenosine-responsive AVNRT. She underwent successful catheter ablation and has not had recurrence. She denies any chest pain or dyspnea. She remains in the TWILIGHT trial with Brillinta. Recent lipid profilw shows LDL remains not at goal at  104. HDL low at 28. She is on Crestor 10 mg daily.  10/05/2016  Natalie Hall returns today for follow-up. She seems to be doing well from a coronary standpoint. She denies any chest pain or worsening dyspnea. 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 She underwent placement of a radioactive seed which was ultimately removed after a week and she is recovering from that procedure. She was off of her aspirin for 1 week but remained on Brilinta. She is still in the twilight trial. Her cholesterol was higher than it had been previously. I increased her Crestor to 20 mg daily and we are awaiting her lipid profile which was recently drawn. She denies any recurrent AVNRT.  06/10/2017  Natalie Hall was seen today in follow-up. She was seen in the [facility] for follow-up of the Twilight trial - this has completed. She is now more than one year since drug-eluting stent placement. She is angina free. At this point she will no longer need dual antiplatelet therapy and I advised that she go on aspirin 81 mg daily. Cholesterol is now at goal based on labs 3 months ago which showed total cholesterol 139, triglycerides 177, HL 34 and LDL-C is 69. She is on Crestor 40 mg daily. She is contemplating left shoulder surgery and from my standpoint she is clear for that.  12/30/2017  Natalie Hall was seen today in follow-up.  This is an annual follow-up.  Overall she is doing well.  She had a successful shoulder surgery and  has good range of motion in her left shoulder.  She has been under a lot of stress taking care of her mother-in-law who recently passed away.  She did have labs in December which showed total cholesterol 146, triglycerides 196, HDL 32 and LDL 75.  She does report she had some weight gain and decreased activity and I feel like her cholesterol could be better controlled with more vigilant diet.  She denies any recurrent chest pain or worsening shortness of  breath.  07/14/2018  Natalie Hall is seen today for routine follow-up. It has been 6 months with a plan for weight loss and dietary changes to get cholesterol to goal. Unfortunately, weight is not significantly different. Repeat lipid profile shows cholesterol is higher with TC 157, TG 241, HDL 28 a nd LDL 81. Her goal LDL is <70. I had not recommended additional medication due to her commitment to making dietary changes, however, at this point, she still has additional residual risk and her cholesterol is actually higher, so I'm recommending therapy.  06/08/2019  Natalie Hall is seen today in follow-up.  Overall she seems to be doing well.  She has been working closely with the weight management center at [facility] and is lost a significant weight.  She went from 213 pounds down to 183 in the office today however she says weighs in the 170s at home.  Overall she remains asymptomatic, denies chest pain or worsening shortness of breath.  EKG shows a sinus bradycardia in the 50s today.  Her labs all look excellent recently.  Lipids 2 months ago showed total cholesterol 105, triglycerides 148, HDL 26 and LDL 49. Also, she is seeking clearance for achilles surgery with Dr. Doran Durand.  12/03/2019  Natalie Hall is seen today in follow-up.  She continues to do well.  She did have her Achilles tendon repair.  She was not quite as active but recently has picked that up.  She continues to work with the weight management center.  Her weight is slightly trended up but in general is down.  Her cholesterol is much better.  Labs 2 weeks ago showed total cholesterol 110, triglycerides 123, HDL 29 LDL 59.  She denies any chest pain.  She has had no further arrhythmias.  PMHx:  Past Medical History:  Diagnosis Date  . Asthma    infrequent problem - no treatment since 2006  . Back pain   . Choroidal nevus of left eye    sees Dr. Gerarda Fraction at [facility].   . Constipation   . Coronary artery disease   . Dysrhythmia    SVT  - ablation by Dr. Curt Bears on 05/21/2016 and episodes since  . Fatty liver   . GERD (gastroesophageal reflux disease)   . History of hiatal hernia    "very small"  . Hx of colonic polyp   . Hyperlipidemia   . Hypertension   . Joint pain   . Melanoma of eye, left ([place])    sees Dr. Cordelia Pen  . Myocardial infarction ([place])   . Osteoarthritis   . Shortness of breath   . Supraventricular tachycardia [facility])     Past Surgical History:  Procedure Laterality Date  . ABDOMINAL HYSTERECTOMY    . ACHILLES TENDON REPAIR  10-07-11   torn on right, per Dr. Noemi Chapel   . ANKLE SURGERY     torn tendon left ankle  . BREAST BIOPSY    . CARDIAC CATHETERIZATION N/A 03/14/2016   Procedure: Left Heart  Cath and Coronary Angiography;  Surgeon: Peter M Martinique, MD;  Location: [place] CV [facility];  Service: Cardiovascular;  Laterality: N/A;  . CARDIAC CATHETERIZATION N/A 03/14/2016   Procedure: Coronary Stent Intervention;  Surgeon: Peter M Martinique, MD;  Location: [place] [facility];  Service: Cardiovascular;  Laterality: N/A;  . CARPAL TUNNEL RELEASE    . CATARACT EXTRACTION W/ INTRAOCULAR LENS IMPLANT Bilateral   . CHOLECYSTECTOMY    . COLONOSCOPY  06-23-14   per Dr. Ardis Hughs, adenomatous polyps, repeat in 5 yrs    . ELECTROPHYSIOLOGIC STUDY N/A 05/21/2016   Procedure: SVT Ablation;  Surgeon: [name], MD;  Location: [place] CV [facility];  Service: Cardiovascular;  Laterality: N/A;  . EXCISION HAGLUND'S DEFORMITY WITH ACHILLES TENDON REPAIR Left 07/02/2019   Procedure: Left Achilles tendon debridement and reconstruction and excision of Haglund deformity;  Surgeon: Wylene Simmer, MD;  Location: [place];  Service: Orthopedics;  Laterality: Left;  . EYE SURGERY Left 08/2016  . GASTROCNEMIUS RECESSION Left 07/02/2019   Procedure: Gastroc recession;  Surgeon: Wylene Simmer, MD;  Location: [place];  Service: Orthopedics;  Laterality: Left;  . IR KYPHO THORACIC WITH BONE BIOPSY   12/18/2018  . IR RADIOLOGIST EVAL & MGMT  12/09/2018  . IR RADIOLOGIST EVAL & MGMT  04/30/2019  . KNEE CLOSED REDUCTION Left 02/03/2014   Procedure: CLOSED MANIPULATION LEFT KNEE;  Surgeon: Gearlean Alf, MD;  Location: [facility];  Service: Orthopedics;  Laterality: Left;  . POLYPECTOMY    . REVERSE SHOULDER ARTHROPLASTY     Left  . REVERSE SHOULDER ARTHROPLASTY Left 06/28/2017   Procedure: LEFT SHOULDER REVERSE SHOULDER ARTHROPLASTY;  Surgeon: Netta Cedars, MD;  Location: [place];  Service: Orthopedics;  Laterality: Left;  . TOTAL KNEE ARTHROPLASTY Left 12/21/2013   Procedure: LEFT TOTAL KNEE ARTHROPLASTY;  Surgeon: Gearlean Alf, MD;  Location: [facility];  Service: Orthopedics;  Laterality: Left;  Marland Kitchen VARICOSE VEIN SURGERY      FAMHx:  Family History  Problem Relation Age of Onset  . Colon polyps Mother   . Diabetes Mother   . High blood pressure Mother   . High Cholesterol Mother   . Thyroid disease Mother   . Anxiety disorder Mother   . Colon cancer Maternal Aunt   . Liver cancer Maternal Aunt   . Breast cancer Other   . Coronary artery disease Other   . Colon cancer Other   . Diabetes Other   . Hypertension Other   . Kidney disease Other   . Lung cancer Other   . Lung cancer Brother        spleen, bone  . Diabetes Father   . High blood pressure Father   . High Cholesterol Father   . Heart disease Father   . Depression Father   . Obesity Father   . Esophageal cancer Neg Hx   . Stomach cancer Neg Hx   . Rectal cancer Neg Hx     SOCHx:   reports that she has never smoked. She has never used smokeless tobacco. She reports that she does not drink alcohol or use drugs.  ALLERGIES:  Allergies  Allergen Reactions  . Codeine Other (See Comments), Shortness Of Breath and Rash    flushing flushing   . Levofloxacin In D5w Other (See Comments)    Leg pain  Leg pain   . Amoxicillin   . Amoxicillin-Pot Clavulanate Rash    Has patient had a PCN reaction causing  immediate rash,  facial/tongue/throat swelling, SOB or lightheadedness with hypotension: No Has patient had a PCN reaction causing severe rash involving mucus membranes or skin necrosis: No Has patient had a PCN reaction that required hospitalization: No Has patient had a PCN reaction occurring within the last 10 years: Yes If all of the above answers are "NO", then may proceed with Cephalosporin use.  Has patient had a PCN reaction causing immediate rash, facial/tongue/throat swelling, SOB or lightheadedness with hypotension: No Has patient had a PCN reaction causing severe rash involving mucus membranes or skin necrosis: No Has patient had a PCN reaction that required hospitalization: No Has patient had a PCN reaction occurring within the last 10 years: Yes If all of the above answers are "NO", then may proceed with Cephalosporin use.  . Escitalopram Oxalate Rash    ROS: Pertinent items noted in HPI and remainder of comprehensive ROS otherwise negative.  HOME MEDS: Current Outpatient Medications  Medication Sig Dispense Refill  . acetaminophen (TYLENOL) 500 MG tablet Take 500 mg by mouth every 6 (six) hours as needed (pain).     Marland Kitchen aspirin EC 81 MG tablet Take 81 mg by mouth daily.    . Calcium Carb-Cholecalciferol (CALCIUM PLUS VITAMIN D3) 600-800 MG-UNIT TABS Take 2 tablets by mouth daily.     . cetirizine (ZYRTEC) 10 MG tablet Take 10 mg by mouth daily as needed for allergies.    . Cholecalciferol (VITAMIN D3) 25 MCG (1000 UT) CAPS Take 1,000 Units by mouth daily.     . cyclobenzaprine (FLEXERIL) 10 MG tablet Take 1 tablet (10 mg total) by mouth 3 (three) times daily as needed for muscle spasms. 60 tablet 2  . diphenhydramine-acetaminophen (TYLENOL PM) 25-500 MG TABS tablet Take 2 tablets by mouth at bedtime as needed (for pain.).    Marland Kitchen ezetimibe (ZETIA) 10 MG tablet Take 1 tablet (10 mg total) by mouth daily. 90 tablet 3  . metoprolol succinate (TOPROL-XL) 25 MG 24 hr tablet Take 1 tablet (25 mg  total) by mouth 2 (two) times daily. 180 tablet 3  . omeprazole (PRILOSEC) 40 MG capsule Take 1 capsule (40 mg total) by mouth daily. 90 capsule 3  . ramipril (ALTACE) 10 MG capsule Take 1 capsule (10 mg total) by mouth daily. 90 capsule 3  . rosuvastatin (CRESTOR) 40 MG tablet Take 1 tablet (40 mg total) by mouth daily. 90 tablet 3  . temazepam (RESTORIL) 15 MG capsule Take 1 capsule (15 mg total) by mouth at bedtime as needed for sleep. 90 capsule 1  . triamcinolone (NASACORT) 55 MCG/ACT nasal inhaler Place 2 sprays into the nose daily. (Patient taking differently: Place 2 sprays into the nose daily as needed (allergies). ) 3 Inhaler 3   No current facility-administered medications for this visit.    LABS/IMAGING: No results found for this or any previous visit (from the past 48 hour(s)). No results found.  WEIGHTS: Wt Readings from Last 3 Encounters:  12/03/19 182 lb (82.6 kg)  12/02/19 177 lb (80.3 kg)  11/17/19 177 lb (80.3 kg)    VITALS: BP 109/70   Pulse 62   Ht 5' 4.5" (1.638 m)   Wt 182 lb (82.6 kg)   LMP  (LMP Unknown)   SpO2 99%   BMI 30.76 kg/m   EXAM: General appearance: alert and no distress Neck: no carotid bruit and no JVD Lungs: clear to auscultation bilaterally Heart: regular rate and rhythm, S1, S2 normal, no murmur, click, rub or gallop Abdomen:  soft, non-tender; bowel sounds normal; no masses,  no organomegaly Extremities: extremities normal, atraumatic, no cyanosis or edema Pulses: 2+ and symmetric Skin: Skin color, texture, turgor normal. No rashes or lesions Neurologic: Grossly normal Psych: Pleasant  EKG: Normal sinus rhythm 62, low voltage QRS-personally reviewed  ASSESSMENT: 1. Coronary artery disease status post PCI with a drug-eluting stent to the mid/distal RCA (02/2016) 2. AVNRT - s/p catheter ablation in 04/2016 3. S/p NSTEMI with abnormal nuclear stress test 4. Dyslipidemia 5. Hypertension  PLAN: 1.   Mrs. Kreager is to be doing very  well.  She is working with the weight management clinic and is lost weight.  Her lipids are better.  She denies any recurrent AVNRT, palpitations or tachyarrhythmias.  She denies any further chest pain.  Blood pressure had been well controlled.  I encouraged continued physical exercise to help with weight loss and overall improved cardiovascular fitness.  Plan follow-up with me annually or sooner as necessary.  Pixie Casino, MD, [facility] [place] Director of the Advanced Lipid Disorders &  Cardiovascular Risk Reduction Clinic Diplomate of the American Board of Clinical Lipidology Attending Cardiologist  Direct Dial: [phone]  Fax: [phone]  Website:  [email or web address] 12/03/2019, 10:28 AM

## 2019-12-03 NOTE — Patient Instructions (Signed)
Medication Instructions:  Your physician recommends that you continue on your current medications as directed. Please refer to the Current Medication list given to you today.  *If you need a refill on your cardiac medications before your next appointment, please call your pharmacy*  Follow-Up: At [facility], you and your health needs are our priority.  As part of our continuing mission to provide you with exceptional heart care, we have created designated Provider Care Teams.  These Care Teams include your primary Cardiologist (physician) and Advanced Practice Providers (APPs -  Physician Assistants and Nurse Practitioners) who all work together to provide you with the care you need, when you need it.  Your next appointment:   12 month(s)  The format for your next appointment:   In Person  Provider:   You may see Kenneth C Hilty, MD or one of the following Advanced Practice Providers on your designated Care Team:    Hao Meng, PA-C  Angela Duke, PA-C or   Krista Kroeger, PA-C   Other Instructions   

## 2019-12-16 ENCOUNTER — Encounter (INDEPENDENT_AMBULATORY_CARE_PROVIDER_SITE_OTHER): Payer: Self-pay | Admitting: Family Medicine

## 2019-12-16 ENCOUNTER — Other Ambulatory Visit: Payer: Self-pay

## 2019-12-16 ENCOUNTER — Ambulatory Visit (INDEPENDENT_AMBULATORY_CARE_PROVIDER_SITE_OTHER): Payer: Medicare Other | Admitting: Family Medicine

## 2019-12-16 VITALS — BP 133/74 | HR 61 | Temp 97.8°F | Ht 64.0 in | Wt 179.0 lb

## 2019-12-16 DIAGNOSIS — J45909 Unspecified asthma, uncomplicated: Secondary | ICD-10-CM | POA: Diagnosis not present

## 2019-12-16 DIAGNOSIS — R7303 Prediabetes: Secondary | ICD-10-CM

## 2019-12-16 DIAGNOSIS — Z6830 Body mass index (BMI) 30.0-30.9, adult: Secondary | ICD-10-CM

## 2019-12-16 DIAGNOSIS — I10 Essential (primary) hypertension: Secondary | ICD-10-CM | POA: Diagnosis not present

## 2019-12-16 DIAGNOSIS — E669 Obesity, unspecified: Secondary | ICD-10-CM

## 2019-12-16 MED ORDER — METFORMIN HCL 500 MG PO TABS: 500.0000 mg | ORAL_TABLET | Freq: Every day | ORAL | 0 refills | Status: DC

## 2019-12-16 NOTE — Progress Notes (Signed)
Chief Complaint:   Natalie Hall is here to discuss her progress with her Natalie treatment plan along with follow-up of her Natalie related diagnoses. Natalie Hall is on the Category 2 Plan and states she is following her eating plan approximately 50% of the time. Natalie Hall states she is exercising 0 minutes 0 times per week.  Today's visit was #: 24 Starting weight: 213 lbs Starting date: 08/04/2018 Today's weight: 179 lbs Today's date: 12/16/2019 Total lbs lost to date: 34  Total lbs lost since last in-office visit: 0  Interim History: Natalie Hall's IC today shows RMR of [id number] compared with RMR of 1685 on 08/04/2018. She has had a decrease in muscle mass of 10 lbs and significant reduction in RMR since starting the program.  Subjective:   Uncomplicated asthma, unspecified asthma severity, unspecified whether persistent. Asthma is stable without medication. Natalie Hall reports she had quite severe asthma when she was younger.  Essential hypertension. Blood pressure is well controlled with metoprolol and ramipril. Natalie Hall follows with Cardiology for PSVT and has a history of NSTEMI MI. She had a Cardiology visit yesterday and condition is stable.  BP Readings from Last 3 Encounters:  12/16/19 133/74  12/03/19 109/70  12/02/19 133/73   Lab Results  Component Value Date   CREATININE 0.60 11/17/2019   CREATININE 0.61 03/31/2019   CREATININE 0.60 12/18/2018   Prediabetes. Natalie Hall has a diagnosis of prediabetes based on her elevated HgA1c and was informed this puts her at greater risk of developing diabetes. She continues to work on diet and exercise to decrease her risk of diabetes. She denies nausea or hypoglycemia. Natalie Hall reports polyphagia in the afternoon. She is not on metformin. She spoke with Cardiology and he said metformin is a good option for her.  Lab Results  Component Value Date   HGBA1C 5.6 11/17/2019   Lab Results  Component Value Date   INSULIN 18.5 11/17/2019   INSULIN  15.4 11/20/2018   INSULIN 33.5 (H) 08/04/2018   Assessment/Plan:   Uncomplicated asthma, unspecified asthma severity, unspecified whether persistent. IC was performed today with results as above.  Essential hypertension. Natalie Hall is working on healthy weight loss and exercise to improve blood pressure control. We will watch for signs of hypotension as she continues her lifestyle modifications. Natalie Hall will continue to follow-up with Cardiology and is to take all medications as prescribed.  Prediabetes. Natalie Hall will continue to work on weight loss, exercise, and decreasing simple carbohydrates to help decrease the risk of diabetes. She was given a prescription for metFORMIN (GLUCOPHAGE) 500 MG tablet QD with lunch.  Class 1 Natalie with serious comorbidity and body mass index (BMI) of 30.0 to 30.9 in adult, unspecified Natalie type.  Natalie Hall is currently in the action stage of change. As such, her goal is to continue with weight loss efforts. She has agreed to the Category 1 Plan. Natalie Hall was decreased to Category 1 based on today's IC results.  Exercise goals: Natalie Hall will start walking 3 days per week for 30 minutes and start resistance training 3 times per week. We discussed the importance of exercise, especially resistance training given her loss of 10 lbs of muscle mass.  Behavioral modification strategies: planning for success.  Natalie Hall has agreed to follow-up with our clinic in 2 weeks. She was informed of the importance of frequent follow-up visits to maximize her success with intensive lifestyle modifications for her multiple health conditions.   Objective:   Blood pressure 133/74, pulse 61, temperature 97.8 F (  36.6 C), temperature source Oral, height 5\' 4"  (1.626 m), weight 179 lb (81.2 kg), SpO2 100 %. Body mass index is 30.73 kg/m.  General: Cooperative, alert, well developed, in no acute distress. HEENT: Conjunctivae and lids unremarkable. Cardiovascular: Regular rhythm.  Lungs:  Normal work of breathing. Neurologic: No focal deficits.   Lab Results  Component Value Date   CREATININE 0.60 11/17/2019   BUN 16 11/17/2019   NA 138 11/17/2019   K 4.8 11/17/2019   CL 102 11/17/2019   CO2 23 11/17/2019   Lab Results  Component Value Date   ALT 22 11/17/2019   AST 27 11/17/2019   ALKPHOS 54 11/17/2019   BILITOT 0.3 11/17/2019   Lab Results  Component Value Date   HGBA1C 5.6 11/17/2019   HGBA1C 6.0 03/31/2019   HGBA1C 5.7 (H) 11/20/2018   HGBA1C 5.9 (H) 08/04/2018   Lab Results  Component Value Date   INSULIN 18.5 11/17/2019   INSULIN 15.4 11/20/2018   INSULIN 33.5 (H) 08/04/2018   Lab Results  Component Value Date   TSH 1.78 03/31/2019   Lab Results  Component Value Date   CHOL 110 11/17/2019   HDL 29 (L) 11/17/2019   LDLCALC 59 11/17/2019   LDLDIRECT 96.0 03/11/2018   TRIG 123 11/17/2019   CHOLHDL 4 03/31/2019   Lab Results  Component Value Date   WBC 5.1 03/31/2019   HGB 12.9 03/31/2019   HCT 38.5 03/31/2019   MCV 86.2 03/31/2019   PLT 211.0 03/31/2019   No results found for: IRON, TIBC, FERRITIN  Natalie Behavioral Intervention Documentation for Insurance:   Approximately 15 minutes were spent on the discussion below.  ASK: We discussed the diagnosis of Natalie with Natalie Hall today and Natalie Hall agreed to give [name] permission to discuss Natalie behavioral modification therapy today.  ASSESS: Natalie Hall has the diagnosis of Natalie and her BMI today is 30.8. Natalie Hall is in the action stage of change.   ADVISE: Natalie Hall was educated on the multiple health risks of Natalie as well as the benefit of weight loss to improve her health. She was advised of the need for long term treatment and the importance of lifestyle modifications to improve her current health and to decrease her risk of future health problems.  AGREE: Multiple dietary modification options and treatment options were discussed and Natalie Hall agreed to follow the recommendations  documented in the above note.  ARRANGE: Natalie Hall was educated on the importance of frequent visits to treat Natalie as outlined per CMS and USPSTF guidelines and agreed to schedule her next follow up appointment today.  Attestation Statements:   Reviewed by clinician on day of visit: allergies, medications, problem list, medical history, surgical history, family history, social history, and previous encounter notes.  IMichaelene Song, am acting as Location manager for [employer], FNP   I have reviewed the above documentation for accuracy and completeness, and I agree with the above. -  Georgianne Fick, FNP

## 2019-12-17 ENCOUNTER — Encounter (INDEPENDENT_AMBULATORY_CARE_PROVIDER_SITE_OTHER): Payer: Self-pay | Admitting: Family Medicine

## 2020-01-06 ENCOUNTER — Ambulatory Visit (INDEPENDENT_AMBULATORY_CARE_PROVIDER_SITE_OTHER): Payer: Medicare Other | Admitting: Family Medicine

## 2020-01-06 ENCOUNTER — Encounter (INDEPENDENT_AMBULATORY_CARE_PROVIDER_SITE_OTHER): Payer: Self-pay | Admitting: Family Medicine

## 2020-01-06 ENCOUNTER — Other Ambulatory Visit: Payer: Self-pay

## 2020-01-06 VITALS — BP 132/71 | HR 63 | Temp 97.5°F | Ht 64.0 in | Wt 178.0 lb

## 2020-01-06 DIAGNOSIS — I10 Essential (primary) hypertension: Secondary | ICD-10-CM

## 2020-01-06 DIAGNOSIS — E669 Obesity, unspecified: Secondary | ICD-10-CM | POA: Diagnosis not present

## 2020-01-06 DIAGNOSIS — Z6830 Body mass index (BMI) 30.0-30.9, adult: Secondary | ICD-10-CM

## 2020-01-06 DIAGNOSIS — R7303 Prediabetes: Secondary | ICD-10-CM | POA: Diagnosis not present

## 2020-01-06 MED ORDER — METFORMIN HCL 500 MG PO TABS: 500.0000 mg | ORAL_TABLET | Freq: Every day | ORAL | 0 refills | Status: DC

## 2020-01-06 NOTE — Progress Notes (Signed)
Chief Complaint:   OBESITY Natalie Hall is here to discuss her progress with her obesity treatment plan along with follow-up of her obesity related diagnoses. Natalie Hall is on the Category 1 Plan and the Category 2 Plan and states she is following her eating plan approximately 90% of the time. Natalie Hall states she is walking for 45 minutes 3 times per week.  Today's visit was #: 25 Starting weight: 213 lbs Starting date: 08/04/2018 Today's weight: 178 lbs Today's date: 01/06/2020 Total lbs lost to date: 35 lbs Total lbs lost since last in-office visit: 1 lb  Interim History: Natalie Hall tried to stick to Category 1 but felt weak and switched back to Category 2. IC was repeated at last visit and RMR was decreased 500 calories a day to 1195.  She has increased her activity.  She is eating all of the food on Category 2.  We discussed the low carb plan and she will consider this.  Subjective:   1. Prediabetes Metformin was started at last visit.  She notes no difference in hunger.  A1c was 5.6.  Lab Results  Component Value Date   HGBA1C 5.6 11/17/2019   2. Essential hypertension Well controlled with metoprolol and ramipril.  BP Readings from Last 3 Encounters:  01/06/20 132/71  12/16/19 133/74  12/03/19 109/70   Assessment/Plan:   1. Prediabetes Natalie Hall has agreed to continue metformin, and a refill has been sent to the pharmacy for her today. - metFORMIN (GLUCOPHAGE) 500 MG tablet; Take 1 tablet (500 mg total) by mouth daily with lunch.  Dispense: 30 tablet; Refill: 0  2. Essential hypertension Natalie Hall will continue all of her blood pressure medications.  3. Class 1 obesity with serious comorbidity and body mass index (BMI) of 30.0 to 30.9 in adult, unspecified obesity type Natalie Hall is currently in the action stage of change. As such, her goal is to continue with weight loss efforts. She has agreed to the Category 2 Plan.   Exercise goals: As is and add resistance training 2 times a  week.  Behavioral modification strategies: travel eating strategies and planning for success.  Natalie Hall has agreed to follow-up with our clinic in 3 weeks. She was informed of the importance of frequent follow-up visits to maximize her success with intensive lifestyle modifications for her multiple health conditions.   Objective:   Blood pressure 132/71, pulse 63, temperature (!) 97.5 F (36.4 C), temperature source Oral, height 5\' 4"  (1.626 m), weight 178 lb (80.7 kg), SpO2 98 %. Body mass index is 30.55 kg/m.  General: Cooperative, alert, well developed, in no acute distress. HEENT: Conjunctivae and lids unremarkable. Cardiovascular: Regular rhythm.  Lungs: Normal work of breathing. Neurologic: No focal deficits.   Lab Results  Component Value Date   CREATININE 0.60 11/17/2019   BUN 16 11/17/2019   NA 138 11/17/2019   K 4.8 11/17/2019   CL 102 11/17/2019   CO2 23 11/17/2019   Lab Results  Component Value Date   ALT 22 11/17/2019   AST 27 11/17/2019   ALKPHOS 54 11/17/2019   BILITOT 0.3 11/17/2019   Lab Results  Component Value Date   HGBA1C 5.6 11/17/2019   HGBA1C 6.0 03/31/2019   HGBA1C 5.7 (H) 11/20/2018   HGBA1C 5.9 (H) 08/04/2018   Lab Results  Component Value Date   INSULIN 18.5 11/17/2019   INSULIN 15.4 11/20/2018   INSULIN 33.5 (H) 08/04/2018   Lab Results  Component Value Date   TSH 1.78 03/31/2019  Lab Results  Component Value Date   CHOL 110 11/17/2019   HDL 29 (L) 11/17/2019   LDLCALC 59 11/17/2019   LDLDIRECT 96.0 03/11/2018   TRIG 123 11/17/2019   CHOLHDL 4 03/31/2019   Lab Results  Component Value Date   WBC 5.1 03/31/2019   HGB 12.9 03/31/2019   HCT 38.5 03/31/2019   MCV 86.2 03/31/2019   PLT 211.0 03/31/2019   No results found for: IRON, TIBC, FERRITIN  Obesity Behavioral Intervention Documentation for Insurance:   Approximately 15 minutes were spent on the discussion below.  ASK: We discussed the diagnosis of obesity with  [name] today and Thu agreed to give [name] permission to discuss obesity behavioral modification therapy today.  ASSESS: Shantee has the diagnosis of obesity and her BMI today is 30.6. Yori is in the action stage of change.   ADVISE: Breaja was educated on the multiple health risks of obesity as well as the benefit of weight loss to improve her health. She was advised of the need for long term treatment and the importance of lifestyle modifications to improve her current health and to decrease her risk of future health problems.  AGREE: Multiple dietary modification options and treatment options were discussed and Corenthia agreed to follow the recommendations documented in the above note.  ARRANGE: Kaleya was educated on the importance of frequent visits to treat obesity as outlined per CMS and USPSTF guidelines and agreed to schedule her next follow up appointment today.  Attestation Statements:   Reviewed by clinician on day of visit: allergies, medications, problem list, medical history, surgical history, family history, social history, and previous encounter notes.  I, Water quality scientist, CMA, am acting as Location manager for [employer], FNP-C.  I have reviewed the above documentation for accuracy and completeness, and I agree with the above. -  Georgianne Fick, FNP

## 2020-01-07 ENCOUNTER — Encounter (INDEPENDENT_AMBULATORY_CARE_PROVIDER_SITE_OTHER): Payer: Self-pay | Admitting: Family Medicine

## 2020-01-27 ENCOUNTER — Other Ambulatory Visit: Payer: Self-pay

## 2020-01-27 ENCOUNTER — Ambulatory Visit (INDEPENDENT_AMBULATORY_CARE_PROVIDER_SITE_OTHER): Payer: Medicare Other | Admitting: Family Medicine

## 2020-01-27 ENCOUNTER — Encounter (INDEPENDENT_AMBULATORY_CARE_PROVIDER_SITE_OTHER): Payer: Self-pay | Admitting: Family Medicine

## 2020-01-27 VITALS — BP 125/74 | HR 61 | Temp 97.8°F | Ht 64.0 in | Wt 177.0 lb

## 2020-01-27 DIAGNOSIS — E669 Obesity, unspecified: Secondary | ICD-10-CM | POA: Diagnosis not present

## 2020-01-27 DIAGNOSIS — Z6830 Body mass index (BMI) 30.0-30.9, adult: Secondary | ICD-10-CM

## 2020-01-27 DIAGNOSIS — R7303 Prediabetes: Secondary | ICD-10-CM

## 2020-01-27 DIAGNOSIS — E7849 Other hyperlipidemia: Secondary | ICD-10-CM | POA: Diagnosis not present

## 2020-01-27 MED ORDER — METFORMIN HCL 500 MG PO TABS: 500.0000 mg | ORAL_TABLET | Freq: Every day | ORAL | 0 refills | Status: DC

## 2020-01-27 NOTE — Progress Notes (Signed)
Chief Complaint:   OBESITY Natalie Hall is here to discuss her progress with her obesity treatment plan along with follow-up of her obesity related diagnoses. Kashira is on the Category 2 Plan and states she is following her eating plan approximately 70% of the time. Kaeli states she is walking and doing yard work for 45 minutes 3 times per week.  Today's visit was #: 26 Starting weight: 213 lbs Starting date: 08/04/2018 Today's weight: 177 lbs Today's date: 01/27/2020 Total lbs lost to date: 36 Total lbs lost since last in-office visit: 1  Interim History: Natalie Hall admits to some indulgences over the Easter holiday. Her goal is to get to 155 lbs, but her weight has been plateaued for a few months. She has considered the Low carbohydrate plan but she feels it would require too many changes.  Subjective:   1. Pre-diabetes Natalie Hall notes polyphagia. Last A1c was 5.6. She is on metformin 500 mg q daily.  2. Other hyperlipidemia Natalie Hall's levels are well controlled on Crestor. Her last LDL was at 59. HDL was low at 29.   Assessment/Plan:   1. Pre-diabetes Mackenzye will continue to work on weight loss, exercise, and decreasing simple carbohydrates to help decrease the risk of diabetes. We will refill metformin for 1 month.  - metFORMIN (GLUCOPHAGE) 500 MG tablet; Take 1 tablet (500 mg total) by mouth daily with breakfast.  Dispense: 30 tablet; Refill: 0  2. Other hyperlipidemia Cardiovascular risk and specific lipid/LDL goals reviewed. We discussed several lifestyle modifications today and Tyease will continue Crestor, and continue to work on diet, exercise and weight loss efforts.     3. Class 1 obesity with serious comorbidity and body mass index (BMI) of 30.0 to 30.9 in adult, unspecified obesity type Natalie Hall is currently in the action stage of change. As such, her goal is to continue with weight loss efforts. She has agreed to the Category 1 Plan + 100 calories or the Category 2 Plan.    We discussed the Category 1 plan and Low carbohydrate plan.  Exercise goals: Natalie Hall is to increase walking to 4 days per week.  Behavioral modification strategies: decreasing simple carbohydrates and planning for success.  Natalie Hall has agreed to follow-up with our clinic in 3 weeks. She was informed of the importance of frequent follow-up visits to maximize her success with intensive lifestyle modifications for her multiple health conditions.   Objective:   Blood pressure 125/74, pulse 61, temperature 97.8 F (36.6 C), temperature source Oral, height 5\' 4"  (1.626 m), weight 177 lb (80.3 kg), SpO2 99 %. Body mass index is 30.38 kg/m.  General: Cooperative, alert, well developed, in no acute distress. HEENT: Conjunctivae and lids unremarkable. Cardiovascular: Regular rhythm.  Lungs: Normal work of breathing. Neurologic: No focal deficits.   Lab Results  Component Value Date   CREATININE 0.60 11/17/2019   BUN 16 11/17/2019   NA 138 11/17/2019   K 4.8 11/17/2019   CL 102 11/17/2019   CO2 23 11/17/2019   Lab Results  Component Value Date   ALT 22 11/17/2019   AST 27 11/17/2019   ALKPHOS 54 11/17/2019   BILITOT 0.3 11/17/2019   Lab Results  Component Value Date   HGBA1C 5.6 11/17/2019   HGBA1C 6.0 03/31/2019   HGBA1C 5.7 (H) 11/20/2018   HGBA1C 5.9 (H) 08/04/2018   Lab Results  Component Value Date   INSULIN 18.5 11/17/2019   INSULIN 15.4 11/20/2018   INSULIN 33.5 (H) 08/04/2018   Lab Results  Component Value Date   TSH 1.78 03/31/2019   Lab Results  Component Value Date   CHOL 110 11/17/2019   HDL 29 (L) 11/17/2019   LDLCALC 59 11/17/2019   LDLDIRECT 96.0 03/11/2018   TRIG 123 11/17/2019   CHOLHDL 4 03/31/2019   Lab Results  Component Value Date   WBC 5.1 03/31/2019   HGB 12.9 03/31/2019   HCT 38.5 03/31/2019   MCV 86.2 03/31/2019   PLT 211.0 03/31/2019   No results found for: IRON, TIBC, FERRITIN  Obesity Behavioral Intervention Documentation for  Insurance:   Approximately 15 minutes were spent on the discussion below.  ASK: We discussed the diagnosis of obesity with Natalie Hall today and Latha agreed to give [name] permission to discuss obesity behavioral modification therapy today.  ASSESS: Suanne has the diagnosis of obesity and her BMI today is 30.37. Natalie Hall is in the action stage of change.   ADVISE: Natalie Hall was educated on the multiple health risks of obesity as well as the benefit of weight loss to improve her health. She was advised of the need for long term treatment and the importance of lifestyle modifications to improve her current health and to decrease her risk of future health problems.  AGREE: Multiple dietary modification options and treatment options were discussed and Natalie Hall agreed to follow the recommendations documented in the above note.  ARRANGE: Natalie Hall was educated on the importance of frequent visits to treat obesity as outlined per CMS and USPSTF guidelines and agreed to schedule her next follow up appointment today.  Attestation Statements:   Reviewed by clinician on day of visit: allergies, medications, problem list, medical history, surgical history, family history, social history, and previous encounter notes.   Natalie Hall, am acting as Location manager for [employer], FNP-C.  I have reviewed the above documentation for accuracy and completeness, and I agree with the above. -  Georgianne Fick, FNP

## 2020-02-10 LAB — HM MAMMOGRAPHY

## 2020-02-16 ENCOUNTER — Encounter: Payer: Self-pay | Admitting: Family Medicine

## 2020-02-17 ENCOUNTER — Ambulatory Visit (INDEPENDENT_AMBULATORY_CARE_PROVIDER_SITE_OTHER): Payer: Medicare Other | Admitting: Family Medicine

## 2020-02-18 ENCOUNTER — Telehealth: Payer: Self-pay | Admitting: Family Medicine

## 2020-02-18 NOTE — Telephone Encounter (Signed)
Pt states she gets this rash on her legs every spring/summmer and Dr. Sarajane Jews usually gives her predisone 10mg  four times a day then lowers the dosage down. Pt would like to know if he can call something in.  Offered pt to set up an appt but b/c Sarajane Jews is booked tomorrow Pt declined appt for next week or with another provider.   Pharmacy: [facility]: [phone]   Pt can be reached at [phone]

## 2020-02-19 MED ORDER — METHYLPREDNISOLONE 4 MG PO TBPK: ORAL_TABLET | ORAL | 0 refills | Status: DC

## 2020-02-19 NOTE — Telephone Encounter (Signed)
Call in a 4 mg Medrol dose pack  

## 2020-02-19 NOTE — Telephone Encounter (Signed)
Rx sent in. Patient is aware.  

## 2020-02-19 NOTE — Addendum Note (Signed)
Addended by: Rebecca Eaton on: 02/19/2020 02:30 PM   Modules accepted: Orders

## 2020-02-25 ENCOUNTER — Ambulatory Visit (INDEPENDENT_AMBULATORY_CARE_PROVIDER_SITE_OTHER): Payer: Medicare Other | Admitting: Family Medicine

## 2020-02-25 ENCOUNTER — Encounter (INDEPENDENT_AMBULATORY_CARE_PROVIDER_SITE_OTHER): Payer: Self-pay | Admitting: Family Medicine

## 2020-02-25 ENCOUNTER — Other Ambulatory Visit: Payer: Self-pay

## 2020-02-25 VITALS — BP 113/68 | HR 62 | Temp 97.7°F | Ht 64.0 in | Wt 176.0 lb

## 2020-02-25 DIAGNOSIS — Z6830 Body mass index (BMI) 30.0-30.9, adult: Secondary | ICD-10-CM

## 2020-02-25 DIAGNOSIS — R7303 Prediabetes: Secondary | ICD-10-CM

## 2020-02-25 DIAGNOSIS — I10 Essential (primary) hypertension: Secondary | ICD-10-CM

## 2020-02-25 DIAGNOSIS — E669 Obesity, unspecified: Secondary | ICD-10-CM | POA: Diagnosis not present

## 2020-02-25 MED ORDER — METFORMIN HCL 500 MG PO TABS: 500.0000 mg | ORAL_TABLET | Freq: Every day | ORAL | 0 refills | Status: DC

## 2020-02-25 NOTE — Progress Notes (Signed)
Chief Complaint:   OBESITY Natalie Hall is here to discuss her progress with her obesity treatment plan along with follow-up of her obesity related diagnoses. Natalie Hall is on the Category 1 Plan +100 calories or the Category 2 Plan and states she is following her eating plan approximately 90% of the time. Natalie Hall states she is walking for 30 minutes 3 times per week.  Today's visit was #: 14 Starting weight: 213 lbs Starting date: 08/04/2018 Today's weight: 176 lbs Today's date: 02/25/2020 Total lbs lost to date: 37 Total lbs lost since last in-office visit: 1  Interim History: Natalie Hall has been taking prednisone for a rash which she believes has caused some fluid retention. She is eating all of the food on the plan. She reports she had gained some weight, but then lost it in the past 4 weeks since her last office visit. Natalie Hall still wants to lose to 150 lbs (25 BMI) but I advised her that 165 (28 BMI) would be a better weight for her.   Subjective:   1. Pre-diabetes Lira notes decreased hunger with metformin. She is more compliant wit taking it because she moved it to breakfast.  Lab Results  Component Value Date   HGBA1C 5.6 11/17/2019    2. Essential hypertension Natalie Hall's blood pressure is well controlled on metoprolol and ramipril. BP Readings from Last 3 Encounters:  02/25/20 113/68  01/27/20 125/74  01/06/20 132/71    Assessment/Plan:   1. Pre-diabetes Natalie Hall will continue to work on weight loss, exercise, and decreasing simple carbohydrates to help decrease the risk of diabetes. We will refill metformin for 1 month.  - metFORMIN (GLUCOPHAGE) 500 MG tablet; Take 1 tablet (500 mg total) by mouth daily with breakfast.  Dispense: 30 tablet; Refill: 0  2. Essential hypertension Natalie Hall will continue her medications, and will continue to work on healthy weight loss and exercise to improve blood pressure control. We will watch for signs of hypotension as she continues her  lifestyle modifications.  3. Class 1 obesity with serious comorbidity and body mass index (BMI) of 30.0 to 30.9 in adult, unspecified obesity type Natalie Hall is currently in the action stage of change. As such, her goal is to continue with weight loss efforts. She has agreed to the Category 1 Plan or the Category 2 Plan.  We discussed that losing weight will require less calories and more exercise as weight has been mostly plateaued for a while. .  Exercise goals: Natalie Hall will increase walking to 4 times per week and use weights 2 times per week.  Behavioral modification strategies: decreasing simple carbohydrates and planning for success.  Natalie Hall has agreed to follow-up with our clinic in 3 weeks. She was informed of the importance of frequent follow-up visits to maximize her success with intensive lifestyle modifications for her multiple health conditions.   Objective:   Blood pressure 113/68, pulse 62, temperature 97.7 F (36.5 C), temperature source Oral, height 5\' 4"  (1.626 m), weight 176 lb (79.8 kg), SpO2 98 %. Body mass index is 30.21 kg/m.  General: Cooperative, alert, well developed, in no acute distress. HEENT: Conjunctivae and lids unremarkable. Cardiovascular: Regular rhythm.  Lungs: Normal work of breathing. Neurologic: No focal deficits.   Lab Results  Component Value Date   CREATININE 0.60 11/17/2019   BUN 16 11/17/2019   NA 138 11/17/2019   K 4.8 11/17/2019   CL 102 11/17/2019   CO2 23 11/17/2019   Lab Results  Component Value Date   ALT  22 11/17/2019   AST 27 11/17/2019   ALKPHOS 54 11/17/2019   BILITOT 0.3 11/17/2019   Lab Results  Component Value Date   HGBA1C 5.6 11/17/2019   HGBA1C 6.0 03/31/2019   HGBA1C 5.7 (H) 11/20/2018   HGBA1C 5.9 (H) 08/04/2018   Lab Results  Component Value Date   INSULIN 18.5 11/17/2019   INSULIN 15.4 11/20/2018   INSULIN 33.5 (H) 08/04/2018   Lab Results  Component Value Date   TSH 1.78 03/31/2019   Lab Results    Component Value Date   CHOL 110 11/17/2019   HDL 29 (L) 11/17/2019   LDLCALC 59 11/17/2019   LDLDIRECT 96.0 03/11/2018   TRIG 123 11/17/2019   CHOLHDL 4 03/31/2019   Lab Results  Component Value Date   WBC 5.1 03/31/2019   HGB 12.9 03/31/2019   HCT 38.5 03/31/2019   MCV 86.2 03/31/2019   PLT 211.0 03/31/2019   No results found for: IRON, TIBC, FERRITIN  Obesity Behavioral Intervention Documentation for Insurance:   Approximately 15 minutes were spent on the discussion below.  ASK: We discussed the diagnosis of obesity with Natalie Hall today and Natalie Hall agreed to give [name] permission to discuss obesity behavioral modification therapy today.  ASSESS: Natalie Hall has the diagnosis of obesity and her BMI today is 30.2. Natalie Hall is in the action stage of change.   ADVISE: Natalie Hall was educated on the multiple health risks of obesity as well as the benefit of weight loss to improve her health. She was advised of the need for long term treatment and the importance of lifestyle modifications to improve her current health and to decrease her risk of future health problems.  AGREE: Multiple dietary modification options and treatment options were discussed and Natalie Hall agreed to follow the recommendations documented in the above note.  ARRANGE: Natalie Hall was educated on the importance of frequent visits to treat obesity as outlined per CMS and USPSTF guidelines and agreed to schedule her next follow up appointment today.  Attestation Statements:   Reviewed by clinician on day of visit: allergies, medications, problem list, medical history, surgical history, family history, social history, and previous encounter notes.   [name], am acting as Location manager for [employer], FNP-C.  I have reviewed the above documentation for accuracy and completeness, and I agree with the above. -  Georgianne Fick, FNP

## 2020-02-29 ENCOUNTER — Other Ambulatory Visit: Payer: Self-pay | Admitting: Family Medicine

## 2020-03-01 NOTE — Telephone Encounter (Signed)
Last filled 09/21/2019 Last OV 03/31/2019  Ok to fill?

## 2020-03-03 ENCOUNTER — Other Ambulatory Visit: Payer: Self-pay

## 2020-03-03 ENCOUNTER — Ambulatory Visit (INDEPENDENT_AMBULATORY_CARE_PROVIDER_SITE_OTHER): Payer: Medicare Other | Admitting: Podiatry

## 2020-03-03 VITALS — Temp 97.3°F

## 2020-03-03 DIAGNOSIS — M21961 Unspecified acquired deformity of right lower leg: Secondary | ICD-10-CM

## 2020-03-03 DIAGNOSIS — M7751 Other enthesopathy of right foot: Secondary | ICD-10-CM | POA: Diagnosis not present

## 2020-03-17 ENCOUNTER — Other Ambulatory Visit: Payer: Self-pay

## 2020-03-17 ENCOUNTER — Encounter (INDEPENDENT_AMBULATORY_CARE_PROVIDER_SITE_OTHER): Payer: Self-pay | Admitting: Family Medicine

## 2020-03-17 ENCOUNTER — Ambulatory Visit (INDEPENDENT_AMBULATORY_CARE_PROVIDER_SITE_OTHER): Payer: Medicare Other | Admitting: Family Medicine

## 2020-03-17 VITALS — BP 123/73 | HR 59 | Temp 97.7°F | Ht 64.0 in | Wt 177.0 lb

## 2020-03-17 DIAGNOSIS — E8881 Metabolic syndrome: Secondary | ICD-10-CM

## 2020-03-17 DIAGNOSIS — E669 Obesity, unspecified: Secondary | ICD-10-CM | POA: Diagnosis not present

## 2020-03-17 DIAGNOSIS — Z6830 Body mass index (BMI) 30.0-30.9, adult: Secondary | ICD-10-CM | POA: Diagnosis not present

## 2020-03-17 NOTE — Progress Notes (Signed)
Chief Complaint:   OBESITY Maija is here to discuss her progress with her obesity treatment plan along with follow-up of her obesity related diagnoses. [name] is on the Category 1 Plan or the Category 2 Plan and states she is following her eating plan approximately 70-75% of the time. Denim states she is doing 0 minutes 0 times per week.  Today's visit was #: 28 Starting weight: 213 lbs Starting date: 08/04/2018 Today's weight: 177 lbs Today's date: 03/17/2020 Total lbs lost to date: 36 Total lbs lost since last in-office visit: 0  Interim History: Molley is mostly following her Category 1 plan. She is not eating all of the protein at lunch. She has not been exercising. She is down 36 lbs but weight is now plateaued for the most part.   Subjective:   1. Insulin resistance Jatziri has a diagnosis of insulin resistance based on her elevated fasting insulin level >5. She denies polyphagia. She continues to work on diet and exercise to decrease her risk of diabetes.  Lab Results  Component Value Date   INSULIN 18.5 11/17/2019   INSULIN 15.4 11/20/2018   INSULIN 33.5 (H) 08/04/2018   Lab Results  Component Value Date   HGBA1C 5.6 11/17/2019   Assessment/Plan:   1. Insulin resistance Tavin will continue metformin, and will continue to work on weight loss, exercise, and decreasing simple carbohydrates to help decrease the risk of diabetes. Jewelisa agreed to follow-up with [name] as directed to closely monitor her progress.  2. Class 1 obesity with serious comorbidity and body mass index (BMI) of 30.0 to 30.9 in adult, unspecified obesity type Alys is currently in the action stage of change. As such, her goal is to continue with weight loss efforts. She has agreed to the Category 1 Plan or the Category 2 Plan.   Handout given today: Protein Exchanges. Elajah may have 2 oz of meat on a sandwich at lunch and have a protein exchange snack..  Exercise goals: I encouraged Cybill to  check into rejoining [employer].  We discussed that exercise may help her break her weight plateau.   Behavioral modification strategies: increasing lean protein intake.  Natonya has agreed to follow-up with our clinic in 2 to 3 weeks. She was informed of the importance of frequent follow-up visits to maximize her success with intensive lifestyle modifications for her multiple health conditions.   Objective:   Blood pressure 123/73, pulse (!) 59, temperature 97.7 F (36.5 C), temperature source Oral, height 5\' 4"  (1.626 m), weight 177 lb (80.3 kg), SpO2 97 %. Body mass index is 30.38 kg/m.  General: Cooperative, alert, well developed, in no acute distress. HEENT: Conjunctivae and lids unremarkable. Cardiovascular: Regular rhythm.  Lungs: Normal work of breathing. Neurologic: No focal deficits.   Lab Results  Component Value Date   CREATININE 0.60 11/17/2019   BUN 16 11/17/2019   NA 138 11/17/2019   K 4.8 11/17/2019   CL 102 11/17/2019   CO2 23 11/17/2019   Lab Results  Component Value Date   ALT 22 11/17/2019   AST 27 11/17/2019   ALKPHOS 54 11/17/2019   BILITOT 0.3 11/17/2019   Lab Results  Component Value Date   HGBA1C 5.6 11/17/2019   HGBA1C 6.0 03/31/2019   HGBA1C 5.7 (H) 11/20/2018   HGBA1C 5.9 (H) 08/04/2018   Lab Results  Component Value Date   INSULIN 18.5 11/17/2019   INSULIN 15.4 11/20/2018   INSULIN 33.5 (H) 08/04/2018   Lab Results  Component Value Date   TSH 1.78 03/31/2019   Lab Results  Component Value Date   CHOL 110 11/17/2019   HDL 29 (L) 11/17/2019   LDLCALC 59 11/17/2019   LDLDIRECT 96.0 03/11/2018   TRIG 123 11/17/2019   CHOLHDL 4 03/31/2019   Lab Results  Component Value Date   WBC 5.1 03/31/2019   HGB 12.9 03/31/2019   HCT 38.5 03/31/2019   MCV 86.2 03/31/2019   PLT 211.0 03/31/2019   No results found for: IRON, TIBC, FERRITIN  Obesity Behavioral Intervention Documentation for Insurance:   Approximately 15 minutes  were spent on the discussion below.  ASK: We discussed the diagnosis of obesity with [name] today and Ranyia agreed to give [name] permission to discuss obesity behavioral modification therapy today.  ASSESS: Argelia has the diagnosis of obesity and her BMI today is 30.37. Shellye is in the action stage of change.   ADVISE: Manami was educated on the multiple health risks of obesity as well as the benefit of weight loss to improve her health. She was advised of the need for long term treatment and the importance of lifestyle modifications to improve her current health and to decrease her risk of future health problems.  AGREE: Multiple dietary modification options and treatment options were discussed and Bethanee agreed to follow the recommendations documented in the above note.  ARRANGE: Mathea was educated on the importance of frequent visits to treat obesity as outlined per CMS and USPSTF guidelines and agreed to schedule her next follow up appointment today.  Attestation Statements:   Reviewed by clinician on day of visit: allergies, medications, problem list, medical history, surgical history, family history, social history, and previous encounter notes.   [name], am acting as Location manager for [employer], FNP-C.  I have reviewed the above documentation for accuracy and completeness, and I agree with the above. -  Georgianne Fick, FNP

## 2020-03-30 ENCOUNTER — Other Ambulatory Visit: Payer: Self-pay

## 2020-03-31 ENCOUNTER — Other Ambulatory Visit (INDEPENDENT_AMBULATORY_CARE_PROVIDER_SITE_OTHER): Payer: Medicare Other

## 2020-03-31 ENCOUNTER — Ambulatory Visit (INDEPENDENT_AMBULATORY_CARE_PROVIDER_SITE_OTHER): Payer: Medicare Other | Admitting: Family Medicine

## 2020-03-31 ENCOUNTER — Telehealth: Payer: Self-pay | Admitting: Family Medicine

## 2020-03-31 ENCOUNTER — Encounter: Payer: Self-pay | Admitting: Family Medicine

## 2020-03-31 VITALS — BP 122/60 | HR 71 | Temp 97.7°F | Wt 182.2 lb

## 2020-03-31 DIAGNOSIS — E559 Vitamin D deficiency, unspecified: Secondary | ICD-10-CM

## 2020-03-31 DIAGNOSIS — E785 Hyperlipidemia, unspecified: Secondary | ICD-10-CM

## 2020-03-31 DIAGNOSIS — M8580 Other specified disorders of bone density and structure, unspecified site: Secondary | ICD-10-CM | POA: Diagnosis not present

## 2020-03-31 DIAGNOSIS — K219 Gastro-esophageal reflux disease without esophagitis: Secondary | ICD-10-CM

## 2020-03-31 DIAGNOSIS — R7303 Prediabetes: Secondary | ICD-10-CM

## 2020-03-31 DIAGNOSIS — I471 Supraventricular tachycardia, unspecified: Secondary | ICD-10-CM

## 2020-03-31 DIAGNOSIS — C6992 Malignant neoplasm of unspecified site of left eye: Secondary | ICD-10-CM

## 2020-03-31 DIAGNOSIS — E669 Obesity, unspecified: Secondary | ICD-10-CM

## 2020-03-31 DIAGNOSIS — Z6830 Body mass index (BMI) 30.0-30.9, adult: Secondary | ICD-10-CM

## 2020-03-31 LAB — BASIC METABOLIC PANEL
BUN: 23 mg/dL (ref 6–23)
CO2: 28 mEq/L (ref 19–32)
Calcium: 9.7 mg/dL (ref 8.4–10.5)
Chloride: 100 mEq/L (ref 96–112)
Creatinine, Ser: 0.72 mg/dL (ref 0.40–1.20)
GFR: 80.1 mL/min (ref >60.00)
Glucose, Bld: 91 mg/dL (ref 70–99)
Potassium: 4.3 mEq/L (ref 3.5–5.1)
Sodium: 135 mEq/L (ref 135–145)

## 2020-03-31 LAB — CBC WITH DIFFERENTIAL/PLATELET
Basophils Absolute: 0 10*3/uL (ref 0.0–0.1)
Basophils Relative: 0.5 % (ref 0.0–3.0)
Eosinophils Absolute: 0.3 10*3/uL (ref 0.0–0.7)
Eosinophils Relative: 5.7 % — ABNORMAL HIGH (ref 0.0–5.0)
HCT: 38.4 % (ref 36.0–46.0)
Hemoglobin: 13 g/dL (ref 12.0–15.0)
Lymphocytes Relative: 30.6 % (ref 12.0–46.0)
Lymphs Abs: 1.6 10*3/uL (ref 0.7–4.0)
MCHC: 33.8 g/dL (ref 30.0–36.0)
MCV: 86.4 fl (ref 78.0–100.0)
Monocytes Absolute: 0.5 10*3/uL (ref 0.1–1.0)
Monocytes Relative: 9.8 % (ref 3.0–12.0)
Neutro Abs: 2.9 10*3/uL (ref 1.4–7.7)
Neutrophils Relative %: 53.4 % (ref 43.0–77.0)
Platelets: 170 10*3/uL (ref 150.0–400.0)
RBC: 4.45 Mil/uL (ref 3.87–5.11)
RDW: 14.4 % (ref 11.5–15.5)
WBC: 5.3 10*3/uL (ref 4.0–10.5)

## 2020-03-31 LAB — HEPATIC FUNCTION PANEL
ALT: 22 U/L (ref 0–35)
AST: 25 U/L (ref 0–37)
Albumin: 4.2 g/dL (ref 3.5–5.2)
Alkaline Phosphatase: 42 U/L (ref 39–117)
Bilirubin, Direct: 0.1 mg/dL (ref 0.0–0.3)
Total Bilirubin: 0.5 mg/dL (ref 0.2–1.2)
Total Protein: 7.6 g/dL (ref 6.0–8.3)

## 2020-03-31 LAB — LIPID PANEL
Cholesterol: 115 mg/dL (ref 0–200)
HDL: 29.1 mg/dL — ABNORMAL LOW (ref >39.00)
LDL Cholesterol: 57 mg/dL (ref 0–99)
NonHDL: 85.71
Total CHOL/HDL Ratio: 4
Triglycerides: 142 mg/dL (ref 0.0–149.0)
VLDL: 28.4 mg/dL (ref 0.0–40.0)

## 2020-03-31 LAB — TSH: TSH: 1.57 u[IU]/mL (ref 0.35–4.50)

## 2020-03-31 LAB — VITAMIN D 25 HYDROXY (VIT D DEFICIENCY, FRACTURES): VITD: 105.1 ng/mL (ref 30.00–100.00)

## 2020-03-31 MED ORDER — OMEPRAZOLE 40 MG PO CPDR: 40.0000 mg | DELAYED_RELEASE_CAPSULE | Freq: Every day | ORAL | 3 refills | Status: DC

## 2020-03-31 NOTE — Progress Notes (Signed)
Subjective:    Patient ID: Natalie Hall, female    DOB: 1951-08-31, 69 y.o.   MRN: [id number]  HPI Here to follow up on issues. She feels fine and has no concerns. [facility] to monitor her obesity. She has been able to lose 40 lbs over the past year. They have asked [name] to check insulin levels with the rest of our labs today. She sees Dr. 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    Review of Systems  Constitutional: Negative.   HENT: Negative.   Eyes: Negative.   Respiratory: Negative.   Cardiovascular: Negative.   Gastrointestinal: Negative.   Genitourinary: Negative for decreased urine volume, difficulty urinating, dyspareunia, dysuria, enuresis, flank pain, frequency, hematuria, pelvic pain and urgency.  Musculoskeletal: Negative.   Skin: Negative.   Neurological: Negative.   Psychiatric/Behavioral: Negative.        Objective:   Physical Exam Constitutional:      General: She is not in acute distress.    Appearance: She is well-developed.  HENT:     Head: Normocephalic and atraumatic.     Right Ear: External ear normal.     Left Ear: External ear normal.     Nose: Nose normal.     Mouth/Throat:     Pharynx: No oropharyngeal exudate.  Eyes:     General: No scleral icterus.    Conjunctiva/sclera: Conjunctivae normal.     Pupils: Pupils are equal, round, and reactive to light.  Neck:     Thyroid: No thyromegaly.     Vascular: No JVD.  Cardiovascular:     Rate and Rhythm: Normal rate and regular rhythm.     Heart sounds: Normal heart sounds. No murmur heard.  No friction rub. No gallop.   Pulmonary:     Effort: Pulmonary effort is normal. No respiratory distress.     Breath sounds: Normal breath sounds. No wheezing or rales.  Chest:     Chest Hawkey: No tenderness.  Abdominal:     General: Bowel sounds are normal. There is no distension.       Palpations: Abdomen is soft. There is no mass.     Tenderness: There is no abdominal tenderness. There is no guarding or rebound.  Musculoskeletal:        General: No tenderness. Normal range of motion.     Cervical back: Normal range of motion and neck supple.  Lymphadenopathy:     Cervical: No cervical adenopathy.  Skin:    General: Skin is warm and dry.     Findings: No erythema or rash.  Neurological:     Mental Status: She is alert and oriented to person, place, and time.     Cranial Nerves: No cranial nerve deficit.     Motor: No abnormal muscle tone.     Coordination: Coordination normal.     Deep Tendon Reflexes: Reflexes are normal and symmetric. Reflexes normal.  Psychiatric:        Behavior: Behavior normal.        Thought Content: Thought content normal.        Judgment: Judgment normal.           Assessment & Plan:  She seems to be doing well overall. Get fasting labs to check lipids, A1c, insulin levels, etc. Set up a CXR and  an [name].  Alysia Penna, MD

## 2020-03-31 NOTE — Telephone Encounter (Signed)
Received a call from [facility] lab.  Pt's vitamin D level is critical level at 105.10.  Will send to Dr. Sarajane Jews.

## 2020-03-31 NOTE — Telephone Encounter (Signed)
Noted  

## 2020-04-01 LAB — HEMOGLOBIN A1C: Hgb A1c MFr Bld: 6 % (ref 4.6–6.5)

## 2020-04-04 LAB — INSULIN, FREE AND TOTAL
Free Insulin: 8.8 uU/mL
Total Insulin: 9.4 uU/mL

## 2020-04-05 ENCOUNTER — Telehealth: Payer: Self-pay | Admitting: Family Medicine

## 2020-04-05 NOTE — Telephone Encounter (Signed)
Pt was returning Lindsay's call about results. Pt will not be back home till after 3:30pm today and she can be reached at [phone]

## 2020-04-05 NOTE — Telephone Encounter (Signed)
Noted. Please see result notes for further documentation.

## 2020-04-07 ENCOUNTER — Ambulatory Visit
Admission: RE | Admit: 2020-04-07 | Discharge: 2020-04-07 | Disposition: A | Discharge status: Home / Self Care | Payer: Medicare Other | Source: Ambulatory Visit | Attending: Family Medicine | Admitting: Family Medicine

## 2020-04-07 DIAGNOSIS — C6992 Malignant neoplasm of unspecified site of left eye: Secondary | ICD-10-CM

## 2020-04-07 NOTE — Telephone Encounter (Signed)
Pt returning [place] phone call for test results.

## 2020-04-08 ENCOUNTER — Other Ambulatory Visit: Payer: Self-pay | Admitting: Internal Medicine

## 2020-04-08 DIAGNOSIS — E782 Mixed hyperlipidemia: Secondary | ICD-10-CM

## 2020-04-08 DIAGNOSIS — I10 Essential (primary) hypertension: Secondary | ICD-10-CM

## 2020-04-08 NOTE — Telephone Encounter (Signed)
Please see result notes for further documentation.

## 2020-04-11 ENCOUNTER — Ambulatory Visit (INDEPENDENT_AMBULATORY_CARE_PROVIDER_SITE_OTHER): Payer: Medicare Other | Admitting: Family Medicine

## 2020-04-13 ENCOUNTER — Encounter: Payer: Self-pay | Admitting: Family Medicine

## 2020-04-15 NOTE — Telephone Encounter (Signed)
Pt is returning call. Thanks    

## 2020-04-18 ENCOUNTER — Encounter (INDEPENDENT_AMBULATORY_CARE_PROVIDER_SITE_OTHER): Payer: Self-pay | Admitting: Family Medicine

## 2020-04-18 ENCOUNTER — Ambulatory Visit (INDEPENDENT_AMBULATORY_CARE_PROVIDER_SITE_OTHER): Payer: Medicare Other | Admitting: Family Medicine

## 2020-04-18 ENCOUNTER — Other Ambulatory Visit: Payer: Self-pay

## 2020-04-18 VITALS — BP 120/74 | HR 67 | Temp 97.6°F | Ht 64.0 in | Wt 179.0 lb

## 2020-04-18 DIAGNOSIS — E669 Obesity, unspecified: Secondary | ICD-10-CM

## 2020-04-18 DIAGNOSIS — E559 Vitamin D deficiency, unspecified: Secondary | ICD-10-CM

## 2020-04-18 DIAGNOSIS — R7303 Prediabetes: Secondary | ICD-10-CM | POA: Diagnosis not present

## 2020-04-18 DIAGNOSIS — Z6830 Body mass index (BMI) 30.0-30.9, adult: Secondary | ICD-10-CM | POA: Diagnosis not present

## 2020-04-18 MED ORDER — METFORMIN HCL 500 MG PO TABS: 500.0000 mg | ORAL_TABLET | Freq: Every day | ORAL | 0 refills | Status: DC

## 2020-04-19 NOTE — Telephone Encounter (Signed)
She can keep taking the multivitamin, but decrease the other vitamin E from 2000 units to 1000 units a day

## 2020-04-19 NOTE — Progress Notes (Signed)
Chief Complaint:   OBESITY Natalie Hall is here to discuss her progress with her obesity treatment plan along with follow-up of her obesity related diagnoses. Natalie Hall is on the Category 1 Plan or the Category 2 Plan and states she is following her eating plan approximately 50% of the time. Natalie Hall states she is walking and doing yard work for 20 minutes 3 times per week.  Today's visit was #: 28 Starting weight: 213 lbs Starting date: 08/04/2018 Today's weight: 179 lbs Today's date: 04/18/2020 Total lbs lost to date: 34 Total lbs lost since last in-office visit: 0  Interim History: Kharisma notes eating more at social functions recently.  She may not be getting all of her protein in. She notes hunger on Category 1 so she has been following cat 2. Her weight has been plateaued for a year now.   Subjective:   1. Vitamin D deficiency Natalie Hall's Vit D level is over-replaced at 105.10. She is taking Vit D3 2,000 IU daily plus calcium with D 2 times per day.  2. Pre-diabetes Natalie Hall has a diagnosis of pre-diabetes based on her elevated Hgb A1c and was informed this puts her at greater risk of developing diabetes. She denies polyphagia on metformin. She continues to work on diet and exercise to decrease her risk of diabetes. She denies nausea or hypoglycemia. A1c has increased from 5.6 to 6 over the last 5 months. She is on metformin 500 mg once daily.   Lab Results  Component Value Date   HGBA1C 6.0 03/31/2020   Lab Results  Component Value Date   INSULIN 18.5 11/17/2019   INSULIN 15.4 11/20/2018   INSULIN 33.5 (H) 08/04/2018   Assessment/Plan:   1. Vitamin D deficiency . Natalie Hall agreed to discontinue Vit D3 2000 IU and will continue Calcium with D  Once daily.   2. Pre-diabetes Natalie Hall will continue to work on weight loss, exercise, and decreasing simple carbohydrates to help decrease the risk of diabetes. We will refill metformin for 90 days with no refills.  - metFORMIN (GLUCOPHAGE)  500 MG tablet; Take 1 tablet (500 mg total) by mouth daily with breakfast.  Dispense: 90 tablet; Refill: 0  3. Class 1 obesity with serious comorbidity and body mass index (BMI) of 30.0 to 30.9 in adult, unspecified obesity type Natalie Hall is currently in the action stage of change. As such, her goal is to continue with weight loss efforts. She has agreed to the Category 2 Plan.   Exercise goals: All adults should avoid inactivity. Some physical activity is better than none, and adults who participate in any amount of physical activity gain some health benefits.  Behavioral modification strategies: increasing lean protein intake and decreasing simple carbohydrates.  Natalie Hall has agreed to follow-up with our clinic in 2 weeks. She was informed of the importance of frequent follow-up visits to maximize her success with intensive lifestyle modifications for her multiple health conditions.   Objective:   Blood pressure 120/74, pulse 67, temperature 97.6 F (36.4 C), temperature source Oral, height 5\' 4"  (1.626 m), weight 179 lb (81.2 kg), SpO2 100 %. Body mass index is 30.73 kg/m.  General: Cooperative, alert, well developed, in no acute distress. HEENT: Conjunctivae and lids unremarkable. Cardiovascular: Regular rhythm.  Lungs: Normal work of breathing. Neurologic: No focal deficits.   Lab Results  Component Value Date   CREATININE 0.72 03/31/2020   BUN 23 03/31/2020   NA 135 03/31/2020   K 4.3 03/31/2020   CL 100 03/31/2020  CO2 28 03/31/2020   Lab Results  Component Value Date   ALT 22 03/31/2020   AST 25 03/31/2020   ALKPHOS 42 03/31/2020   BILITOT 0.5 03/31/2020   Lab Results  Component Value Date   HGBA1C 6.0 03/31/2020   HGBA1C 5.6 11/17/2019   HGBA1C 6.0 03/31/2019   HGBA1C 5.7 (H) 11/20/2018   HGBA1C 5.9 (H) 08/04/2018   Lab Results  Component Value Date   INSULIN 18.5 11/17/2019   INSULIN 15.4 11/20/2018   INSULIN 33.5 (H) 08/04/2018   Lab Results  Component  Value Date   TSH 1.57 03/31/2020   Lab Results  Component Value Date   CHOL 115 03/31/2020   HDL 29.10 (L) 03/31/2020   LDLCALC 57 03/31/2020   LDLDIRECT 96.0 03/11/2018   TRIG 142.0 03/31/2020   CHOLHDL 4 03/31/2020   Lab Results  Component Value Date   WBC 5.3 03/31/2020   HGB 13.0 03/31/2020   HCT 38.4 03/31/2020   MCV 86.4 03/31/2020   PLT 170.0 03/31/2020   No results found for: IRON, TIBC, FERRITIN  Obesity Behavioral Intervention Documentation for Insurance:   Approximately 15 minutes were spent on the discussion below.  ASK: We discussed the diagnosis of obesity with Natalie Hall today and Natalie Hall agreed to give [name] permission to discuss obesity behavioral modification therapy today.  ASSESS: Natalie Hall has the diagnosis of obesity and her BMI today is 30.71. Natalie Hall is in the action stage of change.   ADVISE: Natalie Hall was educated on the multiple health risks of obesity as well as the benefit of weight loss to improve her health. She was advised of the need for long term treatment and the importance of lifestyle modifications to improve her current health and to decrease her risk of future health problems.  AGREE: Multiple dietary modification options and treatment options were discussed and Natalie Hall agreed to follow the recommendations documented in the above note.  ARRANGE: Natalie Hall was educated on the importance of frequent visits to treat obesity as outlined per CMS and USPSTF guidelines and agreed to schedule her next follow up appointment today.  Attestation Statements:   Reviewed by clinician on day of visit: allergies, medications, problem list, medical history, surgical history, family history, social history, and previous encounter notes.   [name], am acting as Location manager for [employer], FNP-C.  I have reviewed the above documentation for accuracy and completeness, and I agree with the above. -  Georgianne Fick, FNP

## 2020-04-20 ENCOUNTER — Encounter (INDEPENDENT_AMBULATORY_CARE_PROVIDER_SITE_OTHER): Payer: Self-pay | Admitting: Family Medicine

## 2020-05-03 ENCOUNTER — Ambulatory Visit (INDEPENDENT_AMBULATORY_CARE_PROVIDER_SITE_OTHER): Payer: Medicare Other | Admitting: Family Medicine

## 2020-05-03 ENCOUNTER — Other Ambulatory Visit: Payer: Self-pay

## 2020-05-03 ENCOUNTER — Encounter (INDEPENDENT_AMBULATORY_CARE_PROVIDER_SITE_OTHER): Payer: Self-pay | Admitting: Family Medicine

## 2020-05-03 VITALS — BP 117/70 | HR 60 | Temp 98.0°F | Ht 64.0 in | Wt 177.0 lb

## 2020-05-03 DIAGNOSIS — Z6830 Body mass index (BMI) 30.0-30.9, adult: Secondary | ICD-10-CM | POA: Diagnosis not present

## 2020-05-03 DIAGNOSIS — R7303 Prediabetes: Secondary | ICD-10-CM | POA: Diagnosis not present

## 2020-05-03 DIAGNOSIS — E669 Obesity, unspecified: Secondary | ICD-10-CM | POA: Diagnosis not present

## 2020-05-04 NOTE — Progress Notes (Signed)
  Subjective:  Patient ID: Natalie Hall, female    DOB: 04/29/1951,  MRN: [id number]  Chief Complaint  Patient presents with  . Skin Problem    ? Callus or plantar wart. R plantar forefoot submet 5. x"Years". Pt stated, "It's painful when I step on it".  . Callouses    R hallux (medial). No pain.    69 y.o. female presents with the above complaint. History confirmed with patient.   Objective:  Physical Exam: warm, good capillary refill, no trophic changes or ulcerative lesions, normal DP and PT pulses and normal sensory exam. Left Foot: normal exam, no swelling, tenderness, instability; ligaments intact, full range of motion of all ankle/foot joints  Right Foot: normal exam, no swelling, tenderness, instability; ligaments intact, full range of motion of all ankle/foot joints. HPK submet 5  Assessment:   1. Capsulitis of metatarsophalangeal (MTP) joint of right foot   2. Metatarsal deformity, right      Plan:  Patient was evaluated and treated and all questions answered.  Capsulitis -Educated on etiology of lesion -Pared courtesy -Educated on self-care -Offloading pad applied and dispensed.  F/u PRN.  No follow-ups on file.

## 2020-05-05 NOTE — Progress Notes (Signed)
Chief Complaint:   OBESITY Natalie Hall is here to discuss her progress with her obesity treatment plan along with follow-up of her obesity related diagnoses. Natalie Hall is on the Category 2 Plan and states she is following her eating plan approximately 90% of the time. Natalie Hall states she is doing 0 minutes 0 times per week.  Today's visit was #: 74 Starting weight: 213 lbs Starting date: 08/04/2018 Today's weight: 177 lbs Today's date: 05/03/2020 Total lbs lost to date: 36 Total lbs lost since last in-office visit: 2  Interim History: Natalie Hall has adhered very closely to the plan over the past few weeks and has lost 2 lbs today. She is not exercising.  Subjective:   1. Pre-diabetes Natalie Hall has a diagnosis of pre-diabetes based on her elevated Hgb A1c and was informed this puts her at greater risk of developing diabetes. Last A1c was 6.0, and she is on metformin. She notes her hunger is satisfied. She continues to work on diet and exercise to decrease her risk of diabetes. She denies nausea or hypoglycemia.  Lab Results  Component Value Date   HGBA1C 6.0 03/31/2020   Lab Results  Component Value Date   INSULIN 18.5 11/17/2019   INSULIN 15.4 11/20/2018   INSULIN 33.5 (H) 08/04/2018   Assessment/Plan:   1. Pre-diabetes Natalie Hall will continue metformin, and will continue to work on weight loss, exercise, and decreasing simple carbohydrates to help decrease the risk of diabetes.   2. Class 1 obesity with serious comorbidity and body mass index (BMI) of 30.0 to 30.9 in adult, unspecified obesity type Natalie Hall is currently in the action stage of change. As such, her goal is to continue with weight loss efforts. She has agreed to the Category 2 Plan.   I strongly encouraged Natalie Hall to start going to the gym.  Exercise goals: All adults should avoid inactivity. Some physical activity is better than none, and adults who participate in any amount of physical activity gain some health  benefits.  Behavioral modification strategies: increasing lean protein intake and decreasing simple carbohydrates.  Natalie Hall has agreed to follow-up with our clinic in 3 weeks. She was informed of the importance of frequent follow-up visits to maximize her success with intensive lifestyle modifications for her multiple health conditions.   Objective:   Blood pressure 117/70, pulse 60, temperature 98 F (36.7 C), temperature source Oral, height 5\' 4"  (1.626 m), weight 177 lb (80.3 kg), SpO2 98 %. Body mass index is 30.38 kg/m.  General: Cooperative, alert, well developed, in no acute distress. HEENT: Conjunctivae and lids unremarkable. Cardiovascular: Regular rhythm.  Lungs: Normal work of breathing. Neurologic: No focal deficits.   Lab Results  Component Value Date   CREATININE 0.72 03/31/2020   BUN 23 03/31/2020   NA 135 03/31/2020   K 4.3 03/31/2020   CL 100 03/31/2020   CO2 28 03/31/2020   Lab Results  Component Value Date   ALT 22 03/31/2020   AST 25 03/31/2020   ALKPHOS 42 03/31/2020   BILITOT 0.5 03/31/2020   Lab Results  Component Value Date   HGBA1C 6.0 03/31/2020   HGBA1C 5.6 11/17/2019   HGBA1C 6.0 03/31/2019   HGBA1C 5.7 (H) 11/20/2018   HGBA1C 5.9 (H) 08/04/2018   Lab Results  Component Value Date   INSULIN 18.5 11/17/2019   INSULIN 15.4 11/20/2018   INSULIN 33.5 (H) 08/04/2018   Lab Results  Component Value Date   TSH 1.57 03/31/2020   Lab Results  Component Value  Date   CHOL 115 03/31/2020   HDL 29.10 (L) 03/31/2020   LDLCALC 57 03/31/2020   LDLDIRECT 96.0 03/11/2018   TRIG 142.0 03/31/2020   CHOLHDL 4 03/31/2020   Lab Results  Component Value Date   WBC 5.3 03/31/2020   HGB 13.0 03/31/2020   HCT 38.4 03/31/2020   MCV 86.4 03/31/2020   PLT 170.0 03/31/2020   No results found for: IRON, TIBC, FERRITIN  Obesity Behavioral Intervention Documentation for Insurance:   Approximately 15 minutes were spent on the discussion  below.  ASK: We discussed the diagnosis of obesity with [name] today and Charlottie agreed to give [name] permission to discuss obesity behavioral modification therapy today.  ASSESS: [name] has the diagnosis of obesity and her BMI today is 30.37. Natalie Hall is in the action stage of change.   ADVISE: Krystalle was educated on the multiple health risks of obesity as well as the benefit of weight loss to improve her health. She was advised of the need for long term treatment and the importance of lifestyle modifications to improve her current health and to decrease her risk of future health problems.  AGREE: Multiple dietary modification options and treatment options were discussed and Shaquavia agreed to follow the recommendations documented in the above note.  ARRANGE: Athleen was educated on the importance of frequent visits to treat obesity as outlined per CMS and USPSTF guidelines and agreed to schedule her next follow up appointment today.  Attestation Statements:   Reviewed by clinician on day of visit: allergies, medications, problem list, medical history, surgical history, family history, social history, and previous encounter notes.   [name], am acting as Location manager for [employer], FNP-C.  I have reviewed the above documentation for accuracy and completeness, and I agree with the above. -  Georgianne Fick, FNP

## 2020-05-06 ENCOUNTER — Encounter (INDEPENDENT_AMBULATORY_CARE_PROVIDER_SITE_OTHER): Payer: Self-pay | Admitting: Family Medicine

## 2020-05-10 ENCOUNTER — Telehealth: Payer: Self-pay | Admitting: Family Medicine

## 2020-05-10 NOTE — Telephone Encounter (Signed)
Left message for patient to schedule Annual Wellness Visit.  Please schedule with Nurse Health Advisor Shannon Crews, RN at [facility]  

## 2020-05-24 ENCOUNTER — Other Ambulatory Visit: Payer: Self-pay

## 2020-05-24 ENCOUNTER — Ambulatory Visit (INDEPENDENT_AMBULATORY_CARE_PROVIDER_SITE_OTHER): Payer: Medicare Other | Admitting: Family Medicine

## 2020-05-24 ENCOUNTER — Encounter (INDEPENDENT_AMBULATORY_CARE_PROVIDER_SITE_OTHER): Payer: Self-pay | Admitting: Family Medicine

## 2020-05-24 VITALS — BP 133/72 | HR 74 | Temp 97.7°F | Ht 64.0 in | Wt 179.0 lb

## 2020-05-24 DIAGNOSIS — E669 Obesity, unspecified: Secondary | ICD-10-CM | POA: Diagnosis not present

## 2020-05-24 DIAGNOSIS — E7849 Other hyperlipidemia: Secondary | ICD-10-CM

## 2020-05-24 DIAGNOSIS — Z6830 Body mass index (BMI) 30.0-30.9, adult: Secondary | ICD-10-CM

## 2020-05-24 NOTE — Progress Notes (Signed)
Chief Complaint:   OBESITY Natalie Hall is here to discuss her progress with her obesity treatment plan along with follow-up of her obesity related diagnoses. Natalie Hall is on the Category 2 Plan and states she is following her eating plan approximately 80% of the time. Natalie Hall states she is doing 0 minutes 0 times per week.  Today's visit was #: 8 Starting weight: 213 lbs Starting date: 08/04/2018 Today's weight: 179 lbs Today's date: 05/24/2020 Total lbs lost to date: 34 Total lbs lost since last in-office visit: 0  Interim History: Natalie Hall feels she adjeres to the plan fairly well most of the time.  She has been plateaued around 179-180 lbs for about a year. Category 1 plan leavers her hungry but she is not losing weight on category 2. She does not like to exercise. She notes the Silver Sneakers classes are held too early.  Subjective:   1. Other hyperlipidemia Daveigh has hyperlipidemia and has been trying to improve her cholesterol levels with intensive lifestyle modification including a low saturated fat diet, exercise and weight loss. Natalie Hall is on Zetia and Crestor. Last HDL was low at 29. She denies any chest pain, claudication or myalgias.  Lab Results  Component Value Date   ALT 22 03/31/2020   AST 25 03/31/2020   ALKPHOS 42 03/31/2020   BILITOT 0.5 03/31/2020   Lab Results  Component Value Date   CHOL 115 03/31/2020   HDL 29.10 (L) 03/31/2020   LDLCALC 57 03/31/2020   LDLDIRECT 96.0 03/11/2018   TRIG 142.0 03/31/2020   CHOLHDL 4 03/31/2020   Assessment/Plan:   1. Other hyperlipidemia Cardiovascular risk and specific lipid/LDL goals reviewed. We discussed several lifestyle modifications today. Natalie Hall will continue Zetia and Crestor, and will continue to work on diet, weight loss efforts, and increase exercise.   2. Class 1 obesity with serious comorbidity and body mass index (BMI) of 30.0 to 30.9 in adult, unspecified obesity type Natalie Hall is currently in the action stage  of change. As such, her goal is to continue with weight loss efforts. She has agreed to the Category 2 Plan.   We discussed that exercise will be needed to stimulate more weight loss.  Exercise goals: Strongly encouraged exercise.  Behavioral modification strategies: increasing lean protein intake and decreasing simple carbohydrates.  Natalie Hall has agreed to follow-up with our clinic in 3 weeks. She was informed of the importance of frequent follow-up visits to maximize her success with intensive lifestyle modifications for her multiple health conditions.   Objective:   Blood pressure 133/72, pulse 74, temperature 97.7 F (36.5 C), temperature source Oral, height 5\' 4"  (1.626 m), weight 179 lb (81.2 kg), SpO2 96 %. Body mass index is 30.73 kg/m.  General: Cooperative, alert, well developed, in no acute distress. HEENT: Conjunctivae and lids unremarkable. Cardiovascular: Regular rhythm.  Lungs: Normal work of breathing. Neurologic: No focal deficits.   Lab Results  Component Value Date   CREATININE 0.72 03/31/2020   BUN 23 03/31/2020   NA 135 03/31/2020   K 4.3 03/31/2020   CL 100 03/31/2020   CO2 28 03/31/2020   Lab Results  Component Value Date   ALT 22 03/31/2020   AST 25 03/31/2020   ALKPHOS 42 03/31/2020   BILITOT 0.5 03/31/2020   Lab Results  Component Value Date   HGBA1C 6.0 03/31/2020   HGBA1C 5.6 11/17/2019   HGBA1C 6.0 03/31/2019   HGBA1C 5.7 (H) 11/20/2018   HGBA1C 5.9 (H) 08/04/2018   Lab Results  Component Value Date   INSULIN 18.5 11/17/2019   INSULIN 15.4 11/20/2018   INSULIN 33.5 (H) 08/04/2018   Lab Results  Component Value Date   TSH 1.57 03/31/2020   Lab Results  Component Value Date   CHOL 115 03/31/2020   HDL 29.10 (L) 03/31/2020   LDLCALC 57 03/31/2020   LDLDIRECT 96.0 03/11/2018   TRIG 142.0 03/31/2020   CHOLHDL 4 03/31/2020   Lab Results  Component Value Date   WBC 5.3 03/31/2020   HGB 13.0 03/31/2020   HCT 38.4 03/31/2020    MCV 86.4 03/31/2020   PLT 170.0 03/31/2020   No results found for: IRON, TIBC, FERRITIN  Obesity Behavioral Intervention Documentation for Insurance:   Approximately 15 minutes were spent on the discussion below.  ASK: We discussed the diagnosis of obesity with Natalie Hall today and Darcell agreed to give [name] permission to discuss obesity behavioral modification therapy today.  ASSESS: Natalie Hall has the diagnosis of obesity and her BMI today is 30.71. Natalie Hall is in the action stage of change.   ADVISE: Natalie Hall was educated on the multiple health risks of obesity as well as the benefit of weight loss to improve her health. She was advised of the need for long term treatment and the importance of lifestyle modifications to improve her current health and to decrease her risk of future health problems.  AGREE: Multiple dietary modification options and treatment options were discussed and Natalie Hall agreed to follow the recommendations documented in the above note.  ARRANGE: Natalie Hall was educated on the importance of frequent visits to treat obesity as outlined per CMS and USPSTF guidelines and agreed to schedule her next follow up appointment today.  Attestation Statements:   Reviewed by clinician on day of visit: allergies, medications, problem list, medical history, surgical history, family history, social history, and previous encounter notes.   [name], am acting as Location manager for [employer], FNP-C.  I have reviewed the above documentation for accuracy and completeness, and I agree with the above. -  Georgianne Fick, FNP

## 2020-05-25 DIAGNOSIS — M25561 Pain in right knee: Secondary | ICD-10-CM | POA: Insufficient documentation

## 2020-06-15 ENCOUNTER — Other Ambulatory Visit: Payer: Self-pay

## 2020-06-15 ENCOUNTER — Encounter (INDEPENDENT_AMBULATORY_CARE_PROVIDER_SITE_OTHER): Payer: Self-pay | Admitting: Family Medicine

## 2020-06-15 ENCOUNTER — Ambulatory Visit (INDEPENDENT_AMBULATORY_CARE_PROVIDER_SITE_OTHER): Payer: Medicare Other | Admitting: Family Medicine

## 2020-06-15 VITALS — BP 119/67 | HR 57 | Temp 97.9°F | Ht 64.0 in | Wt 178.0 lb

## 2020-06-15 DIAGNOSIS — I10 Essential (primary) hypertension: Secondary | ICD-10-CM | POA: Diagnosis not present

## 2020-06-15 DIAGNOSIS — Z6830 Body mass index (BMI) 30.0-30.9, adult: Secondary | ICD-10-CM

## 2020-06-15 DIAGNOSIS — E669 Obesity, unspecified: Secondary | ICD-10-CM | POA: Diagnosis not present

## 2020-06-15 DIAGNOSIS — R7303 Prediabetes: Secondary | ICD-10-CM | POA: Diagnosis not present

## 2020-06-15 MED ORDER — METFORMIN HCL 500 MG PO TABS: 500.0000 mg | ORAL_TABLET | Freq: Every day | ORAL | 0 refills | Status: DC

## 2020-06-15 NOTE — Progress Notes (Signed)
Chief Complaint:   OBESITY Natalie Hall is here to discuss her progress with her obesity treatment plan along with follow-up of her obesity related diagnoses. [name] is on the Category 2 Plan and states she is following her eating plan approximately 80-85% of the time. [name] states she is in a exercise program for 60 minutes 3 times per week.  Today's visit was #: 48 Starting weight: 213 lbs Starting date: 08/04/2018 Today's weight: 178 lbs Today's date: 06/15/2020 Total lbs lost to date: 35 Total lbs lost since last in-office visit: 1  Interim History: [name] started an exercise program 3 times per week. We had discussed adding exercise to facilitate weight loss. She is adhering to the category 2 plan well. She is going to a fitness program at a church that participates in [employer]. There is also a pool there and she plans on swimming for exercise.   She has been plateaued around 179-180 lbs for about a year. Category 1 plan leaves her hungry but she is not losing weight on category 2.   Subjective:   1. Pre-diabetes [name] has a diagnosis of pre-diabetes based on her elevated Hgb A1c and was informed this puts her at greater risk of developing diabetes. Her hunger is satisfied. She is on metformin QAM. Last A1c was 6.0. She continues to work on diet and exercise to decrease her risk of diabetes. She denies nausea or hypoglycemia.  Lab Results  Component Value Date   HGBA1C 6.0 03/31/2020   Lab Results  Component Value Date   INSULIN 18.5 11/17/2019   INSULIN 15.4 11/20/2018   INSULIN 33.5 (H) 08/04/2018   2. Essential hypertension Jinny's blood pressure is well controlled. She is on metoprolol and ramipril. Cardiovascular ROS: no chest pain or dyspnea on exertion.  BP Readings from Last 3 Encounters:  06/15/20 119/67  05/24/20 133/72  05/03/20 117/70   Lab Results  Component Value Date   CREATININE 0.72 03/31/2020   CREATININE 0.60 11/17/2019   CREATININE 0.61  03/31/2019   Assessment/Plan:   1. Pre-diabetes [name] will continue to work on weight loss, exercise, and decreasing simple carbohydrates to help decrease the risk of diabetes. We will refill metformin for 1 month.  - metFORMIN (GLUCOPHAGE) 500 MG tablet; Take 1 tablet (500 mg total) by mouth daily with breakfast.  Dispense: 30 tablet; Refill: 0  2. Essential hypertension [name] will continue all her medications, and will continue working on healthy weight loss and exercise to improve blood pressure control. We will watch for signs of hypotension as she continues her lifestyle modifications.  3. Class 1 obesity with serious comorbidity and body mass index (BMI) of 30.0 to 30.9 in adult, unspecified obesity type [name] is currently in the action stage of change. As such, her goal is to continue with weight loss efforts. She has agreed to the Category 2 Plan.   Exercise goals: As is.  Behavioral modification strategies: planning for success.  [name] has agreed to follow-up with our clinic in 3 weeks. She was informed of the importance of frequent follow-up visits to maximize her success with intensive lifestyle modifications for her multiple health conditions.   Objective:   Blood pressure 119/67, pulse (!) 57, temperature 97.9 F (36.6 C), temperature source Oral, height 5\' 4"  (1.626 m), weight 178 lb (80.7 kg), SpO2 100 %. Body mass index is 30.55 kg/m.  General: Cooperative, alert, well developed, in no acute distress. HEENT: Conjunctivae and lids unremarkable. Cardiovascular: Regular rhythm.  Lungs:  Normal work of breathing. Neurologic: No focal deficits.   Lab Results  Component Value Date   CREATININE 0.72 03/31/2020   BUN 23 03/31/2020   NA 135 03/31/2020   K 4.3 03/31/2020   CL 100 03/31/2020   CO2 28 03/31/2020   Lab Results  Component Value Date   ALT 22 03/31/2020   AST 25 03/31/2020   ALKPHOS 42 03/31/2020   BILITOT 0.5 03/31/2020   Lab Results  Component  Value Date   HGBA1C 6.0 03/31/2020   HGBA1C 5.6 11/17/2019   HGBA1C 6.0 03/31/2019   HGBA1C 5.7 (H) 11/20/2018   HGBA1C 5.9 (H) 08/04/2018   Lab Results  Component Value Date   INSULIN 18.5 11/17/2019   INSULIN 15.4 11/20/2018   INSULIN 33.5 (H) 08/04/2018   Lab Results  Component Value Date   TSH 1.57 03/31/2020   Lab Results  Component Value Date   CHOL 115 03/31/2020   HDL 29.10 (L) 03/31/2020   LDLCALC 57 03/31/2020   LDLDIRECT 96.0 03/11/2018   TRIG 142.0 03/31/2020   CHOLHDL 4 03/31/2020   Lab Results  Component Value Date   WBC 5.3 03/31/2020   HGB 13.0 03/31/2020   HCT 38.4 03/31/2020   MCV 86.4 03/31/2020   PLT 170.0 03/31/2020   No results found for: IRON, TIBC, FERRITIN  Obesity Behavioral Intervention Documentation for Insurance:   Approximately 15 minutes were spent on the discussion below.  ASK: We discussed the diagnosis of obesity with Natalie Hall today and [name] agreed to give [name] permission to discuss obesity behavioral modification therapy today.  ASSESS: Natalie Hall has the diagnosis of obesity and her BMI today is 30.54. [name] is in the action stage of change.   ADVISE: [name] was educated on the multiple health risks of obesity as well as the benefit of weight loss to improve her health. She was advised of the need for long term treatment and the importance of lifestyle modifications to improve her current health and to decrease her risk of future health problems.  AGREE: Multiple dietary modification options and treatment options were discussed and [name] agreed to follow the recommendations documented in the above note.  ARRANGE: Natalie Hall was educated on the importance of frequent visits to treat obesity as outlined per CMS and USPSTF guidelines and agreed to schedule her next follow up appointment today.  Attestation Statements:   Reviewed by clinician on day of visit: allergies, medications, problem list, medical history, surgical history,  family history, social history, and previous encounter notes.   Natalie Hall, am acting as Location manager for [employer], FNP-C.  I have reviewed the above documentation for accuracy and completeness, and I agree with the above. -  Natalie Fick, FNP

## 2020-06-24 ENCOUNTER — Encounter: Payer: Self-pay | Admitting: Family Medicine

## 2020-06-24 LAB — HM DIABETES EYE EXAM

## 2020-07-06 ENCOUNTER — Ambulatory Visit (INDEPENDENT_AMBULATORY_CARE_PROVIDER_SITE_OTHER): Payer: Medicare Other | Admitting: Family Medicine

## 2020-07-06 ENCOUNTER — Encounter (INDEPENDENT_AMBULATORY_CARE_PROVIDER_SITE_OTHER): Payer: Self-pay | Admitting: Family Medicine

## 2020-07-06 ENCOUNTER — Other Ambulatory Visit: Payer: Self-pay

## 2020-07-06 VITALS — BP 116/60 | HR 64 | Temp 97.9°F | Ht 64.0 in | Wt 179.0 lb

## 2020-07-06 DIAGNOSIS — I10 Essential (primary) hypertension: Secondary | ICD-10-CM | POA: Diagnosis not present

## 2020-07-06 DIAGNOSIS — R7303 Prediabetes: Secondary | ICD-10-CM

## 2020-07-06 DIAGNOSIS — E669 Obesity, unspecified: Secondary | ICD-10-CM | POA: Diagnosis not present

## 2020-07-06 DIAGNOSIS — Z6830 Body mass index (BMI) 30.0-30.9, adult: Secondary | ICD-10-CM | POA: Diagnosis not present

## 2020-07-06 MED ORDER — METFORMIN HCL 500 MG PO TABS: 500.0000 mg | ORAL_TABLET | Freq: Every day | ORAL | 0 refills | Status: DC

## 2020-07-07 NOTE — Progress Notes (Signed)
Chief Complaint:   OBESITY Natalie Hall is here to discuss her progress with her obesity treatment plan along with follow-up of her obesity related diagnoses. Natalie Hall is on the Category 2 Plan and states she is following her eating plan approximately 70% of the time. Natalie Hall states she is doing [employer] and swimming for 60 minutes 3-4 times per week.  Today's visit was #: 6 Starting weight: 213 lbs Starting date: 08/04/2018 Today's weight: 179 lbs Today's date: 07/06/2020 Total lbs lost to date: 34 Total lbs lost since last in-office visit: 0  Interim History: Natalie Hall notes some polyphagia. She has been plateaued with weight for a year. She has started swimming for exercise which will hopefully build muscle mass.  Subjective:   1. Pre-diabetes Natalie Hall is on metformin. Last A1c was 6.0. We discussed Ozempic and she will consider this.  Lab Results  Component Value Date   HGBA1C 6.0 03/31/2020   Lab Results  Component Value Date   INSULIN 18.5 11/17/2019   INSULIN 15.4 11/20/2018   INSULIN 33.5 (H) 08/04/2018   2. Essential hypertension Natalie Hall's blood pressure is well controlled on metoprolol and lisinopril. Cardiovascular ROS: no chest pain or dyspnea on exertion.  BP Readings from Last 3 Encounters:  07/06/20 116/60  06/15/20 119/67  05/24/20 133/72   Lab Results  Component Value Date   CREATININE 0.72 03/31/2020   CREATININE 0.60 11/17/2019   CREATININE 0.61 03/31/2019   Assessment/Plan:   1. Pre-diabetes We will refill metformin for 1 month.  - metFORMIN (GLUCOPHAGE) 500 MG tablet; Take 1 tablet (500 mg total) by mouth daily with breakfast.  Dispense: 30 tablet; Refill: 0  2. Essential hypertension Natalie Hall will continue her anti-hypertensives.  3. Class 1 obesity with serious comorbidity and body mass index (BMI) of 30.0 to 30.9 in adult, unspecified obesity type Natalie Hall is currently in the action stage of change. As such, her goal is to continue with  weight loss efforts. She has agreed to the Category 2 Plan.   Exercise goals: As is.  Behavioral modification strategies: increasing lean protein intake and decreasing simple carbohydrates.  Natalie Hall has agreed to follow-up with our clinic in 3 weeks.  Objective:   Blood pressure 116/60, pulse 64, temperature 97.9 F (36.6 C), temperature source Oral, height 5\' 4"  (1.626 m), weight 179 lb (81.2 kg), SpO2 99 %. Body mass index is 30.73 kg/m.  General: Cooperative, alert, well developed, in no acute distress. HEENT: Conjunctivae and lids unremarkable. Cardiovascular: Regular rhythm.  Lungs: Normal work of breathing. Neurologic: No focal deficits.   Lab Results  Component Value Date   CREATININE 0.72 03/31/2020   BUN 23 03/31/2020   NA 135 03/31/2020   K 4.3 03/31/2020   CL 100 03/31/2020   CO2 28 03/31/2020   Lab Results  Component Value Date   ALT 22 03/31/2020   AST 25 03/31/2020   ALKPHOS 42 03/31/2020   BILITOT 0.5 03/31/2020   Lab Results  Component Value Date   HGBA1C 6.0 03/31/2020   HGBA1C 5.6 11/17/2019   HGBA1C 6.0 03/31/2019   HGBA1C 5.7 (H) 11/20/2018   HGBA1C 5.9 (H) 08/04/2018   Lab Results  Component Value Date   INSULIN 18.5 11/17/2019   INSULIN 15.4 11/20/2018   INSULIN 33.5 (H) 08/04/2018   Lab Results  Component Value Date   TSH 1.57 03/31/2020   Lab Results  Component Value Date   CHOL 115 03/31/2020   HDL 29.10 (L) 03/31/2020   LDLCALC 57  03/31/2020   LDLDIRECT 96.0 03/11/2018   TRIG 142.0 03/31/2020   CHOLHDL 4 03/31/2020   Lab Results  Component Value Date   WBC 5.3 03/31/2020   HGB 13.0 03/31/2020   HCT 38.4 03/31/2020   MCV 86.4 03/31/2020   PLT 170.0 03/31/2020   No results found for: IRON, TIBC, FERRITIN  Obesity Behavioral Intervention:   Approximately 15 minutes were spent on the discussion below.  ASK: We discussed the diagnosis of obesity with Natalie Hall today and Natalie Hall agreed to give [name] permission to discuss  obesity behavioral modification therapy today.  ASSESS: Natalie Hall has the diagnosis of obesity and her BMI today is 30.71. Natalie Hall is in the action stage of change.   ADVISE: Natalie Hall was educated on the multiple health risks of obesity as well as the benefit of weight loss to improve her health. She was advised of the need for long term treatment and the importance of lifestyle modifications to improve her current health and to decrease her risk of future health problems.  AGREE: Multiple dietary modification options and treatment options were discussed and Natalie Hall agreed to follow the recommendations documented in the above note.  ARRANGE: Natalie Hall was educated on the importance of frequent visits to treat obesity as outlined per CMS and USPSTF guidelines and agreed to schedule her next follow up appointment today.  Attestation Statements:   Reviewed by clinician on day of visit: allergies, medications, problem list, medical history, surgical history, family history, social history, and previous encounter notes.   [name], am acting as Location manager for [employer], FNP-C.  I have reviewed the above documentation for accuracy and completeness, and I agree with the above. -  Georgianne Fick, FNP

## 2020-07-11 ENCOUNTER — Encounter (INDEPENDENT_AMBULATORY_CARE_PROVIDER_SITE_OTHER): Payer: Self-pay | Admitting: Family Medicine

## 2020-07-28 ENCOUNTER — Encounter (INDEPENDENT_AMBULATORY_CARE_PROVIDER_SITE_OTHER): Payer: Self-pay | Admitting: Family Medicine

## 2020-07-28 ENCOUNTER — Other Ambulatory Visit: Payer: Self-pay

## 2020-07-28 ENCOUNTER — Ambulatory Visit (INDEPENDENT_AMBULATORY_CARE_PROVIDER_SITE_OTHER): Payer: Medicare Other | Admitting: Family Medicine

## 2020-07-28 VITALS — BP 116/73 | HR 62 | Temp 98.1°F | Ht 64.0 in | Wt 179.0 lb

## 2020-07-28 DIAGNOSIS — E669 Obesity, unspecified: Secondary | ICD-10-CM

## 2020-07-28 DIAGNOSIS — R7303 Prediabetes: Secondary | ICD-10-CM | POA: Diagnosis not present

## 2020-07-28 DIAGNOSIS — I10 Essential (primary) hypertension: Secondary | ICD-10-CM

## 2020-07-28 DIAGNOSIS — Z6830 Body mass index (BMI) 30.0-30.9, adult: Secondary | ICD-10-CM | POA: Diagnosis not present

## 2020-07-28 MED ORDER — METFORMIN HCL 500 MG PO TABS: 500.0000 mg | ORAL_TABLET | Freq: Every day | ORAL | 0 refills | Status: DC

## 2020-07-28 NOTE — Progress Notes (Signed)
Chief Complaint:   OBESITY Natalie Hall is here to discuss her progress with her obesity treatment plan along with follow-up of her obesity related diagnoses. Natalie Hall is on the Category 2 Plan and states she is following her eating plan approximately 80-85% of the time. Natalie Hall states she is doing silver sneakers and walking for 45 minutes 3+ times per week.  Today's visit was #: 54 Starting weight: 213 lbs Starting date: 08/04/2018 Today's weight: 179 lbs Today's date: 07/28/2020 Total lbs lost to date: 34 Total lbs lost since last in-office visit: 0  Interim History: Natalie Hall is still doing a great job with exercise. She tends to deviate on the plan some at supper, and she does have dessert 2 times per week (300 calorie dessert). She is not weighing her protein much and I feel she has a protein deficit. She would like to lose down to 165 lbs. (28 BMI)  Subjective:   1. Pre-diabetes  She denies polyphagia. Last A1c was 6.0. Natalie Hall is on metformin 500 mg daily.  Lab Results  Component Value Date   HGBA1C 6.0 03/31/2020   Lab Results  Component Value Date   INSULIN 18.5 11/17/2019   INSULIN 15.4 11/20/2018   INSULIN 33.5 (H) 08/04/2018   2. Essential hypertension Natalie Hall's hypertension is well controlled on metoprolol and Ramipril.   BP Readings from Last 3 Encounters:  07/28/20 116/73  07/06/20 116/60  06/15/20 119/67   Lab Results  Component Value Date   CREATININE 0.72 03/31/2020   CREATININE 0.60 11/17/2019   CREATININE 0.61 03/31/2019   Assessment/Plan:   1. Pre-diabetes Refill metformin for 90 days, with no refill.  - metFORMIN (GLUCOPHAGE) 500 MG tablet; Take 1 tablet (500 mg total) by mouth daily with breakfast.  Dispense: 90 tablet; Refill: 0  2. Essential hypertension Floretta will continue metoprolol and Ramipril.  3. Class 1 obesity with serious comorbidity and body mass index (BMI) of 30.0 to 30.9 in adult, unspecified obesity type Natalie Hall is currently in  the action stage of change. As such, her goal is to continue with weight loss efforts. She has agreed to the Category 2 Plan.   Natalie Hall will weigh her meat to assure she is getting the prescribed protein. I advised her that physical activity, especially resistance training will eventually help her lose more weight.  Exercise goals: As is.  Behavioral modification strategies: increasing lean protein intake and decreasing simple carbohydrates.  Natalie Hall has agreed to follow-up with our clinic in 2 weeks.   Objective:   Blood pressure 116/73, pulse 62, temperature 98.1 F (36.7 C), temperature source Oral, height 5\' 4"  (1.626 m), weight 179 lb (81.2 kg), SpO2 98 %. Body mass index is 30.73 kg/m.  General: Cooperative, alert, well developed, in no acute distress. HEENT: Conjunctivae and lids unremarkable. Cardiovascular: Regular rhythm.  Lungs: Normal work of breathing. Neurologic: No focal deficits.   Lab Results  Component Value Date   CREATININE 0.72 03/31/2020   BUN 23 03/31/2020   NA 135 03/31/2020   K 4.3 03/31/2020   CL 100 03/31/2020   CO2 28 03/31/2020   Lab Results  Component Value Date   ALT 22 03/31/2020   AST 25 03/31/2020   ALKPHOS 42 03/31/2020   BILITOT 0.5 03/31/2020   Lab Results  Component Value Date   HGBA1C 6.0 03/31/2020   HGBA1C 5.6 11/17/2019   HGBA1C 6.0 03/31/2019   HGBA1C 5.7 (H) 11/20/2018   HGBA1C 5.9 (H) 08/04/2018   Lab Results  Component Value Date   INSULIN 18.5 11/17/2019   INSULIN 15.4 11/20/2018   INSULIN 33.5 (H) 08/04/2018   Lab Results  Component Value Date   TSH 1.57 03/31/2020   Lab Results  Component Value Date   CHOL 115 03/31/2020   HDL 29.10 (L) 03/31/2020   LDLCALC 57 03/31/2020   LDLDIRECT 96.0 03/11/2018   TRIG 142.0 03/31/2020   CHOLHDL 4 03/31/2020   Lab Results  Component Value Date   WBC 5.3 03/31/2020   HGB 13.0 03/31/2020   HCT 38.4 03/31/2020   MCV 86.4 03/31/2020   PLT 170.0 03/31/2020   No  results found for: IRON, TIBC, FERRITIN  Obesity Behavioral Intervention:   Approximately 15 minutes were spent on the discussion below.  ASK: We discussed the diagnosis of obesity with Natalie Hall today and Margaretmary agreed to give Natalie Hall permission to discuss obesity behavioral modification therapy today.  ASSESS: Natalie Hall has the diagnosis of obesity and her BMI today is 30.71. Natalie Hall is in the action stage of change.   ADVISE: Natalie Hall was educated on the multiple health risks of obesity as well as the benefit of weight loss to improve her health. She was advised of the need for long term treatment and the importance of lifestyle modifications to improve her current health and to decrease her risk of future health problems.  AGREE: Multiple dietary modification options and treatment options were discussed and Natalie Hall agreed to follow the recommendations documented in the above note.  ARRANGE: Natalie Hall was educated on the importance of frequent visits to treat obesity as outlined per CMS and USPSTF guidelines and agreed to schedule her next follow up appointment today.  Attestation Statements:   Reviewed by clinician on day of visit: allergies, medications, problem list, medical history, surgical history, family history, social history, and previous encounter notes.   Natalie Hall, am acting as Location manager for Natalie Schwab, FNP-C.  I have reviewed the above documentation for accuracy and completeness, and I agree with the above. -  Georgianne Fick, FNP

## 2020-08-11 ENCOUNTER — Other Ambulatory Visit: Payer: Self-pay

## 2020-08-11 ENCOUNTER — Ambulatory Visit (INDEPENDENT_AMBULATORY_CARE_PROVIDER_SITE_OTHER): Payer: Medicare Other | Admitting: Family Medicine

## 2020-08-11 ENCOUNTER — Encounter (INDEPENDENT_AMBULATORY_CARE_PROVIDER_SITE_OTHER): Payer: Self-pay | Admitting: Family Medicine

## 2020-08-11 VITALS — BP 118/66 | HR 70 | Temp 98.9°F | Ht 64.0 in | Wt 180.0 lb

## 2020-08-11 DIAGNOSIS — E669 Obesity, unspecified: Secondary | ICD-10-CM | POA: Diagnosis not present

## 2020-08-11 DIAGNOSIS — R7303 Prediabetes: Secondary | ICD-10-CM | POA: Diagnosis not present

## 2020-08-11 DIAGNOSIS — Z6830 Body mass index (BMI) 30.0-30.9, adult: Secondary | ICD-10-CM | POA: Diagnosis not present

## 2020-08-15 NOTE — Progress Notes (Signed)
Chief Complaint:   OBESITY Natalie Hall is here to discuss her progress with her obesity treatment plan along with follow-up of her obesity related diagnoses. Natalie Hall is on the Category 2 Plan and states she is following her eating plan approximately 80% of the time. Natalie Hall states she is doing [employer] and walking 60 minutes 3 times per week.  Today's visit was #: 25 Starting weight: 213 lbs Starting date: 10//14/2019 Today's weight: 180 lbs Today's date: 08/11/2020 Total lbs lost to date: 33 Total lbs lost since last in-office visit: 0  Interim History: Natalie Hall notes her weight fluctuates day to day. We discussed Ozempic and she is considering this for appetite. She has talked to some friends who take and feel it is beneficial for appetite. She feels hungry when she follows the Category 1 meal plan. So we have her on Cat 2.  She has been plateaued for a year, but has recently started exercising. I am hopeful that this will help spur more weight loss.  Subjective:   Pre-diabetes.  She denies nausea or hypoglycemia. Natalie Hall is on metformin daily and denies excessive hunger.  Last A1c 6.0.  Lab Results  Component Value Date   HGBA1C 6.0 03/31/2020   Lab Results  Component Value Date   INSULIN 18.5 11/17/2019   INSULIN 15.4 11/20/2018   INSULIN 33.5 (H) 08/04/2018   Assessment/Plan:   Pre-diabetes.  She will continue metformin as directed and will consider Ozempic.  Class 1 obesity with serious comorbidity and body mass index (BMI) of 30.0 to 30.9 in adult, unspecified obesity type.  Natalie Hall is currently in the action stage of change. As such, her goal is to continue with weight loss efforts. She has agreed to the Category 2 Plan.   Natalie Hall will be performed at the time of her next office visit.  Exercise goals: Natalie Hall will continue her current exercise regimen.   Behavioral modification strategies: increasing lean protein intake, decreasing simple carbohydrates and  planning for success.  Natalie Hall has agreed to follow-up with our clinic in 3 weeks.  Objective:   Blood pressure 118/66, pulse 70, temperature 98.9 F (37.2 C), height 5\' 4"  (1.626 m), weight 180 lb (81.6 kg), SpO2 98 %. Body mass index is 30.9 kg/m.  General: Cooperative, alert, well developed, in no acute distress. HEENT: Conjunctivae and lids unremarkable. Cardiovascular: Regular rhythm.  Lungs: Normal work of breathing. Neurologic: No focal deficits.   Lab Results  Component Value Date   CREATININE 0.72 03/31/2020   BUN 23 03/31/2020   NA 135 03/31/2020   K 4.3 03/31/2020   CL 100 03/31/2020   CO2 28 03/31/2020   Lab Results  Component Value Date   ALT 22 03/31/2020   AST 25 03/31/2020   ALKPHOS 42 03/31/2020   BILITOT 0.5 03/31/2020   Lab Results  Component Value Date   HGBA1C 6.0 03/31/2020   HGBA1C 5.6 11/17/2019   HGBA1C 6.0 03/31/2019   HGBA1C 5.7 (H) 11/20/2018   HGBA1C 5.9 (H) 08/04/2018   Lab Results  Component Value Date   INSULIN 18.5 11/17/2019   INSULIN 15.4 11/20/2018   INSULIN 33.5 (H) 08/04/2018   Lab Results  Component Value Date   TSH 1.57 03/31/2020   Lab Results  Component Value Date   CHOL 115 03/31/2020   HDL 29.10 (L) 03/31/2020   LDLCALC 57 03/31/2020   LDLDIRECT 96.0 03/11/2018   TRIG 142.0 03/31/2020   CHOLHDL 4 03/31/2020   Lab Results  Component Value Date   WBC 5.3 03/31/2020   HGB 13.0 03/31/2020   HCT 38.4 03/31/2020   MCV 86.4 03/31/2020   PLT 170.0 03/31/2020   No results found for: IRON, TIBC, FERRITIN  Obesity Behavioral Intervention:   Approximately 15 minutes were spent on the discussion below.  ASK: We discussed the diagnosis of obesity with Natalie Hall today and Natalie Hall agreed to give [name] permission to discuss obesity behavioral modification therapy today.  ASSESS: Natalie Hall has the diagnosis of obesity and her BMI today is 30.9. Natalie Hall is in the action stage of change.   ADVISE: Natalie Hall was educated on the  multiple health risks of obesity as well as the benefit of weight loss to improve her health. She was advised of the need for long term treatment and the importance of lifestyle modifications to improve her current health and to decrease her risk of future health problems.  AGREE: Multiple dietary modification options and treatment options were discussed and Jasmen agreed to follow the recommendations documented in the above note.  ARRANGE: Tyeasha was educated on the importance of frequent visits to treat obesity as outlined per CMS and USPSTF guidelines and agreed to schedule her next follow up appointment today.  Attestation Statements:   Reviewed by clinician on day of visit: allergies, medications, problem list, medical history, surgical history, family history, social history, and previous encounter notes.  IMichaelene Hall, am acting as Location manager for [employer], FNP-C   I have reviewed the above documentation for accuracy and completeness, and I agree with the above. - Georgianne Fick, FNP

## 2020-08-22 ENCOUNTER — Other Ambulatory Visit: Payer: Self-pay | Admitting: Family Medicine

## 2020-08-22 NOTE — Telephone Encounter (Signed)
Last prescribed on 03/01/2020 #90 with 1 refill. Last OV on 03/31/2020. No future appointment scheduled.

## 2020-08-23 ENCOUNTER — Telehealth: Payer: Self-pay | Admitting: Internal Medicine

## 2020-08-23 NOTE — Telephone Encounter (Signed)
New Message:    Pt wants to know if she needs lab work before her office visit?

## 2020-08-23 NOTE — Telephone Encounter (Signed)
Called patient and left a voicemail that will review need of lab during office visit.  Instructed to call back if further questions.

## 2020-09-05 ENCOUNTER — Ambulatory Visit (INDEPENDENT_AMBULATORY_CARE_PROVIDER_SITE_OTHER): Payer: Medicare Other | Admitting: Family Medicine

## 2020-09-05 ENCOUNTER — Encounter (INDEPENDENT_AMBULATORY_CARE_PROVIDER_SITE_OTHER): Payer: Self-pay | Admitting: Family Medicine

## 2020-09-05 ENCOUNTER — Other Ambulatory Visit: Payer: Self-pay

## 2020-09-05 VITALS — BP 117/70 | HR 67 | Temp 98.1°F | Ht 64.0 in | Wt 179.0 lb

## 2020-09-05 DIAGNOSIS — J45909 Unspecified asthma, uncomplicated: Secondary | ICD-10-CM

## 2020-09-05 DIAGNOSIS — E669 Obesity, unspecified: Secondary | ICD-10-CM | POA: Diagnosis not present

## 2020-09-05 DIAGNOSIS — Z6830 Body mass index (BMI) 30.0-30.9, adult: Secondary | ICD-10-CM

## 2020-09-05 DIAGNOSIS — R7303 Prediabetes: Secondary | ICD-10-CM | POA: Diagnosis not present

## 2020-09-07 ENCOUNTER — Encounter (INDEPENDENT_AMBULATORY_CARE_PROVIDER_SITE_OTHER): Payer: Self-pay | Admitting: Family Medicine

## 2020-09-07 NOTE — Progress Notes (Signed)
Chief Complaint:   OBESITY Talea is here to discuss her progress with her obesity treatment plan along with follow-up of her obesity related diagnoses. Melvie is on the Category 2 Plan and states she is following her eating plan approximately 90% of the time. Damari states she is doing silver sneakers for 60 minutes 3 times per week.  Today's visit was #: 64 Starting weight: 213 lbs Starting date: 08/04/2018 Today's weight: 179 lbs Today's date: 09/05/2020 Total lbs lost to date: 34 Total lbs lost since last in-office visit: 1  Interim History: Laken's RMR is down to 1480 from 1686 (-206 kcal) which could account for her long weight plateau.  Her hunger is satisfied on the Category 2 plan. We discussed that the Category 1 plan would likely cause more weight loss. She has been exercising recently which hopefully will help build muscle and increase metabolism.   Subjective:   1. Pre-diabetes Shadai is on metformin 500 mg q AM, and she denies polyphagia. She is still considering starting Ozempic as I have suggested.   Lab Results  Component Value Date   HGBA1C 6.0 03/31/2020   Lab Results  Component Value Date   INSULIN 18.5 11/17/2019   INSULIN 15.4 11/20/2018   INSULIN 33.5 (H) 08/04/2018   2. Asthma Romi has history asthma which is well controlled. She does take Nasacort and Zyrtec as needed but does not use an inhaler.    Assessment/Plan:   1. Pre-diabetes Jesyca will continue metformin, and will continue to work on reducing simple carbs.   2. Asthma Continue Nasacort and Zyrtec PRN. IC was repeated today.  3. Class 1 obesity with serious comorbidity and body mass index (BMI) of 30.0 to 30.9 in adult, unspecified obesity type Stevi is currently in the action stage of change. As such, her goal is to continue with weight loss efforts. She has agreed to the Category 2 Plan.   Handout was given today: Thanksgiving Tips.  Exercise goals: As  is.  Behavioral modification strategies: increasing lean protein intake, decreasing simple carbohydrates and holiday eating strategies .  Muskaan has agreed to follow-up with our clinic in 3 weeks.   Objective:   Blood pressure 117/70, pulse 67, temperature 98.1 F (36.7 C), height 5\' 4"  (1.626 m), weight 179 lb (81.2 kg), SpO2 99 %. Body mass index is 30.73 kg/m.  General: Cooperative, alert, well developed, in no acute distress. HEENT: Conjunctivae and lids unremarkable. Cardiovascular: Regular rhythm.  Lungs: Normal work of breathing. Neurologic: No focal deficits.   Lab Results  Component Value Date   CREATININE 0.72 03/31/2020   BUN 23 03/31/2020   NA 135 03/31/2020   K 4.3 03/31/2020   CL 100 03/31/2020   CO2 28 03/31/2020   Lab Results  Component Value Date   ALT 22 03/31/2020   AST 25 03/31/2020   ALKPHOS 42 03/31/2020   BILITOT 0.5 03/31/2020   Lab Results  Component Value Date   HGBA1C 6.0 03/31/2020   HGBA1C 5.6 11/17/2019   HGBA1C 6.0 03/31/2019   HGBA1C 5.7 (H) 11/20/2018   HGBA1C 5.9 (H) 08/04/2018   Lab Results  Component Value Date   INSULIN 18.5 11/17/2019   INSULIN 15.4 11/20/2018   INSULIN 33.5 (H) 08/04/2018   Lab Results  Component Value Date   TSH 1.57 03/31/2020   Lab Results  Component Value Date   CHOL 115 03/31/2020   HDL 29.10 (L) 03/31/2020   LDLCALC 57 03/31/2020   LDLDIRECT 96.0  03/11/2018   TRIG 142.0 03/31/2020   CHOLHDL 4 03/31/2020   Lab Results  Component Value Date   WBC 5.3 03/31/2020   HGB 13.0 03/31/2020   HCT 38.4 03/31/2020   MCV 86.4 03/31/2020   PLT 170.0 03/31/2020   No results found for: IRON, TIBC, FERRITIN  Obesity Behavioral Intervention:   Approximately 15 minutes were spent on the discussion below.  ASK: We discussed the diagnosis of obesity with [name] today and Dakari agreed to give [name] permission to discuss obesity behavioral modification therapy today.  ASSESS: Londan has the diagnosis  of obesity and her BMI today is 30.71. Sairah is in the action stage of change.   ADVISE: Tulip was educated on the multiple health risks of obesity as well as the benefit of weight loss to improve her health. She was advised of the need for long term treatment and the importance of lifestyle modifications to improve her current health and to decrease her risk of future health problems.  AGREE: Multiple dietary modification options and treatment options were discussed and Camdynn agreed to follow the recommendations documented in the above note.  ARRANGE: Lauralynn was educated on the importance of frequent visits to treat obesity as outlined per CMS and USPSTF guidelines and agreed to schedule her next follow up appointment today.  Attestation Statements:   Reviewed by clinician on day of visit: allergies, medications, problem list, medical history, surgical history, family history, social history, and previous encounter notes.   [name], am acting as Location manager for [employer], FNP-C.  I have reviewed the above documentation for accuracy and completeness, and I agree with the above. -  Georgianne Fick, FNP

## 2020-09-26 ENCOUNTER — Encounter (INDEPENDENT_AMBULATORY_CARE_PROVIDER_SITE_OTHER): Payer: Self-pay | Admitting: Family Medicine

## 2020-09-26 ENCOUNTER — Other Ambulatory Visit: Payer: Self-pay

## 2020-09-26 ENCOUNTER — Ambulatory Visit (INDEPENDENT_AMBULATORY_CARE_PROVIDER_SITE_OTHER): Payer: Medicare Other | Admitting: Family Medicine

## 2020-09-26 VITALS — BP 121/74 | HR 61 | Temp 98.6°F | Ht 64.0 in | Wt 179.0 lb

## 2020-09-26 DIAGNOSIS — I10 Essential (primary) hypertension: Secondary | ICD-10-CM | POA: Diagnosis not present

## 2020-09-26 DIAGNOSIS — R7303 Prediabetes: Secondary | ICD-10-CM | POA: Diagnosis not present

## 2020-09-26 DIAGNOSIS — E669 Obesity, unspecified: Secondary | ICD-10-CM

## 2020-09-26 DIAGNOSIS — Z6830 Body mass index (BMI) 30.0-30.9, adult: Secondary | ICD-10-CM

## 2020-09-26 MED ORDER — OZEMPIC (0.25 OR 0.5 MG/DOSE) 2 MG/1.5ML ~~LOC~~ SOPN: 0.2500 mg | PEN_INJECTOR | SUBCUTANEOUS | 0 refills | Status: DC

## 2020-09-27 NOTE — Telephone Encounter (Signed)
Last seen by Dawn  

## 2020-09-28 ENCOUNTER — Encounter (INDEPENDENT_AMBULATORY_CARE_PROVIDER_SITE_OTHER): Payer: Self-pay | Admitting: Family Medicine

## 2020-09-28 NOTE — Progress Notes (Signed)
Chief Complaint:   OBESITY Natalie Hall is here to discuss her progress with her obesity treatment plan along with follow-up of her obesity related diagnoses. Natalie Hall is on the Category 2 Plan and states she is following her eating plan approximately 80% of the time. Natalie Hall states she is doing Chief of Staff for 60 minutes 3 times per week.  Today's visit was #: 18 Starting weight: 213 lbs Starting date: 08/04/2018 Today's weight: 179 lbs Today's date: 09/26/2020 Total lbs lost to date: 34 Total lbs lost since last in-office visit: 0  Interim History: Natalie Hall notes that she and her husband do eat out at times and she feels this is where she deviates from the plan. She has more social functions than usual now due to the holidays which has also led to off plan eating. When she is not dining out she is mostly on the plan. She has made a change over the past few months of adding exercise which is a victory for her.   Subjective:   1. Pre-diabetes Natalie Hall wants to try Ozempic which we have discussed previously. She notes polyphagia.   Lab Results  Component Value Date   HGBA1C 6.0 03/31/2020   Lab Results  Component Value Date   INSULIN 18.5 11/17/2019   INSULIN 15.4 11/20/2018   INSULIN 33.5 (H) 08/04/2018   2. Essential hypertension Natalie Hall's blood pressure is well controlled on metoprolol and ramipril. She sees cardiology for CAD.   BP Readings from Last 3 Encounters:  09/26/20 121/74  09/05/20 117/70  08/11/20 118/66   Lab Results  Component Value Date   CREATININE 0.72 03/31/2020   CREATININE 0.60 11/17/2019   CREATININE 0.61 03/31/2019   Assessment/Plan:   1. Pre-diabetes  Natalie Hall agreed to start Ozempic 0.25 mg SubQ weekly with no refills.  - Semaglutide,0.25 or 0.5MG /DOS, (OZEMPIC, 0.25 OR 0.5 MG/DOSE,) 2 MG/1.5ML SOPN; Inject 0.25 mg into the skin once a week.  Dispense: 1.5 mL; Refill: 0  2. Essential hypertension Natalie Hall will continue all her medications.  3.  Class 1 obesity with serious comorbidity and body mass index (BMI) of 30.0 to 30.9 in adult, unspecified obesity type Natalie Hall is currently in the action stage of change. As such, her goal is to continue with weight loss efforts. She has agreed to the Category 2 Plan.   Exercise goals: As is.  Behavioral modification strategies: decreasing eating out.  Natalie Hall has agreed to follow-up with our clinic in 4 weeks.  Objective:   Blood pressure 121/74, pulse 61, temperature 98.6 F (37 C), height 5\' 4"  (1.626 m), weight 179 lb (81.2 kg), SpO2 99 %. Body mass index is 30.73 kg/m.  General: Cooperative, alert, well developed, in no acute distress. HEENT: Conjunctivae and lids unremarkable. Cardiovascular: Regular rhythm.  Lungs: Normal work of breathing. Neurologic: No focal deficits.   Lab Results  Component Value Date   CREATININE 0.72 03/31/2020   BUN 23 03/31/2020   NA 135 03/31/2020   K 4.3 03/31/2020   CL 100 03/31/2020   CO2 28 03/31/2020   Lab Results  Component Value Date   ALT 22 03/31/2020   AST 25 03/31/2020   ALKPHOS 42 03/31/2020   BILITOT 0.5 03/31/2020   Lab Results  Component Value Date   HGBA1C 6.0 03/31/2020   HGBA1C 5.6 11/17/2019   HGBA1C 6.0 03/31/2019   HGBA1C 5.7 (H) 11/20/2018   HGBA1C 5.9 (H) 08/04/2018   Lab Results  Component Value Date   INSULIN 18.5 11/17/2019  INSULIN 15.4 11/20/2018   INSULIN 33.5 (H) 08/04/2018   Lab Results  Component Value Date   TSH 1.57 03/31/2020   Lab Results  Component Value Date   CHOL 115 03/31/2020   HDL 29.10 (L) 03/31/2020   LDLCALC 57 03/31/2020   LDLDIRECT 96.0 03/11/2018   TRIG 142.0 03/31/2020   CHOLHDL 4 03/31/2020   Lab Results  Component Value Date   WBC 5.3 03/31/2020   HGB 13.0 03/31/2020   HCT 38.4 03/31/2020   MCV 86.4 03/31/2020   PLT 170.0 03/31/2020   No results found for: IRON, TIBC, FERRITIN  Obesity Behavioral Intervention:   Approximately 15 minutes were spent on the  discussion below.  ASK: We discussed the diagnosis of obesity with Natalie Hall today and Natalie Hall agreed to give [name] permission to discuss obesity behavioral modification therapy today.  ASSESS: Natalie Hall has the diagnosis of obesity and her BMI today is 30.71. Natalie Hall is in the action stage of change.   ADVISE: Natalie Hall was educated on the multiple health risks of obesity as well as the benefit of weight loss to improve her health. She was advised of the need for long term treatment and the importance of lifestyle modifications to improve her current health and to decrease her risk of future health problems.  AGREE: Multiple dietary modification options and treatment options were discussed and Gerilyn agreed to follow the recommendations documented in the above note.  ARRANGE: Reata was educated on the importance of frequent visits to treat obesity as outlined per CMS and USPSTF guidelines and agreed to schedule her next follow up appointment today.  Attestation Statements:   Reviewed by clinician on day of visit: allergies, medications, problem list, medical history, surgical history, family history, social history, and previous encounter notes.   [name], am acting as Location manager for [employer], FNP-C.  I have reviewed the above documentation for accuracy and completeness, and I agree with the above. -  Georgianne Fick, FNP

## 2020-09-30 NOTE — Progress Notes (Signed)
Subjective:   Natalie Hall is a 69 y.o. female who presents for Medicare Annual (Subsequent) preventive examination.  Review of Systems    N/A  Cardiac Risk Factors include: advanced age (>19men, >65 women);hypertension;dyslipidemia;obesity (BMI >30kg/m2)     Objective:    Today's Vitals   10/03/20 0947  BP: 130/86  Temp: 98.5 F (36.9 C)  TempSrc: Oral  Weight: 182 lb 2 oz (82.6 kg)  Height: 5\' 4"  (1.626 m)   Body mass index is 31.26 kg/m.  Advanced Directives 10/03/2020 07/02/2019 06/25/2019 12/18/2018 05/15/2018 06/29/2017 06/28/2017  Does Patient Have a Medical Advance Directive? Yes Yes - Yes Yes - Yes  Type of Advance Directive [place];Living will [place];Living will - [place]  Does patient want to make changes to medical advance directive? No - Patient declined No - Patient declined - - - No - Patient declined -  Copy of [place] in Chart? Yes - validated most recent copy scanned in chart (See row information) Yes - validated most recent copy scanned in chart (See row information) Yes - validated most recent copy scanned in chart (See row information) - - - -  Would patient like information on creating a medical advance directive? - - - - - - -    Current Medications (verified) Outpatient Encounter Medications as of 10/03/2020  Medication Sig  . acetaminophen (TYLENOL) 500 MG tablet Take 500 mg by mouth every 6 (six) hours as needed (pain).   Marland Kitchen aspirin EC 81 MG tablet Take 81 mg by mouth daily.  . Calcium Carb-Cholecalciferol 600-800 MG-UNIT TABS Take 1 tablet by mouth daily.   . cetirizine (ZYRTEC) 10 MG tablet Take 10 mg by mouth daily as needed for allergies.  . diphenhydramine-acetaminophen (TYLENOL PM) 25-500 MG TABS tablet Take 2 tablets by mouth at bedtime as needed (for pain.).  Marland Kitchen ezetimibe (ZETIA) 10 MG tablet TAKE 1 TABLET BY MOUTH  DAILY  . metoprolol succinate (TOPROL-XL) 25  MG 24 hr tablet TAKE 1 TABLET BY MOUTH  TWICE DAILY  . omeprazole (PRILOSEC) 40 MG capsule Take 1 capsule (40 mg total) by mouth daily.  . ramipril (ALTACE) 10 MG capsule TAKE 1 CAPSULE BY MOUTH  DAILY  . rosuvastatin (CRESTOR) 40 MG tablet TAKE 1 TABLET BY MOUTH  DAILY  . Semaglutide,0.25 or 0.5MG /DOS, (OZEMPIC, 0.25 OR 0.5 MG/DOSE,) 2 MG/1.5ML SOPN Inject 0.25 mg into the skin once a week.  . temazepam (RESTORIL) 15 MG capsule TAKE 1 CAPSULE BY MOUTH  EVERY NIGHT AT BEDTIME AS  NEEDED FOR SLEEP  . triamcinolone (NASACORT) 55 MCG/ACT nasal inhaler Place 2 sprays into the nose daily. (Patient taking differently: Place 2 sprays into the nose daily as needed (allergies).)  . cyclobenzaprine (FLEXERIL) 10 MG tablet Take 1 tablet (10 mg total) by mouth 3 (three) times daily as needed for muscle spasms. (Patient not taking: Reported on 10/03/2020)  . metFORMIN (GLUCOPHAGE) 500 MG tablet Take 1 tablet (500 mg total) by mouth daily with breakfast. (Patient not taking: Reported on 10/03/2020)   No facility-administered encounter medications on file as of 10/03/2020.    Allergies (verified) Codeine, Levofloxacin in d5w, Amoxicillin, Amoxicillin-pot clavulanate, and Escitalopram oxalate   History: Past Medical History:  Diagnosis Date  . Asthma    infrequent problem - no treatment since 2006  . Back pain   . Choroidal nevus of left eye    sees Dr. Gerarda Fraction at  [facility].   . Constipation   . Coronary artery disease   . Dysrhythmia    SVT - ablation by Dr. Curt Bears on 05/21/2016 and episodes since  . Fatty liver   . GERD (gastroesophageal reflux disease)   . History of hiatal hernia    "very small"  . Hx of colonic polyp   . Hyperlipidemia   . Hypertension   . Joint pain   . Melanoma of eye, left ([place])    sees Dr. Cordelia Pen  . Myocardial infarction ([place])   . Osteoarthritis   . Shortness of breath   . Supraventricular tachycardia [facility])    Past Surgical History:  Procedure  Laterality Date  . ABDOMINAL HYSTERECTOMY    . ACHILLES TENDON REPAIR  10-07-11   torn on right, per Dr. Noemi Chapel   . ANKLE SURGERY     torn tendon left ankle  . BREAST BIOPSY    . CARDIAC CATHETERIZATION N/A 03/14/2016   Procedure: Left Heart Cath and Coronary Angiography;  Surgeon: Peter M Martinique, MD;  Location: [place] [facility];  Service: Cardiovascular;  Laterality: N/A;  . CARDIAC CATHETERIZATION N/A 03/14/2016   Procedure: Coronary Stent Intervention;  Surgeon: Peter M Martinique, MD;  Location: [place] [facility];  Service: Cardiovascular;  Laterality: N/A;  . CARPAL TUNNEL RELEASE    . CATARACT EXTRACTION W/ INTRAOCULAR LENS IMPLANT Bilateral   . CHOLECYSTECTOMY    . COLONOSCOPY  06/23/2014   per Dr. Ardis Hughs, adenomatous polyps, repeat in 7 yrs    . ELECTROPHYSIOLOGIC STUDY N/A 05/21/2016   Procedure: SVT Ablation;  Surgeon: [name], MD;  Location: [place] [facility];  Service: Cardiovascular;  Laterality: N/A;  . EXCISION HAGLUND'S DEFORMITY WITH ACHILLES TENDON REPAIR Left 07/02/2019   Procedure: Left Achilles tendon debridement and reconstruction and excision of Haglund deformity;  Surgeon: Wylene Simmer, MD;  Location: [place];  Service: Orthopedics;  Laterality: Left;  . EYE SURGERY Left 08/2016  . GASTROCNEMIUS RECESSION Left 07/02/2019   Procedure: Gastroc recession;  Surgeon: Wylene Simmer, MD;  Location: [place];  Service: Orthopedics;  Laterality: Left;  . IR KYPHO THORACIC WITH BONE BIOPSY  12/18/2018  . IR RADIOLOGIST EVAL & MGMT  12/09/2018  . IR RADIOLOGIST EVAL & MGMT  04/30/2019  . KNEE CLOSED REDUCTION Left 02/03/2014   Procedure: CLOSED MANIPULATION LEFT KNEE;  Surgeon: Gearlean Alf, MD;  Location: [facility];  Service: Orthopedics;  Laterality: Left;  . POLYPECTOMY    . REVERSE SHOULDER ARTHROPLASTY     Left  . REVERSE SHOULDER ARTHROPLASTY Left 06/28/2017   Procedure: LEFT SHOULDER REVERSE SHOULDER ARTHROPLASTY;  Surgeon: Netta Cedars, MD;  Location: [place];  Service: Orthopedics;  Laterality: Left;  . TOTAL KNEE ARTHROPLASTY Left 12/21/2013   Procedure: LEFT TOTAL KNEE ARTHROPLASTY;  Surgeon: Gearlean Alf, MD;  Location: [facility];  Service: Orthopedics;  Laterality: Left;  Marland Kitchen VARICOSE VEIN SURGERY     Family History  Problem Relation Age of Onset  . Colon polyps Mother   . Diabetes Mother   . High blood pressure Mother   . High Cholesterol Mother   . Thyroid disease Mother   . Anxiety disorder Mother   . Colon cancer Maternal Aunt   . Liver cancer Maternal Aunt   . Breast cancer Other   . Coronary artery disease Other   . Colon cancer Other   . Diabetes Other   . Hypertension Other   . Kidney  disease Other   . Lung cancer Other   . Lung cancer Brother        spleen, bone  . Diabetes Father   . High blood pressure Father   . High Cholesterol Father   . Heart disease Father   . Depression Father   . Obesity Father   . Esophageal cancer Neg Hx   . Stomach cancer Neg Hx   . Rectal cancer Neg Hx    Social History   Socioeconomic History  . Marital status: Married    Spouse name: Katrina Stack  . Number of children: Not on file  . Years of education: Not on file  . Highest education level: Not on file  Occupational History  . Occupation: retired  Tobacco Use  . Smoking status: Never Smoker  . Smokeless tobacco: Never Used  Vaping Use  . Vaping Use: Never used  Substance and Sexual Activity  . Alcohol use: No    Alcohol/week: 0.0 standard drinks  . Drug use: No  . Sexual activity: Not on file  Other Topics Concern  . Not on file  Social History Narrative  . Not on file   Social Determinants of Health   Financial Resource Strain: Low Risk   . Difficulty of Paying Living Expenses: Not hard at all  Food Insecurity: No Food Insecurity  . Worried About Charity fundraiser in the Last Year: Never true  . Ran Out of Food in the Last Year: Never true  Transportation Needs: No Transportation Needs   . Lack of Transportation (Medical): No  . Lack of Transportation (Non-Medical): No  Physical Activity: Sufficiently Active  . Days of Exercise per Week: 3 days  . Minutes of Exercise per Session: 60 min  Stress: No Stress Concern Present  . Feeling of Stress : Not at all  Social Connections: Socially Integrated  . Frequency of Communication with Friends and Family: More than three times a week  . Frequency of Social Gatherings with Friends and Family: More than three times a week  . Attends Religious Services: More than 4 times per year  . Active Member of Clubs or Organizations: Yes  . Attends Archivist Meetings: More than 4 times per year  . Marital Status: Married    Tobacco Counseling Counseling given: Not Answered   Clinical Intake:  Pre-visit preparation completed: Yes  Pain : No/denies pain     Nutritional Risks: None Diabetes: No  How often do you need to have someone help you when you read instructions, pamphlets, or other written materials from your doctor or pharmacy?: 1 - Never What is the last grade level you completed in school?: Associates Degree  Diabetic?No   Interpreter Needed?: No  Information entered by :: [place] of Daily Living In your present state of health, do you have any difficulty performing the following activities: 10/03/2020  Hearing? N  Vision? N  Difficulty concentrating or making decisions? N  Walking or climbing stairs? N  Dressing or bathing? N  Doing errands, shopping? N  Preparing Food and eating ? N  Using the Toilet? N  In the past six months, have you accidently leaked urine? Y  Comment has occassional bladder due to urgency, wears a pad  Do you have problems with loss of bowel control? N  Managing your Medications? N  Managing your Finances? N  Housekeeping or managing your Housekeeping? N  Some recent data might be hidden    Patient Care  Team: Laurey Morale, MD as PCP -  General Hilty, Nadean Corwin, MD as PCP - Cardiology (Cardiology) Netta Cedars, MD as Consulting Physician (Orthopedic Surgery) Ortho, [place] (Specialist) Pixie Casino, MD as Consulting Physician (Cardiology) [place], Jennefer Bravo, MD as Referring Physician (Dermatology)  Indicate any recent Medical Services you may have received from other than [facility] providers in the past year (date may be approximate).     Assessment:   This is a routine wellness examination for Zniyah.  Hearing/Vision screen  Hearing Screening   125Hz  250Hz  500Hz  1000Hz  2000Hz  3000Hz  4000Hz  6000Hz  8000Hz   Right ear:           Left ear:           Vision Screening Comments: [facility]. Has melanoma to eye. Gets regular eye exam once per day.  Dietary issues and exercise activities discussed: Current Exercise Habits: Structured exercise class, Type of exercise: strength training/weights;walking, Time (Minutes): 60, Frequency (Times/Week): 3, Weekly Exercise (Minutes/Week): 180, Intensity: Moderate, Exercise limited by: None identified  Goals    .  Exercise 150 min/wk Moderate Activity      Keep up exercise at the PT area    .  patient (pt-stated)      Remain as healthy as possible.     .  Patient Stated      I will continue to go to silver sneakers 3x per week for 1 hour    .  Weight (lb) < 200 lb (90.7 kg)      Cardiac rehab  Try the same things you did in while in rehab   Eat more vegetables Stay away from fried foods  Check out  online nutrition programs as GumSearch.nl and http://vang.com/; fit11me; Look for foods with "whole" wheat; bran; oatmeal etc Shot at the farmer's markets in season for fresher choices  Watch for "hydrogenated" on the label of oils which are trans-fats.  Watch for "high fructose corn syrup" in snacks, yogurt or ketchup  Meats have less marbling; bright colored fruits and vegetables;  Canned; dump out  liquid and wash vegetables. Be mindful of what we are eating  Portion control is essential to a health weight! Sit down; take a break and enjoy your meal; take smaller bites; put the fork down between bites;  It takes 20 minutes to get full; so check in with your fullness cues and stop eating when you start to fill full             Depression Screen PHQ 2/9 Scores 10/03/2020 03/31/2019 08/04/2018 05/15/2018 05/14/2017 08/28/2016 05/28/2016  PHQ - 2 Score 0 0 4 0 0 0 0  PHQ- 9 Score 0 - 16 - - - -  Exception Documentation - - Medical reason - - - -    Fall Risk Fall Risk  10/03/2020 03/31/2019 05/15/2018 05/14/2017 05/24/2016  Falls in the past year? 0 1 No No No  Number falls in past yr: 0 0 - - -  Injury with Fall? 0 1 - - -  Risk for fall due to : No Fall Risks - - - -  Follow up Falls evaluation completed;Falls prevention discussed - - - -    FALL RISK PREVENTION PERTAINING TO THE HOME:  Any stairs in or around the home? Yes  If so, are there any without handrails? No  Home free of loose throw rugs in walkways, pet beds, electrical cords, etc? Yes  Adequate lighting in your home to reduce risk of falls? Yes   ASSISTIVE DEVICES UTILIZED TO PREVENT FALLS:  Life alert? No  Use of a cane, walker or w/c? No  Grab bars in the bathroom? Yes  Shower chair or bench in shower? No  Elevated toilet seat or a handicapped toilet? No   TIMED UP AND GO:  Was the test performed? Yes .  Length of time to ambulate 10 feet: 5 sec.   Gait steady and fast without use of assistive device  Cognitive Function: Normal cognitive status assessed by direct observation by this Nurse Health Advisor. No abnormalities found.   MMSE - Mini Mental State Exam 05/15/2018  Not completed: (No Data)        Immunizations Immunization History  Administered Date(s) Administered  . Fluad Quad(high Dose 65+) 07/24/2019  . Influenza Split 08/06/2011, 08/18/2012  . Influenza Whole 08/05/2009, 07/19/2010  .  Influenza, High Dose Seasonal PF 08/15/2015, 08/01/2016, 07/23/2017, 08/19/2018  . Influenza,inj,Quad PF,6+ Mos 07/24/2013  . Influenza,inj,quad, With Preservative 08/05/2014, 07/22/2017  . Influenza-Unspecified 08/30/2020  . PFIZER SARS-COV-2 Vaccination 11/17/2019, 12/08/2019, 08/30/2020  . Pneumococcal Conjugate-13 04/06/2015  . Pneumococcal Polysaccharide-23 10/22/2004  . Pneumococcal-Unspecified 10/23/2015  . Tdap 04/06/2015  . Zoster 04/12/2010  . Zoster Recombinat (Shingrix) 09/21/2017, 09/24/2017, 12/25/2017    TDAP status: Up to date  Flu Vaccine status: Up to date  Pneumococcal vaccine status: Up to date  Covid-19 vaccine status: Completed vaccines  Qualifies for Shingles Vaccine? Yes   Zostavax completed Yes   Shingrix Completed?: Yes  Screening Tests Health Maintenance  Topic Date Due  . COLONOSCOPY  06/24/2019  . COVID-19 Vaccine (4 - Booster for Pfizer series) 02/27/2021  . MAMMOGRAM  02/09/2022  . TETANUS/TDAP  04/05/2025  . INFLUENZA VACCINE  Completed  . DEXA SCAN  Completed  . Hepatitis C Screening  Completed  . PNA vac Low Risk Adult  Completed    Health Maintenance  Health Maintenance Due  Topic Date Due  . COLONOSCOPY  06/24/2019    Colorectal cancer screening: Type of screening: Colonoscopy. Completed 06/23/2014. Repeat every 5 years  Mammogram status: Completed 02/10/2020. Repeat every year  Bone Density status: Completed 12/29/2018. Results reflect: Bone density results: OSTEOPENIA. Repeat every 2 years.  Lung Cancer Screening: (Low Dose CT Chest recommended if Age 46-80 years, 30 pack-year currently smoking OR have quit w/in 15years.) does not qualify.   Lung Cancer Screening Referral: N/A  Additional Screening:  Hepatitis C Screening: does qualify; Completed 12/29/2018  Vision Screening: Recommended annual ophthalmology exams for early detection of glaucoma and other disorders of the eye. Is the patient up to date with their annual  eye exam?  Yes  Who is the provider or what is the name of the office in which the patient attends annual eye exams? Dr. Kathrin Penner  If pt is not established with a provider, would they like to be referred to a provider to establish care? No .   Dental Screening: Recommended annual dental exams for proper oral hygiene  Community Resource Referral / Chronic Care Management: CRR required this visit?  No   CCM required this visit?  No      Plan:     I have personally reviewed and noted the following in the patient's chart:   . Medical and social history . Use of alcohol, tobacco or illicit drugs  . Current medications and supplements . Functional ability and status . Nutritional status . Physical activity . Advanced directives .  List of other physicians . Hospitalizations, surgeries, and ER visits in previous 12 months . Vitals . Screenings to include cognitive, depression, and falls . Referrals and appointments  In addition, I have reviewed and discussed with patient certain preventive protocols, quality metrics, and best practice recommendations. A written personalized care plan for preventive services as well as general preventive health recommendations were provided to patient.     Ofilia Neas, LPN   02/72/[id number]   Nurse Notes: None

## 2020-10-03 ENCOUNTER — Ambulatory Visit (INDEPENDENT_AMBULATORY_CARE_PROVIDER_SITE_OTHER): Payer: Medicare Other

## 2020-10-03 ENCOUNTER — Other Ambulatory Visit: Payer: Self-pay

## 2020-10-03 VITALS — BP 130/86 | Temp 98.5°F | Ht 64.0 in | Wt 182.1 lb

## 2020-10-03 DIAGNOSIS — Z0000 Encounter for general adult medical examination without abnormal findings: Secondary | ICD-10-CM

## 2020-10-03 NOTE — Patient Instructions (Signed)
[name] , Thank you for taking time to come for your Medicare Wellness Visit. I appreciate your ongoing commitment to your health goals. Please review the following plan we discussed and let me know if I can assist you in the future.   Screening recommendations/referrals: Colonoscopy: Currently due, please let us know when you would like for [name] to get you set up for your colonoscopy  Mammogram: Up to date, next due 02/09/2021 Bone Density: Up to date, next due 12/28/2020 Recommended yearly ophthalmology/optometry visit for glaucoma screening and checkup Recommended yearly dental visit for hygiene and checkup  Vaccinations: Influenza vaccine: Up to date, next due fall 2022  Pneumococcal vaccine: Completed series  Tdap vaccine: Up to date, next due 04/05/2025 Shingles vaccine: Completed series     Advanced directives: Copies on file   Conditions/risks identified: none   Next appointment: none    Preventive Care 2 Years and Older, Female Preventive care refers to lifestyle choices and visits with your health care provider that can promote health and wellness. What does preventive care include?  A yearly physical exam. This is also called an annual well check.  Dental exams once or twice a year.  Routine eye exams. Ask your health care provider how often you should have your eyes checked.  Personal lifestyle choices, including:  Daily care of your teeth and gums.  Regular physical activity.  Eating a healthy diet.  Avoiding tobacco and drug use.  Limiting alcohol use.  Practicing safe sex.  Taking low-dose aspirin every day.  Taking vitamin and mineral supplements as recommended by your health care provider. What happens during an annual well check? The services and screenings done by your health care provider during your annual well check will depend on your age, overall health, lifestyle risk factors, and family history of disease. Counseling  Your health care  provider may ask you questions about your:  Alcohol use.  Tobacco use.  Drug use.  Emotional well-being.  Home and relationship well-being.  Sexual activity.  Eating habits.  History of falls.  Memory and ability to understand (cognition).  Work and work Statistician.  Reproductive health. Screening  You may have the following tests or measurements:  Height, weight, and BMI.  Blood pressure.  Lipid and cholesterol levels. These may be checked every 5 years, or more frequently if you are over 44 years old.  Skin check.  Lung cancer screening. You may have this screening every year starting at age 61 if you have a 30-pack-year history of smoking and currently smoke or have quit within the past 15 years.  Fecal occult blood test (FOBT) of the stool. You may have this test every year starting at age 60.  Flexible sigmoidoscopy or colonoscopy. You may have a sigmoidoscopy every 5 years or a colonoscopy every 10 years starting at age 26.  Hepatitis C blood test.  Hepatitis B blood test.  Sexually transmitted disease (STD) testing.  Diabetes screening. This is done by checking your blood sugar (glucose) after you have not eaten for a while (fasting). You may have this done every 1-3 years.  Bone density scan. This is done to screen for osteoporosis. You may have this done starting at age 81.  Mammogram. This may be done every 1-2 years. Talk to your health care provider about how often you should have regular mammograms. Talk with your health care provider about your test results, treatment options, and if necessary, the need for more tests. Vaccines  Your health  care provider may recommend certain vaccines, such as:  Influenza vaccine. This is recommended every year.  Tetanus, diphtheria, and acellular pertussis (Tdap, Td) vaccine. You may need a Td booster every 10 years.  Zoster vaccine. You may need this after age 5.  Pneumococcal 13-valent conjugate (PCV13)  vaccine. One dose is recommended after age 29.  Pneumococcal polysaccharide (PPSV23) vaccine. One dose is recommended after age 42. Talk to your health care provider about which screenings and vaccines you need and how often you need them. This information is not intended to replace advice given to you by your health care provider. Make sure you discuss any questions you have with your health care provider. Document Released: 11/04/2015 Document Revised: 06/27/2016 Document Reviewed: 08/09/2015 Elsevier Interactive Patient Education  2017 [place] Prevention in the Home Falls can cause injuries. They can happen to people of all ages. There are many things you can do to make your home safe and to help prevent falls. What can I do on the outside of my home?  Regularly fix the edges of walkways and driveways and fix any cracks.  Remove anything that might make you trip as you walk through a door, such as a raised step or threshold.  Trim any bushes or trees on the path to your home.  Use bright outdoor lighting.  Clear any walking paths of anything that might make someone trip, such as rocks or tools.  Regularly check to see if handrails are loose or broken. Make sure that both sides of any steps have handrails.  Any raised decks and porches should have guardrails on the edges.  Have any leaves, snow, or ice cleared regularly.  Use sand or salt on walking paths during winter.  Clean up any spills in your garage right away. This includes oil or grease spills. What can I do in the bathroom?  Use night lights.  Install grab bars by the toilet and in the tub and shower. Do not use towel bars as grab bars.  Use non-skid mats or decals in the tub or shower.  If you need to sit down in the shower, use a plastic, non-slip stool.  Keep the floor dry. Clean up any water that spills on the floor as soon as it happens.  Remove soap buildup in the tub or shower  regularly.  Attach bath mats securely with double-sided non-slip rug tape.  Do not have throw rugs and other things on the floor that can make you trip. What can I do in the bedroom?  Use night lights.  Make sure that you have a light by your bed that is easy to reach.  Do not use any sheets or blankets that are too big for your bed. They should not hang down onto the floor.  Have a firm chair that has side arms. You can use this for support while you get dressed.  Do not have throw rugs and other things on the floor that can make you trip. What can I do in the kitchen?  Clean up any spills right away.  Avoid walking on wet floors.  Keep items that you use a lot in easy-to-reach places.  If you need to reach something above you, use a strong step stool that has a grab bar.  Keep electrical cords out of the way.  Do not use floor polish or wax that makes floors slippery. If you must use wax, use non-skid floor wax.  Do not have  throw rugs and other things on the floor that can make you trip. What can I do with my stairs?  Do not leave any items on the stairs.  Make sure that there are handrails on both sides of the stairs and use them. Fix handrails that are broken or loose. Make sure that handrails are as long as the stairways.  Check any carpeting to make sure that it is firmly attached to the stairs. Fix any carpet that is loose or worn.  Avoid having throw rugs at the top or bottom of the stairs. If you do have throw rugs, attach them to the floor with carpet tape.  Make sure that you have a light switch at the top of the stairs and the bottom of the stairs. If you do not have them, ask someone to add them for you. What else can I do to help prevent falls?  Wear shoes that:  Do not have high heels.  Have rubber bottoms.  Are comfortable and fit you well.  Are closed at the toe. Do not wear sandals.  If you use a stepladder:  Make sure that it is fully  opened. Do not climb a closed stepladder.  Make sure that both sides of the stepladder are locked into place.  Ask someone to hold it for you, if possible.  Clearly mark and make sure that you can see:  Any grab bars or handrails.  First and last steps.  Where the edge of each step is.  Use tools that help you move around (mobility aids) if they are needed. These include:  Canes.  Walkers.  Scooters.  Crutches.  Turn on the lights when you go into a dark area. Replace any light bulbs as soon as they burn out.  Set up your furniture so you have a clear path. Avoid moving your furniture around.  If any of your floors are uneven, fix them.  If there are any pets around you, be aware of where they are.  Review your medicines with your doctor. Some medicines can make you feel dizzy. This can increase your chance of falling. Ask your doctor what other things that you can do to help prevent falls. This information is not intended to replace advice given to you by your health care provider. Make sure you discuss any questions you have with your health care provider. Document Released: 08/04/2009 Document Revised: 03/15/2016 Document Reviewed: 11/12/2014 Elsevier Interactive Patient Education  2017 [employer].

## 2020-10-09 ENCOUNTER — Encounter (INDEPENDENT_AMBULATORY_CARE_PROVIDER_SITE_OTHER): Payer: Self-pay | Admitting: Family Medicine

## 2020-10-10 NOTE — Telephone Encounter (Signed)
Last OV with Dawn 

## 2020-10-10 NOTE — Telephone Encounter (Signed)
Please advise 

## 2020-10-23 ENCOUNTER — Other Ambulatory Visit: Payer: Self-pay | Admitting: Internal Medicine

## 2020-10-23 DIAGNOSIS — I10 Essential (primary) hypertension: Secondary | ICD-10-CM

## 2020-10-23 DIAGNOSIS — E782 Mixed hyperlipidemia: Secondary | ICD-10-CM

## 2020-10-24 ENCOUNTER — Other Ambulatory Visit: Payer: Self-pay

## 2020-10-24 ENCOUNTER — Ambulatory Visit (INDEPENDENT_AMBULATORY_CARE_PROVIDER_SITE_OTHER): Payer: Medicare Other | Admitting: Family Medicine

## 2020-10-24 ENCOUNTER — Encounter (INDEPENDENT_AMBULATORY_CARE_PROVIDER_SITE_OTHER): Payer: Self-pay | Admitting: Family Medicine

## 2020-10-24 VITALS — BP 118/72 | HR 71 | Temp 97.9°F | Ht 64.0 in | Wt 179.0 lb

## 2020-10-24 DIAGNOSIS — Z6830 Body mass index (BMI) 30.0-30.9, adult: Secondary | ICD-10-CM | POA: Diagnosis not present

## 2020-10-24 DIAGNOSIS — E669 Obesity, unspecified: Secondary | ICD-10-CM | POA: Diagnosis not present

## 2020-10-24 DIAGNOSIS — K5909 Other constipation: Secondary | ICD-10-CM

## 2020-10-24 DIAGNOSIS — R7303 Prediabetes: Secondary | ICD-10-CM

## 2020-10-24 MED ORDER — OZEMPIC (0.25 OR 0.5 MG/DOSE) 2 MG/1.5ML ~~LOC~~ SOPN: 0.5000 mg | PEN_INJECTOR | SUBCUTANEOUS | 0 refills | Status: DC

## 2020-10-26 ENCOUNTER — Encounter (INDEPENDENT_AMBULATORY_CARE_PROVIDER_SITE_OTHER): Payer: Self-pay | Admitting: Family Medicine

## 2020-10-26 NOTE — Progress Notes (Signed)
Chief Complaint:   OBESITY Natalie Hall is here to discuss her progress with her obesity treatment plan along with follow-up of her obesity related diagnoses. Natalie Hall is on the Category 2 Plan and states she is following her eating plan approximately 60% of the time. Natalie Hall states she is dong silver sneaker 60 minutes 1 time per week.  Today's visit was #: 38 Starting weight: 213 lbs Starting date: 08/04/2018 Today's weight: 179 lbs Today's date: 10/24/2020 Total lbs lost to date: 34 lbs Total lbs lost since last in-office visit: 0  Interim History: Natalie Hall has maintained her weight today and she's happy with this. Natalie Hall has deviated from the plan due to holiday gatherings and eatng out.  Subjective:   1. Pre-diabetes Medications reviewed.  Natalie Hall is on Ozempic but does not notice any appetite suppression. Natalie Hall notes reflux a few days a week, no nausea, and notes constipation.  Lab Results  Component Value Date   HGBA1C 6.0 03/31/2020   HGBA1C 5.6 11/17/2019   HGBA1C 6.0 03/31/2019   Lab Results  Component Value Date   LDLCALC 57 03/31/2020   CREATININE 0.72 03/31/2020   Lab Results  Component Value Date   INSULIN 18.5 11/17/2019   INSULIN 15.4 11/20/2018   INSULIN 33.5 (H) 08/04/2018    2. Other constipation Natalie Hall notes constipation. She takes occasional dulcolax. Does not like miralax, as it causes flatus.     Assessment/Plan:   1. Pre-diabetes Continue meal plan and Ozempic.  - Semaglutide,0.25 or 0.5MG /DOS, (OZEMPIC, 0.25 OR 0.5 MG/DOSE,) 2 MG/1.5ML SOPN; Inject 0.5 mg into the skin once a week.  Dispense: 1 each; Refill: 0  2. Other constipation  Continue dulcolax as needed. Increase water and vegetables.   3. Class 1 obesity with serious comorbidity and body mass index (BMI) of 30.0 to 30.9 in adult, unspecified obesity type  Natalie Hall is currently in the action stage of change. As such, her goal is to continue with weight loss efforts. She has agreed to  the Category 2 Plan.   Exercise goals: Increase exercise with silver sneakers.  Behavioral modification strategies: increasing vegetables and increasing water intake.  Natalie Hall has agreed to follow-up with our clinic in 3 weeks.   Objective:   Blood pressure 118/72, pulse 71, temperature 97.9 F (36.6 C), height 5\' 4"  (1.626 m), weight 179 lb (81.2 kg), SpO2 98 %. Body mass index is 30.73 kg/m.  General: Cooperative, alert, well developed, in no acute distress. HEENT: Conjunctivae and lids unremarkable. Cardiovascular: Regular rhythm.  Lungs: Normal work of breathing. Neurologic: No focal deficits.   Lab Results  Component Value Date   CREATININE 0.72 03/31/2020   BUN 23 03/31/2020   NA 135 03/31/2020   K 4.3 03/31/2020   CL 100 03/31/2020   CO2 28 03/31/2020   Lab Results  Component Value Date   ALT 22 03/31/2020   AST 25 03/31/2020   ALKPHOS 42 03/31/2020   BILITOT 0.5 03/31/2020   Lab Results  Component Value Date   HGBA1C 6.0 03/31/2020   HGBA1C 5.6 11/17/2019   HGBA1C 6.0 03/31/2019   HGBA1C 5.7 (H) 11/20/2018   HGBA1C 5.9 (H) 08/04/2018   Lab Results  Component Value Date   INSULIN 18.5 11/17/2019   INSULIN 15.4 11/20/2018   INSULIN 33.5 (H) 08/04/2018   Lab Results  Component Value Date   TSH 1.57 03/31/2020   Lab Results  Component Value Date   CHOL 115 03/31/2020   HDL 29.10 (L) 03/31/2020  LDLCALC 57 03/31/2020   LDLDIRECT 96.0 03/11/2018   TRIG 142.0 03/31/2020   CHOLHDL 4 03/31/2020   Lab Results  Component Value Date   WBC 5.3 03/31/2020   HGB 13.0 03/31/2020   HCT 38.4 03/31/2020   MCV 86.4 03/31/2020   PLT 170.0 03/31/2020   No results found for: IRON, TIBC, FERRITIN  Obesity Behavioral Intervention:   Approximately 15 minutes were spent on the discussion below.  ASK: We discussed the diagnosis of obesity with Natalie Hall today and Natalie Hall agreed to give [name] permission to discuss obesity behavioral modification therapy  today.  ASSESS: Natalie Hall has the diagnosis of obesity and her BMI today is 30.8. Natalie Hall is in the action stage of change.   ADVISE: Natalie Hall was educated on the multiple health risks of obesity as well as the benefit of weight loss to improve her health. She was advised of the need for long term treatment and the importance of lifestyle modifications to improve her current health and to decrease her risk of future health problems.  AGREE: Multiple dietary modification options and treatment options were discussed and Natalie Hall agreed to follow the recommendations documented in the above note.  ARRANGE: Natalie Hall was educated on the importance of frequent visits to treat obesity as outlined per CMS and USPSTF guidelines and agreed to schedule her next follow up appointment today.  Attestation Statements:   Reviewed by clinician on day of visit: allergies, medications, problem list, medical history, surgical history, family history, social history, and previous encounter notes.   Tula Nakayama, am acting as Location manager for Georgianne Fick, NP.  I have reviewed the above documentation for accuracy and completeness, and I agree with the above. -  Georgianne Fick, FNP

## 2020-11-15 ENCOUNTER — Ambulatory Visit (INDEPENDENT_AMBULATORY_CARE_PROVIDER_SITE_OTHER): Payer: Medicare Other | Admitting: Family Medicine

## 2020-11-15 ENCOUNTER — Encounter (INDEPENDENT_AMBULATORY_CARE_PROVIDER_SITE_OTHER): Payer: Self-pay | Admitting: Family Medicine

## 2020-11-15 ENCOUNTER — Other Ambulatory Visit: Payer: Self-pay

## 2020-11-15 VITALS — BP 112/70 | HR 72 | Temp 97.9°F | Ht 64.0 in | Wt 178.0 lb

## 2020-11-15 DIAGNOSIS — R7303 Prediabetes: Secondary | ICD-10-CM

## 2020-11-15 DIAGNOSIS — E669 Obesity, unspecified: Secondary | ICD-10-CM

## 2020-11-15 DIAGNOSIS — I2510 Atherosclerotic heart disease of native coronary artery without angina pectoris: Secondary | ICD-10-CM

## 2020-11-15 MED ORDER — OZEMPIC (1 MG/DOSE) 2 MG/1.5ML ~~LOC~~ SOPN
1.0000 mg | PEN_INJECTOR | SUBCUTANEOUS | 0 refills | Status: DC
Start: 2020-11-15 — End: 2020-12-06

## 2020-11-17 NOTE — Progress Notes (Signed)
Chief Complaint:   OBESITY Natalie Hall is here to discuss her progress with her obesity treatment plan along with follow-up of her obesity related diagnoses. Natalie Hall is on the Category 2 Plan and states she is following her eating plan approximately 80% of the time. Natalie Hall states she is doing 0 minutes 0 times per week.  Today's visit was #: 31 Starting weight: 213 lbs Starting date: 08/04/2018 Today's weight: 178 lbs Today's date: 11/15/2020 Total lbs lost to date: 35 Total lbs lost since last in-office visit: 1  Interim History: Natalie Hall reports Silver Sneakers has been cancelled due to weather. She feels she is eating less due to Ozempic. She does not always get all of her protein in at supper due to increased satiety. She has 4 oz of meat at supper.  Subjective:   1. Pre-diabetes Natalie Hall has tolerated Ozempic well  and she feels Ozempic has increased satiety. She has had mild heartburn with Ozempic. She continues to work on diet and exercise to decrease her risk of diabetes. She denies nausea or hypoglycemia.  Lab Results  Component Value Date   HGBA1C 6.0 03/31/2020   Lab Results  Component Value Date   INSULIN 18.5 11/17/2019   INSULIN 15.4 11/20/2018   INSULIN 33.5 (H) 08/04/2018   2. Coronary artery disease, unspecified vessel or lesion type, unspecified whether angina present, unspecified whether native or transplanted heart Natalie Hall is followed by Dr. Debara Pickett. She has a history of supraventricular tachycardia and coronary artery disease. Her blood pressure is well controlled. Last LDL was at goal at 57. She is on 40 mg of Crestor, and she denies myalgias.  Assessment/Plan:   1. Pre-diabetes . Natalie Hall agreed to increase Ozempic to 1 mg weekly with no refills.  - Semaglutide, 1 MG/DOSE, (OZEMPIC, 1 MG/DOSE,) 2 MG/1.5ML SOPN; Inject 1 mg into the skin once a week.  Dispense: 3 mL; Refill: 0  2. Coronary artery disease, unspecified vessel or lesion type, unspecified whether  angina present, unspecified whether native or transplanted heart Natalie Hall will follow up with Dr. Debara Pickett, and will continue metoprolol, Crestor, and Altace.  3. Class 1 obesity with serious comorbidity and body mass index (BMI) of 30.0 to 30.9 in adult, unspecified obesity type Natalie Hall is currently in the action stage of change. As such, her goal is to continue with weight loss efforts. She has agreed to the Category 2 Plan. Add extra yogurt to increase protein.  We will recheck fasting labs at her next office visit.  Exercise goals: Resume Silver Sneakers.  Behavioral modification strategies: increasing lean protein intake.  Natalie Hall has agreed to follow-up with our clinic in 3 weeks.   Objective:   Blood pressure 112/70, pulse 72, temperature 97.9 F (36.6 C), height 5\' 4"  (1.626 m), weight 178 lb (80.7 kg), SpO2 98 %. Body mass index is 30.55 kg/m.  General: Cooperative, alert, well developed, in no acute distress. HEENT: Conjunctivae and lids unremarkable. Cardiovascular: Regular rhythm.  Lungs: Normal work of breathing. Neurologic: No focal deficits.   Lab Results  Component Value Date   CREATININE 0.72 03/31/2020   BUN 23 03/31/2020   NA 135 03/31/2020   K 4.3 03/31/2020   CL 100 03/31/2020   CO2 28 03/31/2020   Lab Results  Component Value Date   ALT 22 03/31/2020   AST 25 03/31/2020   ALKPHOS 42 03/31/2020   BILITOT 0.5 03/31/2020   Lab Results  Component Value Date   HGBA1C 6.0 03/31/2020   HGBA1C 5.6  11/17/2019   HGBA1C 6.0 03/31/2019   HGBA1C 5.7 (H) 11/20/2018   HGBA1C 5.9 (H) 08/04/2018   Lab Results  Component Value Date   INSULIN 18.5 11/17/2019   INSULIN 15.4 11/20/2018   INSULIN 33.5 (H) 08/04/2018   Lab Results  Component Value Date   TSH 1.57 03/31/2020   Lab Results  Component Value Date   CHOL 115 03/31/2020   HDL 29.10 (L) 03/31/2020   LDLCALC 57 03/31/2020   LDLDIRECT 96.0 03/11/2018   TRIG 142.0 03/31/2020   CHOLHDL 4 03/31/2020    Lab Results  Component Value Date   WBC 5.3 03/31/2020   HGB 13.0 03/31/2020   HCT 38.4 03/31/2020   MCV 86.4 03/31/2020   PLT 170.0 03/31/2020   No results found for: IRON, TIBC, FERRITIN  Obesity Behavioral Intervention:   Approximately 15 minutes were spent on the discussion below.  ASK: We discussed the diagnosis of obesity with Natalie Hall today and Natalie Hall agreed to give [name] permission to discuss obesity behavioral modification therapy today.  ASSESS: Natalie Hall has the diagnosis of obesity and her BMI today is 30.54. Natalie Hall is in the action stage of change.   ADVISE: Natalie Hall was educated on the multiple health risks of obesity as well as the benefit of weight loss to improve her health. She was advised of the need for long term treatment and the importance of lifestyle modifications to improve her current health and to decrease her risk of future health problems.  AGREE: Multiple dietary modification options and treatment options were discussed and Natalie Hall agreed to follow the recommendations documented in the above note.  ARRANGE: Natalie Hall was educated on the importance of frequent visits to treat obesity as outlined per CMS and USPSTF guidelines and agreed to schedule her next follow up appointment today.  Attestation Statements:   Reviewed by clinician on day of visit: allergies, medications, problem list, medical history, surgical history, family history, social history, and previous encounter notes.   [name], am acting as Location manager for [employer], FNP-C.  I have reviewed the above documentation for accuracy and completeness, and I agree with the above. -  Georgianne Fick, FNP

## 2020-11-21 ENCOUNTER — Encounter (INDEPENDENT_AMBULATORY_CARE_PROVIDER_SITE_OTHER): Payer: Self-pay | Admitting: Family Medicine

## 2020-12-06 ENCOUNTER — Ambulatory Visit (INDEPENDENT_AMBULATORY_CARE_PROVIDER_SITE_OTHER): Payer: Medicare Other | Admitting: Family Medicine

## 2020-12-06 ENCOUNTER — Encounter (INDEPENDENT_AMBULATORY_CARE_PROVIDER_SITE_OTHER): Payer: Self-pay | Admitting: Family Medicine

## 2020-12-06 ENCOUNTER — Other Ambulatory Visit: Payer: Self-pay

## 2020-12-06 VITALS — BP 114/55 | HR 75 | Temp 98.0°F | Ht 64.0 in | Wt 178.0 lb

## 2020-12-06 DIAGNOSIS — R7303 Prediabetes: Secondary | ICD-10-CM | POA: Diagnosis not present

## 2020-12-06 DIAGNOSIS — E559 Vitamin D deficiency, unspecified: Secondary | ICD-10-CM

## 2020-12-06 DIAGNOSIS — E7849 Other hyperlipidemia: Secondary | ICD-10-CM

## 2020-12-06 DIAGNOSIS — Z6830 Body mass index (BMI) 30.0-30.9, adult: Secondary | ICD-10-CM

## 2020-12-06 DIAGNOSIS — E669 Obesity, unspecified: Secondary | ICD-10-CM

## 2020-12-06 MED ORDER — METFORMIN HCL 500 MG PO TABS: 500.0000 mg | ORAL_TABLET | Freq: Every day | ORAL | 0 refills | Status: DC

## 2020-12-07 ENCOUNTER — Encounter (INDEPENDENT_AMBULATORY_CARE_PROVIDER_SITE_OTHER): Payer: Self-pay | Admitting: Family Medicine

## 2020-12-07 LAB — LIPID PANEL WITH LDL/HDL RATIO
Cholesterol, Total: 106 mg/dL (ref 100–199)
HDL: 28 mg/dL — ABNORMAL LOW (ref >39)
LDL Chol Calc (NIH): 53 mg/dL (ref 0–99)
LDL/HDL Ratio: 1.9 ratio (ref 0.0–3.2)
Triglycerides: 141 mg/dL (ref 0–149)
VLDL Cholesterol Cal: 25 mg/dL (ref 5–40)

## 2020-12-07 LAB — COMPREHENSIVE METABOLIC PANEL
ALT: 37 IU/L — ABNORMAL HIGH (ref 0–32)
AST: 32 IU/L (ref 0–40)
Albumin/Globulin Ratio: 1.3 (ref 1.2–2.2)
Albumin: 4.2 g/dL (ref 3.8–4.8)
Alkaline Phosphatase: 50 IU/L (ref 44–121)
BUN/Creatinine Ratio: 25 (ref 12–28)
BUN: 15 mg/dL (ref 8–27)
Bilirubin Total: 0.3 mg/dL (ref 0.0–1.2)
CO2: 22 mmol/L (ref 20–29)
Calcium: 9.4 mg/dL (ref 8.7–10.3)
Chloride: 100 mmol/L (ref 96–106)
Creatinine, Ser: 0.59 mg/dL (ref 0.57–1.00)
GFR calc Af Amer: 107 mL/min/{1.73_m2} (ref >59)
GFR calc non Af Amer: 93 mL/min/{1.73_m2} (ref >59)
Globulin, Total: 3.3 g/dL (ref 1.5–4.5)
Glucose: 87 mg/dL (ref 65–99)
Potassium: 4.7 mmol/L (ref 3.5–5.2)
Sodium: 138 mmol/L (ref 134–144)
Total Protein: 7.5 g/dL (ref 6.0–8.5)

## 2020-12-07 LAB — INSULIN, RANDOM: INSULIN: 16.6 u[IU]/mL (ref 2.6–24.9)

## 2020-12-07 LAB — HEMOGLOBIN A1C
Est. average glucose Bld gHb Est-mCnc: 111 mg/dL
Hgb A1c MFr Bld: 5.5 % (ref 4.8–5.6)

## 2020-12-07 LAB — VITAMIN D 25 HYDROXY (VIT D DEFICIENCY, FRACTURES): Vit D, 25-Hydroxy: 51.1 ng/mL (ref 30.0–100.0)

## 2020-12-07 NOTE — Progress Notes (Signed)
Chief Complaint:   OBESITY Natalie Hall is here to discuss her progress with her obesity treatment plan along with follow-up of her obesity related diagnoses. Natalie Hall is on the Category 2 Plan and states she is following her eating plan approximately 80% of the time. Natalie Hall states she is doing Silver Sneakers 60 minutes 3 times per week.  Today's visit was #: 47 Starting weight: 213 lbs Starting date: 08/04/2018 Today's weight: 178 lbs Today's date: 12/06/2020 Total lbs lost to date: 35 lbs Total lbs lost since last in-office visit: 0  Interim History: Natalie Hall never felt that the Ozempic helped with hunger but it did help with satiety. She does not fee she exceeds extra calories. She drinks water and diet ginger ale. Her weight has been plateaued for 1.5 years. Category 1 meal plan leaves her hungry so we have stuck with Cat 2.  Subjective:   1. Pre-diabetes Natalie Hall's last A1c was 6.0. She is on Ozempic 1 mg weekly. She notes constipation and GI upset due to Ozempic which has not subsided. She does not feel it has offered beenfit for appetite.   2. Other hyperlipidemia Natalie Hall's LDL was at goal at 57 on last check. Her HDL was low at 29. She is on Crestor 40 mg. Her HLD is managed by cardiology. She has a history of NSTEMI. She has started exercising over the past few months. She denies chest pain.  Lab Results  Component Value Date   CHOL 106 12/06/2020   HDL 28 (L) 12/06/2020   LDLCALC 53 12/06/2020   LDLDIRECT 96.0 03/11/2018   TRIG 141 12/06/2020   CHOLHDL 4 03/31/2020     3. Vitamin D deficiency Natalie Hall's Vit D was over-replaced at 105 when last checked on 03/31/2020. She has been on an OTC calcium with Vit D and multivitamin daily.   Assessment/Plan:   1. Pre-diabetes  Discontinue Ozempic. Start back on Metformin 500 mg every morning. Check fasting labs today.  - Hemoglobin A1c - Insulin, random - Comprehensive metabolic panel  2. Other hyperlipidemia Check labs today.  Continue Crestor. Follow up with cardiology next week.  - Lipid Panel With LDL/HDL Ratio  3. Vitamin D deficiency  She agrees to continue to take OTC supplements and will follow-up for routine testing of Vitamin D, at least 2-3 times per year to avoid over-replacement. Check labs today.  - VITAMIN D 25 Hydroxy (Vit-D Deficiency, Fractures)  4. Class 1 obesity with serious comorbidity and body mass index (BMI) of 30.0 to 30.9 in adult, unspecified obesity type Natalie Hall is currently in the action stage of change. As such, her goal is to continue with weight loss efforts. She has agreed to the Category 2 Plan.   Exercise goals: As is. Try to add walking on days she does not do Silver Sneakers.  Behavioral modification strategies: decreasing simple carbohydrates.  Natalie Hall has agreed to follow-up with our clinic in 3 weeks.  Natalie Hall was informed we would discuss her lab results at her next visit unless there is a critical issue that needs to be addressed sooner. Natalie Hall agreed to keep her next visit at the agreed upon time to discuss these results.  Objective:   Blood pressure (!) 114/55, pulse 75, temperature 98 F (36.7 C), height 5\' 4"  (1.626 m), weight 178 lb (80.7 kg), SpO2 97 %. Body mass index is 30.55 kg/m.  General: Cooperative, alert, well developed, in no acute distress. HEENT: Conjunctivae and lids unremarkable. Cardiovascular: Regular rhythm.  Lungs: Normal work  of breathing. Neurologic: No focal deficits.   Lab Results  Component Value Date   CREATININE 0.59 12/06/2020   BUN 15 12/06/2020   NA 138 12/06/2020   K 4.7 12/06/2020   CL 100 12/06/2020   CO2 22 12/06/2020   Lab Results  Component Value Date   ALT 37 (H) 12/06/2020   AST 32 12/06/2020   ALKPHOS 50 12/06/2020   BILITOT 0.3 12/06/2020   Lab Results  Component Value Date   HGBA1C 5.5 12/06/2020   HGBA1C 6.0 03/31/2020   HGBA1C 5.6 11/17/2019   HGBA1C 6.0 03/31/2019   HGBA1C 5.7 (H) 11/20/2018   Lab  Results  Component Value Date   INSULIN 16.6 12/06/2020   INSULIN 18.5 11/17/2019   INSULIN 15.4 11/20/2018   INSULIN 33.5 (H) 08/04/2018   Lab Results  Component Value Date   TSH 1.57 03/31/2020   Lab Results  Component Value Date   CHOL 106 12/06/2020   HDL 28 (L) 12/06/2020   LDLCALC 53 12/06/2020   LDLDIRECT 96.0 03/11/2018   TRIG 141 12/06/2020   CHOLHDL 4 03/31/2020   Lab Results  Component Value Date   WBC 5.3 03/31/2020   HGB 13.0 03/31/2020   HCT 38.4 03/31/2020   MCV 86.4 03/31/2020   PLT 170.0 03/31/2020   No results found for: IRON, TIBC, FERRITIN  Obesity Behavioral Intervention:   Approximately 15 minutes were spent on the discussion below.  ASK: We discussed the diagnosis of obesity with Natalie Hall today and Natalie Hall agreed to give [name] permission to discuss obesity behavioral modification therapy today.  ASSESS: Natalie Hall has the diagnosis of obesity and her BMI today is 30.6. Natalie Hall is in the action stage of change.   ADVISE: Natalie Hall was educated on the multiple health risks of obesity as well as the benefit of weight loss to improve her health. She was advised of the need for long term treatment and the importance of lifestyle modifications to improve her current health and to decrease her risk of future health problems.  AGREE: Multiple dietary modification options and treatment options were discussed and Natalie Hall agreed to follow the recommendations documented in the above note.  ARRANGE: Natalie Hall was educated on the importance of frequent visits to treat obesity as outlined per CMS and USPSTF guidelines and agreed to schedule her next follow up appointment today.  Attestation Statements:   Reviewed by clinician on day of visit: allergies, medications, problem list, medical history, surgical history, family history, social history, and previous encounter notes.  Coral Ceo, am acting as Location manager for [employer], [place].  I have reviewed the  above documentation for accuracy and completeness, and I agree with the above. -  Georgianne Fick, FNP

## 2020-12-09 ENCOUNTER — Encounter (INDEPENDENT_AMBULATORY_CARE_PROVIDER_SITE_OTHER): Payer: Self-pay | Admitting: Family Medicine

## 2020-12-12 NOTE — Telephone Encounter (Signed)
Please advise 

## 2020-12-12 NOTE — Telephone Encounter (Signed)
FYI

## 2020-12-12 NOTE — Telephone Encounter (Signed)
Last seen Dawn 

## 2020-12-14 ENCOUNTER — Other Ambulatory Visit: Payer: Self-pay

## 2020-12-14 ENCOUNTER — Encounter: Payer: Self-pay | Admitting: Internal Medicine

## 2020-12-14 ENCOUNTER — Ambulatory Visit (INDEPENDENT_AMBULATORY_CARE_PROVIDER_SITE_OTHER): Payer: Medicare Other | Admitting: Internal Medicine

## 2020-12-14 VITALS — BP 98/68 | HR 70 | Ht 64.5 in | Wt 182.0 lb

## 2020-12-14 DIAGNOSIS — I10 Essential (primary) hypertension: Secondary | ICD-10-CM | POA: Diagnosis not present

## 2020-12-14 DIAGNOSIS — E782 Mixed hyperlipidemia: Secondary | ICD-10-CM

## 2020-12-14 DIAGNOSIS — I2510 Atherosclerotic heart disease of native coronary artery without angina pectoris: Secondary | ICD-10-CM

## 2020-12-14 NOTE — Patient Instructions (Signed)
Medication Instructions:  Your physician recommends that you continue on your current medications as directed. Please refer to the Current Medication list given to you today.  *If you need a refill on your cardiac medications before your next appointment, please call your pharmacy*   Lab Work: Hepatic Function Panel in 3 months   If you have labs (blood work) drawn today and your tests are completely normal, you will receive your results only by: Marland Kitchen MyChart Message (if you have MyChart) OR . A paper copy in the mail If you have any lab test that is abnormal or we need to change your treatment, we will call you to review the results.   Testing/Procedures: NONE   Follow-Up: At [facility], you and your health needs are our priority.  As part of our continuing mission to provide you with exceptional heart care, we have created designated Provider Care Teams.  These Care Teams include your primary Cardiologist (physician) and Advanced Practice Providers (APPs -  Physician Assistants and Nurse Practitioners) who all work together to provide you with the care you need, when you need it.  We recommend signing up for the patient portal called "MyChart".  Sign up information is provided on this After Visit Summary.  MyChart is used to connect with patients for Virtual Visits (Telemedicine).  Patients are able to view lab/test results, encounter notes, upcoming appointments, etc.  Non-urgent messages can be sent to your provider as well.   To learn more about what you can do with MyChart, go to [email or web address].    Your next appointment:   12 month(s)  The format for your next appointment:   In Person  Provider:   You may see Pixie Casino, MD or one of the following Advanced Practice Providers on your designated Care Team:    Almyra Deforest, PA-C  Fabian Sharp, PA-C or   Roby Lofts, [place]    Other Instructions

## 2020-12-14 NOTE — Progress Notes (Signed)
OFFICE NOTE  Chief Complaint:  Routine follow-up  Primary Care Physician: Laurey Morale, MD  HPI:  Natalie Hall is a 70 y.o. female who I last saw in the hospital a few weeks ago. She was admitted for an SVT which spontaneously converted in the ER on Cardizem and did not return. Unfortunately she had a non-ST elevation MI with troponin peak of 1.64. She underwent a Lexiscan Myoview which demonstrated a moderate region of reversibility in the mid and apical segments of the inferior lateral [place] consistent with ischemia. EF was 61%. She then was referred to cardiac catheterization which demonstrated the following:  Cardiac Cath 05/24   Prox RCA lesion, 25% stenosed.  The left ventricular systolic function is normal.  Prox LAD to Mid LAD lesion, 15% stenosed.  Mid RCA-1 lesion, 95% stenosed.  Mid RCA-2 lesion, 95% stenosed. Post intervention, there is a 0% residual stenosis.SYNERGY DES 3X32 1. Single vessel obstructive CAD 2. Normal LV function 3. Successful stenting of the mid to distal RCA with a DES, SYNERGY DES 3X32 Plan: DAPT for one year. Patient may be a candidate for the TWILIGHT trial in which case study protocol will be followed. Anticipate DC in am.  Natalie Hall returns today and is doing quite well. She denies any chest pain or worsening shortness of breath. She is compliant with her medications. She did enroll in the TWILIGHT trial. Unfortunately, she was considering shoulder surgery but this will be delayed due to the necessity of dual antiplatelet therapy with her recent stent. I advised her the Tylenol or her tramadol would be safe medicines used for pain.  07/13/2016  Since I last saw her, she was admitted to the hospital for adenosine-responsive AVNRT. She underwent successful catheter ablation and has not had recurrence. She denies any chest pain or dyspnea. She remains in the TWILIGHT trial with Brillinta. Recent lipid profilw shows LDL remains not at goal at  104. HDL low at 28. She is on Crestor 10 mg daily.  10/05/2016  Natalie Hall returns today for follow-up. She seems to be doing well from a coronary standpoint. She denies any chest pain or worsening dyspnea. 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 She underwent placement of a radioactive seed which was ultimately removed after a week and she is recovering from that procedure. She was off of her aspirin for 1 week but remained on Brilinta. She is still in the twilight trial. Her cholesterol was higher than it had been previously. I increased her Crestor to 20 mg daily and we are awaiting her lipid profile which was recently drawn. She denies any recurrent AVNRT.  06/10/2017  Natalie Hall was seen today in follow-up. She was seen in the [facility] for follow-up of the Twilight trial - this has completed. She is now more than one year since drug-eluting stent placement. She is angina free. At this point she will no longer need dual antiplatelet therapy and I advised that she go on aspirin 81 mg daily. Cholesterol is now at goal based on labs 3 months ago which showed total cholesterol 139, triglycerides 177, HL 34 and LDL-C is 69. She is on Crestor 40 mg daily. She is contemplating left shoulder surgery and from my standpoint she is clear for that.  12/30/2017  Natalie Hall was seen today in follow-up.  This is an annual follow-up.  Overall she is doing well.  She had a successful shoulder surgery and  has good range of motion in her left shoulder.  She has been under a lot of stress taking care of her mother-in-law who recently passed away.  She did have labs in December which showed total cholesterol 146, triglycerides 196, HDL 32 and LDL 75.  She does report she had some weight gain and decreased activity and I feel like her cholesterol could be better controlled with more vigilant diet.  She denies any recurrent chest pain or worsening shortness of  breath.  07/14/2018  Natalie Hall is seen today for routine follow-up. It has been 6 months with a plan for weight loss and dietary changes to get cholesterol to goal. Unfortunately, weight is not significantly different. Repeat lipid profile shows cholesterol is higher with TC 157, TG 241, HDL 28 a nd LDL 81. Her goal LDL is <70. I had not recommended additional medication due to her commitment to making dietary changes, however, at this point, she still has additional residual risk and her cholesterol is actually higher, so I'm recommending therapy.  06/08/2019  Natalie Hall is seen today in follow-up.  Overall she seems to be doing well.  She has been working closely with the weight management center at [facility] and is lost a significant weight.  She went from 213 pounds down to 183 in the office today however she says weighs in the 170s at home.  Overall she remains asymptomatic, denies chest pain or worsening shortness of breath.  EKG shows a sinus bradycardia in the 50s today.  Her labs all look excellent recently.  Lipids 2 months ago showed total cholesterol 105, triglycerides 148, HDL 26 and LDL 49. Also, she is seeking clearance for achilles surgery with Dr. Doran Durand.  12/03/2019  Natalie Hall is seen today in follow-up.  She continues to do well.  She did have her Achilles tendon repair.  She was not quite as active but recently has picked that up.  She continues to work with the weight management center.  Her weight is slightly trended up but in general is down.  Her cholesterol is much better.  Labs 2 weeks ago showed total cholesterol 110, triglycerides 123, HDL 29 LDL 59.  She denies any chest pain.  She has had no further arrhythmias.   12/14/2020  Natalie Hall returns today for follow-up.  Overall she is doing well.  She had a recent lipid profile showing total cholesterol 106, triglycerides 141, Chelle 28 and LDL 53.  She was concerned about a mild elevation in ALT to 37.  This is previously better  controlled.  She said recent imaging showed some fatty liver disease.  She said she has a history of a melanoma and was concerned that this could indicate an issue such as a tumor, however I feel that this is very unlikely.  She denies any recurrent tachypalpitations.  Blood pressure was noted to be low today however she recently was on Ozempic but did not tolerate it for weight loss.  PMHx:  Past Medical History:  Diagnosis Date  . Asthma    infrequent problem - no treatment since 2006  . Back pain   . Choroidal nevus of left eye    sees Dr. Gerarda Fraction at [facility].   . Constipation   . Coronary artery disease   . Dysrhythmia    SVT - ablation by Dr. Curt Bears on 05/21/2016 and episodes since  . Fatty liver   . GERD (gastroesophageal reflux disease)   . History of hiatal  hernia    "very small"  . Hx of colonic polyp   . Hyperlipidemia   . Hypertension   . Joint pain   . Melanoma of eye, left ([place])    sees Dr. Cordelia Pen  . Myocardial infarction ([place])   . Osteoarthritis   . Shortness of breath   . Supraventricular tachycardia [facility])     Past Surgical History:  Procedure Laterality Date  . ABDOMINAL HYSTERECTOMY    . ACHILLES TENDON REPAIR  10-07-11   torn on right, per Dr. Noemi Chapel   . ANKLE SURGERY     torn tendon left ankle  . BREAST BIOPSY    . CARDIAC CATHETERIZATION N/A 03/14/2016   Procedure: Left Heart Cath and Coronary Angiography;  Surgeon: Peter M Martinique, MD;  Location: [place] [facility];  Service: Cardiovascular;  Laterality: N/A;  . CARDIAC CATHETERIZATION N/A 03/14/2016   Procedure: Coronary Stent Intervention;  Surgeon: Peter M Martinique, MD;  Location: [place] [facility];  Service: Cardiovascular;  Laterality: N/A;  . CARPAL TUNNEL RELEASE    . CATARACT EXTRACTION W/ INTRAOCULAR LENS IMPLANT Bilateral   . CHOLECYSTECTOMY    . COLONOSCOPY  06/23/2014   per Dr. Ardis Hughs, adenomatous polyps, repeat in 7 yrs    . ELECTROPHYSIOLOGIC STUDY N/A 05/21/2016   Procedure:  SVT Ablation;  Surgeon: [name], MD;  Location: [place] [facility];  Service: Cardiovascular;  Laterality: N/A;  . EXCISION HAGLUND'S DEFORMITY WITH ACHILLES TENDON REPAIR Left 07/02/2019   Procedure: Left Achilles tendon debridement and reconstruction and excision of Haglund deformity;  Surgeon: Wylene Simmer, MD;  Location: [place];  Service: Orthopedics;  Laterality: Left;  . EYE SURGERY Left 08/2016  . GASTROCNEMIUS RECESSION Left 07/02/2019   Procedure: Gastroc recession;  Surgeon: Wylene Simmer, MD;  Location: [place];  Service: Orthopedics;  Laterality: Left;  . IR KYPHO THORACIC WITH BONE BIOPSY  12/18/2018  . IR RADIOLOGIST EVAL & MGMT  12/09/2018  . IR RADIOLOGIST EVAL & MGMT  04/30/2019  . KNEE CLOSED REDUCTION Left 02/03/2014   Procedure: CLOSED MANIPULATION LEFT KNEE;  Surgeon: Gearlean Alf, MD;  Location: [facility];  Service: Orthopedics;  Laterality: Left;  . POLYPECTOMY    . REVERSE SHOULDER ARTHROPLASTY     Left  . REVERSE SHOULDER ARTHROPLASTY Left 06/28/2017   Procedure: LEFT SHOULDER REVERSE SHOULDER ARTHROPLASTY;  Surgeon: Netta Cedars, MD;  Location: [place];  Service: Orthopedics;  Laterality: Left;  . TOTAL KNEE ARTHROPLASTY Left 12/21/2013   Procedure: LEFT TOTAL KNEE ARTHROPLASTY;  Surgeon: Gearlean Alf, MD;  Location: [facility];  Service: Orthopedics;  Laterality: Left;  Marland Kitchen VARICOSE VEIN SURGERY      FAMHx:  Family History  Problem Relation Age of Onset  . Colon polyps Mother   . Diabetes Mother   . High blood pressure Mother   . High Cholesterol Mother   . Thyroid disease Mother   . Anxiety disorder Mother   . Colon cancer Maternal Aunt   . Liver cancer Maternal Aunt   . Breast cancer Other   . Coronary artery disease Other   . Colon cancer Other   . Diabetes Other   . Hypertension Other   . Kidney disease Other   . Lung cancer Other   . Lung cancer Brother        spleen, bone  . Diabetes Father   . High blood  pressure Father   . High Cholesterol Father   . Heart  disease Father   . Depression Father   . Obesity Father   . Esophageal cancer Neg Hx   . Stomach cancer Neg Hx   . Rectal cancer Neg Hx     SOCHx:   reports that she has never smoked. She has never used smokeless tobacco. She reports that she does not drink alcohol and does not use drugs.  ALLERGIES:  Allergies  Allergen Reactions  . Codeine Other (See Comments), Shortness Of Breath and Rash    flushing flushing   . Levofloxacin In D5w Other (See Comments)    Leg pain  Leg pain   . Amoxicillin   . Amoxicillin-Pot Clavulanate Rash    Has patient had a PCN reaction causing immediate rash, facial/tongue/throat swelling, SOB or lightheadedness with hypotension: No Has patient had a PCN reaction causing severe rash involving mucus membranes or skin necrosis: No Has patient had a PCN reaction that required hospitalization: No Has patient had a PCN reaction occurring within the last 10 years: Yes If all of the above answers are "NO", then may proceed with Cephalosporin use.  Has patient had a PCN reaction causing immediate rash, facial/tongue/throat swelling, SOB or lightheadedness with hypotension: No Has patient had a PCN reaction causing severe rash involving mucus membranes or skin necrosis: No Has patient had a PCN reaction that required hospitalization: No Has patient had a PCN reaction occurring within the last 10 years: Yes If all of the above answers are "NO", then may proceed with Cephalosporin use.  . Escitalopram Oxalate Rash    ROS: Pertinent items noted in HPI and remainder of comprehensive ROS otherwise negative.  HOME MEDS: Current Outpatient Medications  Medication Sig Dispense Refill  . acetaminophen (TYLENOL) 500 MG tablet Take 500 mg by mouth every 6 (six) hours as needed (pain).     Marland Kitchen aspirin EC 81 MG tablet Take 81 mg by mouth daily.    . Calcium Carb-Cholecalciferol 600-800 MG-UNIT TABS Take 1 tablet  by mouth daily.     . cetirizine (ZYRTEC) 10 MG tablet Take 10 mg by mouth daily as needed for allergies.    . diphenhydramine-acetaminophen (TYLENOL PM) 25-500 MG TABS tablet Take 2 tablets by mouth at bedtime as needed (for pain.).    Marland Kitchen ezetimibe (ZETIA) 10 MG tablet TAKE 1 TABLET BY MOUTH  DAILY 90 tablet 3  . metFORMIN (GLUCOPHAGE) 500 MG tablet Take 1 tablet (500 mg total) by mouth daily with breakfast. 90 tablet 0  . metoprolol succinate (TOPROL-XL) 25 MG 24 hr tablet TAKE 1 TABLET BY MOUTH  TWICE DAILY 180 tablet 3  . omeprazole (PRILOSEC) 40 MG capsule Take 1 capsule (40 mg total) by mouth daily. 90 capsule 3  . ramipril (ALTACE) 10 MG capsule TAKE 1 CAPSULE BY MOUTH  DAILY 90 capsule 3  . rosuvastatin (CRESTOR) 40 MG tablet TAKE 1 TABLET BY MOUTH  DAILY 90 tablet 3  . temazepam (RESTORIL) 15 MG capsule TAKE 1 CAPSULE BY MOUTH  EVERY NIGHT AT BEDTIME AS  NEEDED FOR SLEEP 90 capsule 1  . triamcinolone (NASACORT) 55 MCG/ACT nasal inhaler Place 2 sprays into the nose daily. (Patient not taking: Reported on 12/14/2020) 3 Inhaler 3   No current facility-administered medications for this visit.    LABS/IMAGING: No results found for this or any previous visit (from the past 48 hour(s)). No results found.  WEIGHTS: Wt Readings from Last 3 Encounters:  12/14/20 182 lb (82.6 kg)  12/06/20 178 lb (80.7 kg)  11/15/20 178  lb (80.7 kg)    VITALS: BP 98/68 (BP Location: Left Arm, Patient Position: Sitting)   Pulse 70   Ht 5' 4.5" (1.638 m)   Wt 182 lb (82.6 kg)   LMP  (LMP Unknown)   SpO2 96%   BMI 30.76 kg/m   EXAM: General appearance: alert and no distress Neck: no carotid bruit and no JVD Lungs: clear to auscultation bilaterally Heart: regular rate and rhythm, S1, S2 normal, no murmur, click, rub or gallop Abdomen: soft, non-tender; bowel sounds normal; no masses,  no organomegaly Extremities: extremities normal, atraumatic, no cyanosis or edema Pulses: 2+ and symmetric Skin:  Skin color, texture, turgor normal. No rashes or lesions Neurologic: Grossly normal Psych: Pleasant  EKG: Normal sinus rhythm at 70-personally reviewed  ASSESSMENT: 1. Coronary artery disease status post PCI with a drug-eluting stent to the mid/distal RCA (02/2016) 2. AVNRT - s/p catheter ablation in 04/2016 3. S/p NSTEMI with abnormal nuclear stress test 4. Dyslipidemia 5. Hypertension 6. Mildly elevated ALT - hepatic steatosis  PLAN: 1.   Natalie Hall has very well-controlled lipids.  She denies any chest pain or worsening shortness of breath.  She has had no recurrent arrhythmias after catheter ablation.  Her ALT was mildly elevated recently.  She also had imaging which showed some worsening hepatic steatosis.  I suspect this is the rationale.  She was concerned about risk of malignancy given a prior remote melanoma.  However, ultrasound in June 2021 showed no liver masses or evidence of metastatic disease.  I would recommend a repeat liver enzymes in 3 months as she is on statin and ezetimibe.  As long as this remains below 3 times upper limit of normal I would continue it.  Follow-up with me annually or sooner as necessary.  Pixie Casino, MD, [facility], [place] Director of the Advanced Lipid Disorders &  Cardiovascular Risk Reduction Clinic Diplomate of the American Board of Clinical Lipidology Attending Cardiologist  Direct Dial: [phone]  Fax: [phone]  Website:  [email or web address] 12/14/2020, 9:16 AM

## 2020-12-26 ENCOUNTER — Other Ambulatory Visit: Payer: Self-pay

## 2020-12-26 ENCOUNTER — Encounter (INDEPENDENT_AMBULATORY_CARE_PROVIDER_SITE_OTHER): Payer: Self-pay | Admitting: Family Medicine

## 2020-12-26 ENCOUNTER — Ambulatory Visit (INDEPENDENT_AMBULATORY_CARE_PROVIDER_SITE_OTHER): Payer: Medicare Other | Admitting: Family Medicine

## 2020-12-26 VITALS — BP 126/74 | HR 68 | Temp 98.0°F | Ht 64.0 in | Wt 177.0 lb

## 2020-12-26 DIAGNOSIS — Z6830 Body mass index (BMI) 30.0-30.9, adult: Secondary | ICD-10-CM | POA: Diagnosis not present

## 2020-12-26 DIAGNOSIS — E559 Vitamin D deficiency, unspecified: Secondary | ICD-10-CM | POA: Diagnosis not present

## 2020-12-26 DIAGNOSIS — E669 Obesity, unspecified: Secondary | ICD-10-CM

## 2020-12-26 MED ORDER — VITAMIN D 50 MCG (2000 UT) PO CAPS: 1.0000 | ORAL_CAPSULE | Freq: Every day | ORAL | 0 refills | Status: DC

## 2020-12-27 ENCOUNTER — Encounter: Payer: Self-pay | Admitting: Family Medicine

## 2020-12-27 ENCOUNTER — Encounter (INDEPENDENT_AMBULATORY_CARE_PROVIDER_SITE_OTHER): Payer: Self-pay | Admitting: Family Medicine

## 2020-12-27 NOTE — Progress Notes (Signed)
Chief Complaint:   OBESITY Natalie Hall is here to discuss her progress with her obesity treatment plan along with follow-up of her obesity related diagnoses. Natalie Hall is on the Category 2 Plan and states she is following her eating plan approximately 80% of the time. Natalie Hall states she is doing Chief Executive Officer for 60 minutes 3 times per week.  Today's visit was #: 71 Starting weight: 213 lbs Starting date: 08/04/2018 Today's weight: 177 lbs Today's date: 12/26/2020 Total lbs lost to date: 36 lbs Total lbs lost since last in-office visit: 1 lb  Interim History: Natalie Hall notes that she feels well overall.  We stopped Ozempic due to GI issues and she denies excessive appetite. She says that she has never kept weight off for this long.  She has been exercising consistently and working in the yard.  Extra snack calories are <200/day.    Subjective:   1. Vitamin D deficiency Vitamin D at goal (51.0).  Down from 105 this past June when it was over-replaced. She is not currently on supplementation.  Assessment/Plan:   1. Vitamin D deficiency Discussed labs with patient today.  She will start OTC vitamin D 2,000 IU daily, as per below.  - Start Cholecalciferol (VITAMIN D) 50 MCG (2000 UT) CAPS; Take 1 capsule (2,000 Units total) by mouth daily.  Dispense: 30 capsule; Refill: 0  2. Class 1 obesity with serious comorbidity and body mass index (BMI) of 30.0 to 30.9 in adult, unspecified obesity type  Natalie Hall is currently in the action stage of change. As such, her goal is to continue with weight loss efforts. She has agreed to the Category 2 Plan.   Exercise goals: As is.  Behavioral modification strategies: planning for success.  Natalie Hall has agreed to follow-up with our clinic in 3 weeks.   Objective:   Blood pressure 126/74, pulse 68, temperature 98 F (36.7 C), height 5\' 4"  (1.626 m), weight 177 lb (80.3 kg), SpO2 99 %. Body mass index is 30.38 kg/m.  General: Cooperative,  alert, well developed, in no acute distress. HEENT: Conjunctivae and lids unremarkable. Cardiovascular: Regular rhythm.  Lungs: Normal work of breathing. Neurologic: No focal deficits.   Lab Results  Component Value Date   CREATININE 0.59 12/06/2020   BUN 15 12/06/2020   NA 138 12/06/2020   K 4.7 12/06/2020   CL 100 12/06/2020   CO2 22 12/06/2020   Lab Results  Component Value Date   ALT 37 (H) 12/06/2020   AST 32 12/06/2020   ALKPHOS 50 12/06/2020   BILITOT 0.3 12/06/2020   Lab Results  Component Value Date   HGBA1C 5.5 12/06/2020   HGBA1C 6.0 03/31/2020   HGBA1C 5.6 11/17/2019   HGBA1C 6.0 03/31/2019   HGBA1C 5.7 (H) 11/20/2018   Lab Results  Component Value Date   INSULIN 16.6 12/06/2020   INSULIN 18.5 11/17/2019   INSULIN 15.4 11/20/2018   INSULIN 33.5 (H) 08/04/2018   Lab Results  Component Value Date   TSH 1.57 03/31/2020   Lab Results  Component Value Date   CHOL 106 12/06/2020   HDL 28 (L) 12/06/2020   LDLCALC 53 12/06/2020   LDLDIRECT 96.0 03/11/2018   TRIG 141 12/06/2020   CHOLHDL 4 03/31/2020   Lab Results  Component Value Date   WBC 5.3 03/31/2020   HGB 13.0 03/31/2020   HCT 38.4 03/31/2020   MCV 86.4 03/31/2020   PLT 170.0 03/31/2020   Obesity Behavioral Intervention:   Approximately 15 minutes were spent  on the discussion below.  ASK: We discussed the diagnosis of obesity with Natalie Hall today and Natalie Hall agreed to give [name] permission to discuss obesity behavioral modification therapy today.  ASSESS: Natalie Hall has the diagnosis of obesity and her BMI today is 30.5. Natalie Hall is in the action stage of change.   ADVISE: Natalie Hall was educated on the multiple health risks of obesity as well as the benefit of weight loss to improve her health. She was advised of the need for long term treatment and the importance of lifestyle modifications to improve her current health and to decrease her risk of future health problems.  AGREE: Multiple dietary  modification options and treatment options were discussed and Natalie Hall agreed to follow the recommendations documented in the above note.  ARRANGE: Natalie Hall was educated on the importance of frequent visits to treat obesity as outlined per CMS and USPSTF guidelines and agreed to schedule her next follow up appointment today.  Attestation Statements:   Reviewed by clinician on day of visit: allergies, medications, problem list, medical history, surgical history, family history, social history, and previous encounter notes.  I, Water quality scientist, CMA, am acting as Location manager for [employer], [place].  I have reviewed the above documentation for accuracy and completeness, and I agree with the above. -  Georgianne Fick, FNP

## 2020-12-28 NOTE — Telephone Encounter (Signed)
The order is ready to fax  

## 2020-12-31 ENCOUNTER — Encounter (INDEPENDENT_AMBULATORY_CARE_PROVIDER_SITE_OTHER): Payer: Self-pay | Admitting: Family Medicine

## 2021-01-02 ENCOUNTER — Other Ambulatory Visit (INDEPENDENT_AMBULATORY_CARE_PROVIDER_SITE_OTHER): Payer: Self-pay | Admitting: Family Medicine

## 2021-01-02 DIAGNOSIS — R7303 Prediabetes: Secondary | ICD-10-CM

## 2021-01-02 NOTE — Telephone Encounter (Signed)
Last seen by Dawn Whitmire, FNP 

## 2021-01-02 NOTE — Telephone Encounter (Signed)
Last OV with Dawn 

## 2021-01-06 ENCOUNTER — Encounter (INDEPENDENT_AMBULATORY_CARE_PROVIDER_SITE_OTHER): Payer: Self-pay | Admitting: Family Medicine

## 2021-01-06 ENCOUNTER — Other Ambulatory Visit (INDEPENDENT_AMBULATORY_CARE_PROVIDER_SITE_OTHER): Payer: Self-pay | Admitting: Family Medicine

## 2021-01-06 DIAGNOSIS — R7303 Prediabetes: Secondary | ICD-10-CM

## 2021-01-09 MED ORDER — METFORMIN HCL 500 MG PO TABS: 500.0000 mg | ORAL_TABLET | Freq: Every day | ORAL | 0 refills | Status: DC

## 2021-01-09 NOTE — Telephone Encounter (Signed)
Natalie Hall 

## 2021-01-16 ENCOUNTER — Other Ambulatory Visit: Payer: Self-pay | Admitting: Family Medicine

## 2021-01-17 ENCOUNTER — Encounter (INDEPENDENT_AMBULATORY_CARE_PROVIDER_SITE_OTHER): Payer: Self-pay | Admitting: Family Medicine

## 2021-01-17 ENCOUNTER — Other Ambulatory Visit: Payer: Self-pay

## 2021-01-17 ENCOUNTER — Ambulatory Visit (INDEPENDENT_AMBULATORY_CARE_PROVIDER_SITE_OTHER): Payer: Medicare Other | Admitting: Family Medicine

## 2021-01-17 VITALS — BP 124/69 | HR 65 | Temp 98.0°F | Ht 64.0 in | Wt 180.0 lb

## 2021-01-17 DIAGNOSIS — Z6841 Body Mass Index (BMI) 40.0 and over, adult: Secondary | ICD-10-CM | POA: Diagnosis not present

## 2021-01-17 DIAGNOSIS — R7303 Prediabetes: Secondary | ICD-10-CM

## 2021-01-18 ENCOUNTER — Encounter (INDEPENDENT_AMBULATORY_CARE_PROVIDER_SITE_OTHER): Payer: Self-pay | Admitting: Family Medicine

## 2021-01-18 NOTE — Progress Notes (Signed)
Chief Complaint:   OBESITY Natalie Hall is here to discuss her progress with her obesity treatment plan along with follow-up of her obesity related diagnoses. Natalie Hall is on the Category 2 Plan and states she is following her eating plan approximately 50% of the time. Natalie Hall states she is doing Chief of Staff for 60 minutes 3 times per week.  Today's visit was #: 1 Starting weight: 213 lbs Starting date: 08/04/2018 Today's weight: 180 lbs Today's date: 01/17/2021 Total lbs lost to date: 33 Total lbs lost since last in-office visit: +3  Interim History: Natalie Hall says she has had some issues with sweets due to her husband's birthday. They also ate fried fish for his birthday. She feels her eating must have been "off" since she has gained 3 lbs since last OV.Marland Kitchen She has been consistent with her exercise. She is frustrated because her husband is no longer eating beef and Natalie Hall is cooking a separate meal for him. She also has a lot of beef in her freezer.  Subjective:   1. Pre-diabetes Natalie Hall takes metformin 500 mg QAM. Last A1c was 5.5. She had been on Ozempic but we had to discontinue secondary to GI issues. She does not some hunger in late afternoon.   Lab Results  Component Value Date   HGBA1C 5.5 12/06/2020   Lab Results  Component Value Date   INSULIN 16.6 12/06/2020   INSULIN 18.5 11/17/2019   INSULIN 15.4 11/20/2018   INSULIN 33.5 (H) 08/04/2018   Assessment/Plan:   1. Pre-diabetes Natalie Hall will move her metformin to lunch time.  2. Obesity: BMI 30 Natalie Hall is currently in the action stage of change. As such, her goal is to continue with weight loss efforts. She has agreed to the Category 2 Plan.   Exercise goals: As is, and add walking on days she does not go to [employer].  Behavioral modification strategies: decreasing simple carbohydrates.  Natalie Hall has agreed to follow-up with our clinic in 4 weeks.   Objective:   Blood pressure 124/69, pulse 65, temperature 98 F  (36.7 C), height 5\' 4"  (1.626 m), weight 180 lb (81.6 kg), SpO2 97 %. Body mass index is 30.9 kg/m.  General: Cooperative, alert, well developed, in no acute distress. HEENT: Conjunctivae and lids unremarkable. Cardiovascular: Regular rhythm.  Lungs: Normal work of breathing. Neurologic: No focal deficits.   Lab Results  Component Value Date   CREATININE 0.59 12/06/2020   BUN 15 12/06/2020   NA 138 12/06/2020   K 4.7 12/06/2020   CL 100 12/06/2020   CO2 22 12/06/2020   Lab Results  Component Value Date   ALT 37 (H) 12/06/2020   AST 32 12/06/2020   ALKPHOS 50 12/06/2020   BILITOT 0.3 12/06/2020   Lab Results  Component Value Date   HGBA1C 5.5 12/06/2020   HGBA1C 6.0 03/31/2020   HGBA1C 5.6 11/17/2019   HGBA1C 6.0 03/31/2019   HGBA1C 5.7 (H) 11/20/2018   Lab Results  Component Value Date   INSULIN 16.6 12/06/2020   INSULIN 18.5 11/17/2019   INSULIN 15.4 11/20/2018   INSULIN 33.5 (H) 08/04/2018   Lab Results  Component Value Date   TSH 1.57 03/31/2020   Lab Results  Component Value Date   CHOL 106 12/06/2020   HDL 28 (L) 12/06/2020   LDLCALC 53 12/06/2020   LDLDIRECT 96.0 03/11/2018   TRIG 141 12/06/2020   CHOLHDL 4 03/31/2020   Lab Results  Component Value Date   WBC 5.3 03/31/2020  HGB 13.0 03/31/2020   HCT 38.4 03/31/2020   MCV 86.4 03/31/2020   PLT 170.0 03/31/2020   No results found for: IRON, TIBC, FERRITIN  Obesity Behavioral Intervention:   Approximately 15 minutes were spent on the discussion below.  ASK: We discussed the diagnosis of obesity with Natalie Hall today and Natalie Hall agreed to give [name] permission to discuss obesity behavioral modification therapy today.  ASSESS: Natalie Hall has the diagnosis of obesity and her BMI today is 30.88. Natalie Hall is in the action stage of change.   ADVISE: Natalie Hall was educated on the multiple health risks of obesity as well as the benefit of weight loss to improve her health. She was advised of the need for long  term treatment and the importance of lifestyle modifications to improve her current health and to decrease her risk of future health problems.  AGREE: Multiple dietary modification options and treatment options were discussed and Natalie Hall agreed to follow the recommendations documented in the above note.  ARRANGE: Natalie Hall was educated on the importance of frequent visits to treat obesity as outlined per CMS and USPSTF guidelines and agreed to schedule her next follow up appointment today.  Attestation Statements:   Reviewed by clinician on day of visit: allergies, medications, problem list, medical history, surgical history, family history, social history, and previous encounter notes.   [name], am acting as Location manager for [employer], FNP-C.  I have reviewed the above documentation for accuracy and completeness, and I agree with the above. -  Georgianne Fick, FNP

## 2021-02-06 ENCOUNTER — Other Ambulatory Visit: Payer: Self-pay | Admitting: Family Medicine

## 2021-02-07 NOTE — Telephone Encounter (Signed)
Last refill- 08/23/2020-90 capsules 1 refill Last office visit- 03/31/2020   Next appointment-04/04/2021

## 2021-02-10 LAB — HM MAMMOGRAPHY

## 2021-02-10 LAB — HM DEXA SCAN

## 2021-02-14 ENCOUNTER — Ambulatory Visit (INDEPENDENT_AMBULATORY_CARE_PROVIDER_SITE_OTHER): Payer: Medicare Other | Admitting: Family Medicine

## 2021-02-14 DIAGNOSIS — Z6841 Body Mass Index (BMI) 40.0 and over, adult: Secondary | ICD-10-CM

## 2021-02-17 ENCOUNTER — Encounter: Payer: Self-pay | Admitting: Family Medicine

## 2021-02-21 ENCOUNTER — Encounter: Payer: Self-pay | Admitting: Family Medicine

## 2021-03-04 ENCOUNTER — Other Ambulatory Visit (INDEPENDENT_AMBULATORY_CARE_PROVIDER_SITE_OTHER): Payer: Self-pay | Admitting: Family Medicine

## 2021-03-04 DIAGNOSIS — R7303 Prediabetes: Secondary | ICD-10-CM

## 2021-03-06 NOTE — Telephone Encounter (Signed)
Pt last seen by Dawn Whitmire, FNP.  

## 2021-03-07 ENCOUNTER — Encounter (INDEPENDENT_AMBULATORY_CARE_PROVIDER_SITE_OTHER): Payer: Self-pay | Admitting: Family Medicine

## 2021-03-07 ENCOUNTER — Ambulatory Visit (INDEPENDENT_AMBULATORY_CARE_PROVIDER_SITE_OTHER): Payer: Medicare Other | Admitting: Family Medicine

## 2021-03-07 ENCOUNTER — Other Ambulatory Visit: Payer: Self-pay

## 2021-03-07 VITALS — BP 124/72 | HR 61 | Temp 98.7°F | Ht 64.0 in | Wt 180.0 lb

## 2021-03-07 DIAGNOSIS — Z6841 Body Mass Index (BMI) 40.0 and over, adult: Secondary | ICD-10-CM

## 2021-03-07 DIAGNOSIS — R7303 Prediabetes: Secondary | ICD-10-CM

## 2021-03-08 ENCOUNTER — Encounter (INDEPENDENT_AMBULATORY_CARE_PROVIDER_SITE_OTHER): Payer: Self-pay | Admitting: Family Medicine

## 2021-03-08 NOTE — Progress Notes (Signed)
Chief Complaint:   OBESITY Natalie Hall is here to discuss her progress with her obesity treatment plan along with follow-up of her obesity related diagnoses. Natalie Hall is on the Category 2 Plan and states she is following her eating plan approximately 50% of the time. Natalie Hall states she just started doing silver sneakers for 60 minutes 2 times per week.  Today's visit was #: 36 Starting weight: 213 lbs Starting date: 08/04/2018 Today's weight: 180 lbs Today's date: 03/07/2021 Total lbs lost to date: 33 Total lbs lost since last in-office visit: 0  Interim History: Natalie Hall has had skin cancer surgery, and COVID since her last office visit. She has maintained her weight today. She would like to lose more weight, but we discussed 30 BMI is ok also. Her weight has been plateaued for over a year. She has incorporated exercise over the past several months.  Subjective:   1. Pre-diabetes Natalie Hall is on metformin. Last A1c was 5.5.   Lab Results  Component Value Date   HGBA1C 5.5 12/06/2020   Lab Results  Component Value Date   INSULIN 16.6 12/06/2020   INSULIN 18.5 11/17/2019   INSULIN 15.4 11/20/2018   INSULIN 33.5 (H) 08/04/2018   Assessment/Plan:   1. Pre-diabetes Natalie Hall will continue metformin.  2. Obesity: BMI 30.88 Natalie Hall is currently in the action stage of change. As such, her goal is to continue with weight loss efforts. She has agreed to the Category 2 Plan.   Handout: Smart Fruit. She may have 1.5 cups of melon.  Exercise goals: As is.  Behavioral modification strategies: decreasing simple carbohydrates and planning for success.  Natalie Hall has agreed to follow-up with our clinic in 5 weeks.   Objective:   Blood pressure 124/72, pulse 61, temperature 98.7 F (37.1 C), height 5\' 4"  (1.626 m), weight 180 lb (81.6 kg), SpO2 100 %. Body mass index is 30.9 kg/m.  General: Cooperative, alert, well developed, in no acute distress. HEENT: Conjunctivae and lids  unremarkable. Cardiovascular: Regular rhythm.  Lungs: Normal work of breathing. Neurologic: No focal deficits.   Lab Results  Component Value Date   CREATININE 0.59 12/06/2020   BUN 15 12/06/2020   NA 138 12/06/2020   K 4.7 12/06/2020   CL 100 12/06/2020   CO2 22 12/06/2020   Lab Results  Component Value Date   ALT 37 (H) 12/06/2020   AST 32 12/06/2020   ALKPHOS 50 12/06/2020   BILITOT 0.3 12/06/2020   Lab Results  Component Value Date   HGBA1C 5.5 12/06/2020   HGBA1C 6.0 03/31/2020   HGBA1C 5.6 11/17/2019   HGBA1C 6.0 03/31/2019   HGBA1C 5.7 (H) 11/20/2018   Lab Results  Component Value Date   INSULIN 16.6 12/06/2020   INSULIN 18.5 11/17/2019   INSULIN 15.4 11/20/2018   INSULIN 33.5 (H) 08/04/2018   Lab Results  Component Value Date   TSH 1.57 03/31/2020   Lab Results  Component Value Date   CHOL 106 12/06/2020   HDL 28 (L) 12/06/2020   LDLCALC 53 12/06/2020   LDLDIRECT 96.0 03/11/2018   TRIG 141 12/06/2020   CHOLHDL 4 03/31/2020   Lab Results  Component Value Date   WBC 5.3 03/31/2020   HGB 13.0 03/31/2020   HCT 38.4 03/31/2020   MCV 86.4 03/31/2020   PLT 170.0 03/31/2020   No results found for: IRON, TIBC, FERRITIN  Obesity Behavioral Intervention:   Approximately 15 minutes were spent on the discussion below.  ASK: We discussed the diagnosis  of obesity with Natalie Hall today and Natalie Hall agreed to give [name] permission to discuss obesity behavioral modification therapy today.  ASSESS: Nyeisha has the diagnosis of obesity and her BMI today is 30.88. Natalie Hall is in the action stage of change.   ADVISE: Natalie Hall was educated on the multiple health risks of obesity as well as the benefit of weight loss to improve her health. She was advised of the need for long term treatment and the importance of lifestyle modifications to improve her current health and to decrease her risk of future health problems.  AGREE: Multiple dietary modification options and  treatment options were discussed and Wednesday agreed to follow the recommendations documented in the above note.  ARRANGE: Natalie Hall was educated on the importance of frequent visits to treat obesity as outlined per CMS and USPSTF guidelines and agreed to schedule her next follow up appointment today.  Attestation Statements:   Reviewed by clinician on day of visit: allergies, medications, problem list, medical history, surgical history, family history, social history, and previous encounter notes.   Natalie Hall, am acting as Location manager for [employer], FNP-C.  I have reviewed the above documentation for accuracy and completeness, and I agree with the above. -  Georgianne Fick, FNP

## 2021-03-09 NOTE — Progress Notes (Signed)
Opened in error

## 2021-03-30 LAB — HEPATIC FUNCTION PANEL
ALT: 25 IU/L (ref 0–32)
AST: 25 IU/L (ref 0–40)
Albumin: 4.2 g/dL (ref 3.8–4.8)
Alkaline Phosphatase: 43 IU/L — ABNORMAL LOW (ref 44–121)
Bilirubin Total: 0.4 mg/dL (ref 0.0–1.2)
Bilirubin, Direct: 0.14 mg/dL (ref 0.00–0.40)
Total Protein: 7.4 g/dL (ref 6.0–8.5)

## 2021-03-31 ENCOUNTER — Other Ambulatory Visit (INDEPENDENT_AMBULATORY_CARE_PROVIDER_SITE_OTHER): Payer: Self-pay | Admitting: Family Medicine

## 2021-03-31 DIAGNOSIS — R7303 Prediabetes: Secondary | ICD-10-CM

## 2021-04-03 NOTE — Telephone Encounter (Signed)
Last seen Dawn 

## 2021-04-03 NOTE — Telephone Encounter (Signed)
Mychart message sent.

## 2021-04-04 ENCOUNTER — Encounter: Payer: Medicare Other | Admitting: Family Medicine

## 2021-04-06 ENCOUNTER — Other Ambulatory Visit (INDEPENDENT_AMBULATORY_CARE_PROVIDER_SITE_OTHER): Payer: Self-pay | Admitting: Family Medicine

## 2021-04-06 DIAGNOSIS — R7303 Prediabetes: Secondary | ICD-10-CM

## 2021-04-11 ENCOUNTER — Ambulatory Visit (INDEPENDENT_AMBULATORY_CARE_PROVIDER_SITE_OTHER): Payer: Medicare Other | Admitting: Family Medicine

## 2021-04-11 ENCOUNTER — Encounter (INDEPENDENT_AMBULATORY_CARE_PROVIDER_SITE_OTHER): Payer: Self-pay | Admitting: Family Medicine

## 2021-04-11 ENCOUNTER — Other Ambulatory Visit: Payer: Self-pay

## 2021-04-11 VITALS — BP 110/67 | HR 66 | Temp 97.6°F | Ht 64.0 in | Wt 182.0 lb

## 2021-04-11 DIAGNOSIS — E559 Vitamin D deficiency, unspecified: Secondary | ICD-10-CM | POA: Diagnosis not present

## 2021-04-11 DIAGNOSIS — R7303 Prediabetes: Secondary | ICD-10-CM | POA: Diagnosis not present

## 2021-04-11 DIAGNOSIS — Z6841 Body Mass Index (BMI) 40.0 and over, adult: Secondary | ICD-10-CM

## 2021-04-11 DIAGNOSIS — E66813 Obesity, class 3: Secondary | ICD-10-CM

## 2021-04-11 MED ORDER — METFORMIN HCL 500 MG PO TABS: 500.0000 mg | ORAL_TABLET | Freq: Every day | ORAL | 0 refills | Status: DC

## 2021-04-12 ENCOUNTER — Other Ambulatory Visit: Payer: Self-pay

## 2021-04-12 NOTE — Progress Notes (Signed)
Chief Complaint:   OBESITY Natalie Hall is here to discuss her progress with her obesity treatment plan along with follow-up of her obesity related diagnoses. Natalie Hall is on the Category 2 Plan and states she is following her eating plan approximately 80% of the time. Natalie Hall states she is doing Chief of Staff for 60 minutes 3 times per week.  Today's visit was #: 61 Starting weight: 213 lbs Starting date: 08/04/2018 Today's weight: 182 lbs Today's date: 04/11/2021 Total lbs lost to date: 31 lbs Total lbs lost since last in-office visit: +2 lbs  Interim History: [name] recently returned from a trip to [place] and she was off plan.  She gained about 7 pounds on her trip, but has lost 5 pounds.  She is now back on plan and back to her exercise routine.  Subjective:   1. Prediabetes Last A1c was 5.5 on metformin 500 mg daily.  She did not tolerate Ozempic due to side effects.  Lab Results  Component Value Date   HGBA1C 5.5 12/06/2020   Lab Results  Component Value Date   INSULIN 16.6 12/06/2020   INSULIN 18.5 11/17/2019   INSULIN 15.4 11/20/2018   INSULIN 33.5 (H) 08/04/2018   2. Vitamin D deficiency Vitamin D at goal.  On OTC vitamin D (2,000 IU).  Assessment/Plan:   1. Prediabetes Will refill metformin 500 mg daily today, as per below.  - Refill metFORMIN (GLUCOPHAGE) 500 MG tablet; Take 1 tablet (500 mg total) by mouth daily with breakfast.  Dispense: 90 tablet; Refill: 0  2. Vitamin D deficiency Continue OTC vitamin D.  3. Obesity: BMI 31.22  Natalie Hall is currently in the action stage of change. As such, her goal is to continue with weight loss efforts. She has agreed to the Category 2 Plan.   Exercise goals:  As is.  Behavioral modification strategies: decreasing simple carbohydrates.  Natalie Hall has agreed to follow-up with our clinic in 3 weeks.  Objective:   Blood pressure 110/67, pulse 66, temperature 97.6 F (36.4 C), height 5\' 4"  (1.626 m), weight 182 lb (82.6 kg),  SpO2 97 %. Body mass index is 31.24 kg/m.  General: Cooperative, alert, well developed, in no acute distress. HEENT: Conjunctivae and lids unremarkable. Cardiovascular: Regular rhythm.  Lungs: Normal work of breathing. Neurologic: No focal deficits.   Lab Results  Component Value Date   CREATININE 0.59 12/06/2020   BUN 15 12/06/2020   NA 138 12/06/2020   K 4.7 12/06/2020   CL 100 12/06/2020   CO2 22 12/06/2020   Lab Results  Component Value Date   ALT 25 03/30/2021   AST 25 03/30/2021   ALKPHOS 43 (L) 03/30/2021   BILITOT 0.4 03/30/2021   Lab Results  Component Value Date   HGBA1C 5.5 12/06/2020   HGBA1C 6.0 03/31/2020   HGBA1C 5.6 11/17/2019   HGBA1C 6.0 03/31/2019   HGBA1C 5.7 (H) 11/20/2018   Lab Results  Component Value Date   INSULIN 16.6 12/06/2020   INSULIN 18.5 11/17/2019   INSULIN 15.4 11/20/2018   INSULIN 33.5 (H) 08/04/2018   Lab Results  Component Value Date   TSH 1.57 03/31/2020   Lab Results  Component Value Date   CHOL 106 12/06/2020   HDL 28 (L) 12/06/2020   LDLCALC 53 12/06/2020   LDLDIRECT 96.0 03/11/2018   TRIG 141 12/06/2020   CHOLHDL 4 03/31/2020   Lab Results  Component Value Date   WBC 5.3 03/31/2020   HGB 13.0 03/31/2020   HCT 38.4  03/31/2020   MCV 86.4 03/31/2020   PLT 170.0 03/31/2020   Obesity Behavioral Intervention:   Approximately 15 minutes were spent on the discussion below.  ASK: We discussed the diagnosis of obesity with Tomi Hall today and Natalie Hall agreed to give [name] permission to discuss obesity behavioral modification therapy today.  ASSESS: Natalie Hall has the diagnosis of obesity and her BMI today is 31.3. Natalie Hall is in the action stage of change.   ADVISE: Natalie Hall was educated on the multiple health risks of obesity as well as the benefit of weight loss to improve her health. She was advised of the need for long term treatment and the importance of lifestyle modifications to improve her current health and to decrease  her risk of future health problems.  AGREE: Multiple dietary modification options and treatment options were discussed and Natalie Hall agreed to follow the recommendations documented in the above note.  ARRANGE: Natalie Hall was educated on the importance of frequent visits to treat obesity as outlined per CMS and USPSTF guidelines and agreed to schedule her next follow up appointment today.  Attestation Statements:   Reviewed by clinician on day of visit: allergies, medications, problem list, medical history, surgical history, family history, social history, and previous encounter notes.  I, Water quality scientist, CMA, am acting as Location manager for [employer], Natalie Hall.  I have reviewed the above documentation for accuracy and completeness, and I agree with the above. -  Georgianne Fick, FNP

## 2021-04-13 ENCOUNTER — Encounter: Payer: Self-pay | Admitting: Family Medicine

## 2021-04-13 ENCOUNTER — Ambulatory Visit (INDEPENDENT_AMBULATORY_CARE_PROVIDER_SITE_OTHER)
Admission: RE | Admit: 2021-04-13 | Discharge: 2021-04-13 | Disposition: A | Discharge status: Home / Self Care | Payer: Medicare Other | Source: Ambulatory Visit | Attending: Family Medicine | Admitting: Family Medicine

## 2021-04-13 ENCOUNTER — Ambulatory Visit (INDEPENDENT_AMBULATORY_CARE_PROVIDER_SITE_OTHER): Payer: Medicare Other | Admitting: Family Medicine

## 2021-04-13 VITALS — BP 126/70 | HR 62 | Temp 98.5°F | Ht 64.0 in | Wt 186.0 lb

## 2021-04-13 DIAGNOSIS — J45909 Unspecified asthma, uncomplicated: Secondary | ICD-10-CM | POA: Diagnosis not present

## 2021-04-13 DIAGNOSIS — I2510 Atherosclerotic heart disease of native coronary artery without angina pectoris: Secondary | ICD-10-CM | POA: Diagnosis not present

## 2021-04-13 DIAGNOSIS — E559 Vitamin D deficiency, unspecified: Secondary | ICD-10-CM

## 2021-04-13 DIAGNOSIS — M8949 Other hypertrophic osteoarthropathy, multiple sites: Secondary | ICD-10-CM

## 2021-04-13 DIAGNOSIS — E785 Hyperlipidemia, unspecified: Secondary | ICD-10-CM

## 2021-04-13 DIAGNOSIS — C6992 Malignant neoplasm of unspecified site of left eye: Secondary | ICD-10-CM

## 2021-04-13 DIAGNOSIS — Z6841 Body Mass Index (BMI) 40.0 and over, adult: Secondary | ICD-10-CM

## 2021-04-13 DIAGNOSIS — I10 Essential (primary) hypertension: Secondary | ICD-10-CM

## 2021-04-13 DIAGNOSIS — F3289 Other specified depressive episodes: Secondary | ICD-10-CM

## 2021-04-13 DIAGNOSIS — R7303 Prediabetes: Secondary | ICD-10-CM | POA: Diagnosis not present

## 2021-04-13 DIAGNOSIS — K219 Gastro-esophageal reflux disease without esophagitis: Secondary | ICD-10-CM

## 2021-04-13 DIAGNOSIS — M159 Polyosteoarthritis, unspecified: Secondary | ICD-10-CM

## 2021-04-13 LAB — LIPID PANEL
Cholesterol: 111 mg/dL (ref 0–200)
HDL: 28.8 mg/dL — ABNORMAL LOW (ref >39.00)
LDL Cholesterol: 50 mg/dL (ref 0–99)
NonHDL: 82.48
Total CHOL/HDL Ratio: 4
Triglycerides: 164 mg/dL — ABNORMAL HIGH (ref 0.0–149.0)
VLDL: 32.8 mg/dL (ref 0.0–40.0)

## 2021-04-13 LAB — BASIC METABOLIC PANEL
BUN: 20 mg/dL (ref 6–23)
CO2: 28 mEq/L (ref 19–32)
Calcium: 9.6 mg/dL (ref 8.4–10.5)
Chloride: 103 mEq/L (ref 96–112)
Creatinine, Ser: 0.63 mg/dL (ref 0.40–1.20)
GFR: 89.56 mL/min (ref >60.00)
Glucose, Bld: 85 mg/dL (ref 70–99)
Potassium: 4.6 mEq/L (ref 3.5–5.1)
Sodium: 138 mEq/L (ref 135–145)

## 2021-04-13 LAB — CBC WITH DIFFERENTIAL/PLATELET
Basophils Absolute: 0 10*3/uL (ref 0.0–0.1)
Basophils Relative: 0.5 % (ref 0.0–3.0)
Eosinophils Absolute: 0.2 10*3/uL (ref 0.0–0.7)
Eosinophils Relative: 4.5 % (ref 0.0–5.0)
HCT: 38.5 % (ref 36.0–46.0)
Hemoglobin: 13 g/dL (ref 12.0–15.0)
Lymphocytes Relative: 31.6 % (ref 12.0–46.0)
Lymphs Abs: 1.5 10*3/uL (ref 0.7–4.0)
MCHC: 33.8 g/dL (ref 30.0–36.0)
MCV: 87.4 fl (ref 78.0–100.0)
Monocytes Absolute: 0.5 10*3/uL (ref 0.1–1.0)
Monocytes Relative: 9.4 % (ref 3.0–12.0)
Neutro Abs: 2.7 10*3/uL (ref 1.4–7.7)
Neutrophils Relative %: 54 % (ref 43.0–77.0)
Platelets: 186 10*3/uL (ref 150.0–400.0)
RBC: 4.4 Mil/uL (ref 3.87–5.11)
RDW: 13.8 % (ref 11.5–15.5)
WBC: 4.9 10*3/uL (ref 4.0–10.5)

## 2021-04-13 LAB — T3, FREE: T3, Free: 3.4 pg/mL (ref 2.3–4.2)

## 2021-04-13 LAB — HEPATIC FUNCTION PANEL
ALT: 22 U/L (ref 0–35)
AST: 24 U/L (ref 0–37)
Albumin: 4.2 g/dL (ref 3.5–5.2)
Alkaline Phosphatase: 36 U/L — ABNORMAL LOW (ref 39–117)
Bilirubin, Direct: 0.1 mg/dL (ref 0.0–0.3)
Total Bilirubin: 0.5 mg/dL (ref 0.2–1.2)
Total Protein: 7.5 g/dL (ref 6.0–8.3)

## 2021-04-13 LAB — HEMOGLOBIN A1C: Hgb A1c MFr Bld: 5.9 % (ref 4.6–6.5)

## 2021-04-13 LAB — T4, FREE: Free T4: 0.79 ng/dL (ref 0.60–1.60)

## 2021-04-13 LAB — TSH: TSH: 1.31 u[IU]/mL (ref 0.35–4.50)

## 2021-04-13 NOTE — Progress Notes (Addendum)
Subjective:    Patient ID: Natalie Hall, female    DOB: 1951/04/04, 70 y.o.   MRN: [id number]  HPI Here to follow up on issues. She feels well. She sees Cardiology regularly. She sees Ophthalmology regularly. She is due for another colonoscopy in September. She sees the [facility] and they have been keeping an eye on her blood glucoses. Her last A1c in February was down to 5.5. Her OA and GERD are stable.    Review of Systems  Constitutional: Negative.   HENT: Negative.    Eyes: Negative.   Respiratory: Negative.    Cardiovascular: Negative.   Gastrointestinal: Negative.   Genitourinary:  Negative for decreased urine volume, difficulty urinating, dyspareunia, dysuria, enuresis, flank pain, frequency, hematuria, pelvic pain and urgency.  Musculoskeletal: Negative.   Skin: Negative.   Neurological: Negative.  Negative for headaches.  Psychiatric/Behavioral: Negative.        Objective:   Physical Exam Constitutional:      General: She is not in acute distress.    Appearance: She is well-developed.  HENT:     Head: Normocephalic and atraumatic.     Right Ear: External ear normal.     Left Ear: External ear normal.     Nose: Nose normal.     Mouth/Throat:     Pharynx: No oropharyngeal exudate.  Eyes:     General: No scleral icterus.    Conjunctiva/sclera: Conjunctivae normal.     Pupils: Pupils are equal, round, and reactive to light.  Neck:     Thyroid: No thyromegaly.     Vascular: No JVD.  Cardiovascular:     Rate and Rhythm: Normal rate and regular rhythm.     Heart sounds: Normal heart sounds. No murmur heard.   No friction rub. No gallop.  Pulmonary:     Effort: Pulmonary effort is normal. No respiratory distress.     Breath sounds: Normal breath sounds. No wheezing or rales.  Chest:     Chest [place]: No tenderness.  Abdominal:     General: Bowel sounds are normal. There is no distension.     Palpations: Abdomen is soft. There is no mass.      Tenderness: There is no abdominal tenderness. There is no guarding or rebound.  Musculoskeletal:        General: No tenderness. Normal range of motion.     Cervical back: Normal range of motion and neck supple.  Lymphadenopathy:     Cervical: No cervical adenopathy.  Skin:    General: Skin is warm and dry.     Findings: No erythema or rash.  Neurological:     Mental Status: She is alert and oriented to person, place, and time.     Cranial Nerves: No cranial nerve deficit.     Motor: No abnormal muscle tone.     Coordination: Coordination normal.     Deep Tendon Reflexes: Reflexes are normal and symmetric. Reflexes normal.  Psychiatric:        Behavior: Behavior normal.        Thought Content: Thought content normal.        Judgment: Judgment normal.          Assessment & Plan:  Her CAD and HTN are stable. Her OA and GERD are stable. 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 Because of this,we will set up an abdominal US and a CXR soon as usual. Get fasting labs to check A1c  for prediabetes. Also check lipids, CBC, liver panel, TSH, free T3, free T4, and BMET for hyperlipidemia. We spent 35 minutes reviewing her records and discussing these issues.  Alysia Penna, MD

## 2021-04-14 LAB — INSULIN, RANDOM: Insulin: 15.6 u[IU]/mL

## 2021-04-16 ENCOUNTER — Encounter (INDEPENDENT_AMBULATORY_CARE_PROVIDER_SITE_OTHER): Payer: Self-pay | Admitting: Family Medicine

## 2021-05-02 ENCOUNTER — Ambulatory Visit (INDEPENDENT_AMBULATORY_CARE_PROVIDER_SITE_OTHER): Payer: Medicare Other | Admitting: Family Medicine

## 2021-05-02 ENCOUNTER — Other Ambulatory Visit: Payer: Self-pay

## 2021-05-02 ENCOUNTER — Encounter (INDEPENDENT_AMBULATORY_CARE_PROVIDER_SITE_OTHER): Payer: Self-pay | Admitting: Family Medicine

## 2021-05-02 VITALS — BP 107/68 | HR 63 | Temp 98.2°F | Ht 64.0 in | Wt 182.0 lb

## 2021-05-02 DIAGNOSIS — Z6841 Body Mass Index (BMI) 40.0 and over, adult: Secondary | ICD-10-CM | POA: Diagnosis not present

## 2021-05-02 DIAGNOSIS — R7303 Prediabetes: Secondary | ICD-10-CM | POA: Diagnosis not present

## 2021-05-08 ENCOUNTER — Ambulatory Visit
Admission: RE | Admit: 2021-05-08 | Discharge: 2021-05-08 | Disposition: A | Discharge status: Home / Self Care | Payer: Medicare Other | Source: Ambulatory Visit | Attending: Family Medicine | Admitting: Family Medicine

## 2021-05-08 DIAGNOSIS — C6992 Malignant neoplasm of unspecified site of left eye: Secondary | ICD-10-CM

## 2021-05-09 NOTE — Progress Notes (Signed)
Chief Complaint:   OBESITY Antoinett is here to discuss her progress with her obesity treatment plan along with follow-up of her obesity related diagnoses. Janisa is on the Category 2 Plan and states she is following her eating plan approximately 80% of the time. Addi states she is doing silver sneakers 60 minutes 3 times per week.  Today's visit was #: 70 Starting weight: 213 lbs Starting date: 08/04/2018 Today's weight: 182 lbs Today's date: 05/02/2021 Total lbs lost to date: 31 lbs Total lbs lost since last in-office visit: 0  Interim History: Shaquila continues to be consistent with exercise. She feels her knee pain is better since she started exercising. She goes to the [employer] to get vegetables. She is not always getting in her full amount of protein at lunch and dinner. Her water intake is good. She want to lose more weight but we discussed her current weight is acceptable. Weight has been essentially the same for almost 2 years (between 176 -182).  Subjective:   1. Prediabetes Kynisha's last A1C was elevated at 5.9 on Metformin. Her A1C was at 6.0 2 years ago. She notes one of her parents had severe diabetes.  Lab Results  Component Value Date   HGBA1C 5.9 04/13/2021   Lab Results  Component Value Date   INSULIN 16.6 12/06/2020   INSULIN 18.5 11/17/2019   INSULIN 15.4 11/20/2018   INSULIN 33.5 (H) 08/04/2018     Assessment/Plan:   1. Prediabetes Jetta will continue taking Metformin and she will continue with her meal plan.   2. Obesity: Current BMI 31.22 Leonila is currently in the action stage of change. As such, her goal is to continue with weight loss efforts. She has agreed to the Category 2 Plan.   Exercise goals:  As is.  Behavioral modification strategies: decreasing simple carbohydrates.  Yannet has agreed to follow-up with our clinic in 3 weeks.  Objective:   Blood pressure 107/68, pulse 63, temperature 98.2 F (36.8 C), height 5\' 4"   (1.626 m), weight 182 lb (82.6 kg), SpO2 97 %. Body mass index is 31.24 kg/m.  General: Cooperative, alert, well developed, in no acute distress. HEENT: Conjunctivae and lids unremarkable. Cardiovascular: Regular rhythm.  Lungs: Normal work of breathing. Neurologic: No focal deficits.   Lab Results  Component Value Date   CREATININE 0.63 04/13/2021   BUN 20 04/13/2021   NA 138 04/13/2021   K 4.6 04/13/2021   CL 103 04/13/2021   CO2 28 04/13/2021   Lab Results  Component Value Date   ALT 22 04/13/2021   AST 24 04/13/2021   ALKPHOS 36 (L) 04/13/2021   BILITOT 0.5 04/13/2021   Lab Results  Component Value Date   HGBA1C 5.9 04/13/2021   HGBA1C 5.5 12/06/2020   HGBA1C 6.0 03/31/2020   HGBA1C 5.6 11/17/2019   HGBA1C 6.0 03/31/2019   Lab Results  Component Value Date   INSULIN 16.6 12/06/2020   INSULIN 18.5 11/17/2019   INSULIN 15.4 11/20/2018   INSULIN 33.5 (H) 08/04/2018   Lab Results  Component Value Date   TSH 1.31 04/13/2021   Lab Results  Component Value Date   CHOL 111 04/13/2021   HDL 28.80 (L) 04/13/2021   LDLCALC 50 04/13/2021   LDLDIRECT 96.0 03/11/2018   TRIG 164.0 (H) 04/13/2021   CHOLHDL 4 04/13/2021   Lab Results  Component Value Date   VD25OH 51.1 12/06/2020   VD25OH 105.10 ([place]) 03/31/2020   VD25OH 69.1 11/17/2019  Lab Results  Component Value Date   WBC 4.9 04/13/2021   HGB 13.0 04/13/2021   HCT 38.5 04/13/2021   MCV 87.4 04/13/2021   PLT 186.0 04/13/2021   No results found for: IRON, TIBC, FERRITIN  Obesity Behavioral Intervention:   Approximately 15 minutes were spent on the discussion below.  ASK: We discussed the diagnosis of obesity with [name] today and Yazmyne agreed to give [name] permission to discuss obesity behavioral modification therapy today.  ASSESS: Americus has the diagnosis of obesity and her BMI today is 31.3. Nyeema is in the action stage of change.   ADVISE: Lorenna was educated on the multiple health risks of  obesity as well as the benefit of weight loss to improve her health. She was advised of the need for long term treatment and the importance of lifestyle modifications to improve her current health and to decrease her risk of future health problems.  AGREE: Multiple dietary modification options and treatment options were discussed and Chiyeko agreed to follow the recommendations documented in the above note.  ARRANGE: Tysheka was educated on the importance of frequent visits to treat obesity as outlined per CMS and USPSTF guidelines and agreed to schedule her next follow up appointment today.  Attestation Statements:   Reviewed by clinician on day of visit: allergies, medications, problem list, medical history, surgical history, family history, social history, and previous encounter notes.  I, Lizbeth Bark, RMA, am acting as Location manager for [employer], [place].  I have reviewed the above documentation for accuracy and completeness, and I agree with the above. -  Georgianne Fick, FNP

## 2021-05-19 ENCOUNTER — Other Ambulatory Visit: Payer: Self-pay

## 2021-05-19 ENCOUNTER — Ambulatory Visit (INDEPENDENT_AMBULATORY_CARE_PROVIDER_SITE_OTHER): Payer: Medicare Other | Admitting: Podiatry

## 2021-05-19 DIAGNOSIS — M21961 Unspecified acquired deformity of right lower leg: Secondary | ICD-10-CM | POA: Diagnosis not present

## 2021-05-19 DIAGNOSIS — Z85840 Personal history of malignant neoplasm of eye: Secondary | ICD-10-CM | POA: Insufficient documentation

## 2021-05-19 DIAGNOSIS — L821 Other seborrheic keratosis: Secondary | ICD-10-CM | POA: Insufficient documentation

## 2021-05-19 DIAGNOSIS — C44622 Squamous cell carcinoma of skin of right upper limb, including shoulder: Secondary | ICD-10-CM | POA: Insufficient documentation

## 2021-05-19 DIAGNOSIS — M7751 Other enthesopathy of right foot: Secondary | ICD-10-CM | POA: Diagnosis not present

## 2021-05-19 DIAGNOSIS — D1800 Hemangioma unspecified site: Secondary | ICD-10-CM | POA: Insufficient documentation

## 2021-05-19 NOTE — Progress Notes (Signed)
  Subjective:  Patient ID: Natalie Hall, female    DOB: June 02, 1951,  MRN: [id number]  Chief Complaint  Patient presents with   Callouses    Right 1st digit callous pt states it has split and bled. Pt has treated with bandage and neosporin.    70 y.o. female presents with the above complaint. History confirmed with patient.   Objective:  Physical Exam: warm, good capillary refill, no trophic changes or ulcerative lesions, normal DP and PT pulses and normal sensory exam. Left Foot: normal exam, no swelling, tenderness, instability; ligaments intact, full range of motion of all ankle/foot joints  Right Foot: normal exam, no swelling, tenderness, instability; ligaments intact, full range of motion of all ankle/foot joints. HPK submet 42m medial 1st MPJ and 1st toe  Assessment:   1. Capsulitis of metatarsophalangeal (MTP) joint of right foot   2. Metatarsal deformity, right      Plan:  Patient was evaluated and treated and all questions answered.  Capsulitis -Lesions pared courtesy to patient relief. Dressed right 5th met area with salinocaine. Discussed shoe gear options to reduce pressure. F/u PRN  No follow-ups on file.

## 2021-05-23 ENCOUNTER — Ambulatory Visit (INDEPENDENT_AMBULATORY_CARE_PROVIDER_SITE_OTHER): Payer: Medicare Other | Admitting: Family Medicine

## 2021-05-29 ENCOUNTER — Encounter: Payer: Self-pay | Admitting: Gastroenterology

## 2021-05-31 ENCOUNTER — Other Ambulatory Visit: Payer: Self-pay

## 2021-05-31 ENCOUNTER — Ambulatory Visit (INDEPENDENT_AMBULATORY_CARE_PROVIDER_SITE_OTHER): Payer: Medicare Other | Admitting: Family Medicine

## 2021-05-31 ENCOUNTER — Encounter (INDEPENDENT_AMBULATORY_CARE_PROVIDER_SITE_OTHER): Payer: Self-pay | Admitting: Family Medicine

## 2021-05-31 VITALS — BP 94/60 | HR 64 | Temp 97.7°F | Ht 64.0 in | Wt 180.0 lb

## 2021-05-31 DIAGNOSIS — Z6841 Body Mass Index (BMI) 40.0 and over, adult: Secondary | ICD-10-CM | POA: Diagnosis not present

## 2021-05-31 DIAGNOSIS — R7303 Prediabetes: Secondary | ICD-10-CM

## 2021-05-31 DIAGNOSIS — I10 Essential (primary) hypertension: Secondary | ICD-10-CM | POA: Diagnosis not present

## 2021-05-31 NOTE — Progress Notes (Signed)
Chief Complaint:   OBESITY Natalie Hall is here to discuss her progress with her obesity treatment plan along with follow-up of her obesity related diagnoses. Natalie Hall is on the Category 2 Plan and states she is following her eating plan approximately 75-80% of the time. Natalie Hall states she is doing silver sneakers for 60 minutes 3 times per week.  Today's visit was #: 47 Starting weight: 213 lbs Starting date: 08/04/2018 Today's weight: 180 lbs Today's date: 05/31/2021 Total lbs lost to date: 33 lbs Total lbs lost since last in-office visit: 2 lbs  Interim History: Natalie Hall has adhered to the plan very well over the past week or so. She did have some treats on her birthday but then increased adherence to the plan. She is exercising consistently through [employer] but says she has to force herself to go. However, she feels it has helped her joint pain.   Subjective:   1. Prediabetes Natalie Hall's last A1C was 5.9. She is on Metformin 500 mg daily at breakfast. Appetite is well controlled.  Lab Results  Component Value Date   HGBA1C 5.9 04/13/2021   Lab Results  Component Value Date   INSULIN 16.6 12/06/2020   INSULIN 18.5 11/17/2019   INSULIN 15.4 11/20/2018   INSULIN 33.5 (H) 08/04/2018    2. Essential hypertension Natalie Hall's BP was low today 94/60. She denies orthostatic sx but notes fatigue. She is currently taking Altace 10 mg and Metoprolol 25 mg cardiology.  BP Readings from Last 3 Encounters:  05/31/21 94/60  05/02/21 107/68  04/13/21 126/70     Assessment/Plan:   1. Prediabetes Natalie Hall will continue Metformin. She will continue to work on weight loss, exercise, and decreasing simple carbohydrates to help decrease the risk of diabetes.    2. Essential hypertension  I encouraged Natalie Hall to call cardiology if she has orthostatic symptoms.   3. Obesity: Current BMI 30.88 Natalie Hall is currently in the action stage of change. As such, her goal is to continue with weight  loss efforts. She has agreed to the Category 2 Plan.   Exercise goals:  As is.  Behavioral modification strategies: planning for success.  Natalie Hall has agreed to follow-up with our clinic in 3-4 weeks.  Objective:   Blood pressure 94/60, pulse 64, temperature 97.7 F (36.5 C), height '5\' 4"'$  (1.626 m), weight 180 lb (81.6 kg), SpO2 98 %. Body mass index is 30.9 kg/m.  General: Cooperative, alert, well developed, in no acute distress. HEENT: Conjunctivae and lids unremarkable. Cardiovascular: Regular rhythm.  Lungs: Normal work of breathing. Neurologic: No focal deficits.   Lab Results  Component Value Date   CREATININE 0.63 04/13/2021   BUN 20 04/13/2021   NA 138 04/13/2021   K 4.6 04/13/2021   CL 103 04/13/2021   CO2 28 04/13/2021   Lab Results  Component Value Date   ALT 22 04/13/2021   AST 24 04/13/2021   ALKPHOS 36 (L) 04/13/2021   BILITOT 0.5 04/13/2021   Lab Results  Component Value Date   HGBA1C 5.9 04/13/2021   HGBA1C 5.5 12/06/2020   HGBA1C 6.0 03/31/2020   HGBA1C 5.6 11/17/2019   HGBA1C 6.0 03/31/2019   Lab Results  Component Value Date   INSULIN 16.6 12/06/2020   INSULIN 18.5 11/17/2019   INSULIN 15.4 11/20/2018   INSULIN 33.5 (H) 08/04/2018   Lab Results  Component Value Date   TSH 1.31 04/13/2021   Lab Results  Component Value Date   CHOL 111 04/13/2021   HDL  28.80 (L) 04/13/2021   LDLCALC 50 04/13/2021   LDLDIRECT 96.0 03/11/2018   TRIG 164.0 (H) 04/13/2021   CHOLHDL 4 04/13/2021   Lab Results  Component Value Date   VD25OH 51.1 12/06/2020   VD25OH 105.10 (HH) 03/31/2020   VD25OH 69.1 11/17/2019   Lab Results  Component Value Date   WBC 4.9 04/13/2021   HGB 13.0 04/13/2021   HCT 38.5 04/13/2021   MCV 87.4 04/13/2021   PLT 186.0 04/13/2021   No results found for: IRON, TIBC, FERRITIN  Obesity Behavioral Intervention:   Approximately 15 minutes were spent on the discussion below.  ASK: We discussed the diagnosis of obesity  with Natalie Hall today and Natalie Hall agreed to give [name] permission to discuss obesity behavioral modification therapy today.  ASSESS: Natalie Hall has the diagnosis of obesity and her BMI today is 30.9. Natalie Hall is in the action stage of change.   ADVISE: Natalie Hall was educated on the multiple health risks of obesity as well as the benefit of weight loss to improve her health. She was advised of the need for long term treatment and the importance of lifestyle modifications to improve her current health and to decrease her risk of future health problems.  AGREE: Multiple dietary modification options and treatment options were discussed and Arieana agreed to follow the recommendations documented in the above note.  ARRANGE: Natalie Hall was educated on the importance of frequent visits to treat obesity as outlined per CMS and USPSTF guidelines and agreed to schedule her next follow up appointment today.  Attestation Statements:   Reviewed by clinician on day of visit: allergies, medications, problem list, medical history, surgical history, family history, social history, and previous encounter notes.   I, Natalie Hall, RMA, am acting as Location manager for [employer], [place].   I have reviewed the above documentation for accuracy and completeness, and I agree with the above. -  Natalie Fick, FNP

## 2021-06-01 ENCOUNTER — Other Ambulatory Visit: Payer: Self-pay

## 2021-06-02 ENCOUNTER — Ambulatory Visit (INDEPENDENT_AMBULATORY_CARE_PROVIDER_SITE_OTHER): Payer: Medicare Other | Admitting: Family Medicine

## 2021-06-02 ENCOUNTER — Encounter: Payer: Self-pay | Admitting: Family Medicine

## 2021-06-02 VITALS — BP 102/62 | HR 66 | Temp 98.6°F | Wt 185.0 lb

## 2021-06-02 DIAGNOSIS — R310 Gross hematuria: Secondary | ICD-10-CM | POA: Diagnosis not present

## 2021-06-02 DIAGNOSIS — N390 Urinary tract infection, site not specified: Secondary | ICD-10-CM

## 2021-06-02 LAB — POC URINALSYSI DIPSTICK (AUTOMATED)
Bilirubin, UA: NEGATIVE
Glucose, UA: NEGATIVE
Ketones, UA: NEGATIVE
Leukocytes, UA: NEGATIVE
Nitrite, UA: NEGATIVE
Protein, UA: NEGATIVE
Spec Grav, UA: 1.025 (ref 1.010–1.025)
Urobilinogen, UA: 0.2 E.U./dL
pH, UA: 5.5 (ref 5.0–8.0)

## 2021-06-02 NOTE — Progress Notes (Signed)
   Subjective:    Patient ID: Natalie Hall, female    DOB: 11/08/1950, 70 y.o.   MRN: [id number]  HPI Here to follow up on 2 recent episodes of blood in the urine. She went to an urgent care in [facility] on 04-24-21 with blood in the urine, urgency and burning. She was given a course of Nitrofurantoin, but the culture came back negative. These symptoms returned so she went back to the urgent care on 05-28-21, and this time she was given Bactrim DS. Again the culture returned as negative. Each time she has taken antibiotics she has felt better. Otherwise no back pain or nausea or fever. She has never smoked.    Review of Systems  Constitutional: Negative.   Respiratory: Negative.    Cardiovascular: Negative.   Gastrointestinal: Negative.   Genitourinary:  Positive for dysuria, frequency, hematuria and urgency. Negative for flank pain.      Objective:   Physical Exam Constitutional:      Appearance: Normal appearance. She is not ill-appearing.  Cardiovascular:     Rate and Rhythm: Normal rate and regular rhythm.     Pulses: Normal pulses.     Heart sounds: Normal heart sounds.  Pulmonary:     Effort: Pulmonary effort is normal.     Breath sounds: Normal breath sounds.  Abdominal:     General: Abdomen is flat. Bowel sounds are normal. There is no distension.     Palpations: Abdomen is soft. There is no mass.     Tenderness: There is no abdominal tenderness. There is no right CVA tenderness, left CVA tenderness, guarding or rebound.     Hernia: No hernia is present.  Neurological:     Mental Status: She is alert.          Assessment & Plan:  Recurrent hematuria, it sounds likes she has interstitial cystitis. We will refer her to Urology for further evaluation.  Alysia Penna, MD

## 2021-06-27 ENCOUNTER — Ambulatory Visit (INDEPENDENT_AMBULATORY_CARE_PROVIDER_SITE_OTHER): Payer: Medicare Other | Admitting: Family Medicine

## 2021-07-06 ENCOUNTER — Other Ambulatory Visit (INDEPENDENT_AMBULATORY_CARE_PROVIDER_SITE_OTHER): Payer: Self-pay | Admitting: Family Medicine

## 2021-07-06 DIAGNOSIS — R7303 Prediabetes: Secondary | ICD-10-CM

## 2021-07-06 NOTE — Telephone Encounter (Signed)
Last OV with Dawn 

## 2021-07-11 ENCOUNTER — Encounter (INDEPENDENT_AMBULATORY_CARE_PROVIDER_SITE_OTHER): Payer: Self-pay | Admitting: Physician Assistant

## 2021-07-11 ENCOUNTER — Other Ambulatory Visit: Payer: Self-pay

## 2021-07-11 ENCOUNTER — Ambulatory Visit (INDEPENDENT_AMBULATORY_CARE_PROVIDER_SITE_OTHER): Payer: Medicare Other | Admitting: Physician Assistant

## 2021-07-11 DIAGNOSIS — R7303 Prediabetes: Secondary | ICD-10-CM

## 2021-07-11 DIAGNOSIS — Z6841 Body Mass Index (BMI) 40.0 and over, adult: Secondary | ICD-10-CM | POA: Diagnosis not present

## 2021-07-11 MED ORDER — METFORMIN HCL 500 MG PO TABS: 500.0000 mg | ORAL_TABLET | Freq: Every day | ORAL | 0 refills | Status: DC

## 2021-07-11 NOTE — Progress Notes (Signed)
Chief Complaint:   OBESITY Felcia is here to discuss her progress with her obesity treatment plan along with follow-up of her obesity related diagnoses. Jasiya is on the Category 2 Plan and states she is following her eating plan approximately 70-80% of the time. Arabella states she is doing silver sneakers for 60 minutes 3 times per week.  Today's visit was #: 45 Starting weight: 213 lbs Starting date: 08/04/2018 Today's weight: 181 lbs Today's date: 07/11/2021 Total lbs lost to date: 32 lbs Total lbs lost since last in-office visit: 0  Interim History: Alicia reports that the first 2 weeks she "blew it" and the last 2 weeks she followed plan well. She is having trouble eating all of the protein on the plan. She is going on vacation in 3 weeks.   Subjective:   1. Pre-diabetes Eduarda's last A1C level was 5.9. She is on Metformin daily. She has a diagnosis of prediabetes based on her elevated HgA1c and was informed this puts her at greater risk of developing diabetes. She continues to work on diet and exercise to decrease her risk of diabetes. She denies nausea or hypoglycemia.  Assessment/Plan:   1. Pre-diabetes We will refill Metformin for 3 months with no refills. Vanna will continue to work on weight loss, exercise, and decreasing simple carbohydrates to help decrease the risk of diabetes.   - metFORMIN (GLUCOPHAGE) 500 MG tablet; Take 1 tablet (500 mg total) by mouth daily with breakfast.  Dispense: 90 tablet; Refill: 0  2. Obesity: Current BMI 31.05 Elaijah is currently in the action stage of change. As such, her goal is to continue with weight loss efforts. She has agreed to the Category 2 Plan.   Exercise goals: As is.  Behavioral modification strategies: increasing lean protein intake and travel eating strategies.  Alyxandra has agreed to follow-up with our clinic in 4 weeks. She was informed of the importance of frequent follow-up visits to maximize her success with  intensive lifestyle modifications for her multiple health conditions.   Objective:   Blood pressure (!) 116/55, pulse 65, temperature 98 F (36.7 C), height 5\' 4"  (1.626 m), weight 181 lb (82.1 kg), SpO2 98 %. Body mass index is 31.07 kg/m.  General: Cooperative, alert, well developed, in no acute distress. HEENT: Conjunctivae and lids unremarkable. Cardiovascular: Regular rhythm.  Lungs: Normal work of breathing. Neurologic: No focal deficits.   Lab Results  Component Value Date   CREATININE 0.63 04/13/2021   BUN 20 04/13/2021   NA 138 04/13/2021   K 4.6 04/13/2021   CL 103 04/13/2021   CO2 28 04/13/2021   Lab Results  Component Value Date   ALT 22 04/13/2021   AST 24 04/13/2021   ALKPHOS 36 (L) 04/13/2021   BILITOT 0.5 04/13/2021   Lab Results  Component Value Date   HGBA1C 5.9 04/13/2021   HGBA1C 5.5 12/06/2020   HGBA1C 6.0 03/31/2020   HGBA1C 5.6 11/17/2019   HGBA1C 6.0 03/31/2019   Lab Results  Component Value Date   INSULIN 16.6 12/06/2020   INSULIN 18.5 11/17/2019   INSULIN 15.4 11/20/2018   INSULIN 33.5 (H) 08/04/2018   Lab Results  Component Value Date   TSH 1.31 04/13/2021   Lab Results  Component Value Date   CHOL 111 04/13/2021   HDL 28.80 (L) 04/13/2021   LDLCALC 50 04/13/2021   LDLDIRECT 96.0 03/11/2018   TRIG 164.0 (H) 04/13/2021   CHOLHDL 4 04/13/2021   Lab Results  Component Value  Date   VD25OH 51.1 12/06/2020   VD25OH 105.10 (HH) 03/31/2020   VD25OH 69.1 11/17/2019   Lab Results  Component Value Date   WBC 4.9 04/13/2021   HGB 13.0 04/13/2021   HCT 38.5 04/13/2021   MCV 87.4 04/13/2021   PLT 186.0 04/13/2021   No results found for: IRON, TIBC, FERRITIN  Obesity Behavioral Intervention:   Approximately 15 minutes were spent on the discussion below.  ASK: We discussed the diagnosis of obesity with [name] today and Cesia agreed to give [name] permission to discuss obesity behavioral modification therapy  today.  ASSESS: Jaydy has the diagnosis of obesity and her BMI today is 31.2. Tifini is in the action stage of change.   ADVISE: Shayma was educated on the multiple health risks of obesity as well as the benefit of weight loss to improve her health. She was advised of the need for long term treatment and the importance of lifestyle modifications to improve her current health and to decrease her risk of future health problems.  AGREE: Multiple dietary modification options and treatment options were discussed and Mark agreed to follow the recommendations documented in the above note.  ARRANGE: Jeniya was educated on the importance of frequent visits to treat obesity as outlined per CMS and USPSTF guidelines and agreed to schedule her next follow up appointment today.  Attestation Statements:   Reviewed by clinician on day of visit: allergies, medications, problem list, medical history, surgical history, family history, social history, and previous encounter notes.  I, Tonye Pearson, am acting as Location manager for [employer], PA-C.   I have reviewed the above documentation for accuracy and completeness, and I agree with the above. Abby Potash, PA-C

## 2021-07-18 ENCOUNTER — Ambulatory Visit (AMBULATORY_SURGERY_CENTER): Payer: Medicare Other

## 2021-07-18 ENCOUNTER — Encounter: Payer: Self-pay | Admitting: Gastroenterology

## 2021-07-18 ENCOUNTER — Other Ambulatory Visit: Payer: Self-pay

## 2021-07-18 VITALS — Ht 64.0 in | Wt 180.0 lb

## 2021-07-18 DIAGNOSIS — Z86010 Personal history of colonic polyps: Secondary | ICD-10-CM

## 2021-07-18 NOTE — Progress Notes (Signed)
    Patient's pre-visit was done today over the phone with the patient   Name,DOB and address verified.   Patient denies any allergies to Eggs and Soy.  Patient denies any problems with anesthesia/sedation. Patient denies taking diet pills or blood thinners.  Denies atrial flutter or atrial fib  Chronic constipation  goes to bathroom about 2 times/week No home Oxygen.   Packet of Prep instructions mailed to patient including a copy of a consent form-pt is aware.  Patient understands to call us back with any questions or concerns.  Patient is aware of our care-partner policy and [id number] safety protocol.    Pt stated does not have diabetes and that is on metformin D/T treatment at [facility] healthy Weight and wellness

## 2021-08-08 ENCOUNTER — Ambulatory Visit (INDEPENDENT_AMBULATORY_CARE_PROVIDER_SITE_OTHER): Payer: Medicare Other | Admitting: Family Medicine

## 2021-08-14 ENCOUNTER — Encounter: Payer: Self-pay | Admitting: Gastroenterology

## 2021-08-14 ENCOUNTER — Ambulatory Visit (AMBULATORY_SURGERY_CENTER): Payer: Medicare Other | Admitting: Gastroenterology

## 2021-08-14 VITALS — BP 118/43 | HR 60 | Temp 97.5°F | Resp 16 | Ht 64.0 in | Wt 180.0 lb

## 2021-08-14 DIAGNOSIS — D123 Benign neoplasm of transverse colon: Secondary | ICD-10-CM

## 2021-08-14 DIAGNOSIS — D124 Benign neoplasm of descending colon: Secondary | ICD-10-CM | POA: Diagnosis not present

## 2021-08-14 DIAGNOSIS — Z86010 Personal history of colonic polyps: Secondary | ICD-10-CM

## 2021-08-14 HISTORY — PX: COLONOSCOPY: SHX174

## 2021-08-14 HISTORY — PX: COLONOSCOPY WITH PROPOFOL: SHX5780

## 2021-08-14 MED ORDER — SODIUM CHLORIDE 0.9 % IV SOLN: 500.0000 mL | Freq: Once | INTRAVENOUS | Status: DC

## 2021-08-14 NOTE — Progress Notes (Signed)
To PACU, VSS. Report to [email or web address] 

## 2021-08-14 NOTE — Progress Notes (Signed)
HPI: This is a woman with h/o adenomatous polyps    ROS: complete GI ROS as described in HPI, all other review negative.  Constitutional:  No unintentional weight loss   Past Medical History:  Diagnosis Date   Asthma    infrequent problem - no treatment since 2006   Back pain    Choroidal nevus of left eye    sees Dr. Gerarda Fraction at [place].    Constipation    Coronary artery disease    Dysrhythmia    SVT - ablation by Dr. Curt Bears on 05/21/2016 and episodes since   Fatty liver    GERD (gastroesophageal reflux disease)    History of hiatal hernia    "very small"   Hx of colonic polyp    Hyperlipidemia    Hypertension    Joint pain    Melanoma of eye, left ([place])    sees Dr. Cordelia Pen   Myocardial infarction [facility])    Osteoarthritis    Osteoporosis    osteopenia   Shortness of breath    Supraventricular tachycardia ([place])     Past Surgical History:  Procedure Laterality Date   ABDOMINAL HYSTERECTOMY     ACHILLES TENDON REPAIR  10-07-11   torn on right, per Dr. Marge Duncans SURGERY     torn tendon left ankle   BREAST BIOPSY     CARDIAC CATHETERIZATION N/A 03/14/2016   Procedure: Left Heart Cath and Coronary Angiography;  Surgeon: Peter M Martinique, MD;  Location: [facility];  Service: Cardiovascular;  Laterality: N/A;   CARDIAC CATHETERIZATION N/A 03/14/2016   Procedure: Coronary Stent Intervention;  Surgeon: Peter M Martinique, MD;  Location: [facility];  Service: Cardiovascular;  Laterality: N/A;   CARPAL TUNNEL RELEASE     CATARACT EXTRACTION W/ INTRAOCULAR LENS IMPLANT Bilateral    CHOLECYSTECTOMY     COLONOSCOPY  06/23/2014   per Dr. Ardis Hughs, adenomatous polyps, repeat in 7 yrs     ELECTROPHYSIOLOGIC STUDY N/A 05/21/2016   Procedure: SVT Ablation;  Surgeon: [name], MD;  Location: [facility];  Service: Cardiovascular;  Laterality: N/A;   EXCISION HAGLUND'S DEFORMITY WITH ACHILLES TENDON REPAIR Left 07/02/2019   Procedure: Left  Achilles tendon debridement and reconstruction and excision of Haglund deformity;  Surgeon: Wylene Simmer, MD;  Location: [facility];  Service: Orthopedics;  Laterality: Left;   EYE SURGERY Left 08/2016   GASTROCNEMIUS RECESSION Left 07/02/2019   Procedure: Gastroc recession;  Surgeon: Wylene Simmer, MD;  Location: [facility];  Service: Orthopedics;  Laterality: Left;   IR KYPHO THORACIC WITH BONE BIOPSY  12/18/2018   IR RADIOLOGIST EVAL & MGMT  12/09/2018   IR RADIOLOGIST EVAL & MGMT  04/30/2019   KNEE CLOSED REDUCTION Left 02/03/2014   Procedure: CLOSED MANIPULATION LEFT KNEE;  Surgeon: Gearlean Alf, MD;  Location: [facility];  Service: Orthopedics;  Laterality: Left;   POLYPECTOMY     REVERSE SHOULDER ARTHROPLASTY     Left   REVERSE SHOULDER ARTHROPLASTY Left 06/28/2017   Procedure: LEFT SHOULDER REVERSE SHOULDER ARTHROPLASTY;  Surgeon: Netta Cedars, MD;  Location: [facility];  Service: Orthopedics;  Laterality: Left;   TOTAL KNEE ARTHROPLASTY Left 12/21/2013   Procedure: LEFT TOTAL KNEE ARTHROPLASTY;  Surgeon: Gearlean Alf, MD;  Location: [facility];  Service: Orthopedics;  Laterality: Left;   VARICOSE VEIN SURGERY      Current Outpatient Medications  Medication Sig Dispense Refill  acetaminophen (TYLENOL) 500 MG tablet Take 500 mg by mouth every 6 (six) hours as needed (pain).      aspirin EC 81 MG tablet Take 81 mg by mouth daily.     Calcium Carb-Cholecalciferol 600-800 MG-UNIT TABS Take 1 tablet by mouth daily.      cetirizine (ZYRTEC) 10 MG tablet Take 10 mg by mouth daily as needed for allergies.     Cholecalciferol (VITAMIN D) 50 MCG (2000 UT) CAPS Take 1 capsule (2,000 Units total) by mouth daily. 30 capsule 0   ezetimibe (ZETIA) 10 MG tablet TAKE 1 TABLET BY MOUTH  DAILY 90 tablet 3   fluticasone (FLONASE) 50 MCG/ACT nasal spray Place into both nostrils daily.     metFORMIN (GLUCOPHAGE) 500 MG tablet Take 1 tablet (500 mg total) by mouth daily with breakfast. 90  tablet 0   metoprolol succinate (TOPROL-XL) 25 MG 24 hr tablet TAKE 1 TABLET BY MOUTH  TWICE DAILY 180 tablet 3   Multiple Vitamins-Minerals (MULTIVITAL PO) Take by mouth.     omeprazole (PRILOSEC) 40 MG capsule TAKE 1 CAPSULE BY MOUTH  DAILY 90 capsule 3   ramipril (ALTACE) 10 MG capsule TAKE 1 CAPSULE BY MOUTH  DAILY 90 capsule 3   rosuvastatin (CRESTOR) 40 MG tablet TAKE 1 TABLET BY MOUTH  DAILY 90 tablet 3   temazepam (RESTORIL) 15 MG capsule TAKE 1 CAPSULE BY MOUTH  EVERY NIGHT AT BEDTIME AS  NEEDED FOR SLEEP 90 capsule 1   Current Facility-Administered Medications  Medication Dose Route Frequency Provider Last Rate Last Admin   0.9 %  sodium chloride infusion  500 mL Intravenous Once Milus Banister, MD        Allergies as of 08/14/2021 - Review Complete 08/14/2021  Allergen Reaction Noted   Codeine Other (See Comments), Shortness Of Breath, and Rash 11/24/2008   Levofloxacin in d5w Other (See Comments) 12/14/2015   Amoxicillin-pot clavulanate Rash 07/04/2015   Escitalopram oxalate Rash 02/11/2015    Family History  Problem Relation Age of Onset   Colon polyps Mother    Diabetes Mother    High blood pressure Mother    High Cholesterol Mother    Thyroid disease Mother    Anxiety disorder Mother    Colon cancer Maternal Aunt    Liver cancer Maternal Aunt    Breast cancer Other    Coronary artery disease Other    Colon cancer Other    Diabetes Other    Hypertension Other    Kidney disease Other    Lung cancer Other    Lung cancer Brother        spleen, bone   Diabetes Father    High blood pressure Father    High Cholesterol Father    Heart disease Father    Depression Father    Obesity Father    Esophageal cancer Neg Hx    Stomach cancer Neg Hx    Rectal cancer Neg Hx     Social History   Socioeconomic History   Marital status: Married    Spouse name: Runner, broadcasting/film/video   Number of children: Not on file   Years of education: Not on file   Highest education level:  Not on file  Occupational History   Occupation: retired  Tobacco Use   Smoking status: Never   Smokeless tobacco: Never  Vaping Use   Vaping Use: Never used  Substance and Sexual Activity   Alcohol use: No    Alcohol/week: 0.0 standard drinks  Drug use: No   Sexual activity: Not on file  Other Topics Concern   Not on file  Social History Narrative   Not on file   Social Determinants of Health   Financial Resource Strain: Low Risk    Difficulty of Paying Living Expenses: Not hard at all  Food Insecurity: No Food Insecurity   Worried About Running Out of Food in the Last Year: Never true   [place] in the Last Year: Never true  Transportation Needs: No Transportation Needs   Lack of Transportation (Medical): No   Lack of Transportation (Non-Medical): No  Physical Activity: Sufficiently Active   Days of Exercise per Week: 3 days   Minutes of Exercise per Session: 60 min  Stress: No Stress Concern Present   Feeling of Stress : Not at all  Social Connections: Socially Integrated   Frequency of Communication with Friends and Family: More than three times a week   Frequency of Social Gatherings with Friends and Family: More than three times a week   Attends Religious Services: More than 4 times per year   Active Member of [employer] or Organizations: Yes   Attends Music therapist: More than 4 times per year   Marital Status: Married  Human resources officer Violence: Not At Risk   Fear of Current or Ex-Partner: No   Emotionally Abused: No   Physically Abused: No   Sexually Abused: No     Physical Exam: Temp (!) 97.5 F (36.4 C) (Temporal)   LMP  (LMP Unknown)  Constitutional: generally well-appearing Psychiatric: alert and oriented x3 Lungs: CTA bilaterally Heart: no MCR  Assessment and plan: 69 y.o. female with h/o adenomatous polyps  Colonsocopy today  Care is appropriate for the ambulatory setting.  Owens Loffler, MD [facility] 08/14/2021, 2:07 PM

## 2021-08-14 NOTE — Patient Instructions (Signed)
Resume previous diet and medications. Awaiting pathology results. ? ?YOU HAD AN ENDOSCOPIC PROCEDURE TODAY AT THE [facility]:   Refer to the procedure report that was given to you for any specific questions about what was found during the examination.  If the procedure report does not answer your questions, please call your gastroenterologist to clarify.  If you requested that your care partner not be given the details of your procedure findings, then the procedure report has been included in a sealed envelope for you to review at your convenience later. ? ?YOU SHOULD EXPECT: Some feelings of bloating in the abdomen. Passage of more gas than usual.  Walking can help get rid of the air that was put into your GI tract during the procedure and reduce the bloating. If you had a lower endoscopy (such as a colonoscopy or flexible sigmoidoscopy) you may notice spotting of blood in your stool or on the toilet paper. If you underwent a bowel prep for your procedure, you may not have a normal bowel movement for a few days. ? ?Please Note:  You might notice some irritation and congestion in your nose or some drainage.  This is from the oxygen used during your procedure.  There is no need for concern and it should clear up in a day or so. ? ?SYMPTOMS TO REPORT IMMEDIATELY: ? ?Following lower endoscopy (colonoscopy or flexible sigmoidoscopy): ? Excessive amounts of blood in the stool ? Significant tenderness or worsening of abdominal pains ? Swelling of the abdomen that is new, acute ? Fever of 100?F or higher ? ?For urgent or emergent issues, a gastroenterologist can be reached at any hour by calling [phone]. ?Do not use MyChart messaging for urgent concerns.  ? ? ?DIET:  We do recommend a small meal at first, but then you may proceed to your regular diet.  Drink plenty of fluids but you should avoid alcoholic beverages for 24 hours. ? ?ACTIVITY:  You should plan to take it easy for the rest of today and  you should NOT DRIVE or use heavy machinery until tomorrow (because of the sedation medicines used during the test).   ? ?FOLLOW UP: ?Our staff will call the number listed on your records 48-72 hours following your procedure to check on you and address any questions or concerns that you may have regarding the information given to you following your procedure. If we do not reach you, we will leave a message.  We will attempt to reach you two times.  During this call, we will ask if you have developed any symptoms of COVID 19. If you develop any symptoms (ie: fever, flu-like symptoms, shortness of breath, cough etc.) before then, please call [phone].  If you test positive for Covid 19 in the 2 weeks post procedure, please call and report this information to us.   ? ?If any biopsies were taken you will be contacted by phone or by letter within the next 1-3 weeks.  Please call us at [phone] if you have not heard about the biopsies in 3 weeks.  ? ? ?SIGNATURES/CONFIDENTIALITY: ?You and/or your care partner have signed paperwork which will be entered into your electronic medical record.  These signatures attest to the fact that that the information above on your After Visit Summary has been reviewed and is understood.  Full responsibility of the confidentiality of this discharge information lies with you and/or your care-partner.  ?

## 2021-08-14 NOTE — Op Note (Signed)
[facility] Patient Name: Natalie Hall Procedure Date: 08/14/2021 2:09 PM MRN: [id number] Endoscopist: Milus Banister , MD Age: 70 Referring MD:  Date of Birth: 07-Sep-1951 Gender: Female Account #: [id number] Procedure:                Colonoscopy Indications:              High risk colon cancer surveillance: Personal                            history of colonic polyps; Colonoscopy 2015 two                            subCM adenomas Medicines:                Monitored Anesthesia Care Procedure:                Pre-Anesthesia Assessment:                           - Prior to the procedure, a History and Physical                            was performed, and patient medications and                            allergies were reviewed. The patient's tolerance of                            previous anesthesia was also reviewed. The risks                            and benefits of the procedure and the sedation                            options and risks were discussed with the patient.                            All questions were answered, and informed consent                            was obtained. Prior Anticoagulants: The patient has                            taken no previous anticoagulant or antiplatelet                            agents. ASA Grade Assessment: II - A patient with                            mild systemic disease. After reviewing the risks                            and benefits, the patient was deemed in  satisfactory condition to undergo the procedure.                           After obtaining informed consent, the colonoscope                            was passed under direct vision. Throughout the                            procedure, the patient's blood pressure, pulse, and                            oxygen saturations were monitored continuously. The                            Colonoscope was introduced through the anus and                             advanced to the the cecum, identified by                            appendiceal orifice and ileocecal valve. The                            colonoscopy was performed without difficulty. The                            patient tolerated the procedure well. The quality                            of the bowel preparation was good. The ileocecal                            valve, appendiceal orifice, and rectum were                            photographed. Scope In: 2:19:49 PM Scope Out: 2:33:13 PM Scope Withdrawal Time: 0 hours 9 minutes 52 seconds  Total Procedure Duration: 0 hours 13 minutes 24 seconds  Findings:                 Three sessile polyps were found in the descending                            colon and transverse colon. The polyps were 3 to 5                            mm in size. These polyps were removed with a cold                            snare. Resection and retrieval were complete.                           Multiple small and large-mouthed diverticula were  found in the left colon.                           The exam was otherwise without abnormality on                            direct and retroflexion views. Complications:            No immediate complications. Estimated blood loss:                            None. Estimated Blood Loss:     Estimated blood loss: none. Impression:               - Three 3 to 5 mm polyps in the descending colon                            and in the transverse colon, removed with a cold                            snare. Resected and retrieved.                           - Diverticulosis in the left colon.                           - The examination was otherwise normal on direct                            and retroflexion views. Recommendation:           - Patient has a contact number available for                            emergencies. The signs and symptoms of potential                             delayed complications were discussed with the                            patient. Return to normal activities tomorrow.                            Written discharge instructions were provided to the                            patient.                           - Resume previous diet.                           - Continue present medications.                           - Await pathology results. Milus Banister, MD 08/14/2021 2:35:41 PM This report has been signed electronically.

## 2021-08-14 NOTE — Progress Notes (Signed)
Called to room to assist during endoscopic procedure.  Patient ID and intended procedure confirmed with present staff. Received instructions for my participation in the procedure from the performing physician.  

## 2021-08-14 NOTE — Progress Notes (Signed)
Pt's states no medical or surgical changes since previsit or office visit.   VS AS

## 2021-08-16 ENCOUNTER — Other Ambulatory Visit: Payer: Self-pay

## 2021-08-16 ENCOUNTER — Telehealth: Payer: Self-pay

## 2021-08-16 ENCOUNTER — Ambulatory Visit (INDEPENDENT_AMBULATORY_CARE_PROVIDER_SITE_OTHER): Payer: Medicare Other | Admitting: Family Medicine

## 2021-08-16 ENCOUNTER — Encounter (INDEPENDENT_AMBULATORY_CARE_PROVIDER_SITE_OTHER): Payer: Self-pay | Admitting: Family Medicine

## 2021-08-16 VITALS — BP 132/76 | HR 61 | Temp 98.0°F | Ht 64.0 in | Wt 183.0 lb

## 2021-08-16 DIAGNOSIS — Z6841 Body Mass Index (BMI) 40.0 and over, adult: Secondary | ICD-10-CM | POA: Diagnosis not present

## 2021-08-16 DIAGNOSIS — R7303 Prediabetes: Secondary | ICD-10-CM

## 2021-08-16 NOTE — Progress Notes (Signed)
Chief Complaint:   OBESITY Natalie Hall is here to discuss her progress with her obesity treatment plan along with follow-up of her obesity related diagnoses. Natalie Hall is on the Category 2 Plan and states she is following her eating plan approximately 40% of the time. Natalie Hall states she is doing Chief of Staff for 60 minutes 3 times per week.  Today's visit was #: 18 Starting weight: 213 lbs Starting date: 08/04/2018 Today's weight: 183 lbs Today's date: 08/16/2021 Total lbs lost to date: 30 lbs Total lbs lost since last in-office visit: 0  Interim History: Natalie Hall recently returned from vacation.  She did not eat on plan while on her trip.  She is planning to have right shoulder replacement in January.    She had a colonoscopy last week.  Denies excessive hunger.  She would like to lose 5 pounds before her shoulder surgery. She is up 5 pounds since March 2022.  Subjective:   1. Pre-diabetes Last A1c was 5.9.  On metformin daily. She didn't tolerate Ozempic well.   Assessment/Plan:   1. Pre-diabetes Continue metformin.  2. Obesity: Current BMI 31.4  Natalie Hall is currently in the action stage of change. As such, her goal is to continue with weight loss efforts. She has agreed to the Category 2 Plan.   Exercise goals:  As is.  Behavioral modification strategies: increasing lean protein intake and decreasing simple carbohydrates.  [facility] has agreed to follow-up with our clinic in 4 weeks.  Objective:   Blood pressure 132/76, pulse 61, temperature 98 F (36.7 C), height 5\' 4"  (1.626 m), weight 183 lb (83 kg), SpO2 98 %. Body mass index is 31.41 kg/m.  General: Cooperative, alert, well developed, in no acute distress. HEENT: Conjunctivae and lids unremarkable. Cardiovascular: Regular rhythm.  Lungs: Normal work of breathing. Neurologic: No focal deficits.   Lab Results  Component Value Date   CREATININE 0.63 04/13/2021   BUN 20 04/13/2021   NA 138 04/13/2021   K 4.6  04/13/2021   CL 103 04/13/2021   CO2 28 04/13/2021   Lab Results  Component Value Date   ALT 22 04/13/2021   AST 24 04/13/2021   ALKPHOS 36 (L) 04/13/2021   BILITOT 0.5 04/13/2021   Lab Results  Component Value Date   HGBA1C 5.9 04/13/2021   HGBA1C 5.5 12/06/2020   HGBA1C 6.0 03/31/2020   HGBA1C 5.6 11/17/2019   HGBA1C 6.0 03/31/2019   Lab Results  Component Value Date   INSULIN 16.6 12/06/2020   INSULIN 18.5 11/17/2019   INSULIN 15.4 11/20/2018   INSULIN 33.5 (H) 08/04/2018   Lab Results  Component Value Date   TSH 1.31 04/13/2021   Lab Results  Component Value Date   CHOL 111 04/13/2021   HDL 28.80 (L) 04/13/2021   LDLCALC 50 04/13/2021   LDLDIRECT 96.0 03/11/2018   TRIG 164.0 (H) 04/13/2021   CHOLHDL 4 04/13/2021   Lab Results  Component Value Date   VD25OH 51.1 12/06/2020   VD25OH 105.10 (HH) 03/31/2020   VD25OH 69.1 11/17/2019   Lab Results  Component Value Date   WBC 4.9 04/13/2021   HGB 13.0 04/13/2021   HCT 38.5 04/13/2021   MCV 87.4 04/13/2021   PLT 186.0 04/13/2021   Attestation Statements:   Reviewed by clinician on day of visit: allergies, medications, problem list, medical history, surgical history, family history, social history, and previous encounter notes.  I, Water quality scientist, CMA, am acting as Location manager for [employer], [place].  I have  reviewed the above documentation for accuracy and completeness, and I agree with the above. -  Georgianne Fick, FNP

## 2021-08-16 NOTE — Telephone Encounter (Signed)
  Follow up Call-  Call back number 08/14/2021  Post procedure Call Back phone  # [phone]  Permission to leave phone message Yes  Some recent data might be hidden     Patient questions:  Do you have a fever, pain , or abdominal swelling? No. Pain Score  0 *  Have you tolerated food without any problems? Yes.    Have you been able to return to your normal activities? Yes.    Do you have any questions about your discharge instructions: Diet   No. Medications  No. Follow up visit  No.  Do you have questions or concerns about your Care? No.  Actions: * If pain score is 4 or above: No action needed, pain <4.  Have you developed a fever since your procedure? no  2.   Have you had an respiratory symptoms (SOB or cough) since your procedure? no  3.   Have you tested positive for COVID 19 since your procedure no  4.   Have you had any family members/close contacts diagnosed with the COVID 19 since your procedure?  no   If yes to any of these questions please route to Joylene John, RN and Joella Prince, RN

## 2021-08-18 ENCOUNTER — Encounter: Payer: Self-pay | Admitting: Gastroenterology

## 2021-08-22 ENCOUNTER — Encounter: Payer: Self-pay | Admitting: Family Medicine

## 2021-08-23 ENCOUNTER — Other Ambulatory Visit: Payer: Self-pay | Admitting: Adult Health

## 2021-08-23 MED ORDER — TEMAZEPAM 15 MG PO CAPS: ORAL_CAPSULE | ORAL | 0 refills | Status: DC

## 2021-09-05 ENCOUNTER — Encounter: Payer: Self-pay | Admitting: Family Medicine

## 2021-09-05 NOTE — Telephone Encounter (Signed)
Call in a 4 mg Medrol dose pack, 21 tablets

## 2021-09-06 ENCOUNTER — Other Ambulatory Visit: Payer: Self-pay

## 2021-09-06 MED ORDER — METHYLPREDNISOLONE 4 MG PO TBPK: ORAL_TABLET | ORAL | 0 refills | Status: DC

## 2021-09-11 ENCOUNTER — Other Ambulatory Visit (HOSPITAL_COMMUNITY): Payer: Self-pay | Admitting: Urology

## 2021-09-11 ENCOUNTER — Ambulatory Visit (INDEPENDENT_AMBULATORY_CARE_PROVIDER_SITE_OTHER): Payer: Medicare Other | Admitting: Family Medicine

## 2021-09-11 ENCOUNTER — Other Ambulatory Visit: Payer: Self-pay | Admitting: Urology

## 2021-09-11 ENCOUNTER — Other Ambulatory Visit: Payer: Self-pay

## 2021-09-11 ENCOUNTER — Encounter (INDEPENDENT_AMBULATORY_CARE_PROVIDER_SITE_OTHER): Payer: Self-pay | Admitting: Family Medicine

## 2021-09-11 VITALS — BP 140/70 | HR 75 | Temp 97.9°F | Wt 184.5 lb

## 2021-09-11 VITALS — BP 113/69 | HR 64 | Temp 97.7°F | Ht 64.0 in | Wt 180.0 lb

## 2021-09-11 DIAGNOSIS — R310 Gross hematuria: Secondary | ICD-10-CM

## 2021-09-11 DIAGNOSIS — R233 Spontaneous ecchymoses: Secondary | ICD-10-CM

## 2021-09-11 DIAGNOSIS — Z6841 Body Mass Index (BMI) 40.0 and over, adult: Secondary | ICD-10-CM | POA: Diagnosis not present

## 2021-09-11 DIAGNOSIS — R7303 Prediabetes: Secondary | ICD-10-CM | POA: Diagnosis not present

## 2021-09-11 DIAGNOSIS — R21 Rash and other nonspecific skin eruption: Secondary | ICD-10-CM

## 2021-09-11 LAB — CBC WITH DIFFERENTIAL/PLATELET
Basophils Absolute: 0 10*3/uL (ref 0.0–0.1)
Basophils Relative: 0.4 % (ref 0.0–3.0)
Eosinophils Absolute: 0.4 10*3/uL (ref 0.0–0.7)
Eosinophils Relative: 5.9 % — ABNORMAL HIGH (ref 0.0–5.0)
HCT: 40.4 % (ref 36.0–46.0)
Hemoglobin: 13.6 g/dL (ref 12.0–15.0)
Lymphocytes Relative: 25.7 % (ref 12.0–46.0)
Lymphs Abs: 1.6 10*3/uL (ref 0.7–4.0)
MCHC: 33.6 g/dL (ref 30.0–36.0)
MCV: 86.7 fl (ref 78.0–100.0)
Monocytes Absolute: 0.5 10*3/uL (ref 0.1–1.0)
Monocytes Relative: 7.4 % (ref 3.0–12.0)
Neutro Abs: 3.9 10*3/uL (ref 1.4–7.7)
Neutrophils Relative %: 60.6 % (ref 43.0–77.0)
Platelets: 208 10*3/uL (ref 150.0–400.0)
RBC: 4.66 Mil/uL (ref 3.87–5.11)
RDW: 14 % (ref 11.5–15.5)
WBC: 6.4 10*3/uL (ref 4.0–10.5)

## 2021-09-11 LAB — APTT: aPTT: 29.6 s (ref 23.4–32.7)

## 2021-09-11 LAB — PROTIME-INR
INR: 1 ratio (ref 0.8–1.0)
Prothrombin Time: 11.5 s (ref 9.6–13.1)

## 2021-09-11 LAB — SEDIMENTATION RATE: Sed Rate: 30 mm/hr (ref 0–30)

## 2021-09-11 MED ORDER — METHYLPREDNISOLONE ACETATE 80 MG/ML IJ SUSP
80.0000 mg | Freq: Once | INTRAMUSCULAR | Status: AC
  Administered 2021-09-11: 80 mg via INTRAMUSCULAR

## 2021-09-11 NOTE — Patient Instructions (Signed)
Petechiae (petechial rash)      many potential causes      Watch for any further bruising or other signs of bleeding such as nosebleed, gum bleeding, etc.

## 2021-09-11 NOTE — Progress Notes (Signed)
Chief Complaint:   OBESITY Natalie Hall is here to discuss her progress with her obesity treatment plan along with follow-up of her obesity related diagnoses. Natalie Hall is on the Category 2 Plan and states she is following her eating plan approximately 90% of the time. Natalie Hall states she is doing 0 minutes 0 times per week.  Today's visit was #: 44 Starting weight: 213 lbs Starting date: 08/04/2018 Today's weight: 180 lbs Today's date: 09/11/2021 Total lbs lost to date: 33 Total lbs lost since last in-office visit: 3  Interim History: Natalie Hall has been adhering well to the plan over the past month. She has been too busy for exercise over the past month. She will have right shoulder surgery the first of January. She feels she will not be able to get back to exercising until after her shoulder surgery.  Subjective:   1. Pre-diabetes Natalie Hall's appetite is well controlled on metformin a daily.  Lab Results  Component Value Date   HGBA1C 5.9 04/13/2021   Lab Results  Component Value Date   INSULIN 16.6 12/06/2020   INSULIN 18.5 11/17/2019   INSULIN 15.4 11/20/2018   INSULIN 33.5 (H) 08/04/2018   Assessment/Plan:   1. Pre-diabetes Natalie Hall will continue metformin.  2. Obesity: Current BMI 30.88 Natalie Hall is currently in the action stage of change. As such, her goal is to continue with weight loss efforts. She has agreed to the Category 2 Plan.   Exercise goals: All adults should avoid inactivity. Some physical activity is better than none, and adults who participate in any amount of physical activity gain some health benefits.  Behavioral modification strategies: increasing lean protein intake and decreasing simple carbohydrates.  Natalie Hall has agreed to follow-up with our clinic in 4 to 6 weeks.  Objective:   Blood pressure 113/69, pulse 64, temperature 97.7 F (36.5 C), height 5\' 4"  (1.626 m), weight 180 lb (81.6 kg), SpO2 99 %. Body mass index is 30.9 kg/m.  General: Cooperative,  alert, well developed, in no acute distress. HEENT: Conjunctivae and lids unremarkable. Cardiovascular: Regular rhythm.  Lungs: Normal work of breathing. Neurologic: No focal deficits.   Lab Results  Component Value Date   CREATININE 0.63 04/13/2021   BUN 20 04/13/2021   NA 138 04/13/2021   K 4.6 04/13/2021   CL 103 04/13/2021   CO2 28 04/13/2021   Lab Results  Component Value Date   ALT 22 04/13/2021   AST 24 04/13/2021   ALKPHOS 36 (L) 04/13/2021   BILITOT 0.5 04/13/2021   Lab Results  Component Value Date   HGBA1C 5.9 04/13/2021   HGBA1C 5.5 12/06/2020   HGBA1C 6.0 03/31/2020   HGBA1C 5.6 11/17/2019   HGBA1C 6.0 03/31/2019   Lab Results  Component Value Date   INSULIN 16.6 12/06/2020   INSULIN 18.5 11/17/2019   INSULIN 15.4 11/20/2018   INSULIN 33.5 (H) 08/04/2018   Lab Results  Component Value Date   TSH 1.31 04/13/2021   Lab Results  Component Value Date   CHOL 111 04/13/2021   HDL 28.80 (L) 04/13/2021   LDLCALC 50 04/13/2021   LDLDIRECT 96.0 03/11/2018   TRIG 164.0 (H) 04/13/2021   CHOLHDL 4 04/13/2021   Lab Results  Component Value Date   VD25OH 51.1 12/06/2020   VD25OH 105.10 (HH) 03/31/2020   VD25OH 69.1 11/17/2019   Lab Results  Component Value Date   WBC 4.9 04/13/2021   HGB 13.0 04/13/2021   HCT 38.5 04/13/2021   MCV 87.4 04/13/2021  PLT 186.0 04/13/2021   No results found for: IRON, TIBC, FERRITIN  Attestation Statements:   Reviewed by clinician on day of visit: allergies, medications, problem list, medical history, surgical history, family history, social history, and previous encounter notes.   Natalie Hall, am acting as Location manager for [employer], FNP-C.  I have reviewed the above documentation for accuracy and completeness, and I agree with the above. -  Natalie Fick, FNP

## 2021-09-11 NOTE — Progress Notes (Signed)
Established Patient Office Visit  Subjective:  Patient ID: Natalie Hall, female    DOB: 23-Apr-1951  Age: 70 y.o. MRN: [id number]  CC:  Chief Complaint  Patient presents with   Follow-up    Rash started on legs, has finished prednisone, rash is not spreading up to the stomach and starting on the arms    HPI Natalie Hall presents for skin rash lower extremities progressing upward.  She states she has had similar rash in the past which seems to be somewhat seasonal.  Has responded to oral steroids in the past.  She had some oral prednisone called in last week but rash has not improved.  She does have some itching.  She has petechial rash lower extremities now involving upper thighs.  She had cystoscopy last week and does not recall any bleeding from that.  She does have a fairly large bruise left pelvic region as well with no associated pain there.  Denies any recent fever.  No new medications.  No recent antibiotics.  No abdominal pain.  No diarrhea.  No significant arthralgias.  No aspirin use.  She has history of CAD, hypertension, asthma, GERD, diabetes, hyperlipidemia  She states she had a brother who years ago had ITP.  Patient denies any recent nosebleeds, gum bleed, bloody stools, hematuria, etc.  Past Medical History:  Diagnosis Date   Asthma    infrequent problem - no treatment since 2006   Back pain    Choroidal nevus of left eye    sees Dr. Gerarda Fraction at [place].    Constipation    Coronary artery disease    Dysrhythmia    SVT - ablation by Dr. Curt Bears on 05/21/2016 and episodes since   Fatty liver    GERD (gastroesophageal reflux disease)    History of hiatal hernia    "very small"   Hx of colonic polyp    Hyperlipidemia    Hypertension    Joint pain    Melanoma of eye, left ([place])    sees Dr. Cordelia Pen   Myocardial infarction [facility])    Osteoarthritis    Osteoporosis    osteopenia   Shortness of breath    Supraventricular tachycardia ([place])     Past  Surgical History:  Procedure Laterality Date   ABDOMINAL HYSTERECTOMY     ACHILLES TENDON REPAIR  10-07-11   torn on right, per Dr. Marge Duncans SURGERY     torn tendon left ankle   BREAST BIOPSY     CARDIAC CATHETERIZATION N/A 03/14/2016   Procedure: Left Heart Cath and Coronary Angiography;  Surgeon: Peter M Martinique, MD;  Location: [place] [facility];  Service: Cardiovascular;  Laterality: N/A;   CARDIAC CATHETERIZATION N/A 03/14/2016   Procedure: Coronary Stent Intervention;  Surgeon: Peter M Martinique, MD;  Location: [place] [facility];  Service: Cardiovascular;  Laterality: N/A;   CARPAL TUNNEL RELEASE     CATARACT EXTRACTION W/ INTRAOCULAR LENS IMPLANT Bilateral    CHOLECYSTECTOMY     COLONOSCOPY  06/23/2014   per Dr. Ardis Hughs, adenomatous polyps, repeat in 7 yrs     ELECTROPHYSIOLOGIC STUDY N/A 05/21/2016   Procedure: SVT Ablation;  Surgeon: [name], MD;  Location: [place] [facility];  Service: Cardiovascular;  Laterality: N/A;   EXCISION HAGLUND'S DEFORMITY WITH ACHILLES TENDON REPAIR Left 07/02/2019   Procedure: Left Achilles tendon debridement and reconstruction and excision of Haglund deformity;  Surgeon: Wylene Simmer, MD;  Location: [facility]  [place];  Service: Orthopedics;  Laterality: Left;   EYE SURGERY Left 08/2016   GASTROCNEMIUS RECESSION Left 07/02/2019   Procedure: Gastroc recession;  Surgeon: Wylene Simmer, MD;  Location: [place];  Service: Orthopedics;  Laterality: Left;   IR KYPHO THORACIC WITH BONE BIOPSY  12/18/2018   IR RADIOLOGIST EVAL & MGMT  12/09/2018   IR RADIOLOGIST EVAL & MGMT  04/30/2019   KNEE CLOSED REDUCTION Left 02/03/2014   Procedure: CLOSED MANIPULATION LEFT KNEE;  Surgeon: Gearlean Alf, MD;  Location: [facility];  Service: Orthopedics;  Laterality: Left;   POLYPECTOMY     REVERSE SHOULDER ARTHROPLASTY     Left   REVERSE SHOULDER ARTHROPLASTY Left 06/28/2017   Procedure: LEFT SHOULDER REVERSE SHOULDER ARTHROPLASTY;   Surgeon: Netta Cedars, MD;  Location: [place];  Service: Orthopedics;  Laterality: Left;   TOTAL KNEE ARTHROPLASTY Left 12/21/2013   Procedure: LEFT TOTAL KNEE ARTHROPLASTY;  Surgeon: Gearlean Alf, MD;  Location: [facility];  Service: Orthopedics;  Laterality: Left;   VARICOSE VEIN SURGERY      Family History  Problem Relation Age of Onset   Colon polyps Mother    Diabetes Mother    High blood pressure Mother    High Cholesterol Mother    Thyroid disease Mother    Anxiety disorder Mother    Colon cancer Maternal Aunt    Liver cancer Maternal Aunt    Breast cancer Other    Coronary artery disease Other    Colon cancer Other    Diabetes Other    Hypertension Other    Kidney disease Other    Lung cancer Other    Lung cancer Brother        spleen, bone   Diabetes Father    High blood pressure Father    High Cholesterol Father    Heart disease Father    Depression Father    Obesity Father    Esophageal cancer Neg Hx    Stomach cancer Neg Hx    Rectal cancer Neg Hx     Social History   Socioeconomic History   Marital status: Married    Spouse name: Runner, broadcasting/film/video   Number of children: Not on file   Years of education: Not on file   Highest education level: Not on file  Occupational History   Occupation: retired  Tobacco Use   Smoking status: Never   Smokeless tobacco: Never  Vaping Use   Vaping Use: Never used  Substance and Sexual Activity   Alcohol use: No    Alcohol/week: 0.0 standard drinks   Drug use: No   Sexual activity: Not on file  Other Topics Concern   Not on file  Social History Narrative   Not on file   Social Determinants of Health   Financial Resource Strain: Low Risk    Difficulty of Paying Living Expenses: Not hard at all  Food Insecurity: No Food Insecurity   Worried About Charity fundraiser in the Last Year: Never true   Ran Out of Food in the Last Year: Never true  Transportation Needs: No Transportation Needs   Lack of Transportation  (Medical): No   Lack of Transportation (Non-Medical): No  Physical Activity: Sufficiently Active   Days of Exercise per Week: 3 days   Minutes of Exercise per Session: 60 min  Stress: No Stress Concern Present   Feeling of Stress : Not at all  Social Connections: Socially Integrated   Frequency of  Communication with Friends and Family: More than three times a week   Frequency of Social Gatherings with Friends and Family: More than three times a week   Attends Religious Services: More than 4 times per year   Active Member of [employer] or Organizations: Yes   Attends Music therapist: More than 4 times per year   Marital Status: Married  Human resources officer Violence: Not At Risk   Fear of Current or Ex-Partner: No   Emotionally Abused: No   Physically Abused: No   Sexually Abused: No    Outpatient Medications Prior to Visit  Medication Sig Dispense Refill   acetaminophen (TYLENOL) 500 MG tablet Take 500 mg by mouth every 6 (six) hours as needed (pain).      aspirin EC 81 MG tablet Take 81 mg by mouth daily.     Calcium Carb-Cholecalciferol 600-800 MG-UNIT TABS Take 1 tablet by mouth daily.      cetirizine (ZYRTEC) 10 MG tablet Take 10 mg by mouth daily as needed for allergies.     Cholecalciferol (VITAMIN D) 50 MCG (2000 UT) CAPS Take 1 capsule (2,000 Units total) by mouth daily. 30 capsule 0   ezetimibe (ZETIA) 10 MG tablet TAKE 1 TABLET BY MOUTH  DAILY 90 tablet 3   fluticasone (FLONASE) 50 MCG/ACT nasal spray Place into both nostrils daily.     metFORMIN (GLUCOPHAGE) 500 MG tablet Take 1 tablet (500 mg total) by mouth daily with breakfast. 90 tablet 0   methylPREDNISolone (MEDROL DOSEPAK) 4 MG TBPK tablet Take as directed 21 tablet 0   metoprolol succinate (TOPROL-XL) 25 MG 24 hr tablet TAKE 1 TABLET BY MOUTH  TWICE DAILY 180 tablet 3   Multiple Vitamins-Minerals (MULTIVITAL PO) Take by mouth.     omeprazole (PRILOSEC) 40 MG capsule TAKE 1 CAPSULE BY MOUTH  DAILY 90 capsule 3    ramipril (ALTACE) 10 MG capsule TAKE 1 CAPSULE BY MOUTH  DAILY 90 capsule 3   rosuvastatin (CRESTOR) 40 MG tablet TAKE 1 TABLET BY MOUTH  DAILY 90 tablet 3   temazepam (RESTORIL) 15 MG capsule TAKE 1 CAPSULE BY MOUTH  EVERY NIGHT AT BEDTIME AS  NEEDED FOR SLEEP 30 capsule 0   No facility-administered medications prior to visit.    Allergies  Allergen Reactions   Codeine Other (See Comments), Shortness Of Breath and Rash    flushing flushing    Levofloxacin In D5w Other (See Comments)    Leg pain  Leg pain    Amoxicillin-Pot Clavulanate Rash     Has patient had a PCN reaction causing immediate rash, facial/tongue/throat swelling, SOB or lightheadedness with hypotension: No Has patient had a PCN reaction causing severe rash involving mucus membranes or skin necrosis: No Has patient had a PCN reaction that required hospitalization: No Has patient had a PCN reaction occurring within the last 10 years: Yes If all of the above answers are "NO", then may proceed with Cephalosporin use.   Escitalopram Oxalate Rash    ROS Review of Systems  Constitutional:  Negative for appetite change, fever and unexpected weight change.  Respiratory:  Negative for shortness of breath.   Cardiovascular:  Negative for chest pain.  Gastrointestinal:  Negative for abdominal pain, diarrhea, nausea and vomiting.  Genitourinary:  Negative for dysuria and hematuria.  Skin:  Positive for rash.  Neurological:  Negative for headaches.  Hematological:  Negative for adenopathy.     Objective:    Physical Exam Vitals reviewed.  Constitutional:  Appearance: Normal appearance.  Cardiovascular:     Rate and Rhythm: Normal rate and regular rhythm.  Pulmonary:     Effort: Pulmonary effort is normal.     Breath sounds: Normal breath sounds.  Musculoskeletal:     Right lower leg: No edema.     Left lower leg: No edema.  Skin:    Comments: She has several nonblanching nonscaly petechial type lesions  lower legs up to the upper thigh bilaterally.  No scaly rash.  No vesicles.  No pustules.  She has 1 visible bruise left pelvic area which is about 3 x 4 cm.  No hematoma.  Neurological:     Mental Status: She is alert.    BP 140/70 (BP Location: Left Arm, Patient Position: Sitting, Cuff Size: Normal)   Pulse 75   Temp 97.9 F (36.6 C) (Oral)   Wt 184 lb 8 oz (83.7 kg)   LMP  (LMP Unknown)   SpO2 98%   BMI 31.67 kg/m  Wt Readings from Last 3 Encounters:  09/11/21 184 lb 8 oz (83.7 kg)  09/11/21 180 lb (81.6 kg)  08/16/21 183 lb (83 kg)     Health Maintenance Due  Topic Date Due   Pneumonia Vaccine 58+ Years old (55 - PPSV23 if available, else PCV20) 10/22/2016    There are no preventive care reminders to display for this patient.  Lab Results  Component Value Date   TSH 1.31 04/13/2021   Lab Results  Component Value Date   WBC 4.9 04/13/2021   HGB 13.0 04/13/2021   HCT 38.5 04/13/2021   MCV 87.4 04/13/2021   PLT 186.0 04/13/2021   Lab Results  Component Value Date   NA 138 04/13/2021   K 4.6 04/13/2021   CO2 28 04/13/2021   GLUCOSE 85 04/13/2021   BUN 20 04/13/2021   CREATININE 0.63 04/13/2021   BILITOT 0.5 04/13/2021   ALKPHOS 36 (L) 04/13/2021   AST 24 04/13/2021   ALT 22 04/13/2021   PROT 7.5 04/13/2021   ALBUMIN 4.2 04/13/2021   CALCIUM 9.6 04/13/2021   ANIONGAP 8 12/18/2018   GFR 89.56 04/13/2021   Lab Results  Component Value Date   CHOL 111 04/13/2021   Lab Results  Component Value Date   HDL 28.80 (L) 04/13/2021   Lab Results  Component Value Date   LDLCALC 50 04/13/2021   Lab Results  Component Value Date   TRIG 164.0 (H) 04/13/2021   Lab Results  Component Value Date   CHOLHDL 4 04/13/2021   Lab Results  Component Value Date   HGBA1C 5.9 04/13/2021      Assessment & Plan:   Problem List Items Addressed This Visit   None Visit Diagnoses     Petechiae    -  Primary   Relevant Orders   CBC with Differential/Platelet    Protime-INR   APTT   Sedimentation rate     Patient presents with petechial type rash lower extremities with significant associated pruritus.  Patient afebrile and nontoxic in appearance.  She states she has had similar rash in the past was generally responsive to steroids.  No other bleeding complications.  Recommend the following  -Check labs with CBC, PT, PTT, sed rate. -She has responded to steroids in the past though not with recent Medrol.  We agreed to try Depo-Medrol 80 mg IM pending labs.  Watch blood sugars closely.  She is aware this will likely elevate her blood sugars temporarily.  She is  encouraged to monitor this -Follow-up immediately for any gross hematuria, bloody stools, nosebleeds, gum bleeds, or any progressive petechiae or bruising  No orders of the defined types were placed in this encounter.   Follow-up: No follow-ups on file.    Carolann Littler, MD

## 2021-09-12 ENCOUNTER — Telehealth: Payer: Self-pay | Admitting: *Deleted

## 2021-09-12 ENCOUNTER — Encounter (INDEPENDENT_AMBULATORY_CARE_PROVIDER_SITE_OTHER): Payer: Self-pay | Admitting: Family Medicine

## 2021-09-12 NOTE — Telephone Encounter (Signed)
   Name: Natalie Hall  DOB: 1951/07/04  MRN: [id number]  Primary Cardiologist: Pixie Casino, MD  Chart reviewed as part of pre-operative protocol coverage. Because of DREAMER CARILLO past medical history and time since last visit, she will require a follow-up visit in order to better assess preoperative cardiovascular risk.  Pre-op covering staff: - Please schedule appointment and call patient to inform them. If patient already had an upcoming appointment within acceptable timeframe, please add "pre-op clearance" to the appointment notes so provider is aware. - Please contact requesting surgeon's office via preferred method (i.e, phone, fax) to inform them of need for appointment prior to surgery.  Patient is being asked to hold Aspirin. She was previously given the OK to hold Aspirin for 7 days for a surgery in 2020. She has had no new cardiac events since that time so same recommended can be used.   Darreld Mclean, PA-C  09/12/2021, 6:49 PM

## 2021-09-12 NOTE — Telephone Encounter (Signed)
   [facility] Pre-operative Risk Assessment    Patient Name: Natalie Hall  DOB: 11-04-1950 MRN: [id number]  HEARTCARE STAFF:  - IMPORTANT!!!!!! Under Visit Info/Reason for Call, type in Other and utilize the format Clearance MM/DD/YY or Clearance TBD. Do not use dashes or single digits. - Please review there is not already an duplicate clearance open for this procedure. - If request is for dental extraction, please clarify the # of teeth to be extracted. - If the patient is currently at the dentist's office, call Pre-Op Callback Staff (MA/nurse) to input urgent request.  - If the patient is not currently in the dentist office, please route to the Pre-Op pool.  Request for surgical clearance:  What type of surgery is being performed? Right reverse total shoulder  When is this surgery scheduled? 11/03/2021  What type of clearance is required (medical clearance vs. Pharmacy clearance to hold med vs. Both)? both  Are there any medications that need to be held prior to surgery and how long? Aspirin-need direction  Practice name and name of physician performing surgery? [facility]  What is the office phone number? [phone]   7.   What is the office fax number? [phone]  8.   Anesthesia type (None, local, MAC, general) ? general   Fredia Beets 09/12/2021, 3:44 PM  _________________________________________________________________   (provider comments below)

## 2021-09-13 NOTE — Telephone Encounter (Signed)
Contacted patient and scheduled follow up appointment on 10/09/21 at 8am with Natalie Montana, NP at the [facility] location.    Patient voiced understanding.

## 2021-09-16 ENCOUNTER — Ambulatory Visit (HOSPITAL_COMMUNITY)
Admission: RE | Admit: 2021-09-16 | Discharge: 2021-09-16 | Disposition: A | Discharge status: Home / Self Care | Payer: Medicare Other | Source: Ambulatory Visit | Attending: Urology | Admitting: Urology

## 2021-09-16 DIAGNOSIS — R310 Gross hematuria: Secondary | ICD-10-CM | POA: Diagnosis present

## 2021-09-16 MED ORDER — GADOBUTROL 1 MMOL/ML IV SOLN
8.0000 mL | Freq: Once | INTRAVENOUS | Status: AC | PRN
  Administered 2021-09-16: 8 mL via INTRAVENOUS

## 2021-09-18 ENCOUNTER — Ambulatory Visit (INDEPENDENT_AMBULATORY_CARE_PROVIDER_SITE_OTHER): Payer: Medicare Other | Admitting: Family Medicine

## 2021-09-18 VITALS — BP 120/66 | HR 64 | Temp 97.8°F | Wt 186.3 lb

## 2021-09-18 DIAGNOSIS — R233 Spontaneous ecchymoses: Secondary | ICD-10-CM

## 2021-09-18 MED ORDER — PREDNISONE 10 MG PO TABS: ORAL_TABLET | ORAL | 0 refills | Status: DC

## 2021-09-18 NOTE — Progress Notes (Signed)
Established Patient Office Visit  Subjective:  Patient ID: Natalie Hall, female    DOB: 05/06/1951  Age: 70 y.o. MRN: [id number]  CC:  Chief Complaint  Patient presents with   Follow-up    Rash, some improvement but not gone    HPI Natalie Hall presents for follow-up regarding petechial rash lower extremities.  She was seen here on 21 November.  She had already received Medrol Dosepak without much improvement.  We did some labs including sed rate, CBC, INR, PTT which were all basically normal.  She was given Depo-Medrol 80 mg IM as she seems to responded well to steroids in the past.  She states that the rash has faded slightly but is still not gone.  She denies any arthralgias.  No fever.  No upper extremity petechiae.  She had some initially around her waist area and these have resolved.  She states this seems to be seasonal and usually gets this once a year about this time a year.  No recent urticaria.  No pruritus.  Past Medical History:  Diagnosis Date   Asthma    infrequent problem - no treatment since 2006   Back pain    Choroidal nevus of left eye    sees Dr. Gerarda Fraction at [facility].    Constipation    Coronary artery disease    Dysrhythmia    SVT - ablation by Dr. Curt Bears on 05/21/2016 and episodes since   Fatty liver    GERD (gastroesophageal reflux disease)    History of hiatal hernia    "very small"   Hx of colonic polyp    Hyperlipidemia    Hypertension    Joint pain    Melanoma of eye, left ([place])    sees Dr. Cordelia Pen   Myocardial infarction [facility])    Osteoarthritis    Osteoporosis    osteopenia   Shortness of breath    Supraventricular tachycardia ([place])     Past Surgical History:  Procedure Laterality Date   ABDOMINAL HYSTERECTOMY     ACHILLES TENDON REPAIR  10-07-11   torn on right, per Dr. Marge Duncans SURGERY     torn tendon left ankle   BREAST BIOPSY     CARDIAC CATHETERIZATION N/A 03/14/2016   Procedure: Left Heart Cath and Coronary  Angiography;  Surgeon: Peter M Martinique, MD;  Location: [place] [facility];  Service: Cardiovascular;  Laterality: N/A;   CARDIAC CATHETERIZATION N/A 03/14/2016   Procedure: Coronary Stent Intervention;  Surgeon: Peter M Martinique, MD;  Location: [place] [facility];  Service: Cardiovascular;  Laterality: N/A;   CARPAL TUNNEL RELEASE     CATARACT EXTRACTION W/ INTRAOCULAR LENS IMPLANT Bilateral    CHOLECYSTECTOMY     COLONOSCOPY  06/23/2014   per Dr. Ardis Hughs, adenomatous polyps, repeat in 7 yrs     ELECTROPHYSIOLOGIC STUDY N/A 05/21/2016   Procedure: SVT Ablation;  Surgeon: [name], MD;  Location: [place] [facility];  Service: Cardiovascular;  Laterality: N/A;   EXCISION HAGLUND'S DEFORMITY WITH ACHILLES TENDON REPAIR Left 07/02/2019   Procedure: Left Achilles tendon debridement and reconstruction and excision of Haglund deformity;  Surgeon: Wylene Simmer, MD;  Location: [place];  Service: Orthopedics;  Laterality: Left;   EYE SURGERY Left 08/2016   GASTROCNEMIUS RECESSION Left 07/02/2019   Procedure: Gastroc recession;  Surgeon: Wylene Simmer, MD;  Location: [place];  Service: Orthopedics;  Laterality: Left;   IR KYPHO  THORACIC WITH BONE BIOPSY  12/18/2018   IR RADIOLOGIST EVAL & MGMT  12/09/2018   IR RADIOLOGIST EVAL & MGMT  04/30/2019   KNEE CLOSED REDUCTION Left 02/03/2014   Procedure: CLOSED MANIPULATION LEFT KNEE;  Surgeon: Gearlean Alf, MD;  Location: [facility];  Service: Orthopedics;  Laterality: Left;   POLYPECTOMY     REVERSE SHOULDER ARTHROPLASTY     Left   REVERSE SHOULDER ARTHROPLASTY Left 06/28/2017   Procedure: LEFT SHOULDER REVERSE SHOULDER ARTHROPLASTY;  Surgeon: Netta Cedars, MD;  Location: [place];  Service: Orthopedics;  Laterality: Left;   TOTAL KNEE ARTHROPLASTY Left 12/21/2013   Procedure: LEFT TOTAL KNEE ARTHROPLASTY;  Surgeon: Gearlean Alf, MD;  Location: [facility];  Service: Orthopedics;  Laterality: Left;   VARICOSE VEIN SURGERY       Family History  Problem Relation Age of Onset   Colon polyps Mother    Diabetes Mother    High blood pressure Mother    High Cholesterol Mother    Thyroid disease Mother    Anxiety disorder Mother    Colon cancer Maternal Aunt    Liver cancer Maternal Aunt    Breast cancer Other    Coronary artery disease Other    Colon cancer Other    Diabetes Other    Hypertension Other    Kidney disease Other    Lung cancer Other    Lung cancer Brother        spleen, bone   Diabetes Father    High blood pressure Father    High Cholesterol Father    Heart disease Father    Depression Father    Obesity Father    Esophageal cancer Neg Hx    Stomach cancer Neg Hx    Rectal cancer Neg Hx     Social History   Socioeconomic History   Marital status: Married    Spouse name: Runner, broadcasting/film/video   Number of children: Not on file   Years of education: Not on file   Highest education level: Associate degree: occupational, Hotel manager, or vocational program  Occupational History   Occupation: retired  Tobacco Use   Smoking status: Never   Smokeless tobacco: Never  Vaping Use   Vaping Use: Never used  Substance and Sexual Activity   Alcohol use: No    Alcohol/week: 0.0 standard drinks   Drug use: No   Sexual activity: Not on file  Other Topics Concern   Not on file  Social History Narrative   Not on file   Social Determinants of Health   Financial Resource Strain: Low Risk    Difficulty of Paying Living Expenses: Not hard at all  Food Insecurity: No Food Insecurity   Worried About Charity fundraiser in the Last Year: Never true   Arboriculturist in the Last Year: Never true  Transportation Needs: Unmet Transportation Needs   Lack of Transportation (Medical): Yes   Lack of Transportation (Non-Medical): Patient refused  Physical Activity: Sufficiently Active   Days of Exercise per Week: 3 days   Minutes of Exercise per Session: 60 min  Stress: Stress Concern Present   Feeling of  Stress : To some extent  Social Connections: Unknown   Frequency of Communication with Friends and Family: Patient refused   Frequency of Social Gatherings with Friends and Family: Patient refused   Attends Religious Services: More than 4 times per year   Active Member of Clubs or Organizations: Patient refused   Attends [employer]  or Organization Meetings: More than 4 times per year   Marital Status: Married  Human resources officer Violence: Not At Risk   Fear of Current or Ex-Partner: No   Emotionally Abused: No   Physically Abused: No   Sexually Abused: No    Outpatient Medications Prior to Visit  Medication Sig Dispense Refill   acetaminophen (TYLENOL) 500 MG tablet Take 500 mg by mouth every 6 (six) hours as needed (pain).      aspirin EC 81 MG tablet Take 81 mg by mouth daily.     Calcium Carb-Cholecalciferol 600-800 MG-UNIT TABS Take 1 tablet by mouth daily.      cetirizine (ZYRTEC) 10 MG tablet Take 10 mg by mouth daily as needed for allergies.     Cholecalciferol (VITAMIN D) 50 MCG (2000 UT) CAPS Take 1 capsule (2,000 Units total) by mouth daily. 30 capsule 0   ezetimibe (ZETIA) 10 MG tablet TAKE 1 TABLET BY MOUTH  DAILY 90 tablet 3   fluticasone (FLONASE) 50 MCG/ACT nasal spray Place into both nostrils daily.     metFORMIN (GLUCOPHAGE) 500 MG tablet Take 1 tablet (500 mg total) by mouth daily with breakfast. 90 tablet 0   metoprolol succinate (TOPROL-XL) 25 MG 24 hr tablet TAKE 1 TABLET BY MOUTH  TWICE DAILY 180 tablet 3   Multiple Vitamins-Minerals (MULTIVITAL PO) Take by mouth.     omeprazole (PRILOSEC) 40 MG capsule TAKE 1 CAPSULE BY MOUTH  DAILY 90 capsule 3   ramipril (ALTACE) 10 MG capsule TAKE 1 CAPSULE BY MOUTH  DAILY 90 capsule 3   rosuvastatin (CRESTOR) 40 MG tablet TAKE 1 TABLET BY MOUTH  DAILY 90 tablet 3   temazepam (RESTORIL) 15 MG capsule TAKE 1 CAPSULE BY MOUTH  EVERY NIGHT AT BEDTIME AS  NEEDED FOR SLEEP 30 capsule 0   methylPREDNISolone (MEDROL DOSEPAK) 4 MG TBPK tablet  Take as directed 21 tablet 0   No facility-administered medications prior to visit.    Allergies  Allergen Reactions   Codeine Other (See Comments), Shortness Of Breath and Rash    flushing flushing    Levofloxacin In D5w Other (See Comments)    Leg pain  Leg pain    Amoxicillin-Pot Clavulanate Rash     Has patient had a PCN reaction causing immediate rash, facial/tongue/throat swelling, SOB or lightheadedness with hypotension: No Has patient had a PCN reaction causing severe rash involving mucus membranes or skin necrosis: No Has patient had a PCN reaction that required hospitalization: No Has patient had a PCN reaction occurring within the last 10 years: Yes If all of the above answers are "NO", then may proceed with Cephalosporin use.   Escitalopram Oxalate Rash    ROS Review of Systems  Constitutional:  Negative for chills and fever.  Respiratory:  Negative for cough and shortness of breath.   Skin:  Positive for rash.  Hematological:  Negative for adenopathy.     Objective:    Physical Exam Vitals reviewed.  Constitutional:      Appearance: Normal appearance.  Cardiovascular:     Rate and Rhythm: Normal rate and regular rhythm.  Pulmonary:     Effort: Pulmonary effort is normal.     Breath sounds: Normal breath sounds.  Skin:    Findings: Rash present.     Comments: Still has some scattered nonblanching petechiae to lower legs and feet although these seem to be less prominent than last visit.  No urticaria.  No scaly rash.  No pustules.  No vesicles.  Neurological:     Mental Status: She is alert.    BP 120/66 (BP Location: Left Arm, Patient Position: Sitting, Cuff Size: Normal)   Pulse 64   Temp 97.8 F (36.6 C) (Oral)   Wt 186 lb 4.8 oz (84.5 kg)   LMP  (LMP Unknown)   SpO2 98%   BMI 31.98 kg/m  Wt Readings from Last 3 Encounters:  09/18/21 186 lb 4.8 oz (84.5 kg)  09/11/21 184 lb 8 oz (83.7 kg)  09/11/21 180 lb (81.6 kg)     Health Maintenance  Due  Topic Date Due   Pneumonia Vaccine 71+ Years old (3 - PPSV23 if available, else PCV20) 10/22/2016    There are no preventive care reminders to display for this patient.  Lab Results  Component Value Date   TSH 1.31 04/13/2021   Lab Results  Component Value Date   WBC 6.4 09/11/2021   HGB 13.6 09/11/2021   HCT 40.4 09/11/2021   MCV 86.7 09/11/2021   PLT 208.0 09/11/2021   Lab Results  Component Value Date   NA 138 04/13/2021   K 4.6 04/13/2021   CO2 28 04/13/2021   GLUCOSE 85 04/13/2021   BUN 20 04/13/2021   CREATININE 0.63 04/13/2021   BILITOT 0.5 04/13/2021   ALKPHOS 36 (L) 04/13/2021   AST 24 04/13/2021   ALT 22 04/13/2021   PROT 7.5 04/13/2021   ALBUMIN 4.2 04/13/2021   CALCIUM 9.6 04/13/2021   ANIONGAP 8 12/18/2018   GFR 89.56 04/13/2021   Lab Results  Component Value Date   CHOL 111 04/13/2021   Lab Results  Component Value Date   HDL 28.80 (L) 04/13/2021   Lab Results  Component Value Date   LDLCALC 50 04/13/2021   Lab Results  Component Value Date   TRIG 164.0 (H) 04/13/2021   Lab Results  Component Value Date   CHOLHDL 4 04/13/2021   Lab Results  Component Value Date   HGBA1C 5.9 04/13/2021      Assessment & Plan:   Problem List Items Addressed This Visit   None Visit Diagnoses     Petechial rash    -  Primary     Recent onset petechial rash confined mostly lower extremities.  Overall improved somewhat following steroids.  Recent labs reassuring.  Patient states higher dose of prednisone has been the thing that seems to resolve this in the past.  We did agree to prednisone taper starting at 40 mg daily.  If not resolving after that we recommend either dermatology follow-up versus punch biopsy  Meds ordered this encounter  Medications   predniSONE (DELTASONE) 10 MG tablet    Sig: Taper as follows: 4-4-4-3-3-3-3-2-2-1-1    Dispense:  32 tablet    Refill:  0    Follow-up: No follow-ups on file.    Carolann Littler, MD

## 2021-09-18 NOTE — Patient Instructions (Signed)
Let me know if rash not clearing with the current treatment with prednisone.

## 2021-09-25 ENCOUNTER — Telehealth: Payer: Self-pay | Admitting: Family Medicine

## 2021-09-25 NOTE — Telephone Encounter (Addendum)
Patient calling in with respiratory symptoms: Shortness of breath, chest pain, palpitations or other red words send to Triage  Does the patient have a fever over 100, cough, congestion, sore throat, runny nose, lost of taste/smell within the last 5 days (please list symptoms that patient has)?  Congestion in sinuses x 1 week, feels pressure on head when laying down, ear pain  Have you tested for Covid in the last 5 days? No   If yes, was it positive []  OR negative [] ? If positive in the last 5 days, please schedule virtual visit now. If negative, schedule for an in person OV with the next available provider if PCP has no openings. Please also let patient know they will be tested again (follow the script below)  "you will have to arrive 38mins prior to your appt time to be Covid tested. Please park in back of office at the [facility] & call [phone] to let the staff know you have arrived. A staff member will meet you at your car to do a rapid covid test. Once the test has resulted you will be notified by phone of your results to determine if appt will remain an in person visit or be converted to a virtual/phone visit. If you arrive less than 44mins before your appt time, your visit will be automatically converted to virtual & any recommended testing will happen AFTER the visit."   [place]  If no availability for virtual visit in office,  please schedule another [place] office  If no availability at another [place] office, please instruct patient that they can schedule an evisit or virtual visit through their mychart account. Visits up to 8pm  patients can be seen in office 5 days after positive COVID test

## 2021-09-25 NOTE — Telephone Encounter (Signed)
See message.  Appointment with Dr. Elease Hashimoto on 09/27/21

## 2021-09-26 ENCOUNTER — Encounter (INDEPENDENT_AMBULATORY_CARE_PROVIDER_SITE_OTHER): Payer: Self-pay | Admitting: Family Medicine

## 2021-09-27 ENCOUNTER — Encounter: Payer: Self-pay | Admitting: Family Medicine

## 2021-09-27 ENCOUNTER — Ambulatory Visit (INDEPENDENT_AMBULATORY_CARE_PROVIDER_SITE_OTHER): Payer: Medicare Other | Admitting: Family Medicine

## 2021-09-27 VITALS — BP 140/60 | HR 76 | Temp 97.8°F | Ht 64.0 in | Wt 185.8 lb

## 2021-09-27 DIAGNOSIS — J019 Acute sinusitis, unspecified: Secondary | ICD-10-CM

## 2021-09-27 LAB — POC COVID19 BINAXNOW: SARS Coronavirus 2 Ag: NEGATIVE

## 2021-09-27 MED ORDER — DOXYCYCLINE HYCLATE 100 MG PO CAPS: 100.0000 mg | ORAL_CAPSULE | Freq: Two times a day (BID) | ORAL | 0 refills | Status: DC

## 2021-09-27 NOTE — Progress Notes (Signed)
Established Patient Office Visit  Subjective:  Patient ID: Natalie Hall, female    DOB: February 15, 1951  Age: 70 y.o. MRN: [id number]  CC:  Chief Complaint  Patient presents with   Sinusitis    Patient complains of sinusitis, x1 week, Tried Claratin, Nasal spray, Coricidin with little relief    Ear Pain    Patient complains of ear pain, x1 week    Dizziness    Patient complains of dizziness   Cough    Patient complains of cough,x1 day    HPI Natalie Hall presents for concern for possible sinusitis.  She was unable to get into see her primary today.  She states that she had over 1 week history of sinusitis symptoms.  She has some occasional ear pain.  Mild cough.  Upper teeth pain.  She has had some thick yellowish nasal discharge especially over the mornings.  She is tried Claritin, Coricidin, and Flonase without much improvement.  She is also past couple days noticed some bloody discharge and occasional headaches.  Symptoms seem to be getting worse.  Previous intolerance with Levaquin and Augmentin.  She is tried Zithromax in the past for sinusitis but did not respond well.  Has tolerated doxycycline.  Past Medical History:  Diagnosis Date   Asthma    infrequent problem - no treatment since 2006   Back pain    Choroidal nevus of left eye    sees Dr. Gerarda Fraction at [facility].    Constipation    Coronary artery disease    Dysrhythmia    SVT - ablation by Dr. Curt Bears on 05/21/2016 and episodes since   Fatty liver    GERD (gastroesophageal reflux disease)    History of hiatal hernia    "very small"   Hx of colonic polyp    Hyperlipidemia    Hypertension    Joint pain    Melanoma of eye, left ([place])    sees Dr. Cordelia Pen   Myocardial infarction [facility])    Osteoarthritis    Osteoporosis    osteopenia   Shortness of breath    Supraventricular tachycardia ([place])     Past Surgical History:  Procedure Laterality Date   ABDOMINAL HYSTERECTOMY     ACHILLES TENDON REPAIR   10-07-11   torn on right, per Dr. Marge Duncans SURGERY     torn tendon left ankle   BREAST BIOPSY     CARDIAC CATHETERIZATION N/A 03/14/2016   Procedure: Left Heart Cath and Coronary Angiography;  Surgeon: Peter M Martinique, MD;  Location: [place] [facility];  Service: Cardiovascular;  Laterality: N/A;   CARDIAC CATHETERIZATION N/A 03/14/2016   Procedure: Coronary Stent Intervention;  Surgeon: Peter M Martinique, MD;  Location: [place] [facility];  Service: Cardiovascular;  Laterality: N/A;   CARPAL TUNNEL RELEASE     CATARACT EXTRACTION W/ INTRAOCULAR LENS IMPLANT Bilateral    CHOLECYSTECTOMY     COLONOSCOPY  06/23/2014   per Dr. Ardis Hughs, adenomatous polyps, repeat in 7 yrs     ELECTROPHYSIOLOGIC STUDY N/A 05/21/2016   Procedure: SVT Ablation;  Surgeon: [name], MD;  Location: [place] [facility];  Service: Cardiovascular;  Laterality: N/A;   EXCISION HAGLUND'S DEFORMITY WITH ACHILLES TENDON REPAIR Left 07/02/2019   Procedure: Left Achilles tendon debridement and reconstruction and excision of Haglund deformity;  Surgeon: Wylene Simmer, MD;  Location: [place];  Service: Orthopedics;  Laterality: Left;   EYE SURGERY Left 08/2016  GASTROCNEMIUS RECESSION Left 07/02/2019   Procedure: Gastroc recession;  Surgeon: Wylene Simmer, MD;  Location: [place];  Service: Orthopedics;  Laterality: Left;   IR KYPHO THORACIC WITH BONE BIOPSY  12/18/2018   IR RADIOLOGIST EVAL & MGMT  12/09/2018   IR RADIOLOGIST EVAL & MGMT  04/30/2019   KNEE CLOSED REDUCTION Left 02/03/2014   Procedure: CLOSED MANIPULATION LEFT KNEE;  Surgeon: Gearlean Alf, MD;  [facility];  Service: Orthopedics;  Laterality: Left;   POLYPECTOMY     REVERSE SHOULDER ARTHROPLASTY     Left   REVERSE SHOULDER ARTHROPLASTY Left 06/28/2017   Procedure: LEFT SHOULDER REVERSE SHOULDER ARTHROPLASTY;  Surgeon: Netta Cedars, MD;  Location: [place];  Service: Orthopedics;  Laterality: Left;   TOTAL KNEE  ARTHROPLASTY Left 12/21/2013   Procedure: LEFT TOTAL KNEE ARTHROPLASTY;  Surgeon: Gearlean Alf, MD;  [facility];  Service: Orthopedics;  Laterality: Left;   VARICOSE VEIN SURGERY      Family History  Problem Relation Age of Onset   Colon polyps Mother    Diabetes Mother    High blood pressure Mother    High Cholesterol Mother    Thyroid disease Mother    Anxiety disorder Mother    Colon cancer Maternal Aunt    Liver cancer Maternal Aunt    Breast cancer Other    Coronary artery disease Other    Colon cancer Other    Diabetes Other    Hypertension Other    Kidney disease Other    Lung cancer Other    Lung cancer Brother        spleen, bone   Diabetes Father    High blood pressure Father    High Cholesterol Father    Heart disease Father    Depression Father    Obesity Father    Esophageal cancer Neg Hx    Stomach cancer Neg Hx    Rectal cancer Neg Hx     Social History   Socioeconomic History   Marital status: Married    Spouse name: Runner, broadcasting/film/video   Number of children: Not on file   Years of education: Not on file   Highest education level: Associate degree: occupational, Hotel manager, or vocational program  Occupational History   Occupation: retired  Tobacco Use   Smoking status: Never   Smokeless tobacco: Never  Vaping Use   Vaping Use: Never used  Substance and Sexual Activity   Alcohol use: No    Alcohol/week: 0.0 standard drinks   Drug use: No   Sexual activity: Not on file  Other Topics Concern   Not on file  Social History Narrative   Not on file   Social Determinants of Health   Financial Resource Strain: Low Risk    Difficulty of Paying Living Expenses: Not hard at all  Food Insecurity: No Food Insecurity   Worried About Charity fundraiser in the Last Year: Never true   Arboriculturist in the Last Year: Never true  Transportation Needs: Unmet Transportation Needs   Lack of Transportation (Medical): Yes   Lack of Transportation (Non-Medical):  Patient refused  Physical Activity: Sufficiently Active   Days of Exercise per Week: 3 days   Minutes of Exercise per Session: 60 min  Stress: Stress Concern Present   Feeling of Stress : To some extent  Social Connections: Unknown   Frequency of Communication with Friends and Family: Patient refused   Frequency of Social Gatherings with  Friends and Family: Patient refused   Attends Religious Services: More than 4 times per year   Active Member of Clubs or Organizations: Patient refused   Attends Music therapist: More than 4 times per year   Marital Status: Married  Human resources officer Violence: Not At Risk   Fear of Current or Ex-Partner: No   Emotionally Abused: No   Physically Abused: No   Sexually Abused: No    Outpatient Medications Prior to Visit  Medication Sig Dispense Refill   acetaminophen (TYLENOL) 500 MG tablet Take 500 mg by mouth every 6 (six) hours as needed (pain).      aspirin EC 81 MG tablet Take 81 mg by mouth daily.     Calcium Carb-Cholecalciferol 600-800 MG-UNIT TABS Take 1 tablet by mouth daily.      cetirizine (ZYRTEC) 10 MG tablet Take 10 mg by mouth daily as needed for allergies.     Cholecalciferol (VITAMIN D) 50 MCG (2000 UT) CAPS Take 1 capsule (2,000 Units total) by mouth daily. 30 capsule 0   ezetimibe (ZETIA) 10 MG tablet TAKE 1 TABLET BY MOUTH  DAILY 90 tablet 3   fluticasone (FLONASE) 50 MCG/ACT nasal spray Place into both nostrils daily.     metFORMIN (GLUCOPHAGE) 500 MG tablet Take 1 tablet (500 mg total) by mouth daily with breakfast. 90 tablet 0   metoprolol succinate (TOPROL-XL) 25 MG 24 hr tablet TAKE 1 TABLET BY MOUTH  TWICE DAILY 180 tablet 3   Multiple Vitamins-Minerals (MULTIVITAL PO) Take by mouth.     omeprazole (PRILOSEC) 40 MG capsule TAKE 1 CAPSULE BY MOUTH  DAILY 90 capsule 3   predniSONE (DELTASONE) 10 MG tablet Taper as follows: 4-4-4-3-3-3-3-2-2-1-1 32 tablet 0   ramipril (ALTACE) 10 MG capsule TAKE 1 CAPSULE BY MOUTH   DAILY 90 capsule 3   rosuvastatin (CRESTOR) 40 MG tablet TAKE 1 TABLET BY MOUTH  DAILY 90 tablet 3   temazepam (RESTORIL) 15 MG capsule TAKE 1 CAPSULE BY MOUTH  EVERY NIGHT AT BEDTIME AS  NEEDED FOR SLEEP 30 capsule 0   methylPREDNISolone (MEDROL DOSEPAK) 4 MG TBPK tablet Take as directed 21 tablet 0   No facility-administered medications prior to visit.    Allergies  Allergen Reactions   Codeine Other (See Comments), Shortness Of Breath and Rash    flushing flushing    Levofloxacin In D5w Other (See Comments)    Leg pain  Leg pain    Amoxicillin-Pot Clavulanate Rash     Has patient had a PCN reaction causing immediate rash, facial/tongue/throat swelling, SOB or lightheadedness with hypotension: No Has patient had a PCN reaction causing severe rash involving mucus membranes or skin necrosis: No Has patient had a PCN reaction that required hospitalization: No Has patient had a PCN reaction occurring within the last 10 years: Yes If all of the above answers are "NO", then may proceed with Cephalosporin use.   Escitalopram Oxalate Rash    ROS Review of Systems  Constitutional:  Negative for chills and fever.  HENT:  Positive for congestion, sinus pressure and sinus pain.   Respiratory:  Positive for cough. Negative for shortness of breath.   Neurological:  Positive for headaches.     Objective:    Physical Exam Vitals reviewed.  Constitutional:      Appearance: Normal appearance.  HENT:     Right Ear: Tympanic membrane normal.     Left Ear: Tympanic membrane normal.  Cardiovascular:     Rate and  Rhythm: Normal rate.  Pulmonary:     Effort: Pulmonary effort is normal.     Breath sounds: Normal breath sounds. No wheezing or rales.  Musculoskeletal:     Cervical back: Neck supple.  Lymphadenopathy:     Cervical: No cervical adenopathy.  Neurological:     Mental Status: She is alert.    BP 140/60 (BP Location: Left Arm, Patient Position: Sitting, Cuff Size: Normal)    Pulse 76   Temp 97.8 F (36.6 C) (Oral)   Ht 5\' 4"  (1.626 m)   Wt 185 lb 12.8 oz (84.3 kg)   LMP  (LMP Unknown)   SpO2 95%   BMI 31.89 kg/m  Wt Readings from Last 3 Encounters:  09/27/21 185 lb 12.8 oz (84.3 kg)  09/18/21 186 lb 4.8 oz (84.5 kg)  09/11/21 184 lb 8 oz (83.7 kg)     Health Maintenance Due  Topic Date Due   Pneumonia Vaccine 52+ Years old (3 - PPSV23 if available, else PCV20) 10/22/2016    There are no preventive care reminders to display for this patient.  Lab Results  Component Value Date   TSH 1.31 04/13/2021   Lab Results  Component Value Date   WBC 6.4 09/11/2021   HGB 13.6 09/11/2021   HCT 40.4 09/11/2021   MCV 86.7 09/11/2021   PLT 208.0 09/11/2021   Lab Results  Component Value Date   NA 138 04/13/2021   K 4.6 04/13/2021   CO2 28 04/13/2021   GLUCOSE 85 04/13/2021   BUN 20 04/13/2021   CREATININE 0.63 04/13/2021   BILITOT 0.5 04/13/2021   ALKPHOS 36 (L) 04/13/2021   AST 24 04/13/2021   ALT 22 04/13/2021   PROT 7.5 04/13/2021   ALBUMIN 4.2 04/13/2021   CALCIUM 9.6 04/13/2021   ANIONGAP 8 12/18/2018   GFR 89.56 04/13/2021   Lab Results  Component Value Date   CHOL 111 04/13/2021   Lab Results  Component Value Date   HDL 28.80 (L) 04/13/2021   Lab Results  Component Value Date   LDLCALC 50 04/13/2021   Lab Results  Component Value Date   TRIG 164.0 (H) 04/13/2021   Lab Results  Component Value Date   CHOLHDL 4 04/13/2021   Lab Results  Component Value Date   HGBA1C 5.9 04/13/2021      Assessment & Plan:   Sinusitis symptoms.  COVID screen negative.  Patient has had symptoms now really for about 10 days and seem to be progressing with upper teeth pain and bloody thick purulent nasal discharge.  -Start doxycycline 100 mg twice daily for 10 days -Plenty of fluids and rest -Follow-up for any persistent or worsening symptoms   Meds ordered this encounter  Medications   doxycycline (VIBRAMYCIN) 100 MG capsule     Sig: Take 1 capsule (100 mg total) by mouth 2 (two) times daily.    Dispense:  20 capsule    Refill:  0    Follow-up: No follow-ups on file.    Carolann Littler, MD

## 2021-09-27 NOTE — Telephone Encounter (Signed)
Pt last seen by Dawn Whitmire, FNP.  

## 2021-09-28 ENCOUNTER — Other Ambulatory Visit (INDEPENDENT_AMBULATORY_CARE_PROVIDER_SITE_OTHER): Payer: Self-pay | Admitting: Family Medicine

## 2021-09-28 DIAGNOSIS — R7303 Prediabetes: Secondary | ICD-10-CM

## 2021-09-28 MED ORDER — METFORMIN HCL 500 MG PO TABS: 500.0000 mg | ORAL_TABLET | Freq: Every day | ORAL | 0 refills | Status: DC

## 2021-09-28 NOTE — Telephone Encounter (Signed)
LAST APPOINTMENT DATE: 09/11/21 NEXT APPOINTMENT DATE: 10/24/21   [id number] - [place], [place] - [address] [place] [address] Phone: [phone] Fax: [phone]  [facility] ([place], [place] [facility] [address] [place] [address] Phone: [phone] Fax: [phone]  Patient is requesting a refill of the following medications: Requested Prescriptions    No prescriptions requested or ordered in this encounter    Date last filled: 07/11/21 Previously prescribed by Abby Potash  Lab Results  Component Value Date   HGBA1C 5.9 04/13/2021   HGBA1C 5.5 12/06/2020   HGBA1C 6.0 03/31/2020   Lab Results  Component Value Date   LDLCALC 50 04/13/2021   CREATININE 0.63 04/13/2021   Lab Results  Component Value Date   VD25OH 51.1 12/06/2020   VD25OH 105.10 ([place]) 03/31/2020   VD25OH 69.1 11/17/2019    BP Readings from Last 3 Encounters:  09/27/21 140/60  09/18/21 120/66  09/11/21 140/70

## 2021-10-03 ENCOUNTER — Ambulatory Visit: Payer: Medicare Other | Admitting: Family Medicine

## 2021-10-05 NOTE — H&P (Signed)
Patient's anticipated LOS is less than 2 midnights, meeting these requirements: - Younger than 43 - Lives within 1 hour of care - Has a competent adult at home to recover with post-op recover - NO history of  - Chronic pain requiring opiods  - Diabetes  - Coronary Artery Disease  - Heart failure  - Heart attack  - Stroke  - DVT/VTE  - Cardiac arrhythmia  - Respiratory Failure/COPD  - Renal failure  - Anemia  - Advanced Liver disease     Natalie Hall is an 70 y.o. female.    Chief Complaint: right shoulder pain  HPI: Pt is a 70 y.o. female complaining of right shoulder pain for multiple years. Pain had continually increased since the beginning. X-rays in the clinic show end-stage arthritic changes of the right shoulder. Pt has tried various conservative treatments which have failed to alleviate their symptoms, including injections and therapy. Various options are discussed with the patient. Risks, benefits and expectations were discussed with the patient. Patient understand the risks, benefits and expectations and wishes to proceed with surgery.   PCP:  Laurey Morale, MD  D/C Plans: Home  PMH: Past Medical History:  Diagnosis Date   Asthma    infrequent problem - no treatment since 2006   Back pain    Choroidal nevus of left eye    sees Dr. Gerarda Fraction at [place].    Constipation    Coronary artery disease    Dysrhythmia    SVT - ablation by Dr. Curt Bears on 05/21/2016 and episodes since   Fatty liver    GERD (gastroesophageal reflux disease)    History of hiatal hernia    "very small"   Hx of colonic polyp    Hyperlipidemia    Hypertension    Joint pain    Melanoma of eye, left ([place])    sees Dr. Cordelia Pen   Myocardial infarction [facility])    Osteoarthritis    Osteoporosis    osteopenia   Shortness of breath    Supraventricular tachycardia ([place])     PSH: Past Surgical History:  Procedure Laterality Date   ABDOMINAL HYSTERECTOMY     ACHILLES TENDON  REPAIR  10-07-11   torn on right, per Dr. Marge Duncans SURGERY     torn tendon left ankle   BREAST BIOPSY     CARDIAC CATHETERIZATION N/A 03/14/2016   Procedure: Left Heart Cath and Coronary Angiography;  Surgeon: Peter M Martinique, MD;  Location: [place] [facility];  Service: Cardiovascular;  Laterality: N/A;   CARDIAC CATHETERIZATION N/A 03/14/2016   Procedure: Coronary Stent Intervention;  Surgeon: Peter M Martinique, MD;  Location: [place] [facility];  Service: Cardiovascular;  Laterality: N/A;   CARPAL TUNNEL RELEASE     CATARACT EXTRACTION W/ INTRAOCULAR LENS IMPLANT Bilateral    CHOLECYSTECTOMY     COLONOSCOPY  06/23/2014   per Dr. Ardis Hughs, adenomatous polyps, repeat in 7 yrs     ELECTROPHYSIOLOGIC STUDY N/A 05/21/2016   Procedure: SVT Ablation;  Surgeon: [name], MD;  Location: [place] [facility];  Service: Cardiovascular;  Laterality: N/A;   EXCISION HAGLUND'S DEFORMITY WITH ACHILLES TENDON REPAIR Left 07/02/2019   Procedure: Left Achilles tendon debridement and reconstruction and excision of Haglund deformity;  Surgeon: Wylene Simmer, MD;  Location: [place];  Service: Orthopedics;  Laterality: Left;   EYE SURGERY Left 08/2016   GASTROCNEMIUS RECESSION Left 07/02/2019   Procedure: Gastroc recession;  Surgeon: Wylene Simmer, MD;  Location: [place];  Service: Orthopedics;  Laterality: Left;   IR KYPHO THORACIC WITH BONE BIOPSY  12/18/2018   IR RADIOLOGIST EVAL & MGMT  12/09/2018   IR RADIOLOGIST EVAL & MGMT  04/30/2019   KNEE CLOSED REDUCTION Left 02/03/2014   Procedure: CLOSED MANIPULATION LEFT KNEE;  Surgeon: Gearlean Alf, MD;  [facility];  Service: Orthopedics;  Laterality: Left;   POLYPECTOMY     REVERSE SHOULDER ARTHROPLASTY     Left   REVERSE SHOULDER ARTHROPLASTY Left 06/28/2017   Procedure: LEFT SHOULDER REVERSE SHOULDER ARTHROPLASTY;  Surgeon: Netta Cedars, MD;  Location: [place];  Service: Orthopedics;  Laterality: Left;   TOTAL  KNEE ARTHROPLASTY Left 12/21/2013   Procedure: LEFT TOTAL KNEE ARTHROPLASTY;  Surgeon: Gearlean Alf, MD;  [facility];  Service: Orthopedics;  Laterality: Left;   VARICOSE VEIN SURGERY      Social History:  reports that she has never smoked. She has never used smokeless tobacco. She reports that she does not drink alcohol and does not use drugs.  Allergies:  Allergies  Allergen Reactions   Codeine Other (See Comments), Shortness Of Breath and Rash    flushing flushing    Levofloxacin In D5w Other (See Comments)    Leg pain  Leg pain    Amoxicillin-Pot Clavulanate Rash     Has patient had a PCN reaction causing immediate rash, facial/tongue/throat swelling, SOB or lightheadedness with hypotension: No Has patient had a PCN reaction causing severe rash involving mucus membranes or skin necrosis: No Has patient had a PCN reaction that required hospitalization: No Has patient had a PCN reaction occurring within the last 10 years: Yes If all of the above answers are "NO", then may proceed with Cephalosporin use.   Escitalopram Oxalate Rash    Medications: No current facility-administered medications for this encounter.   Current Outpatient Medications  Medication Sig Dispense Refill   acetaminophen (TYLENOL) 500 MG tablet Take 500 mg by mouth every 6 (six) hours as needed (pain).      aspirin EC 81 MG tablet Take 81 mg by mouth daily.     Calcium Carb-Cholecalciferol 600-800 MG-UNIT TABS Take 1 tablet by mouth daily.      cetirizine (ZYRTEC) 10 MG tablet Take 10 mg by mouth daily as needed for allergies.     Cholecalciferol (VITAMIN D) 50 MCG (2000 UT) CAPS Take 1 capsule (2,000 Units total) by mouth daily. 30 capsule 0   doxycycline (VIBRAMYCIN) 100 MG capsule Take 1 capsule (100 mg total) by mouth 2 (two) times daily. 20 capsule 0   ezetimibe (ZETIA) 10 MG tablet TAKE 1 TABLET BY MOUTH  DAILY 90 tablet 3   fluticasone (FLONASE) 50 MCG/ACT nasal spray Place into both nostrils  daily.     metFORMIN (GLUCOPHAGE) 500 MG tablet Take 1 tablet (500 mg total) by mouth daily with breakfast. 90 tablet 0   metoprolol succinate (TOPROL-XL) 25 MG 24 hr tablet TAKE 1 TABLET BY MOUTH  TWICE DAILY 180 tablet 3   Multiple Vitamins-Minerals (MULTIVITAL PO) Take by mouth.     omeprazole (PRILOSEC) 40 MG capsule TAKE 1 CAPSULE BY MOUTH  DAILY 90 capsule 3   predniSONE (DELTASONE) 10 MG tablet Taper as follows: 4-4-4-3-3-3-3-2-2-1-1 32 tablet 0   ramipril (ALTACE) 10 MG capsule TAKE 1 CAPSULE BY MOUTH  DAILY 90 capsule 3   rosuvastatin (CRESTOR) 40 MG tablet TAKE 1 TABLET BY MOUTH  DAILY 90 tablet  3   temazepam (RESTORIL) 15 MG capsule TAKE 1 CAPSULE BY MOUTH  EVERY NIGHT AT BEDTIME AS  NEEDED FOR SLEEP 30 capsule 0    No results found for this or any previous visit (from the past 48 hour(s)). No results found.  ROS: Pain with rom of the right upper extremity  Physical Exam: Alert and oriented 70 y.o. female in no acute distress Cranial nerves 2-12 intact Cervical spine: full rom with no tenderness, nv intact distally Chest: active breath sounds bilaterally, no wheeze rhonchi or rales Heart: regular rate and rhythm, no murmur Abd: non tender non distended with active bowel sounds Hip is stable with rom  Right shoulder painful and weak rom Nv intact distally No rashes or edema distally  Assessment/Plan Assessment: right shoulder cuff arthropathy  Plan:  Patient will undergo a right reverse total shoulder by Dr. [name] at [facility] Risks benefits and expectations were discussed with the patient. Patient understand risks, benefits and expectations and wishes to proceed. Preoperative templating of the joint replacement has been completed, documented, and submitted to the Operating Room personnel in order to optimize intra-operative equipment management.   Merla Riches PA-C, MPAS [facility] is now [facility] [employer] [address], [place], [place],  [place] [address] Phone: [phone] [email or web address]   [employer]

## 2021-10-06 NOTE — Progress Notes (Addendum)
Office Visit    Patient Name: Natalie Hall Date of Encounter: 10/09/2021  PCP:  Laurey Morale, MD   [place]  Cardiologist:  Pixie Casino, MD  Advanced Practice Provider:  No care team member to display Electrophysiologist:  None    Chief Complaint    Natalie Hall is a 70 y.o. female with a hx of supraventricular tachycardia, hypertension, hyperlipidemia, GERD, CAD, back pain, and asthma presents today for 54-month follow-up appointment and pre-op clearance.  Past Medical History    Past Medical History:  Diagnosis Date   Asthma    infrequent problem - no treatment since 2006   Back pain    Choroidal nevus of left eye    sees Dr. Gerarda Fraction at [place].    Constipation    Coronary artery disease    Dysrhythmia    SVT - ablation by Dr. Curt Bears on 05/21/2016 and episodes since   Fatty liver    GERD (gastroesophageal reflux disease)    History of hiatal hernia    "very small"   Hx of colonic polyp    Hyperlipidemia    Hypertension    Joint pain    Melanoma of eye, left ([place])    sees Dr. Cordelia Pen   Myocardial infarction [facility])    Osteoarthritis    Osteoporosis    osteopenia   Shortness of breath    Supraventricular tachycardia ([place])    Past Surgical History:  Procedure Laterality Date   ABDOMINAL HYSTERECTOMY     ACHILLES TENDON REPAIR  10-07-11   torn on right, per Dr. Marge Duncans SURGERY     torn tendon left ankle   BREAST BIOPSY     CARDIAC CATHETERIZATION N/A 03/14/2016   Procedure: Left Heart Cath and Coronary Angiography;  Surgeon: Peter M Martinique, MD;  Location: [place] [facility];  Service: Cardiovascular;  Laterality: N/A;   CARDIAC CATHETERIZATION N/A 03/14/2016   Procedure: Coronary Stent Intervention;  Surgeon: Peter M Martinique, MD;  Location: [facility];  Service: Cardiovascular;  Laterality: N/A;   CARPAL TUNNEL RELEASE     CATARACT EXTRACTION W/ INTRAOCULAR LENS IMPLANT Bilateral     CHOLECYSTECTOMY     COLONOSCOPY  06/23/2014   per Dr. Ardis Hughs, adenomatous polyps, repeat in 7 yrs     ELECTROPHYSIOLOGIC STUDY N/A 05/21/2016   Procedure: SVT Ablation;  Surgeon: [name], MD;  Location: [place];  Service: Cardiovascular;  Laterality: N/A;   EXCISION HAGLUND'S DEFORMITY WITH ACHILLES TENDON REPAIR Left 07/02/2019   Procedure: Left Achilles tendon debridement and reconstruction and excision of Haglund deformity;  Surgeon: Wylene Simmer, MD;  Location: [place];  Service: Orthopedics;  Laterality: Left;   EYE SURGERY Left 08/2016   GASTROCNEMIUS RECESSION Left 07/02/2019   Procedure: Gastroc recession;  Surgeon: Wylene Simmer, MD;  Location: [facility];  Service: Orthopedics;  Laterality: Left;   IR KYPHO THORACIC WITH BONE BIOPSY  12/18/2018   IR RADIOLOGIST EVAL & MGMT  12/09/2018   IR RADIOLOGIST EVAL & MGMT  04/30/2019   KNEE CLOSED REDUCTION Left 02/03/2014   Procedure: CLOSED MANIPULATION LEFT KNEE;  Surgeon: Gearlean Alf, MD;  Location: [facility];  Service: Orthopedics;  Laterality: Left;   POLYPECTOMY     REVERSE SHOULDER ARTHROPLASTY     Left   REVERSE SHOULDER ARTHROPLASTY Left 06/28/2017   Procedure: LEFT SHOULDER REVERSE SHOULDER ARTHROPLASTY;  Surgeon: [name],  Richardson Landry, MD;  Location: [facility];  Service: Orthopedics;  Laterality: Left;   TOTAL KNEE ARTHROPLASTY Left 12/21/2013   Procedure: LEFT TOTAL KNEE ARTHROPLASTY;  Surgeon: Gearlean Alf, MD;  Location: [facility];  Service: Orthopedics;  Laterality: Left;   VARICOSE VEIN SURGERY      Allergies  Allergies  Allergen Reactions   Codeine Other (See Comments), Shortness Of Breath and Rash    flushing flushing    Levofloxacin In D5w Other (See Comments)    Leg pain  Leg pain    Amoxicillin-Pot Clavulanate Rash     Has patient had a PCN reaction causing immediate rash, facial/tongue/throat swelling, SOB or lightheadedness with hypotension: No Has patient had a PCN  reaction causing severe rash involving mucus membranes or skin necrosis: No Has patient had a PCN reaction that required hospitalization: No Has patient had a PCN reaction occurring within the last 10 years: Yes If all of the above answers are "NO", then may proceed with Cephalosporin use.   Escitalopram Oxalate Rash    History of Present Illness    Natalie Hall is a 70 y.o. female with a hx of supraventricular tachycardia, hypertension, hyperlipidemia, GERD, CAD, back pain, and asthma presents today for 68-month follow-up appointment. She was last seen February 2022 by Dr. Debara Pickett.   She was originally seen a few weeks before that time and she had been admitted for SVT which spontaneously converted in ER on Cardizem and did not return.  Unfortunately she had a non-ST elevation MI with troponin peak of 1.64.  She underwent a Lexiscan Myoview which demonstrated a moderate region of reversibility in the mid to apical segments of the inferior lateral [place] consistent with ischemia.  EF was 61%.  She then was referred to cardiac catheterization.  That was performed 03/14/2016 which showed proximal RCA lesion of 25% stenosis, left ventricular systolic function was normal, proximal to mid LAD lesion of 50% stenosis, mid RCA 1 lesion of 95% stenosis, mid RCA to lesion of 95% stenosis.  Post intervention there was 0% residual stenosis.  A Synergy DES was placed.  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  She underwent successful catheter ablation and had not had a recurrence.  She returned for follow-up 09/2016 and was doing well from a coronary standpoint.  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  She underwent placement of radioactive seed which was ultimately removed after a week.  She was off her aspirin for 1 week but remained on Brilinta.  She was still enrolled in the twilight trial.  She returned 05/2017 for follow-up appointment.  She was angina free.  She was  taken off of her dual antiplatelet therapy and advised to take aspirin 81 mg daily.  Her cholesterol was at goal.  March 2019 she was seen for follow-up.  She was recovering from a successful shoulder surgery.  She is working on some dietary changes and maintaining stress level.  In September 2019 and there was not a significant difference in her weight.  Her lipids were checked and we did not recommend additional medication.  August 2020 she had been working closely with the healthy weight and wellness center at [facility].  She had lost a significant amount of weight.  She went from 213 pounds to 183 in the office.  However when she was at home she is in the 170s.  Her labs looked excellent at this time.  She was seen  in February 2021 and continues to do well.  Her cholesterol was much improved at that time.  She had no further arrhythmias or chest pain.  She was last seen by Dr. Debara Pickett February 2022.  Overall she was doing well.  She denied any recurrent tachypalpitations.  Blood pressure was noted to be on the low end however she was recently started on Ozempic but did not tolerate it for weight loss.  Today, she is doing well without any new cardiac issues.  She recently had bronchitis and she is still recovering.  She had a full course of antibiotics but does have some residual wheezing on exam today.  She had an ablation back in July and has not had any recurrent SVT.  Her blood pressure is at goal and she still takes it at home.  She usually runs in the 737T systolic.  She has been following up with healthy weight and wellness through [facility] and has lost 35 pounds.  She continues to remain active and watch her diet.  She had a left shoulder arthroplasty done back in 2018 and is now getting her right shoulder arthroplasty performed.  She has presented today for cardiac clearance and routine follow-up.  Reports no shortness of breath nor dyspnea on exertion. Reports no chest pain, pressure, or tightness. No  edema, orthopnea, PND. Reports no palpitations.     EKGs/Labs/Other Studies Reviewed:   The following studies were reviewed today:  Cardiac catheterization 03/14/2016 Prox RCA lesion, 25% stenosed. The left ventricular systolic function is normal. Prox LAD to Mid LAD lesion, 15% stenosed. Mid RCA-1 lesion, 95% stenosed. Mid RCA-2 lesion, 95% stenosed. Post intervention, there is a 0% residual stenosis.   1. Single vessel obstructive CAD 2. Normal LV function 3. Successful stenting of the mid to distal RCA with a DES   Plan: DAPT for one year. Patient may be a candidate for the TWILIGHT trial in which case study protocol will be followed. Anticipate DC in am.  Echocardiogram 03/12/2016  Study Conclusions   - Left ventricle: The cavity size was normal. Garrido thickness was    normal. Systolic function was vigorous. The estimated ejection    fraction was in the range of 65% to 70%. Doppler parameters are    consistent with abnormal left ventricular relaxation (grade 1    diastolic dysfunction).   MYOCARDIAL IMAGING WITH SPECT (REST AND PHARMACOLOGIC-STRESS)  02/2016 IMPRESSION: 1. Moderate region of reversibility within the mid and apical segments of the inferolateral [place]stenson is concerning for stress-induced ischemia.   2. Mild septal hypokinesia.   3. Left ventricular ejection fraction 61%   4. Non invasive risk stratification*: Intermediate (moderate perfusion defect)   *2012 Appropriate Use Criteria for Coronary Revascularization Focused Update: J Am Coll Cardiol. 0626;94(8):546-270. http://content.airportbarriers.com.aspx?articleid=[email or web address]   These results will be called to the ordering clinician or representative by the Radiologist Assistant, and communication documented in the PACS or zVision Dashboard.     Electronically Signed   By: Suzy Bouchard [name]   On: 03/13/2016 14:24    EKG:  EKG is  ordered today.  The ekg ordered today demonstrates Normal sinus  rhythm, rate 62 bpm. Stable compared to previous EKG.   Recent Labs: 04/13/2021: ALT 22; BUN 20; Creatinine, Ser 0.63; Potassium 4.6; Sodium 138; TSH 1.31 09/11/2021: Hemoglobin 13.6; Platelets 208.0  Recent Lipid Panel    Component Value Date/Time   CHOL 111 04/13/2021 1034   CHOL 106 12/06/2020 0910   TRIG 164.0 (H) 04/13/2021 1034  HDL 28.80 (L) 04/13/2021 1034   HDL 28 (L) 12/06/2020 0910   CHOLHDL 4 04/13/2021 1034   VLDL 32.8 04/13/2021 1034   LDLCALC 50 04/13/2021 1034   LDLCALC 53 12/06/2020 0910   LDLDIRECT 96.0 03/11/2018 0904   Home Medications   Current Meds  Medication Sig   acetaminophen (TYLENOL) 500 MG tablet Take 500 mg by mouth every 6 (six) hours as needed (pain).    albuterol (VENTOLIN HFA) 108 (90 Base) MCG/ACT inhaler Inhale 2 puffs into the lungs every 4 (four) hours as needed.   aspirin EC 81 MG tablet Take 81 mg by mouth daily.   Calcium Carb-Cholecalciferol 600-800 MG-UNIT TABS Take 1 tablet by mouth daily.    cetirizine (ZYRTEC) 10 MG tablet Take 10 mg by mouth daily as needed for allergies.   Cholecalciferol (VITAMIN D) 50 MCG (2000 UT) CAPS Take 1 capsule (2,000 Units total) by mouth daily.   ezetimibe (ZETIA) 10 MG tablet TAKE 1 TABLET BY MOUTH  DAILY   fluticasone (FLONASE) 50 MCG/ACT nasal spray Place into both nostrils daily.   metFORMIN (GLUCOPHAGE) 500 MG tablet Take 1 tablet (500 mg total) by mouth daily with breakfast.   metoprolol succinate (TOPROL-XL) 25 MG 24 hr tablet TAKE 1 TABLET BY MOUTH  TWICE DAILY   Multiple Vitamins-Minerals (MULTIVITAL PO) Take by mouth.   omeprazole (PRILOSEC) 40 MG capsule TAKE 1 CAPSULE BY MOUTH  DAILY   ramipril (ALTACE) 10 MG capsule TAKE 1 CAPSULE BY MOUTH  DAILY   rosuvastatin (CRESTOR) 40 MG tablet TAKE 1 TABLET BY MOUTH  DAILY   temazepam (RESTORIL) 15 MG capsule TAKE 1 CAPSULE BY MOUTH  EVERY NIGHT AT BEDTIME AS  NEEDED FOR SLEEP     Review of Systems      All other systems reviewed and are otherwise  negative except as noted above.  Physical Exam    VS:  BP 130/72    Pulse 62    Ht 5' 4.5" (1.638 m)    Wt 189 lb 1.6 oz (85.8 kg)    LMP  (LMP Unknown)    BMI 31.96 kg/m  , BMI Body mass index is 31.96 kg/m.  Wt Readings from Last 3 Encounters:  10/09/21 189 lb 1.6 oz (85.8 kg)  09/27/21 185 lb 12.8 oz (84.3 kg)  09/18/21 186 lb 4.8 oz (84.5 kg)     GEN: Well nourished, well developed, in no acute distress. HEENT: normal. Neck: Supple, no JVD, carotid bruits, or masses. Cardiac: RRR, no murmurs, rubs, or gallops. No clubbing, cyanosis, edema.  Radials/PT 2+ and equal bilaterally.  Respiratory:  Respirations regular and unlabored, bilateral lower lobe wheezing GI: Soft, nontender, nondistended. MS: No deformity or atrophy. Skin: Warm and dry, no rash. Neuro:  Strength and sensation are intact. Psych: Normal affect.  Assessment & Plan    Preop Clearance Click Here to Calculate RCRI      :[id number]}   Ms. Guerrant's perioperative risk of a major cardiac event is 0.9% according to the Revised Cardiac Risk Index (RCRI).  Therefore, she is at low risk for perioperative complications.   Her functional capacity is good at 5.62 METs according to the Duke Activity Status Index (DASI). Recommendations: According to ACC/AHA guidelines, no further cardiovascular testing needed.  The patient may proceed to surgery at acceptable risk.   Antiplatelet and/or Anticoagulation Recommendations: Aspirin can be held for 7 days prior to her surgery.  Please resume Aspirin post operatively when it is felt to be safe  from a bleeding standpoint.   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  -Melanoma in left eye underwent radiation in 2017.  -Recent liver function tests unremarkable (03/2021)  Disposition: Follow up in 3 months with Pixie Casino, MD or APP.   Click Here to Calculate RCRI      :[id number]}   Ms. Northrup's perioperative risk of a major cardiac event is 0.9% according to the Revised Cardiac Risk Index (RCRI).  Therefore, she is at low risk for perioperative complications.   Her functional capacity is good at 5.62 METs according to the Duke Activity Status Index (DASI). Recommendations: According to ACC/AHA guidelines, no further cardiovascular testing needed.  The patient may proceed to surgery at acceptable risk.   Antiplatelet and/or Anticoagulation Recommendations: Aspirin can be held for 7 days prior to her surgery.  Please resume Aspirin post operatively when it is felt to be safe from a bleeding standpoint.    Signed, Elgie Collard, PA-C 10/09/2021, 8:44 AM [place]

## 2021-10-09 ENCOUNTER — Encounter (HOSPITAL_BASED_OUTPATIENT_CLINIC_OR_DEPARTMENT_OTHER): Payer: Self-pay | Admitting: Physician Assistant

## 2021-10-09 ENCOUNTER — Other Ambulatory Visit: Payer: Self-pay

## 2021-10-09 ENCOUNTER — Ambulatory Visit (INDEPENDENT_AMBULATORY_CARE_PROVIDER_SITE_OTHER): Payer: Medicare Other | Admitting: Physician Assistant

## 2021-10-09 VITALS — BP 130/72 | HR 62 | Ht 64.5 in | Wt 189.1 lb

## 2021-10-09 DIAGNOSIS — I471 Supraventricular tachycardia: Secondary | ICD-10-CM | POA: Diagnosis not present

## 2021-10-09 DIAGNOSIS — Z01810 Encounter for preprocedural cardiovascular examination: Secondary | ICD-10-CM

## 2021-10-09 DIAGNOSIS — E785 Hyperlipidemia, unspecified: Secondary | ICD-10-CM

## 2021-10-09 DIAGNOSIS — I2510 Atherosclerotic heart disease of native coronary artery without angina pectoris: Secondary | ICD-10-CM

## 2021-10-09 DIAGNOSIS — I10 Essential (primary) hypertension: Secondary | ICD-10-CM

## 2021-10-09 DIAGNOSIS — I214 Non-ST elevation (NSTEMI) myocardial infarction: Secondary | ICD-10-CM | POA: Diagnosis not present

## 2021-10-09 DIAGNOSIS — R7401 Elevation of levels of liver transaminase levels: Secondary | ICD-10-CM

## 2021-10-09 NOTE — Patient Instructions (Signed)
Medication Instructions:  Your Physician recommend you continue on your current medication as directed.    *If you need a refill on your cardiac medications before your next appointment, please call your pharmacy*   Lab Work: Please return for Lab work 1-2 weeks before your upcoming appointment with Dr. Debara Pickett in March.  You may come to the...   [facility] (3rd floor) [address] [place], [place] [address]  Open: 8am-Noon and 1pm-4:30pm   [place] [place]- Any location  **no appointments needed**  If you have labs (blood work) drawn today and your tests are completely normal, you will receive your results only by: [employer] (if you have MyChart) OR A paper copy in the mail If you have any lab test that is abnormal or we need to change your treatment, we will call you to review the results.   Testing/Procedures: You are cleared for your shoulder surgery.    Follow-Up: At [facility], you and your health needs are our priority.  As part of our continuing mission to provide you with exceptional heart care, we have created designated Provider Care Teams.  These Care Teams include your primary Cardiologist (physician) and Advanced Practice Providers (APPs -  Physician Assistants and Nurse Practitioners) who all work together to provide you with the care you need, when you need it.  We recommend signing up for the patient portal called "MyChart".  Sign up information is provided on this After Visit Summary.  MyChart is used to connect with patients for Virtual Visits (Telemedicine).  Patients are able to view lab/test results, encounter notes, upcoming appointments, etc.  Non-urgent messages can be sent to your provider as well.   To learn more about what you can do with MyChart, go to [email or web address].    Your next appointment:   March 3rd at 2:15pm  The format for your next  appointment:   In Person  Provider:   Pixie Casino, MD     Other Instructions None today

## 2021-10-09 NOTE — Addendum Note (Signed)
Addended by: Gerald Stabs on: 10/09/2021 01:58 PM   Modules accepted: Orders

## 2021-10-12 ENCOUNTER — Ambulatory Visit (INDEPENDENT_AMBULATORY_CARE_PROVIDER_SITE_OTHER): Payer: Medicare Other | Admitting: Family Medicine

## 2021-10-12 ENCOUNTER — Encounter: Payer: Self-pay | Admitting: Family Medicine

## 2021-10-12 VITALS — BP 120/68 | HR 71 | Temp 98.7°F | Wt 185.0 lb

## 2021-10-12 DIAGNOSIS — J40 Bronchitis, not specified as acute or chronic: Secondary | ICD-10-CM

## 2021-10-12 MED ORDER — TEMAZEPAM 15 MG PO CAPS: 15.0000 mg | ORAL_CAPSULE | Freq: Every day | ORAL | 1 refills | Status: DC

## 2021-10-12 MED ORDER — TRAMADOL HCL 50 MG PO TABS: 50.0000 mg | ORAL_TABLET | Freq: Four times a day (QID) | ORAL | 0 refills | Status: DC | PRN

## 2021-10-12 MED ORDER — ALBUTEROL SULFATE HFA 108 (90 BASE) MCG/ACT IN AERS: 2.0000 | INHALATION_SPRAY | RESPIRATORY_TRACT | 2 refills | Status: DC | PRN

## 2021-10-12 NOTE — Progress Notes (Signed)
° °  Subjective:    Patient ID: Natalie Hall, female    DOB: 1951/02/22, 70 y.o.   MRN: [id number]  HPI Here to follow up on bronchitis. This started about 3 weeks ago with sinus congestion and PND. She saw Dr. Elease Hashimoto here on 09-27-21 and he gave her 10 days of Doxycycline. Then she and her husband went on a trip to [facility], [place] and the infection seemed to move into her chest. On 10-03-21 she was an urgent care in [facility], and they diagnosed her with bronchitis. She did not have a CXR, but the provider said she heard wheezes in her lungs. They told her to finish out the Doxycycline and they added 5 days of Prednisone and an albuterol inhaler. Since then she has improved quite a bit but she still has a dry cough. She never had a fever.    Review of Systems  Constitutional: Negative.   HENT: Negative.    Eyes: Negative.   Respiratory:  Positive for cough. Negative for shortness of breath and wheezing.   Cardiovascular: Negative.       Objective:   Physical Exam Constitutional:      Appearance: Normal appearance. She is not ill-appearing.  HENT:     Right Ear: Tympanic membrane, ear canal and external ear normal.     Left Ear: Tympanic membrane, ear canal and external ear normal.     Nose: Nose normal.     Mouth/Throat:     Pharynx: Oropharynx is clear.  Eyes:     Conjunctiva/sclera: Conjunctivae normal.  Pulmonary:     Effort: Pulmonary effort is normal.     Breath sounds: Normal breath sounds.  Lymphadenopathy:     Cervical: No cervical adenopathy.  Neurological:     Mental Status: She is alert.          Assessment & Plan:  She is recovering from a bronchitis. We refilled the inhaler to use as needed. She should be fine in a week or two. Recheck prn. Alysia Penna, MD

## 2021-10-14 ENCOUNTER — Other Ambulatory Visit: Payer: Self-pay | Admitting: Internal Medicine

## 2021-10-14 DIAGNOSIS — E782 Mixed hyperlipidemia: Secondary | ICD-10-CM

## 2021-10-14 DIAGNOSIS — I10 Essential (primary) hypertension: Secondary | ICD-10-CM

## 2021-10-16 ENCOUNTER — Encounter: Payer: Self-pay | Admitting: Family Medicine

## 2021-10-17 ENCOUNTER — Other Ambulatory Visit: Payer: Self-pay

## 2021-10-17 DIAGNOSIS — M549 Dorsalgia, unspecified: Secondary | ICD-10-CM

## 2021-10-17 MED ORDER — CYCLOBENZAPRINE HCL 10 MG PO TABS: 10.0000 mg | ORAL_TABLET | Freq: Three times a day (TID) | ORAL | 1 refills | Status: DC | PRN

## 2021-10-17 NOTE — Telephone Encounter (Signed)
Call in Flexeril 10 mg to take TID as needed for muscle spasms, #60 with one rf

## 2021-10-19 NOTE — Progress Notes (Addendum)
COVID swab appointment:11/01/21 @ 0945  COVID Vaccine Completed: yes x5 Date COVID Vaccine completed: 11/17/19, 12/08/19 Has received booster: 08/30/20, 05/24/21, 08/10/21 COVID vaccine manufacturer: [place]      Date of COVID positive in last 90 days: no  PCP - Alysia Penna, MD Cardiologist - Lyman Bishop, MD  Cardiac clearance 10/09/21 by Nicholes Rough in [place]  Chest x-ray - 04/13/21 Epic EKG - 10/09/21 Epic Stress Test - 03/13/16 Epic ECHO - 03/12/16 Epic Cardiac Cath - 03/14/16 Epic Pacemaker/ICD device last checked: n/a Spinal Cord Stimulator: n/a  Sleep Study - n/a CPAP -   Fasting Blood Sugar - no check at home Checks Blood Sugar __ times a day  Blood Thinner Instructions: Aspirin Instructions: ASA 81, hold 7 days Last Dose:  Activity level: Can go up a flight of stairs and perform activities of daily living without stopping and without symptoms of chest pain or shortness of breath.       Anesthesia review: HTN, SVT, NSTEMI, CAD, asthma, SOB, fatty liver, DM   Patient denies shortness of breath, fever, cough and chest pain at PAT appointment   Patient verbalized understanding of instructions that were given to them at the PAT appointment. Patient was also instructed that they will need to review over the PAT instructions again at home before surgery.

## 2021-10-19 NOTE — Patient Instructions (Addendum)
DUE TO COVID-19 ONLY ONE VISITOR IS ALLOWED TO COME WITH YOU AND STAY IN THE WAITING ROOM ONLY DURING PRE OP AND PROCEDURE.   **NO VISITORS ARE ALLOWED IN THE SHORT STAY AREA OR RECOVERY ROOM!!**  IF YOU WILL BE ADMITTED INTO THE HOSPITAL YOU ARE ALLOWED ONLY TWO SUPPORT PEOPLE DURING VISITATION HOURS ONLY (7AM -8PM)   The support person(s) may change daily. The support person(s) must pass our screening, gel in and out, and wear a mask at all times, including in the patients room. Patients must also wear a mask when staff or their support person are in the room.  No visitors under the age of 4. Any visitor under the age of 86 must be accompanied by an adult.    COVID SWAB TESTING MUST BE COMPLETED ON:  11/01/21 @ 9:45 AM    Come to [facility] Entrance. Have a seat in lobby to the right. Call [phone]. Tell them your name and that you are here for a COVID test. You are not required to quarantine, however you are required to wear a well-fitted mask when you are out and around people not in your household.  Hand Hygiene often Do NOT share personal items Notify your provider if you are in close contact with someone who has COVID or you develop fever 100.4 or greater, new onset of sneezing, cough, sore throat, shortness of breath or body aches.       Your procedure is scheduled on: 11/03/21   Report to [facility] Main Entrance    Report to admitting at 5:15 AM   Call this number if you have problems the morning of surgery [phone]   Do not eat food :After Midnight.   May have liquids until 4:30 AM day of surgery  CLEAR LIQUID DIET  Foods Allowed                                                                     Foods Excluded  Water, Black Coffee and tea (no milk or creamer)           liquids that you cannot  Plain Jell-O in any flavor  (No red)                                    see through such as: Fruit ices (not with fruit pulp)                                             milk, soups, orange juice              Iced Popsicles (No red)                                                All solid food  Apple juices Sports drinks like Gatorade (No red) Lightly seasoned clear broth or consume(fat free) Sugar    The day of surgery:  Drink ONE (1) Pre-Surgery G2 by 4:30 am the morning of surgery. Drink in one sitting. Do not sip.  This drink was given to you during your hospital  pre-op appointment visit. Nothing else to drink after completing the  Pre-Surgery G2.          If you have questions, please contact your surgeons office.     Oral Hygiene is also important to reduce your risk of infection.                                    Remember - BRUSH YOUR TEETH THE MORNING OF SURGERY WITH YOUR REGULAR TOOTHPASTE   Stop taking Aspirin and all vitamins and supplements 7 days before surgery   Take these medicines the morning of surgery with A SIP OF WATER: Tylenol, Inhalers, Zyrtec, Metoprolol, Omeprazole, Crestor, Tramadol.   DO NOT TAKE ANY ORAL DIABETIC MEDICATIONS DAY OF YOUR SURGERY  How to Manage Your Diabetes Before and After Surgery  Why is it important to control my blood sugar before and after surgery? Improving blood sugar levels before and after surgery helps healing and can limit problems. A way of improving blood sugar control is eating a healthy diet by:  Eating less sugar and carbohydrates  Increasing activity/exercise  Talking with your doctor about reaching your blood sugar goals High blood sugars (greater than 180 mg/dL) can raise your risk of infections and slow your recovery, so you will need to focus on controlling your diabetes during the weeks before surgery. Make sure that the doctor who takes care of your diabetes knows about your planned surgery including the date and location.  How do I manage my blood sugar before surgery? Check your blood sugar at least 4 times a day,  starting 2 days before surgery, to make sure that the level is not too high or low. Check your blood sugar the morning of your surgery when you wake up and every 2 hours until you get to the Short Stay unit. If your blood sugar is less than 70 mg/dL, you will need to treat for low blood sugar: Do not take insulin. Treat a low blood sugar (less than 70 mg/dL) with  cup of clear juice (cranberry or apple), 4 glucose tablets, OR glucose gel. Recheck blood sugar in 15 minutes after treatment (to make sure it is greater than 70 mg/dL). If your blood sugar is not greater than 70 mg/dL on recheck, call [phone] for further instructions. Report your blood sugar to the short stay nurse when you get to Short Stay.  If you are admitted to the hospital after surgery: Your blood sugar will be checked by the staff and you will probably be given insulin after surgery (instead of oral diabetes medicines) to make sure you have good blood sugar levels. The goal for blood sugar control after surgery is 80-180 mg/dL.   WHAT DO I DO ABOUT MY DIABETES MEDICATION?  Do not take oral diabetes medicines (pills) the morning of surgery.  THE DAY BEFORE SURGERY, take Metformin as prescribed       THE MORNING OF SURGERY, do not take Metformin.   Reviewed and Endorsed by [facility] Patient Education Committee, August 2015  You may not have any metal on your body including hair pins, jewelry, and body piercing             Do not wear make-up, lotions, powders, perfumes, or deodorant  Do not wear nail polish including gel and S&S, artificial/acrylic nails, or any other type of covering on natural nails including finger and toenails. If you have artificial nails, gel coating, etc. that needs to be removed by a nail salon please have this removed prior to surgery or surgery may need to be canceled/ delayed if the surgeon/ anesthesia feels like they are unable to be safely monitored.    Do not shave  48 hours prior to surgery.    Do not bring valuables to the hospital. [place].   Bring small overnight bag day of surgery.              Please read over the following fact sheets you were given: IF YOU HAVE QUESTIONS ABOUT YOUR PRE-OP INSTRUCTIONS PLEASE CALL [facility] - Preparing for Surgery Before surgery, you can play an important role.  Because skin is not sterile, your skin needs to be as free of germs as possible.  You can reduce the number of germs on your skin by washing with CHG (chlorahexidine gluconate) soap before surgery.  CHG is an antiseptic cleaner which kills germs and bonds with the skin to continue killing germs even after washing. Please DO NOT use if you have an allergy to CHG or antibacterial soaps.  If your skin becomes reddened/irritated stop using the CHG and inform your nurse when you arrive at Short Stay. Do not shave (including legs and underarms) for at least 48 hours prior to the first CHG shower.  You may shave your face/neck.  Please follow these instructions carefully:  1.  Shower with CHG Soap the night before surgery and the  morning of surgery.  2.  If you choose to wash your hair, wash your hair first as usual with your normal  shampoo.  3.  After you shampoo, rinse your hair and body thoroughly to remove the shampoo.                             4.  Use CHG as you would any other liquid soap.  You can apply chg directly to the skin and wash.  Gently with a scrungie or clean washcloth.  5.  Apply the CHG Soap to your body ONLY FROM THE NECK DOWN.   Do   not use on face/ open                           Wound or open sores. Avoid contact with eyes, ears mouth and   genitals (private parts).                       Wash face,  Genitals (private parts) with your normal soap.             6.  Wash thoroughly, paying special attention to the area where your    surgery  will be  performed.  7.  Thoroughly rinse your body with warm water from the neck down.  8.  DO NOT shower/wash with your normal soap  after using and rinsing off the CHG Soap.                9.  Pat yourself dry with a clean towel.            10.  Wear clean pajamas.            11.  Place clean sheets on your bed the night of your first shower and do not  sleep with pets. Day of Surgery : Do not apply any lotions/deodorants the morning of surgery.  Please wear clean clothes to the hospital/surgery center.  FAILURE TO FOLLOW THESE INSTRUCTIONS MAY RESULT IN THE CANCELLATION OF YOUR SURGERY  PATIENT SIGNATURE_________________________________  NURSE SIGNATURE__________________________________  ________________________________________________________________________   Adam Phenix  An incentive spirometer is a tool that can help keep your lungs clear and active. This tool measures how well you are filling your lungs with each breath. Taking long deep breaths may help reverse or decrease the chance of developing breathing (pulmonary) problems (especially infection) following: A long period of time when you are unable to move or be active. BEFORE THE PROCEDURE  If the spirometer includes an indicator to show your best effort, your nurse or respiratory therapist will set it to a desired goal. If possible, sit up straight or lean slightly forward. Try not to slouch. Hold the incentive spirometer in an upright position. INSTRUCTIONS FOR USE  Sit on the edge of your bed if possible, or sit up as far as you can in bed or on a chair. Hold the incentive spirometer in an upright position. Breathe out normally. Place the mouthpiece in your mouth and seal your lips tightly around it. Breathe in slowly and as deeply as possible, raising the piston or the ball toward the top of the column. Hold your breath for 3-5 seconds or for as long as possible. Allow the piston or ball to fall to the bottom of the  column. Remove the mouthpiece from your mouth and breathe out normally. Rest for a few seconds and repeat Steps 1 through 7 at least 10 times every 1-2 hours when you are awake. Take your time and take a few normal breaths between deep breaths. The spirometer may include an indicator to show your best effort. Use the indicator as a goal to work toward during each repetition. After each set of 10 deep breaths, practice coughing to be sure your lungs are clear. If you have an incision (the cut made at the time of surgery), support your incision when coughing by placing a pillow or rolled up towels firmly against it. Once you are able to get out of bed, walk around indoors and cough well. You may stop using the incentive spirometer when instructed by your caregiver.  RISKS AND COMPLICATIONS Take your time so you do not get dizzy or light-headed. If you are in pain, you may need to take or ask for pain medication before doing incentive spirometry. It is harder to take a deep breath if you are having pain. AFTER USE Rest and breathe slowly and easily. It can be helpful to keep track of a log of your progress. Your caregiver can provide you with a simple table to help with this. If you are using the spirometer at home, follow these instructions: [place] IF:  You are having difficultly using the spirometer. You have trouble using the spirometer as often as instructed. Your pain medication is not giving enough relief while using the spirometer.  You develop fever of 100.5 F (38.1 C) or higher. SEEK IMMEDIATE MEDICAL CARE IF:  You cough up bloody sputum that had not been present before. You develop fever of 102 F (38.9 C) or greater. You develop worsening pain at or near the incision site. MAKE SURE YOU:  Understand these instructions. Will watch your condition. Will get help right away if you are not doing well or get worse. Document Released: 02/18/2007 Document Revised: 12/31/2011  Document Reviewed: 04/21/2007 ExitCare Patient Information 2014 [place].   ________________________________________________________________________  [facility]- Preparing for Total Shoulder Arthroplasty    Before surgery, you can play an important role. Because skin is not sterile, your skin needs to be as free of germs as possible. You can reduce the number of germs on your skin by using the following products. Benzoyl Peroxide Gel Reduces the number of germs present on the skin Applied twice a day to shoulder area starting two days before surgery    ==================================================================  Please follow these instructions carefully:  BENZOYL PEROXIDE 5% GEL  Please do not use if you have an allergy to benzoyl peroxide.   If your skin becomes reddened/irritated stop using the benzoyl peroxide.  Starting two days before surgery, apply as follows: Apply benzoyl peroxide in the morning and at night. Apply after taking a shower. If you are not taking a shower clean entire shoulder front, back, and side along with the armpit with a clean wet washcloth.  Place a quarter-sized dollop on your shoulder and rub in thoroughly, making sure to cover the front, back, and side of your shoulder, along with the armpit.   2 days before ____ AM   ____ PM              1 day before ____ AM   ____ PM                         Do this twice a day for two days.  (Last application is the night before surgery, AFTER using the CHG soap as described below).  Do NOT apply benzoyl peroxide gel on the day of surgery.

## 2021-10-20 ENCOUNTER — Encounter (HOSPITAL_COMMUNITY): Payer: Self-pay

## 2021-10-20 ENCOUNTER — Encounter (HOSPITAL_COMMUNITY)
Admission: RE | Admit: 2021-10-20 | Discharge: 2021-10-20 | Disposition: A | Discharge status: Home / Self Care | Payer: Medicare Other | Source: Ambulatory Visit | Attending: Orthopedic Surgery | Admitting: Orthopedic Surgery

## 2021-10-20 ENCOUNTER — Other Ambulatory Visit: Payer: Self-pay

## 2021-10-20 VITALS — BP 125/70 | HR 72 | Temp 98.2°F | Resp 18 | Ht 64.5 in | Wt 196.4 lb

## 2021-10-20 DIAGNOSIS — J45909 Unspecified asthma, uncomplicated: Secondary | ICD-10-CM | POA: Insufficient documentation

## 2021-10-20 DIAGNOSIS — K760 Fatty (change of) liver, not elsewhere classified: Secondary | ICD-10-CM | POA: Insufficient documentation

## 2021-10-20 DIAGNOSIS — K769 Liver disease, unspecified: Secondary | ICD-10-CM | POA: Diagnosis not present

## 2021-10-20 DIAGNOSIS — R7303 Prediabetes: Secondary | ICD-10-CM | POA: Diagnosis not present

## 2021-10-20 DIAGNOSIS — Z01812 Encounter for preprocedural laboratory examination: Secondary | ICD-10-CM | POA: Insufficient documentation

## 2021-10-20 DIAGNOSIS — I471 Supraventricular tachycardia: Secondary | ICD-10-CM | POA: Insufficient documentation

## 2021-10-20 DIAGNOSIS — M19011 Primary osteoarthritis, right shoulder: Secondary | ICD-10-CM | POA: Diagnosis not present

## 2021-10-20 DIAGNOSIS — Z952 Presence of prosthetic heart valve: Secondary | ICD-10-CM | POA: Diagnosis not present

## 2021-10-20 DIAGNOSIS — I10 Essential (primary) hypertension: Secondary | ICD-10-CM | POA: Insufficient documentation

## 2021-10-20 DIAGNOSIS — I2510 Atherosclerotic heart disease of native coronary artery without angina pectoris: Secondary | ICD-10-CM | POA: Insufficient documentation

## 2021-10-20 DIAGNOSIS — Z01818 Encounter for other preprocedural examination: Secondary | ICD-10-CM

## 2021-10-20 LAB — CBC
HCT: 38.9 % (ref 36.0–46.0)
Hemoglobin: 12.4 g/dL (ref 12.0–15.0)
MCH: 28.6 pg (ref 26.0–34.0)
MCHC: 31.9 g/dL (ref 30.0–36.0)
MCV: 89.8 fL (ref 80.0–100.0)
Platelets: 217 10*3/uL (ref 150–400)
RBC: 4.33 MIL/uL (ref 3.87–5.11)
RDW: 13.7 % (ref 11.5–15.5)
WBC: 5.8 10*3/uL (ref 4.0–10.5)
nRBC: 0 % (ref 0.0–0.2)

## 2021-10-20 LAB — SURGICAL PCR SCREEN
MRSA, PCR: NEGATIVE
Staphylococcus aureus: NEGATIVE

## 2021-10-20 LAB — COMPREHENSIVE METABOLIC PANEL
ALT: 22 U/L (ref 0–44)
AST: 22 U/L (ref 15–41)
Albumin: 3.6 g/dL (ref 3.5–5.0)
Alkaline Phosphatase: 50 U/L (ref 38–126)
Anion gap: 6 (ref 5–15)
BUN: 18 mg/dL (ref 8–23)
CO2: 26 mmol/L (ref 22–32)
Calcium: 8.8 mg/dL — ABNORMAL LOW (ref 8.9–10.3)
Chloride: 101 mmol/L (ref 98–111)
Creatinine, Ser: 0.56 mg/dL (ref 0.44–1.00)
GFR, Estimated: >60 mL/min (ref >60)
Glucose, Bld: 87 mg/dL (ref 70–99)
Potassium: 4.2 mmol/L (ref 3.5–5.1)
Sodium: 133 mmol/L — ABNORMAL LOW (ref 135–145)
Total Bilirubin: 0.9 mg/dL (ref 0.3–1.2)
Total Protein: 6.8 g/dL (ref 6.5–8.1)

## 2021-10-20 LAB — HEMOGLOBIN A1C
Hgb A1c MFr Bld: 6.1 % — ABNORMAL HIGH (ref 4.8–5.6)
Mean Plasma Glucose: 128.37 mg/dL

## 2021-10-20 LAB — GLUCOSE, CAPILLARY: Glucose-Capillary: 73 mg/dL (ref 70–99)

## 2021-10-24 ENCOUNTER — Other Ambulatory Visit: Payer: Self-pay

## 2021-10-24 ENCOUNTER — Telehealth: Payer: Self-pay | Admitting: Family Medicine

## 2021-10-24 ENCOUNTER — Encounter (INDEPENDENT_AMBULATORY_CARE_PROVIDER_SITE_OTHER): Payer: Self-pay | Admitting: Family Medicine

## 2021-10-24 ENCOUNTER — Ambulatory Visit (INDEPENDENT_AMBULATORY_CARE_PROVIDER_SITE_OTHER): Payer: Medicare Other | Admitting: Family Medicine

## 2021-10-24 VITALS — BP 112/69 | HR 73 | Temp 97.4°F | Ht 64.0 in | Wt 183.0 lb

## 2021-10-24 DIAGNOSIS — Z6841 Body Mass Index (BMI) 40.0 and over, adult: Secondary | ICD-10-CM | POA: Diagnosis not present

## 2021-10-24 DIAGNOSIS — R7303 Prediabetes: Secondary | ICD-10-CM

## 2021-10-24 DIAGNOSIS — M19011 Primary osteoarthritis, right shoulder: Secondary | ICD-10-CM | POA: Diagnosis not present

## 2021-10-24 NOTE — Progress Notes (Signed)
Chief Complaint:   OBESITY Natalie Hall is here to discuss her progress with her obesity treatment plan along with follow-up of her obesity related diagnoses. Natalie Hall is on the Category 2 Plan and states she is following her eating plan approximately 50% of the time. Natalie Hall states she is doing 0 minutes 0 times per week.  Today's visit was #: 5 Starting weight: 213 lbs Starting date: 08/04/2018 Today's weight: 183 lbs Today's date: 10/24/2021 Total lbs lost to date: 30 lbs Total lbs lost since last in-office visit: 0  Interim History: Natalie Hall was off plan over Christmas and is working on getting back on plan. Her low weight was 177 lbs (12/26/20). She is up 6 lbs from low weight of 177 lbs. She wants to lose more weight but has mostly maintained her weight for last few years.   Subjective:   1. Osteoarthritis of right shoulder, unspecified osteoarthritis type Natalie Hall's right shoulder arthroplasty is planned for 11-03-2021. She did very well with left shoulder arthroplasty a few years ago.  2. Pre-diabetes Natalie Hall's last A1C was elevated at 5.9. She is on Metformin 500 mg once daily.  Lab Results  Component Value Date   HGBA1C 6.1 (H) 10/20/2021   Lab Results  Component Value Date   INSULIN 16.6 12/06/2020   INSULIN 18.5 11/17/2019   INSULIN 15.4 11/20/2018   INSULIN 33.5 (H) 08/04/2018    Assessment/Plan:   1. Osteoarthritis of right shoulder, unspecified osteoarthritis type Natalie Hall will follow up with Orthopedic. She will resume Silver Sneakers after recovery from surgery.  2. Pre-diabetes Natalie Hall will continue taking Metformin. She will continue to work on weight loss, exercise, and decreasing simple carbohydrates to help decrease the risk of diabetes.   3. Obesity: Current BMI 31.65 Natalie Hall is currently in the action stage of change. As such, her goal is to continue with weight loss efforts. She has agreed to the Category 2 Plan.   Exercise goals:  Natalie Hall will resume Silver  Sneakers after recovering from right shoulder surgery.   Behavioral modification strategies: increasing lean protein intake and decreasing simple carbohydrates.  Natalie Hall has agreed to follow-up with our clinic in 4-8 weeks. She will call for an appointment.   Objective:   Blood pressure 112/69, pulse 73, temperature (!) 97.4 F (36.3 C), height 5\' 4"  (1.626 m), weight 183 lb (83 kg), SpO2 97 %. Body mass index is 31.41 kg/m.  General: Cooperative, alert, well developed, in no acute distress. HEENT: Conjunctivae and lids unremarkable. Cardiovascular: Regular rhythm.  Lungs: Normal work of breathing. Neurologic: No focal deficits.   Lab Results  Component Value Date   CREATININE 0.56 10/20/2021   BUN 18 10/20/2021   NA 133 (L) 10/20/2021   K 4.2 10/20/2021   CL 101 10/20/2021   CO2 26 10/20/2021   Lab Results  Component Value Date   ALT 22 10/20/2021   AST 22 10/20/2021   ALKPHOS 50 10/20/2021   BILITOT 0.9 10/20/2021   Lab Results  Component Value Date   HGBA1C 6.1 (H) 10/20/2021   HGBA1C 5.9 04/13/2021   HGBA1C 5.5 12/06/2020   HGBA1C 6.0 03/31/2020   HGBA1C 5.6 11/17/2019   Lab Results  Component Value Date   INSULIN 16.6 12/06/2020   INSULIN 18.5 11/17/2019   INSULIN 15.4 11/20/2018   INSULIN 33.5 (H) 08/04/2018   Lab Results  Component Value Date   TSH 1.31 04/13/2021   Lab Results  Component Value Date   CHOL 111 04/13/2021  HDL 28.80 (L) 04/13/2021   LDLCALC 50 04/13/2021   LDLDIRECT 96.0 03/11/2018   TRIG 164.0 (H) 04/13/2021   CHOLHDL 4 04/13/2021   Lab Results  Component Value Date   VD25OH 51.1 12/06/2020   VD25OH 105.10 (HH) 03/31/2020   VD25OH 69.1 11/17/2019   Lab Results  Component Value Date   WBC 5.8 10/20/2021   HGB 12.4 10/20/2021   HCT 38.9 10/20/2021   MCV 89.8 10/20/2021   PLT 217 10/20/2021   No results found for: IRON, TIBC, FERRITIN  Attestation Statements:   Reviewed by clinician on day of visit: allergies,  medications, problem list, medical history, surgical history, family history, social history, and previous encounter notes.  I, Lizbeth Bark, RMA, am acting as Location manager for [employer], [place].  I have reviewed the above documentation for accuracy and completeness, and I agree with the above. -  Georgianne Fick, FNP

## 2021-10-24 NOTE — Anesthesia Preprocedure Evaluation (Addendum)
Anesthesia Evaluation  Patient identified by MRN, date of birth, ID band Patient awake    Reviewed: Allergy & Precautions, NPO status , Patient's Chart, lab work & pertinent test results  Airway Mallampati: II  TM Distance: >3 FB Neck ROM: Full    Dental no notable dental hx. (+) Implants, Teeth Intact, Dental Advisory Given   Pulmonary asthma ,    Pulmonary exam normal breath sounds clear to auscultation       Cardiovascular hypertension, + CAD (DES 2012) and + Past MI  Normal cardiovascular exam+ dysrhythmias (S/P ablation for SVT 2017)  Rhythm:Regular Rate:Normal  NL LV 10/09/21 EKG SR R 62   Neuro/Psych PSYCHIATRIC DISORDERS Depression negative neurological ROS     GI/Hepatic Neg liver ROS, hiatal hernia, GERD  ,  Endo/Other    Renal/GU Lab Results      Component                Value               Date                      CREATININE               0.56                10/20/2021                BUN                      18                  10/20/2021                NA                       133 (L)             10/20/2021                K                        4.2                 10/20/2021                      negative genitourinary   Musculoskeletal  (+) Arthritis ,   Abdominal (+) + obese (BMI 31.4),   Peds  Hematology Lab Results      Component                Value               Date                      WBC                      5.8                 10/20/2021                HGB                      12.4                10/20/2021  HCT                      38.9                10/20/2021                MCV                      89.8                10/20/2021                PLT                      217                 10/20/2021              Anesthesia Other Findings All: Codeine, Levaquin, Amoxicillin  Reproductive/Obstetrics                           Anesthesia  Physical Anesthesia Plan  ASA: 3  Anesthesia Plan: Regional and General   Post-op Pain Management: Regional block and Minimal or no pain anticipated   Induction: Intravenous  PONV Risk Score and Plan: 4 or greater and Treatment may vary due to age or medical condition, Midazolam and Ondansetron  Airway Management Planned: Oral ETT  Additional Equipment: None  Intra-op Plan:   Post-operative Plan: Extubation in OR  Informed Consent:     Dental advisory given  Plan Discussed with:   Anesthesia Plan Comments: (See APP note by Durel Salts, FNP   GA w R ISB exparel)     Anesthesia Quick Evaluation

## 2021-10-24 NOTE — Telephone Encounter (Signed)
Spoke with patient to schedule Medicare Annual Wellness Visit (AWV) either virtually or in office.   Patient wanted a call back mid feb she is having shoulder surgery  Last AWV 10/03/20 please schedule at anytime with [facility] Nurse Health Advisor 1 or 2   This should be a 45 minute visit.

## 2021-10-24 NOTE — Progress Notes (Signed)
Anesthesia Chart Review:   Case: [id number] Date/Time: 11/03/21 0715   Procedure: REVERSE SHOULDER ARTHROPLASTY (Right: Shoulder) - with ISB   Anesthesia type: General   Pre-op diagnosis: right shoulder osteoarthritis end stage   Location: [facility] 06 / [facility]   Surgeons: Netta Cedars, MD       DISCUSSION: Pt is 71 years old with hx CAD (s/p DES to RCA 2017) SVT (s/p ablation 2017), HTN, pre-diabetes, asthma, fatty liver  VS: BP 125/70    Pulse 72    Temp 36.8 C (Oral)    Resp 18    Ht 5' 4.5" (1.638 m)    Wt 89.1 kg    LMP  (LMP Unknown)    SpO2 99%    BMI 33.20 kg/m   PROVIDERS: - PCP is Laurey Morale, MD - Cardiologist is Lyman Bishop, MD. Cleared for surgery by Nicholes Rough, PA at last office visit on 10/09/21   LABS: Labs reviewed: Acceptable for surgery. (all labs ordered are listed, but only abnormal results are displayed)  Labs Reviewed  HEMOGLOBIN A1C - Abnormal; Notable for the following components:      Result Value   Hgb A1c MFr Bld 6.1 (*)    All other components within normal limits  COMPREHENSIVE METABOLIC PANEL - Abnormal; Notable for the following components:   Sodium 133 (*)    Calcium 8.8 (*)    All other components within normal limits  SURGICAL PCR SCREEN  CBC  GLUCOSE, CAPILLARY     IMAGES: CXR 04/14/21:  - No acute cardiopulmonary disease. No evidence of metastatic disease. - Aortic Atherosclerosis    EKG 10/09/21: NSR. Low voltage QRS. Septal infarct, age undetermined.    CV: Cardiac cath 03/14/16:  Prox RCA lesion, 25% stenosed. The left ventricular systolic function is normal. Prox LAD to Mid LAD lesion, 15% stenosed. Mid RCA-1 lesion, 95% stenosed. Mid RCA-2 lesion, 95% stenosed. Post intervention, there is a 0% residual stenosis. 1. Single vessel obstructive CAD 2. Normal LV function 3. Successful stenting of the mid to distal RCA with a DES   Past Medical History:  Diagnosis Date   Asthma    infrequent problem - no treatment  since 2006   Back pain    Choroidal nevus of left eye    sees Dr. Gerarda Fraction at [facility].    Constipation    Coronary artery disease    Dysrhythmia    SVT - ablation by Dr. Curt Bears on 05/21/2016 and episodes since   Fatty liver    GERD (gastroesophageal reflux disease)    History of hiatal hernia    "very small"   Hx of colonic polyp    Hyperlipidemia    Hypertension    Joint pain    Melanoma of eye, left ([place])    sees Dr. Cordelia Pen   Myocardial infarction [facility])    Osteoarthritis    Osteoporosis    osteopenia   Shortness of breath    Supraventricular tachycardia ([place])     Past Surgical History:  Procedure Laterality Date   ABDOMINAL HYSTERECTOMY     ACHILLES TENDON REPAIR  10-07-11   torn on right, per Dr. Marge Duncans SURGERY     torn tendon left ankle   BREAST BIOPSY     CARDIAC CATHETERIZATION N/A 03/14/2016   Procedure: Left Heart Cath and Coronary Angiography;  Surgeon: Peter M Martinique, MD;  Location: [facility];  Service: Cardiovascular;  Laterality: N/A;  CARDIAC CATHETERIZATION N/A 03/14/2016   Procedure: Coronary Stent Intervention;  Surgeon: Peter M Martinique, MD;  Location: [facility];  Service: Cardiovascular;  Laterality: N/A;   CARPAL TUNNEL RELEASE     CATARACT EXTRACTION W/ INTRAOCULAR LENS IMPLANT Bilateral    CHOLECYSTECTOMY     COLONOSCOPY  06/23/2014   per Dr. Ardis Hughs, adenomatous polyps, repeat in 7 yrs     ELECTROPHYSIOLOGIC STUDY N/A 05/21/2016   Procedure: SVT Ablation;  Surgeon: [name], MD;  Location: [facility] [facility];  Service: Cardiovascular;  Laterality: N/A;   EXCISION HAGLUND'S DEFORMITY WITH ACHILLES TENDON REPAIR Left 07/02/2019   Procedure: Left Achilles tendon debridement and reconstruction and excision of Haglund deformity;  Surgeon: Wylene Simmer, MD;  Location: [facility];  Service: Orthopedics;  Laterality: Left;   EYE SURGERY Left 08/2016   GASTROCNEMIUS RECESSION Left 07/02/2019    Procedure: Gastroc recession;  Surgeon: Wylene Simmer, MD;  Location: [facility];  Service: Orthopedics;  Laterality: Left;   IR KYPHO THORACIC WITH BONE BIOPSY  12/18/2018   IR RADIOLOGIST EVAL & MGMT  12/09/2018   IR RADIOLOGIST EVAL & MGMT  04/30/2019   KNEE CLOSED REDUCTION Left 02/03/2014   Procedure: CLOSED MANIPULATION LEFT KNEE;  Surgeon: Gearlean Alf, MD;  [facility];  Service: Orthopedics;  Laterality: Left;   POLYPECTOMY     REVERSE SHOULDER ARTHROPLASTY     Left   REVERSE SHOULDER ARTHROPLASTY Left 06/28/2017   Procedure: LEFT SHOULDER REVERSE SHOULDER ARTHROPLASTY;  Surgeon: Netta Cedars, MD;  Location: [facility];  Service: Orthopedics;  Laterality: Left;   TOTAL KNEE ARTHROPLASTY Left 12/21/2013   Procedure: LEFT TOTAL KNEE ARTHROPLASTY;  Surgeon: Gearlean Alf, MD;  [facility];  Service: Orthopedics;  Laterality: Left;   VARICOSE VEIN SURGERY      MEDICATIONS:  acetaminophen (TYLENOL) 500 MG tablet   albuterol (VENTOLIN HFA) 108 (90 Base) MCG/ACT inhaler   aspirin EC 81 MG tablet   Calcium Carb-Cholecalciferol 600-800 MG-UNIT TABS   cetirizine (ZYRTEC) 10 MG tablet   Cholecalciferol (VITAMIN D) 50 MCG (2000 UT) CAPS   cyclobenzaprine (FLEXERIL) 10 MG tablet   ezetimibe (ZETIA) 10 MG tablet   metFORMIN (GLUCOPHAGE) 500 MG tablet   metoprolol succinate (TOPROL-XL) 25 MG 24 hr tablet   Multiple Vitamins-Minerals (MULTIVITAL PO)   omeprazole (PRILOSEC) 40 MG capsule   ramipril (ALTACE) 10 MG capsule   rosuvastatin (CRESTOR) 40 MG tablet   sodium chloride (OCEAN) 0.65 % SOLN nasal spray   temazepam (RESTORIL) 15 MG capsule   traMADol (ULTRAM) 50 MG tablet   No current facility-administered medications for this encounter.    If no changes, I anticipate pt can proceed with surgery as scheduled.   Willeen Cass, PhD, FNP-BC [facility] Short Stay Surgical Center/Anesthesiology Phone: [phone] 10/24/2021 3:36 PM

## 2021-11-01 ENCOUNTER — Encounter (HOSPITAL_COMMUNITY)
Admission: RE | Admit: 2021-11-01 | Discharge: 2021-11-01 | Disposition: A | Discharge status: Home / Self Care | Payer: Medicare Other | Source: Ambulatory Visit | Attending: Orthopedic Surgery | Admitting: Orthopedic Surgery

## 2021-11-01 ENCOUNTER — Other Ambulatory Visit: Payer: Self-pay

## 2021-11-01 DIAGNOSIS — Z01812 Encounter for preprocedural laboratory examination: Secondary | ICD-10-CM | POA: Insufficient documentation

## 2021-11-01 DIAGNOSIS — Z20822 Contact with and (suspected) exposure to covid-19: Secondary | ICD-10-CM | POA: Insufficient documentation

## 2021-11-01 LAB — SARS CORONAVIRUS 2 (TAT 6-24 HRS): SARS Coronavirus 2: NEGATIVE

## 2021-11-03 ENCOUNTER — Observation Stay (HOSPITAL_COMMUNITY): Payer: Medicare Other

## 2021-11-03 ENCOUNTER — Encounter (HOSPITAL_COMMUNITY)
Admission: RE | Disposition: A | Discharge status: Home / Self Care | Payer: Self-pay | Source: Ambulatory Visit | Attending: Orthopedic Surgery

## 2021-11-03 ENCOUNTER — Ambulatory Visit (HOSPITAL_COMMUNITY): Payer: Medicare Other | Admitting: Anesthesiology

## 2021-11-03 ENCOUNTER — Observation Stay (HOSPITAL_COMMUNITY)
Admission: RE | Admit: 2021-11-03 | Discharge: 2021-11-04 | Disposition: A | Discharge status: Home / Self Care | Payer: Medicare Other | Source: Ambulatory Visit | Attending: Orthopedic Surgery | Admitting: Orthopedic Surgery

## 2021-11-03 ENCOUNTER — Ambulatory Visit (HOSPITAL_COMMUNITY): Payer: Medicare Other | Admitting: Emergency Medicine

## 2021-11-03 ENCOUNTER — Encounter (HOSPITAL_COMMUNITY): Payer: Self-pay | Admitting: Orthopedic Surgery

## 2021-11-03 ENCOUNTER — Other Ambulatory Visit: Payer: Self-pay

## 2021-11-03 DIAGNOSIS — Z96611 Presence of right artificial shoulder joint: Secondary | ICD-10-CM

## 2021-11-03 DIAGNOSIS — Z79899 Other long term (current) drug therapy: Secondary | ICD-10-CM | POA: Insufficient documentation

## 2021-11-03 DIAGNOSIS — I10 Essential (primary) hypertension: Secondary | ICD-10-CM | POA: Insufficient documentation

## 2021-11-03 DIAGNOSIS — Z96652 Presence of left artificial knee joint: Secondary | ICD-10-CM | POA: Insufficient documentation

## 2021-11-03 DIAGNOSIS — Z96612 Presence of left artificial shoulder joint: Secondary | ICD-10-CM | POA: Insufficient documentation

## 2021-11-03 DIAGNOSIS — J45909 Unspecified asthma, uncomplicated: Secondary | ICD-10-CM | POA: Diagnosis not present

## 2021-11-03 DIAGNOSIS — Z7982 Long term (current) use of aspirin: Secondary | ICD-10-CM | POA: Insufficient documentation

## 2021-11-03 DIAGNOSIS — M19011 Primary osteoarthritis, right shoulder: Principal | ICD-10-CM | POA: Insufficient documentation

## 2021-11-03 DIAGNOSIS — I2510 Atherosclerotic heart disease of native coronary artery without angina pectoris: Secondary | ICD-10-CM | POA: Diagnosis not present

## 2021-11-03 HISTORY — PX: REVERSE SHOULDER ARTHROPLASTY: SHX5054

## 2021-11-03 LAB — GLUCOSE, CAPILLARY: Glucose-Capillary: 87 mg/dL (ref 70–99)

## 2021-11-03 SURGERY — ARTHROPLASTY, SHOULDER, TOTAL, REVERSE
Anesthesia: Regional | Site: Shoulder | Laterality: Right

## 2021-11-03 MED ORDER — CYCLOBENZAPRINE HCL 10 MG PO TABS: 10.0000 mg | ORAL_TABLET | Freq: Three times a day (TID) | ORAL | Status: DC | PRN

## 2021-11-03 MED ORDER — SODIUM CHLORIDE 0.9 % IV SOLN: INTRAVENOUS | Status: DC

## 2021-11-03 MED ORDER — 0.9 % SODIUM CHLORIDE (POUR BTL) OPTIME
TOPICAL | Status: DC | PRN
  Administered 2021-11-03: 1000 mL

## 2021-11-03 MED ORDER — ORAL CARE MOUTH RINSE: 15.0000 mL | Freq: Once | OROMUCOSAL | Status: AC

## 2021-11-03 MED ORDER — ONDANSETRON HCL 4 MG/2ML IJ SOLN
INTRAMUSCULAR | Status: DC | PRN
Start: 2021-11-03 — End: 2021-11-03
  Administered 2021-11-03: 4 mg via INTRAVENOUS

## 2021-11-03 MED ORDER — DEXAMETHASONE SODIUM PHOSPHATE 10 MG/ML IJ SOLN
INTRAMUSCULAR | Status: DC | PRN
  Administered 2021-11-03: 5 mg via INTRAVENOUS

## 2021-11-03 MED ORDER — POLYETHYLENE GLYCOL 3350 17 G PO PACK: 17.0000 g | PACK | Freq: Every day | ORAL | Status: DC | PRN

## 2021-11-03 MED ORDER — PROPOFOL 10 MG/ML IV BOLUS
INTRAVENOUS | Status: AC
  Filled 2021-11-03: qty 20

## 2021-11-03 MED ORDER — PROPOFOL 10 MG/ML IV BOLUS
INTRAVENOUS | Status: DC | PRN
  Administered 2021-11-03: 140 mg via INTRAVENOUS

## 2021-11-03 MED ORDER — LORATADINE 10 MG PO TABS
10.0000 mg | ORAL_TABLET | Freq: Every day | ORAL | Status: DC
  Administered 2021-11-04: 10 mg via ORAL
  Filled 2021-11-03: qty 1

## 2021-11-03 MED ORDER — RAMIPRIL 10 MG PO CAPS
10.0000 mg | ORAL_CAPSULE | Freq: Every day | ORAL | Status: DC
  Administered 2021-11-04: 10 mg via ORAL
  Filled 2021-11-03: qty 1

## 2021-11-03 MED ORDER — FENTANYL CITRATE PF 50 MCG/ML IJ SOSY: 25.0000 ug | PREFILLED_SYRINGE | INTRAMUSCULAR | Status: DC | PRN

## 2021-11-03 MED ORDER — ACETAMINOPHEN 500 MG PO TABS: 1000.0000 mg | ORAL_TABLET | Freq: Four times a day (QID) | ORAL | Status: DC | PRN

## 2021-11-03 MED ORDER — STERILE WATER FOR IRRIGATION IR SOLN
Status: DC | PRN
  Administered 2021-11-03: 2000 mL

## 2021-11-03 MED ORDER — METHOCARBAMOL 500 MG PO TABS
500.0000 mg | ORAL_TABLET | Freq: Four times a day (QID) | ORAL | Status: DC | PRN
  Administered 2021-11-03 – 2021-11-04 (×2): 500 mg via ORAL
  Filled 2021-11-03 (×2): qty 1

## 2021-11-03 MED ORDER — TEMAZEPAM 15 MG PO CAPS
15.0000 mg | ORAL_CAPSULE | Freq: Every evening | ORAL | Status: DC | PRN
  Administered 2021-11-03: 15 mg via ORAL
  Filled 2021-11-03: qty 1

## 2021-11-03 MED ORDER — METOCLOPRAMIDE HCL 5 MG/ML IJ SOLN: 5.0000 mg | Freq: Three times a day (TID) | INTRAMUSCULAR | Status: DC | PRN

## 2021-11-03 MED ORDER — ROSUVASTATIN CALCIUM 20 MG PO TABS
40.0000 mg | ORAL_TABLET | Freq: Every day | ORAL | Status: DC
  Administered 2021-11-04: 40 mg via ORAL
  Filled 2021-11-03: qty 2

## 2021-11-03 MED ORDER — BUPIVACAINE-EPINEPHRINE (PF) 0.25% -1:200000 IJ SOLN
INTRAMUSCULAR | Status: AC
  Filled 2021-11-03: qty 30

## 2021-11-03 MED ORDER — ASPIRIN EC 81 MG PO TBEC
81.0000 mg | DELAYED_RELEASE_TABLET | Freq: Every day | ORAL | Status: DC
  Administered 2021-11-03 – 2021-11-04 (×2): 81 mg via ORAL
  Filled 2021-11-03 (×2): qty 1

## 2021-11-03 MED ORDER — ONDANSETRON HCL 4 MG PO TABS: 4.0000 mg | ORAL_TABLET | Freq: Four times a day (QID) | ORAL | Status: DC | PRN

## 2021-11-03 MED ORDER — CHLORHEXIDINE GLUCONATE 0.12 % MT SOLN
15.0000 mL | Freq: Once | OROMUCOSAL | Status: AC
  Administered 2021-11-03: 15 mL via OROMUCOSAL

## 2021-11-03 MED ORDER — ACETAMINOPHEN 10 MG/ML IV SOLN: 1000.0000 mg | Freq: Once | INTRAVENOUS | Status: DC | PRN

## 2021-11-03 MED ORDER — EZETIMIBE 10 MG PO TABS
10.0000 mg | ORAL_TABLET | Freq: Every day | ORAL | Status: DC
  Administered 2021-11-04: 10 mg via ORAL
  Filled 2021-11-03: qty 1

## 2021-11-03 MED ORDER — BISACODYL 10 MG RE SUPP: 10.0000 mg | Freq: Every day | RECTAL | Status: DC | PRN

## 2021-11-03 MED ORDER — MORPHINE SULFATE (PF) 2 MG/ML IV SOLN: 0.5000 mg | INTRAVENOUS | Status: DC | PRN

## 2021-11-03 MED ORDER — EPHEDRINE SULFATE-NACL 50-0.9 MG/10ML-% IV SOSY
PREFILLED_SYRINGE | INTRAVENOUS | Status: DC | PRN
  Administered 2021-11-03: 5 mg via INTRAVENOUS
  Administered 2021-11-03 (×2): 10 mg via INTRAVENOUS

## 2021-11-03 MED ORDER — FENTANYL CITRATE (PF) 100 MCG/2ML IJ SOLN
INTRAMUSCULAR | Status: AC
  Filled 2021-11-03: qty 2

## 2021-11-03 MED ORDER — BUPIVACAINE LIPOSOME 1.3 % IJ SUSP
INTRAMUSCULAR | Status: DC | PRN
  Administered 2021-11-03: 10 mL via PERINEURAL

## 2021-11-03 MED ORDER — ALBUTEROL SULFATE HFA 108 (90 BASE) MCG/ACT IN AERS: 2.0000 | INHALATION_SPRAY | RESPIRATORY_TRACT | Status: DC | PRN

## 2021-11-03 MED ORDER — PANTOPRAZOLE SODIUM 40 MG PO TBEC
40.0000 mg | DELAYED_RELEASE_TABLET | Freq: Every day | ORAL | Status: DC
  Administered 2021-11-04: 40 mg via ORAL
  Filled 2021-11-03: qty 1

## 2021-11-03 MED ORDER — CEFAZOLIN SODIUM-DEXTROSE 2-4 GM/100ML-% IV SOLN
2.0000 g | Freq: Four times a day (QID) | INTRAVENOUS | Status: AC
  Administered 2021-11-03 – 2021-11-04 (×3): 2 g via INTRAVENOUS
  Filled 2021-11-03 (×3): qty 100

## 2021-11-03 MED ORDER — MENTHOL 3 MG MT LOZG: 1.0000 | LOZENGE | OROMUCOSAL | Status: DC | PRN

## 2021-11-03 MED ORDER — PHENYLEPHRINE 40 MCG/ML (10ML) SYRINGE FOR IV PUSH (FOR BLOOD PRESSURE SUPPORT)
PREFILLED_SYRINGE | INTRAVENOUS | Status: DC | PRN
  Administered 2021-11-03 (×2): 80 ug via INTRAVENOUS

## 2021-11-03 MED ORDER — PHENYLEPHRINE HCL-NACL 20-0.9 MG/250ML-% IV SOLN
INTRAVENOUS | Status: DC | PRN
  Administered 2021-11-03: 40 ug/min via INTRAVENOUS

## 2021-11-03 MED ORDER — OYSTER SHELL CALCIUM/D3 500-5 MG-MCG PO TABS
2.0000 | ORAL_TABLET | Freq: Every day | ORAL | Status: DC
  Administered 2021-11-04: 2 via ORAL
  Filled 2021-11-03: qty 2

## 2021-11-03 MED ORDER — METOCLOPRAMIDE HCL 5 MG PO TABS: 5.0000 mg | ORAL_TABLET | Freq: Three times a day (TID) | ORAL | Status: DC | PRN

## 2021-11-03 MED ORDER — SALINE SPRAY 0.65 % NA SOLN
1.0000 | NASAL | Status: DC | PRN
  Filled 2021-11-03: qty 44

## 2021-11-03 MED ORDER — METFORMIN HCL 500 MG PO TABS
500.0000 mg | ORAL_TABLET | Freq: Every day | ORAL | Status: DC
  Administered 2021-11-04: 500 mg via ORAL
  Filled 2021-11-03: qty 1

## 2021-11-03 MED ORDER — BUPIVACAINE HCL (PF) 0.5 % IJ SOLN
INTRAMUSCULAR | Status: DC | PRN
  Administered 2021-11-03: 15 mL via PERINEURAL

## 2021-11-03 MED ORDER — LACTATED RINGERS IV SOLN: INTRAVENOUS | Status: DC

## 2021-11-03 MED ORDER — DOCUSATE SODIUM 100 MG PO CAPS
100.0000 mg | ORAL_CAPSULE | Freq: Two times a day (BID) | ORAL | Status: DC
  Administered 2021-11-03 – 2021-11-04 (×2): 100 mg via ORAL
  Filled 2021-11-03 (×2): qty 1

## 2021-11-03 MED ORDER — ONDANSETRON HCL 4 MG/2ML IJ SOLN: 4.0000 mg | Freq: Once | INTRAMUSCULAR | Status: DC | PRN

## 2021-11-03 MED ORDER — TRAMADOL HCL 50 MG PO TABS
50.0000 mg | ORAL_TABLET | Freq: Four times a day (QID) | ORAL | 0 refills | Status: DC | PRN
Start: 2021-11-03 — End: 2022-01-18

## 2021-11-03 MED ORDER — ACETAMINOPHEN 325 MG PO TABS
325.0000 mg | ORAL_TABLET | Freq: Four times a day (QID) | ORAL | Status: DC | PRN
  Administered 2021-11-04: 650 mg via ORAL
  Filled 2021-11-03: qty 2

## 2021-11-03 MED ORDER — PHENYLEPHRINE HCL-NACL 20-0.9 MG/250ML-% IV SOLN
INTRAVENOUS | Status: AC
  Filled 2021-11-03: qty 750

## 2021-11-03 MED ORDER — ONDANSETRON HCL 4 MG/2ML IJ SOLN: 4.0000 mg | Freq: Four times a day (QID) | INTRAMUSCULAR | Status: DC | PRN

## 2021-11-03 MED ORDER — METOPROLOL SUCCINATE ER 25 MG PO TB24
25.0000 mg | ORAL_TABLET | Freq: Two times a day (BID) | ORAL | Status: DC
  Administered 2021-11-03 – 2021-11-04 (×2): 25 mg via ORAL
  Filled 2021-11-03 (×2): qty 1

## 2021-11-03 MED ORDER — PHENOL 1.4 % MT LIQD
1.0000 | OROMUCOSAL | Status: DC | PRN
Start: 2021-11-03 — End: 2021-11-04

## 2021-11-03 MED ORDER — BUPIVACAINE-EPINEPHRINE (PF) 0.25% -1:200000 IJ SOLN
INTRAMUSCULAR | Status: DC | PRN
  Administered 2021-11-03: 13 mL

## 2021-11-03 MED ORDER — FENTANYL CITRATE (PF) 100 MCG/2ML IJ SOLN
INTRAMUSCULAR | Status: DC | PRN
Start: 2021-11-03 — End: 2021-11-03
  Administered 2021-11-03: 75 ug via INTRAVENOUS
  Administered 2021-11-03: 25 ug via INTRAVENOUS

## 2021-11-03 MED ORDER — SUGAMMADEX SODIUM 200 MG/2ML IV SOLN
INTRAVENOUS | Status: DC | PRN
  Administered 2021-11-03: 200 mg via INTRAVENOUS

## 2021-11-03 MED ORDER — CEFAZOLIN SODIUM-DEXTROSE 2-4 GM/100ML-% IV SOLN
2.0000 g | INTRAVENOUS | Status: AC
  Administered 2021-11-03: 2 g via INTRAVENOUS
  Filled 2021-11-03: qty 100

## 2021-11-03 MED ORDER — LIDOCAINE 2% (20 MG/ML) 5 ML SYRINGE
INTRAMUSCULAR | Status: DC | PRN
  Administered 2021-11-03: 100 mg via INTRAVENOUS

## 2021-11-03 MED ORDER — VITAMIN D3 25 MCG (1000 UNIT) PO TABS
2000.0000 [IU] | ORAL_TABLET | Freq: Every day | ORAL | Status: DC
  Administered 2021-11-04: 2000 [IU] via ORAL
  Filled 2021-11-03 (×2): qty 2

## 2021-11-03 MED ORDER — ROCURONIUM BROMIDE 10 MG/ML (PF) SYRINGE
PREFILLED_SYRINGE | INTRAVENOUS | Status: DC | PRN
  Administered 2021-11-03: 10 mg via INTRAVENOUS
  Administered 2021-11-03: 35 mg via INTRAVENOUS

## 2021-11-03 MED ORDER — TRAMADOL HCL 50 MG PO TABS
50.0000 mg | ORAL_TABLET | Freq: Four times a day (QID) | ORAL | Status: DC | PRN
  Administered 2021-11-04: 50 mg via ORAL
  Filled 2021-11-03 (×2): qty 1

## 2021-11-03 MED ORDER — METHOCARBAMOL 500 MG IVPB - SIMPLE MED
500.0000 mg | Freq: Four times a day (QID) | INTRAVENOUS | Status: DC | PRN
  Filled 2021-11-03: qty 50

## 2021-11-03 SURGICAL SUPPLY — 73 items
AID PSTN UNV HD RSTRNT DISP (MISCELLANEOUS) ×1
BAG COUNTER SPONGE SURGICOUNT (BAG) IMPLANT
BAG SPEC THK2 15X12 ZIP CLS (MISCELLANEOUS)
BAG SPNG CNTER NS LX DISP (BAG)
BAG ZIPLOCK 12X15 (MISCELLANEOUS) IMPLANT
BIT DRILL 1.6MX128 (BIT) ×1 IMPLANT
BIT DRILL 170X2.5X (BIT) IMPLANT
BIT DRL 170X2.5X (BIT) ×1
BLADE SAG 18X100X1.27 (BLADE) ×2 IMPLANT
COVER BACK TABLE 60X90IN (DRAPES) ×2 IMPLANT
COVER SURGICAL LIGHT HANDLE (MISCELLANEOUS) ×2 IMPLANT
DECANTER SPIKE VIAL GLASS SM (MISCELLANEOUS) ×2 IMPLANT
DRAPE INCISE IOBAN 66X45 STRL (DRAPES) ×2 IMPLANT
DRAPE ORTHO SPLIT 77X108 STRL (DRAPES) ×4
DRAPE SHEET LG 3/4 BI-LAMINATE (DRAPES) ×2 IMPLANT
DRAPE SURG ORHT 6 SPLT 77X108 (DRAPES) ×2 IMPLANT
DRAPE TOP 10253 STERILE (DRAPES) ×2 IMPLANT
DRAPE U-SHAPE 47X51 STRL (DRAPES) ×2 IMPLANT
DRILL 2.5 (BIT) ×2
DRSG ADAPTIC 3X8 NADH LF (GAUZE/BANDAGES/DRESSINGS) ×2 IMPLANT
DRSG PAD ABDOMINAL 8X10 ST (GAUZE/BANDAGES/DRESSINGS) ×2 IMPLANT
DURAPREP 26ML APPLICATOR (WOUND CARE) ×2 IMPLANT
ECCENTRIC EPIPHYSI MODULAR SZ1 (Trauma) IMPLANT
ELECT BLADE TIP CTD 4 INCH (ELECTRODE) ×2 IMPLANT
ELECT NDL TIP 2.8 STRL (NEEDLE) ×1 IMPLANT
ELECT NEEDLE TIP 2.8 STRL (NEEDLE) ×2 IMPLANT
ELECT REM PT RETURN 15FT ADLT (MISCELLANEOUS) ×2 IMPLANT
FACESHIELD WRAPAROUND (MASK) ×2 IMPLANT
FACESHIELD WRAPAROUND OR TEAM (MASK) ×1 IMPLANT
GAUZE SPONGE 4X4 12PLY STRL (GAUZE/BANDAGES/DRESSINGS) ×2 IMPLANT
GLENOSPHERE DELTA XTEND LAT 38 (Miscellaneous) ×1 IMPLANT
GLOVE SURG ORTHO LTX SZ7.5 (GLOVE) ×2 IMPLANT
GLOVE SURG ORTHO LTX SZ8.5 (GLOVE) ×2 IMPLANT
GLOVE SURG UNDER POLY LF SZ7.5 (GLOVE) ×2 IMPLANT
GLOVE SURG UNDER POLY LF SZ8.5 (GLOVE) ×2 IMPLANT
GOWN STRL REUS W/TWL XL LVL3 (GOWN DISPOSABLE) ×4 IMPLANT
KIT BASIN OR (CUSTOM PROCEDURE TRAY) ×2 IMPLANT
KIT TURNOVER KIT A (KITS) IMPLANT
MANIFOLD NEPTUNE II (INSTRUMENTS) ×2 IMPLANT
METAGLENE DELTA EXTEND (Trauma) IMPLANT
METAGLENE DXTEND (Trauma) ×2 IMPLANT
MODULAR ECCENTRIC EPIPHYSI SZ1 (Trauma) ×2 IMPLANT
NDL MAYO CATGUT SZ4 TPR NDL (NEEDLE) IMPLANT
NEEDLE MAYO CATGUT SZ4 (NEEDLE) ×2 IMPLANT
NS IRRIG 1000ML POUR BTL (IV SOLUTION) ×2 IMPLANT
PACK SHOULDER (CUSTOM PROCEDURE TRAY) ×2 IMPLANT
PIN GUIDE 1.2 (PIN) ×1 IMPLANT
PIN GUIDE GLENOPHERE 1.5MX300M (PIN) ×1 IMPLANT
PIN METAGLENE 2.5 (PIN) ×1 IMPLANT
PROTECTOR NERVE ULNAR (MISCELLANEOUS) ×2 IMPLANT
RESTRAINT HEAD UNIVERSAL NS (MISCELLANEOUS) ×2 IMPLANT
SCREW 4.5X18MM (Screw) ×2 IMPLANT
SCREW BN 18X4.5XSTRL SHLDR (Screw) IMPLANT
SCREW LOCK 42 (Screw) ×1 IMPLANT
SCREW LOCK DELTA XTEND 4.5X30 (Screw) ×1 IMPLANT
SLING ARM FOAM STRAP LRG (SOFTGOODS) ×1 IMPLANT
SMARTMIX MINI TOWER (MISCELLANEOUS)
SPACER 38 PLUS 3 (Spacer) ×1 IMPLANT
SPONGE T-LAP 18X18 ~~LOC~~+RFID (SPONGE) ×2 IMPLANT
SPONGE T-LAP 4X18 ~~LOC~~+RFID (SPONGE) ×2 IMPLANT
STEM DELTA DIA 10 HA (Stem) ×1 IMPLANT
STRIP CLOSURE SKIN 1/2X4 (GAUZE/BANDAGES/DRESSINGS) ×2 IMPLANT
SUCTION FRAZIER HANDLE 10FR (MISCELLANEOUS) ×2
SUCTION TUBE FRAZIER 10FR DISP (MISCELLANEOUS) ×1 IMPLANT
SUT FIBERWIRE #2 38 T-5 BLUE (SUTURE) ×8
SUT MNCRL AB 4-0 PS2 18 (SUTURE) ×2 IMPLANT
SUT VIC AB 0 CT1 36 (SUTURE) ×4 IMPLANT
SUT VIC AB 0 CT2 27 (SUTURE) ×2 IMPLANT
SUT VIC AB 2-0 CT1 27 (SUTURE) ×2
SUT VIC AB 2-0 CT1 TAPERPNT 27 (SUTURE) ×1 IMPLANT
SUTURE FIBERWR #2 38 T-5 BLUE (SUTURE) ×2 IMPLANT
TOWEL OR 17X26 10 PK STRL BLUE (TOWEL DISPOSABLE) ×2 IMPLANT
TOWER SMARTMIX MINI (MISCELLANEOUS) IMPLANT

## 2021-11-03 NOTE — Transfer of Care (Signed)
Immediate Anesthesia Transfer of Care Note  Patient: ALAJA GOLDINGER  Procedure(s) Performed: REVERSE SHOULDER ARTHROPLASTY (Right: Shoulder)  Patient Location: PACU  Anesthesia Type:General  Level of Consciousness: awake, alert  and oriented  Airway & Oxygen Therapy: Patient Spontanous Breathing and Patient connected to face mask oxygen  Post-op Assessment: Report given to RN, Post -op Vital signs reviewed and stable and Patient moving all extremities X 4  Post vital signs: Reviewed and stable  Last Vitals:  Vitals Value Taken Time  BP 131/62 11/03/21 0918  Temp    Pulse 69 11/03/21 0919  Resp 22 11/03/21 0919  SpO2 100 % 11/03/21 0919  Vitals shown include unvalidated device data.  Last Pain:  Vitals:   11/03/21 0605  TempSrc:   PainSc: 0-No pain         Complications: No notable events documented.

## 2021-11-03 NOTE — Op Note (Signed)
[name], [name] MEDICAL RECORD NO: [id number] ACCOUNT NO: [id number] DATE OF BIRTH: December 29, 1950 FACILITY: [name] PHYSICIAN: Natalie Heater. [name], MD  Operative Report   DATE OF PROCEDURE: 11/03/2021  PREOPERATIVE DIAGNOSIS:  Right shoulder end-stage arthritis.  POSTOPERATIVE DIAGNOSIS:  Right shoulder end-stage arthritis.  PROCEDURE PERFORMED:  Right shoulder reverse total shoulder replacement with subscapularis repair.  ATTENDING SURGEON:  Natalie Heater. [name], MD  ASSISTANT:  Natalie [name], [place], who was scrubbed during the entire procedure and necessary for satisfactory completion of surgery.  ESTIMATED BLOOD LOSS:  200 mL.  FLUID REPLACEMENT:  1500 mL crystalloid.  COUNTS:  Instrument counts were correct.  COMPLICATIONS:  No complications.  ANTIBIOTICS:  Perioperative antibiotics were given.  INDICATIONS:  The patient is a 71 year old female with worsening right shoulder pain and dysfunction secondary to end-stage arthritis bone on bone.  The patient has poor function in addition to severe pain.  Given the patient's progression of symptoms  despite conservative treatment, we recommended surgical management with reverse shoulder replacement to provide the most reliable pain relief and functional recovery.  Informed consent obtained.  DESCRIPTION OF PROCEDURE:  After an adequate level of anesthesia was achieved, the patient was positioned in modified beach chair position.  Right shoulder was correctly identified and sterilely prepped and draped in the usual manner.  Timeout was called  verifying correct patient, correct site.  We entered the patient's shoulder using standard anterior deltopectoral incision starting at the coracoid process extending down to the anterior humerus.  Dissection down through subcutaneous tissues.  Bovie  electrocautery was used to control hemostasis.  Cephalic vein was identified and taken laterally with the deltoid.  Pectoralis was taken  medially.  Conjoined tendon was identified and retracted medially.  We tenodesed the biceps in situ with 0 Vicryl  figure-of-eight suture.  We then released the subscapularis off the lesser tuberosity and tagged for repair at the end with #2 FiberWire suture in a modified Mason-Allen suture technique.  We did release the inferior capsule progressively externally  rotating.  We released the biceps tendon and the anterior rotator cuff including supraspinatus and part of infraspinatus extending the shoulder.  We delivered the humeral head out of the wound.  We entered the proximal humerus with a 6 mm reamer. We  reamed up to a size 10.  We then placed our size 10 mm T-handle guide and resected the head at 20 degrees of retroversion with the oscillating saw. We removed excess osteophytes with a rongeur.  We then subluxed the humerus posteriorly gaining good  exposure of the glenoid face.  We removed the biceps stump.  We removed the labrum and the capsule, protecting the axillary nerve.  We then found our center point low on the glenoid and drilled out with a guide pin.  We then reamed for the metaglene  baseplate.  We did our peripheral hand reaming, drilled out our central peg hole and then impacted the metaglene baseplate into position.  We had good bony support.  We placed a 42 screw inferiorly, a 30 screw at the base of the coracoid, and a 18  posteriorly.  The superior and inferior screw were both locked.  The 18 nonlocked screw posteriorly was seated well.  We had good baseplate support and good screw security.  We irrigated thoroughly. I then placed a 38+3 standard glenosphere onto the  baseplate and secured that. I did a finger sweep around the glenoid and made sure that there was no soft  tissue caught up in the bearing.  Next, we went to the humerus and finished our humeral preparation reaming for the 1 right metaphysis.  We then  impacted the trial 10 stem 1 right metaphysis set on the 0 setting and  placed into 20 degrees of retroversion.  Once we had that done, we removed the trial components, irrigated thoroughly, drilled 1.6 mm holes in the lesser tuberosity, and placed #2  FiberWire suture for repair of the subscap.  Once we had the sutures placed, we irrigated again and then we used available bone graft from the humeral head and used impaction grafting technique with a press-fit HA coated 10 stem 1 right metaphysis set on  the 0 setting and impacted in 20 degrees of retroversion.  Once we had the stem in place and the stem was secure, we selected the real 38+3 poly and impacted down on the trial around the humeral tray and then we reduced the shoulder.  We had a nice  little pop as it reduced, appropriate tension, good stability.  We irrigated thoroughly and then repaired the subscapularis anatomically back to bone.  This did not restrict the range of motion.  At this point, we irrigated again and repaired  deltopectoral interval with 0 Vicryl suture in figure-of-eight followed by 2-0 Vicryl for subcutaneous closure and 4-0 Monocryl for skin.  Steri-Strips applied followed by sterile dressing.  The patient tolerated the procedure well.   [facility] D: 11/03/2021 9:30:11 am T: 11/03/2021 11:12:00 am  JOB: [id number]/ [id number]

## 2021-11-03 NOTE — Discharge Instructions (Signed)
Ice to the shoulder constantly.  Keep the incision covered and clean and dry for one week, then ok to get it wet in the shower. ° °Do exercise as instructed several times per day. ° °DO NOT reach behind your back or push up out of a chair with the operative arm. ° °Use a sling while you are up and around for comfort, may remove while seated.  Keep pillow propped behind the operative elbow. ° °Follow up with Dr Marnette Perkins in two weeks in the office, call [phone] for appt °

## 2021-11-03 NOTE — Interval H&P Note (Signed)
History and Physical Interval Note:  11/03/2021 7:05 AM  Natalie [name]  has presented today for surgery, with the diagnosis of right shoulder osteoarthritis end stage.  The various methods of treatment have been discussed with the patient and family. After consideration of risks, benefits and other options for treatment, the patient has consented to  Procedure(s) with comments: REVERSE SHOULDER ARTHROPLASTY (Right) - with ISB as a surgical intervention.  The patient's history has been reviewed, patient examined, no change in status, stable for surgery.  I have reviewed the patient's chart and labs.  Questions were answered to the patient's satisfaction.     [name]

## 2021-11-03 NOTE — Plan of Care (Signed)
°  Problem: Education: Goal: Knowledge of the prescribed therapeutic regimen will improve Outcome: Progressing   Problem: Activity: Goal: Ability to tolerate increased activity will improve Outcome: Progressing   Problem: Pain Management: Goal: Pain level will decrease with appropriate interventions Outcome: Progressing   

## 2021-11-03 NOTE — Anesthesia Procedure Notes (Signed)
Anesthesia Regional Block: Interscalene brachial plexus block   Pre-Anesthetic Checklist: , timeout performed,  Correct Patient, Correct Site, Correct Laterality,  Correct Procedure, Correct Position, site marked,  Risks and benefits discussed,  Surgical consent,  Pre-op evaluation,  At surgeon's request and post-op pain management  Laterality: Upper and Right  Prep: Maximum Sterile Barrier Precautions used, chloraprep       Needles:  Injection technique: Single-shot  Needle Type: Echogenic Needle     Needle Length: 5cm  Needle Gauge: 21     Additional Needles:   Procedures:,,,, ultrasound used (permanent image in chart),,    Narrative:  Start time: 11/03/2021 7:04 AM End time: 11/03/2021 7:09 AM Injection made incrementally with aspirations every 5 mL.  Performed by: Personally  Anesthesiologist: Barnet Glasgow, MD  Additional Notes: Block assessed prior to procedure. Patient tolerated procedure well.

## 2021-11-03 NOTE — Progress Notes (Signed)
°  Transition of Care [facility]) Screening Note   Patient Details  Name: MCKAYLIN BASTIEN Date of Birth: 05-09-1951   Transition of Care [facility]) CM/SW Contact:    Lennart Pall, LCSW Phone Number: 11/03/2021, 11:45 AM    Transition of Care Department [facility]) has reviewed patient and no TOC needs have been identified at this time. We will continue to monitor patient advancement through interdisciplinary progression rounds. If new patient transition needs arise, please place a TOC consult.

## 2021-11-03 NOTE — Anesthesia Procedure Notes (Signed)
Procedure Name: Intubation Date/Time: 11/03/2021 7:40 AM Performed by: Gean Maidens, CRNA Pre-anesthesia Checklist: Patient identified, Emergency Drugs available, Suction available, Patient being monitored and Timeout performed Patient Re-evaluated:Patient Re-evaluated prior to induction Oxygen Delivery Method: Circle system utilized Preoxygenation: Pre-oxygenation with 100% oxygen Induction Type: IV induction Ventilation: Mask ventilation without difficulty Laryngoscope Size: Mac and 3 Grade View: Grade I Tube type: Oral Tube size: 7.0 mm Number of attempts: 1 Airway Equipment and Method: Stylet Placement Confirmation: ETT inserted through vocal cords under direct vision, positive ETCO2 and breath sounds checked- equal and bilateral Secured at: 21 cm Tube secured with: Tape Dental Injury: Teeth and Oropharynx as per pre-operative assessment

## 2021-11-03 NOTE — Brief Op Note (Signed)
11/03/2021  9:23 AM  PATIENT:  Natalie [name]  71 y.o. female  PRE-OPERATIVE DIAGNOSIS:  right shoulder osteoarthritis end stage  POST-OPERATIVE DIAGNOSIS:  right shoulder osteoarthritis end stage  PROCEDURE:  Procedure(s) with comments: REVERSE SHOULDER ARTHROPLASTY (Right) - with ISB DePuy Delta Xtend with subscap repair  SURGEON:  Surgeon(s) and Role:    Netta Cedars, MD - Primary  PHYSICIAN ASSISTANT:   ASSISTANTS: [place] Bruns, PA-C   ANESTHESIA:   regional and general  EBL:  200 mL   BLOOD ADMINISTERED:none  DRAINS: none   LOCAL MEDICATIONS USED:  MARCAINE     SPECIMEN:  No Specimen  DISPOSITION OF SPECIMEN:  N/A  COUNTS:  YES  TOURNIQUET:  * No tourniquets in log *  DICTATION: .Other Dictation: Dictation Number [id number]  PLAN OF CARE: Admit to inpatient   PATIENT DISPOSITION:  PACU - hemodynamically stable.   Delay start of Pharmacological VTE agent (>24hrs) due to surgical blood loss or risk of bleeding: yes

## 2021-11-03 NOTE — Anesthesia Postprocedure Evaluation (Signed)
Anesthesia Post Note  Patient: Natalie [name]  Procedure(s) Performed: REVERSE SHOULDER ARTHROPLASTY (Right: Shoulder)     Patient location during evaluation: PACU Anesthesia Type: Regional and General Level of consciousness: awake and alert Pain management: pain level controlled Vital Signs Assessment: post-procedure vital signs reviewed and stable Respiratory status: spontaneous breathing, nonlabored ventilation, respiratory function stable and patient connected to nasal cannula oxygen Cardiovascular status: blood pressure returned to baseline and stable Postop Assessment: no apparent nausea or vomiting Anesthetic complications: no   No notable events documented.  Last Vitals:  Vitals:   11/03/21 1057 11/03/21 1224  BP: 123/60 124/60  Pulse: 65 71  Resp: 16 20  Temp:  36.6 C  SpO2: 98% 100%    Last Pain:  Vitals:   11/03/21 1224  TempSrc: Oral  PainSc:                  Barnet Glasgow

## 2021-11-04 DIAGNOSIS — M19011 Primary osteoarthritis, right shoulder: Secondary | ICD-10-CM | POA: Diagnosis not present

## 2021-11-04 LAB — BASIC METABOLIC PANEL
Anion gap: 7 (ref 5–15)
BUN: 16 mg/dL (ref 8–23)
CO2: 23 mmol/L (ref 22–32)
Calcium: 8.1 mg/dL — ABNORMAL LOW (ref 8.9–10.3)
Chloride: 101 mmol/L (ref 98–111)
Creatinine, Ser: 0.53 mg/dL (ref 0.44–1.00)
GFR, Estimated: >60 mL/min (ref >60)
Glucose, Bld: 151 mg/dL — ABNORMAL HIGH (ref 70–99)
Potassium: 3.7 mmol/L (ref 3.5–5.1)
Sodium: 131 mmol/L — ABNORMAL LOW (ref 135–145)

## 2021-11-04 LAB — HEMOGLOBIN AND HEMATOCRIT, BLOOD
HCT: 29.1 % — ABNORMAL LOW (ref 36.0–46.0)
Hemoglobin: 9.5 g/dL — ABNORMAL LOW (ref 12.0–15.0)

## 2021-11-04 NOTE — Progress Notes (Signed)
° °  Subjective: 1 Day Post-Op Procedure(s) (LRB): REVERSE SHOULDER ARTHROPLASTY (Right) Patient reports pain as mild.   Patient seen in rounds for Dr. [name] Patient is well, and has had no acute complaints or problems. No acute events overnight. She is resting in bed about to eat breakfast this morning. Voiding without difficulty. Positive flatus.  We will start therapy today.   Objective: Vital signs in last 24 hours: Temp:  [97.6 F (36.4 C)-98.6 F (37 C)] 98.3 F (36.8 C) (01/14 0851) Pulse Rate:  [64-98] 87 (01/14 0851) Resp:  [12-22] 16 (01/14 0851) BP: (105-140)/(48-92) 120/57 (01/14 0851) SpO2:  [97 %-100 %] 99 % (01/14 0851)  Intake/Output from previous day:  Intake/Output Summary (Last 24 hours) at 11/04/2021 0916 Last data filed at 11/04/2021 0903 Gross per 24 hour  Intake 3051.73 ml  Output 800 ml  Net 2251.73 ml     Intake/Output this shift: Total I/O In: 379.5 [P.O.:240; I.V.:139.5] Out: -   Labs: Recent Labs    11/04/21 0320  HGB 9.5*   Recent Labs    11/04/21 0320  HCT 29.1*   Recent Labs    11/04/21 0320  NA 131*  K 3.7  CL 101  CO2 23  BUN 16  CREATININE 0.53  GLUCOSE 151*  CALCIUM 8.1*   No results for input(s): LABPT, INR in the last 72 hours.  Exam: General - Patient is Alert and Oriented Extremity - Neurologically intact Sensation intact distally Intact pulses distally  Dressing - dressing changed to Aquacel Motor Function - intact, moving fingers and hand. Mild sensation difference between right and left hands related to block.  Past Medical History:  Diagnosis Date   Asthma    infrequent problem - no treatment since 2006   Back pain    Choroidal nevus of left eye    sees Dr. Gerarda Fraction at [facility].    Constipation    Coronary artery disease    Dysrhythmia    SVT - ablation by Dr. Curt Bears on 05/21/2016 and episodes since   Fatty liver    GERD (gastroesophageal reflux disease)    History of hiatal hernia     "very small"   Hx of colonic polyp    Hyperlipidemia    Hypertension    Joint pain    Melanoma of eye, left ([place])    sees Dr. Cordelia Pen   Myocardial infarction [facility])    Osteoarthritis    Osteoporosis    osteopenia   Shortness of breath    Supraventricular tachycardia ([facility])     Assessment/Plan: 1 Day Post-Op Procedure(s) (LRB): REVERSE SHOULDER ARTHROPLASTY (Right) Principal Problem:   S/P shoulder replacement, right  Estimated body mass index is 31.41 kg/m as calculated from the following:   Height as of this encounter: 5\' 4"  (1.626 m).   Weight as of this encounter: 83 kg. Advance diet Up with therapy    DVT Prophylaxis - Aspirin Weight bearing as tolerated.  ABLA: Hgb stable at 9.5 this AM.   Plan is to go Home after hospital stay. Plan for discharge today vs tomorrow once meeting goals with PT and having adequate pain control. Follow up in the office in 2 weeks.   Griffith Citron, PA-C Orthopedic Surgery [phone] 11/04/2021, 9:16 AM

## 2021-11-04 NOTE — Evaluation (Signed)
Occupational Therapy Evaluation Patient Details Name: Natalie Hall MRN: [id number] DOB: 06-12-1951 Today's Date: 11/04/2021   History of Present Illness 71 yo F s/p R TSA Reverse Protocol.  PMH includes: DM, CAD, CVA, Liver disease, COPD.   Clinical Impression   Patient admitted for the above procedure.  PTA she lives with her spouse, who is able to assist as needed.  At home she is independent with all ADL/IADL and mobility.  Post op discomfort and ROM restrictions are the deficits.  Currently she is needing up to Mod A for ADL completion at sit/stand level, and generalized supervision for in room mobility.  All precautions reviewed and questions answered.  No further OT needs in the acute setting.         Recommendations for follow up therapy are one component of a multi-disciplinary discharge planning process, led by the attending physician.  Recommendations may be updated based on patient status, additional functional criteria and insurance authorization.   Follow Up Recommendations  Follow physician's recommendations for discharge plan and follow up therapies    Assistance Recommended at Discharge Intermittent Supervision/Assistance  Patient can return home with the following A little help with bathing/dressing/bathroom;Assistance with cooking/housework    Functional Status Assessment  Patient has had a recent decline in their functional status and demonstrates the ability to make significant improvements in function in a reasonable and predictable amount of time.  Equipment Recommendations  None recommended by OT    Recommendations for Other Services       Precautions / Restrictions Precautions Precautions: Shoulder Type of Shoulder Precautions: AROM elbow distal.  No shoulder ROM until cleared. Shoulder Interventions: Shoulder sling/immobilizer Precaution Booklet Issued: Yes (comment) Precaution Comments: Reviewed with teach back Restrictions Weight Bearing Restrictions:  Yes RUE Weight Bearing: Non weight bearing      Mobility Bed Mobility Overal bed mobility: Needs Assistance             General bed mobility comments: up in recliner    Transfers Overall transfer level: Needs assistance   Transfers: Sit to/from Stand Sit to Stand: Supervision                  Balance Overall balance assessment: Mild deficits observed, not formally tested                                         ADL either performed or assessed with clinical judgement   ADL       Grooming: Oral care;Wash/dry face;Set up;Standing           Upper Body Dressing : Moderate assistance;Standing   Lower Body Dressing: Moderate assistance;Sit to/from stand   Toilet Transfer: Supervision/safety;Ambulation;Regular Toilet                   Vision Baseline Vision/History: 1 Wears glasses Patient Visual Report: No change from baseline       Perception Perception Perception: Not tested   Praxis Praxis Praxis: Not tested    Pertinent Vitals/Pain Pain Assessment: Faces Faces Pain Scale: Hurts a little bit Pain Location: R shoulder Pain Descriptors / Indicators: Aching Pain Intervention(s): Monitored during session;Premedicated before session     Hand Dominance Right   Extremity/Trunk Assessment Upper Extremity Assessment Upper Extremity Assessment: RUE deficits/detail RUE Deficits / Details: TSA with ROM restrictions.  Block remains in place. RUE Sensation: decreased light touch RUE Coordination: decreased fine  motor   Lower Extremity Assessment Lower Extremity Assessment: Overall WFL for tasks assessed   Cervical / Trunk Assessment Cervical / Trunk Assessment: Normal   Communication Communication Communication: No difficulties   Cognition Arousal/Alertness: Awake/alert Behavior During Therapy: WFL for tasks assessed/performed Overall Cognitive Status: Within Functional Limits for tasks assessed                                        General Comments   VSS on RA    Exercises  Elbow distal per HEP sheet performed with AA due to block.     Shoulder Instructions      Home Living Family/patient expects to be discharged to:: Private residence Living Arrangements: Spouse/significant other;Children Available Help at Discharge: Family;Available 24 hours/day Type of Home: House Home Access: Stairs to enter [employer] of Steps: 4 Entrance Stairs-Rails: Left Home Layout: One level     Bathroom Shower/Tub: Occupational psychologist: Handicapped height Bathroom Accessibility: Yes How Accessible: Accessible via walker Home Equipment: Tub bench;Grab bars - tub/shower          Prior Functioning/Environment Prior Level of Function : Independent/Modified Independent                        OT Problem List: Decreased strength;Decreased range of motion;Decreased activity tolerance;Pain;Impaired sensation      OT Treatment/Interventions:      OT Goals(Current goals can be found in the care plan section) Acute Rehab OT Goals Patient Stated Goal: Return home OT Goal Formulation: With patient Time For Goal Achievement: 11/06/21 Potential to Achieve Goals: Good  OT Frequency:      Co-evaluation              AM-PAC OT "6 Clicks" Daily Activity     Outcome Measure Help from another person eating meals?: None Help from another person taking care of personal grooming?: A Little Help from another person toileting, which includes using toliet, bedpan, or urinal?: A Little Help from another person bathing (including washing, rinsing, drying)?: A Lot Help from another person to put on and taking off regular upper body clothing?: A Lot Help from another person to put on and taking off regular lower body clothing?: A Lot 6 Click Score: 16   End of Session    Activity Tolerance: Patient tolerated treatment well Patient left: in chair;with call bell/phone  within reach  OT Visit Diagnosis: Unsteadiness on feet (R26.81);Pain Pain - Right/Left: Right Pain - part of body: Shoulder                Time: 6269-4854 OT Time Calculation (min): 33 min Charges:  OT General Charges $OT Visit: 1 Visit OT Evaluation $OT Eval Moderate Complexity: 1 Mod OT Treatments $Self Care/Home Management : 8-22 mins  11/04/2021  RP, OTR/L  Acute Rehabilitation Services  Office:  [phone]   Metta Clines 11/04/2021, 10:04 AM

## 2021-11-04 NOTE — Progress Notes (Signed)
Provided discharge education/instructions, all questions and concerns addressed, Pt not in acute distress, no SOB or chest pain, denies lightheadedness, discharged home with belongings accompanied by family.

## 2021-11-04 NOTE — Plan of Care (Signed)
°  Problem: Education: Goal: Knowledge of the prescribed therapeutic regimen will improve Outcome: Progressing   Problem: Activity: Goal: Ability to tolerate increased activity will improve Outcome: Progressing   Problem: Pain Management: Goal: Pain level will decrease with appropriate interventions Outcome: Progressing   

## 2021-11-04 NOTE — Plan of Care (Signed)
  Problem: Pain Management: Goal: Pain level will decrease with appropriate interventions Outcome: Progressing   

## 2021-11-07 NOTE — Discharge Summary (Signed)
In most cases prophylactic antibiotics for Dental procdeures after total joint surgery are not necessary.  Exceptions are as follows:  1. History of prior total joint infection  2. Severely immunocompromised (Organ Transplant, cancer chemotherapy, Rheumatoid biologic meds such as [place])  3. Poorly controlled diabetes (A1C &gt; 8.0, blood glucose over 200)  If you have one of these conditions, contact your surgeon for an antibiotic prescription, prior to your dental procedure. Orthopedic Discharge Summary        Physician Discharge Summary  Patient ID: Natalie Hall MRN: [id number] DOB/AGE: [birth year] 71 y.o.  Admit date: 11/03/2021 Discharge date: 11/07/2021   Procedures:  Procedure(s) (LRB): REVERSE SHOULDER ARTHROPLASTY (Right)  Attending Physician:  Dr. Esmond Plants  Admission Diagnoses:   right shoulder cuff arthropathy  Discharge Diagnoses:  right shoulder cuff arthropathy   Past Medical History:  Diagnosis Date   Asthma    infrequent problem - no treatment since 2006   Back pain    Choroidal nevus of left eye    sees Dr. Gerarda Fraction at [place].    Constipation    Coronary artery disease    Dysrhythmia    SVT - ablation by Dr. Curt Bears on 05/21/2016 and episodes since   Fatty liver    GERD (gastroesophageal reflux disease)    History of hiatal hernia    "very small"   Hx of colonic polyp    Hyperlipidemia    Hypertension    Joint pain    Melanoma of eye, left ([place])    sees Dr. Cordelia Pen   Myocardial infarction [facility])    Osteoarthritis    Osteoporosis    osteopenia   Shortness of breath    Supraventricular tachycardia ([place])     PCP: Laurey Morale, MD   Discharged Condition: good  Hospital Course:  Patient underwent the above stated procedure on 11/03/2021. Patient tolerated the procedure well and brought to the recovery room in good condition and subsequently to the floor. Patient had an uncomplicated hospital course and was stable  for discharge.   Disposition: Discharge disposition: 01-Home or Self Care      with follow up in 2 weeks    Follow-up Information     Netta Cedars, MD. Call in 2 week(s).   Specialty: Orthopedic Surgery Why: please call [phone] for appt Contact information: [address] [place] [address] [phone]                 Dental Antibiotics:  In most cases prophylactic antibiotics for Dental procdeures after total joint surgery are not necessary.  Exceptions are as follows:  1. History of prior total joint infection  2. Severely immunocompromised (Organ Transplant, cancer chemotherapy, Rheumatoid biologic meds such as [place])  3. Poorly controlled diabetes (A1C &gt; 8.0, blood glucose over 200)  If you have one of these conditions, contact your surgeon for an antibiotic prescription, prior to your dental procedure.    Allergies as of 11/04/2021       Reactions   Codeine Shortness Of Breath, Rash, Other (See Comments)   Flushing, tolerates hydrocodone   Levofloxacin In D5w Other (See Comments)   Leg pain    Amoxicillin-pot Clavulanate Rash   Has patient had a PCN reaction causing immediate rash, facial/tongue/throat swelling, SOB or lightheadedness with hypotension: No Has patient had a PCN reaction causing severe rash involving mucus membranes or skin necrosis: No Has patient had a PCN reaction that required hospitalization: No Has patient  had a PCN reaction occurring within the last 10 years: Yes If all of the above answers are "NO", then may proceed with Cephalosporin use.   Escitalopram Oxalate Rash        Medication List     TAKE these medications    acetaminophen 500 MG tablet Commonly known as: TYLENOL Take 1,000 mg by mouth every 6 (six) hours as needed for moderate pain. Notes to patient: Last dose given 01/14 08:59am   albuterol 108 (90 Base) MCG/ACT inhaler Commonly known as: VENTOLIN HFA Inhale 2 puffs into  the lungs every 4 (four) hours as needed for wheezing or shortness of breath. Notes to patient: Resume home regimen   aspirin EC 81 MG tablet Take 81 mg by mouth daily.   Calcium Carb-Cholecalciferol 600-800 MG-UNIT Tabs Take 2 tablets by mouth daily.   cetirizine 10 MG tablet Commonly known as: ZYRTEC Take 10 mg by mouth daily as needed for allergies. Notes to patient: Resume home regimen   cyclobenzaprine 10 MG tablet Commonly known as: FLEXERIL Take 1 tablet (10 mg total) by mouth 3 (three) times daily as needed for muscle spasms. Notes to patient: Resume home regimen   ezetimibe 10 MG tablet Commonly known as: ZETIA TAKE 1 TABLET BY MOUTH  DAILY   metFORMIN 500 MG tablet Commonly known as: GLUCOPHAGE Take 1 tablet (500 mg total) by mouth daily with breakfast.   metoprolol succinate 25 MG 24 hr tablet Commonly known as: TOPROL-XL TAKE 1 TABLET BY MOUTH  TWICE DAILY   MULTIVITAL PO Take 1 tablet by mouth daily. Notes to patient: Resume home regimen   omeprazole 40 MG capsule Commonly known as: PRILOSEC TAKE 1 CAPSULE BY MOUTH  DAILY   ramipril 10 MG capsule Commonly known as: ALTACE TAKE 1 CAPSULE BY MOUTH  DAILY   rosuvastatin 40 MG tablet Commonly known as: CRESTOR TAKE 1 TABLET BY MOUTH  DAILY   sodium chloride 0.65 % Soln nasal spray Commonly known as: OCEAN Place 1 spray into both nostrils as needed for congestion. Notes to patient: Last dose given 01/13 04:26pm   temazepam 15 MG capsule Commonly known as: RESTORIL Take 1 capsule (15 mg total) by mouth at bedtime. TAKE 1 CAPSULE BY MOUTH  EVERY NIGHT AT BEDTIME AS  NEEDED FOR SLEEP What changed:  when to take this reasons to take this additional instructions   traMADol 50 MG tablet Commonly known as: ULTRAM Take 1 tablet (50 mg total) by mouth every 6 (six) hours as needed for moderate pain or severe pain. What changed: reasons to take this Notes to patient: Last dose given 01/14 12:19am    Vitamin D 50 MCG (2000 UT) Caps Take 1 capsule (2,000 Units total) by mouth daily.          Signed: Ventura Bruns 11/07/2021, 8:40 AM  [facility] is now [facility] [employer] [address] [place], [place], [place] [address] Phone: [place]

## 2021-11-08 ENCOUNTER — Encounter (HOSPITAL_COMMUNITY): Payer: Self-pay | Admitting: Orthopedic Surgery

## 2021-11-28 ENCOUNTER — Telehealth: Payer: Self-pay | Admitting: Family Medicine

## 2021-11-28 NOTE — Telephone Encounter (Signed)
Left message for patient to call back and schedule Medicare Annual Wellness Visit (AWV) either virtually or in office. Left  my Natalie Hall number [phone]   Last AWV 10/03/20  ; please schedule at anytime with [facility] Nurse Health Advisor 1 or 2   This should be a 45 minute visit.

## 2021-12-18 LAB — HEPATIC FUNCTION PANEL
ALT: 17 IU/L (ref 0–32)
AST: 17 IU/L (ref 0–40)
Albumin: 4.1 g/dL (ref 3.7–4.7)
Alkaline Phosphatase: 57 IU/L (ref 44–121)
Bilirubin Total: 0.3 mg/dL (ref 0.0–1.2)
Bilirubin, Direct: 0.1 mg/dL (ref 0.00–0.40)
Total Protein: 6.8 g/dL (ref 6.0–8.5)

## 2021-12-18 LAB — LIPID PANEL
Chol/HDL Ratio: 3.3 ratio (ref 0.0–4.4)
Cholesterol, Total: 104 mg/dL (ref 100–199)
HDL: 32 mg/dL — ABNORMAL LOW (ref >39)
LDL Chol Calc (NIH): 50 mg/dL (ref 0–99)
Triglycerides: 122 mg/dL (ref 0–149)
VLDL Cholesterol Cal: 22 mg/dL (ref 5–40)

## 2021-12-19 ENCOUNTER — Telehealth (HOSPITAL_BASED_OUTPATIENT_CLINIC_OR_DEPARTMENT_OTHER): Payer: Self-pay

## 2021-12-19 NOTE — Telephone Encounter (Addendum)
Results called to patient who verbalizes understanding!     ----- Message from Loel Dubonnet, NP sent at 12/19/2021  7:35 AM EST ----- Cholesterol numbers at goal. Normal liver enzymes. Continue current medications.

## 2021-12-21 ENCOUNTER — Ambulatory Visit (INDEPENDENT_AMBULATORY_CARE_PROVIDER_SITE_OTHER): Payer: Medicare Other

## 2021-12-21 VITALS — BP 128/62 | HR 74 | Temp 98.2°F | Ht 64.0 in | Wt 186.3 lb

## 2021-12-21 DIAGNOSIS — Z0000 Encounter for general adult medical examination without abnormal findings: Secondary | ICD-10-CM | POA: Diagnosis not present

## 2021-12-21 NOTE — Progress Notes (Addendum)
Subjective:   Natalie Hall is a 71 y.o. female who presents for Medicare Annual (Subsequent) preventive examination.  Review of Systems        Objective:    Today's Vitals   12/21/21 1042 12/21/21 1049  BP: 128/62   Pulse: 74   Temp: 98.2 F (36.8 C)   TempSrc: Oral   SpO2: 99%   Weight: 186 lb 4.8 oz (84.5 kg)   Height: 5\' 4"  (1.626 m)   PainSc:  3    Body mass index is 31.98 kg/m.  Advanced Directives 12/21/2021 11/03/2021 10/20/2021 10/03/2020 07/02/2019 06/25/2019 12/18/2018  Does Patient Have a Medical Advance Directive? Yes Yes Yes Yes Yes - Yes  Type of Advance Directive [place];Living will [place];Living will [place];Living will [place];Living will [place];Living will - [place]  Does patient want to make changes to medical advance directive? No - Patient declined No - Patient declined - No - Patient declined No - Patient declined - -  Copy of [place] in Chart? No - copy requested Yes - validated most recent copy scanned in chart (See row information) Yes - validated most recent copy scanned in chart (See row information) Yes - validated most recent copy scanned in chart (See row information) Yes - validated most recent copy scanned in chart (See row information) Yes - validated most recent copy scanned in chart (See row information) -  Would patient like information on creating a medical advance directive? - - - - - - -    Current Medications (verified) Outpatient Encounter Medications as of 12/21/2021  Medication Sig   acetaminophen (TYLENOL) 500 MG tablet Take 1,000 mg by mouth every 6 (six) hours as needed for moderate pain.   albuterol (VENTOLIN HFA) 108 (90 Base) MCG/ACT inhaler Inhale 2 puffs into the lungs every 4 (four) hours as needed for wheezing or shortness of breath.   aspirin EC 81 MG tablet Take 81 mg by mouth daily.    Calcium Carb-Cholecalciferol 600-800 MG-UNIT TABS Take 2 tablets by mouth daily.   cetirizine (ZYRTEC) 10 MG tablet Take 10 mg by mouth daily as needed for allergies.   Cholecalciferol (VITAMIN D) 50 MCG (2000 UT) CAPS Take 1 capsule (2,000 Units total) by mouth daily.   cyclobenzaprine (FLEXERIL) 10 MG tablet Take 1 tablet (10 mg total) by mouth 3 (three) times daily as needed for muscle spasms.   ezetimibe (ZETIA) 10 MG tablet TAKE 1 TABLET BY MOUTH  DAILY   metoprolol succinate (TOPROL-XL) 25 MG 24 hr tablet TAKE 1 TABLET BY MOUTH  TWICE DAILY   Multiple Vitamins-Minerals (MULTIVITAL PO) Take 1 tablet by mouth daily.   omeprazole (PRILOSEC) 40 MG capsule TAKE 1 CAPSULE BY MOUTH  DAILY   ramipril (ALTACE) 10 MG capsule TAKE 1 CAPSULE BY MOUTH  DAILY   rosuvastatin (CRESTOR) 40 MG tablet TAKE 1 TABLET BY MOUTH  DAILY   sodium chloride (OCEAN) 0.65 % SOLN nasal spray Place 1 spray into both nostrils as needed for congestion.   temazepam (RESTORIL) 15 MG capsule Take 1 capsule (15 mg total) by mouth at bedtime. TAKE 1 CAPSULE BY MOUTH  EVERY NIGHT AT BEDTIME AS  NEEDED FOR SLEEP (Patient taking differently: Take 15 mg by mouth at bedtime as needed for sleep.)   traMADol (ULTRAM) 50 MG tablet Take 1 tablet (50 mg total) by mouth every 6 (six) hours as  needed for moderate pain or severe pain.   [DISCONTINUED] metFORMIN (GLUCOPHAGE) 500 MG tablet Take 1 tablet (500 mg total) by mouth daily with breakfast.   No facility-administered encounter medications on file as of 12/21/2021.    Allergies (verified) Codeine, Levofloxacin in d5w, Amoxicillin-pot clavulanate, and Escitalopram oxalate   History: Past Medical History:  Diagnosis Date   Asthma    infrequent problem - no treatment since 2006   Back pain    Choroidal nevus of left eye    sees Dr. Gerarda Fraction at [place].    Constipation    Coronary artery disease    Dysrhythmia    SVT - ablation by Dr. Curt Bears on 05/21/2016 and  episodes since   Fatty liver    GERD (gastroesophageal reflux disease)    History of hiatal hernia    "very small"   Hx of colonic polyp    Hyperlipidemia    Hypertension    Joint pain    Melanoma of eye, left ([place])    sees Dr. Cordelia Pen   Myocardial infarction [facility])    Osteoarthritis    Osteoporosis    osteopenia   Shortness of breath    Supraventricular tachycardia ([place])    Past Surgical History:  Procedure Laterality Date   ABDOMINAL HYSTERECTOMY     ACHILLES TENDON REPAIR  10-07-11   torn on right, per Dr. Marge Duncans SURGERY     torn tendon left ankle   BREAST BIOPSY     CARDIAC CATHETERIZATION N/A 03/14/2016   Procedure: Left Heart Cath and Coronary Angiography;  Surgeon: Peter M Martinique, MD;  Location: [place] [facility];  Service: Cardiovascular;  Laterality: N/A;   CARDIAC CATHETERIZATION N/A 03/14/2016   Procedure: Coronary Stent Intervention;  Surgeon: Peter M Martinique, MD;  Location: [facility];  Service: Cardiovascular;  Laterality: N/A;   CARPAL TUNNEL RELEASE     CATARACT EXTRACTION W/ INTRAOCULAR LENS IMPLANT Bilateral    CHOLECYSTECTOMY     COLONOSCOPY  06/23/2014   per Dr. Ardis Hughs, adenomatous polyps, repeat in 7 yrs     ELECTROPHYSIOLOGIC STUDY N/A 05/21/2016   Procedure: SVT Ablation;  Surgeon: [name], MD;  Location: [facility];  Service: Cardiovascular;  Laterality: N/A;   EXCISION HAGLUND'S DEFORMITY WITH ACHILLES TENDON REPAIR Left 07/02/2019   Procedure: Left Achilles tendon debridement and reconstruction and excision of Haglund deformity;  Surgeon: Wylene Simmer, MD;  Location: [place];  Service: Orthopedics;  Laterality: Left;   EYE SURGERY Left 08/2016   GASTROCNEMIUS RECESSION Left 07/02/2019   Procedure: Gastroc recession;  Surgeon: Wylene Simmer, MD;  Location: [place];  Service: Orthopedics;  Laterality: Left;   IR KYPHO THORACIC WITH BONE BIOPSY  12/18/2018   IR RADIOLOGIST EVAL & MGMT   12/09/2018   IR RADIOLOGIST EVAL & MGMT  04/30/2019   KNEE CLOSED REDUCTION Left 02/03/2014   Procedure: CLOSED MANIPULATION LEFT KNEE;  Surgeon: Gearlean Alf, MD;  Location: [facility];  Service: Orthopedics;  Laterality: Left;   POLYPECTOMY     REVERSE SHOULDER ARTHROPLASTY     Left   REVERSE SHOULDER ARTHROPLASTY Left 06/28/2017   Procedure: LEFT SHOULDER REVERSE SHOULDER ARTHROPLASTY;  Surgeon: Netta Cedars, MD;  Location: [place];  Service: Orthopedics;  Laterality: Left;   REVERSE SHOULDER ARTHROPLASTY Right 11/03/2021   Procedure: REVERSE SHOULDER ARTHROPLASTY;  Surgeon: Netta Cedars, MD;  Location: [facility];  Service: Orthopedics;  Laterality: Right;  with ISB   TOTAL KNEE ARTHROPLASTY Left 12/21/2013   Procedure: LEFT TOTAL KNEE ARTHROPLASTY;  Surgeon: Gearlean Alf, MD;  Location: [facility];  Service: Orthopedics;  Laterality: Left;   VARICOSE VEIN SURGERY     Family History  Problem Relation Age of Onset   Colon polyps Mother    Diabetes Mother    High blood pressure Mother    High Cholesterol Mother    Thyroid disease Mother    Anxiety disorder Mother    Colon cancer Maternal Aunt    Liver cancer Maternal Aunt    Breast cancer Other    Coronary artery disease Other    Colon cancer Other    Diabetes Other    Hypertension Other    Kidney disease Other    Lung cancer Other    Lung cancer Brother        spleen, bone   Diabetes Father    High blood pressure Father    High Cholesterol Father    Heart disease Father    Depression Father    Obesity Father    Esophageal cancer Neg Hx    Stomach cancer Neg Hx    Rectal cancer Neg Hx    Social History   Socioeconomic History   Marital status: Married    Spouse name: Runner, broadcasting/film/video   Number of children: Not on file   Years of education: Not on file   Highest education level: Associate degree: occupational, Hotel manager, or vocational program  Occupational History   Occupation: retired  Tobacco Use   Smoking status: Never   Smokeless  tobacco: Never  Vaping Use   Vaping Use: Never used  Substance and Sexual Activity   Alcohol use: No    Alcohol/week: 0.0 standard drinks   Drug use: No   Sexual activity: Not on file  Other Topics Concern   Not on file  Social History Narrative   Not on file   Social Determinants of Health   Financial Resource Strain: Low Risk    Difficulty of Paying Living Expenses: Not hard at all  Food Insecurity: No Food Insecurity   Worried About Charity fundraiser in the Last Year: Never true   Ran Out of Food in the Last Year: Never true  Transportation Needs: No Transportation Needs   Lack of Transportation (Medical): No   Lack of Transportation (Non-Medical): No  Physical Activity: Inactive   Days of Exercise per Week: 0 days   Minutes of Exercise per Session: 0 min  Stress: No Stress Concern Present   Feeling of Stress : Only a little  Social Connections: Engineer, building services of Communication with Friends and Family: More than three times a week   Frequency of Social Gatherings with Friends and Family: More than three times a week   Attends Religious Services: More than 4 times per year   Active Member of [employer] or Organizations: Yes   Attends Music therapist: More than 4 times per year   Marital Status: Married     Clinical Intake:  Pre-visit preparation completed: Yes  How often do you need to have someone help you when you read instructions, pamphlets, or other written materials from your doctor or pharmacy?: 1 - Never  Diabetic?  No  Interpreter Needed?: NoActivities of Daily Living In your present state of health, do you have any difficulty performing the following activities: 12/21/2021 12/18/2021  Hearing? N N  Vision? N N  Difficulty concentrating or  making decisions? N N  Walking or climbing stairs? N Y  Dressing or bathing? N N  Doing errands, shopping? N N  Preparing Food and eating ? N N  Using the Toilet? N N  In the past six  months, have you accidently leaked urine? N Y  Do you have problems with loss of bowel control? N N  Managing your Medications? N N  Managing your Finances? N N  Housekeeping or managing your Housekeeping? N N  Some recent data might be hidden    Patient Care Team: Laurey Morale, MD as PCP - General Hilty, Nadean Corwin, MD as PCP - Cardiology (Cardiology) Netta Cedars, MD as Consulting Physician (Orthopedic Surgery) Ortho, [place] (Specialist) Pixie Casino, MD as Consulting Physician (Cardiology) [place], Jennefer Bravo, MD as Referring Physician (Dermatology)  Indicate any recent Medical Services you may have received from other than [facility] providers in the past year (date may be approximate).     Assessment:   This is a routine wellness examination for Alletta.  Hearing/Vision screen Hearing Screening - Comments:: No difficulty hearing Vision Screening - Comments:: Wears glasses and has implants. Followed by Dr Valetta Close  Dietary issues and exercise activities discussed: Exercise limited by: orthopedic condition(s)   Goals Addressed               This Visit's Progress     patient (pt-stated)        Remain as healthy as possible.       Depression Screen PHQ 2/9 Scores 12/21/2021 10/12/2021 09/11/2021 04/13/2021 10/03/2020 03/31/2019 08/04/2018  PHQ - 2 Score 0 0 0 0 0 0 4  PHQ- 9 Score - 2 - 4 0 - 16  Exception Documentation - - - - - - Medical reason    Fall Risk Fall Risk  12/21/2021 12/18/2021 10/12/2021 09/15/2021 09/11/2021  Falls in the past year? 0 0 0 0 0  Number falls in past yr: 0 0 0 - -  Injury with Fall? 0 - 0 - -  Risk for fall due to : No Fall Risks - No Fall Risks - -  Follow up - - - - -    [place]:  Any stairs in or around the home? Yes  If so, are there any without handrails? No  Home free of loose throw rugs in walkways, pet beds, electrical cords, etc? Yes  Adequate lighting in your home to reduce risk of  falls? Yes   ASSISTIVE DEVICES UTILIZED TO PREVENT FALLS:  Life alert? No  Use of a cane, walker or w/c? No  Grab bars in the bathroom? Yes  Shower chair or bench in shower? Yes  Elevated toilet seat or a handicapped toilet? Yes   TIMED UP AND GO:  Was the test performed? Yes .  Length of time to ambulate 10 feet: 5 sec.   Gait steady and fast without use of assistive device  Cognitive Function: MMSE - Mini Mental State Exam 05/15/2018  Not completed: (No Data)     6CIT Screen 12/21/2021  What Year? 0 points  What month? 0 points  What time? 0 points  Count back from 20 0 points  Months in reverse 0 points  Repeat phrase 0 points  Total Score 0    Immunizations Immunization History  Administered Date(s) Administered   Fluad Quad(high Dose 65+) 07/24/2019   Influenza Split 08/06/2011, 08/18/2012   Influenza Whole 08/05/2009, 07/19/2010   Influenza, High Dose  Seasonal PF 08/15/2015, 08/01/2016, 07/23/2017, 08/19/2018   Influenza,inj,Quad PF,6+ Mos 07/24/2013   Influenza,inj,quad, With Preservative 08/05/2014, 07/22/2017, 08/10/2021   Influenza-Unspecified 08/30/2020   PFIZER Comirnaty(Gray Top)Covid-19 Tri-Sucrose Vaccine 05/24/2021   PFIZER(Purple Top)SARS-COV-2 Vaccination 11/17/2019, 12/08/2019, 08/30/2020   Pfizer Covid-19 Vaccine Bivalent Booster 18yrs & up 08/10/2021   Pneumococcal Conjugate-13 04/06/2015   Pneumococcal Polysaccharide-23 10/22/2004   Pneumococcal-Unspecified 10/23/2015   Tdap 04/06/2015   Zoster Recombinat (Shingrix) 09/21/2017, 09/24/2017, 12/25/2017   Zoster, Live 04/12/2010    TDAP status: Up to date  Flu Vaccine status: Up to date  Pneumococcal vaccine status: Due, Education has been provided regarding the importance of this vaccine. Advised may receive this vaccine at local pharmacy or Health Dept. Aware to provide a copy of the vaccination record if obtained from local pharmacy or Health Dept. Verbalized acceptance and  understanding.  Covid-19 vaccine status: Completed vaccines  Qualifies for Shingles Vaccine? Yes   Zostavax completed Yes   Shingrix Completed?: Yes  Screening Tests Health Maintenance  Topic Date Due   Pneumonia Vaccine 34+ Years old (39) 12/22/2022 (Originally 10/22/2016)   MAMMOGRAM  02/11/2023   TETANUS/TDAP  04/05/2025   COLONOSCOPY (Pts 45-31yrs Insurance coverage will need to be confirmed)  08/14/2028   INFLUENZA VACCINE  Completed   DEXA SCAN  Completed   COVID-19 Vaccine  Completed   Hepatitis C Screening  Completed   Zoster Vaccines- Shingrix  Completed   HPV VACCINES  Aged Out    Health Maintenance  There are no preventive care reminders to display for this patient.   Colorectal cancer screening: Type of screening: Colonoscopy. Completed 08/14/21. Repeat every 7 years  Mammogram status: Completed 02/10/21. Repeat every year  Bone Density status: Completed 02/10/21. Results reflect: Bone density results: OSTEOPENIA. Repeat every 5 years.  Lung Cancer Screening: (Low Dose CT Chest recommended if Age 41-80 years, 30 pack-year currently smoking OR have quit w/in 15years.) does not qualify.     Additional Screening:  Hepatitis C Screening: does qualify; Completed 02/13/16  Vision Screening: Recommended annual ophthalmology exams for early detection of glaucoma and other disorders of the eye. Is the patient up to date with their annual eye exam?  Yes  Who is the provider or what is the name of the office in which the patient attends annual eye exams? Dr Valetta Close If pt is not established with a provider, would they like to be referred to a provider to establish care? No .   Dental Screening: Recommended annual dental exams for proper oral hygiene  Community Resource Referral / Chronic Care Management:  CRR required this visit?  No   CCM required this visit?  No      Plan:     I have personally reviewed and noted the following in the patients chart:   Medical  and social history Use of alcohol, tobacco or illicit drugs  Current medications and supplements including opioid prescriptions.  Functional ability and status Nutritional status Physical activity Advanced directives List of other physicians Hospitalizations, surgeries, and ER visits in previous 12 months Vitals Screenings to include cognitive, depression, and falls Referrals and appointments  In addition, I have reviewed and discussed with patient certain preventive protocols, quality metrics, and best practice recommendations. A written personalized care plan for preventive services as well as general preventive health recommendations were provided to patient.     Criselda Peaches, LPN   03/29/1274   Nurse Notes: None

## 2021-12-21 NOTE — Patient Instructions (Addendum)
Natalie Hall , Thank you for taking time to come for your Medicare Wellness Visit. I appreciate your ongoing commitment to your health goals. Please review the following plan we discussed and let me know if I can assist you in the future.   These are the goals we discussed:  Goals       Exercise 150 min/wk Moderate Activity      Keep up exercise at the PT area      patient (pt-stated)      Remain as healthy as possible.      Patient Stated      I will continue to go to silver sne[employer] 3x per week for 1 hour      Weight (lb) < 200 lb (90.7 kg)      Cardiac rehab  Try the same things you did in while in rehab   Eat more vegetables Stay away from fried foods  Check out  online nutrition programs as GumSearch.nl and http://vang.com/; fit16me; Look for foods with "whole" wheat; bran; oatmeal etc Shot at the farmer's markets in season for fresher choices  Watch for "hydrogenated" on the label of oils which are trans-fats.  Watch for "high fructose corn syrup" in snacks, yogurt or ketchup  Meats have less marbling; bright colored fruits and vegetables;  Canned; dump out liquid and wash vegetables. Be mindful of what we are eating  Portion control is essential to a health weight! Sit down; take a break and enjoy your meal; take smaller bites; put the fork down between bites;  It takes 20 minutes to get full; so check in with your fullness cues and stop eating when you start to fill full               This is a list of the screening recommended for you and due dates:  Health Maintenance  Topic Date Due   Pneumonia Vaccine (3) 12/22/2022*   Mammogram  02/11/2023   Tetanus Vaccine  04/05/2025   Colon Cancer Screening  08/14/2028   Flu Shot  Completed   DEXA scan (bone density measurement)  Completed   COVID-19 Vaccine  Completed   Hepatitis C Screening: USPSTF Recommendation to screen - Ages 70-79 yo.  Completed   Zoster (Shingles) Vaccine  Completed   HPV Vaccine  Aged  Out  *Topic was postponed. The date shown is not the original due date.   Advanced directives: Yes Copy on file  Conditions/risks identified: None  Next appointment: Follow up in one year for your annual wellness visit    Preventive Care 65 Years and Older, Female Preventive care refers to lifestyle choices and visits with your health care provider that can promote health and wellness. What does preventive care include? A yearly physical exam. This is also called an annual well check. Dental exams once or twice a year. Routine eye exams. Ask your health care provider how often you should have your eyes checked. Personal lifestyle choices, including: Daily care of your teeth and gums. Regular physical activity. Eating a healthy diet. Avoiding tobacco and drug use. Limiting alcohol use. Practicing safe sex. Taking low-dose aspirin every day. Taking vitamin and mineral supplements as recommended by your health care provider. What happens during an annual well check? The services and screenings done by your health care provider during your annual well check will depend on your age, overall health, lifestyle risk factors, and family history of disease. Counseling  Your health care provider may ask you questions about  your: Alcohol use. Tobacco use. Drug use. Emotional well-being. Home and relationship well-being. Sexual activity. Eating habits. History of falls. Memory and ability to understand (cognition). Work and work Statistician. Reproductive health. Screening  You may have the following tests or measurements: Height, weight, and BMI. Blood pressure. Lipid and cholesterol levels. These may be checked every 5 years, or more frequently if you are over 97 years old. Skin check. Lung cancer screening. You may have this screening every year starting at age 65 if you have a 30-pack-year history of smoking and currently smoke or have quit within the past 15 years. Fecal occult  blood test (FOBT) of the stool. You may have this test every year starting at age 94. Flexible sigmoidoscopy or colonoscopy. You may have a sigmoidoscopy every 5 years or a colonoscopy every 10 years starting at age 38. Hepatitis C blood test. Hepatitis B blood test. Sexually transmitted disease (STD) testing. Diabetes screening. This is done by checking your blood sugar (glucose) after you have not eaten for a while (fasting). You may have this done every 1-3 years. Bone density scan. This is done to screen for osteoporosis. You may have this done starting at age 90. Mammogram. This may be done every 1-2 years. Talk to your health care provider about how often you should have regular mammograms. Talk with your health care provider about your test results, treatment options, and if necessary, the need for more tests. Vaccines  Your health care provider may recommend certain vaccines, such as: Influenza vaccine. This is recommended every year. Tetanus, diphtheria, and acellular pertussis (Tdap, Td) vaccine. You may need a Td booster every 10 years. Zoster vaccine. You may need this after age 52. Pneumococcal 13-valent conjugate (PCV13) vaccine. One dose is recommended after age 107. Pneumococcal polysaccharide (PPSV23) vaccine. One dose is recommended after age 64. Talk to your health care provider about which screenings and vaccines you need and how often you need them. This information is not intended to replace advice given to you by your health care provider. Make sure you discuss any questions you have with your health care provider. Document Released: 11/04/2015 Document Revised: 06/27/2016 Document Reviewed: 08/09/2015 Elsevier Interactive Patient Education  2017 [place] Prevention in the Home Falls can cause injuries. They can happen to people of all ages. There are many things you can do to make your home safe and to help prevent falls. What can I do on the outside of my  home? Regularly fix the edges of walkways and driveways and fix any cracks. Remove anything that might make you trip as you walk through a door, such as a raised step or threshold. Trim any bushes or trees on the path to your home. Use bright outdoor lighting. Clear any walking paths of anything that might make someone trip, such as rocks or tools. Regularly check to see if handrails are loose or broken. Make sure that both sides of any steps have handrails. Any raised decks and porches should have guardrails on the edges. Have any leaves, snow, or ice cleared regularly. Use sand or salt on walking paths during winter. Clean up any spills in your garage right away. This includes oil or grease spills. What can I do in the bathroom? Use night lights. Install grab bars by the toilet and in the tub and shower. Do not use towel bars as grab bars. Use non-skid mats or decals in the tub or shower. If you need to sit down in  the shower, use a plastic, non-slip stool. Keep the floor dry. Clean up any water that spills on the floor as soon as it happens. Remove soap buildup in the tub or shower regularly. Attach bath mats securely with double-sided non-slip rug tape. Do not have throw rugs and other things on the floor that can make you trip. What can I do in the bedroom? Use night lights. Make sure that you have a light by your bed that is easy to reach. Do not use any sheets or blankets that are too big for your bed. They should not hang down onto the floor. Have a firm chair that has side arms. You can use this for support while you get dressed. Do not have throw rugs and other things on the floor that can make you trip. What can I do in the kitchen? Clean up any spills right away. Avoid walking on wet floors. Keep items that you use a lot in easy-to-reach places. If you need to reach something above you, use a strong step stool that has a grab bar. Keep electrical cords out of the way. Do  not use floor polish or wax that makes floors slippery. If you must use wax, use non-skid floor wax. Do not have throw rugs and other things on the floor that can make you trip. What can I do with my stairs? Do not leave any items on the stairs. Make sure that there are handrails on both sides of the stairs and use them. Fix handrails that are broken or loose. Make sure that handrails are as long as the stairways. Check any carpeting to make sure that it is firmly attached to the stairs. Fix any carpet that is loose or worn. Avoid having throw rugs at the top or bottom of the stairs. If you do have throw rugs, attach them to the floor with carpet tape. Make sure that you have a light switch at the top of the stairs and the bottom of the stairs. If you do not have them, ask someone to add them for you. What else can I do to help prevent falls? Wear shoes that: Do not have high heels. Have rubber bottoms. Are comfortable and fit you well. Are closed at the toe. Do not wear sandals. If you use a stepladder: Make sure that it is fully opened. Do not climb a closed stepladder. Make sure that both sides of the stepladder are locked into place. Ask someone to hold it for you, if possible. Clearly mark and make sure that you can see: Any grab bars or handrails. First and last steps. Where the edge of each step is. Use tools that help you move around (mobility aids) if they are needed. These include: Canes. Walkers. Scooters. Crutches. Turn on the lights when you go into a dark area. Replace any light bulbs as soon as they burn out. Set up your furniture so you have a clear path. Avoid moving your furniture around. If any of your floors are uneven, fix them. If there are any pets around you, be aware of where they are. Review your medicines with your doctor. Some medicines can make you feel dizzy. This can increase your chance of falling. Ask your doctor what other things that you can do to  help prevent falls. This information is not intended to replace advice given to you by your health care provider. Make sure you discuss any questions you have with your health care provider. Document Released:  08/04/2009 Document Revised: 03/15/2016 Document Reviewed: 11/12/2014 Elsevier Interactive Patient Education  2017 [employer].

## 2021-12-22 ENCOUNTER — Other Ambulatory Visit: Payer: Self-pay

## 2021-12-22 ENCOUNTER — Ambulatory Visit (INDEPENDENT_AMBULATORY_CARE_PROVIDER_SITE_OTHER): Payer: Medicare Other | Admitting: Internal Medicine

## 2021-12-22 ENCOUNTER — Encounter: Payer: Self-pay | Admitting: Internal Medicine

## 2021-12-22 ENCOUNTER — Other Ambulatory Visit (INDEPENDENT_AMBULATORY_CARE_PROVIDER_SITE_OTHER): Payer: Self-pay | Admitting: Family Medicine

## 2021-12-22 VITALS — BP 124/61 | HR 73 | Ht 64.0 in | Wt 185.0 lb

## 2021-12-22 DIAGNOSIS — I10 Essential (primary) hypertension: Secondary | ICD-10-CM

## 2021-12-22 DIAGNOSIS — I2510 Atherosclerotic heart disease of native coronary artery without angina pectoris: Secondary | ICD-10-CM

## 2021-12-22 DIAGNOSIS — E785 Hyperlipidemia, unspecified: Secondary | ICD-10-CM

## 2021-12-22 DIAGNOSIS — R7303 Prediabetes: Secondary | ICD-10-CM

## 2021-12-22 NOTE — Patient Instructions (Signed)
Medication Instructions:  ?Your physician recommends that you continue on your current medications as directed. Please refer to the Current Medication list given to you today. ? ?*If you need a refill on your cardiac medications before your next appointment, please call your pharmacy* ? ?Follow-Up: ?At [facility], you and your health needs are our priority.  As part of our continuing mission to provide you with exceptional heart care, we have created designated Provider Care Teams.  These Care Teams include your primary Cardiologist (physician) and Advanced Practice Providers (APPs -  Physician Assistants and Nurse Practitioners) who all work together to provide you with the care you need, when you need it. ? ?We recommend signing up for the patient portal called "MyChart".  Sign up information is provided on this After Visit Summary.  MyChart is used to connect with patients for Virtual Visits (Telemedicine).  Patients are able to view lab/test results, encounter notes, upcoming appointments, etc.  Non-urgent messages can be sent to your provider as well.   ?To learn more about what you can do with MyChart, go to [email or web address].   ? ?Your next appointment:   ? ?1 year visit ?

## 2021-12-22 NOTE — Progress Notes (Signed)
OFFICE NOTE  Chief Complaint:  Routine follow-up  Primary Care Physician: Laurey Morale, MD  HPI:  Natalie Hall is a 71 y.o. female who I last saw in the hospital a few weeks ago. She was admitted for an SVT which spontaneously converted in the ER on Cardizem and did not return. Unfortunately she had a non-ST elevation MI with troponin peak of 1.64. She underwent a Lexiscan Myoview which demonstrated a moderate region of reversibility in the mid and apical segments of the inferior lateral [place] consistent with ischemia. EF was 61%. She then was referred to cardiac catheterization which demonstrated the following:  Cardiac Cath 05/24   Prox RCA lesion, 25% stenosed. The left ventricular systolic function is normal. Prox LAD to Mid LAD lesion, 15% stenosed. Mid RCA-1 lesion, 95% stenosed. Mid RCA-2 lesion, 95% stenosed. Post intervention, there is a 0% residual stenosis. SYNERGY DES 3X32 1. Single vessel obstructive CAD 2. Normal LV function 3. Successful stenting of the mid to distal RCA with a DES, SYNERGY DES 3X32 Plan: DAPT for one year. Patient may be a candidate for the TWILIGHT trial in which case study protocol will be followed. Anticipate DC in am.  [name] returns today and is doing quite well. She denies any chest pain or worsening shortness of breath. She is compliant with her medications. She did enroll in the TWILIGHT trial. Unfortunately, she was considering shoulder surgery but this will be delayed due to the necessity of dual antiplatelet therapy with her recent stent. I advised her the Tylenol or her tramadol would be safe medicines used for pain.  07/13/2016  Since I last saw her, she was admitted to the hospital for adenosine-responsive AVNRT. She underwent successful catheter ablation and has not had recurrence. She denies any chest pain or dyspnea. She remains in the TWILIGHT trial with Brillinta. Recent lipid profilw shows LDL remains not at goal at 104. HDL low  at 28. She is on Crestor 10 mg daily.  10/05/2016  [name] returns today for follow-up. She seems to be doing well from a coronary standpoint. She denies any chest pain or worsening dyspnea. 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 She underwent placement of a radioactive seed which was ultimately removed after a week and she is recovering from that procedure. She was off of her aspirin for 1 week but remained on Brilinta. She is still in the twilight trial. Her cholesterol was higher than it had been previously. I increased her Crestor to 20 mg daily and we are awaiting her lipid profile which was recently drawn. She denies any recurrent AVNRT.  06/10/2017  [name] was seen today in follow-up. She was seen in the [facility] for follow-up of the Twilight trial - this has completed. She is now more than one year since drug-eluting stent placement. She is angina free. At this point she will no longer need dual antiplatelet therapy and I advised that she go on aspirin 81 mg daily. Cholesterol is now at goal based on labs 3 months ago which showed total cholesterol 139, triglycerides 177, HL 34 and LDL-C is 69. She is on Crestor 40 mg daily. She is contemplating left shoulder surgery and from my standpoint she is clear for that.  12/30/2017  [name] was seen today in follow-up.  This is an annual follow-up.  Overall she is doing well.  She had a successful shoulder surgery and has good range  of motion in her left shoulder.  She has been under a lot of stress taking care of her mother-in-law who recently passed away.  She did have labs in December which showed total cholesterol 146, triglycerides 196, HDL 32 and LDL 75.  She does report she had some weight gain and decreased activity and I feel like her cholesterol could be better controlled with more vigilant diet.  She denies any recurrent chest pain or worsening shortness of breath.  07/14/2018  [name]  is seen today for routine follow-up. It has been 6 months with a plan for weight loss and dietary changes to get cholesterol to goal. Unfortunately, weight is not significantly different. Repeat lipid profile shows cholesterol is higher with TC 157, TG 241, HDL 28 a nd LDL 81. Her goal LDL is <70. I had not recommended additional medication due to her commitment to making dietary changes, however, at this point, she still has additional residual risk and her cholesterol is actually higher, so I'm recommending therapy.  06/08/2019  [name] is seen today in follow-up.  Overall she seems to be doing well.  She has been working closely with the weight management center at [facility] and is lost a significant weight.  She went from 213 pounds down to 183 in the office today however she says weighs in the 170s at home.  Overall she remains asymptomatic, denies chest pain or worsening shortness of breath.  EKG shows a sinus bradycardia in the 50s today.  Her labs all look excellent recently.  Lipids 2 months ago showed total cholesterol 105, triglycerides 148, HDL 26 and LDL 49. Also, she is seeking clearance for achilles surgery with Dr. Doran Durand.  12/03/2019  [name] is seen today in follow-up.  She continues to do well.  She did have her Achilles tendon repair.  She was not quite as active but recently has picked that up.  She continues to work with the weight management center.  Her weight is slightly trended up but in general is down.  Her cholesterol is much better.  Labs 2 weeks ago showed total cholesterol 110, triglycerides 123, HDL 29 LDL 59.  She denies any chest pain.  She has had no further arrhythmias.   12/14/2020  [name] returns today for follow-up.  Overall she is doing well.  She had a recent lipid profile showing total cholesterol 106, triglycerides 141, [place] 28 and LDL 53.  She was concerned about a mild elevation in ALT to 37.  This is previously better controlled.  She said recent imaging  showed some fatty liver disease.  She said she has a history of a melanoma and was concerned that this could indicate an issue such as a tumor, however I feel that this is very unlikely.  She denies any recurrent tachypalpitations.  Blood pressure was noted to be low today however she recently was on Ozempic but did not tolerate it for weight loss.  12/22/2021  Ms. Nham is seen today in follow-up.  She is doing very well.  She just had some shoulder surgery on her right shoulder and previously had shoulder surgery on the left a number of years ago.  She denies any chest pain or worsening shortness of breath.  Lipids are well controlled with total cholesterol 104, triglycerides 122 HDL 32 and LDL 50.  Liver enzymes are also normal AST and ALT of 17, down from 22 respectively.  PMHx:  Past Medical History:  Diagnosis Date  Asthma    infrequent problem - no treatment since 2006   Back pain    Choroidal nevus of left eye    sees Dr. Gerarda Fraction at [facility].    Constipation    Coronary artery disease    Dysrhythmia    SVT - ablation by Dr. Curt Bears on 05/21/2016 and episodes since   Fatty liver    GERD (gastroesophageal reflux disease)    History of hiatal hernia    "very small"   Hx of colonic polyp    Hyperlipidemia    Hypertension    Joint pain    Melanoma of eye, left ([place])    sees Dr. Cordelia Pen   Myocardial infarction [facility])    Osteoarthritis    Osteoporosis    osteopenia   Shortness of breath    Supraventricular tachycardia ([place])     Past Surgical History:  Procedure Laterality Date   ABDOMINAL HYSTERECTOMY     ACHILLES TENDON REPAIR  10-07-11   torn on right, per Dr. Marge Duncans SURGERY     torn tendon left ankle   BREAST BIOPSY     CARDIAC CATHETERIZATION N/A 03/14/2016   Procedure: Left Heart Cath and Coronary Angiography;  Surgeon: Peter M Martinique, MD;  Location: [place] [facility];  Service: Cardiovascular;  Laterality: N/A;   CARDIAC CATHETERIZATION N/A  03/14/2016   Procedure: Coronary Stent Intervention;  Surgeon: Peter M Martinique, MD;  Location: [place] [facility];  Service: Cardiovascular;  Laterality: N/A;   CARPAL TUNNEL RELEASE     CATARACT EXTRACTION W/ INTRAOCULAR LENS IMPLANT Bilateral    CHOLECYSTECTOMY     COLONOSCOPY  06/23/2014   per Dr. Ardis Hughs, adenomatous polyps, repeat in 7 yrs     ELECTROPHYSIOLOGIC STUDY N/A 05/21/2016   Procedure: SVT Ablation;  Surgeon: [name], MD;  Location: [place] [facility];  Service: Cardiovascular;  Laterality: N/A;   EXCISION HAGLUND'S DEFORMITY WITH ACHILLES TENDON REPAIR Left 07/02/2019   Procedure: Left Achilles tendon debridement and reconstruction and excision of Haglund deformity;  Surgeon: Wylene Simmer, MD;  Location: [place];  Service: Orthopedics;  Laterality: Left;   EYE SURGERY Left 08/2016   GASTROCNEMIUS RECESSION Left 07/02/2019   Procedure: Gastroc recession;  Surgeon: Wylene Simmer, MD;  Location: [place];  Service: Orthopedics;  Laterality: Left;   IR KYPHO THORACIC WITH BONE BIOPSY  12/18/2018   IR RADIOLOGIST EVAL & MGMT  12/09/2018   IR RADIOLOGIST EVAL & MGMT  04/30/2019   KNEE CLOSED REDUCTION Left 02/03/2014   Procedure: CLOSED MANIPULATION LEFT KNEE;  Surgeon: Gearlean Alf, MD;  Location: [facility];  Service: Orthopedics;  Laterality: Left;   POLYPECTOMY     REVERSE SHOULDER ARTHROPLASTY     Left   REVERSE SHOULDER ARTHROPLASTY Left 06/28/2017   Procedure: LEFT SHOULDER REVERSE SHOULDER ARTHROPLASTY;  Surgeon: Netta Cedars, MD;  Location: [place];  Service: Orthopedics;  Laterality: Left;   REVERSE SHOULDER ARTHROPLASTY Right 11/03/2021   Procedure: REVERSE SHOULDER ARTHROPLASTY;  Surgeon: Netta Cedars, MD;  Location: [facility];  Service: Orthopedics;  Laterality: Right;  with ISB   TOTAL KNEE ARTHROPLASTY Left 12/21/2013   Procedure: LEFT TOTAL KNEE ARTHROPLASTY;  Surgeon: Gearlean Alf, MD;  Location: [facility];  Service: Orthopedics;   Laterality: Left;   VARICOSE VEIN SURGERY      FAMHx:  Family History  Problem Relation Age of Onset   Colon polyps Mother    Diabetes  Mother    High blood pressure Mother    High Cholesterol Mother    Thyroid disease Mother    Anxiety disorder Mother    Colon cancer Maternal Aunt    Liver cancer Maternal Aunt    Breast cancer Other    Coronary artery disease Other    Colon cancer Other    Diabetes Other    Hypertension Other    Kidney disease Other    Lung cancer Other    Lung cancer Brother        spleen, bone   Diabetes Father    High blood pressure Father    High Cholesterol Father    Heart disease Father    Depression Father    Obesity Father    Esophageal cancer Neg Hx    Stomach cancer Neg Hx    Rectal cancer Neg Hx     SOCHx:   reports that she has never smoked. She has never used smokeless tobacco. She reports that she does not drink alcohol and does not use drugs.  ALLERGIES:  Allergies  Allergen Reactions   Codeine Shortness Of Breath, Rash and Other (See Comments)    Flushing, tolerates hydrocodone    Levofloxacin In D5w Other (See Comments)    Leg pain     Amoxicillin-Pot Clavulanate Rash     Has patient had a PCN reaction causing immediate rash, facial/tongue/throat swelling, SOB or lightheadedness with hypotension: No Has patient had a PCN reaction causing severe rash involving mucus membranes or skin necrosis: No Has patient had a PCN reaction that required hospitalization: No Has patient had a PCN reaction occurring within the last 10 years: Yes If all of the above answers are "NO", then may proceed with Cephalosporin use.   Escitalopram Oxalate Rash    ROS: Pertinent items noted in HPI and remainder of comprehensive ROS otherwise negative.  HOME MEDS: Current Outpatient Medications  Medication Sig Dispense Refill   acetaminophen (TYLENOL) 500 MG tablet Take 1,000 mg by mouth every 6 (six) hours as needed for moderate pain.      albuterol (VENTOLIN HFA) 108 (90 Base) MCG/ACT inhaler Inhale 2 puffs into the lungs every 4 (four) hours as needed for wheezing or shortness of breath. 18 g 2   aspirin EC 81 MG tablet Take 81 mg by mouth daily.     Calcium Carb-Cholecalciferol 600-800 MG-UNIT TABS Take 2 tablets by mouth daily.     cetirizine (ZYRTEC) 10 MG tablet Take 10 mg by mouth daily as needed for allergies.     Cholecalciferol (VITAMIN D) 50 MCG (2000 UT) CAPS Take 1 capsule (2,000 Units total) by mouth daily. 30 capsule 0   cyclobenzaprine (FLEXERIL) 10 MG tablet Take 1 tablet (10 mg total) by mouth 3 (three) times daily as needed for muscle spasms. 60 tablet 1   ezetimibe (ZETIA) 10 MG tablet TAKE 1 TABLET BY MOUTH  DAILY 90 tablet 3   metFORMIN (GLUCOPHAGE) 500 MG tablet Take 1 tablet (500 mg total) by mouth daily with breakfast. 90 tablet 0   metoprolol succinate (TOPROL-XL) 25 MG 24 hr tablet TAKE 1 TABLET BY MOUTH  TWICE DAILY 180 tablet 3   Multiple Vitamins-Minerals (MULTIVITAL PO) Take 1 tablet by mouth daily.     omeprazole (PRILOSEC) 40 MG capsule TAKE 1 CAPSULE BY MOUTH  DAILY 90 capsule 3   ramipril (ALTACE) 10 MG capsule TAKE 1 CAPSULE BY MOUTH  DAILY 90 capsule 3   rosuvastatin (CRESTOR) 40 MG tablet  TAKE 1 TABLET BY MOUTH  DAILY 90 tablet 3   sodium chloride (OCEAN) 0.65 % SOLN nasal spray Place 1 spray into both nostrils as needed for congestion.     temazepam (RESTORIL) 15 MG capsule Take 1 capsule (15 mg total) by mouth at bedtime. TAKE 1 CAPSULE BY MOUTH  EVERY NIGHT AT BEDTIME AS  NEEDED FOR SLEEP (Patient taking differently: Take 15 mg by mouth at bedtime as needed for sleep.) 90 capsule 1   traMADol (ULTRAM) 50 MG tablet Take 1 tablet (50 mg total) by mouth every 6 (six) hours as needed for moderate pain or severe pain. 30 tablet 0   No current facility-administered medications for this visit.    LABS/IMAGING: No results found for this or any previous visit (from the past 48 hour(s)). No results  found.  WEIGHTS: Wt Readings from Last 3 Encounters:  12/22/21 185 lb (83.9 kg)  12/21/21 186 lb 4.8 oz (84.5 kg)  11/03/21 182 lb 15.7 oz (83 kg)    VITALS: BP 124/61    Pulse 73    Ht 5\' 4"  (1.626 m)    Wt 185 lb (83.9 kg)    LMP  (LMP Unknown)    SpO2 96%    BMI 31.76 kg/m   EXAM: General appearance: alert and no distress Neck: no carotid bruit and no JVD Lungs: clear to auscultation bilaterally Heart: regular rate and rhythm, S1, S2 normal, no murmur, click, rub or gallop Abdomen: soft, non-tender; bowel sounds normal; no masses,  no organomegaly Extremities: extremities normal, atraumatic, no cyanosis or edema Pulses: 2+ and symmetric Skin: Skin color, texture, turgor normal. No rashes or lesions Neurologic: Grossly normal Psych: Pleasant  EKG: Normal sinus rhythm 73-personally reviewed  ASSESSMENT: Coronary artery disease status post PCI with a drug-eluting stent to the mid/distal RCA (02/2016) AVNRT - s/p catheter ablation in 04/2016 S/p NSTEMI with abnormal nuclear stress test Dyslipidemia Hypertension Mildly elevated ALT - hepatic steatosis  PLAN: 1.   Mrs. Teuscher continues to do well without any recurrent chest pain or shortness of breath.  She denies any recurrent arrhythmias.  Cholesterol is very well controlled.  Her blood pressure is also been well controlled if not low recently.  She is working with the [employer] loss program and has had good results.  Liver enzymes have been normal recently.  No medication changes  Follow-up with me annually or sooner as necessary.  Pixie Casino, MD, [facility], [place] Director of the Advanced Lipid Disorders &  Cardiovascular Risk Reduction Clinic Diplomate of the American Board of Clinical Lipidology Attending Cardiologist  Direct Dial: [phone]   Fax: [phone]  Website:  [email or web address] 12/22/2021, 2:09 PM

## 2021-12-25 NOTE — Telephone Encounter (Signed)
Refill request

## 2021-12-28 ENCOUNTER — Other Ambulatory Visit: Payer: Self-pay | Admitting: Family Medicine

## 2022-01-03 ENCOUNTER — Telehealth: Payer: Self-pay

## 2022-01-03 NOTE — Telephone Encounter (Signed)
Unsuccessful attempt to reach patient on preferred number listed in notes for scheduled AWV. Left message on voicemail okay to reschedule. 

## 2022-01-17 ENCOUNTER — Encounter (INDEPENDENT_AMBULATORY_CARE_PROVIDER_SITE_OTHER): Payer: Self-pay | Admitting: Family Medicine

## 2022-01-17 ENCOUNTER — Ambulatory Visit (INDEPENDENT_AMBULATORY_CARE_PROVIDER_SITE_OTHER): Payer: Medicare Other | Admitting: Bariatrics

## 2022-01-17 ENCOUNTER — Encounter (INDEPENDENT_AMBULATORY_CARE_PROVIDER_SITE_OTHER): Payer: Self-pay | Admitting: Bariatrics

## 2022-01-17 ENCOUNTER — Other Ambulatory Visit: Payer: Self-pay

## 2022-01-17 VITALS — BP 133/80 | HR 68 | Temp 97.8°F | Ht 64.0 in | Wt 185.0 lb

## 2022-01-17 DIAGNOSIS — R7303 Prediabetes: Secondary | ICD-10-CM | POA: Diagnosis not present

## 2022-01-17 DIAGNOSIS — E669 Obesity, unspecified: Secondary | ICD-10-CM | POA: Diagnosis not present

## 2022-01-17 DIAGNOSIS — E559 Vitamin D deficiency, unspecified: Secondary | ICD-10-CM

## 2022-01-17 DIAGNOSIS — D619 Aplastic anemia, unspecified: Secondary | ICD-10-CM | POA: Insufficient documentation

## 2022-01-17 DIAGNOSIS — D508 Other iron deficiency anemias: Secondary | ICD-10-CM | POA: Diagnosis not present

## 2022-01-17 DIAGNOSIS — Z6831 Body mass index (BMI) 31.0-31.9, adult: Secondary | ICD-10-CM

## 2022-01-17 MED ORDER — VITAMIN D 50 MCG (2000 UT) PO CAPS: 1.0000 | ORAL_CAPSULE | Freq: Every day | ORAL | 0 refills | Status: AC

## 2022-01-17 MED ORDER — METFORMIN HCL 500 MG PO TABS: 500.0000 mg | ORAL_TABLET | Freq: Every day | ORAL | 0 refills | Status: DC

## 2022-01-18 LAB — CBC WITH DIFFERENTIAL/PLATELET
Basophils Absolute: 0 10*3/uL (ref 0.0–0.2)
Basos: 0 %
EOS (ABSOLUTE): 0.2 10*3/uL (ref 0.0–0.4)
Eos: 3 %
Hematocrit: 38.3 % (ref 34.0–46.6)
Hemoglobin: 12.3 g/dL (ref 11.1–15.9)
Immature Grans (Abs): 0 10*3/uL (ref 0.0–0.1)
Immature Granulocytes: 0 %
Lymphocytes Absolute: 1.6 10*3/uL (ref 0.7–3.1)
Lymphs: 33 %
MCH: 28.7 pg (ref 26.6–33.0)
MCHC: 32.1 g/dL (ref 31.5–35.7)
MCV: 89 fL (ref 79–97)
Monocytes Absolute: 0.4 10*3/uL (ref 0.1–0.9)
Monocytes: 9 %
Neutrophils Absolute: 2.7 10*3/uL (ref 1.4–7.0)
Neutrophils: 55 %
Platelets: 197 10*3/uL (ref 150–450)
RBC: 4.29 x10E6/uL (ref 3.77–5.28)
RDW: 13.7 % (ref 11.7–15.4)
WBC: 4.9 10*3/uL (ref 3.4–10.8)

## 2022-01-18 LAB — FERRITIN: Ferritin: 33 ng/mL (ref 15–150)

## 2022-01-18 NOTE — Telephone Encounter (Signed)
FYI

## 2022-01-18 NOTE — Progress Notes (Signed)
Pt advised of labs.

## 2022-01-18 NOTE — Progress Notes (Signed)
? ? ? ?Chief Complaint:  ? ?OBESITY ?Natalie Hall is here to discuss her progress with her obesity treatment plan along with follow-up of her obesity related diagnoses. Natalie Hall is on the Category 2 Plan and states she is following her eating plan approximately 40% of the time. Natalie Hall states she is doing 0 minutes 0 times per week. ? ?Today's visit was #: 51 ?Starting weight: 213 lbs ?Starting date: 08/04/2018 ?Today's weight: 185 lbs ?Today's date: 01/17/2022 ?Total lbs lost to date: 28 lbs ?Total lbs lost since last in-office visit: 0 ? ?Interim History: Natalie Hall is up 2 lbs since her last visit in 10/2021. She had right shoulder replacement.  ? ?Subjective:  ? ?1. Vitamin D deficiency ?Natalie Hall is taking Vitamin D as directed.  ? ?2. Pre-diabetes ?Natalie Hall is taking Metformin as directed.  ? ?3. Other iron deficiency anemia ?Natalie Hall's  hemoglobin and hematocrit  was 9.5., 29.1.respectively. at her last labs. She has not had a repeat lab. She does have sone fatigue.  ? ?Assessment/Plan:  ? ?1. Vitamin D deficiency ?Low Vitamin D level contributes to fatigue and are associated with obesity, breast, and colon cancer. We will refill prescription Vitamin D 50,000 IU every week for 1 month with no refills and Natalie Hall will follow-up for routine testing of Vitamin D, at least 2-3 times per year to avoid over-replacement. ? ?- Cholecalciferol (VITAMIN D) 50 MCG (2000 UT) CAPS; Take 1 capsule (2,000 Units total) by mouth daily.  Dispense: 30 capsule; Refill: 0 ? ?2. Pre-diabetes ?We will refill Metformin 500 mg for 1 month with no refills. We will check CBC today. Natalie Hall will continue to work on weight loss, exercise, and decreasing simple carbohydrates to help decrease the risk of diabetes.  ? ?- metFORMIN (GLUCOPHAGE) 500 MG tablet; Take 1 tablet (500 mg total) by mouth daily with breakfast.  Dispense: 90 tablet; Refill: 0 ?- CBC with Differential/Platelet ? ?3. Other iron deficiency anemia ?Orders and follow up as documented in patient  record. We will check CBC and ferritin today.  ? ?Counseling ?Iron is essential for our bodies to make red blood cells.  Reasons that someone may be deficient include: an iron-deficient diet (more likely in those following vegan or vegetarian diets), women with heavy menses, patients with GI disorders or poor absorption, patients that have had bariatric surgery, frequent blood donors, patients with cancer, and patients with heart disease.   ?An iron supplement has been recommended. This is found over-the-counter.   ?Iron-rich foods include dark leafy greens, red and white meats, eggs, seafood, and beans.   ?Certain foods and drinks prevent your body from absorbing iron properly. Avoid eating these foods in the same meal as iron-rich foods or with iron supplements. These foods include: coffee, black tea, and red wine; milk, dairy products, and foods that are high in calcium; beans and soybeans; whole grains.  ?Constipation can be a side effect of iron supplementation. Increased water and fiber intake are helpful. Water goal: > 2 liters/day. Fiber goal: > 25 grams/day.  ?- Ferritin ? ?4. Obesity: Current BMI 31.9 ?Natalie Hall is currently in the action stage of change. As such, her goal is to continue with weight loss efforts. She has agreed to the Category 2 Plan.  ? ?Natalie Hall will adhere closely to the plan 80-90%. She will be mindful eating. She will get back on track.  ? ?Exercise goals:  Natalie Hall is doing physical therapy for her shoulder. She has been going to [employer].  ? ?Behavioral modification strategies:  increasing lean protein intake, decreasing simple carbohydrates, increasing vegetables, increasing water intake, decreasing eating out, no skipping meals, meal planning and cooking strategies, keeping healthy foods in the home, and planning for success. ? ?Natalie Hall has agreed to follow-up with our clinic in 4 weeks. She was informed of the importance of frequent follow-up visits to maximize her success with  intensive lifestyle modifications for her multiple health conditions.  ? ?Natalie Hall was informed we would discuss her lab results at her next visit unless there is a critical issue that needs to be addressed sooner. Natalie Hall agreed to keep her next visit at the agreed upon time to discuss these results. ? ?Objective:  ? ?Blood pressure 133/80, pulse 68, temperature 97.8 ?F (36.6 ?C), height '5\' 4"'$  (1.626 m), weight 185 lb (83.9 kg), SpO2 98 %. ?Body mass index is 31.76 kg/m?. ? ?General: Cooperative, alert, well developed, in no acute distress. ?HEENT: Conjunctivae and lids unremarkable. ?Cardiovascular: Regular rhythm.  ?Lungs: Normal work of breathing. ?Neurologic: No focal deficits.  ? ?Lab Results  ?Component Value Date  ? CREATININE 0.53 11/04/2021  ? BUN 16 11/04/2021  ? NA 131 (L) 11/04/2021  ? K 3.7 11/04/2021  ? CL 101 11/04/2021  ? CO2 23 11/04/2021  ? ?Lab Results  ?Component Value Date  ? ALT 17 12/18/2021  ? AST 17 12/18/2021  ? ALKPHOS 57 12/18/2021  ? BILITOT 0.3 12/18/2021  ? ?Lab Results  ?Component Value Date  ? HGBA1C 6.1 (H) 10/20/2021  ? HGBA1C 5.9 04/13/2021  ? HGBA1C 5.5 12/06/2020  ? HGBA1C 6.0 03/31/2020  ? HGBA1C 5.6 11/17/2019  ? ?Lab Results  ?Component Value Date  ? INSULIN 16.6 12/06/2020  ? INSULIN 18.5 11/17/2019  ? INSULIN 15.4 11/20/2018  ? INSULIN 33.5 (H) 08/04/2018  ? ?Lab Results  ?Component Value Date  ? TSH 1.31 04/13/2021  ? ?Lab Results  ?Component Value Date  ? CHOL 104 12/18/2021  ? HDL 32 (L) 12/18/2021  ? [place] 50 12/18/2021  ? LDLDIRECT 96.0 03/11/2018  ? TRIG 122 12/18/2021  ? CHOLHDL 3.3 12/18/2021  ? ?Lab Results  ?Component Value Date  ? VD25OH 51.1 12/06/2020  ? VD25OH 105.10 ([place]) 03/31/2020  ? VD25OH 69.1 11/17/2019  ? ?Lab Results  ?Component Value Date  ? WBC 4.9 01/17/2022  ? HGB 12.3 01/17/2022  ? HCT 38.3 01/17/2022  ? MCV 89 01/17/2022  ? PLT 197 01/17/2022  ? ?Lab Results  ?Component Value Date  ? FERRITIN 33 01/17/2022  ? ?Attestation Statements:  ? ?Reviewed  by clinician on day of visit: allergies, medications, problem list, medical history, surgical history, family history, social history, and previous encounter notes. ? ?I, Lizbeth Bark, RMA, am acting as transcriptionist for [employer], DO. ? ?I have reviewed the above documentation for accuracy and completeness, and I agree with the above. Jearld Lesch, DO ? ?

## 2022-02-19 ENCOUNTER — Ambulatory Visit (INDEPENDENT_AMBULATORY_CARE_PROVIDER_SITE_OTHER): Payer: Medicare Other | Admitting: Bariatrics

## 2022-02-19 ENCOUNTER — Encounter (INDEPENDENT_AMBULATORY_CARE_PROVIDER_SITE_OTHER): Payer: Self-pay | Admitting: Bariatrics

## 2022-02-19 VITALS — BP 135/74 | HR 74 | Temp 97.6°F | Ht 64.0 in | Wt 185.0 lb

## 2022-02-19 DIAGNOSIS — R7303 Prediabetes: Secondary | ICD-10-CM | POA: Diagnosis not present

## 2022-02-19 DIAGNOSIS — Z6831 Body mass index (BMI) 31.0-31.9, adult: Secondary | ICD-10-CM | POA: Diagnosis not present

## 2022-02-19 DIAGNOSIS — I515 Myocardial degeneration: Secondary | ICD-10-CM | POA: Insufficient documentation

## 2022-02-19 DIAGNOSIS — E669 Obesity, unspecified: Secondary | ICD-10-CM | POA: Diagnosis not present

## 2022-02-26 ENCOUNTER — Encounter (INDEPENDENT_AMBULATORY_CARE_PROVIDER_SITE_OTHER): Payer: Self-pay | Admitting: Bariatrics

## 2022-02-26 NOTE — Progress Notes (Signed)
? ? ? ?Chief Complaint:  ? ?OBESITY ?Natalie Hall is here to discuss her progress with her obesity treatment plan along with follow-up of her obesity related diagnoses. Natalie Hall is on the Category 2 Plan and states she is following her eating plan approximately 50-60% of the time. Natalie Hall states she is doing 0 minutes 0 times per week. ? ?Today's visit was #: 52 ?Starting weight: 213 lbs ?Starting date: 08/04/2018 ?Today's weight: 185 lbs ?Today's date: 02/19/2022 ?Total lbs lost to date: 28 lbs ?Total lbs lost since last in-office visit: 0 ? ?Interim History: Natalie Hall's weight remains the same. She has fractured her arm after getting a recent shoulder replacement.  ? ?Subjective:  ? ?1. Pre-diabetes ?Natalie Hall is taking Metformin currently.  ? ?2. Visceral fat ?Natalie Hall's Visceral fat rating ws 14. ? ?Assessment/Plan:  ? ?1. Pre-diabetes ?Natalie Hall will continue taking Metformin. She will continue to work on weight loss, exercise, and decreasing simple carbohydrates to help decrease the risk of diabetes.  ? ?2. Visceral fat ?Desera will continue the plan and she will continue exercise.  ? ?3. Obesity, current BMI 31.8 ?Brizeyda is currently in the action stage of change. As such, her goal is to continue with weight loss efforts. She has agreed to the Category 2 Plan.  ? ?Arvis will continue meal planning and she will continue intentional eating. She will increase her water and protein intake.  ? ?Exercise goals: No exercise has been prescribed at this time. ? ?Behavioral modification strategies: increasing lean protein intake, decreasing simple carbohydrates, increasing vegetables, increasing water intake, decreasing eating out, no skipping meals, meal planning and cooking strategies, keeping healthy foods in the home, and planning for success. ? ?Tiaja has agreed to follow-up with our clinic in 3 weeks. She was informed of the importance of frequent follow-up visits to maximize her success with intensive lifestyle modifications for  her multiple health conditions.  ? ?Objective:  ? ?Blood pressure 135/74, pulse 74, temperature 97.6 ?F (36.4 ?C), height '5\' 4"'$  (1.448 m), weight 185 lb (83.9 kg), SpO2 98 %. ?Body mass index is 31.76 kg/m?. ? ?General: Cooperative, alert, well developed, in no acute distress. ?HEENT: Conjunctivae and lids unremarkable. ?Cardiovascular: Regular rhythm.  ?Lungs: Normal work of breathing. ?Neurologic: No focal deficits.  ? ?Lab Results  ?Component Value Date  ? CREATININE 0.53 11/04/2021  ? BUN 16 11/04/2021  ? NA 131 (L) 11/04/2021  ? K 3.7 11/04/2021  ? CL 101 11/04/2021  ? CO2 23 11/04/2021  ? ?Lab Results  ?Component Value Date  ? ALT 17 12/18/2021  ? AST 17 12/18/2021  ? ALKPHOS 57 12/18/2021  ? BILITOT 0.3 12/18/2021  ? ?Lab Results  ?Component Value Date  ? HGBA1C 6.1 (H) 10/20/2021  ? HGBA1C 5.9 04/13/2021  ? HGBA1C 5.5 12/06/2020  ? HGBA1C 6.0 03/31/2020  ? HGBA1C 5.6 11/17/2019  ? ?Lab Results  ?Component Value Date  ? INSULIN 16.6 12/06/2020  ? INSULIN 18.5 11/17/2019  ? INSULIN 15.4 11/20/2018  ? INSULIN 33.5 (H) 08/04/2018  ? ?Lab Results  ?Component Value Date  ? TSH 1.31 04/13/2021  ? ?Lab Results  ?Component Value Date  ? CHOL 104 12/18/2021  ? HDL 32 (L) 12/18/2021  ? [place] 50 12/18/2021  ? LDLDIRECT 96.0 03/11/2018  ? TRIG 122 12/18/2021  ? CHOLHDL 3.3 12/18/2021  ? ?Lab Results  ?Component Value Date  ? VD25OH 51.1 12/06/2020  ? VD25OH 105.10 ([place]) 03/31/2020  ? VD25OH 69.1 11/17/2019  ? ?Lab Results  ?Component Value Date  ?  WBC 4.9 01/17/2022  ? HGB 12.3 01/17/2022  ? HCT 38.3 01/17/2022  ? MCV 89 01/17/2022  ? PLT 197 01/17/2022  ? ?Lab Results  ?Component Value Date  ? FERRITIN 33 01/17/2022  ? ?Attestation Statements:  ? ?Reviewed by clinician on day of visit: allergies, medications, problem list, medical history, surgical history, family history, social history, and previous encounter notes. ? ?I, Lizbeth Bark, RMA, am acting as transcriptionist for [employer], DO. ? ?I have reviewed the  above documentation for accuracy and completeness, and I agree with the above. Jearld Lesch, DO  ?

## 2022-03-04 ENCOUNTER — Other Ambulatory Visit: Payer: Self-pay | Admitting: Family Medicine

## 2022-03-12 ENCOUNTER — Encounter (INDEPENDENT_AMBULATORY_CARE_PROVIDER_SITE_OTHER): Payer: Self-pay

## 2022-03-12 ENCOUNTER — Ambulatory Visit (INDEPENDENT_AMBULATORY_CARE_PROVIDER_SITE_OTHER): Payer: Medicare Other | Admitting: Family Medicine

## 2022-03-19 ENCOUNTER — Other Ambulatory Visit: Payer: Self-pay | Admitting: Family Medicine

## 2022-04-06 ENCOUNTER — Other Ambulatory Visit: Payer: Self-pay | Admitting: Orthopedic Surgery

## 2022-04-06 DIAGNOSIS — M25511 Pain in right shoulder: Secondary | ICD-10-CM

## 2022-04-12 ENCOUNTER — Ambulatory Visit (INDEPENDENT_AMBULATORY_CARE_PROVIDER_SITE_OTHER): Payer: Medicare Other | Admitting: Bariatrics

## 2022-04-12 ENCOUNTER — Encounter (INDEPENDENT_AMBULATORY_CARE_PROVIDER_SITE_OTHER): Payer: Self-pay | Admitting: Bariatrics

## 2022-04-12 VITALS — BP 113/71 | HR 69 | Temp 98.6°F | Ht 64.0 in | Wt 187.0 lb

## 2022-04-12 DIAGNOSIS — E559 Vitamin D deficiency, unspecified: Secondary | ICD-10-CM

## 2022-04-12 DIAGNOSIS — R7303 Prediabetes: Secondary | ICD-10-CM | POA: Diagnosis not present

## 2022-04-12 DIAGNOSIS — Z6832 Body mass index (BMI) 32.0-32.9, adult: Secondary | ICD-10-CM | POA: Diagnosis not present

## 2022-04-12 DIAGNOSIS — Z7984 Long term (current) use of oral hypoglycemic drugs: Secondary | ICD-10-CM

## 2022-04-12 DIAGNOSIS — E669 Obesity, unspecified: Secondary | ICD-10-CM | POA: Diagnosis not present

## 2022-04-12 LAB — HM MAMMOGRAPHY

## 2022-04-12 MED ORDER — METFORMIN HCL 500 MG PO TABS: 500.0000 mg | ORAL_TABLET | Freq: Every day | ORAL | 0 refills | Status: DC

## 2022-04-17 ENCOUNTER — Encounter (INDEPENDENT_AMBULATORY_CARE_PROVIDER_SITE_OTHER): Payer: Self-pay | Admitting: Bariatrics

## 2022-04-20 ENCOUNTER — Ambulatory Visit
Admission: RE | Admit: 2022-04-20 | Discharge: 2022-04-20 | Disposition: A | Discharge status: Home / Self Care | Payer: Medicare Other | Source: Ambulatory Visit | Attending: Orthopedic Surgery | Admitting: Orthopedic Surgery

## 2022-04-20 ENCOUNTER — Encounter: Payer: Medicare Other | Admitting: Family Medicine

## 2022-04-20 DIAGNOSIS — M25511 Pain in right shoulder: Secondary | ICD-10-CM

## 2022-04-27 ENCOUNTER — Ambulatory Visit (INDEPENDENT_AMBULATORY_CARE_PROVIDER_SITE_OTHER): Payer: Medicare Other

## 2022-04-27 ENCOUNTER — Encounter: Payer: Self-pay | Admitting: Family Medicine

## 2022-04-27 ENCOUNTER — Ambulatory Visit (INDEPENDENT_AMBULATORY_CARE_PROVIDER_SITE_OTHER): Payer: Medicare Other | Admitting: Family Medicine

## 2022-04-27 VITALS — BP 124/70 | HR 59 | Temp 98.7°F | Ht 64.0 in | Wt 192.0 lb

## 2022-04-27 DIAGNOSIS — E8881 Metabolic syndrome: Secondary | ICD-10-CM | POA: Diagnosis not present

## 2022-04-27 DIAGNOSIS — Z85840 Personal history of malignant neoplasm of eye: Secondary | ICD-10-CM

## 2022-04-27 DIAGNOSIS — E559 Vitamin D deficiency, unspecified: Secondary | ICD-10-CM | POA: Diagnosis not present

## 2022-04-27 DIAGNOSIS — I872 Venous insufficiency (chronic) (peripheral): Secondary | ICD-10-CM | POA: Diagnosis not present

## 2022-04-27 DIAGNOSIS — Z6841 Body Mass Index (BMI) 40.0 and over, adult: Secondary | ICD-10-CM

## 2022-04-27 DIAGNOSIS — I10 Essential (primary) hypertension: Secondary | ICD-10-CM

## 2022-04-27 DIAGNOSIS — I700 Atherosclerosis of aorta: Secondary | ICD-10-CM | POA: Insufficient documentation

## 2022-04-27 DIAGNOSIS — I2510 Atherosclerotic heart disease of native coronary artery without angina pectoris: Secondary | ICD-10-CM

## 2022-04-27 DIAGNOSIS — D619 Aplastic anemia, unspecified: Secondary | ICD-10-CM

## 2022-04-27 DIAGNOSIS — K219 Gastro-esophageal reflux disease without esophagitis: Secondary | ICD-10-CM

## 2022-04-27 DIAGNOSIS — R7303 Prediabetes: Secondary | ICD-10-CM

## 2022-04-27 DIAGNOSIS — M159 Polyosteoarthritis, unspecified: Secondary | ICD-10-CM

## 2022-04-27 LAB — CBC WITH DIFFERENTIAL/PLATELET
Basophils Absolute: 0 10*3/uL (ref 0.0–0.1)
Basophils Relative: 0.7 % (ref 0.0–3.0)
Eosinophils Absolute: 0.2 10*3/uL (ref 0.0–0.7)
Eosinophils Relative: 5.3 % — ABNORMAL HIGH (ref 0.0–5.0)
HCT: 37.2 % (ref 36.0–46.0)
Hemoglobin: 12.2 g/dL (ref 12.0–15.0)
Lymphocytes Relative: 29.3 % (ref 12.0–46.0)
Lymphs Abs: 1.2 10*3/uL (ref 0.7–4.0)
MCHC: 32.8 g/dL (ref 30.0–36.0)
MCV: 85.5 fl (ref 78.0–100.0)
Monocytes Absolute: 0.4 10*3/uL (ref 0.1–1.0)
Monocytes Relative: 8.5 % (ref 3.0–12.0)
Neutro Abs: 2.3 10*3/uL (ref 1.4–7.7)
Neutrophils Relative %: 56.2 % (ref 43.0–77.0)
Platelets: 164 10*3/uL (ref 150.0–400.0)
RBC: 4.35 Mil/uL (ref 3.87–5.11)
RDW: 15.4 % (ref 11.5–15.5)
WBC: 4.1 10*3/uL (ref 4.0–10.5)

## 2022-04-27 LAB — HEPATIC FUNCTION PANEL
ALT: 24 U/L (ref 0–35)
AST: 28 U/L (ref 0–37)
Albumin: 4 g/dL (ref 3.5–5.2)
Alkaline Phosphatase: 41 U/L (ref 39–117)
Bilirubin, Direct: 0.1 mg/dL (ref 0.0–0.3)
Total Bilirubin: 0.5 mg/dL (ref 0.2–1.2)
Total Protein: 7.3 g/dL (ref 6.0–8.3)

## 2022-04-27 LAB — BASIC METABOLIC PANEL
BUN: 20 mg/dL (ref 6–23)
CO2: 27 mEq/L (ref 19–32)
Calcium: 9.4 mg/dL (ref 8.4–10.5)
Chloride: 103 mEq/L (ref 96–112)
Creatinine, Ser: 0.64 mg/dL (ref 0.40–1.20)
GFR: 88.57 mL/min (ref >60.00)
Glucose, Bld: 88 mg/dL (ref 70–99)
Potassium: 4.2 mEq/L (ref 3.5–5.1)
Sodium: 136 mEq/L (ref 135–145)

## 2022-04-27 LAB — LIPID PANEL
Cholesterol: 100 mg/dL (ref 0–200)
HDL: 30.9 mg/dL — ABNORMAL LOW (ref >39.00)
LDL Cholesterol: 41 mg/dL (ref 0–99)
NonHDL: 69.13
Total CHOL/HDL Ratio: 3
Triglycerides: 139 mg/dL (ref 0.0–149.0)
VLDL: 27.8 mg/dL (ref 0.0–40.0)

## 2022-04-27 LAB — TSH: TSH: 1.73 u[IU]/mL (ref 0.35–5.50)

## 2022-04-27 LAB — HEMOGLOBIN A1C: Hgb A1c MFr Bld: 6 % (ref 4.6–6.5)

## 2022-04-27 LAB — VITAMIN D 25 HYDROXY (VIT D DEFICIENCY, FRACTURES): VITD: 77.36 ng/mL (ref 30.00–100.00)

## 2022-04-27 NOTE — Progress Notes (Signed)
Subjective:    Patient ID: Natalie Hall, female    DOB: [birth year], 71 y.o.   MRN: [id number]  HPI Here to follow up on issues. She feels well in general. She continues to see Dr. [name] for the right shoulder fracture, and she is doing PT. Her BP has been stable. [facility], and they are requesting her to have some labs drawn today. Her OA is stable. She sleeps well with Temazepam. She sees Dr. Debara Pickett regularly for CAD and a hx of SVT. She asks me about a mention of aortic atherosclerosis on a recent Ct report for there right shoulder.    Review of Systems  Constitutional: Negative.   HENT: Negative.    Eyes: Negative.   Respiratory: Negative.    Cardiovascular: Negative.   Gastrointestinal: Negative.   Genitourinary:  Negative for decreased urine volume, difficulty urinating, dyspareunia, dysuria, enuresis, flank pain, frequency, hematuria, pelvic pain and urgency.  Musculoskeletal:  Positive for arthralgias.  Skin: Negative.   Neurological: Negative.  Negative for headaches.  Psychiatric/Behavioral: Negative.         Objective:   Physical Exam Constitutional:      General: She is not in acute distress.    Appearance: She is well-developed. She is obese.  HENT:     Head: Normocephalic and atraumatic.     Right Ear: External ear normal.     Left Ear: External ear normal.     Nose: Nose normal.     Mouth/Throat:     Pharynx: No oropharyngeal exudate.  Eyes:     General: No scleral icterus.    Conjunctiva/sclera: Conjunctivae normal.     Pupils: Pupils are equal, round, and reactive to light.  Neck:     Thyroid: No thyromegaly.     Vascular: No JVD.  Cardiovascular:     Rate and Rhythm: Normal rate and regular rhythm.     Heart sounds: Normal heart sounds. No murmur heard.    No friction rub. No gallop.  Pulmonary:     Effort: Pulmonary effort is normal. No respiratory distress.     Breath sounds: Normal breath sounds. No wheezing  or rales.  Chest:     Chest Winders: No tenderness.  Abdominal:     General: Bowel sounds are normal. There is no distension.     Palpations: Abdomen is soft. There is no mass.     Tenderness: There is no abdominal tenderness. There is no guarding or rebound.  Musculoskeletal:        General: No tenderness. Normal range of motion.     Cervical back: Normal range of motion and neck supple.  Lymphadenopathy:     Cervical: No cervical adenopathy.  Skin:    General: Skin is warm and dry.     Findings: No erythema or rash.  Neurological:     Mental Status: She is alert and oriented to person, place, and time.     Cranial Nerves: No cranial nerve deficit.     Motor: No abnormal muscle tone.     Coordination: Coordination normal.     Deep Tendon Reflexes: Reflexes are normal and symmetric. Reflexes normal.  Psychiatric:        Behavior: Behavior normal.        Thought Content: Thought content normal.        Judgment: Judgment normal.           Assessment & Plan:  Her CAD and HTN  are well controlled. I explained that the aortic insufficiency oas not a surprise, given her CAD. She will see Dr. Debara Pickett next February. Her OA and insomnia are stable. We wil get fasting labs to check an A1c and an insulin level. Check lipids and renal function. 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 We spent a total of ( 33  ) minutes reviewing records and discussing these issues.  Alysia Penna, MD

## 2022-04-30 ENCOUNTER — Encounter: Payer: Self-pay | Admitting: Internal Medicine

## 2022-04-30 LAB — INSULIN, RANDOM: Insulin: 17.5 u[IU]/mL

## 2022-05-03 ENCOUNTER — Ambulatory Visit
Admission: RE | Admit: 2022-05-03 | Discharge: 2022-05-03 | Disposition: A | Discharge status: Home / Self Care | Payer: Medicare Other | Source: Ambulatory Visit | Attending: Family Medicine | Admitting: Family Medicine

## 2022-05-03 DIAGNOSIS — Z85840 Personal history of malignant neoplasm of eye: Secondary | ICD-10-CM

## 2022-05-09 ENCOUNTER — Encounter (INDEPENDENT_AMBULATORY_CARE_PROVIDER_SITE_OTHER): Payer: Self-pay | Admitting: Bariatrics

## 2022-05-09 ENCOUNTER — Ambulatory Visit (INDEPENDENT_AMBULATORY_CARE_PROVIDER_SITE_OTHER): Payer: Medicare Other | Admitting: Bariatrics

## 2022-05-09 VITALS — BP 121/77 | HR 66 | Temp 97.7°F | Ht 64.0 in | Wt 186.0 lb

## 2022-05-09 DIAGNOSIS — E65 Localized adiposity: Secondary | ICD-10-CM

## 2022-05-09 DIAGNOSIS — E669 Obesity, unspecified: Secondary | ICD-10-CM | POA: Diagnosis not present

## 2022-05-09 DIAGNOSIS — Z6832 Body mass index (BMI) 32.0-32.9, adult: Secondary | ICD-10-CM

## 2022-05-09 DIAGNOSIS — R7303 Prediabetes: Secondary | ICD-10-CM

## 2022-05-09 DIAGNOSIS — I515 Myocardial degeneration: Secondary | ICD-10-CM

## 2022-05-15 NOTE — Progress Notes (Unsigned)
Chief Complaint:   OBESITY Natalie Hall is here to discuss her progress with her obesity treatment plan along with follow-up of her obesity related diagnoses. Natalie Hall is on the Category 2 Plan and states she is following her eating plan approximately 80% of the time. Natalie Hall states she is doing 0 minutes 0 times per week.  Today's visit was #: 56 Starting weight: 213 lbs Starting date: 08/04/2018 Today's weight: 186 lbs Today's date: 05/09/2022 Total lbs lost to date: 27 Total lbs lost since last in-office visit: 1  Interim History: Natalie Hall is down 1 pound since her last visit.  She had Hall birthday celebration.  She is getting water and protein in.  She wants to lose 10 to 20 pounds.  Subjective:   1. Visceral fat Natalie Hall's visceral fat is 14, goal visceral fat is 13.  2. Prediabetes Natalie Hall is currently taking metformin.  Assessment/Plan:   1. Visceral fat Natalie Hall will continue her meal plan and exercise.  2. Prediabetes Natalie Hall will continue metformin, and will continue to work on weight loss, exercise, and decreasing simple carbohydrates to help decrease the risk of diabetes.   3. Obesity, current BMI 32.0 Natalie Hall is currently in the action stage of change. As such, her goal is to continue with weight loss efforts. She has agreed to the Category 2 Plan.   Meal planning and intentional eating were discussed.  Exercise goals: We will continue walking when able to exercise.  Behavioral modification strategies: increasing lean protein intake, decreasing simple carbohydrates, increasing vegetables, increasing water intake, decreasing eating out, no skipping meals, meal planning and cooking strategies, keeping healthy foods in the home, and planning for success.  Natalie Hall has agreed to follow-up with our clinic in 4 weeks. She was informed of the importance of frequent follow-up visits to maximize her success with intensive lifestyle modifications for her multiple health conditions.    Objective:   Blood pressure 121/77, pulse 66, temperature 97.7 F (36.5 C), height '5\' 4"'$  (1.626 m), weight 186 lb (84.4 kg), SpO2 98 %. Body mass index is 31.93 kg/m.  General: Cooperative, alert, well developed, in no acute distress. HEENT: Conjunctivae and lids unremarkable. Cardiovascular: Regular rhythm.  Lungs: Normal work of breathing. Neurologic: No focal deficits.   Lab Results  Component Value Date   CREATININE 0.64 04/27/2022   BUN 20 04/27/2022   NA 136 04/27/2022   K 4.2 04/27/2022   CL 103 04/27/2022   CO2 27 04/27/2022   Lab Results  Component Value Date   ALT 24 04/27/2022   AST 28 04/27/2022   ALKPHOS 41 04/27/2022   BILITOT 0.5 04/27/2022   Lab Results  Component Value Date   HGBA1C 6.0 04/27/2022   HGBA1C 6.1 (H) 10/20/2021   HGBA1C 5.9 04/13/2021   HGBA1C 5.5 12/06/2020   HGBA1C 6.0 03/31/2020   Lab Results  Component Value Date   INSULIN 16.6 12/06/2020   INSULIN 18.5 11/17/2019   INSULIN 15.4 11/20/2018   INSULIN 33.5 (H) 08/04/2018   Lab Results  Component Value Date   TSH 1.73 04/27/2022   Lab Results  Component Value Date   CHOL 100 04/27/2022   HDL 30.90 (L) 04/27/2022   LDLCALC 41 04/27/2022   LDLDIRECT 96.0 03/11/2018   TRIG 139.0 04/27/2022   CHOLHDL 3 04/27/2022   Lab Results  Component Value Date   VD25OH 77.36 04/27/2022   VD25OH 51.1 12/06/2020   VD25OH 105.10 ([facility]) 03/31/2020   Lab Results  Component Value Date  WBC 4.1 04/27/2022   HGB 12.2 04/27/2022   HCT 37.2 04/27/2022   MCV 85.5 04/27/2022   PLT 164.0 04/27/2022   Lab Results  Component Value Date   FERRITIN 33 01/17/2022   Attestation Statements:   Reviewed by clinician on day of visit: allergies, medications, problem list, medical history, surgical history, family history, social history, and previous encounter notes.   [name], am acting as Location manager for [employer], DO.  I have reviewed the above documentation for accuracy  and completeness, and I agree with the above. Jearld Lesch, DO

## 2022-05-16 ENCOUNTER — Encounter (INDEPENDENT_AMBULATORY_CARE_PROVIDER_SITE_OTHER): Payer: Self-pay | Admitting: Bariatrics

## 2022-05-30 ENCOUNTER — Encounter (INDEPENDENT_AMBULATORY_CARE_PROVIDER_SITE_OTHER): Payer: Self-pay

## 2022-06-06 ENCOUNTER — Ambulatory Visit (INDEPENDENT_AMBULATORY_CARE_PROVIDER_SITE_OTHER): Payer: Medicare Other | Admitting: Bariatrics

## 2022-06-08 ENCOUNTER — Encounter: Payer: Self-pay | Admitting: Family Medicine

## 2022-06-08 ENCOUNTER — Ambulatory Visit (INDEPENDENT_AMBULATORY_CARE_PROVIDER_SITE_OTHER): Payer: Medicare Other | Admitting: Family Medicine

## 2022-06-08 VITALS — BP 112/78 | HR 66 | Temp 99.0°F | Wt 192.1 lb

## 2022-06-08 DIAGNOSIS — B356 Tinea cruris: Secondary | ICD-10-CM

## 2022-06-08 MED ORDER — KETOCONAZOLE 2 % EX CREA: 1.0000 | TOPICAL_CREAM | Freq: Two times a day (BID) | CUTANEOUS | 3 refills | Status: DC | PRN

## 2022-06-08 NOTE — Progress Notes (Signed)
   Subjective:    Patient ID: Natalie Hall, female    DOB: 1950-11-04, 71 y.o.   MRN: [id number]  HPI Here for one week of an itchy burning rash between the buttocks. She has never had this before. She has tried applying cortisone cream and Vagisil with no relief.    Review of Systems  Constitutional: Negative.   Respiratory: Negative.    Cardiovascular: Negative.   Skin:  Positive for rash.       Objective:   Physical Exam Constitutional:      Appearance: Normal appearance. She is not ill-appearing.  Cardiovascular:     Rate and Rhythm: Normal rate and regular rhythm.     Pulses: Normal pulses.     Heart sounds: Normal heart sounds.  Pulmonary:     Effort: Pulmonary effort is normal.     Breath sounds: Normal breath sounds.  Skin:    Comments: Red macular rash between the buttocks   Neurological:     Mental Status: She is alert.           Assessment & Plan:  Tinea cruris, treat with Ketoconazole cream BID as needed.  Alysia Penna, MD

## 2022-06-14 ENCOUNTER — Other Ambulatory Visit: Payer: Self-pay

## 2022-06-14 ENCOUNTER — Encounter: Payer: Self-pay | Admitting: Family Medicine

## 2022-06-14 DIAGNOSIS — R21 Rash and other nonspecific skin eruption: Secondary | ICD-10-CM

## 2022-06-14 DIAGNOSIS — R233 Spontaneous ecchymoses: Secondary | ICD-10-CM

## 2022-06-14 MED ORDER — FLUCONAZOLE 150 MG PO TABS: 150.0000 mg | ORAL_TABLET | Freq: Every day | ORAL | 1 refills | Status: DC

## 2022-06-14 MED ORDER — FLUCONAZOLE 150 MG PO TABS: 150.0000 mg | ORAL_TABLET | Freq: Every day | ORAL | 0 refills | Status: DC

## 2022-06-14 NOTE — Addendum Note (Signed)
Addended by: Otilio Miu on: 06/14/2022 02:43 PM   Modules accepted: Orders

## 2022-06-14 NOTE — Telephone Encounter (Signed)
Yes. Call in Diflucan 150 mg daily, #30 with one rf

## 2022-06-19 ENCOUNTER — Other Ambulatory Visit: Payer: Self-pay | Admitting: Family Medicine

## 2022-06-26 NOTE — Telephone Encounter (Signed)
Yes stay on the cream for now

## 2022-07-05 ENCOUNTER — Encounter: Payer: Self-pay | Admitting: Bariatrics

## 2022-07-05 ENCOUNTER — Ambulatory Visit (INDEPENDENT_AMBULATORY_CARE_PROVIDER_SITE_OTHER): Payer: Medicare Other | Admitting: Bariatrics

## 2022-07-05 VITALS — BP 128/77 | HR 63 | Temp 98.4°F | Ht 64.0 in | Wt 188.0 lb

## 2022-07-05 DIAGNOSIS — Z6841 Body Mass Index (BMI) 40.0 and over, adult: Secondary | ICD-10-CM

## 2022-07-05 DIAGNOSIS — Z6832 Body mass index (BMI) 32.0-32.9, adult: Secondary | ICD-10-CM | POA: Diagnosis not present

## 2022-07-05 DIAGNOSIS — R7303 Prediabetes: Secondary | ICD-10-CM

## 2022-07-05 DIAGNOSIS — E559 Vitamin D deficiency, unspecified: Secondary | ICD-10-CM

## 2022-07-05 DIAGNOSIS — E669 Obesity, unspecified: Secondary | ICD-10-CM | POA: Diagnosis not present

## 2022-07-05 MED ORDER — METFORMIN HCL 500 MG PO TABS: 500.0000 mg | ORAL_TABLET | Freq: Every day | ORAL | 0 refills | Status: DC

## 2022-07-11 ENCOUNTER — Encounter: Payer: Self-pay | Admitting: Bariatrics

## 2022-07-11 NOTE — Progress Notes (Signed)
Chief Complaint:   OBESITY Natalie Hall is here to discuss her progress with her obesity treatment plan along with follow-up of her obesity related diagnoses. Natalie Hall is on the Category 2 Plan and states she is following her eating plan approximately 50% of the time. Natalie Hall states she is doing 0 minutes 0 times per week.  Today's visit was #: 55 Starting weight: 213 lbs Starting date: 08/04/2018 Today's weight: 188 lbs Today's date: 07/05/2022 Total lbs lost to date: 25 Total lbs lost since last in-office visit: 0  Interim History: Natalie Hall is up 2 lbs since her last visit. She has not been on the plan. She was cleared for exercise.   Subjective:   1. Prediabetes Natalie Hall is taking metformin.   2. Vitamin D deficiency Natalie Hall is taking Vitamin D.   Assessment/Plan:   1. Prediabetes Natalie Hall will continue metformin 500 mg daily with breakfast, and we will refill for 90 days.   - metFORMIN (GLUCOPHAGE) 500 MG tablet; Take 1 tablet (500 mg total) by mouth daily with breakfast.  Dispense: 90 tablet; Refill: 0  2. Vitamin D deficiency Natalie Hall will continue Vitamin D 2,000 IU once daily.   3. Obesity, current BMI 32.4 Natalie Hall is currently in the action stage of change. As such, her goal is to continue with weight loss efforts. She has agreed to the Category 2 Plan.   Meal planning and intentional planning were discussed. She will increase her water and protein intake. Decrease sugary drinks. We will check fasting labs at her next visit.  Exercise goals: Will go back to Silver sneakers.   Behavioral modification strategies: increasing lean protein intake, decreasing simple carbohydrates, increasing vegetables, increasing water intake, decreasing eating out, no skipping meals, meal planning and cooking strategies, keeping healthy foods in the home, planning for success, and keeping a strict food journal.  Natalie Hall has agreed to follow-up with our clinic in 2 weeks. She was informed of the  importance of frequent follow-up visits to maximize her success with intensive lifestyle modifications for her multiple health conditions.   Objective:   Blood pressure 128/77, pulse 63, temperature 98.4 F (36.9 C), height '5\' 4"'$  (1.626 m), weight 186 lb (84.4 kg), SpO2 97 %. Body mass index is 31.93 kg/m.  General: Cooperative, alert, well developed, in no acute distress. HEENT: Conjunctivae and lids unremarkable. Cardiovascular: Regular rhythm.  Lungs: Normal work of breathing. Neurologic: No focal deficits.   Lab Results  Component Value Date   CREATININE 0.64 04/27/2022   BUN 20 04/27/2022   NA 136 04/27/2022   K 4.2 04/27/2022   CL 103 04/27/2022   CO2 27 04/27/2022   Lab Results  Component Value Date   ALT 24 04/27/2022   AST 28 04/27/2022   ALKPHOS 41 04/27/2022   BILITOT 0.5 04/27/2022   Lab Results  Component Value Date   HGBA1C 6.0 04/27/2022   HGBA1C 6.1 (H) 10/20/2021   HGBA1C 5.9 04/13/2021   HGBA1C 5.5 12/06/2020   HGBA1C 6.0 03/31/2020   Lab Results  Component Value Date   INSULIN 16.6 12/06/2020   INSULIN 18.5 11/17/2019   INSULIN 15.4 11/20/2018   INSULIN 33.5 (H) 08/04/2018   Lab Results  Component Value Date   TSH 1.73 04/27/2022   Lab Results  Component Value Date   CHOL 100 04/27/2022   HDL 30.90 (L) 04/27/2022   LDLCALC 41 04/27/2022   LDLDIRECT 96.0 03/11/2018   TRIG 139.0 04/27/2022   CHOLHDL 3 04/27/2022   Lab Results  Component Value Date   VD25OH 77.36 04/27/2022   VD25OH 51.1 12/06/2020   VD25OH 105.10 ([facility]) 03/31/2020   Lab Results  Component Value Date   WBC 4.1 04/27/2022   HGB 12.2 04/27/2022   HCT 37.2 04/27/2022   MCV 85.5 04/27/2022   PLT 164.0 04/27/2022   Lab Results  Component Value Date   FERRITIN 33 01/17/2022   Attestation Statements:   Reviewed by clinician on day of visit: allergies, medications, problem list, medical history, surgical history, family history, social history, and previous  encounter notes.   [name], am acting as Location manager for [employer], DO.  I have reviewed the above documentation for accuracy and completeness, and I agree with the above. Jearld Lesch, DO

## 2022-07-23 ENCOUNTER — Encounter: Payer: Self-pay | Admitting: Bariatrics

## 2022-07-23 ENCOUNTER — Ambulatory Visit (INDEPENDENT_AMBULATORY_CARE_PROVIDER_SITE_OTHER): Payer: Medicare Other | Admitting: Bariatrics

## 2022-07-23 VITALS — BP 114/73 | HR 65 | Temp 97.9°F | Ht 64.0 in | Wt 188.0 lb

## 2022-07-23 DIAGNOSIS — K5909 Other constipation: Secondary | ICD-10-CM | POA: Diagnosis not present

## 2022-07-23 DIAGNOSIS — E669 Obesity, unspecified: Secondary | ICD-10-CM

## 2022-07-23 DIAGNOSIS — E559 Vitamin D deficiency, unspecified: Secondary | ICD-10-CM | POA: Diagnosis not present

## 2022-07-23 DIAGNOSIS — I10 Essential (primary) hypertension: Secondary | ICD-10-CM | POA: Diagnosis not present

## 2022-07-23 DIAGNOSIS — Z6832 Body mass index (BMI) 32.0-32.9, adult: Secondary | ICD-10-CM

## 2022-07-23 DIAGNOSIS — R7303 Prediabetes: Secondary | ICD-10-CM

## 2022-07-24 NOTE — Progress Notes (Unsigned)
Chief Complaint:   OBESITY Natalie Hall is here to discuss her progress with her obesity treatment plan along with follow-up of her obesity related diagnoses. Natalie Hall is on the Category 2 Plan and states she is following her eating plan approximately 80% of the time. Natalie Hall states she is doing Chief of Staff for 60 minutes 3 times per week.  Today's visit was #: 96 Starting weight: 213 lbs Starting date: 08/04/2018 Today's weight: 188 lbs Today's date: 07/23/2022 Total lbs lost to date: 25 Total lbs lost since last in-office visit: 0  Interim History: Natalie Hall's weight remains the same since her last visit.   Subjective:   1. Vitamin D deficiency Natalie Hall is taking vitamin D OTC daily.  2. Prediabetes Natalie Hall is currently taking metformin.  3. Essential hypertension Natalie Hall's blood pressure is controlled.   4. Other constipation Natalie Hall is taking MiraLAX daily and occasionally taking Dulcolax.  Assessment/Plan:   1. Vitamin D deficiency We will check labs today, and Natalie Hall will continue OTC Vitamin D.  - VITAMIN D 25 Hydroxy (Vit-D Deficiency, Fractures)  2. Prediabetes We will check labs today. Natalie Hall will continue metformin, diet, and exercise, and decreasing simple carbohydrates to help decrease the risk of diabetes.   - Insulin, random  3. Essential hypertension We will check labs today. Natalie Hall is to eliminate added salt, and she will continue her medications as directed.  - Lipid Panel With LDL/HDL Ratio - Comprehensive metabolic panel  4. Other constipation Natalie Hall will continue MiraLAX daily.  She is to increase her water intake and increase her fiber intake.  Fiber sources were discussed.  5. Obesity, current BMI 32.3 Natalie Hall is currently in the action stage of change. As such, her goal is to continue with weight loss efforts. She has agreed to the Category 2 Plan.   No planning was discussed.  She will minimize eating out.  Exercise goals: As is.   Behavioral  modification strategies: increasing lean protein intake, decreasing simple carbohydrates, increasing vegetables, increasing water intake, decreasing eating out, no skipping meals, meal planning and cooking strategies, keeping healthy foods in the home, and planning for success.  Natalie Hall has agreed to follow-up with our clinic in 4 weeks. She was informed of the importance of frequent follow-up visits to maximize her success with intensive lifestyle modifications for her multiple health conditions.   Natalie Hall was informed we would discuss her lab results at her next visit unless there is a critical issue that needs to be addressed sooner. Natalie Hall agreed to keep her next visit at the agreed upon time to discuss these results.  Objective:   Blood pressure 114/73, pulse 65, temperature 97.9 F (36.6 C), height '5\' 4"'$  (1.626 m), weight 188 lb (85.3 kg), SpO2 100 %. Body mass index is 32.27 kg/m.  General: Cooperative, alert, well developed, in no acute distress. HEENT: Conjunctivae and lids unremarkable. Cardiovascular: Regular rhythm.  Lungs: Normal work of breathing. Neurologic: No focal deficits.   Lab Results  Component Value Date   CREATININE 0.64 04/27/2022   BUN 20 04/27/2022   NA 136 04/27/2022   K 4.2 04/27/2022   CL 103 04/27/2022   CO2 27 04/27/2022   Lab Results  Component Value Date   ALT 24 04/27/2022   AST 28 04/27/2022   ALKPHOS 41 04/27/2022   BILITOT 0.5 04/27/2022   Lab Results  Component Value Date   HGBA1C 6.0 04/27/2022   HGBA1C 6.1 (H) 10/20/2021   HGBA1C 5.9 04/13/2021   HGBA1C 5.5 12/06/2020  HGBA1C 6.0 03/31/2020   Lab Results  Component Value Date   INSULIN 16.6 12/06/2020   INSULIN 18.5 11/17/2019   INSULIN 15.4 11/20/2018   INSULIN 33.5 (H) 08/04/2018   Lab Results  Component Value Date   TSH 1.73 04/27/2022   Lab Results  Component Value Date   CHOL 100 04/27/2022   HDL 30.90 (L) 04/27/2022   LDLCALC 41 04/27/2022   LDLDIRECT 96.0  03/11/2018   TRIG 139.0 04/27/2022   CHOLHDL 3 04/27/2022   Lab Results  Component Value Date   VD25OH 77.36 04/27/2022   VD25OH 51.1 12/06/2020   VD25OH 105.10 ([facility]) 03/31/2020   Lab Results  Component Value Date   WBC 4.1 04/27/2022   HGB 12.2 04/27/2022   HCT 37.2 04/27/2022   MCV 85.5 04/27/2022   PLT 164.0 04/27/2022   Lab Results  Component Value Date   FERRITIN 33 01/17/2022   Attestation Statements:   Reviewed by clinician on day of visit: allergies, medications, problem list, medical history, surgical history, family history, social history, and previous encounter notes.   [name], am acting as Location manager for [employer], DO.  I have reviewed the above documentation for accuracy and completeness, and I agree with the above. Jearld Lesch, DO

## 2022-07-25 ENCOUNTER — Encounter: Payer: Self-pay | Admitting: Bariatrics

## 2022-07-25 ENCOUNTER — Encounter (INDEPENDENT_AMBULATORY_CARE_PROVIDER_SITE_OTHER): Payer: Self-pay | Admitting: Bariatrics

## 2022-07-25 DIAGNOSIS — E786 Lipoprotein deficiency: Secondary | ICD-10-CM | POA: Insufficient documentation

## 2022-07-25 LAB — LIPID PANEL WITH LDL/HDL RATIO
Cholesterol, Total: 102 mg/dL (ref 100–199)
HDL: 31 mg/dL — ABNORMAL LOW (ref >39)
LDL Chol Calc (NIH): 50 mg/dL (ref 0–99)
LDL/HDL Ratio: 1.6 ratio (ref 0.0–3.2)
Triglycerides: 116 mg/dL (ref 0–149)
VLDL Cholesterol Cal: 21 mg/dL (ref 5–40)

## 2022-07-25 LAB — COMPREHENSIVE METABOLIC PANEL
ALT: 24 IU/L (ref 0–32)
AST: 30 IU/L (ref 0–40)
Albumin/Globulin Ratio: 1.4 (ref 1.2–2.2)
Albumin: 4.2 g/dL (ref 3.8–4.8)
Alkaline Phosphatase: 48 IU/L (ref 44–121)
BUN/Creatinine Ratio: 25 (ref 12–28)
BUN: 18 mg/dL (ref 8–27)
Bilirubin Total: 0.3 mg/dL (ref 0.0–1.2)
CO2: 22 mmol/L (ref 20–29)
Calcium: 9.7 mg/dL (ref 8.7–10.3)
Chloride: 99 mmol/L (ref 96–106)
Creatinine, Ser: 0.73 mg/dL (ref 0.57–1.00)
Globulin, Total: 2.9 g/dL (ref 1.5–4.5)
Glucose: 86 mg/dL (ref 70–99)
Potassium: 4.6 mmol/L (ref 3.5–5.2)
Sodium: 135 mmol/L (ref 134–144)
Total Protein: 7.1 g/dL (ref 6.0–8.5)
eGFR: 87 mL/min/{1.73_m2} (ref >59)

## 2022-07-25 LAB — VITAMIN D 25 HYDROXY (VIT D DEFICIENCY, FRACTURES): Vit D, 25-Hydroxy: 74.5 ng/mL (ref 30.0–100.0)

## 2022-07-25 LAB — INSULIN, RANDOM: INSULIN: 18.6 u[IU]/mL (ref 2.6–24.9)

## 2022-08-07 ENCOUNTER — Other Ambulatory Visit: Payer: Self-pay | Admitting: Family Medicine

## 2022-08-24 ENCOUNTER — Telehealth: Payer: Self-pay | Admitting: Family Medicine

## 2022-08-24 ENCOUNTER — Other Ambulatory Visit (INDEPENDENT_AMBULATORY_CARE_PROVIDER_SITE_OTHER): Payer: Medicare Other

## 2022-08-24 ENCOUNTER — Other Ambulatory Visit: Payer: Self-pay

## 2022-08-24 DIAGNOSIS — N390 Urinary tract infection, site not specified: Secondary | ICD-10-CM

## 2022-08-24 DIAGNOSIS — R319 Hematuria, unspecified: Secondary | ICD-10-CM

## 2022-08-24 LAB — POC URINALSYSI DIPSTICK (AUTOMATED)
Bilirubin, UA: NEGATIVE
Glucose, UA: NEGATIVE
Ketones, UA: NEGATIVE
Leukocytes, UA: NEGATIVE
Nitrite, UA: NEGATIVE
Protein, UA: NEGATIVE
Spec Grav, UA: <=1.005 — AB (ref 1.010–1.025)
Urobilinogen, UA: 0.2 E.U./dL
pH, UA: 7 (ref 5.0–8.0)

## 2022-08-24 MED ORDER — NITROFURANTOIN MONOHYD MACRO 100 MG PO CAPS: 100.0000 mg | ORAL_CAPSULE | Freq: Two times a day (BID) | ORAL | 0 refills | Status: DC

## 2022-08-24 NOTE — Telephone Encounter (Signed)
She is having urinary urgency and burning

## 2022-08-26 LAB — URINE CULTURE
MICRO NUMBER:: 14143190
Result:: NO GROWTH
SPECIMEN QUALITY:: ADEQUATE

## 2022-08-28 ENCOUNTER — Encounter: Payer: Self-pay | Admitting: Bariatrics

## 2022-08-28 ENCOUNTER — Ambulatory Visit (INDEPENDENT_AMBULATORY_CARE_PROVIDER_SITE_OTHER): Payer: Medicare Other | Admitting: Bariatrics

## 2022-08-28 VITALS — BP 137/73 | HR 66 | Temp 98.2°F | Ht 64.0 in | Wt 187.0 lb

## 2022-08-28 DIAGNOSIS — Z6832 Body mass index (BMI) 32.0-32.9, adult: Secondary | ICD-10-CM | POA: Diagnosis not present

## 2022-08-28 DIAGNOSIS — E669 Obesity, unspecified: Secondary | ICD-10-CM | POA: Diagnosis not present

## 2022-08-28 DIAGNOSIS — E66813 Obesity, class 3: Secondary | ICD-10-CM

## 2022-08-28 DIAGNOSIS — I515 Myocardial degeneration: Secondary | ICD-10-CM | POA: Diagnosis not present

## 2022-08-28 DIAGNOSIS — R7303 Prediabetes: Secondary | ICD-10-CM | POA: Diagnosis not present

## 2022-08-28 MED ORDER — METFORMIN HCL 500 MG PO TABS: 500.0000 mg | ORAL_TABLET | Freq: Every day | ORAL | 0 refills | Status: DC

## 2022-08-31 ENCOUNTER — Institutional Professional Consult (permissible substitution): Payer: Medicare Other | Admitting: Pulmonary Disease

## 2022-09-10 ENCOUNTER — Encounter: Payer: Self-pay | Admitting: Bariatrics

## 2022-09-10 NOTE — Progress Notes (Signed)
Chief Complaint:   OBESITY Zen is here to discuss her progress with her obesity treatment plan along with follow-up of her obesity related diagnoses. Yanin is on the Category 2 Plan and states she is following her eating plan approximately 70% of the time. Anjel states she is doing 0 minutes 0 times per week.  Today's visit was #: 59 Starting weight: 213 lbs Starting date: 08/04/2018 Today's weight: 187 lbs Today's date: 08/28/2022 Total lbs lost to date: 26 Total lbs lost since last in-office visit: 1  Interim History: Mckaela is down 1 lb since her last visit and she has lost 26 lbs overall. She is under stress at this time, as mother is very sick. She is doing well with her water intake.   Subjective:   1. Prediabetes Brit's appetite is normal.   2. Visceral fat Nesa's rating for visceral fat is 14, and goal is at 13 or below.   Assessment/Plan:   1. Prediabetes Francie will continue metformin 500 mg once daily, and we will refill for 90 days.   - metFORMIN (GLUCOPHAGE) 500 MG tablet; Take 1 tablet (500 mg total) by mouth daily with breakfast.  Dispense: 90 tablet; Refill: 0  2. Visceral fat Haizel will continue with weight loss and she will get back into Silver sneakers whenever she is able to.   3. Obesity, current BMI 32.2 [place]oor is currently in the action stage of change. As such, her goal is to continue with weight loss efforts. She has agreed to the Category 2 Plan and keeping a food journal and adhering to recommended goals of 1200 calories and 80 grams of protein daily.   Meal planning and intentional eating were discussed. She is to journal for lunch and dinner if she is eating out.   Exercise goals: Going to [employer] sneakers doing balance and strengthening.   Behavioral modification strategies: increasing lean protein intake, decreasing simple carbohydrates, increasing vegetables, and increasing water intake.  Ruthmary has agreed to follow-up with our  clinic in 4 weeks. She was informed of the importance of frequent follow-up visits to maximize her success with intensive lifestyle modifications for her multiple health conditions.   Objective:   Blood pressure 137/73, pulse 66, temperature 98.2 F (36.8 C), height '5\' 4"'$  (1.626 m), weight 187 lb (84.8 kg), SpO2 99 %. Body mass index is 32.1 kg/m.  General: Cooperative, alert, well developed, in no acute distress. HEENT: Conjunctivae and lids unremarkable. Cardiovascular: Regular rhythm.  Lungs: Normal work of breathing. Neurologic: No focal deficits.   Lab Results  Component Value Date   CREATININE 0.73 07/23/2022   BUN 18 07/23/2022   NA 135 07/23/2022   K 4.6 07/23/2022   CL 99 07/23/2022   CO2 22 07/23/2022   Lab Results  Component Value Date   ALT 24 07/23/2022   AST 30 07/23/2022   ALKPHOS 48 07/23/2022   BILITOT 0.3 07/23/2022   Lab Results  Component Value Date   HGBA1C 6.0 04/27/2022   HGBA1C 6.1 (H) 10/20/2021   HGBA1C 5.9 04/13/2021   HGBA1C 5.5 12/06/2020   HGBA1C 6.0 03/31/2020   Lab Results  Component Value Date   INSULIN 18.6 07/23/2022   INSULIN 16.6 12/06/2020   INSULIN 18.5 11/17/2019   INSULIN 15.4 11/20/2018   INSULIN 33.5 (H) 08/04/2018   Lab Results  Component Value Date   TSH 1.73 04/27/2022   Lab Results  Component Value Date   CHOL 102 07/23/2022   HDL 31 (  L) 07/23/2022   LDLCALC 50 07/23/2022   LDLDIRECT 96.0 03/11/2018   TRIG 116 07/23/2022   CHOLHDL 3 04/27/2022   Lab Results  Component Value Date   VD25OH 74.5 07/23/2022   VD25OH 77.36 04/27/2022   VD25OH 51.1 12/06/2020   Lab Results  Component Value Date   WBC 4.1 04/27/2022   HGB 12.2 04/27/2022   HCT 37.2 04/27/2022   MCV 85.5 04/27/2022   PLT 164.0 04/27/2022   Lab Results  Component Value Date   FERRITIN 33 01/17/2022   Attestation Statements:   Reviewed by clinician on day of visit: allergies, medications, problem list, medical history, surgical  history, family history, social history, and previous encounter notes.   [name], am acting as Location manager for [employer], DO.  I have reviewed the above documentation for accuracy and completeness, and I agree with the above. Jearld Lesch, DO

## 2022-09-11 ENCOUNTER — Other Ambulatory Visit: Payer: Self-pay | Admitting: Family Medicine

## 2022-09-11 NOTE — Telephone Encounter (Signed)
Last OV-06/08/22 Last refill-03/20/22--90 capsules, 1 refill  No future OV scheduled.

## 2022-09-25 ENCOUNTER — Encounter: Payer: Self-pay | Admitting: Family Medicine

## 2022-09-25 ENCOUNTER — Ambulatory Visit: Payer: Medicare Other | Admitting: Bariatrics

## 2022-09-25 ENCOUNTER — Ambulatory Visit (INDEPENDENT_AMBULATORY_CARE_PROVIDER_SITE_OTHER): Payer: Medicare Other | Admitting: Family Medicine

## 2022-09-25 VITALS — BP 110/60 | HR 70 | Temp 98.5°F | Wt 191.0 lb

## 2022-09-25 DIAGNOSIS — R233 Spontaneous ecchymoses: Secondary | ICD-10-CM

## 2022-09-25 DIAGNOSIS — J019 Acute sinusitis, unspecified: Secondary | ICD-10-CM

## 2022-09-25 MED ORDER — METHYLPREDNISOLONE ACETATE 40 MG/ML IJ SUSP
40.0000 mg | Freq: Once | INTRAMUSCULAR | Status: AC
  Administered 2022-09-25: 40 mg via INTRAMUSCULAR

## 2022-09-25 MED ORDER — METHYLPREDNISOLONE 4 MG PO TBPK: ORAL_TABLET | ORAL | 0 refills | Status: DC

## 2022-09-25 MED ORDER — DOXYCYCLINE HYCLATE 100 MG PO CAPS: 100.0000 mg | ORAL_CAPSULE | Freq: Two times a day (BID) | ORAL | 0 refills | Status: AC

## 2022-09-25 MED ORDER — METHYLPREDNISOLONE ACETATE 80 MG/ML IJ SUSP
80.0000 mg | Freq: Once | INTRAMUSCULAR | Status: AC
  Administered 2022-09-25: 80 mg via INTRAMUSCULAR

## 2022-09-25 NOTE — Addendum Note (Signed)
Addended by: Wyvonne Lenz on: 09/25/2022 05:14 PM   Modules accepted: Orders

## 2022-09-25 NOTE — Progress Notes (Signed)
   Subjective:    Patient ID: Natalie Hall, female    DOB: 1950/11/21, 71 y.o.   MRN: [id number]  HPI Here for 2 issues. First about one week ago she developed stuffy head, PND, chest congestion and a dry cough. Some SOB but her inhaler helps with that. No fever. Also about 4 days ago she developed a red rash on both lower legs. This is slightly itchy, not painful. She says she gets this same rash every winter about this time. Typically it goes away with steroids.    Review of Systems  Constitutional: Negative.   HENT:  Positive for congestion, postnasal drip and sinus pressure. Negative for ear pain and sore throat.   Eyes: Negative.   Respiratory:  Positive for cough and shortness of breath. Negative for wheezing.   Skin:  Positive for rash.       Objective:   Physical Exam Constitutional:      Appearance: Normal appearance. She is not ill-appearing.  HENT:     Right Ear: Tympanic membrane, ear canal and external ear normal.     Left Ear: Tympanic membrane, ear canal and external ear normal.     Nose: Nose normal.     Mouth/Throat:     Pharynx: Oropharynx is clear.  Eyes:     Conjunctiva/sclera: Conjunctivae normal.  Pulmonary:     Effort: Pulmonary effort is normal.     Breath sounds: Normal breath sounds.  Lymphadenopathy:     Cervical: No cervical adenopathy.  Skin:    Comments: Both lower legs have a fine red petechial rash. No warmth or tenderness, no swelling   Neurological:     Mental Status: She is alert.           Assessment & Plan:  She has a sinusitis and we will treat this with 10 days of Doxycycline. She also has a recurrent petechial rash on the legs. For this she is given a shot of DepoMedrol, and she will follow this with a Medrol dose pack. Recheck as needed.  Alysia Penna, MD

## 2022-10-08 ENCOUNTER — Other Ambulatory Visit: Payer: Self-pay | Admitting: Internal Medicine

## 2022-10-08 DIAGNOSIS — E782 Mixed hyperlipidemia: Secondary | ICD-10-CM

## 2022-10-08 DIAGNOSIS — I10 Essential (primary) hypertension: Secondary | ICD-10-CM

## 2022-11-06 ENCOUNTER — Ambulatory Visit: Payer: Medicare Other | Admitting: Bariatrics

## 2022-11-21 ENCOUNTER — Ambulatory Visit (INDEPENDENT_AMBULATORY_CARE_PROVIDER_SITE_OTHER): Payer: Medicare Other | Admitting: Bariatrics

## 2022-11-21 ENCOUNTER — Encounter: Payer: Self-pay | Admitting: Bariatrics

## 2022-11-21 VITALS — BP 120/70 | HR 57 | Temp 98.0°F | Ht 64.0 in | Wt 189.0 lb

## 2022-11-21 DIAGNOSIS — I10 Essential (primary) hypertension: Secondary | ICD-10-CM

## 2022-11-21 DIAGNOSIS — Z6832 Body mass index (BMI) 32.0-32.9, adult: Secondary | ICD-10-CM | POA: Diagnosis not present

## 2022-11-21 DIAGNOSIS — E88819 Insulin resistance, unspecified: Secondary | ICD-10-CM | POA: Diagnosis not present

## 2022-11-30 ENCOUNTER — Encounter: Payer: Self-pay | Admitting: Family Medicine

## 2022-12-03 MED ORDER — AZITHROMYCIN 250 MG PO TABS: ORAL_TABLET | ORAL | 0 refills | Status: DC

## 2022-12-03 MED ORDER — NIRMATRELVIR/RITONAVIR (PAXLOVID)TABLET: 3.0000 | ORAL_TABLET | Freq: Two times a day (BID) | ORAL | 0 refills | Status: AC

## 2022-12-03 NOTE — Telephone Encounter (Signed)
I sent in Paxlovid and a Zpack

## 2022-12-05 NOTE — Progress Notes (Unsigned)
Chief Complaint:   OBESITY Natalie Hall is here to discuss her progress with her obesity treatment plan along with follow-up of her obesity related diagnoses. Natalie Hall is on the Category 2 Plan and keeping a food journal and adhering to recommended goals of 1200 calories and 80 grams protein and states she is following her eating plan approximately 50% of the time. Natalie Hall states she is not currently exercising.  Today's visit was #: 68 Starting weight: 213 lbs Starting date: 08/04/2018 Today's weight: 189 lbs Today's date: 11/21/2022 Total lbs lost to date: 24 Total lbs lost since last in-office visit: +2  Interim History: Natalie Hall is up 2 lbs since her last visit on 08/28/2022. She has been experiencing a lot of stress (mother had a stroke). Pt is getting enough protein.  Subjective:   1. Insulin resistance Natalie Hall is taking Metformin.  2. Essential hypertension Natalie Hall is taking Metoprolol. BP stable.  Assessment/Plan:   1. Insulin resistance Continue Metformin.  2. Essential hypertension Continue current medications.  3. Morbid obesity ([place]) 4. BMI 32.0-32.9,adult Meal planning. Intentional eating. Increase water.  Natalie Hall is currently in the action stage of change. As such, her goal is to continue with weight loss efforts. She has agreed to the Category 2 Plan and keeping a food journal and adhering to recommended goals of 1200 calories and 80 grams protein.   Exercise goals:  Do more walking and get back to [employer].  Behavioral modification strategies: increasing lean protein intake, decreasing simple carbohydrates, increasing vegetables, increasing water intake, decreasing eating out, no skipping meals, meal planning and cooking strategies, keeping healthy foods in the home, and planning for success.  Natalie Hall has agreed to follow-up with our clinic in 4 weeks. She was informed of the importance of frequent follow-up visits to maximize her success with intensive  lifestyle modifications for her multiple health conditions.   Objective:   Blood pressure 120/70, pulse (!) 57, temperature 98 F (36.7 C), height 5' 4"$  (1.626 m), weight 189 lb (85.7 kg), SpO2 100 %. Body mass index is 32.44 kg/m.  General: Cooperative, alert, well developed, in no acute distress. HEENT: Conjunctivae and lids unremarkable. Cardiovascular: Regular rhythm.  Lungs: Normal work of breathing. Neurologic: No focal deficits.   Lab Results  Component Value Date   CREATININE 0.73 07/23/2022   BUN 18 07/23/2022   NA 135 07/23/2022   K 4.6 07/23/2022   CL 99 07/23/2022   CO2 22 07/23/2022   Lab Results  Component Value Date   ALT 24 07/23/2022   AST 30 07/23/2022   ALKPHOS 48 07/23/2022   BILITOT 0.3 07/23/2022   Lab Results  Component Value Date   HGBA1C 6.0 04/27/2022   HGBA1C 6.1 (H) 10/20/2021   HGBA1C 5.9 04/13/2021   HGBA1C 5.5 12/06/2020   HGBA1C 6.0 03/31/2020   Lab Results  Component Value Date   INSULIN 18.6 07/23/2022   INSULIN 16.6 12/06/2020   INSULIN 18.5 11/17/2019   INSULIN 15.4 11/20/2018   INSULIN 33.5 (H) 08/04/2018   Lab Results  Component Value Date   TSH 1.73 04/27/2022   Lab Results  Component Value Date   CHOL 102 07/23/2022   HDL 31 (L) 07/23/2022   LDLCALC 50 07/23/2022   LDLDIRECT 96.0 03/11/2018   TRIG 116 07/23/2022   CHOLHDL 3 04/27/2022   Lab Results  Component Value Date   VD25OH 74.5 07/23/2022   VD25OH 77.36 04/27/2022   VD25OH 51.1 12/06/2020   Lab Results  Component Value  Date   WBC 4.1 04/27/2022   HGB 12.2 04/27/2022   HCT 37.2 04/27/2022   MCV 85.5 04/27/2022   PLT 164.0 04/27/2022   Lab Results  Component Value Date   FERRITIN 33 01/17/2022   Attestation Statements:   Reviewed by clinician on day of visit: allergies, medications, problem list, medical history, surgical history, family history, social history, and previous encounter notes.  I, Kathlene November, BS, CMA, am acting as  transcriptionist for [employer], DO.  I have reviewed the above documentation for accuracy and completeness, and I agree with the above. Jearld Lesch, DO

## 2022-12-06 ENCOUNTER — Encounter: Payer: Self-pay | Admitting: Internal Medicine

## 2022-12-06 ENCOUNTER — Encounter: Payer: Self-pay | Admitting: Bariatrics

## 2022-12-19 ENCOUNTER — Ambulatory Visit: Payer: Medicare Other | Attending: Internal Medicine | Admitting: Internal Medicine

## 2022-12-19 ENCOUNTER — Encounter: Payer: Self-pay | Admitting: Internal Medicine

## 2022-12-19 VITALS — BP 108/62 | HR 73 | Ht 64.5 in | Wt 195.0 lb

## 2022-12-19 DIAGNOSIS — I2510 Atherosclerotic heart disease of native coronary artery without angina pectoris: Secondary | ICD-10-CM | POA: Diagnosis not present

## 2022-12-19 DIAGNOSIS — I10 Essential (primary) hypertension: Secondary | ICD-10-CM | POA: Diagnosis not present

## 2022-12-19 DIAGNOSIS — E782 Mixed hyperlipidemia: Secondary | ICD-10-CM

## 2022-12-19 MED ORDER — EZETIMIBE 10 MG PO TABS: 10.0000 mg | ORAL_TABLET | Freq: Every day | ORAL | 3 refills | Status: DC

## 2022-12-19 MED ORDER — METOPROLOL SUCCINATE ER 25 MG PO TB24: 25.0000 mg | ORAL_TABLET | Freq: Two times a day (BID) | ORAL | 3 refills | Status: DC

## 2022-12-19 MED ORDER — RAMIPRIL 10 MG PO CAPS: 10.0000 mg | ORAL_CAPSULE | Freq: Every day | ORAL | 3 refills | Status: DC

## 2022-12-19 MED ORDER — ROSUVASTATIN CALCIUM 40 MG PO TABS: 40.0000 mg | ORAL_TABLET | Freq: Every day | ORAL | 3 refills | Status: DC

## 2022-12-19 NOTE — Patient Instructions (Signed)
Medication Instructions:  NO CHANGES  *If you need a refill on your cardiac medications before your next appointment, please call your pharmacy*   Follow-Up: At [facility], you and your health needs are our priority.  As part of our continuing mission to provide you with exceptional heart care, we have created designated Provider Care Teams.  These Care Teams include your primary Cardiologist (physician) and Advanced Practice Providers (APPs -  Physician Assistants and Nurse Practitioners) who all work together to provide you with the care you need, when you need it.  We recommend signing up for the patient portal called "MyChart".  Sign up information is provided on this After Visit Summary.  MyChart is used to connect with patients for Virtual Visits (Telemedicine).  Patients are able to view lab/test results, encounter notes, upcoming appointments, etc.  Non-urgent messages can be sent to your provider as well.   To learn more about what you can do with MyChart, go to [email or web address].    Your next appointment:    12 months with Dr. Hilty  

## 2022-12-19 NOTE — Progress Notes (Signed)
OFFICE NOTE  Chief Complaint:  Routine follow-up  Primary Care Physician: Laurey Morale, MD  HPI:  Natalie Hall is a 72 y.o. female who I last saw in the hospital a few weeks ago. She was admitted for an SVT which spontaneously converted in the ER on Cardizem and did not return. Unfortunately she had a non-ST elevation MI with troponin peak of 1.64. She underwent a Lexiscan Myoview which demonstrated a moderate region of reversibility in the mid and apical segments of the inferior lateral Lem consistent with ischemia. EF was 61%. She then was referred to cardiac catheterization which demonstrated the following:  Cardiac Cath 05/24   Prox RCA lesion, 25% stenosed. The left ventricular systolic function is normal. Prox LAD to Mid LAD lesion, 15% stenosed. Mid RCA-1 lesion, 95% stenosed. Mid RCA-2 lesion, 95% stenosed. Post intervention, there is a 0% residual stenosis. SYNERGY DES 3X32 1. Single vessel obstructive CAD 2. Normal LV function 3. Successful stenting of the mid to distal RCA with a DES, SYNERGY DES 3X32 Plan: DAPT for one year. Patient may be a candidate for the TWILIGHT trial in which case study protocol will be followed. Anticipate DC in am.  Natalie Hall returns today and is doing quite well. She denies any chest pain or worsening shortness of breath. She is compliant with her medications. She did enroll in the TWILIGHT trial. Unfortunately, she was considering shoulder surgery but this will be delayed due to the necessity of dual antiplatelet therapy with her recent stent. I advised her the Tylenol or her tramadol would be safe medicines used for pain.  07/13/2016  Since I last saw her, she was admitted to the hospital for adenosine-responsive AVNRT. She underwent successful catheter ablation and has not had recurrence. She denies any chest pain or dyspnea. She remains in the TWILIGHT trial with Brillinta. Recent lipid profilw shows LDL remains not at goal at 104. HDL low  at 28. She is on Crestor 10 mg daily.  10/05/2016  Natalie Hall returns today for follow-up. She seems to be doing well from a coronary standpoint. She denies any chest pain or worsening dyspnea. 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 She underwent placement of a radioactive seed which was ultimately removed after a week and she is recovering from that procedure. She was off of her aspirin for 1 week but remained on Brilinta. She is still in the twilight trial. Her cholesterol was higher than it had been previously. I increased her Crestor to 20 mg daily and we are awaiting her lipid profile which was recently drawn. She denies any recurrent AVNRT.  06/10/2017  Natalie Hall was seen today in follow-up. She was seen in the [facility] for follow-up of the Twilight trial - this has completed. She is now more than one year since drug-eluting stent placement. She is angina free. At this point she will no longer need dual antiplatelet therapy and I advised that she go on aspirin 81 mg daily. Cholesterol is now at goal based on labs 3 months ago which showed total cholesterol 139, triglycerides 177, HL 34 and LDL-C is 69. She is on Crestor 40 mg daily. She is contemplating left shoulder surgery and from my standpoint she is clear for that.  12/30/2017  Natalie Hall was seen today in follow-up.  This is an annual follow-up.  Overall she is doing well.  She had a successful shoulder surgery and has good range of  motion in her left shoulder.  She has been under a lot of stress taking care of her mother-in-law who recently passed away.  She did have labs in December which showed total cholesterol 146, triglycerides 196, HDL 32 and LDL 75.  She does report she had some weight gain and decreased activity and I feel like her cholesterol could be better controlled with more vigilant diet.  She denies any recurrent chest pain or worsening shortness of breath.  07/14/2018  Natalie Hall  is seen today for routine follow-up. It has been 6 months with a plan for weight loss and dietary changes to get cholesterol to goal. Unfortunately, weight is not significantly different. Repeat lipid profile shows cholesterol is higher with TC 157, TG 241, HDL 28 a nd LDL 81. Her goal LDL is <70. I had not recommended additional medication due to her commitment to making dietary changes, however, at this point, she still has additional residual risk and her cholesterol is actually higher, so I'm recommending therapy.  06/08/2019  Natalie Hall is seen today in follow-up.  Overall she seems to be doing well.  She has been working closely with the weight management center at [facility] and is lost a significant weight.  She went from 213 pounds down to 183 in the office today however she says weighs in the 170s at home.  Overall she remains asymptomatic, denies chest pain or worsening shortness of breath.  EKG shows a sinus bradycardia in the 50s today.  Her labs all look excellent recently.  Lipids 2 months ago showed total cholesterol 105, triglycerides 148, HDL 26 and LDL 49. Also, she is seeking clearance for achilles surgery with Dr. Doran Durand.  12/03/2019  Natalie Hall is seen today in follow-up.  She continues to do well.  She did have her Achilles tendon repair.  She was not quite as active but recently has picked that up.  She continues to work with the weight management center.  Her weight is slightly trended up but in general is down.  Her cholesterol is much better.  Labs 2 weeks ago showed total cholesterol 110, triglycerides 123, HDL 29 LDL 59.  She denies any chest pain.  She has had no further arrhythmias.   12/14/2020  Natalie Hall returns today for follow-up.  Overall she is doing well.  She had a recent lipid profile showing total cholesterol 106, triglycerides 141, Chelle 28 and LDL 53.  She was concerned about a mild elevation in ALT to 37.  This is previously better controlled.  She said recent imaging  showed some fatty liver disease.  She said she has a history of a melanoma and was concerned that this could indicate an issue such as a tumor, however I feel that this is very unlikely.  She denies any recurrent tachypalpitations.  Blood pressure was noted to be low today however she recently was on Ozempic but did not tolerate it for weight loss.  12/22/2021  Natalie Hall is seen today in follow-up.  She is doing very well.  She just had some shoulder surgery on her right shoulder and previously had shoulder surgery on the left a number of years ago.  She denies any chest pain or worsening shortness of breath.  Lipids are well controlled with total cholesterol 104, triglycerides 122 HDL 32 and LDL 50.  Liver enzymes are also normal AST and ALT of 17, down from 22 respectively.  12/19/2021  Natalie Hall is seen today in follow-up.  Overall  she says she is under a lot of stress caring for her mother but generally feels well.  Blood pressure was low normal today 108/62.  She had a recent lipid profile which showed total cholesterol 102, triglycerides 116, HDL 31 and LDL 50.  She had recent shoulder injury after a fall and imaging which showed aortic atherosclerosis this caused her some concern however I reassured her she is already on appropriate therapy for this.  PMHx:  Past Medical History:  Diagnosis Date   Asthma    infrequent problem - no treatment since 2006   Back pain    Choroidal nevus of left eye    sees Dr. Gerarda Fraction at [place].    Constipation    Coronary artery disease    Dysrhythmia    SVT - ablation by Dr. Curt Bears on 05/21/2016 and episodes since   Fatty liver    GERD (gastroesophageal reflux disease)    History of hiatal hernia    "very small"   Hx of colonic polyp    Hyperlipidemia    Hypertension    Joint pain    Melanoma of eye, left ([place])    sees Dr. Cordelia Pen   Myocardial infarction [facility])    Osteoarthritis    Osteoporosis    osteopenia   Shortness of breath     Supraventricular tachycardia     Past Surgical History:  Procedure Laterality Date   ABDOMINAL HYSTERECTOMY     ACHILLES TENDON REPAIR  10/07/2011   torn on right, per Dr. Marge Duncans SURGERY     torn tendon left ankle   BREAST BIOPSY     CARDIAC CATHETERIZATION N/A 03/14/2016   Procedure: Left Heart Cath and Coronary Angiography;  Surgeon: Peter M Martinique, MD;  Location: [place] [facility];  Service: Cardiovascular;  Laterality: N/A;   CARDIAC CATHETERIZATION N/A 03/14/2016   Procedure: Coronary Stent Intervention;  Surgeon: Peter M Martinique, MD;  Location: [place] [facility];  Service: Cardiovascular;  Laterality: N/A;   CARPAL TUNNEL RELEASE     CATARACT EXTRACTION W/ INTRAOCULAR LENS IMPLANT Bilateral    CHOLECYSTECTOMY     COLONOSCOPY  08/14/2021   per Dr. Ardis Hughs, adenomatous polyps, repeat in 7 yrs     ELECTROPHYSIOLOGIC STUDY N/A 05/21/2016   Procedure: SVT Ablation;  Surgeon: [name], MD;  Location: [place] [facility];  Service: Cardiovascular;  Laterality: N/A;   EXCISION HAGLUND'S DEFORMITY WITH ACHILLES TENDON REPAIR Left 07/02/2019   Procedure: Left Achilles tendon debridement and reconstruction and excision of Haglund deformity;  Surgeon: Wylene Simmer, MD;  Location: [place];  Service: Orthopedics;  Laterality: Left;   EYE SURGERY Left 08/2016   GASTROCNEMIUS RECESSION Left 07/02/2019   Procedure: Gastroc recession;  Surgeon: Wylene Simmer, MD;  Location: [place];  Service: Orthopedics;  Laterality: Left;   IR KYPHO THORACIC WITH BONE BIOPSY  12/18/2018   IR RADIOLOGIST EVAL & MGMT  12/09/2018   IR RADIOLOGIST EVAL & MGMT  04/30/2019   KNEE CLOSED REDUCTION Left 02/03/2014   Procedure: CLOSED MANIPULATION LEFT KNEE;  Surgeon: Gearlean Alf, MD;  Location: [facility];  Service: Orthopedics;  Laterality: Left;   POLYPECTOMY     REVERSE SHOULDER ARTHROPLASTY     Left   REVERSE SHOULDER ARTHROPLASTY Left 06/28/2017    Procedure: LEFT SHOULDER REVERSE SHOULDER ARTHROPLASTY;  Surgeon: Netta Cedars, MD;  Location: [place];  Service: Orthopedics;  Laterality: Left;   REVERSE SHOULDER  ARTHROPLASTY Right 11/03/2021   Procedure: REVERSE SHOULDER ARTHROPLASTY;  Surgeon: Netta Cedars, MD;  Location: [facility];  Service: Orthopedics;  Laterality: Right;  with ISB   TOTAL KNEE ARTHROPLASTY Left 12/21/2013   Procedure: LEFT TOTAL KNEE ARTHROPLASTY;  Surgeon: Gearlean Alf, MD;  Location: [facility];  Service: Orthopedics;  Laterality: Left;   VARICOSE VEIN SURGERY      FAMHx:  Family History  Problem Relation Age of Onset   Colon polyps Mother    Diabetes Mother    High blood pressure Mother    High Cholesterol Mother    Thyroid disease Mother    Anxiety disorder Mother    Colon cancer Maternal Aunt    Liver cancer Maternal Aunt    Breast cancer Other    Coronary artery disease Other    Colon cancer Other    Diabetes Other    Hypertension Other    Kidney disease Other    Lung cancer Other    Lung cancer Brother        spleen, bone   Diabetes Father    High blood pressure Father    High Cholesterol Father    Heart disease Father    Depression Father    Obesity Father    Esophageal cancer Neg Hx    Stomach cancer Neg Hx    Rectal cancer Neg Hx     SOCHx:   reports that she has never smoked. She has never used smokeless tobacco. She reports that she does not drink alcohol and does not use drugs.  ALLERGIES:  Allergies  Allergen Reactions   Codeine Shortness Of Breath, Rash and Other (See Comments)    Flushing, tolerates hydrocodone    Levofloxacin In D5w Other (See Comments)    Leg pain     Amoxicillin-Pot Clavulanate Rash     Has patient had a PCN reaction causing immediate rash, facial/tongue/throat swelling, SOB or lightheadedness with hypotension: No Has patient had a PCN reaction causing severe rash involving mucus membranes or skin necrosis: No Has patient had a PCN reaction that required  hospitalization: No Has patient had a PCN reaction occurring within the last 10 years: Yes If all of the above answers are "NO", then may proceed with Cephalosporin use.   Escitalopram Oxalate Rash    ROS: Pertinent items noted in HPI and remainder of comprehensive ROS otherwise negative.  HOME MEDS: Current Outpatient Medications  Medication Sig Dispense Refill   acetaminophen (TYLENOL) 500 MG tablet Take 1,000 mg by mouth every 6 (six) hours as needed for moderate pain.     albuterol (VENTOLIN HFA) 108 (90 Base) MCG/ACT inhaler Inhale 2 puffs into the lungs every 4 (four) hours as needed for wheezing or shortness of breath. 18 g 2   aspirin (RA ASPIRIN CHILDRENS) 81 MG chewable tablet Chew 81 mg by mouth once.     azithromycin (ZITHROMAX Z-PAK) 250 MG tablet As directed 6 each 0   Calcium Carb-Cholecalciferol 600-800 MG-UNIT TABS Take 2 tablets by mouth daily.     cetirizine (ZYRTEC) 10 MG tablet Take 10 mg by mouth daily as needed for allergies.     cetirizine (ZYRTEC) 10 MG tablet Take 10 mg by mouth daily. PRN     Cholecalciferol (VITAMIN D) 50 MCG (2000 UT) CAPS Take 1 capsule (2,000 Units total) by mouth daily. 30 capsule 0   cholecalciferol 25 MCG (1000 UT) tablet Take 1,000 Units by mouth daily.     ezetimibe (ZETIA) 10 MG tablet  TAKE 1 TABLET BY MOUTH DAILY 90 tablet 1   ezetimibe (ZETIA) 10 MG tablet Take 10 mg by mouth daily.     metFORMIN (GLUCOPHAGE) 500 MG tablet Take 1 tablet (500 mg total) by mouth daily with breakfast. 90 tablet 0   metoprolol succinate (TOPROL-XL) 25 MG 24 hr tablet TAKE 1 TABLET BY MOUTH TWICE  DAILY 180 tablet 1   metoprolol tartrate (LOPRESSOR) 25 MG tablet Take 12.5 mg by mouth 2 (two) times daily. PT NOT QUITE SURE OF THIS MED     Multiple Vitamins-Minerals (MULTIVITAL PO) Take 1 tablet by mouth daily.     MYRBETRIQ 50 MG TB24 tablet Take 50 mg by mouth daily.     omeprazole (PRILOSEC) 40 MG capsule TAKE 1 CAPSULE BY MOUTH DAILY 90 capsule 1    Polyethyl Glycol-Propyl Glycol (SYSTANE ULTRA) 0.4-0.3 % SOLN Apply to eye. PRN     ramipril (ALTACE) 10 MG capsule TAKE 1 CAPSULE BY MOUTH DAILY 90 capsule 1   rosuvastatin (CRESTOR) 40 MG tablet TAKE 1 TABLET BY MOUTH DAILY 90 tablet 1   temazepam (RESTORIL) 15 MG capsule TAKE 1 CAPSULE BY MOUTH EVERY  NIGHT AT BEDTIME AS NEEDED FOR  SLEEP 90 capsule 1   No current facility-administered medications for this visit.    LABS/IMAGING: No results found for this or any previous visit (from the past 48 hour(s)). No results found.  WEIGHTS: Wt Readings from Last 3 Encounters:  12/19/22 195 lb (88.5 kg)  11/21/22 189 lb (85.7 kg)  09/25/22 191 lb (86.6 kg)    VITALS: BP 108/62 (BP Location: Left Arm, Patient Position: Sitting, Cuff Size: Normal)   Pulse 73   Ht 5' 4.5" (1.638 m)   Wt 195 lb (88.5 kg)   LMP  (LMP Unknown)   SpO2 96%   BMI 32.95 kg/m   EXAM: General appearance: alert and no distress Neck: no carotid bruit and no JVD Lungs: clear to auscultation bilaterally Heart: regular rate and rhythm, S1, S2 normal, no murmur, click, rub or gallop Abdomen: soft, non-tender; bowel sounds normal; no masses,  no organomegaly Extremities: extremities normal, atraumatic, no cyanosis or edema Pulses: 2+ and symmetric Skin: Skin color, texture, turgor normal. No rashes or lesions Neurologic: Grossly normal Psych: Pleasant  EKG: Normal sinus rhythm at 73, low voltage QRS-personally reviewed  ASSESSMENT: Coronary artery disease status post PCI with a drug-eluting stent to the mid/distal RCA (02/2016) AVNRT - s/p catheter ablation in 04/2016 S/p NSTEMI with abnormal nuclear stress test Dyslipidemia Hypertension Mildly elevated ALT - hepatic steatosis  PLAN: 1.   Natalie Hall seems to be doing well on her current medications.  Her cholesterol is well-controlled.  Blood pressure is at goal.  She denies any chest pain.  Plan follow-up with me annually or sooner as necessary.  Pixie Casino, MD, [facility], [place] Director of the Advanced Lipid Disorders &  Cardiovascular Risk Reduction Clinic Diplomate of the American Board of Clinical Lipidology Attending Cardiologist  Direct Dial: [phone]  Fax: [phone]  Website:  [email or web address] Pippins 12/19/2022, 11:04 AM

## 2022-12-20 ENCOUNTER — Ambulatory Visit: Payer: Medicare Other | Admitting: Bariatrics

## 2022-12-27 ENCOUNTER — Encounter: Payer: Self-pay | Admitting: Family Medicine

## 2022-12-27 ENCOUNTER — Telehealth (INDEPENDENT_AMBULATORY_CARE_PROVIDER_SITE_OTHER): Payer: Medicare Other | Admitting: Family Medicine

## 2022-12-27 DIAGNOSIS — Z0000 Encounter for general adult medical examination without abnormal findings: Secondary | ICD-10-CM | POA: Diagnosis not present

## 2022-12-27 NOTE — Patient Instructions (Addendum)
I really enjoyed getting to talk with you today! I am available on Tuesdays and Thursdays for virtual visits if you have any questions or concerns, or if I can be of any further assistance.   CHECKLIST FROM ANNUAL WELLNESS VISIT:  -Follow up (please call to schedule if not scheduled after visit):   -yearly for annual wellness visit with primary care office  Here is a list of your preventive care/health maintenance measures and the plan for each if any are due:  Health Maintenance  Topic Date Due   Pneumonia Vaccine 75+ Years old (3 of 3 - PPSV23 or PCV20) 10/22/2020   COVID-19 Vaccine (8 - 2023-24 season) 07/15/2023 (Originally 09/08/2022)   MAMMOGRAM  04/13/2023   Medicare Annual Wellness (AWV)  12/27/2023   DTaP/Tdap/Td (2 - Td or Tdap) 04/05/2025   COLONOSCOPY (Pts 45-26yr Insurance coverage will need to be confirmed)  08/14/2028   INFLUENZA VACCINE  Completed   DEXA SCAN  Due in April of 2022   Hepatitis C Screening  Completed   Zoster Vaccines- Shingrix  Completed   HPV VACCINES  Aged Out    -See a dentist at least yearly  -Get your eyes checked and then per your eye specialist's recommendations  -Other issues addressed today:   -I have included below further information regarding a healthy whole foods based diet, physical activity guidelines for adults, stress management and opportunities for social connections. I hope you find this information useful.   -----------------------------------------------------------------------------------------------------------------------------------------------------------------------------------------------------------------------------------------------------------  NUTRITION: -eat real food: lots of colorful vegetables (half the plate) and fruits -5-7 servings of vegetables and fruits per day (fresh or steamed is best), exp. 2 servings of vegetables with lunch and dinner and 2 servings of fruit per day. Berries and greens such as kale  and collards are great choices.  -consume on a regular basis: whole grains (make sure first ingredient on label contains the word "whole"), fresh fruits, fish, nuts, seeds, healthy oils (such as olive oil, avocado oil, grape seed oil) -may eat small amounts of dairy and lean meat on occasion, but avoid processed meats such as ham, bacon, lunch meat, etc. -drink water -try to avoid fast food and pre-packaged foods, processed meat -most experts advise limiting sodium to < '2300mg'$  per day, should limit further is any chronic conditions such as high blood pressure, heart disease, diabetes, etc. The American Heart Association advised that < '1500mg'$  is is ideal -try to avoid foods that contain any ingredients with names you do not recognize  -try to avoid sugar/sweets (except for the natural sugar that occurs in fresh fruit) -try to avoid sweet drinks -try to avoid white rice, white bread, pasta (unless whole grain), white or yellow potatoes  EXERCISE GUIDELINES FOR ADULTS: -if you wish to increase your physical activity, do so gradually and with the approval of your doctor -STOP and seek medical care immediately if you have any chest pain, chest discomfort or trouble breathing when starting or increasing exercise  -move and stretch your body, legs, feet and arms when sitting for long periods -Physical activity guidelines for optimal health in adults: -least 150 minutes per week of aerobic exercise (can talk, but not sing) once approved by your doctor, 20-30 minutes of sustained activity or two 10 minute episodes of sustained activity every day.  -resistance training at least 2 days per week if approved by your doctor -balance exercises 3+ days per week:   Stand somewhere where you have something sturdy to hold onto if you lose balance.  1) lift up on toes, start with 5x per day and work up to 20x   2) stand and lift on leg straight out to the side so that foot is a few inches of the floor, start with  5x each side and work up to 20x each side   3) stand on one foot, start with 5 seconds each side and work up to 20 seconds on each side  If you need ideas or help with getting more active:  -Silver sneakers https://tools.silversneakers.com  -Walk with a Doc: http://stephens-thompson.biz/  -try to include resistance (weight lifting/strength building) and balance exercises twice per week: or the following link for ideas: ChessContest.fr  UpdateClothing.com.cy  STRESS MANAGEMENT: -can try meditating, or just sitting quietly with deep breathing while intentionally relaxing all parts of your body for 5 minutes daily -if you need further help with stress, anxiety or depression please follow up with your primary doctor or contact the wonderful folks at [place]: [place]: -options in [place] if you wish to engage in more social and exercise related activities:  -Silver sneakers https://tools.silversneakers.com  -Walk with a Doc: http://stephens-thompson.biz/  -Check out the [place] 50+ section on the [place] [employer] (hiking clubs, book clubs, cards and games, chess, exercise classes, aquatic classes and much more) - see the website for details: https://www.[place].gov/departments/parks-recreation/active-adults50  -YouTube has lots of exercise videos for different ages and abilities as well  -[place] (a variety of indoor and outdoor inperson activities for adults). (514)485-5421. [address].  -Virtual Online Classes (a variety of topics): see seniorplanet.org or call 820-474-3627  -consider volunteering at a school, hospice center, church, senior center or elsewhere

## 2022-12-27 NOTE — Progress Notes (Signed)
PATIENT CHECK-IN and HEALTH RISK ASSESSMENT QUESTIONNAIRE:  -completed by phone/video for upcoming Medicare Preventive Visit  Pre-Visit Check-in: 1)Vitals (height, wt, BP, etc) - record in vitals section for visit on day of visit 2)Review and Update Medications, Allergies PMH, Surgeries, Social history in Epic 3)Hospitalizations in the last year with date/reason?  No   4)Review and Update Care Team (patient's specialists) in Epic 5) Complete PHQ9 in Epic  6) Complete Fall Screening in Epic 7)Review all Health Maintenance Due and order under PCP if not done.  8)Medicare Wellness Questionnaire: Answer theses question about your habits: Do you drink alcohol? No  If yes, how many drinks do you have a day?No  Have you ever smoked?No  Quit date if applicable?  No  How many packs a day do/did you smoke? N/A Do you use smokeless tobacco?No Do you use an illicit drugs?No Do you exercises? No - stays pretty busy with caring for yard, house and mother,  Are you sexually active? No Number of partners?N/A Goes to [employer] [facility] - working on diet.  Typical breakfast: cereal, yogurt, eggs or waffle Typical lunch:sandwich Typical dinner:protein , and two vegetables Typical snacks:cookies, yogurt bars   Beverages: water, juice. diet ginger ale. Sometimes sweet tea, or pespi,   Answer theses question about you: Can you perform most household chores?Yes Do you find it hard to follow a conversation in a noisy room?No  Do you often ask people to speak up or repeat themselves?No  Do you feel that you have a problem with memory?No  Do you balance your checkbook and or bank acounts?Husband handles bank account Do you feel safe at home?Yes Last dentist visit?6 months ago Do you need assistance with any of the following: Please note if so   Driving?-no  Feeding yourself?-no  Getting from bed to chair?-no   Getting to the toilet?-no   Bathing or showering?-no  Dressing yourself?no    Managing money?-no  Climbing a flight of stairs-Yes  Preparing meals?-no  Do you have Advanced Directives in place (Living Will, Healthcare Power or Attorney)? Yes   Last eye Exam and location?Feb 20th 2024, [facility] on [address] Cordelia Pen   Do you currently use prescribed or non-prescribed narcotic or opioid pain medications?No  Do you have a history or close family history of breast, ovarian, tubal or peritoneal cancer or a family member with BRCA (breast cancer susceptibility 1 and 2) gene mutations?  Nurse/Assistant Credentials/time stamp:St   ----------------------------------------------------------------------------------------------------------------------------------------------------------------------------------------------------------------------   MEDICARE ANNUAL PREVENTIVE VISIT WITH PROVIDER: (Welcome to [employer], initial annual wellness or annual wellness exam)  Virtual Visit via Phone Note  I connected with Natalie Hall on 12/27/22 by phone and verified that I am speaking with the correct person using two identifiers.  Location patient: home Location provider:work or home office Persons participating in the virtual visit: patient, provider  Concerns and/or follow up today: reports things are stable other then seasonal allergies. Reports had eyes and heart check ups recently and all was good. Some stress with caring for mother who had several strokes recently - finally mother is in ALF and she is hoping things will calm down.   See HM section in Epic for other details of completed HM.    ROS: negative for report of fevers, unintentional weight loss, vision changes, vision loss, hearing loss or change, chest pain, sob, hemoptysis, melena, hematochezia, hematuria, genital discharge or lesions, falls, bleeding or bruising, loc, thoughts of suicide or self harm, memory loss  Patient-completed  extensive health risk assessment - reviewed and discussed with the  patient: See Health Risk Assessment completed with patient prior to the visit either above or in recent phone note. This was reviewed in detailed with the patient today and appropriate recommendations, orders and referrals were placed as needed per Summary below and patient instructions.   Review of Medical History: -PMH, PSH, Family History and current specialty and care providers reviewed and updated and listed below   Patient Care Team: Laurey Morale, MD as PCP - General Hilty, Nadean Corwin, MD as PCP - Cardiology (Cardiology) Netta Cedars, MD as Consulting Physician (Orthopedic Surgery) Ortho, [facility] (Specialist) Pixie Casino, MD as Consulting Physician (Cardiology) [place], Jennefer Bravo, MD as Referring Physician (Dermatology)   Past Medical History:  Diagnosis Date   Asthma    infrequent problem - no treatment since 2006   Back pain    Choroidal nevus of left eye    sees Dr. Gerarda Fraction at [place].    Constipation    Coronary artery disease    Dysrhythmia    SVT - ablation by Dr. Curt Bears on 05/21/2016 and episodes since   Fatty liver    GERD (gastroesophageal reflux disease)    History of hiatal hernia    "very small"   Hx of colonic polyp    Hyperlipidemia    Hypertension    Joint pain    Melanoma of eye, left ([place])    sees Dr. Cordelia Pen   Myocardial infarction [facility])    Osteoarthritis    Osteoporosis    osteopenia   Shortness of breath    Supraventricular tachycardia     Past Surgical History:  Procedure Laterality Date   ABDOMINAL HYSTERECTOMY     ACHILLES TENDON REPAIR  10/07/2011   torn on right, per Dr. Marge Duncans SURGERY     torn tendon left ankle   BREAST BIOPSY     CARDIAC CATHETERIZATION N/A 03/14/2016   Procedure: Left Heart Cath and Coronary Angiography;  Surgeon: Peter M Martinique, MD;  Location: [place] [facility];  Service: Cardiovascular;  Laterality: N/A;   CARDIAC CATHETERIZATION N/A 03/14/2016   Procedure: Coronary Stent  Intervention;  Surgeon: Peter M Martinique, MD;  Location: [place] [facility];  Service: Cardiovascular;  Laterality: N/A;   CARPAL TUNNEL RELEASE     CATARACT EXTRACTION W/ INTRAOCULAR LENS IMPLANT Bilateral    CHOLECYSTECTOMY     COLONOSCOPY  08/14/2021   per Dr. Ardis Hughs, adenomatous polyps, repeat in 7 yrs     ELECTROPHYSIOLOGIC STUDY N/A 05/21/2016   Procedure: SVT Ablation;  Surgeon: [name], MD;  Location: [place] [facility];  Service: Cardiovascular;  Laterality: N/A;   EXCISION HAGLUND'S DEFORMITY WITH ACHILLES TENDON REPAIR Left 07/02/2019   Procedure: Left Achilles tendon debridement and reconstruction and excision of Haglund deformity;  Surgeon: Wylene Simmer, MD;  Location: [place];  Service: Orthopedics;  Laterality: Left;   EYE SURGERY Left 08/2016   GASTROCNEMIUS RECESSION Left 07/02/2019   Procedure: Gastroc recession;  Surgeon: Wylene Simmer, MD;  Location: [place];  Service: Orthopedics;  Laterality: Left;   IR KYPHO THORACIC WITH BONE BIOPSY  12/18/2018   IR RADIOLOGIST EVAL & MGMT  12/09/2018   IR RADIOLOGIST EVAL & MGMT  04/30/2019   KNEE CLOSED REDUCTION Left 02/03/2014   Procedure: CLOSED MANIPULATION LEFT KNEE;  Surgeon: Gearlean Alf, MD;  Location: [facility];  Service: Orthopedics;  Laterality: Left;  POLYPECTOMY     REVERSE SHOULDER ARTHROPLASTY     Left   REVERSE SHOULDER ARTHROPLASTY Left 06/28/2017   Procedure: LEFT SHOULDER REVERSE SHOULDER ARTHROPLASTY;  Surgeon: Netta Cedars, MD;  Location: [place];  Service: Orthopedics;  Laterality: Left;   REVERSE SHOULDER ARTHROPLASTY Right 11/03/2021   Procedure: REVERSE SHOULDER ARTHROPLASTY;  Surgeon: Netta Cedars, MD;  Location: [facility];  Service: Orthopedics;  Laterality: Right;  with ISB   TOTAL KNEE ARTHROPLASTY Left 12/21/2013   Procedure: LEFT TOTAL KNEE ARTHROPLASTY;  Surgeon: Gearlean Alf, MD;  Location: [facility];  Service: Orthopedics;  Laterality: Left;   VARICOSE  VEIN SURGERY      Social History   Socioeconomic History   Marital status: Married    Spouse name: Runner, broadcasting/film/video   Number of children: Not on file   Years of education: Not on file   Highest education level: Associate degree: occupational, Hotel manager, or vocational program  Occupational History   Occupation: retired  Tobacco Use   Smoking status: Never   Smokeless tobacco: Never  Vaping Use   Vaping Use: Never used  Substance and Sexual Activity   Alcohol use: No    Alcohol/week: 0.0 standard drinks of alcohol   Drug use: No   Sexual activity: Not on file  Other Topics Concern   Not on file  Social History Narrative   Not on file   Social Determinants of Health   Financial Resource Strain: Low Risk  (12/21/2021)   Overall Financial Resource Strain (CARDIA)    Difficulty of Paying Living Expenses: Not hard at all  Food Insecurity: No [place] (09/23/2022)   Hunger Vital Sign    Worried About Running Out of Food in the Last Year: Never true    [place] in the Last Year: Never true  Transportation Needs: No Transportation Needs (09/23/2022)   PRAPARE - Hydrologist (Medical): No    Lack of Transportation (Non-Medical): No  Physical Activity: Unknown (09/23/2022)   Exercise Vital Sign    Days of Exercise per Week: 0 days    Minutes of Exercise per Session: Not on file  Recent Concern: Physical Activity - Inactive (09/23/2022)   Exercise Vital Sign    Days of Exercise per Week: 0 days    Minutes of Exercise per Session: 0 min  Stress: Stress Concern Present (09/23/2022)   [place]    Feeling of Stress : Rather much  Social Connections: Socially Integrated (09/23/2022)   Social Connection and Isolation Panel [NHANES]    Frequency of Communication with Friends and Family: More than three times a week    Frequency of Social Gatherings with Friends and Family: More than three  times a week    Attends Religious Services: More than 4 times per year    Active Member of [employer] or Organizations: Yes    Attends Archivist Meetings: More than 4 times per year    Marital Status: Married  Human resources officer Violence: Not At Risk (12/21/2021)   Humiliation, Afraid, Rape, and Kick questionnaire    Fear of Current or Ex-Partner: No    Emotionally Abused: No    Physically Abused: No    Sexually Abused: No    Family History  Problem Relation Age of Onset   Colon polyps Mother    Diabetes Mother    High blood pressure Mother    High Cholesterol Mother  Thyroid disease Mother    Anxiety disorder Mother    Colon cancer Maternal Aunt    Liver cancer Maternal Aunt    Breast cancer Other    Coronary artery disease Other    Colon cancer Other    Diabetes Other    Hypertension Other    Kidney disease Other    Lung cancer Other    Lung cancer Brother        spleen, bone   Diabetes Father    High blood pressure Father    High Cholesterol Father    Heart disease Father    Depression Father    Obesity Father    Esophageal cancer Neg Hx    Stomach cancer Neg Hx    Rectal cancer Neg Hx     Current Outpatient Medications on File Prior to Visit  Medication Sig Dispense Refill   acetaminophen (TYLENOL) 500 MG tablet Take 1,000 mg by mouth every 6 (six) hours as needed for moderate pain.     albuterol (VENTOLIN HFA) 108 (90 Base) MCG/ACT inhaler Inhale 2 puffs into the lungs every 4 (four) hours as needed for wheezing or shortness of breath. 18 g 2   aspirin (RA ASPIRIN CHILDRENS) 81 MG chewable tablet Chew 81 mg by mouth once.     azithromycin (ZITHROMAX Z-PAK) 250 MG tablet As directed 6 each 0   Calcium Carb-Cholecalciferol 600-800 MG-UNIT TABS Take 2 tablets by mouth daily.     cetirizine (ZYRTEC) 10 MG tablet Take 10 mg by mouth daily as needed for allergies.     cetirizine (ZYRTEC) 10 MG tablet Take 10 mg by mouth daily. PRN     Cholecalciferol (VITAMIN D)  50 MCG (2000 UT) CAPS Take 1 capsule (2,000 Units total) by mouth daily. 30 capsule 0   cholecalciferol 25 MCG (1000 UT) tablet Take 1,000 Units by mouth daily.     ezetimibe (ZETIA) 10 MG tablet Take 1 tablet (10 mg total) by mouth daily. 90 tablet 3   metFORMIN (GLUCOPHAGE) 500 MG tablet Take 1 tablet (500 mg total) by mouth daily with breakfast. 90 tablet 0   metoprolol succinate (TOPROL-XL) 25 MG 24 hr tablet Take 1 tablet (25 mg total) by mouth 2 (two) times daily. 180 tablet 3   metoprolol tartrate (LOPRESSOR) 25 MG tablet Take 12.5 mg by mouth 2 (two) times daily. PT NOT QUITE SURE OF THIS MED     Multiple Vitamins-Minerals (MULTIVITAL PO) Take 1 tablet by mouth daily.     MYRBETRIQ 50 MG TB24 tablet Take 50 mg by mouth daily.     omeprazole (PRILOSEC) 40 MG capsule TAKE 1 CAPSULE BY MOUTH DAILY 90 capsule 1   Polyethyl Glycol-Propyl Glycol (SYSTANE ULTRA) 0.4-0.3 % SOLN Apply to eye. PRN     ramipril (ALTACE) 10 MG capsule Take 1 capsule (10 mg total) by mouth daily. 90 capsule 3   rosuvastatin (CRESTOR) 40 MG tablet Take 1 tablet (40 mg total) by mouth daily. 90 tablet 3   temazepam (RESTORIL) 15 MG capsule TAKE 1 CAPSULE BY MOUTH EVERY  NIGHT AT BEDTIME AS NEEDED FOR  SLEEP 90 capsule 1   No current facility-administered medications on file prior to visit.    Allergies  Allergen Reactions   Codeine Shortness Of Breath, Rash and Other (See Comments)    Flushing, tolerates hydrocodone    Levofloxacin In D5w Other (See Comments)    Leg pain     Amoxicillin-Pot Clavulanate Rash     Has  patient had a PCN reaction causing immediate rash, facial/tongue/throat swelling, SOB or lightheadedness with hypotension: No Has patient had a PCN reaction causing severe rash involving mucus membranes or skin necrosis: No Has patient had a PCN reaction that required hospitalization: No Has patient had a PCN reaction occurring within the last 10 years: Yes If all of the above answers are "NO",  then may proceed with Cephalosporin use.   Escitalopram Oxalate Rash       Physical Exam There were no vitals filed for this visit. Estimated body mass index is 32.95 kg/m as calculated from the following:   Height as of 12/19/22: 5' 4.5" (1.638 m).   Weight as of 12/19/22: 195 lb (88.5 kg).  EKG (optional): deferred due to virtual visit  GENERAL: alert, oriented, no acute distress detected, full vision exam deferred due to pandemic and/or virtual encounter PSYCH/NEURO: pleasant and cooperative, no obvious depression or anxiety, speech and thought processing grossly intact, Cognitive function grossly intact  Flowsheet Row Video Visit from 12/27/2022 in [place] [facility] at [place]  PHQ-9 Total Score 9           12/27/2022   10:24 AM 09/25/2022    1:59 PM 06/08/2022    1:57 PM 04/27/2022   10:27 AM 04/27/2022    9:23 AM  Depression screen PHQ 2/9  Decreased Interest 0 0 0 0 0  Down, Depressed, Hopeless 3 0 0 0 0  PHQ - 2 Score 3 0 0 0 0  Altered sleeping 0 0 0 1 1  Tired, decreased energy 3 0 '1 2 2  '$ Change in appetite 3 0 0 0 0  Feeling bad or failure about yourself  0 0 0 0 0  Trouble concentrating 0 0 0 0 0  Moving slowly or fidgety/restless 0 0 0 0 0  Suicidal thoughts 0 0 0 0 0  PHQ-9 Score 9 0 '1 3 3  '$ Difficult doing work/chores Somewhat difficult Not difficult at all Not difficult at all Not difficult at all Not difficult at all  She has had a lot of stress lately as her mother had 2 strokes recently and has needed to move to multiple nursing homes - she is the only daughter so she has had to handle everything.      04/27/2022   10:25 AM 06/08/2022    1:58 PM 09/23/2022    2:07 PM 09/25/2022    2:00 PM 12/27/2022   10:22 AM  Fall Risk  Falls in the past year? '1 1 1 1 1  '$ Number of falls in past year - Comments     right fractured shoulder March 31st  Was there an injury with Fall? '1 1 1 1 1  '$ Was there an injury with Fall? - Comments Right shoulder fracture    Bruised   Fall Risk Category Calculator '2 2 3 3 2  '$ Fall Risk Category (Retired) Moderate Moderate High High   (RETIRED) Patient Fall Risk Level Low fall risk Low fall risk  Low fall risk   Patient at Risk for Falls Due to No Fall Risks No Fall Risks  No Fall Risks   Fall risk Follow up Falls evaluation completed;Follow up appointment Falls evaluation completed  Falls evaluation completed   She is seeing orthopedics for her back next week. Feels balance is a little off - plans to do physical therapy.    SUMMARY AND PLAN:  Encounter for Medicare annual wellness exam    Discussed applicable health maintenance/preventive  health measures and advised and referred or ordered per patient preferences:  Health Maintenance  Topic Date Due   Pneumonia Vaccine 5+ Years old (3 of 3 - PPSV23 or PCV20) 10/22/2020, discussed, report she will get at next visit   COVID-19 Vaccine (8 - 2023-24 season) 07/15/2023 (Originally 09/08/2022)   MAMMOGRAM  02/11/2023 - had staff obtain last mamm report to send to PCP as was not in system, was done 04/12/22. Let patient know.   Medicare Annual Wellness (AWV)  12/27/2023   DTaP/Tdap/Td (2 - Td or Tdap) 04/05/2025   COLONOSCOPY (Pts 45-25yr Insurance coverage will need to be confirmed)  08/14/2028   INFLUENZA VACCINE  Completed   DEXA SCAN  Completed - due this summer   Hepatitis C Screening  Completed   Zoster Vaccines- Shingrix  Completed   HPV VACCINES  Aged OSafeco Corporationand counseling on the following was provided based on the above review of health and a plan/checklist for the patient, along with additional information discussed, was provided for the patient in the patient instructions :   -Provided counseling and plan for increased risk of falling if applicable per above screening. (Referral for PT, community based exercise programs, etc.) She is seeing her orthopedic specialist this week and reports plans to do PT with them. Provided safe balance  exercises she can do at home and advised to do 3 days per week - discussed how to do safely.  -offered referral for counseling for the stress - provided number and she agrees to call if needed -Advised and counseled on maintaining healthy weight and healthy lifestyle - including the importance of a healthy diet, regular physical activity, social connections and stress management. -Advised and counseled on a whole foods based healthy diet and regular exercise: discussed a heart healthy whole foods based diet at length. A summary of a healthy diet was provided in the Patient Instructions. She has been on the run a lot so would grab a sand which or hamburger - discussed healthier fast food options and healthy snacks she could take on the run.  - Recommended regular exercise and discussed exercise guidelines, ways to safely increase exercises on limited time, options within the community. She has silver sneakers card and plans to get back into SS program now that thinks may settle down with caring for mother.  -Advise yearly dental visits at minimum and regular eye exams   Follow up: see patient instructions     Patient Instructions  I really enjoyed getting to talk with you today! I am available on Tuesdays and Thursdays for virtual visits if you have any questions or concerns, or if I can be of any further assistance.   CHECKLIST FROM ANNUAL WELLNESS VISIT:  -Follow up (please call to schedule if not scheduled after visit):   -yearly for annual wellness visit with primary care office  Here is a list of your preventive care/health maintenance measures and the plan for each if any are due:  Health Maintenance  Topic Date Due   Pneumonia Vaccine 72 Years old (3 of 3 - PPSV23 or PCV20) 10/22/2020   COVID-19 Vaccine (8 - 2023-24 season) 07/15/2023 (Originally 09/08/2022)   MAMMOGRAM  04/13/2023   Medicare Annual Wellness (AWV)  12/27/2023   DTaP/Tdap/Td (2 - Td or Tdap) 04/05/2025    COLONOSCOPY (Pts 45-450yrInsurance coverage will need to be confirmed)  08/14/2028   INFLUENZA VACCINE  Completed   DEXA SCAN  Due in April of 2022  Hepatitis C Screening  Completed   Zoster Vaccines- Shingrix  Completed   HPV VACCINES  Aged Out    -See a dentist at least yearly  -Get your eyes checked and then per your eye specialist's recommendations  -Other issues addressed today:   -I have included below further information regarding a healthy whole foods based diet, physical activity guidelines for adults, stress management and opportunities for social connections. I hope you find this information useful.   -----------------------------------------------------------------------------------------------------------------------------------------------------------------------------------------------------------------------------------------------------------  NUTRITION: -eat real food: lots of colorful vegetables (half the plate) and fruits -5-7 servings of vegetables and fruits per day (fresh or steamed is best), exp. 2 servings of vegetables with lunch and dinner and 2 servings of fruit per day. Berries and greens such as kale and collards are great choices.  -consume on a regular basis: whole grains (make sure first ingredient on label contains the word "whole"), fresh fruits, fish, nuts, seeds, healthy oils (such as olive oil, avocado oil, grape seed oil) -may eat small amounts of dairy and lean meat on occasion, but avoid processed meats such as ham, bacon, lunch meat, etc. -drink water -try to avoid fast food and pre-packaged foods, processed meat -most experts advise limiting sodium to < '2300mg'$  per day, should limit further is any chronic conditions such as high blood pressure, heart disease, diabetes, etc. The American Heart Association advised that < '1500mg'$  is is ideal -try to avoid foods that contain any ingredients with names you do not recognize  -try to avoid sugar/sweets  (except for the natural sugar that occurs in fresh fruit) -try to avoid sweet drinks -try to avoid white rice, white bread, pasta (unless whole grain), white or yellow potatoes  EXERCISE GUIDELINES FOR ADULTS: -if you wish to increase your physical activity, do so gradually and with the approval of your doctor -STOP and seek medical care immediately if you have any chest pain, chest discomfort or trouble breathing when starting or increasing exercise  -move and stretch your body, legs, feet and arms when sitting for long periods -Physical activity guidelines for optimal health in adults: -least 150 minutes per week of aerobic exercise (can talk, but not sing) once approved by your doctor, 20-30 minutes of sustained activity or two 10 minute episodes of sustained activity every day.  -resistance training at least 2 days per week if approved by your doctor -balance exercises 3+ days per week:   Stand somewhere where you have something sturdy to hold onto if you lose balance.    1) lift up on toes, start with 5x per day and work up to 20x   2) stand and lift on leg straight out to the side so that foot is a few inches of the floor, start with 5x each side and work up to 20x each side   3) stand on one foot, start with 5 seconds each side and work up to 20 seconds on each side  If you need ideas or help with getting more active:  -Silver sneakers https://tools.silversneakers.com  -Walk with a Doc: http://stephens-thompson.biz/  -try to include resistance (weight lifting/strength building) and balance exercises twice per week: or the following link for ideas: ChessContest.fr  UpdateClothing.com.cy  STRESS MANAGEMENT: -can try meditating, or just sitting quietly with deep breathing while intentionally relaxing all parts of your body for 5 minutes daily -if you need further help with stress, anxiety or  depression please follow up with your primary doctor or contact the wonderful folks at [facility]: [phone]  SOCIAL CONNECTIONS: -options in [place] if you wish to engage in more social and exercise related activities:  -Silver sneakers https://tools.silversneakers.com  -Walk with a Doc: http://stephens-thompson.biz/  -Check out the [place] 50+ section on the [place] [employer] (hiking clubs, book clubs, cards and games, chess, exercise classes, aquatic classes and much more) - see the website for details: https://www.[place].gov/departments/parks-recreation/active-adults50  -YouTube has lots of exercise videos for different ages and abilities as well  -[place] (a variety of indoor and outdoor inperson activities for adults). (567) 798-5988. [address]  -Virtual Online Classes (a variety of topics): see seniorplanet.org or call [phone]  -consider volunteering at a school, hospice center, church, senior center or elsewhere           Lucretia Kern, DO

## 2022-12-31 ENCOUNTER — Other Ambulatory Visit: Payer: Self-pay | Admitting: Specialist

## 2022-12-31 DIAGNOSIS — M5459 Other low back pain: Secondary | ICD-10-CM

## 2023-01-01 ENCOUNTER — Other Ambulatory Visit: Payer: Self-pay | Admitting: Specialist

## 2023-01-01 DIAGNOSIS — M5459 Other low back pain: Secondary | ICD-10-CM

## 2023-01-02 ENCOUNTER — Ambulatory Visit (INDEPENDENT_AMBULATORY_CARE_PROVIDER_SITE_OTHER): Payer: Medicare Other | Admitting: Bariatrics

## 2023-01-02 ENCOUNTER — Encounter: Payer: Self-pay | Admitting: Bariatrics

## 2023-01-02 VITALS — BP 144/77 | HR 68 | Temp 97.7°F | Ht 64.0 in | Wt 192.0 lb

## 2023-01-02 DIAGNOSIS — E88819 Insulin resistance, unspecified: Secondary | ICD-10-CM

## 2023-01-02 DIAGNOSIS — I10 Essential (primary) hypertension: Secondary | ICD-10-CM | POA: Diagnosis not present

## 2023-01-02 DIAGNOSIS — Z6832 Body mass index (BMI) 32.0-32.9, adult: Secondary | ICD-10-CM | POA: Diagnosis not present

## 2023-01-02 DIAGNOSIS — R7303 Prediabetes: Secondary | ICD-10-CM

## 2023-01-02 MED ORDER — METFORMIN HCL 500 MG PO TABS: 500.0000 mg | ORAL_TABLET | Freq: Every day | ORAL | 0 refills | Status: DC

## 2023-01-02 NOTE — Progress Notes (Signed)
WEIGHT SUMMARY AND BIOMETRICS  Weight Lost Since Last Visit: 0   Vitals Temp: 97.7 F (36.5 C) BP: (!) 144/77 Pulse Rate: 68 SpO2: 99 %   Anthropometric Measurements Height: '5\' 4"'$  (1.626 m) Weight: 192 lb (87.1 kg) BMI (Calculated): 32.94 Weight at Last Visit: 189lb Weight Lost Since Last Visit: 0 Starting Weight: 213lb Total Weight Loss (lbs): 21 lb (9.526 kg)   Body Composition  Body Fat %: 49.4 % Fat Mass (lbs): 94.8 lbs Muscle Mass (lbs): 92.2 lbs Total Body Water (lbs): 69 lbs Visceral Fat Rating : 15   Other Clinical Data Fasting: no Labs: no Today's Visit #: 64 Starting Date: 08/04/18    OBESITY Natalie Hall is here to discuss her progress with her obesity treatment plan along with follow-up of her obesity related diagnoses.     Nutrition Plan: the Category 2 plan and keeping a food journal with goal of 1200 calories and 80 grams of protein daily - 50% adherence.  Current exercise: none  Interim History:  She is up 3 lbs since her last visit.  Protein intake is as prescribed, Is not skipping meals, and Water intake is inadequate.  Pharmacotherapy: Natalie Hall is on Metformin 500 mg once daily with meals Adverse side effects: None Hunger is moderately controlled.  Cravings are moderately controlled.  Assessment/Plan:  1. Insulin Resistance Natalie Hall has had elevated fasting insulin readings. Goal is HgbA1c < 5.7, fasting insulin at l0 or less, and preferably at 5.  She  denies polyphagia. Medication(s): Metformin  Lab Results  Component Value Date   HGBA1C 6.0 04/27/2022   Lab Results  Component Value Date   INSULIN 18.6 07/23/2022   INSULIN 16.6 12/06/2020   INSULIN 18.5 11/17/2019   INSULIN 15.4 11/20/2018   INSULIN 33.5 (H) 08/04/2018    Plan Medication(s): Metformin 500 mg once daily with meals Will work on the agreed upon plan. Will minimize refined carbohydrates ( sweets and starches), and focus more on complex carbohydrates.   Increase the micronutrients found in leafy greens, which include magnesium, polyphenols, and vitamin C which have been postulated to help with insulin sensitivity. Minimize "fast food" and cook more meals at home.  Increase fiber to 25 to 30 grams daily.  Information sheet on " Insulin Resistance and Prediabetes".       2. Hypertension Hypertension stable.  Medication(s): Toprol, Lopressor, and Altace.   BP Readings from Last 3 Encounters:  01/02/23 (!) 144/77  12/19/22 108/62  11/21/22 120/70   Lab Results  Component Value Date   CREATININE 0.73 07/23/2022   CREATININE 0.64 04/27/2022   CREATININE 0.53 11/04/2021   Lab Results  Component Value Date   GFR 88.57 04/27/2022   GFR 89.56 04/13/2021   GFR 80.10 03/31/2020    Plan: 1.Continue all antihypertensives at current dosages. 2. No added salt.       Morbid Obesity: Current BMI 32 Pharmacotherapy Plan Continue and refill  Metformin 500 mg once daily with meals  Natalie Hall is currently in the action stage of change. As such, her goal is to continue with weight loss efforts.  She has agreed to the Category 2 plan.  Exercise goals: Older adults should determine their level of effort for physical activity relative to their level of fitness.   Behavioral modification strategies: increase water intake, planning for success, and keep healthy foods in the home.  Natalie Hall has agreed to follow-up with our clinic in 4 weeks.   No orders of the defined types were placed in  this encounter.   Medications Discontinued During This Encounter  Medication Reason   azithromycin (ZITHROMAX Z-PAK) 250 MG tablet Patient Preference   cetirizine (ZYRTEC) 10 MG tablet Patient Preference   cholecalciferol 25 MCG (1000 UT) tablet Patient Preference   metFORMIN (GLUCOPHAGE) 500 MG tablet Reorder     Meds ordered this encounter  Medications   metFORMIN (GLUCOPHAGE) 500 MG tablet    Sig: Take 1 tablet (500 mg total) by mouth daily with  breakfast.    Dispense:  90 tablet    Refill:  0      Objective:   VITALS: Per patient if applicable, see vitals. GENERAL: Alert and in no acute distress. CARDIOPULMONARY: No increased WOB. Speaking in clear sentences.  PSYCH: Pleasant and cooperative. Speech normal rate and rhythm. Affect is appropriate. Insight and judgement are appropriate. Attention is focused, linear, and appropriate.  NEURO: Oriented as arrived to appointment on time with no prompting.   Attestation Statements:   This was prepared with the assistance of Presenter, broadcasting.  Occasional wrong-word or sound-a-like substitutions may have occurred due to the inherent limitations of voice recognition software.   Natalie Lesch, DO

## 2023-01-12 ENCOUNTER — Other Ambulatory Visit: Payer: Self-pay | Admitting: Family Medicine

## 2023-01-18 ENCOUNTER — Ambulatory Visit
Admission: RE | Admit: 2023-01-18 | Discharge: 2023-01-18 | Disposition: A | Discharge status: Home / Self Care | Payer: Medicare Other | Source: Ambulatory Visit | Attending: Specialist | Admitting: Specialist

## 2023-01-18 DIAGNOSIS — M5459 Other low back pain: Secondary | ICD-10-CM

## 2023-01-21 ENCOUNTER — Other Ambulatory Visit: Payer: Self-pay | Admitting: Specialist

## 2023-01-21 DIAGNOSIS — M5459 Other low back pain: Secondary | ICD-10-CM

## 2023-01-29 ENCOUNTER — Other Ambulatory Visit: Payer: Medicare Other

## 2023-01-30 ENCOUNTER — Ambulatory Visit: Payer: Medicare Other | Admitting: Bariatrics

## 2023-01-31 ENCOUNTER — Other Ambulatory Visit: Payer: Medicare Other

## 2023-02-05 ENCOUNTER — Ambulatory Visit: Payer: Medicare Other | Admitting: Bariatrics

## 2023-02-19 ENCOUNTER — Other Ambulatory Visit: Payer: Self-pay | Admitting: Family Medicine

## 2023-02-28 ENCOUNTER — Ambulatory Visit (INDEPENDENT_AMBULATORY_CARE_PROVIDER_SITE_OTHER): Payer: Medicare Other | Admitting: Bariatrics

## 2023-02-28 ENCOUNTER — Encounter: Payer: Self-pay | Admitting: Bariatrics

## 2023-02-28 DIAGNOSIS — R7303 Prediabetes: Secondary | ICD-10-CM | POA: Diagnosis not present

## 2023-02-28 DIAGNOSIS — Z6832 Body mass index (BMI) 32.0-32.9, adult: Secondary | ICD-10-CM

## 2023-02-28 NOTE — Progress Notes (Signed)
WEIGHT SUMMARY AND BIOMETRICS  Weight Lost Since Last Visit: 0 lb  Weight Gained Since Last Visit: 0 lb   Vitals Temp: 97.9 F (36.6 C) BP: 139/77 Pulse Rate: 64 SpO2: 98 %   Anthropometric Measurements Height: 5\' 4"  (1.626 m) Weight: 192 lb (87.1 kg) BMI (Calculated): 32.94 Weight at Last Visit: 192 lb Weight Lost Since Last Visit: 0 lb Weight Gained Since Last Visit: 0 lb Starting Weight: 213 lb   Body Composition  Body Fat %: 49 % Fat Mass (lbs): 94.2 lbs Muscle Mass (lbs): 93.2 lbs Total Body Water (lbs): 67.6 lbs Visceral Fat Rating : 14   Other Clinical Data Fasting: no Labs: no Today's Visit #: 60 Starting Date: 08/04/18    OBESITY Janeisha is here to discuss her progress with her obesity treatment plan along with follow-up of her obesity related diagnoses.     Nutrition Plan: the Category 2 plan - 70% adherence.  Current exercise:  PT/75/2 times a week.  Interim History:  Her weight remains the same as her last visit.  Protein intake is less than prescribed. and Water intake is adequate.  Pharmacotherapy: Lynette is on Metformin 500 mg once daily breakfast Adverse side effects: None Hunger is moderately controlled.  Cravings are moderately controlled.  Assessment/Plan:    Prediabetes Last A1c was 6.0  Medication(s): Metformin  Lab Results  Component Value Date   HGBA1C 6.0 04/27/2022   HGBA1C 6.1 (H) 10/20/2021   HGBA1C 5.9 04/13/2021   HGBA1C 5.5 12/06/2020   HGBA1C 6.0 03/31/2020   Lab Results  Component Value Date   INSULIN 18.6 07/23/2022   INSULIN 16.6 12/06/2020   INSULIN 18.5 11/17/2019   INSULIN 15.4 11/20/2018   INSULIN 33.5 (H) 08/04/2018    Plan: Will minimize all refined carbohydrates both sweets and starches.  Will work on the plan and exercise.  Consider both aerobic and resistance training.  Will keep protein, water, and fiber intake high.  Increase Polyunsaturated and Monounsaturated fats to  increase satiety and encourage weight loss.  Aim for 7 to 9 hours of sleep nightly.  Will continue medications.   Continue Metformin 500 mg once daily breakfast    Morbid obesity (HCC)   BMI 32.0-32.9,adult    Generalized Obesity: Current BMI BMI (Calculated): 32.94   Pharmacotherapy Plan Continue  Metformin 500 mg once daily breakfast  Charleen is currently in the action stage of change. As such, her goal is to continue with weight loss efforts.  She has agreed to the Category 2 plan.  Exercise goals: Older adults should do exercises that maintain or improve balance if they are at risk of falling.   Behavioral modification strategies: increasing lean protein intake, decreasing simple carbohydrates , no meal skipping, meal planning , and planning for success.  Virgen has agreed to follow-up with our clinic in 4 weeks.   No orders of the defined types were placed in this encounter.   There are no discontinued medications.   No orders of the defined types were placed in this encounter.     Objective:   VITALS: Per patient if applicable, see vitals. GENERAL: Alert and in no acute distress. CARDIOPULMONARY: No increased WOB. Speaking in clear sentences.  PSYCH: Pleasant and cooperative. Speech normal rate and rhythm. Affect is appropriate. Insight and judgement are appropriate. Attention is focused, linear, and appropriate.  NEURO: Oriented as arrived to appointment on time with no prompting.   Attestation Statements:    This was prepared with the  assistance of Engineer, civil (consulting).  Occasional wrong-word or sound-a-like substitutions may have occurred due to the inherent limitations of voice recognition software.    Corinna Capra, DO

## 2023-03-26 ENCOUNTER — Ambulatory Visit (INDEPENDENT_AMBULATORY_CARE_PROVIDER_SITE_OTHER): Payer: Medicare Other | Admitting: Family Medicine

## 2023-03-26 ENCOUNTER — Encounter: Payer: Self-pay | Admitting: Family Medicine

## 2023-03-26 VITALS — BP 118/60 | HR 68 | Temp 98.8°F | Wt 196.0 lb

## 2023-03-26 DIAGNOSIS — N8110 Cystocele, unspecified: Secondary | ICD-10-CM | POA: Diagnosis not present

## 2023-03-26 DIAGNOSIS — M8580 Other specified disorders of bone density and structure, unspecified site: Secondary | ICD-10-CM

## 2023-03-26 NOTE — Progress Notes (Signed)
   Subjective:    Patient ID: Natalie Hall, female    DOB: 1951/02/14, 72 y.o.   MRN: [id number]  HPI Here to check a "lump" in her vagina that she felt for the first time in the shower a few days ago. This is not painful. She urinates normally.    Review of Systems  Constitutional: Negative.   Respiratory: Negative.    Cardiovascular: Negative.   Genitourinary:  Negative for difficulty urinating, dysuria, frequency, pelvic pain, urgency, vaginal bleeding, vaginal discharge and vaginal pain.       Objective:   Physical Exam Constitutional:      Appearance: Normal appearance.  Cardiovascular:     Rate and Rhythm: Normal rate and regular rhythm.     Pulses: Normal pulses.     Heart sounds: Normal heart sounds.  Pulmonary:     Effort: Pulmonary effort is normal.     Breath sounds: Normal breath sounds.  Genitourinary:    General: Normal vulva.     Comments: A cystocele is visible and palpable in the vagina. This is not tender  Neurological:     Mental Status: She is alert.           Assessment & Plan:  Cystocele. We will refer her to her urologist, Dr. McDiarmid, to evaluate.  Gershon Crane, MD

## 2023-03-27 ENCOUNTER — Encounter: Payer: Self-pay | Admitting: Family Medicine

## 2023-03-27 DIAGNOSIS — N8110 Cystocele, unspecified: Secondary | ICD-10-CM

## 2023-03-28 ENCOUNTER — Ambulatory Visit (INDEPENDENT_AMBULATORY_CARE_PROVIDER_SITE_OTHER): Payer: Medicare Other | Admitting: Bariatrics

## 2023-03-28 ENCOUNTER — Encounter: Payer: Self-pay | Admitting: Bariatrics

## 2023-03-28 DIAGNOSIS — R7303 Prediabetes: Secondary | ICD-10-CM

## 2023-03-28 DIAGNOSIS — Z6833 Body mass index (BMI) 33.0-33.9, adult: Secondary | ICD-10-CM

## 2023-03-28 DIAGNOSIS — E669 Obesity, unspecified: Secondary | ICD-10-CM

## 2023-03-28 DIAGNOSIS — E65 Localized adiposity: Secondary | ICD-10-CM

## 2023-03-28 DIAGNOSIS — I515 Myocardial degeneration: Secondary | ICD-10-CM

## 2023-03-28 MED ORDER — METFORMIN HCL 500 MG PO TABS: 500.0000 mg | ORAL_TABLET | Freq: Every day | ORAL | 0 refills | Status: DC

## 2023-03-29 NOTE — Telephone Encounter (Signed)
I did the referral to Dr. Lanetta Inch

## 2023-04-01 NOTE — Progress Notes (Unsigned)
Chief Complaint:   OBESITY Natalie Hall is here to discuss her progress with her obesity treatment plan along with follow-up of her obesity related diagnoses. Natalie Hall is on the Category 2 Plan and states she is following her eating plan approximately 70% of the time. Natalie Hall states she is doing physical therapy for 60 minutes 2 times per week.  Today's visit was #: 61 Starting weight: 213 lbs Starting date: 08/04/2018 Today's weight: 193 lbs Today's date: 03/28/2023 Total lbs lost to date: 20 Total lbs lost since last in-office visit: 0  Interim History: Patient is up 1 lbs since her last visit. She says that she does well and then she gains. Her goal is to get in the 180's.  Subjective:   1. Prediabetes Patient is taking metformin.   2. Visceral fat Patient's visceral fat rating is 15%.   Assessment/Plan:   1. Prediabetes Patient will continue metformin, and we will refill for 90 days.   - metFORMIN (GLUCOPHAGE) 500 MG tablet; Take 1 tablet (500 mg total) by mouth daily with breakfast.  Dispense: 90 tablet; Refill: 0  2. Visceral fat Patient's goal is 13 or below.   3. Generalized obesity  4. Obesity (BMI 30.0-34.9) Natalie Hall is currently in the action stage of change. As such, her goal is to continue with weight loss efforts. She has agreed to the Category 2 Plan and keeping a food journal and adhering to recommended goals of 1200 calories and 80 grams of protein.   Meal planning and intentional eating were discussed. She will get back on track.   Exercise goals: As is.   Behavioral modification strategies: increasing lean protein intake, decreasing simple carbohydrates, increasing vegetables, increasing water intake, decreasing eating out, no skipping meals, meal planning and cooking strategies, keeping healthy foods in the home, and planning for success.  Natalie Hall has agreed to follow-up with our clinic in 4 to 6 weeks. She was informed of the importance of frequent follow-up  visits to maximize her success with intensive lifestyle modifications for her multiple health conditions.   Objective:   Blood pressure 127/78, pulse 68, temperature 97.8 F (36.6 C), height 5\' 4"  (1.626 m), weight 193 lb (87.5 kg), SpO2 98 %. Body mass index is 33.13 kg/m.  General: Cooperative, alert, well developed, in no acute distress. HEENT: Conjunctivae and lids unremarkable. Cardiovascular: Regular rhythm.  Lungs: Normal work of breathing. Neurologic: No focal deficits.   Lab Results  Component Value Date   CREATININE 0.73 07/23/2022   BUN 18 07/23/2022   NA 135 07/23/2022   K 4.6 07/23/2022   CL 99 07/23/2022   CO2 22 07/23/2022   Lab Results  Component Value Date   ALT 24 07/23/2022   AST 30 07/23/2022   ALKPHOS 48 07/23/2022   BILITOT 0.3 07/23/2022   Lab Results  Component Value Date   HGBA1C 6.0 04/27/2022   HGBA1C 6.1 (H) 10/20/2021   HGBA1C 5.9 04/13/2021   HGBA1C 5.5 12/06/2020   HGBA1C 6.0 03/31/2020   Lab Results  Component Value Date   INSULIN 18.6 07/23/2022   INSULIN 16.6 12/06/2020   INSULIN 18.5 11/17/2019   INSULIN 15.4 11/20/2018   INSULIN 33.5 (H) 08/04/2018   Lab Results  Component Value Date   TSH 1.73 04/27/2022   Lab Results  Component Value Date   CHOL 102 07/23/2022   HDL 31 (L) 07/23/2022   LDLCALC 50 07/23/2022   LDLDIRECT 96.0 03/11/2018   TRIG 116 07/23/2022   CHOLHDL 3  04/27/2022   Lab Results  Component Value Date   VD25OH 74.5 07/23/2022   VD25OH 77.36 04/27/2022   VD25OH 51.1 12/06/2020   Lab Results  Component Value Date   WBC 4.1 04/27/2022   HGB 12.2 04/27/2022   HCT 37.2 04/27/2022   MCV 85.5 04/27/2022   PLT 164.0 04/27/2022   Lab Results  Component Value Date   FERRITIN 33 01/17/2022   Attestation Statements:   Reviewed by clinician on day of visit: allergies, medications, problem list, medical history, surgical history, family history, social history, and previous encounter notes.   Trude Mcburney, am acting as Energy manager for [employer], DO.  I have reviewed the above documentation for accuracy and completeness, and I agree with the above. Corinna Capra, DO

## 2023-04-08 ENCOUNTER — Other Ambulatory Visit: Payer: Self-pay | Admitting: Family Medicine

## 2023-04-21 NOTE — Progress Notes (Unsigned)
[facility]gynecology New Patient Evaluation and Consultation  Referring Provider: Nelwyn Salisbury, MD PCP: Nelwyn Salisbury, MD Date of Service: 04/22/2023  SUBJECTIVE Chief Complaint: No chief complaint on file.  History of Present Illness: Natalie Hall is a 72 y.o. White or Caucasian female seen in consultation at the request of Dr. Clent Ridges for evaluation of Prolapse.    ***Review of records significant for: ***  Urinary Symptoms: {urine leakage?:24754} Leaks *** time(s) per {days/wks/mos/yrs:310907}.  Pad use: {NUMBERS 1-10:18281} {pad option:24752} per day.   She {ACTION; IS/IS ZOX:09604540} bothered by her UI symptoms.  Day time voids ***.  Nocturia: *** times per night to void. Voiding dysfunction: she {empties:24755} her bladder well.  {DOES NOT does:27190::"does not"} use a catheter to empty bladder.  When urinating, she feels {urine symptoms:24756} Drinks: *** per day  UTIs: {NUMBERS 1-10:18281} UTI's in the last year.   {ACTIONS;DENIES/REPORTS:21021675::"Denies"} history of {urologic concerns:24757}  Pelvic Organ Prolapse Symptoms:                  She {denies/ admits to:24761} a feeling of a bulge the vaginal area. It has been present for {NUMBER 1-10:22536} {days/wks/mos/yrs:310907}.  She {denies/ admits to:24761} seeing a bulge.  This bulge {ACTION; IS/IS JWJ:19147829} bothersome.  Bowel Symptom: Bowel movements: *** time(s) per {Time; day/week/month:13537} Stool consistency: {stool consistency:24758} Straining: {yes/no:19897}.  Splinting: {yes/no:19897}.  Incomplete evacuation: {yes/no:19897}.  She {denies/ admits to:24761} accidental bowel leakage / fecal incontinence  Occurs: *** time(s) per {Time; day/week/month:13537}  Consistency with leakage: {stool consistency:24758} Bowel regimen: {bowel regimen:24759} Last colonoscopy: Date ***, Results ***  Sexual Function Sexually active: {yes/no:19897}.  Sexual orientation: {Sexual Orientation:[phone]} Pain  with sex: {pain with sex:24762}  Pelvic Pain {denies/ admits to:24761} pelvic pain Location: *** Pain occurs: *** Prior pain treatment: *** Improved by: *** Worsened by: ***   Past Medical History:  Past Medical History:  Diagnosis Date   Asthma    infrequent problem - no treatment since 2006   Back pain    Choroidal nevus of left eye    sees Dr. Marvis Repress at [facility]    Constipation    Coronary artery disease    Dysrhythmia    SVT - ablation by Dr. Elberta Fortis on 05/21/2016 and episodes since   Fatty liver    GERD (gastroesophageal reflux disease)    History of hiatal hernia    "very small"   Hx of colonic polyp    Hyperlipidemia    Hypertension    Joint pain    Melanoma of eye, left (HCC)    sees Dr. Gwendalyn Ege   Myocardial infarction [facility])    Osteoarthritis    Osteoporosis    osteopenia   Shortness of breath    Supraventricular tachycardia      Past Surgical History:   Past Surgical History:  Procedure Laterality Date   ABDOMINAL HYSTERECTOMY     ACHILLES TENDON REPAIR  10/07/2011   torn on right, per Dr. Eugenio Hoes SURGERY     torn tendon left ankle   BREAST BIOPSY     CARDIAC CATHETERIZATION N/A 03/14/2016   Procedure: Left Heart Cath and Coronary Angiography;  Surgeon: Peter M Swaziland, MD;  Location: [facility] INVASIVE CV LAB;  Service: Cardiovascular;  Laterality: N/A;   CARDIAC CATHETERIZATION N/A 03/14/2016   Procedure: Coronary Stent Intervention;  Surgeon: Peter M Swaziland, MD;  Location: [facility] INVASIVE CV LAB;  Service: Cardiovascular;  Laterality: N/A;   CARPAL TUNNEL RELEASE  CATARACT EXTRACTION W/ INTRAOCULAR LENS IMPLANT Bilateral    CHOLECYSTECTOMY     COLONOSCOPY  08/14/2021   per Dr. Christella Hartigan, adenomatous polyps, repeat in 7 yrs     ELECTROPHYSIOLOGIC STUDY N/A 05/21/2016   Procedure: SVT Ablation;  Surgeon: Will Jorja Loa, MD;  Location: [facility] INVASIVE CV LAB;  Service: Cardiovascular;  Laterality: N/A;   EXCISION HAGLUND'S DEFORMITY  WITH ACHILLES TENDON REPAIR Left 07/02/2019   Procedure: Left Achilles tendon debridement and reconstruction and excision of Haglund deformity;  Surgeon: Toni Arthurs, MD;  Location: [facility];  Service: Orthopedics;  Laterality: Left;   EYE SURGERY Left 08/2016   GASTROCNEMIUS RECESSION Left 07/02/2019   Procedure: Gastroc recession;  Surgeon: Toni Arthurs, MD;  Location: [facility];  Service: Orthopedics;  Laterality: Left;   IR KYPHO THORACIC WITH BONE BIOPSY  12/18/2018   IR RADIOLOGIST EVAL & MGMT  12/09/2018   IR RADIOLOGIST EVAL & MGMT  04/30/2019   KNEE CLOSED REDUCTION Left 02/03/2014   Procedure: CLOSED MANIPULATION LEFT KNEE;  Surgeon: Loanne Drilling, MD;  Location: [facility];  Service: Orthopedics;  Laterality: Left;   POLYPECTOMY     REVERSE SHOULDER ARTHROPLASTY     Left   REVERSE SHOULDER ARTHROPLASTY Left 06/28/2017   Procedure: LEFT SHOULDER REVERSE SHOULDER ARTHROPLASTY;  Surgeon: Beverely Low, MD;  Location: [facility] OR;  Service: Orthopedics;  Laterality: Left;   REVERSE SHOULDER ARTHROPLASTY Right 11/03/2021   Procedure: REVERSE SHOULDER ARTHROPLASTY;  Surgeon: Beverely Low, MD;  Location: [facility];  Service: Orthopedics;  Laterality: Right;  with ISB   TOTAL KNEE ARTHROPLASTY Left 12/21/2013   Procedure: LEFT TOTAL KNEE ARTHROPLASTY;  Surgeon: Loanne Drilling, MD;  Location: [facility];  Service: Orthopedics;  Laterality: Left;   VARICOSE VEIN SURGERY       Past OB/GYN History: G{NUMBERS 1-10:18281} P{NUMBERS 1-10:18281} Vaginal deliveries: ***,  Forceps/ Vacuum deliveries: ***, Cesarean section: *** Menopausal: {menopausal:24763} Contraception: ***. Last pap smear was ***.  Any history of abnormal pap smears: {yes/no:19897}.   Medications: She has a current medication list which includes the following prescription(s): acetaminophen, albuterol, aspirin, calcium carb-cholecalciferol, cetirizine, vitamin d, ezetimibe, metformin, metoprolol  succinate, metoprolol tartrate, multiple vitamins-minerals, myrbetriq, omeprazole, systane ultra, ramipril, rosuvastatin, and temazepam.   Allergies: Patient is allergic to codeine, levofloxacin in d5w, amoxicillin-pot clavulanate, and escitalopram oxalate.   Social History:  Social History   Tobacco Use   Smoking status: Never   Smokeless tobacco: Never  Vaping Use   Vaping Use: Never used  Substance Use Topics   Alcohol use: No    Alcohol/week: 0.0 standard drinks of alcohol   Drug use: No    Relationship status: {relationship status:24764} She lives with ***.   She {ACTION; IS/IS ZOX:09604540} employed ***. Regular exercise: {Yes/No:304960894} History of abuse: {Yes/No:304960894}  Family History:   Family History  Problem Relation Age of Onset   Colon polyps Mother    Diabetes Mother    High blood pressure Mother    High Cholesterol Mother    Thyroid disease Mother    Anxiety disorder Mother    Colon cancer Maternal Aunt    Liver cancer Maternal Aunt    Breast cancer Other    Coronary artery disease Other    Colon cancer Other    Diabetes Other    Hypertension Other    Kidney disease Other    Lung cancer Other    Lung cancer Brother        spleen, bone  Diabetes Father    High blood pressure Father    High Cholesterol Father    Heart disease Father    Depression Father    Obesity Father    Esophageal cancer Neg Hx    Stomach cancer Neg Hx    Rectal cancer Neg Hx      Review of Systems: ROS   OBJECTIVE Physical Exam: There were no vitals filed for this visit.  Physical Exam   GU / Detailed Urogynecologic Evaluation:  Pelvic Exam: Normal external female genitalia; Bartholin's and Skene's glands normal in appearance; urethral meatus normal in appearance, no urethral masses or discharge.   CST: {gen negative/positive:315881}  Reflexes: bulbocavernosis {DESC; PRESENT/NOT PRESENT:21021351}, anocutaneous {DESC; PRESENT/NOT PRESENT:21021351}  ***bilaterally.  Speculum exam reveals normal vaginal mucosa {With/Without:20273} atrophy. Cervix {exam; gyn cervix:30847}. Uterus {exam; pelvic uterus:30849}. Adnexa {exam; adnexa:12223}.    s/p hysterectomy: Speculum exam reveals normal vaginal mucosa {With/Without:20273}  atrophy and normal vaginal cuff.  Adnexa {exam; adnexa:12223}.    With apex supported, anterior compartment defect was {reduced:24765}  Pelvic floor strength {Roman # I-V:19040}/V, puborectalis {Roman # I-V:19040}/V external anal sphincter {Roman # I-V:19040}/V  Pelvic floor musculature: Right levator {Tender/Non-tender:20250}, Right obturator {Tender/Non-tender:20250}, Left levator {Tender/Non-tender:20250}, Left obturator {Tender/Non-tender:20250}  POP-Q:   POP-Q                                               Aa                                               Ba                                                 C                                                Gh                                               Pb                                               tvl                                                Ap                                               Bp  D      Rectal Exam:  Normal sphincter tone, {rectocele:24766} distal rectocele, enterocoele {DESC; PRESENT/NOT PRESENT:21021351}, no rectal masses, {sign of:24767} dyssynergia when asking the patient to bear down.  Post-Void Residual (PVR) by Bladder Scan: In order to evaluate bladder emptying, we discussed obtaining a postvoid residual and she agreed to this procedure.  Procedure: The ultrasound unit was placed on the patient's abdomen in the suprapubic region after the patient had voided. A PVR of *** ml was obtained by bladder scan.  Laboratory Results: @ENCLABS @   ***I visualized the urine specimen, noting the specimen to be {urine color:24768}  ASSESSMENT AND PLAN Ms. Spargur is a 72 y.o. with:  No diagnosis found.    Selmer Dominion, NP   Medical Decision Making:  - Reviewed/ ordered a clinical laboratory test - Reviewed/ ordered a radiologic study - Reviewed/ ordered medicine test - Decision to obtain old records - Discussion of management of or test interpretation with an external physician / other healthcare professional  - Assessment requiring independent historian - Review and summation of prior records - Independent review of image, tracing or specimen

## 2023-04-22 ENCOUNTER — Encounter: Payer: Self-pay | Admitting: Obstetrics and Gynecology

## 2023-04-22 ENCOUNTER — Ambulatory Visit (INDEPENDENT_AMBULATORY_CARE_PROVIDER_SITE_OTHER): Payer: Medicare Other | Admitting: Obstetrics and Gynecology

## 2023-04-22 ENCOUNTER — Other Ambulatory Visit (HOSPITAL_COMMUNITY)
Admission: RE | Admit: 2023-04-22 | Discharge: 2023-04-22 | Disposition: A | Discharge status: Home / Self Care | Payer: Medicare Other | Source: Other Acute Inpatient Hospital | Attending: Obstetrics and Gynecology | Admitting: Obstetrics and Gynecology

## 2023-04-22 VITALS — BP 135/77 | HR 67 | Ht 63.0 in | Wt 197.0 lb

## 2023-04-22 DIAGNOSIS — N993 Prolapse of vaginal vault after hysterectomy: Secondary | ICD-10-CM | POA: Diagnosis not present

## 2023-04-22 DIAGNOSIS — R319 Hematuria, unspecified: Secondary | ICD-10-CM

## 2023-04-22 DIAGNOSIS — N952 Postmenopausal atrophic vaginitis: Secondary | ICD-10-CM | POA: Diagnosis not present

## 2023-04-22 DIAGNOSIS — N816 Rectocele: Secondary | ICD-10-CM

## 2023-04-22 DIAGNOSIS — N8110 Cystocele, unspecified: Secondary | ICD-10-CM

## 2023-04-22 DIAGNOSIS — N3281 Overactive bladder: Secondary | ICD-10-CM

## 2023-04-22 DIAGNOSIS — R151 Fecal smearing: Secondary | ICD-10-CM

## 2023-04-22 DIAGNOSIS — R350 Frequency of micturition: Secondary | ICD-10-CM

## 2023-04-22 LAB — POCT URINALYSIS DIPSTICK
Bilirubin, UA: NEGATIVE
Glucose, UA: NEGATIVE
Ketones, UA: NEGATIVE
Leukocytes, UA: NEGATIVE
Nitrite, UA: NEGATIVE
Protein, UA: NEGATIVE
Spec Grav, UA: 1.015 (ref 1.010–1.025)
Urobilinogen, UA: 0.2 E.U./dL
pH, UA: 7 (ref 5.0–8.0)

## 2023-04-22 MED ORDER — ESTRADIOL 0.1 MG/GM VA CREA: 0.5000 g | TOPICAL_CREAM | VAGINAL | 11 refills | Status: DC

## 2023-04-22 NOTE — Patient Instructions (Addendum)
Consider adding magnesium to your supplements for your bowels (250mg  nightly) to assist with constipation.    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  Information on surgical options given.   Use estrogen cream every night until pessary fitting. We will do the fitting and have you come back prior to your trip, then do the 3 month follow up with the surgeon to consider surgery options.

## 2023-04-23 LAB — URINE CULTURE: Culture: NO GROWTH

## 2023-04-24 LAB — HM DEXA SCAN

## 2023-04-24 LAB — HM MAMMOGRAPHY

## 2023-04-29 ENCOUNTER — Encounter: Payer: Self-pay | Admitting: Family Medicine

## 2023-04-29 ENCOUNTER — Ambulatory Visit (INDEPENDENT_AMBULATORY_CARE_PROVIDER_SITE_OTHER): Payer: Medicare Other

## 2023-04-29 ENCOUNTER — Ambulatory Visit (INDEPENDENT_AMBULATORY_CARE_PROVIDER_SITE_OTHER): Payer: Medicare Other | Admitting: Family Medicine

## 2023-04-29 VITALS — BP 118/64 | HR 59 | Temp 98.3°F | Ht 63.0 in | Wt 199.0 lb

## 2023-04-29 DIAGNOSIS — I471 Supraventricular tachycardia, unspecified: Secondary | ICD-10-CM | POA: Diagnosis not present

## 2023-04-29 DIAGNOSIS — E559 Vitamin D deficiency, unspecified: Secondary | ICD-10-CM

## 2023-04-29 DIAGNOSIS — K219 Gastro-esophageal reflux disease without esophagitis: Secondary | ICD-10-CM

## 2023-04-29 DIAGNOSIS — Z85840 Personal history of malignant neoplasm of eye: Secondary | ICD-10-CM

## 2023-04-29 DIAGNOSIS — I10 Essential (primary) hypertension: Secondary | ICD-10-CM

## 2023-04-29 DIAGNOSIS — J45909 Unspecified asthma, uncomplicated: Secondary | ICD-10-CM

## 2023-04-29 DIAGNOSIS — I2510 Atherosclerotic heart disease of native coronary artery without angina pectoris: Secondary | ICD-10-CM

## 2023-04-29 DIAGNOSIS — E785 Hyperlipidemia, unspecified: Secondary | ICD-10-CM

## 2023-04-29 DIAGNOSIS — R7303 Prediabetes: Secondary | ICD-10-CM

## 2023-04-29 DIAGNOSIS — M159 Polyosteoarthritis, unspecified: Secondary | ICD-10-CM | POA: Diagnosis not present

## 2023-04-29 DIAGNOSIS — I872 Venous insufficiency (chronic) (peripheral): Secondary | ICD-10-CM

## 2023-04-29 DIAGNOSIS — F3289 Other specified depressive episodes: Secondary | ICD-10-CM

## 2023-04-29 DIAGNOSIS — M8580 Other specified disorders of bone density and structure, unspecified site: Secondary | ICD-10-CM

## 2023-04-29 LAB — BASIC METABOLIC PANEL
BUN: 19 mg/dL (ref 6–23)
CO2: 28 mEq/L (ref 19–32)
Calcium: 9.6 mg/dL (ref 8.4–10.5)
Chloride: 98 mEq/L (ref 96–112)
Creatinine, Ser: 0.67 mg/dL (ref 0.40–1.20)
GFR: 86.98 mL/min (ref >60.00)
Glucose, Bld: 93 mg/dL (ref 70–99)
Potassium: 4.4 mEq/L (ref 3.5–5.1)
Sodium: 134 mEq/L — ABNORMAL LOW (ref 135–145)

## 2023-04-29 LAB — HEPATIC FUNCTION PANEL
ALT: 21 U/L (ref 0–35)
AST: 25 U/L (ref 0–37)
Albumin: 3.9 g/dL (ref 3.5–5.2)
Alkaline Phosphatase: 33 U/L — ABNORMAL LOW (ref 39–117)
Bilirubin, Direct: 0.1 mg/dL (ref 0.0–0.3)
Total Bilirubin: 0.6 mg/dL (ref 0.2–1.2)
Total Protein: 7.1 g/dL (ref 6.0–8.3)

## 2023-04-29 LAB — CBC WITH DIFFERENTIAL/PLATELET
Basophils Absolute: 0 10*3/uL (ref 0.0–0.1)
Basophils Relative: 0.3 % (ref 0.0–3.0)
Eosinophils Absolute: 0.2 10*3/uL (ref 0.0–0.7)
Eosinophils Relative: 5.4 % — ABNORMAL HIGH (ref 0.0–5.0)
HCT: 39.7 % (ref 36.0–46.0)
Hemoglobin: 12.8 g/dL (ref 12.0–15.0)
Lymphocytes Relative: 29.7 % (ref 12.0–46.0)
Lymphs Abs: 1.3 10*3/uL (ref 0.7–4.0)
MCHC: 32.3 g/dL (ref 30.0–36.0)
MCV: 87.3 fl (ref 78.0–100.0)
Monocytes Absolute: 0.5 10*3/uL (ref 0.1–1.0)
Monocytes Relative: 10.5 % (ref 3.0–12.0)
Neutro Abs: 2.3 10*3/uL (ref 1.4–7.7)
Neutrophils Relative %: 54.1 % (ref 43.0–77.0)
Platelets: 177 10*3/uL (ref 150.0–400.0)
RBC: 4.55 Mil/uL (ref 3.87–5.11)
RDW: 14.1 % (ref 11.5–15.5)
WBC: 4.3 10*3/uL (ref 4.0–10.5)

## 2023-04-29 LAB — LIPID PANEL
Cholesterol: 100 mg/dL (ref 0–200)
HDL: 27.9 mg/dL — ABNORMAL LOW (ref >39.00)
LDL Cholesterol: 43 mg/dL (ref 0–99)
NonHDL: 72.01
Total CHOL/HDL Ratio: 4
Triglycerides: 146 mg/dL (ref 0.0–149.0)
VLDL: 29.2 mg/dL (ref 0.0–40.0)

## 2023-04-29 LAB — VITAMIN D 25 HYDROXY (VIT D DEFICIENCY, FRACTURES): VITD: 80.54 ng/mL (ref 30.00–100.00)

## 2023-04-29 LAB — TSH: TSH: 1.94 u[IU]/mL (ref 0.35–5.50)

## 2023-04-29 LAB — HEMOGLOBIN A1C: Hgb A1c MFr Bld: 6 % (ref 4.6–6.5)

## 2023-04-29 MED ORDER — SCOPOLAMINE 1 MG/3DAYS TD PT72: 1.0000 | MEDICATED_PATCH | TRANSDERMAL | 0 refills | Status: DC

## 2023-04-29 MED ORDER — ALENDRONATE SODIUM 35 MG PO TABS: 35.0000 mg | ORAL_TABLET | ORAL | 3 refills | Status: DC

## 2023-04-29 NOTE — Progress Notes (Signed)
Subjective:    Patient ID: Natalie Hall, female    DOB: September 03, 1951, 72 y.o.   MRN: [id number]  HPI Here to follow up on issues. In general she feels well. She recently saw a urogynecologist who fitted her with a pessary for a uterine prolapse. Her DEXA on 04-29-23 showed stable osteopenia, but her orthopedist, Dr. Myra Rude felt she should start on a bone building medication due to her hx of several fractures. She and her husband will be leaving on a cruise to [place] in a few months, and she asks for something to prevent sea sickness. Her BP is stable. Her PSVT has been controlled. Her GERD and asthma are stable.    Review of Systems  Constitutional: Negative.   HENT: Negative.    Eyes: Negative.   Respiratory: Negative.    Cardiovascular: Negative.   Gastrointestinal: Negative.   Genitourinary:  Negative for decreased urine volume, difficulty urinating, dyspareunia, dysuria, enuresis, flank pain, frequency, hematuria, pelvic pain and urgency.  Musculoskeletal: Negative.   Skin: Negative.   Neurological: Negative.  Negative for headaches.  Psychiatric/Behavioral: Negative.         Objective:   Physical Exam Constitutional:      General: She is not in acute distress.    Appearance: She is well-developed. She is obese.  HENT:     Head: Normocephalic and atraumatic.     Right Ear: External ear normal.     Left Ear: External ear normal.     Nose: Nose normal.     Mouth/Throat:     Pharynx: No oropharyngeal exudate.  Eyes:     General: No scleral icterus.    Conjunctiva/sclera: Conjunctivae normal.     Pupils: Pupils are equal, round, and reactive to light.  Neck:     Thyroid: No thyromegaly.     Vascular: No JVD.  Cardiovascular:     Rate and Rhythm: Normal rate and regular rhythm.     Pulses: Normal pulses.     Heart sounds: Normal heart sounds. No murmur heard.    No friction rub. No gallop.  Pulmonary:     Effort: Pulmonary effort is normal. No respiratory distress.      Breath sounds: Normal breath sounds. No wheezing or rales.  Chest:     Chest Jutte: No tenderness.  Abdominal:     General: Bowel sounds are normal. There is no distension.     Palpations: Abdomen is soft. There is no mass.     Tenderness: There is no abdominal tenderness. There is no guarding or rebound.  Musculoskeletal:        General: No tenderness. Normal range of motion.     Cervical back: Normal range of motion and neck supple.  Lymphadenopathy:     Cervical: No cervical adenopathy.  Skin:    General: Skin is warm and dry.     Findings: No erythema or rash.  Neurological:     General: No focal deficit present.     Mental Status: She is alert and oriented to person, place, and time.     Cranial Nerves: No cranial nerve deficit.     Motor: No abnormal muscle tone.     Coordination: Coordination normal.     Deep Tendon Reflexes: Reflexes are normal and symmetric. Reflexes normal.  Psychiatric:        Mood and Affect: Mood normal.        Behavior: Behavior normal.        Thought Content: Thought  content normal.        Judgment: Judgment normal.           Assessment & Plan:  Her HTN and PSVT and CAD are stable. We will get fasting labs to check lipids, etc. Check an A1c and insulin level for the prediabetes. Start on Alendronate 35 mg weekly for the osteopenia. She can use transdermal scopolamine patches on her cruise. Get a CXR today and set up an abdominal US for her hx of eye melanoma. We spent a total of ( 35  ) minutes reviewing records and discussing these issues.  Gershon Crane, MD

## 2023-05-01 LAB — INSULIN AND C-PEPTIDE, SERUM
C-Peptide: 5 ng/mL — ABNORMAL HIGH (ref 1.1–4.4)
INSULIN: 19.4 u[IU]/mL (ref 2.6–24.9)

## 2023-05-02 ENCOUNTER — Ambulatory Visit (INDEPENDENT_AMBULATORY_CARE_PROVIDER_SITE_OTHER): Payer: Medicare Other | Admitting: Bariatrics

## 2023-05-02 ENCOUNTER — Encounter: Payer: Self-pay | Admitting: Bariatrics

## 2023-05-02 VITALS — BP 125/75 | HR 63 | Temp 98.0°F | Ht 64.0 in | Wt 193.0 lb

## 2023-05-02 DIAGNOSIS — Z6833 Body mass index (BMI) 33.0-33.9, adult: Secondary | ICD-10-CM

## 2023-05-02 DIAGNOSIS — I10 Essential (primary) hypertension: Secondary | ICD-10-CM

## 2023-05-02 DIAGNOSIS — E669 Obesity, unspecified: Secondary | ICD-10-CM

## 2023-05-02 NOTE — Progress Notes (Signed)
   WEIGHT SUMMARY AND BIOMETRICS  Weight Lost Since Last Visit: 0  Weight Gained Since Last Visit: 0   Vitals Temp: 98 F (36.7 C) BP: 125/75 Pulse Rate: 63 SpO2: 99 %   Anthropometric Measurements Height: 5\' 4"  (1.626 m) Weight: 193 lb (87.5 kg) BMI (Calculated): 33.11 Weight at Last Visit: 193lb Weight Lost Since Last Visit: 0 Weight Gained Since Last Visit: 0 Starting Weight: 213lb Total Weight Loss (lbs): 20 lb (9.072 kg)   Body Composition  Body Fat %: 49.2 % Fat Mass (lbs): 95.2 lbs Muscle Mass (lbs): 93.4 lbs Total Body Water (lbs): 70 lbs Visceral Fat Rating : 15   Other Clinical Data Fasting: no Labs: no Today's Visit #: 71 Starting Date: 08/04/18    OBESITY Natalie Hall is here to discuss her progress with her obesity treatment plan along with follow-up of her obesity related diagnoses.     Nutrition Plan: the Category 2 plan - 60-70% adherence.  Current exercise: none  Interim History:  Her weight remains the same.  Protein intake is as prescribed, Is not skipping meals, Meeting calorie goals., and Water intake is adequate.   Hunger is moderately controlled.  Cravings are moderately controlled.  Assessment/Plan:   Hypertension Hypertension well controlled.  Medication(s): Ramipril and Toprol XL  BP Readings from Last 3 Encounters:  05/02/23 125/75  04/29/23 118/64  04/22/23 135/77   Lab Results  Component Value Date   CREATININE 0.67 04/29/2023   CREATININE 0.73 07/23/2022   CREATININE 0.64 04/27/2022   Lab Results  Component Value Date   GFR 86.98 04/29/2023   GFR 88.57 04/27/2022   GFR 89.56 04/13/2021    Plan: Continue all antihypertensives at current dosages. No added salt. Will keep sodium content to 1,500 mg or less per day.     Generalized Obesity: Current BMI BMI (Calculated): 33.11   She will lower her calories by 150 calories per day.  She will increase her exercise (chair exercise and water).   Natalie Hall  is currently in the action stage of change. As such, her goal is to continue with weight loss efforts.  She has agreed to keeping a food journal with goal of 1,200 calories and 80 grams of protein daily.  Exercise goals: Older adults should do exercises that maintain or improve balance if they are at risk of falling.   Behavioral modification strategies: increasing lean protein intake, better snacking choices, planning for success, and mindful eating.  Natalie Hall has agreed to follow-up with our clinic in 4 weeks.      Objective:   VITALS: Per patient if applicable, see vitals. GENERAL: Alert and in no acute distress. CARDIOPULMONARY: No increased WOB. Speaking in clear sentences.  PSYCH: Pleasant and cooperative. Speech normal rate and rhythm. Affect is appropriate. Insight and judgement are appropriate. Attention is focused, linear, and appropriate.  NEURO: Oriented as arrived to appointment on time with no prompting.   Attestation Statements:    This was prepared with the assistance of Engineer, civil (consulting).  Occasional wrong-word or sound-a-like substitutions may have occurred due to the inherent limitations of voice recognition software.   Corinna Capra, DO

## 2023-05-08 ENCOUNTER — Encounter: Payer: Self-pay | Admitting: Obstetrics and Gynecology

## 2023-05-08 ENCOUNTER — Ambulatory Visit (INDEPENDENT_AMBULATORY_CARE_PROVIDER_SITE_OTHER): Payer: Medicare Other | Admitting: Obstetrics and Gynecology

## 2023-05-08 ENCOUNTER — Other Ambulatory Visit (HOSPITAL_COMMUNITY)
Admission: RE | Admit: 2023-05-08 | Discharge: 2023-05-08 | Disposition: A | Discharge status: Home / Self Care | Payer: Medicare Other | Source: Ambulatory Visit | Attending: Obstetrics and Gynecology | Admitting: Obstetrics and Gynecology

## 2023-05-08 VITALS — BP 104/66 | HR 79

## 2023-05-08 DIAGNOSIS — N898 Other specified noninflammatory disorders of vagina: Secondary | ICD-10-CM | POA: Insufficient documentation

## 2023-05-08 DIAGNOSIS — N816 Rectocele: Secondary | ICD-10-CM

## 2023-05-08 DIAGNOSIS — N8110 Cystocele, unspecified: Secondary | ICD-10-CM

## 2023-05-08 DIAGNOSIS — N993 Prolapse of vaginal vault after hysterectomy: Secondary | ICD-10-CM

## 2023-05-08 MED ORDER — FLUCONAZOLE 150 MG PO TABS
150.0000 mg | ORAL_TABLET | Freq: Once | ORAL | 0 refills | Status: AC
Start: 2023-05-08 — End: 2023-05-08

## 2023-05-08 NOTE — Progress Notes (Signed)
[facility]   Subjective:     Chief Complaint: Pessary Fitting Natalie Hall is a 72 y.o. female is here for pessary fitting.)  History of Present Illness: Natalie Hall is a 72 y.o. female with stage III pelvic organ prolapse who presents today for a pessary fitting.    Past Medical History: Patient  has a past medical history of Asthma, Back pain, Choroidal nevus of left eye, Constipation, Coronary artery disease, Dysrhythmia, Fatty liver, GERD (gastroesophageal reflux disease), History of hiatal hernia, colonic polyp, Hyperlipidemia, Hypertension, Joint pain, Melanoma of eye, left (HCC), Myocardial infarction (HCC), Osteoarthritis, Osteoporosis, Shortness of breath, and Supraventricular tachycardia.   Past Surgical History: She  has a past surgical history that includes Breast biopsy; Carpal tunnel release; Cholecystectomy; Abdominal hysterectomy; Varicose vein surgery; Colonoscopy (08/14/2021); Polypectomy; Ankle surgery; Achilles tendon repair (10/07/2011); Total knee arthroplasty (Left, 12/21/2013); Knee Closed Reduction (Left, 02/03/2014); Cardiac catheterization (N/A, 03/14/2016); Cardiac catheterization (N/A, 03/14/2016); Cardiac catheterization (N/A, 05/21/2016); Cataract extraction w/ intraocular lens implant (Bilateral); Eye surgery (Left, 08/2016); Reverse shoulder arthroplasty; Reverse shoulder arthroplasty (Left, 06/28/2017); IR Radiologist Eval & Mgmt (12/09/2018); IR KYPHO THORACIC WITH BONE BIOPSY (12/18/2018); IR Radiologist Eval & Mgmt (04/30/2019); Gastrocnemius Recession (Left, 07/02/2019); Excision haglund's deformity with achilles tendon repair (Left, 07/02/2019); and Reverse shoulder arthroplasty (Right, 11/03/2021).   Medications: She has a current medication list which includes the following prescription(s): acetaminophen, albuterol, alendronate, aspirin, calcium carb-cholecalciferol, cetirizine, vitamin d, estradiol, ezetimibe, fluconazole, magnesium,  metformin, metoprolol succinate, multiple vitamins-minerals, myrbetriq, omeprazole, systane ultra, ramipril, rosuvastatin, scopolamine, and temazepam.   Allergies: Patient is allergic to codeine, levofloxacin in d5w, amoxicillin-pot clavulanate, and escitalopram oxalate.   Social History: Patient  reports that she has never smoked. She has never used smokeless tobacco. She reports that she does not drink alcohol and does not use drugs.      Objective:    BP 104/66   Pulse 79   LMP  (LMP Unknown)  Gen: No apparent distress, A&O x 3. Pelvic Exam: Normal external female genitalia; Bartholin's and Skene's glands normal in appearance; urethral meatus normal in appearance, no urethral masses. Discharge that is white and thick noted in the vagina. Concerning for yeast. Aptima swab obtained.    Attempted a 2 1/2in Gellhorn pessary and it was too uncomfortable for patient during insertion and it also did not seem to give support to the rectal Nabor.   A size #3 cube pessary (Lot : [id number]) was fitted. It was comfortable, stayed in place with valsalva and was an appropriate size on examination, with one finger fitting between the pessary and the vaginal walls. We tied a string to it and the patient demonstrated proper removal and replacement.   Assessment/Plan:    Assessment: Ms. Petrakis is a 72 y.o. with stage III pelvic organ prolapse who presents for a pessary fitting. Plan: She was fitted with a #3 cube pessary. She will keep the pessary in place until next visit. She will use estrogen.   Follow-up in 3 weeks for a pessary check or sooner as needed.  All questions were answered.    Selmer Dominion, NP

## 2023-05-09 LAB — CERVICOVAGINAL ANCILLARY ONLY
Bacterial Vaginitis (gardnerella): POSITIVE — AB
Candida Glabrata: NEGATIVE
Candida Vaginitis: POSITIVE — AB
Comment: NEGATIVE
Comment: NEGATIVE
Comment: NEGATIVE

## 2023-05-09 MED ORDER — METRONIDAZOLE 500 MG PO TABS: 500.0000 mg | ORAL_TABLET | Freq: Two times a day (BID) | ORAL | 0 refills | Status: AC

## 2023-05-09 NOTE — Addendum Note (Signed)
Addended by: Selmer Dominion on: 05/09/2023 12:28 PM   Modules accepted: Orders

## 2023-05-14 ENCOUNTER — Encounter: Payer: Self-pay | Admitting: Obstetrics and Gynecology

## 2023-05-15 ENCOUNTER — Ambulatory Visit
Admission: RE | Admit: 2023-05-15 | Discharge: 2023-05-15 | Disposition: A | Discharge status: Home / Self Care | Payer: Medicare Other | Source: Ambulatory Visit | Attending: Family Medicine | Admitting: Family Medicine

## 2023-05-15 DIAGNOSIS — Z85840 Personal history of malignant neoplasm of eye: Secondary | ICD-10-CM

## 2023-05-17 ENCOUNTER — Other Ambulatory Visit: Payer: Self-pay

## 2023-05-17 MED ORDER — FLUCONAZOLE 150 MG PO TABS: 150.0000 mg | ORAL_TABLET | Freq: Once | ORAL | 0 refills | Status: AC

## 2023-05-17 NOTE — Progress Notes (Signed)
Pt sent a message she is still having UTI sx.  1 refill was sent to the pharmacy. Pt was notified if the sx persist she will need to come in to be evaluated. Pt verbalized understanding  Pt said she has an appt on Thursday 05/23/23

## 2023-05-17 NOTE — Telephone Encounter (Signed)
1 refill was sent. Pt has a f/u appt on 05/23/23

## 2023-05-23 ENCOUNTER — Encounter: Payer: Self-pay | Admitting: Obstetrics and Gynecology

## 2023-05-23 ENCOUNTER — Ambulatory Visit (INDEPENDENT_AMBULATORY_CARE_PROVIDER_SITE_OTHER): Payer: Medicare Other | Admitting: Obstetrics and Gynecology

## 2023-05-23 VITALS — BP 130/76 | HR 73

## 2023-05-23 DIAGNOSIS — N993 Prolapse of vaginal vault after hysterectomy: Secondary | ICD-10-CM | POA: Diagnosis not present

## 2023-05-23 DIAGNOSIS — Z4689 Encounter for fitting and adjustment of other specified devices: Secondary | ICD-10-CM

## 2023-05-23 DIAGNOSIS — N8110 Cystocele, unspecified: Secondary | ICD-10-CM

## 2023-05-23 DIAGNOSIS — N816 Rectocele: Secondary | ICD-10-CM

## 2023-05-23 NOTE — Progress Notes (Signed)
[facility]   Subjective:     Chief Complaint:  Chief Complaint  Patient presents with   Pessary Check   History of Present Illness: Natalie Hall is a 72 y.o. female with stage III pelvic organ prolapse who presents for a pessary check. She is using a size #3 cube pessary. The pessary has been working okay, but she has noticed some tissue coming around the pessary . She is using vaginal estrogen. She denies vaginal bleeding.  Past Medical History: Patient  has a past medical history of Asthma, Back pain, Choroidal nevus of left eye, Constipation, Coronary artery disease, Dysrhythmia, Fatty liver, GERD (gastroesophageal reflux disease), History of hiatal hernia, colonic polyp, Hyperlipidemia, Hypertension, Joint pain, Melanoma of eye, left (HCC), Myocardial infarction (HCC), Osteoarthritis, Osteoporosis, Shortness of breath, and Supraventricular tachycardia.   Past Surgical History: She  has a past surgical history that includes Breast biopsy; Carpal tunnel release; Cholecystectomy; Abdominal hysterectomy; Varicose vein surgery; Colonoscopy (08/14/2021); Polypectomy; Ankle surgery; Achilles tendon repair (10/07/2011); Total knee arthroplasty (Left, 12/21/2013); Knee Closed Reduction (Left, 02/03/2014); Cardiac catheterization (N/A, 03/14/2016); Cardiac catheterization (N/A, 03/14/2016); Cardiac catheterization (N/A, 05/21/2016); Cataract extraction w/ intraocular lens implant (Bilateral); Eye surgery (Left, 08/2016); Reverse shoulder arthroplasty; Reverse shoulder arthroplasty (Left, 06/28/2017); IR Radiologist Eval & Mgmt (12/09/2018); IR KYPHO THORACIC WITH BONE BIOPSY (12/18/2018); IR Radiologist Eval & Mgmt (04/30/2019); Gastrocnemius Recession (Left, 07/02/2019); Excision haglund's deformity with achilles tendon repair (Left, 07/02/2019); and Reverse shoulder arthroplasty (Right, 11/03/2021).   Medications: She has a current medication list which includes the following  prescription(s): acetaminophen, albuterol, alendronate, aspirin, calcium carb-cholecalciferol, cetirizine, vitamin d, estradiol, ezetimibe, magnesium, metformin, metoprolol succinate, multiple vitamins-minerals, myrbetriq, omeprazole, systane ultra, ramipril, rosuvastatin, scopolamine, and temazepam.   Allergies: Patient is allergic to codeine, levofloxacin in d5w, amoxicillin-pot clavulanate, and escitalopram oxalate.   Social History: Patient  reports that she has never smoked. She has never used smokeless tobacco. She reports that she does not drink alcohol and does not use drugs.      Objective:    Physical Exam: BP 130/76   Pulse 73   LMP  (LMP Unknown)  Gen: No apparent distress, A&O x 3. Detailed Urogynecologic Evaluation:  Pelvic Exam: Normal external female genitalia; Bartholin's and Skene's glands normal in appearance; urethral meatus normal in appearance, no urethral masses or discharge. The pessary was noted to be in place. It was removed and cleaned. Speculum exam revealed no lesions in the vagina. The pessary changed to a #2 Marland which seemed to better support the posterior vaginal [place].      Assessment/Plan:    Assessment: Natalie Hall is a 72 y.o. with stage III pelvic organ prolapse here for a pessary check. She is doing well.  Plan: She will keep the pessary in place until next visit. She will continue to use estrogen. She will follow-up in 3 months for a pessary check or sooner as needed.   She would like to discuss surgical options with the surgeon. Will make her next appointment with surgeon so she can plan from there.   She also endorsed some mild breast tenderness and was inquiring if this is related to estrogen cream use. We discussed the cream does not go systemically but if this keeps happening we can switch it to a vitamin E cream or lubrication with hyaluronic acid.   All questions were answered.

## 2023-05-28 ENCOUNTER — Encounter: Payer: Self-pay | Admitting: Obstetrics and Gynecology

## 2023-05-30 ENCOUNTER — Ambulatory Visit (INDEPENDENT_AMBULATORY_CARE_PROVIDER_SITE_OTHER): Payer: Medicare Other | Admitting: Bariatrics

## 2023-05-30 ENCOUNTER — Encounter: Payer: Self-pay | Admitting: Bariatrics

## 2023-05-30 VITALS — BP 136/77 | HR 65 | Temp 98.1°F | Ht 64.0 in | Wt 193.0 lb

## 2023-05-30 DIAGNOSIS — E785 Hyperlipidemia, unspecified: Secondary | ICD-10-CM

## 2023-05-30 DIAGNOSIS — R7303 Prediabetes: Secondary | ICD-10-CM

## 2023-05-30 DIAGNOSIS — E669 Obesity, unspecified: Secondary | ICD-10-CM

## 2023-05-30 DIAGNOSIS — Z6833 Body mass index (BMI) 33.0-33.9, adult: Secondary | ICD-10-CM | POA: Diagnosis not present

## 2023-05-30 MED ORDER — METFORMIN HCL 500 MG PO TABS: 500.0000 mg | ORAL_TABLET | Freq: Two times a day (BID) | ORAL | 0 refills | Status: DC

## 2023-05-30 NOTE — Progress Notes (Signed)
WEIGHT SUMMARY AND BIOMETRICS  Weight Lost Since Last Visit: 0  Weight Gained Since Last Visit: 0   Vitals Temp: 98.1 F (36.7 C) BP: 136/77 Pulse Rate: 65 SpO2: 99 %   Anthropometric Measurements Height: 5\' 4"  (1.626 m) Weight: 193 lb (87.5 kg) BMI (Calculated): 33.11 Weight at Last Visit: 193lb Weight Lost Since Last Visit: 0 Weight Gained Since Last Visit: 0 Starting Weight: 213lb Total Weight Loss (lbs): 20 lb (9.072 kg)   Body Composition  Body Fat %: 49 % Fat Mass (lbs): 94.8 lbs Muscle Mass (lbs): 93.8 lbs Total Body Water (lbs): 68.4 lbs Visceral Fat Rating : 15   Other Clinical Data Fasting: no Labs: no Today's Visit #: 63lb Starting Date: 08/04/18    OBESITY [name] is here to discuss her progress with her obesity treatment plan along with follow-up of her obesity related diagnoses.     Nutrition Plan: keeping a food journal with goal of 1200 calories and 80 grams of protein daily - 60-70% adherence.  Current exercise: none  Interim History:  Her weight remains the same. She is under a lot of stress at this time (spouse ill and mother).  Eating all of the food on the plan., Protein intake is as prescribed, Is not skipping meals, Water intake is adequate., and Denies polyphagia  Pharmacotherapy: Erandy is on Metformin 500 mg once daily breakfast Adverse side effects: None Hunger is moderately controlled.  Cravings are moderately controlled.  Assessment/Plan:   1. Prediabetes Prediabetes Last A1c was 6.0  Medication(s):  Metformin 500 mg once daily breakfast Lab Results  Component Value Date   HGBA1C 6.0 04/29/2023   HGBA1C 6.0 04/27/2022   HGBA1C 6.1 (H) 10/20/2021   HGBA1C 5.9 04/13/2021   HGBA1C 5.5 12/06/2020   Lab Results  Component Value Date   INSULIN 19.4 04/29/2023   INSULIN 18.6 07/23/2022   INSULIN 16.6 12/06/2020   INSULIN 18.5 11/17/2019   INSULIN 15.4 11/20/2018    Plan: Will minimize all refined  carbohydrates both sweets and starches.  Will work on the plan and exercise.  Consider both aerobic and resistance training.  Will keep protein, water, and fiber intake high.  Increase Polyunsaturated and Monounsaturated fats to increase satiety and encourage weight loss.  Aim for 7 to 9 hours of sleep nightly.  Will continue medications.  Continue and refill Metformin 500 mg once daily breakfast   Hyperlipidemia LDL is at goal. Medication(s): Crestor  Cardiovascular risk factors: advanced age (older than 52 for men, 43 for women), dyslipidemia, obesity (BMI >= 30 kg/m2), and sedentary lifestyle  Lab Results  Component Value Date   CHOL 100 04/29/2023   HDL 27.90 (L) 04/29/2023   LDLCALC 43 04/29/2023   LDLDIRECT 96.0 03/11/2018   TRIG 146.0 04/29/2023   CHOLHDL 4 04/29/2023   Lab Results  Component Value Date   ALT 21 04/29/2023   AST 25 04/29/2023   ALKPHOS 33 (L) 04/29/2023   BILITOT 0.6 04/29/2023   The ASCVD Risk score (Arnett DK, et al., 2019) failed to calculate for the following reasons:   The patient has a prior MI or stroke diagnosis  Plan:  Continue statin.  Information sheet on healthy vs unhealthy fats.  Will avoid all trans fats.  Will read labels Will minimize saturated fats except the following: low fat meats in moderation, diary, and limited dark chocolate.  Increase Omega 3 in foods, and consider an Omega 3 supplement.  [place] : "Practical Tips for Travel" .  Generalized Obesity: Current BMI BMI (Calculated): 33.11   Pharmacotherapy Plan Continue and increase dose  Metformin 500 mg twice daily with meals  Latarya is currently in the action stage of change. As such, her goal is to continue with weight loss efforts.  She has agreed to keeping a food journal with goal of 1,200 calories and 80 grams of protein daily.   Exercise goals: Older adults should determine their level of effort for physical activity relative to their level of fitness.    Behavioral modification strategies: increasing lean protein intake, decreasing simple carbohydrates , no meal skipping, meal planning , increase water intake, better snacking choices, planning for success, travel eating strategies, and mindful eating.  Kiela has agreed to follow-up with our clinic in 4 weeks.    Objective:   VITALS: Per patient if applicable, see vitals. GENERAL: Alert and in no acute distress. CARDIOPULMONARY: No increased WOB. Speaking in clear sentences.  PSYCH: Pleasant and cooperative. Speech normal rate and rhythm. Affect is appropriate. Insight and judgement are appropriate. Attention is focused, linear, and appropriate.  NEURO: Oriented as arrived to appointment on time with no prompting.   Attestation Statements:    This was prepared with the assistance of Engineer, civil (consulting).  Occasional wrong-word or sound-a-like substitutions may have occurred due to the inherent limitations of voice recognition software.   Corinna Capra, DO

## 2023-07-11 ENCOUNTER — Ambulatory Visit (INDEPENDENT_AMBULATORY_CARE_PROVIDER_SITE_OTHER): Payer: Medicare Other | Admitting: Bariatrics

## 2023-07-11 VITALS — BP 139/79 | HR 63 | Temp 98.1°F | Ht 64.0 in | Wt 195.0 lb

## 2023-07-11 DIAGNOSIS — Z6833 Body mass index (BMI) 33.0-33.9, adult: Secondary | ICD-10-CM | POA: Diagnosis not present

## 2023-07-11 DIAGNOSIS — E785 Hyperlipidemia, unspecified: Secondary | ICD-10-CM

## 2023-07-11 DIAGNOSIS — R7303 Prediabetes: Secondary | ICD-10-CM | POA: Diagnosis not present

## 2023-07-11 DIAGNOSIS — E669 Obesity, unspecified: Secondary | ICD-10-CM

## 2023-07-11 LAB — HM DIABETES EYE EXAM

## 2023-07-11 NOTE — Progress Notes (Signed)
WEIGHT SUMMARY AND BIOMETRICS  Weight Gained Since Last Visit: 2lb   Vitals Temp: 98.1 F (36.7 C) BP: 139/79 Pulse Rate: 63 SpO2: 99 %   Anthropometric Measurements Height: 5\' 4"  (1.626 m) Weight: 195 lb (88.5 kg) BMI (Calculated): 33.46 Weight at Last Visit: 193lb Weight Lost Since Last Visit: 0 Weight Gained Since Last Visit: 2lb Starting Weight: 213lb Total Weight Loss (lbs): 18 lb (8.165 kg)   Body Composition  Body Fat %: 49.4 % Fat Mass (lbs): 96.4 lbs Muscle Mass (lbs): 93.6 lbs Total Body Water (lbs): 68.8 lbs Visceral Fat Rating : 15   Other Clinical Data Fasting: no Labs: no Today's Visit #: 28 Starting Date: 08/04/18    OBESITY Natalie Hall is here to discuss her progress with her obesity treatment plan along with follow-up of her obesity related diagnoses.     Nutrition Plan: keeping a food journal with goal of 1200 calories and 80 grams of protein daily - 70% adherence.  Current exercise: walking  Interim History:  She is up 2 lbs.since her last visit.  She was on a trip for 18 days.  Eating all of the food on the plan., Protein intake is as prescribed, Is not exceeding snack calorie allotment, Is skipping meals, Water intake is adequate., and Denies polyphagia  Pharmacotherapy: Natalie Hall is on Metformin 500 mg twice daily with meals Adverse side effects: None Hunger is moderately controlled.  Cravings are moderately controlled.  Assessment/Plan:   Prediabetes Last A1c was 6.1  Medication( Metformin 500 mg twice daily with meals Lab Results  Component Value Date   HGBA1C 6.0 04/29/2023   HGBA1C 6.0 04/27/2022   HGBA1C 6.1 (H) 10/20/2021   HGBA1C 5.9 04/13/2021   HGBA1C 5.5 12/06/2020   Lab Results  Component Value Date   INSULIN 19.4 04/29/2023   INSULIN 18.6 07/23/2022   INSULIN 16.6 12/06/2020   INSULIN 18.5 11/17/2019   INSULIN 15.4 11/20/2018    Plan: Will minimize all refined carbohydrates both sweets and  starches.  Will work on the plan and exercise.  Consider both aerobic and resistance training.  Will keep protein, water, and fiber intake high.  Aim for 7 to 9 hours of sleep nightly.  Will continue medications.  Will add weights to her regimen and will continue to walk.  Continue Metformin 500 mg twice daily with meals Will put in a place so that she will not forget the second dose.   Hyperlipidemia LDL is at goal. Medication(s): Crestor Cardiovascular risk factors: advanced age (older than 80 for men, 38 for women), dyslipidemia, obesity (BMI >= 30 kg/m2), and sedentary lifestyle  Lab Results  Component Value Date   CHOL 100 04/29/2023   HDL 27.90 (L) 04/29/2023   LDLCALC 43 04/29/2023   LDLDIRECT 96.0 03/11/2018   TRIG 146.0 04/29/2023   CHOLHDL 4 04/29/2023   Lab Results  Component Value Date   ALT 21 04/29/2023   AST 25 04/29/2023   ALKPHOS 33 (L) 04/29/2023   BILITOT 0.6 04/29/2023   The ASCVD Risk score (Arnett DK, et al., 2019) failed to calculate for the following reasons:   The patient has a prior MI or stroke diagnosis  Plan:  Continue statin.  Will avoid all trans fats.  Will read labels Will minimize saturated fats except the following: low fat meats in moderation, diary, and limited dark chocolate.  Will have a protein shake if she misses a meal to enhance metabolism.     Generalized Obesity: Current BMI BMI (  Calculated): 33.46   Pharmacotherapy Plan Continue  Metformin 500 mg twice daily with meals  Natalie Hall is currently in the action stage of change. As such, her goal is to continue with weight loss efforts.  She has agreed to keeping a food journal with goal of 1,200 calories and 80 grams of protein daily.  Exercise goals: Older adults should follow the adult guidelines. When older adults cannot meet the adult guidelines, they should be as physically active as their abilities and conditions will allow.  She will continue her walking.   Behavioral  modification strategies: increasing lean protein intake, decreasing simple carbohydrates , no meal skipping, meal planning , increase water intake, increasing fiber rich foods, and mindful eating.  Natalie Hall has agreed to follow-up with our clinic in 4 weeks.      Objective:   VITALS: Per patient if applicable, see vitals. GENERAL: Alert and in no acute distress. CARDIOPULMONARY: No increased WOB. Speaking in clear sentences.  PSYCH: Pleasant and cooperative. Speech normal rate and rhythm. Affect is appropriate. Insight and judgement are appropriate. Attention is focused, linear, and appropriate.  NEURO: Oriented as arrived to appointment on time with no prompting.   Attestation Statements:    This was prepared with the assistance of Engineer, civil (consulting).  Occasional wrong-word or sound-a-like substitutions may have occurred due to the inherent limitations of voice recognition software. Corinna Capra, DO

## 2023-08-05 ENCOUNTER — Ambulatory Visit (INDEPENDENT_AMBULATORY_CARE_PROVIDER_SITE_OTHER): Payer: Medicare Other | Admitting: Obstetrics and Gynecology

## 2023-08-05 ENCOUNTER — Other Ambulatory Visit: Payer: Self-pay

## 2023-08-05 ENCOUNTER — Encounter: Payer: Self-pay | Admitting: Obstetrics and Gynecology

## 2023-08-05 VITALS — BP 143/80 | HR 67

## 2023-08-05 DIAGNOSIS — N3281 Overactive bladder: Secondary | ICD-10-CM | POA: Diagnosis not present

## 2023-08-05 DIAGNOSIS — N993 Prolapse of vaginal vault after hysterectomy: Secondary | ICD-10-CM

## 2023-08-05 DIAGNOSIS — N393 Stress incontinence (female) (male): Secondary | ICD-10-CM | POA: Diagnosis not present

## 2023-08-05 MED ORDER — MYRBETRIQ 50 MG PO TB24: 50.0000 mg | ORAL_TABLET | Freq: Every day | ORAL | 3 refills | Status: DC

## 2023-08-05 NOTE — Progress Notes (Signed)
[facility] Return Visit  SUBJECTIVE  History of Present Illness: Natalie Hall is a 72 y.o. female seen in follow-up for prolapse and incontinence. She has a #2 marland pessary.   She feels the pessary has worked ok but can feel it at times. Using estrogen 2-3 times per week. Denies vaginal bleeding.   She is taking magnesium for constipation. Sometimes uses dulcolax.   Overall the Myrbetriq is working well. She sometimes also has leakage with a cough or sneeze.   Past Medical History: Patient  has a past medical history of Asthma, Back pain, Choroidal nevus of left eye, Constipation, Coronary artery disease, Dysrhythmia, Fatty liver, GERD (gastroesophageal reflux disease), History of hiatal hernia, colonic polyp, Hyperlipidemia, Hypertension, Joint pain, Melanoma of eye, left (HCC), Myocardial infarction (HCC), Osteoarthritis, Osteoporosis, Shortness of breath, and Supraventricular tachycardia.   Past Surgical History: She  has a past surgical history that includes Breast biopsy; Carpal tunnel release; Cholecystectomy; Abdominal hysterectomy; Varicose vein surgery; Colonoscopy (08/14/2021); Polypectomy; Ankle surgery; Achilles tendon repair (10/07/2011); Total knee arthroplasty (Left, 12/21/2013); Knee Closed Reduction (Left, 02/03/2014); Cardiac catheterization (N/A, 03/14/2016); Cardiac catheterization (N/A, 03/14/2016); Cardiac catheterization (N/A, 05/21/2016); Cataract extraction w/ intraocular lens implant (Bilateral); Eye surgery (Left, 08/2016); Reverse shoulder arthroplasty; Reverse shoulder arthroplasty (Left, 06/28/2017); IR Radiologist Eval & Mgmt (12/09/2018); IR KYPHO THORACIC WITH BONE BIOPSY (12/18/2018); IR Radiologist Eval & Mgmt (04/30/2019); Gastrocnemius Recession (Left, 07/02/2019); Excision haglund's deformity with achilles tendon repair (Left, 07/02/2019); and Reverse shoulder arthroplasty (Right, 11/03/2021).   Medications: She has a current medication  list which includes the following prescription(s): acetaminophen, albuterol, alendronate, aspirin, calcium carb-cholecalciferol, cetirizine, vitamin d, estradiol, ezetimibe, magnesium, metformin, metoprolol succinate, multiple vitamins-minerals, myrbetriq, omeprazole, systane ultra, ramipril, rosuvastatin, scopolamine, and temazepam.   Allergies: Patient is allergic to codeine, levofloxacin in d5w, amoxicillin-pot clavulanate, and escitalopram oxalate.   Social History: Patient  reports that she has never smoked. She has never used smokeless tobacco. She reports that she does not drink alcohol and does not use drugs.      OBJECTIVE     Physical Exam: Vitals:   08/05/23 0917  BP: (!) 143/80  Pulse: 67   Gen: No apparent distress, A&O x 3.  Detailed Urogynecologic Evaluation:  Normal external genitalia. On speculum, normal vaginal mucosa. On bimanual, no masses present.    POP-Q  1                                            Aa   1                                           Ba  -3.5                                              C   4.5                                            Gh  5.5  Pb  6.5                                            tvl   -2.5                                            Ap  -2.5                                            Bp                                                 D      ASSESSMENT AND PLAN    Natalie Hall is a 72 y.o. with:  1. Vaginal vault prolapse after hysterectomy   2. Overactive bladder   3. SUI (stress urinary incontinence, female)    - will refill myrbetriq  Plan for surgery: Exam under anesthesia, colpocleisis, levator plication, perineorrhaphy, cystoscopy, possible midurethral sling.   - We reviewed the patient's specific anatomic and functional findings, with the assistance of diagrams, and together finalized the above procedure. The planned surgical procedures were discussed along with  the surgical risks outlined below, which were also provided on a detailed handout. Additional treatment options including expectant management, conservative management, medical management were discussed where appropriate.  We reviewed the benefits and risks of each treatment option.   - Will have her undergo urodynamic testing to assess need for a sling/ incontinence procedure  - Medical clearance: not required  - Anticoagulant use: No - Medicaid Hysterectomy form: No - Accepts blood transfusion: Yes - Expected length of stay: outpatient  Request sent for surgery scheduling. Return for urodynamics  Marguerita Beards, MD

## 2023-08-05 NOTE — Patient Instructions (Signed)
URODYNAMICS (UDS) TEST INFORMATION  IMPORTANT: Please try to arrive with a comfortably full bladder!    What is UDS? Urodynamics is a bladder test used to evaluate how your bladder and urethra (tube you urinate out of) work to help find out the cause of your bladder symptoms and evaluate your bladder function in order to make the best treatment plan for you.   What to expect? A nurse will perform the test and will be with you during the entire exam. First we will have to empty your bladder on a special toilet.  After you have emptied your bladder, very small catheters (plastic tubing) will be placed into your bladder and into your vagina (or rectum). These special small catheters measure pressure to help measure your bladder function.  Your bladder will be gently filled with water and you will be asked to cough and strain at several different points during the test.   You will then be asked to empty your bladder in the special toilet with the catheters in place. Most patients can urinate (pee) easily with the catheters in place since the catheters are so small. In total this procedure lasts about 45 minutes to 1 hour.  After your test is completed, you will return (or possibly be seen the same day) to review the results, talk about treatment options and make a plan moving forward.

## 2023-08-07 ENCOUNTER — Other Ambulatory Visit: Payer: Self-pay | Admitting: Family Medicine

## 2023-08-07 ENCOUNTER — Encounter: Payer: Self-pay | Admitting: Family Medicine

## 2023-08-08 ENCOUNTER — Ambulatory Visit: Payer: Medicare Other | Admitting: Bariatrics

## 2023-08-08 NOTE — Telephone Encounter (Signed)
Pt LOV was on 04/29/23 Last refill was done on 02/20/23 Please advise

## 2023-08-26 ENCOUNTER — Ambulatory Visit (INDEPENDENT_AMBULATORY_CARE_PROVIDER_SITE_OTHER): Payer: Medicare Other | Admitting: Bariatrics

## 2023-08-26 ENCOUNTER — Encounter: Payer: Self-pay | Admitting: Bariatrics

## 2023-08-26 VITALS — BP 149/85 | HR 62 | Temp 97.9°F | Ht 64.0 in | Wt 196.0 lb

## 2023-08-26 DIAGNOSIS — E669 Obesity, unspecified: Secondary | ICD-10-CM

## 2023-08-26 DIAGNOSIS — Z6833 Body mass index (BMI) 33.0-33.9, adult: Secondary | ICD-10-CM

## 2023-08-26 DIAGNOSIS — R7303 Prediabetes: Secondary | ICD-10-CM | POA: Diagnosis not present

## 2023-08-26 NOTE — Progress Notes (Signed)
WEIGHT SUMMARY AND BIOMETRICS  Weight Lost Since Last Visit: 0  Weight Gained Since Last Visit: 1lb   Vitals Temp: 97.9 F (36.6 C) BP: (!) 149/85 Pulse Rate: 62 SpO2: 99 %   Anthropometric Measurements Height: 5\' 4"  (1.626 m) Weight: 196 lb (88.9 kg) BMI (Calculated): 33.63 Weight at Last Visit: 195lb Weight Lost Since Last Visit: 0 Weight Gained Since Last Visit: 1lb Starting Weight: 213lb Total Weight Loss (lbs): 17 lb (7.711 kg)   Body Composition  Body Fat %: 49.9 % Fat Mass (lbs): 98.2 lbs Muscle Mass (lbs): 93.4 lbs Total Body Water (lbs): 70.8 lbs Visceral Fat Rating : 15   Other Clinical Data Fasting: no Labs: no Today's Visit #: 45 Starting Date: 08/04/18    OBESITY Natalie Hall is here to discuss her progress with her obesity treatment plan along with follow-up of her obesity related diagnoses.   Nutrition Plan: the Category 2 plan - 60% adherence.  Current exercise: walking  Interim History:  She is up 1 lb.  Eating all of the food on the plan., Protein intake is as prescribed, Is not skipping meals, and Water intake is adequate.  Pharmacotherapy: Bruchy is on Metformin 500 mg twice daily with meals. She is not always consistent with her second dose of Metformin.  Adverse side effects: None Hunger is moderately controlled.  Cravings are moderately controlled.  Assessment/Plan:   Prediabetes Last A1c was 6.0  Medication(s): Metformin 500 mg twice daily with meals Lab Results  Component Value Date   HGBA1C 6.0 04/29/2023   HGBA1C 6.0 04/27/2022   HGBA1C 6.1 (H) 10/20/2021   HGBA1C 5.9 04/13/2021   HGBA1C 5.5 12/06/2020   Lab Results  Component Value Date   INSULIN 19.4 04/29/2023   INSULIN 18.6 07/23/2022   INSULIN 16.6 12/06/2020   INSULIN 18.5 11/17/2019   INSULIN 15.4 11/20/2018    Plan: Will remember to take her Metformin consistently. Will put her pill cast close to where she takes her meals.  Will minimize all  refined carbohydrates both sweets and starches.  Will work on the plan and exercise.  Consider both aerobic and resistance training.  Will keep protein, water, and fiber intake high.  Aim for 7 to 9 hours of sleep nightly.   Continue Metformin 500 mg twice daily with meals    Generalized Obesity: Current BMI BMI (Calculated): 33.63   Pharmacotherapy Plan Continue  Metformin 500 mg twice daily with meals  [place] is currently in the action stage of change. As such, her goal is to continue with weight loss efforts.  She has agreed to keeping a food journal with goal of 1,200 calories and 80 grams of protein daily.  Exercise goals: Older adults should determine their level of effort for physical activity relative to their level of fitness.   Behavioral modification strategies: no meal skipping, meal planning , increase water intake, planning for success, and increasing vegetables.  Cameka has agreed to follow-up with our clinic in 4 weeks.    Objective:   VITALS: Per patient if applicable, see vitals. GENERAL: Alert and in no acute distress. CARDIOPULMONARY: No increased WOB. Speaking in clear sentences.  PSYCH: Pleasant and cooperative. Speech normal rate and rhythm. Affect is appropriate. Insight and judgement are appropriate. Attention is focused, linear, and appropriate.  NEURO: Oriented as arrived to appointment on time with no prompting.   Attestation Statements:  This was prepared with the assistance of Engineer, civil (consulting).  Occasional wrong-word or sound-a-like substitutions may have  occurred due to the inherent limitations of voice recognition software.   Corinna Capra, DO

## 2023-08-28 ENCOUNTER — Encounter: Payer: Self-pay | Admitting: Obstetrics and Gynecology

## 2023-08-28 ENCOUNTER — Other Ambulatory Visit: Payer: Self-pay | Admitting: Obstetrics and Gynecology

## 2023-08-28 ENCOUNTER — Ambulatory Visit (INDEPENDENT_AMBULATORY_CARE_PROVIDER_SITE_OTHER): Payer: Medicare Other | Admitting: Obstetrics and Gynecology

## 2023-08-28 ENCOUNTER — Encounter: Payer: Self-pay | Admitting: *Deleted

## 2023-08-28 VITALS — BP 163/82 | HR 62

## 2023-08-28 DIAGNOSIS — R350 Frequency of micturition: Secondary | ICD-10-CM

## 2023-08-28 DIAGNOSIS — N393 Stress incontinence (female) (male): Secondary | ICD-10-CM | POA: Diagnosis not present

## 2023-08-28 DIAGNOSIS — N3281 Overactive bladder: Secondary | ICD-10-CM

## 2023-08-28 DIAGNOSIS — N993 Prolapse of vaginal vault after hysterectomy: Secondary | ICD-10-CM

## 2023-08-28 LAB — POCT URINALYSIS DIPSTICK
Bilirubin, UA: NEGATIVE
Glucose, UA: NEGATIVE
Ketones, UA: NEGATIVE
Leukocytes, UA: NEGATIVE
Nitrite, UA: NEGATIVE
Protein, UA: NEGATIVE
Spec Grav, UA: <=1.005 — AB (ref 1.010–1.025)
Urobilinogen, UA: 0.2 U/dL
pH, UA: 7.5 (ref 5.0–8.0)

## 2023-08-28 NOTE — Progress Notes (Signed)
[facility] Urodynamics Procedure  Referring Physician: Nelwyn Salisbury, MD Date of Procedure: 08/28/2023  Natalie Hall is a 72 y.o. female who presents for urodynamic evaluation. Indication(s) for study: SUI  Vital Signs: BP (!) 163/82   Pulse 62   LMP  (LMP Unknown)   Laboratory Results: A catheterized urine specimen revealed:  POC urine:  Lab Results  Component Value Date   COLORU yellow 08/28/2023   CLARITYU clear 08/28/2023   GLUCOSEUR Negative 08/28/2023   BILIRUBINUR negative 08/28/2023   KETONESU negative 08/28/2023   SPECGRAV <=1.005 (A) 08/28/2023   RBCUR moderate 08/28/2023   PHUR 7.5 08/28/2023   PROTEINUR Negative 08/28/2023   UROBILINOGEN 0.2 08/28/2023   LEUKOCYTESUR Negative 08/28/2023      Voiding Diary: Deferred   Procedure Timeout:  The correct patient was verified and the correct procedure was verified. The patient was in the correct position and safety precautions were reviewed based on at the patient's history.  Urodynamic Procedure A 15F dual lumen urodynamics catheter was placed under sterile conditions into the patient's bladder. A 15F catheter was placed into the rectum in order to measure abdominal pressure. EMG patches were placed in the appropriate position.  All connections were confirmed and calibrations/adjusted made. Saline was instilled into the bladder through the dual lumen catheters.  Cough/valsalva pressures were measured periodically during filling.  Patient was allowed to void.  The bladder was then emptied of its residual.  UROFLOW: Revealed a Qmax of 45 mL/sec.  She voided 320 mL and had a residual of 50 mL.  It was a normal pattern and represented normal habits.   CMG: This was performed with sterile water in the sitting position at a fill rate of 20-30 mL/min.    First sensation of fullness was 101 mLs,  First urge was 130 mLs,  Strong urge was 169 mLs and  Capacity was 450 mLs  Stress incontinence was  demonstrated Highest positive Barrier CLPP was 114 cmH20 at 250 ml. Highest positive Barrier VLPP was 67 cmH20 at 250 ml.  Detrusor function was overactive, with phasic contractions seen.  The first occurred at 76 mL to 9.2 cm of water and was associated with urge.  Compliance:  Normal. End fill detrusor pressure was 0 cmH20.  UPP: MUCP with barrier reduction was 70 cm of water.    MICTURITION STUDY: Voiding was performed with reduction using scopettes in the sitting position.  Pdet at Qmax was 47 cm of water.  Qmax was 17.6 mL/sec.  It was a intermittent pattern.  She voided 330 mL and had a residual of 120 mL.  It was a volitional void, sustained detrusor contraction was present and abdominal straining was present  EMG: This was performed with patches.  She had voluntary contractions, recruitment with fill was present and urethral sphincter was relaxed with void.  The details of the procedure with the study tracings have been scanned into EPIC.   Urodynamic Impression:  1. Sensation was normal; capacity was normal 2. Stress Incontinence was demonstrated at normal pressures; 3. Detrusor Overactivity was demonstrated without leakage. 4. Emptying was dysfunctional with a mildly elevated PVR (120), a sustained detrusor contraction present but prolonged,  abdominal straining present, normal urethral sphincter activity on EMG.  Plan: - The patient will follow up  to discuss the findings and treatment options.

## 2023-08-28 NOTE — Progress Notes (Signed)
poct

## 2023-09-10 ENCOUNTER — Ambulatory Visit (INDEPENDENT_AMBULATORY_CARE_PROVIDER_SITE_OTHER): Payer: Medicare Other | Admitting: Obstetrics and Gynecology

## 2023-09-10 ENCOUNTER — Encounter: Payer: Self-pay | Admitting: Obstetrics and Gynecology

## 2023-09-10 DIAGNOSIS — N3281 Overactive bladder: Secondary | ICD-10-CM

## 2023-09-10 DIAGNOSIS — N993 Prolapse of vaginal vault after hysterectomy: Secondary | ICD-10-CM

## 2023-09-10 DIAGNOSIS — N393 Stress incontinence (female) (male): Secondary | ICD-10-CM | POA: Diagnosis not present

## 2023-09-10 NOTE — Assessment & Plan Note (Signed)
-   Reviewed options for treatment including urethral bulking and sling. She prefers a sling. Would recommend single incision sling as she had some voiding dysfunction with urodynamics.

## 2023-09-10 NOTE — Progress Notes (Signed)
[facility] Return Visit  SUBJECTIVE  History of Present Illness: Natalie Hall is a 72 y.o. female seen in follow-up for prolapse and incontinence.She underwent urodynamic testing. The myrbetriq has been working well to control her urgency symptoms.  Urodynamic Impression:  1. Sensation was normal; capacity was normal 2. Stress Incontinence was demonstrated at normal pressures; 3. Detrusor Overactivity was demonstrated without leakage. 4. Emptying was dysfunctional with a mildly elevated PVR (120), a sustained detrusor contraction present but prolonged,  abdominal straining present, normal urethral sphincter activity on EMG.  Past Medical History: Patient  has a past medical history of Asthma, Back pain, Choroidal nevus of left eye, Constipation, Coronary artery disease, Dysrhythmia, Fatty liver, GERD (gastroesophageal reflux disease), History of hiatal hernia, colonic polyp, Hyperlipidemia, Hypertension, Joint pain, Melanoma of eye, left (HCC), Myocardial infarction (HCC), Osteoarthritis, Osteoporosis, Shortness of breath, and Supraventricular tachycardia (HCC).   Past Surgical History: She  has a past surgical history that includes Breast biopsy; Carpal tunnel release; Cholecystectomy; Abdominal hysterectomy; Varicose vein surgery; Colonoscopy (08/14/2021); Polypectomy; Ankle surgery; Achilles tendon repair (10/07/2011); Total knee arthroplasty (Left, 12/21/2013); Knee Closed Reduction (Left, 02/03/2014); Cardiac catheterization (N/A, 03/14/2016); Cardiac catheterization (N/A, 03/14/2016); Cardiac catheterization (N/A, 05/21/2016); Cataract extraction w/ intraocular lens implant (Bilateral); Eye surgery (Left, 08/2016); Reverse shoulder arthroplasty; Reverse shoulder arthroplasty (Left, 06/28/2017); IR Radiologist Eval & Mgmt (12/09/2018); IR KYPHO THORACIC WITH BONE BIOPSY (12/18/2018); IR Radiologist Eval & Mgmt (04/30/2019); Gastrocnemius Recession (Left, 07/02/2019); Excision  haglund's deformity with achilles tendon repair (Left, 07/02/2019); and Reverse shoulder arthroplasty (Right, 11/03/2021).   Medications: She has a current medication list which includes the following prescription(s): acetaminophen, albuterol, alendronate, aspirin, calcium carb-cholecalciferol, cetirizine, vitamin d, estradiol, ezetimibe, magnesium, metformin, metoprolol succinate, multiple vitamins-minerals, myrbetriq, omeprazole, systane ultra, ramipril, rosuvastatin, scopolamine, and temazepam.   Allergies: Patient is allergic to codeine, levofloxacin in d5w, amoxicillin-pot clavulanate, and escitalopram oxalate.   Social History: Patient  reports that she has never smoked. She has never used smokeless tobacco. She reports that she does not drink alcohol and does not use drugs.      OBJECTIVE     Physical Exam: There were no vitals filed for this visit. Gen: No apparent distress, A&O x 3.  Detailed Urogynecologic Evaluation:  Deferred.    ASSESSMENT AND PLAN    Natalie Hall is a 72 y.o. with:  1. Vaginal vault prolapse after hysterectomy   2. SUI (stress urinary incontinence, female)   3. OAB (overactive bladder)     Vaginal vault prolapse after hysterectomy  SUI (stress urinary incontinence, female) Assessment & Plan: - Reviewed options for treatment including urethral bulking and sling. She prefers a sling. Would recommend single incision sling as she had some voiding dysfunction with urodynamics.    OAB (overactive bladder) Assessment & Plan: Continue with Myrbetriq     Plan for surgery: Exam under anesthesia, Exam under anesthesia, colpocleisis, levator plication, perineorrhaphy, cystoscopy, single incision sling   General Surgical Risks: For all procedures, there are risks of bleeding, infection, damage to surrounding organs including but not limited to bowel, bladder, blood vessels, ureters and nerves, and need for further surgery if an injury were to occur. These  risks are all low with minimally invasive surgery.   There are risks of numbness and weakness at any body site or buttock/rectal pain.  It is possible that baseline pain can be worsened by surgery, either with or without mesh. If surgery is vaginal, there is also a low risk of possible conversion to laparoscopy  or open abdominal incision where indicated. Very rare risks include blood transfusion, blood clot, heart attack, pneumonia, or death.   There is also a risk of short-term postoperative urinary retention with need to use a catheter. About half of patients need to go home from surgery with a catheter, which is then later removed in the office. The risk of long-term need for a catheter is very low. There is also a risk of worsening of overactive bladder.   Sling: The effectiveness of a midurethral vaginal mesh sling is approximately 85%, and thus, there will be times when you may leak urine after surgery, especially if your bladder is full or if you have a strong cough. There is a balance between making the sling tight enough to treat your leakage but not too tight so that you have long-term difficulty emptying your bladder. A mesh sling will not directly treat overactive bladder/urge incontinence and may worsen it.  There is an FDA safety notification on vaginal mesh procedures for prolapse but NOT mesh slings. We have extensive experience and training with mesh placement and we have close postoperative follow up to identify any potential complications from mesh. It is important to realize that this mesh is a permanent implant that cannot be easily removed. There are rare risks of mesh exposure (2-4%), pain with intercourse (0-7%), and infection (<1%). The risk of mesh exposure if more likely in a woman with risks for poor healing (prior radiation, poorly controlled diabetes, or immunocompromised). The risk of new or worsened chronic pain after mesh implant is more common in women with baseline chronic  pain and/or poorly controlled anxiety or depression. Approximately 2-4% of patients will experience longer-term post-operative voiding dysfunction that may require surgical revision of the sling. We also reviewed that postoperatively, her stream may not be as strong as before surgery.   Prolapse (with or without mesh): Risk factors for surgical failure  include things that put pressure on your pelvis and the surgical repair, including obesity, chronic cough, and heavy lifting or straining (including lifting children or adults, straining on the toilet, or lifting heavy objects such as furniture or anything weighing >25 lbs. Risks of recurrence is 20-30% with vaginal native tissue repair and a less than 10% with sacrocolpopexy with mesh.  Risk of recurrence with Colpocleisis is less than 5%.  - Medical clearance: not required  - Anticoagulant use: No - Medicaid Hysterectomy form: No - Accepts blood transfusion: Yes - Expected length of stay: outpatient  Return for pre op visit  Marguerita Beards, MD

## 2023-09-10 NOTE — Assessment & Plan Note (Signed)
Continue with Myrbetriq

## 2023-09-26 ENCOUNTER — Encounter: Payer: Self-pay | Admitting: Bariatrics

## 2023-09-26 ENCOUNTER — Ambulatory Visit: Payer: Medicare Other | Admitting: Bariatrics

## 2023-09-26 VITALS — BP 145/79 | HR 69 | Temp 97.7°F | Ht 64.0 in | Wt 192.0 lb

## 2023-09-26 DIAGNOSIS — Z6832 Body mass index (BMI) 32.0-32.9, adult: Secondary | ICD-10-CM

## 2023-09-26 DIAGNOSIS — E669 Obesity, unspecified: Secondary | ICD-10-CM | POA: Diagnosis not present

## 2023-09-26 DIAGNOSIS — R7303 Prediabetes: Secondary | ICD-10-CM

## 2023-09-26 DIAGNOSIS — E785 Hyperlipidemia, unspecified: Secondary | ICD-10-CM

## 2023-09-26 DIAGNOSIS — E66811 Obesity, class 1: Secondary | ICD-10-CM

## 2023-09-26 MED ORDER — METFORMIN HCL 500 MG PO TABS
500.0000 mg | ORAL_TABLET | Freq: Two times a day (BID) | ORAL | 0 refills | Status: DC
Start: 2023-09-26 — End: 2024-01-16

## 2023-09-26 NOTE — Progress Notes (Signed)
WEIGHT SUMMARY AND BIOMETRICS  Weight Lost Since Last Visit: 4lb  Weight Gained Since Last Visit: 0   Vitals Temp: 97.7 F (36.5 C) BP: (!) 145/79 Pulse Rate: 69 SpO2: 98 %   Anthropometric Measurements Height: 5\' 4"  (1.626 m) Weight: 192 lb (87.1 kg) BMI (Calculated): 32.94 Weight at Last Visit: 196lb Weight Lost Since Last Visit: 4lb Weight Gained Since Last Visit: 0 Starting Weight: 213lb Total Weight Loss (lbs): 21 lb (9.526 kg)   Body Composition  Body Fat %: 49.2 % Fat Mass (lbs): 94.6 lbs Muscle Mass (lbs): 92.6 lbs Total Body Water (lbs): 68.2 lbs Visceral Fat Rating : 15   Other Clinical Data Fasting: no Labs: no Today's Visit #: 89 Starting Date: 08/04/18    OBESITY Natalie Hall is here to discuss her progress with her obesity treatment plan along with follow-up of her obesity related diagnoses.    Nutrition Plan: the Category 1 plan - 80-90% adherence.  Current exercise: walking  Interim History:  She is down another 4 lbs since her last visit.  Eating all of the food on the plan., Protein intake is as prescribed, Is not skipping meals, Meeting calorie goals., and Water intake is adequate.   Pharmacotherapy: Kalyani is on Metformin 500 mg twice daily with meals Adverse side effects: None Hunger is moderately controlled.  Cravings are moderately controlled.  Assessment/Plan:   1. Prediabetes Prediabetes Last A1c was 6.0  Medication(s):  Metformin 500 mg twice daily with meals Lab Results  Component Value Date   HGBA1C 6.0 04/29/2023   HGBA1C 6.0 04/27/2022   HGBA1C 6.1 (H) 10/20/2021   HGBA1C 5.9 04/13/2021   HGBA1C 5.5 12/06/2020   Lab Results  Component Value Date   INSULIN 19.4 04/29/2023   INSULIN 18.6 07/23/2022   INSULIN 16.6 12/06/2020   INSULIN 18.5 11/17/2019   INSULIN 15.4 11/20/2018    Plan: Will minimize  all refined carbohydrates both sweets and starches.  Will work on the plan and exercise.  Consider both aerobic and resistance training.  Will keep protein, water, and fiber intake high.  Will eat more plant based, not too much.  Continue and refill Metformin 500 mg twice daily with meals   Hyperlipidemia LDL is at goal. Medication(s): Zetia, Crestor Cardiovascular risk factors: advanced age (older than 17 for men, 3 for women), dyslipidemia, obesity (BMI >= 30 kg/m2), and sedentary lifestyle  Lab Results  Component Value Date   CHOL 100 04/29/2023   HDL 27.90 (L) 04/29/2023   LDLCALC 43 04/29/2023   LDLDIRECT 96.0 03/11/2018   TRIG 146.0 04/29/2023   CHOLHDL 4 04/29/2023   Lab Results  Component Value Date   ALT 21 04/29/2023   AST 25 04/29/2023   ALKPHOS 33 (L) 04/29/2023   BILITOT 0.6 04/29/2023   The ASCVD Risk score (Arnett DK, et al., 2019) failed to calculate for the  following reasons:   The patient has a prior MI or stroke diagnosis  Plan:  Continue statin.  Information sheet on healthy vs unhealthy fats.  Will avoid all trans fats.  Will read labels Will minimize saturated fats except the following: low fat meats in moderation, diary, and limited dark chocolate.      Generalized Obesity: Current BMI BMI (Calculated): 32.94   Pharmacotherapy Plan Continue and refill  Metformin 500 mg twice daily with meals  Natalie Hall is currently in the action stage of change. As such, her goal is to continue with weight loss efforts.  She has agreed to the Category 1 plan.  Exercise goals: Older adults with chronic conditions should understand whether and how their conditions affect their ability to do regular physical activity safely.  Behavioral modification strategies: increasing lean protein intake, no meal skipping, decrease eating out, planning for success, increasing vegetables, decrease snacking , avoiding temptations, and mindful eating.  Dura has agreed to  follow-up with our clinic in 4 weeks.      Objective:   VITALS: Per patient if applicable, see vitals. GENERAL: Alert and in no acute distress. CARDIOPULMONARY: No increased WOB. Speaking in clear sentences.  PSYCH: Pleasant and cooperative. Speech normal rate and rhythm. Affect is appropriate. Insight and judgement are appropriate. Attention is focused, linear, and appropriate.  NEURO: Oriented as arrived to appointment on time with no prompting.   Attestation Statements:   This was prepared with the assistance of Engineer, civil (consulting).  Occasional wrong-word or sound-a-like substitutions may have occurred due to the inherent limitations of voice recognition   Corinna Capra, DO

## 2023-10-31 ENCOUNTER — Encounter: Payer: Self-pay | Admitting: Bariatrics

## 2023-10-31 ENCOUNTER — Ambulatory Visit (INDEPENDENT_AMBULATORY_CARE_PROVIDER_SITE_OTHER): Payer: Medicare Other | Admitting: Bariatrics

## 2023-10-31 VITALS — BP 136/72 | HR 63 | Temp 97.8°F | Ht 64.0 in | Wt 191.0 lb

## 2023-10-31 DIAGNOSIS — E669 Obesity, unspecified: Secondary | ICD-10-CM

## 2023-10-31 DIAGNOSIS — E6609 Other obesity due to excess calories: Secondary | ICD-10-CM

## 2023-10-31 DIAGNOSIS — R7303 Prediabetes: Secondary | ICD-10-CM | POA: Diagnosis not present

## 2023-10-31 DIAGNOSIS — Z6832 Body mass index (BMI) 32.0-32.9, adult: Secondary | ICD-10-CM

## 2023-10-31 NOTE — Progress Notes (Signed)
 WEIGHT SUMMARY AND BIOMETRICS  Weight Lost Since Last Visit: 1lb  Weight Gained Since Last Visit: 0   Vitals Temp: 97.8 F (36.6 C) BP: 136/72 Pulse Rate: 63 SpO2: 98 %   Anthropometric Measurements Height: 5' 4 (1.626 m) Weight: 191 lb (86.6 kg) BMI (Calculated): 32.77 Weight at Last Visit: 192lb Weight Lost Since Last Visit: 1lb Weight Gained Since Last Visit: 0 Starting Weight: 213lb Total Weight Loss (lbs): 22 lb (9.979 kg)   Body Composition  Body Fat %: 48.9 % Fat Mass (lbs): 93.4 lbs Muscle Mass (lbs): 92.8 lbs Total Body Water  (lbs): 68 lbs Visceral Fat Rating : 15   Other Clinical Data Fasting: no Labs: no Today's Visit #: 51 Starting Date: 08/04/18    OBESITY Lashara is here to discuss her progress with her obesity treatment plan along with follow-up of her obesity related diagnoses.    Nutrition Plan: the Category 2 plan - 70% adherence.  Current exercise: none  Interim History:  She is down 1 lb since her last time.  Eating all of the food on the plan., Protein intake is as prescribed, Is not skipping meals, and Water  intake is adequate. She wants to stay on track. She does not want to snack.   She is planning on having bladder repair surgery on January 27.  She states that her convalescent time will be approximately 6 weeks.    Pharmacotherapy: Genowefa is on Metformin  500 mg twice daily with meals Adverse side effects: None Hunger is moderately controlled.  Cravings are moderately controlled.  Assessment/Plan:   Prediabetes Last A1c was 6.0  Medication(s): Metformin  500 mg twice daily with meals Lab Results  Component Value Date   HGBA1C 6.0 04/29/2023   HGBA1C 6.0 04/27/2022   HGBA1C 6.1 (H) 10/20/2021   HGBA1C 5.9 04/13/2021   HGBA1C 5.5 12/06/2020   Lab Results  Component Value Date   INSULIN  19.4 04/29/2023    INSULIN  18.6 07/23/2022   INSULIN  16.6 12/06/2020   INSULIN  18.5 11/17/2019   INSULIN  15.4 11/20/2018    Plan: Will minimize all refined carbohydrates both sweets and starches.  Will work on the plan and exercise.  Consider both aerobic and resistance training.  Will keep protein, water , and fiber intake high.  Increase Polyunsaturated and Monounsaturated fats to increase satiety and encourage weight loss.  Aim for 7 to 9 hours of sleep nightly.  Patient's had questions about metformin  and we discussed metformin 's  risk benefit profile.   She was given information sheet on metformin . Continue Metformin  500 mg twice daily with meals    Generalized Obesity: Current BMI BMI (Calculated): 32.77   Pharmacotherapy Plan Continue  Metformin  500 mg twice daily with meals  Shavette is currently in the action stage of change. As such, her goal is to continue with weight loss efforts.  She has agreed to the Category  2 plan.  She was given handouts on high-protein options.   Exercise goals: Older adults should determine their level of effort for physical activity relative to their level of fitness.   Behavioral modification strategies: increasing lean protein intake, no meal skipping, meal planning , increase water  intake, better snacking choices, planning for success, avoiding temptations, and mindful eating.  Kennisha has agreed to follow-up with our clinic i the last week in March or the first week in April.       Objective:   VITALS: Per patient if applicable, see vitals. GENERAL: Alert and in no acute distress. CARDIOPULMONARY: No increased WOB. Speaking in clear sentences.  PSYCH: Pleasant and cooperative. Speech normal rate and rhythm. Affect is appropriate. Insight and judgement are appropriate. Attention is focused, linear, and appropriate.  NEURO: Oriented as arrived to appointment on time with no prompting.   Attestation Statements:    This was prepared with the assistance of  Engineer, Civil (consulting).  Occasional wrong-word or sound-a-like substitutions may have occurred due to the inherent limitations of voice recognition   Clayborne Daring, DO

## 2023-11-07 ENCOUNTER — Encounter: Payer: Self-pay | Admitting: Obstetrics and Gynecology

## 2023-11-07 ENCOUNTER — Ambulatory Visit: Payer: Medicare Other | Admitting: Obstetrics and Gynecology

## 2023-11-07 ENCOUNTER — Telehealth: Payer: Self-pay | Admitting: Internal Medicine

## 2023-11-07 VITALS — BP 115/76 | HR 80 | Ht 63.2 in | Wt 195.4 lb

## 2023-11-07 DIAGNOSIS — Z01818 Encounter for other preprocedural examination: Secondary | ICD-10-CM

## 2023-11-07 MED ORDER — ACETAMINOPHEN 500 MG PO TABS
500.0000 mg | ORAL_TABLET | Freq: Four times a day (QID) | ORAL | 0 refills | Status: AC | PRN
Start: 2023-11-07 — End: ?

## 2023-11-07 MED ORDER — OXYCODONE HCL 5 MG PO TABS
5.0000 mg | ORAL_TABLET | ORAL | 0 refills | Status: DC | PRN
Start: 2023-11-07 — End: 2024-01-09

## 2023-11-07 MED ORDER — IBUPROFEN 600 MG PO TABS
600.0000 mg | ORAL_TABLET | Freq: Four times a day (QID) | ORAL | 0 refills | Status: DC | PRN
Start: 2023-11-07 — End: 2024-01-09

## 2023-11-07 MED ORDER — POLYETHYLENE GLYCOL 3350 17 GM/SCOOP PO POWD
17.0000 g | Freq: Every day | ORAL | 0 refills | Status: DC
Start: 2023-11-07 — End: 2024-01-09

## 2023-11-07 NOTE — Telephone Encounter (Signed)
FYI Per [facility] they put a clearance in for this pt. But the clearance is NOT needed so they are cx request

## 2023-11-07 NOTE — H&P (Signed)
[facility] H&P  Subjective Chief Complaint: Natalie Hall presents for a preoperative encounter.   History of Present Illness: Natalie Hall is a 73 y.o. female who presents for preoperative visit.  She is scheduled to undergo  Exam under anesthesia, colpocleisis, levator plication, perineorrhaphy, cystoscopy, single incision sling  on 11/18/23.  Her symptoms include Pelvic organ prolapse and stress urinary incontinence, and she was was found to have Stage II anterior, Stage I posterior, Stage I apical prolapse.   Urodynamics showed: 1. Sensation was normal; capacity was normal 2. Stress Incontinence was demonstrated at normal pressures; 3. Detrusor Overactivity was demonstrated without leakage. 4. Emptying was dysfunctional with a mildly elevated PVR (120), a sustained detrusor contraction present but prolonged,  abdominal straining present, normal urethral sphincter activity on EMG.  Past Medical History:  Diagnosis Date   Asthma    infrequent problem - no treatment since 2006   Back pain    Choroidal nevus of left eye    sees Dr. Marvis Repress at [facility].    Constipation    Coronary artery disease    Dysrhythmia    SVT - ablation by Dr. Elberta Fortis on 05/21/2016 and episodes since   Fatty liver    GERD (gastroesophageal reflux disease)    History of hiatal hernia    "very small"   Hx of colonic polyp    Hyperlipidemia    Hypertension    Joint pain    Melanoma of eye, left (HCC)    sees Dr. Gwendalyn Ege   Myocardial infarction [facility])    Osteoarthritis    Osteoporosis    osteopenia   Shortness of breath    Supraventricular tachycardia [facility])      Past Surgical History:  Procedure Laterality Date   ABDOMINAL HYSTERECTOMY     ACHILLES TENDON REPAIR  10/07/2011   torn on right, per Dr. Eugenio Hoes SURGERY     torn tendon left ankle   BREAST BIOPSY     CARDIAC CATHETERIZATION N/A 03/14/2016   Procedure: Left Heart Cath and Coronary Angiography;   Surgeon: Peter M Swaziland, MD;  Location: [facility] INVASIVE CV LAB;  Service: Cardiovascular;  Laterality: N/A;   CARDIAC CATHETERIZATION N/A 03/14/2016   Procedure: Coronary Stent Intervention;  Surgeon: Peter M Swaziland, MD;  Location: [facility] INVASIVE CV LAB;  Service: Cardiovascular;  Laterality: N/A;   CARPAL TUNNEL RELEASE     CATARACT EXTRACTION W/ INTRAOCULAR LENS IMPLANT Bilateral    CHOLECYSTECTOMY     COLONOSCOPY  08/14/2021   per Dr. 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 Left 07/02/2019   Procedure: Left Achilles tendon debridement and reconstruction and excision of Haglund deformity;  Surgeon: Toni Arthurs, MD;  Location: [facility];  Service: Orthopedics;  Laterality: Left;   EYE SURGERY Left 08/2016   GASTROCNEMIUS RECESSION Left 07/02/2019   Procedure: Gastroc recession;  Surgeon: Toni Arthurs, MD;  Location:  SURGERY CENTER;  Service: Orthopedics;  Laterality: Left;   IR KYPHO THORACIC WITH BONE BIOPSY  12/18/2018   IR RADIOLOGIST EVAL & MGMT  12/09/2018   IR RADIOLOGIST EVAL & MGMT  04/30/2019   KNEE CLOSED REDUCTION Left 02/03/2014   Procedure: CLOSED MANIPULATION LEFT KNEE;  Surgeon: Loanne Drilling,  MD;  Location: [facility];  Service: Orthopedics;  Laterality: Left;   POLYPECTOMY     REVERSE SHOULDER ARTHROPLASTY     Left   REVERSE SHOULDER ARTHROPLASTY Left 06/28/2017   Procedure: LEFT SHOULDER REVERSE SHOULDER ARTHROPLASTY;  Surgeon: Beverely Low, MD;  Location: [facility] OR;  Service: Orthopedics;  Laterality: Left;   REVERSE SHOULDER ARTHROPLASTY Right 11/03/2021   Procedure: REVERSE SHOULDER ARTHROPLASTY;  Surgeon: Beverely Low, MD;  Location: [facility];  Service: Orthopedics;  Laterality: Right;  with ISB   TOTAL KNEE  ARTHROPLASTY Left 12/21/2013   Procedure: LEFT TOTAL KNEE ARTHROPLASTY;  Surgeon: Loanne Drilling, MD;  Location: [facility];  Service: Orthopedics;  Laterality: Left;   VARICOSE VEIN SURGERY      is allergic to codeine, levofloxacin in d5w, amoxicillin-pot clavulanate, and escitalopram oxalate.   Family History  Problem Relation Age of Onset   Colon polyps Mother    Diabetes Mother    High blood pressure Mother    High Cholesterol Mother    Thyroid disease Mother    Anxiety disorder Mother    Diabetes Father    High blood pressure Father    High Cholesterol Father    Heart disease Father    Depression Father    Obesity Father    Lung cancer Brother        spleen, bone   Colon cancer Maternal Aunt    Liver cancer Maternal Aunt    Breast cancer Other    Coronary artery disease Other    Colon cancer Other    Diabetes Other    Hypertension Other    Kidney disease Other    Lung cancer Other    Esophageal cancer Neg Hx    Stomach cancer Neg Hx    Rectal cancer Neg Hx    Bladder Cancer Neg Hx    Uterine cancer Neg Hx     Social History   Tobacco Use   Smoking status: Never   Smokeless tobacco: Never  Vaping Use   Vaping status: Never Used  Substance Use Topics   Alcohol use: No    Alcohol/week: 0.0 standard drinks of alcohol   Drug use: No     Review of Systems was negative for a full 10 system review except as noted in the History of Present Illness.  No current facility-administered medications for this encounter.  Current Outpatient Medications:    acetaminophen (TYLENOL) 500 MG tablet, Take 1,000 mg by mouth every 6 (six) hours as needed for moderate pain., Disp: , Rfl:    acetaminophen (TYLENOL) 500 MG tablet, Take 1 tablet (500 mg total) by mouth every 6 (six) hours as needed (pain)., Disp: 30 tablet, Rfl: 0   albuterol (VENTOLIN HFA) 108 (90 Base) MCG/ACT inhaler, Inhale 2 puffs into the lungs every 4 (four) hours as needed for wheezing or shortness of  breath., Disp: 18 g, Rfl: 2   alendronate (FOSAMAX) 35 MG tablet, Take 1 tablet (35 mg total) by mouth every 7 (seven) days. Take with a full glass of water on an empty stomach., Disp: 12 tablet, Rfl: 3   aspirin (RA ASPIRIN CHILDRENS) 81 MG chewable tablet, Chew 81 mg by mouth once., Disp: , Rfl:    Calcium Carb-Cholecalciferol 600-800 MG-UNIT TABS, Take 2 tablets by mouth daily., Disp: , Rfl:    cetirizine (ZYRTEC) 10 MG tablet, Take 10 mg by mouth daily. PRN, Disp: , Rfl:    Cholecalciferol (VITAMIN D) 50 MCG (2000 UT) CAPS, Take 1  capsule (2,000 Units total) by mouth daily., Disp: 30 capsule, Rfl: 0   estradiol (ESTRACE) 0.1 MG/GM vaginal cream, Place 0.5 g vaginally 2 (two) times a week. Place 0.5g nightly for two weeks then twice a week after, Disp: 42.5 g, Rfl: 11   ezetimibe (ZETIA) 10 MG tablet, Take 1 tablet (10 mg total) by mouth daily., Disp: 90 tablet, Rfl: 3   ibuprofen (ADVIL) 600 MG tablet, Take 1 tablet (600 mg total) by mouth every 6 (six) hours as needed., Disp: 30 tablet, Rfl: 0   Magnesium 250 MG TABS, Take by mouth., Disp: , Rfl:    metFORMIN (GLUCOPHAGE) 500 MG tablet, Take 1 tablet (500 mg total) by mouth 2 (two) times daily with a meal., Disp: 180 tablet, Rfl: 0   metoprolol succinate (TOPROL-XL) 25 MG 24 hr tablet, Take 1 tablet (25 mg total) by mouth 2 (two) times daily., Disp: 180 tablet, Rfl: 3   Multiple Vitamins-Minerals (MULTIVITAL PO), Take 1 tablet by mouth daily., Disp: , Rfl:    MYRBETRIQ 50 MG TB24 tablet, Take 1 tablet (50 mg total) by mouth daily., Disp: 90 tablet, Rfl: 3   omeprazole (PRILOSEC) 40 MG capsule, TAKE 1 CAPSULE BY MOUTH DAILY, Disp: 90 capsule, Rfl: 3   oxyCODONE (OXY IR/ROXICODONE) 5 MG immediate release tablet, Take 1 tablet (5 mg total) by mouth every 4 (four) hours as needed for severe pain (pain score 7-10)., Disp: 15 tablet, Rfl: 0   Polyethyl Glycol-Propyl Glycol (SYSTANE ULTRA) 0.4-0.3 % SOLN, Apply to eye. PRN, Disp: , Rfl:     polyethylene glycol powder (GLYCOLAX/MIRALAX) 17 GM/SCOOP powder, Take 17 g by mouth daily. Drink 17g (1 scoop) dissolved in water per day., Disp: 255 g, Rfl: 0   ramipril (ALTACE) 10 MG capsule, Take 1 capsule (10 mg total) by mouth daily., Disp: 90 capsule, Rfl: 3   rosuvastatin (CRESTOR) 40 MG tablet, Take 1 tablet (40 mg total) by mouth daily., Disp: 90 tablet, Rfl: 3   scopolamine (TRANSDERM-SCOP) 1 MG/3DAYS, Place 1 patch (1.5 mg total) onto the skin every 3 (three) days., Disp: 10 patch, Rfl: 0   temazepam (RESTORIL) 15 MG capsule, TAKE 1 CAPSULE BY MOUTH EVERY  NIGHT AT BEDTIME AS NEEDED FOR  SLEEP, Disp: 90 capsule, Rfl: 1   Objective There were no vitals filed for this visit.   Gen: NAD CV: S1 S2 RRR Lungs: Clear to auscultation bilaterally Abd: soft, nontender   Previous Pelvic Exam showed: Gen: No apparent distress, A&O x 3.   Detailed Urogynecologic Evaluation:  Normal external genitalia. On speculum, normal vaginal mucosa. On bimanual, no masses present.      POP-Q   1                                            Aa   1                                           Ba   -3.5                                              C  4.5                                            Gh   5.5                                            Pb   6.5                                            tvl    -2.5                                            Ap   -2.5                                            Bp           Assessment/ Plan  Assessment: The patient is a 73 y.o. year old scheduled to undergo Exam under anesthesia, colpocleisis, levator plication, perineorrhaphy, cystoscopy, single incision sling. Verbal consent was obtained for these procedures.

## 2023-11-07 NOTE — Patient Instructions (Signed)
You can consider getting a donut pillow for home use after surgery  You can also consider a peri-bottle to help clean your bottom where the stitches will be after surgery

## 2023-11-07 NOTE — Telephone Encounter (Signed)
I s/w Katlyn at Urology who tells me that Dr.Schroeder has reviewed notes in the chart for the pt and after her review she feels that she will not need cardiac clearance. I have confirmed this myself with Katlyn-surgery scheduler for Dr. Florian Buff. No need for preop clearance.

## 2023-11-07 NOTE — Progress Notes (Signed)
[facility] Pre-Operative Exam  Subjective Chief Complaint: Natalie Hall presents for a preoperative encounter.   History of Present Illness: Natalie Hall is a 73 y.o. female who presents for preoperative visit.  She is scheduled to undergo  Exam under anesthesia, colpocleisis, levator plication, perineorrhaphy, cystoscopy, single incision sling  on 11/18/23.  Her symptoms include Pelvic organ prolapse and stress urinary incontinence, and she was was found to have Stage II anterior, Stage I posterior, Stage I apical prolapse.   Urodynamics showed: 1. Sensation was normal; capacity was normal 2. Stress Incontinence was demonstrated at normal pressures; 3. Detrusor Overactivity was demonstrated without leakage. 4. Emptying was dysfunctional with a mildly elevated PVR (120), a sustained detrusor contraction present but prolonged,  abdominal straining present, normal urethral sphincter activity on EMG.  Past Medical History:  Diagnosis Date   Asthma    infrequent problem - no treatment since 2006   Back pain    Choroidal nevus of left eye    sees Dr. Marvis Repress at [facility].    Constipation    Coronary artery disease    Dysrhythmia    SVT - ablation by Dr. Elberta Fortis on 05/21/2016 and episodes since   Fatty liver    GERD (gastroesophageal reflux disease)    History of hiatal hernia    "very small"   Hx of colonic polyp    Hyperlipidemia    Hypertension    Joint pain    Melanoma of eye, left (HCC)    sees Dr. Gwendalyn Ege   Myocardial infarction [facility])    Osteoarthritis    Osteoporosis    osteopenia   Shortness of breath    Supraventricular tachycardia [facility])      Past Surgical History:  Procedure Laterality Date   ABDOMINAL HYSTERECTOMY     ACHILLES TENDON REPAIR  10/07/2011   torn on right, per Dr. Eugenio Hoes SURGERY     torn tendon left ankle   BREAST BIOPSY     CARDIAC CATHETERIZATION N/A 03/14/2016   Procedure: Left Heart Cath and Coronary  Angiography;  Surgeon: Peter M Swaziland, MD;  Location: [facility] INVASIVE CV LAB;  Service: Cardiovascular;  Laterality: N/A;   CARDIAC CATHETERIZATION N/A 03/14/2016   Procedure: Coronary Stent Intervention;  Surgeon: Peter M Swaziland, MD;  Location: [facility] INVASIVE CV LAB;  Service: Cardiovascular;  Laterality: N/A;   CARPAL TUNNEL RELEASE     CATARACT EXTRACTION W/ INTRAOCULAR LENS IMPLANT Bilateral    CHOLECYSTECTOMY     COLONOSCOPY  08/14/2021   per Dr. 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 Left 07/02/2019   Procedure: Left Achilles tendon debridement and reconstruction and excision of Haglund deformity;  Surgeon: Toni Arthurs, MD;  Location: [facility];  Service: Orthopedics;  Laterality: Left;   EYE SURGERY Left 08/2016   GASTROCNEMIUS RECESSION Left 07/02/2019   Procedure: Gastroc recession;  Surgeon: Toni Arthurs, MD;  Location: [facility];  Service: Orthopedics;  Laterality: Left;   IR KYPHO THORACIC WITH BONE BIOPSY  12/18/2018   IR RADIOLOGIST EVAL & MGMT  12/09/2018   IR RADIOLOGIST EVAL & MGMT  04/30/2019   KNEE CLOSED REDUCTION Left 02/03/2014   Procedure: CLOSED MANIPULATION LEFT KNEE;  Surgeon: Gus Rankin  Aluisio, MD;  Location: [facility];  Service: Orthopedics;  Laterality: Left;   POLYPECTOMY     REVERSE SHOULDER ARTHROPLASTY     Left   REVERSE SHOULDER ARTHROPLASTY Left 06/28/2017   Procedure: LEFT SHOULDER REVERSE SHOULDER ARTHROPLASTY;  Surgeon: Beverely Low, MD;  Location[facility] OR;  Service: Orthopedics;  Laterality: Left;   REVERSE SHOULDER ARTHROPLASTY Right 11/03/2021   Procedure: REVERSE SHOULDER ARTHROPLASTY;  Surgeon: Beverely Low, MD;  Location: [facility];  Service: Orthopedics;  Laterality: Right;  with ISB    TOTAL KNEE ARTHROPLASTY Left 12/21/2013   Procedure: LEFT TOTAL KNEE ARTHROPLASTY;  Surgeon: Loanne Drilling, MD;  Location: [facility];  Service: Orthopedics;  Laterality: Left;   VARICOSE VEIN SURGERY      is allergic to codeine, levofloxacin in d5w, amoxicillin-pot clavulanate, and escitalopram oxalate.   Family History  Problem Relation Age of Onset   Colon polyps Mother    Diabetes Mother    High blood pressure Mother    High Cholesterol Mother    Thyroid disease Mother    Anxiety disorder Mother    Diabetes Father    High blood pressure Father    High Cholesterol Father    Heart disease Father    Depression Father    Obesity Father    Lung cancer Brother        spleen, bone   Colon cancer Maternal Aunt    Liver cancer Maternal Aunt    Breast cancer Other    Coronary artery disease Other    Colon cancer Other    Diabetes Other    Hypertension Other    Kidney disease Other    Lung cancer Other    Esophageal cancer Neg Hx    Stomach cancer Neg Hx    Rectal cancer Neg Hx    Bladder Cancer Neg Hx    Uterine cancer Neg Hx     Social History   Tobacco Use   Smoking status: Never   Smokeless tobacco: Never  Vaping Use   Vaping status: Never Used  Substance Use Topics   Alcohol use: No    Alcohol/week: 0.0 standard drinks of alcohol   Drug use: No     Review of Systems was negative for a full 10 system review except as noted in the History of Present Illness.   Current Outpatient Medications:    acetaminophen (TYLENOL) 500 MG tablet, Take 1,000 mg by mouth every 6 (six) hours as needed for moderate pain., Disp: , Rfl:    acetaminophen (TYLENOL) 500 MG tablet, Take 1 tablet (500 mg total) by mouth every 6 (six) hours as needed (pain)., Disp: 30 tablet, Rfl: 0   albuterol (VENTOLIN HFA) 108 (90 Base) MCG/ACT inhaler, Inhale 2 puffs into the lungs every 4 (four) hours as needed for wheezing or shortness of breath., Disp: 18 g, Rfl: 2   alendronate (FOSAMAX) 35 MG  tablet, Take 1 tablet (35 mg total) by mouth every 7 (seven) days. Take with a full glass of water on an empty stomach., Disp: 12 tablet, Rfl: 3   aspirin (RA ASPIRIN CHILDRENS) 81 MG chewable tablet, Chew 81 mg by mouth once., Disp: , Rfl:    Calcium Carb-Cholecalciferol 600-800 MG-UNIT TABS, Take 2 tablets by mouth daily., Disp: , Rfl:    cetirizine (ZYRTEC) 10 MG tablet, Take 10 mg by mouth daily. PRN, Disp: , Rfl:    Cholecalciferol (VITAMIN D) 50 MCG (2000 UT) CAPS, Take 1 capsule (2,000 Units total) by mouth  daily., Disp: 30 capsule, Rfl: 0   estradiol (ESTRACE) 0.1 MG/GM vaginal cream, Place 0.5 g vaginally 2 (two) times a week. Place 0.5g nightly for two weeks then twice a week after, Disp: 42.5 g, Rfl: 11   ezetimibe (ZETIA) 10 MG tablet, Take 1 tablet (10 mg total) by mouth daily., Disp: 90 tablet, Rfl: 3   ibuprofen (ADVIL) 600 MG tablet, Take 1 tablet (600 mg total) by mouth every 6 (six) hours as needed., Disp: 30 tablet, Rfl: 0   Magnesium 250 MG TABS, Take by mouth., Disp: , Rfl:    metFORMIN (GLUCOPHAGE) 500 MG tablet, Take 1 tablet (500 mg total) by mouth 2 (two) times daily with a meal., Disp: 180 tablet, Rfl: 0   metoprolol succinate (TOPROL-XL) 25 MG 24 hr tablet, Take 1 tablet (25 mg total) by mouth 2 (two) times daily., Disp: 180 tablet, Rfl: 3   Multiple Vitamins-Minerals (MULTIVITAL PO), Take 1 tablet by mouth daily., Disp: , Rfl:    MYRBETRIQ 50 MG TB24 tablet, Take 1 tablet (50 mg total) by mouth daily., Disp: 90 tablet, Rfl: 3   omeprazole (PRILOSEC) 40 MG capsule, TAKE 1 CAPSULE BY MOUTH DAILY, Disp: 90 capsule, Rfl: 3   oxyCODONE (OXY IR/ROXICODONE) 5 MG immediate release tablet, Take 1 tablet (5 mg total) by mouth every 4 (four) hours as needed for severe pain (pain score 7-10)., Disp: 15 tablet, Rfl: 0   Polyethyl Glycol-Propyl Glycol (SYSTANE ULTRA) 0.4-0.3 % SOLN, Apply to eye. PRN, Disp: , Rfl:    polyethylene glycol powder (GLYCOLAX/MIRALAX) 17 GM/SCOOP powder, Take  17 g by mouth daily. Drink 17g (1 scoop) dissolved in water per day., Disp: 255 g, Rfl: 0   ramipril (ALTACE) 10 MG capsule, Take 1 capsule (10 mg total) by mouth daily., Disp: 90 capsule, Rfl: 3   rosuvastatin (CRESTOR) 40 MG tablet, Take 1 tablet (40 mg total) by mouth daily., Disp: 90 tablet, Rfl: 3   scopolamine (TRANSDERM-SCOP) 1 MG/3DAYS, Place 1 patch (1.5 mg total) onto the skin every 3 (three) days., Disp: 10 patch, Rfl: 0   temazepam (RESTORIL) 15 MG capsule, TAKE 1 CAPSULE BY MOUTH EVERY  NIGHT AT BEDTIME AS NEEDED FOR  SLEEP, Disp: 90 capsule, Rfl: 1   Objective Vitals:   11/07/23 1113  BP: 115/76  Pulse: 80    Gen: NAD CV: S1 S2 RRR Lungs: Clear to auscultation bilaterally Abd: soft, nontender   Previous Pelvic Exam showed: Gen: No apparent distress, A&O x 3.   Detailed Urogynecologic Evaluation:  Normal external genitalia. On speculum, normal vaginal mucosa. On bimanual, no masses present.      POP-Q   1                                            Aa   1                                           Ba   -3.5                                              C    4.5  Gh   5.5                                            Pb   6.5                                            tvl    -2.5                                            Ap   -2.5                                            Bp           Assessment/ Plan  Assessment: The patient is a 73 y.o. year old scheduled to undergo Exam under anesthesia, colpocleisis, levator plication, perineorrhaphy, cystoscopy, single incision sling. Verbal consent was obtained for these procedures.  Plan: General Surgical Consent: The patient has previously been counseled on alternative treatments, and the decision by the patient and provider was to proceed with the procedure listed above.  For all procedures, there are risks of bleeding, infection, damage to surrounding organs  including but not limited to bowel, bladder, blood vessels, ureters and nerves, and need for further surgery if an injury were to occur. These risks are all low with minimally invasive surgery.   There are risks of numbness and weakness at any body site or buttock/rectal pain.  It is possible that baseline pain can be worsened by surgery, either with or without mesh. If surgery is vaginal, there is also a low risk of possible conversion to laparoscopy or open abdominal incision where indicated. Very rare risks include blood transfusion, blood clot, heart attack, pneumonia, or death.   There is also a risk of short-term postoperative urinary retention with need to use a catheter. About half of patients need to go home from surgery with a catheter, which is then later removed in the office. The risk of long-term need for a catheter is very low. There is also a risk of worsening of overactive bladder.   Sling: The effectiveness of a midurethral vaginal mesh sling is approximately 85%, and thus, there will be times when you may leak urine after surgery, especially if your bladder is full or if you have a strong cough. There is a balance between making the sling tight enough to treat your leakage but not too tight so that you have long-term difficulty emptying your bladder. A mesh sling will not directly treat overactive bladder/urge incontinence and may worsen it.  There is an FDA safety notification on vaginal mesh procedures for prolapse but NOT mesh slings. We have extensive experience and training with mesh placement and we have close postoperative follow up to identify any potential complications from mesh. It is important to realize that this mesh is a permanent implant that cannot be easily removed. There are rare risks of mesh exposure (2-4%), pain with intercourse (0-7%), and infection (<1%). The risk of mesh exposure if more likely in a  woman with risks for poor healing (prior radiation, poorly  controlled diabetes, or immunocompromised). The risk of new or worsened chronic pain after mesh implant is more common in women with baseline chronic pain and/or poorly controlled anxiety or depression. Approximately 2-4% of patients will experience longer-term post-operative voiding dysfunction that may require surgical revision of the sling. We also reviewed that postoperatively, her stream may not be as strong as before surgery.   Prolapse (with or without mesh): Risk factors for surgical failure  include things that put pressure on your pelvis and the surgical repair, including obesity, chronic cough, and heavy lifting or straining (including lifting children or adults, straining on the toilet, or lifting heavy objects such as furniture or anything weighing >25 lbs. Risks of recurrence is 20-30% with vaginal native tissue repair and a less than 10% with sacrocolpopexy with mesh.      We discussed consent for blood products. Risks for blood transfusion include allergic reactions, other reactions that can affect different body organs and managed accordingly, transmission of infectious diseases such as HIV or Hepatitis. However, the blood is screened. Patient consents for blood products.  Pre-operative instructions:  She was instructed to not take Aspirin/NSAIDs x 7days prior to surgery.  Antibiotic prophylaxis was ordered as indicated.  Catheter use: Patient will go home with foley if needed after post-operative voiding trial.  Post-operative instructions:  She was provided with specific post-operative instructions, including precautions and signs/symptoms for which we would recommend contacting us, in addition to daytime and after-hours contact phone numbers. This was provided on a handout.   Post-operative medications: Prescriptions for motrin, tylenol, miralax, and oxycodone were sent to her pharmacy. Discussed using ibuprofen and tylenol on a schedule to limit use of narcotics.   Laboratory  testing:  We will check labs: As requested by anesthesia  Preoperative clearance:  She does not require surgical clearance.  No cardiac events since 2017  Caprini Score:6   Post-operative follow-up:  A post-operative appointment will be made for 6 weeks from the date of surgery. If she needs a post-operative nurse visit for a voiding trial, that will be set up after she leaves the hospital.    Patient will call the clinic or use MyChart should anything change or any new issues arise.   Selmer Dominion, NP

## 2023-11-15 ENCOUNTER — Encounter (HOSPITAL_BASED_OUTPATIENT_CLINIC_OR_DEPARTMENT_OTHER): Payer: Self-pay | Admitting: Obstetrics and Gynecology

## 2023-11-15 NOTE — Progress Notes (Signed)
Spoke w/ via phone for pre-op interview--- pt Lab needs dos----  [place] results------ current EKG in epic/ chart COVID test -----patient states asymptomatic no test needed Arrive at -------  0530 on 11-18-2023 NPO after MN NO Solid Food.  Clear liquids from MN until--- 0430 Med rec completed Medications to take morning of surgery ----- zetia, crestor, toprol, prilosec, myrbtriq Diabetic medication ----- do not take ramipril Patient instructed no nail polish to be worn day of surgery Patient instructed to bring photo id and insurance card day of surgery Patient aware to have Driver (ride ) / caregiver    for 24 hours after surgery - husband, ernest Patient Special Instructions ----- n/a Pre-Op special Instructions -----  pt was given instructions by office to stop ASA prior to surgery,  stated last dose 11-11-2023. Patient verbalized understanding of instructions that were given at this phone interview. Patient denies chest pain, sob, fever, cough at the interview.   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 [facility] 04-29-2023) Cardiologist :  Dr Rennis [facility] 12-19-2022, last next appt 01-10-2024) Chest x-ray : 04-29-2023 EKG : 12-19-2022 Echo : 03-12-2016 Stress test: nuclear 03-13-2016 Cardiac Cath :  03-14-2016 Activity level: Cierrah Dace sob w/ any activity Sleep Study/ CPAP :  no Blood Thinner/ Instructions /Last Dose:  no ASA / Instructions/ Last Dose : ASA 81mg 

## 2023-11-17 NOTE — Anesthesia Preprocedure Evaluation (Addendum)
Anesthesia Evaluation  Patient identified by MRN, date of birth, ID band Patient awake    Reviewed: Allergy & Precautions, NPO status , Patient's Chart, lab work & pertinent test results  Airway Mallampati: II  TM Distance: >3 FB Neck ROM: Full    Dental no notable dental hx.    Pulmonary asthma    Pulmonary exam normal        Cardiovascular hypertension, Pt. on medications and Pt. on home beta blockers + CAD, + Past MI and + Cardiac Stents   Rhythm:Regular Rate:Normal     Neuro/Psych    Depression       GI/Hepatic Neg liver ROS, hiatal hernia,GERD  ,,  Endo/Other    Renal/GU negative Renal ROS  Female GU complaint     Musculoskeletal  (+) Arthritis , Osteoarthritis,    Abdominal Normal abdominal exam  (+)   Peds  Hematology  (+) Blood dyscrasia, anemia Lab Results      Component                Value               Date                      WBC                      4.3                 04/29/2023                HGB                      12.8                04/29/2023                HCT                      39.7                04/29/2023                MCV                      87.3                04/29/2023                PLT                      177.0               04/29/2023              Anesthesia Other Findings   Reproductive/Obstetrics                             Anesthesia Physical Anesthesia Plan  ASA: 3  Anesthesia Plan: General   Post-op Pain Management: Celebrex PO (pre-op)* and Tylenol PO (pre-op)*   Induction: Intravenous  PONV Risk Score and Plan: 3 and Ondansetron, Dexamethasone and Treatment may vary due to age or medical condition  Airway Management Planned: Mask and LMA  Additional Equipment: None  Intra-op Plan:   Post-operative Plan: Extubation in OR  Informed Consent: I have reviewed the patients History and Physical,  chart, labs and discussed the  procedure including the risks, benefits and alternatives for the proposed anesthesia with the patient or authorized representative who has indicated his/her understanding and acceptance.     Dental advisory given  Plan Discussed with: CRNA  Anesthesia Plan Comments:        Anesthesia Quick Evaluation

## 2023-11-18 ENCOUNTER — Encounter (HOSPITAL_BASED_OUTPATIENT_CLINIC_OR_DEPARTMENT_OTHER)
Admission: RE | Disposition: A | Discharge status: Home / Self Care | Payer: Self-pay | Source: Home / Self Care | Attending: Obstetrics and Gynecology

## 2023-11-18 ENCOUNTER — Ambulatory Visit (HOSPITAL_BASED_OUTPATIENT_CLINIC_OR_DEPARTMENT_OTHER): Payer: Medicare Other | Admitting: Anesthesiology

## 2023-11-18 ENCOUNTER — Ambulatory Visit (HOSPITAL_BASED_OUTPATIENT_CLINIC_OR_DEPARTMENT_OTHER)
Admission: RE | Admit: 2023-11-18 | Discharge: 2023-11-18 | Disposition: A | Discharge status: Home / Self Care | Payer: Medicare Other | Attending: Obstetrics and Gynecology | Admitting: Obstetrics and Gynecology

## 2023-11-18 ENCOUNTER — Other Ambulatory Visit: Payer: Self-pay

## 2023-11-18 ENCOUNTER — Encounter (HOSPITAL_BASED_OUTPATIENT_CLINIC_OR_DEPARTMENT_OTHER): Payer: Self-pay | Admitting: Obstetrics and Gynecology

## 2023-11-18 DIAGNOSIS — N393 Stress incontinence (female) (male): Secondary | ICD-10-CM

## 2023-11-18 DIAGNOSIS — I252 Old myocardial infarction: Secondary | ICD-10-CM | POA: Insufficient documentation

## 2023-11-18 DIAGNOSIS — I10 Essential (primary) hypertension: Secondary | ICD-10-CM | POA: Diagnosis not present

## 2023-11-18 DIAGNOSIS — I2510 Atherosclerotic heart disease of native coronary artery without angina pectoris: Secondary | ICD-10-CM | POA: Diagnosis not present

## 2023-11-18 DIAGNOSIS — N993 Prolapse of vaginal vault after hysterectomy: Secondary | ICD-10-CM | POA: Diagnosis present

## 2023-11-18 DIAGNOSIS — K219 Gastro-esophageal reflux disease without esophagitis: Secondary | ICD-10-CM | POA: Diagnosis not present

## 2023-11-18 DIAGNOSIS — J45909 Unspecified asthma, uncomplicated: Secondary | ICD-10-CM

## 2023-11-18 DIAGNOSIS — N3080 Other cystitis without hematuria: Secondary | ICD-10-CM | POA: Diagnosis not present

## 2023-11-18 DIAGNOSIS — K449 Diaphragmatic hernia without obstruction or gangrene: Secondary | ICD-10-CM | POA: Diagnosis not present

## 2023-11-18 DIAGNOSIS — N3289 Other specified disorders of bladder: Secondary | ICD-10-CM | POA: Diagnosis not present

## 2023-11-18 DIAGNOSIS — Z01818 Encounter for other preprocedural examination: Secondary | ICD-10-CM

## 2023-11-18 DIAGNOSIS — Z955 Presence of coronary angioplasty implant and graft: Secondary | ICD-10-CM | POA: Insufficient documentation

## 2023-11-18 DIAGNOSIS — Z79899 Other long term (current) drug therapy: Secondary | ICD-10-CM | POA: Insufficient documentation

## 2023-11-18 HISTORY — DX: Other specified disorders of bone density and structure, unspecified site: M85.80

## 2023-11-18 HISTORY — DX: Overactive bladder: N32.81

## 2023-11-18 HISTORY — DX: Personal history of adenomatous and serrated colon polyps: Z86.0101

## 2023-11-18 HISTORY — PX: COLPOCLEISIS: SHX6814

## 2023-11-18 HISTORY — DX: Stress incontinence (female) (male): N39.3

## 2023-11-18 HISTORY — DX: Diaphragmatic hernia without obstruction or gangrene: K44.9

## 2023-11-18 HISTORY — DX: Prolapse of vaginal vault after hysterectomy: N99.3

## 2023-11-18 HISTORY — PX: CYSTOSCOPY: SHX5120

## 2023-11-18 HISTORY — PX: BIOPSY: SHX5522

## 2023-11-18 HISTORY — PX: RECTOCELE REPAIR: SHX761

## 2023-11-18 HISTORY — DX: Other constipation: K59.09

## 2023-11-18 HISTORY — DX: Supraventricular tachycardia, unspecified: I47.10

## 2023-11-18 HISTORY — DX: Other chronic pain: G89.29

## 2023-11-18 HISTORY — PX: BLADDER SUSPENSION: SHX72

## 2023-11-18 HISTORY — DX: Mixed hyperlipidemia: E78.2

## 2023-11-18 LAB — POCT I-STAT, CHEM 8
BUN: 18 mg/dL (ref 8–23)
Calcium, Ion: 1.22 mmol/L (ref 1.15–1.40)
Chloride: 98 mmol/L (ref 98–111)
Creatinine, Ser: 0.7 mg/dL (ref 0.44–1.00)
Glucose, Bld: 94 mg/dL (ref 70–99)
HCT: 37 % (ref 36.0–46.0)
Hemoglobin: 12.6 g/dL (ref 12.0–15.0)
Potassium: 3.9 mmol/L (ref 3.5–5.1)
Sodium: 134 mmol/L — ABNORMAL LOW (ref 135–145)
TCO2: 25 mmol/L (ref 22–32)

## 2023-11-18 SURGERY — COLPOCLEISIS
Anesthesia: General | Site: Vagina

## 2023-11-18 MED ORDER — METRONIDAZOLE 500 MG/100ML IV SOLN
500.0000 mg | INTRAVENOUS | Status: AC
  Administered 2023-11-18: 500 mg via INTRAVENOUS

## 2023-11-18 MED ORDER — PROPOFOL 10 MG/ML IV BOLUS
INTRAVENOUS | Status: AC
  Filled 2023-11-18: qty 20

## 2023-11-18 MED ORDER — LACTATED RINGERS IV SOLN: INTRAVENOUS | Status: DC

## 2023-11-18 MED ORDER — ACETAMINOPHEN 500 MG PO TABS
ORAL_TABLET | ORAL | Status: AC
  Filled 2023-11-18: qty 2

## 2023-11-18 MED ORDER — OXYCODONE HCL 5 MG/5ML PO SOLN: 5.0000 mg | Freq: Once | ORAL | Status: DC | PRN

## 2023-11-18 MED ORDER — EPHEDRINE 5 MG/ML INJ
INTRAVENOUS | Status: AC
  Filled 2023-11-18: qty 5

## 2023-11-18 MED ORDER — GABAPENTIN 300 MG PO CAPS
ORAL_CAPSULE | ORAL | Status: AC
  Filled 2023-11-18: qty 1

## 2023-11-18 MED ORDER — CELECOXIB 200 MG PO CAPS
ORAL_CAPSULE | ORAL | Status: AC
  Filled 2023-11-18: qty 1

## 2023-11-18 MED ORDER — EPHEDRINE SULFATE (PRESSORS) 50 MG/ML IJ SOLN
INTRAMUSCULAR | Status: DC | PRN
  Administered 2023-11-18 (×2): 10 mg via INTRAVENOUS

## 2023-11-18 MED ORDER — CELECOXIB 200 MG PO CAPS
200.0000 mg | ORAL_CAPSULE | Freq: Once | ORAL | Status: AC
  Administered 2023-11-18: 200 mg via ORAL

## 2023-11-18 MED ORDER — FENTANYL CITRATE (PF) 100 MCG/2ML IJ SOLN
INTRAMUSCULAR | Status: AC
  Filled 2023-11-18: qty 2

## 2023-11-18 MED ORDER — ONDANSETRON HCL 4 MG/2ML IJ SOLN
INTRAMUSCULAR | Status: DC | PRN
  Administered 2023-11-18: 4 mg via INTRAVENOUS

## 2023-11-18 MED ORDER — PHENAZOPYRIDINE HCL 100 MG PO TABS
200.0000 mg | ORAL_TABLET | ORAL | Status: AC
  Administered 2023-11-18: 200 mg via ORAL

## 2023-11-18 MED ORDER — FENTANYL CITRATE (PF) 100 MCG/2ML IJ SOLN
INTRAMUSCULAR | Status: DC | PRN
  Administered 2023-11-18 (×4): 50 ug via INTRAVENOUS

## 2023-11-18 MED ORDER — PHENAZOPYRIDINE HCL 100 MG PO TABS
ORAL_TABLET | ORAL | Status: AC
  Filled 2023-11-18: qty 2

## 2023-11-18 MED ORDER — ONDANSETRON HCL 4 MG/2ML IJ SOLN
INTRAMUSCULAR | Status: AC
  Filled 2023-11-18: qty 2

## 2023-11-18 MED ORDER — OXYCODONE HCL 5 MG PO TABS: 5.0000 mg | ORAL_TABLET | Freq: Once | ORAL | Status: DC | PRN

## 2023-11-18 MED ORDER — SODIUM CHLORIDE 0.9 % IR SOLN
Status: DC | PRN
  Administered 2023-11-18: 1 via INTRAVESICAL

## 2023-11-18 MED ORDER — SODIUM CHLORIDE (PF) 0.9 % IJ SOLN
INTRAMUSCULAR | Status: DC | PRN
  Administered 2023-11-18: 20 mL via INTRAVENOUS

## 2023-11-18 MED ORDER — FENTANYL CITRATE (PF) 100 MCG/2ML IJ SOLN: 25.0000 ug | INTRAMUSCULAR | Status: DC | PRN

## 2023-11-18 MED ORDER — LIDOCAINE-EPINEPHRINE 1 %-1:100000 IJ SOLN
INTRAMUSCULAR | Status: DC | PRN
  Administered 2023-11-18: 20 mL

## 2023-11-18 MED ORDER — GABAPENTIN 300 MG PO CAPS
300.0000 mg | ORAL_CAPSULE | ORAL | Status: AC
  Administered 2023-11-18: 300 mg via ORAL

## 2023-11-18 MED ORDER — METRONIDAZOLE 500 MG/100ML IV SOLN
INTRAVENOUS | Status: AC
  Filled 2023-11-18: qty 100

## 2023-11-18 MED ORDER — LIDOCAINE 2% (20 MG/ML) 5 ML SYRINGE
INTRAMUSCULAR | Status: DC | PRN
  Administered 2023-11-18: 60 mg via INTRAVENOUS

## 2023-11-18 MED ORDER — ENOXAPARIN SODIUM 40 MG/0.4ML IJ SOSY
40.0000 mg | PREFILLED_SYRINGE | INTRAMUSCULAR | Status: AC
  Administered 2023-11-18: 40 mg via SUBCUTANEOUS

## 2023-11-18 MED ORDER — DEXAMETHASONE SODIUM PHOSPHATE 4 MG/ML IJ SOLN
INTRAMUSCULAR | Status: DC | PRN
  Administered 2023-11-18: 5 mg via INTRAVENOUS

## 2023-11-18 MED ORDER — ACETAMINOPHEN 500 MG PO TABS
1000.0000 mg | ORAL_TABLET | ORAL | Status: AC
  Administered 2023-11-18: 1000 mg via ORAL

## 2023-11-18 MED ORDER — LIDOCAINE HCL (PF) 2 % IJ SOLN
INTRAMUSCULAR | Status: AC
  Filled 2023-11-18: qty 5

## 2023-11-18 MED ORDER — ENOXAPARIN SODIUM 40 MG/0.4ML IJ SOSY
PREFILLED_SYRINGE | INTRAMUSCULAR | Status: AC
  Filled 2023-11-18: qty 0.4

## 2023-11-18 MED ORDER — PROPOFOL 10 MG/ML IV BOLUS
INTRAVENOUS | Status: DC | PRN
  Administered 2023-11-18: 150 mg via INTRAVENOUS
  Administered 2023-11-18 (×2): 50 mg via INTRAVENOUS

## 2023-11-18 MED ORDER — GENTAMICIN SULFATE 40 MG/ML IJ SOLN
5.0000 mg/kg | INTRAVENOUS | Status: AC
  Administered 2023-11-18: 340 mg via INTRAVENOUS
  Filled 2023-11-18: qty 8.5

## 2023-11-18 SURGICAL SUPPLY — 34 items
BLADE CLIPPER SENSICLIP SURGIC (BLADE) ×3 IMPLANT
BLADE SURG 15 STRL LF DISP TIS (BLADE) ×3 IMPLANT
DERMABOND ADVANCED .7 DNX12 (GAUZE/BANDAGES/DRESSINGS) ×3 IMPLANT
ELECT REM PT RETURN 9FT ADLT (ELECTROSURGICAL) ×3
ELECTRODE REM PT RTRN 9FT ADLT (ELECTROSURGICAL) IMPLANT
GLOVE BIOGEL PI IND STRL 6.5 (GLOVE) ×3 IMPLANT
GLOVE BIOGEL PI IND STRL 7.0 (GLOVE) ×3 IMPLANT
GLOVE ECLIPSE 6.0 STRL STRAW (GLOVE) ×3 IMPLANT
GLOVE SURG SS PI 7.5 STRL IVOR (GLOVE) IMPLANT
GOWN STRL REUS W/TWL LRG LVL3 (GOWN DISPOSABLE) ×3 IMPLANT
HOLDER FOLEY CATH W/STRAP (MISCELLANEOUS) ×3 IMPLANT
KIT TURNOVER CYSTO (KITS) ×3 IMPLANT
MANIFOLD NEPTUNE II (INSTRUMENTS) ×3 IMPLANT
NDL HYPO 22X1.5 SAFETY MO (MISCELLANEOUS) ×3 IMPLANT
NEEDLE HYPO 22X1.5 SAFETY MO (MISCELLANEOUS) ×3
NS IRRIG 1000ML POUR BTL (IV SOLUTION) ×3 IMPLANT
PACK VAGINAL WOMENS (CUSTOM PROCEDURE TRAY) ×3 IMPLANT
PAD OB MATERNITY 4.3X12.25 (PERSONAL CARE ITEMS) ×3 IMPLANT
RETRACTOR LONE STAR DISPOSABLE (INSTRUMENTS) ×3 IMPLANT
RETRACTOR STAY HOOK 5MM (MISCELLANEOUS) ×3 IMPLANT
SCRUB CHG 4% DYNA-HEX 4OZ (MISCELLANEOUS) ×3 IMPLANT
SET IRRIG Y TYPE TUR BLADDER L (SET/KITS/TRAYS/PACK) ×3 IMPLANT
SLEEVE SCD COMPRESS KNEE MED (STOCKING) ×3 IMPLANT
SLING ALTIS SYSTEM (Sling) IMPLANT
SUCTION TUBE FRAZIER 10FR DISP (SUCTIONS) ×3 IMPLANT
SURGIFLO W/THROMBIN 8M KIT (HEMOSTASIS) IMPLANT
SUT MON AB 2-0 SH 27 (SUTURE) IMPLANT
SUT VIC AB 0 CT1 27XCR 8 STRN (SUTURE) ×3 IMPLANT
SUT VIC AB 0 CT2 27 (SUTURE) IMPLANT
SUT VIC AB 2-0 SH 27XBRD (SUTURE) ×3 IMPLANT
SUT VICRYL 2-0 SH 8X27 (SUTURE) ×3 IMPLANT
SYR BULB EAR ULCER 3OZ GRN STR (SYRINGE) ×3 IMPLANT
TOWEL OR 17X24 6PK STRL BLUE (TOWEL DISPOSABLE) ×3 IMPLANT
TRAY FOLEY W/BAG SLVR 14FR LF (SET/KITS/TRAYS/PACK) ×3 IMPLANT

## 2023-11-18 NOTE — Op Note (Addendum)
Operative Note  Preoperative Diagnosis: anterior vaginal prolapse, posterior vaginal prolapse, vaginal vault prolapse after hysterectomy, and stress urinary incontinence  Postoperative Diagnosis: same  Procedures performed:   Colpocleisis, levator plication, perineorrhaphy, cystoscopy, bladder biopsy, single incision sling (Altis)  Implants:  Implant Name Type Inv. Item Serial No. Manufacturer Lot No. LRB No. Used Action  SLING ALTIS SYSTEM - [id number] Sling SLING ALTIS SYSTEM  COLOPLAST [id number] N/A 1 [facility] ALTIS SYSTEM - [id number] Sling SLING ALTIS SYSTEM  COLOPLAST [id number] N/A 1 Implanted    Attending Surgeon: Lanetta Inch, MD  Anesthesia: General endotracheal  Findings: 1. On vaginal exam, stage III assymmetrical prolapse present  2. On cystoscopy, polypoid lesion at the bladder neck at 7 o'clock, other mucosa appears normal. Brisk bilateral ureteral efflux present.     Specimens:  ID Type Source Tests Collected by Time Destination  1 : Bladder biopsy Tissue PATH GU biopsy SURGICAL PATHOLOGY Marguerita Beards, MD 11/18/2023 0932     Estimated blood loss: 50 mL  IV fluids: 700 mL  Urine output: see flowsheet  Complications: When placing the right side of the sling, after the anchor was placed, the sling detached from the anchor. The anchor was left in place and a different sling was then used.   Procedure in Detail:  After informed consent was obtained, the patient was taken to the operating room where she was placed under anesthesia.  She was then placed in the dorsal lithotomy position, taking care to avoid traction on the extremities. She was then prepped and draped in the usual sterile fashion.  A self-retaining lonestar retractor was placed using four elastic blue stays.  After a foley catheter was inserted into the urethra, the location of the midurethra was palpated. Two Allis clamps were placed at the vaginal apex. 0.5% lidocaine with epinephrine was  injected into the vaginal mucosa circumferentially.  A marking pen was used to delineate anterior and posterior rectangles of vaginal mucosa to remove with remaining lateral tunnels.  An incision was made with a 15 blade scalpel along these marked lines. After an Allis clamp was placed on the vaginal mucosa, the underlying vaginal muscularis was dissected off the mucosa.  The vaginal mucosa was excised off each anterior and posterior section. Excellent hemostasis was obtained with cautery. The mucosa on the lateral sides was closed into a tunnel with a 2-0 vicryl suture.  Serial pursestring sutures were used to imbricate and the cervix and vaginal prolapse using 2-0 vicryl. Anterior and posterior plication sutures were placed to reapproximate the epithelium with 0-vicryl. The mucosa was then closed with a 0-vicryl suture in a running fashion.    The mid urethral area was located on the anterior vaginal [place].  Two Allis clamps were placed at the level of the midurethra. 0.5% lidocaine with epinephrine was injected into the vaginal mucosa. A vertical incision was made between the two clamps using a 15-blade scalpel.  Using sharp dissection, Metzenbaum scissors were used to make a periurethral tunnel from the vaginal incision towards the pubic rami bilaterally for the future sling tracts. The bladder was ensured to be empty. The trocar and attached sling were introduced into the right side of the periurethral vaginal incision, just inferior to the pubic rami. The trocar was guided through the endopelvic fascia with a cephalad drift then toward the obturator membrane in the 10 o'clock position. The trocar was given a quarter turn and the anchor was felt to engage into the obturator membrane. The trocar  was pulled back and although the achor engaged, the sling detached. A new sling was placed in a similar fashion. The trocar was removed and this was repeated on the left side in a similar fashion.   A 70-degree  cystoscope was introduced, and 360-degree inspection revealed no trauma in the bladder, with brisk bilateral ureteral efflux. A frondular polyp was noted a the bladder neck at 7 o'clock. This was removed with a cold cup forceps. The area was cauterized with a bugbee and hemostasis was noted.  The bladder was drained and the cystoscope was removed.  The Foley catheter was reinserted.  The sling was tensioned with the prolene suture, enough to touch the urethral tissue but without tension. The prolene suture was trimmed. No sling perforation was noted in the vaginal sulcus. The vaginal mucosal edges were reapproximated using 2-0 Vicryl.  The vagina was copiously irrigated.  Hemostasis was again noted.    Attention was then turned to the perineum. Two allis clamps were placed at the introitus. The perineum was injected with 0.5% lidocaine with epinephrine. A diamond shaped incision was made over the perineum and excess skin was removed. Dissection was performed with Metzenbaum scissors to separate the mucosa from the underlying tissue. The levator muscles were plicated to the midline with two interrupted 0- vicryl sutures. The perineal body was then reapproximated with two interrupted 0-vicryl sutures. The perineal skin was then closed with a 2-0 vicryl in a subcutaneous and subcuticular fashion. Irrigation was performed and good hemostasis was noted. The patient tolerated the procedure well and was awoken and taken to the recovery room in stable condition. Needle and sponge count was correct x2.   Marguerita Beards, MD

## 2023-11-18 NOTE — Discharge Instructions (Addendum)

## 2023-11-18 NOTE — Anesthesia Postprocedure Evaluation (Signed)
Anesthesia Post Note  Patient: AYRABELLA LABOMBARD  Procedure(s) Performed: COLPOCLEISIS (Vagina ) LEVATOR PLICATION AND PERINEORRHAPHY (Vagina ) TRANSVAGINAL TAPE (TVT) PROCEDURE--Altis Sling (Vagina ) CYSTOSCOPY (Bladder) BLADDER BIOPSY (Bladder)     Patient location during evaluation: PACU Anesthesia Type: General Level of consciousness: awake and alert Pain management: pain level controlled Vital Signs Assessment: post-procedure vital signs reviewed and stable Respiratory status: spontaneous breathing, nonlabored ventilation, respiratory function stable and patient connected to nasal cannula oxygen Cardiovascular status: blood pressure returned to baseline and stable Postop Assessment: no apparent nausea or vomiting Anesthetic complications: no   No notable events documented.  Last Vitals:  Vitals:   11/18/23 1130 11/18/23 1300  BP: (!) 130/57 134/63  Pulse: 74 73  Resp: (!) 9 16  Temp:  (!) 36.3 C  SpO2: 99% 99%    Last Pain:  Vitals:   11/18/23 1115  TempSrc:   PainSc: 0-No pain                 Earl Lites P Anjelo Pullman

## 2023-11-18 NOTE — Transfer of Care (Signed)
Immediate Anesthesia Transfer of Care Note  Patient: Natalie Hall  Procedure(s) Performed: Procedure(s) (LRB): COLPOCLEISIS (N/A) LEVATOR PLICATION AND PERINEORRHAPHY (N/A) TRANSVAGINAL TAPE (TVT) PROCEDURE--Altis Sling (N/A) CYSTOSCOPY (N/A) BLADDER BIOPSY  Patient Location: PACU  Anesthesia Type: GA  Level of Consciousness: awake, sedated, patient cooperative and responds to stimulation, c/o pain in back - comfort measures given w/ medication   Airway & Oxygen Therapy: Patient Spontanous Breathing and Patient connected to [place] oxygen  Post-op Assessment: Report given to PACU RN, Post -op Vital signs reviewed and stable and Patient moving all extremities  Post vital signs: Reviewed and stable  Complications: No apparent anesthesia complications

## 2023-11-18 NOTE — Anesthesia Procedure Notes (Signed)
Procedure Name: LMA Insertion Date/Time: 11/18/2023 7:56 AM  Performed by: Jessica Priest, CRNAPre-anesthesia Checklist: Patient identified, Emergency Drugs available, Suction available, Patient being monitored and Timeout performed Patient Re-evaluated:Patient Re-evaluated prior to induction Oxygen Delivery Method: Circle system utilized Preoxygenation: Pre-oxygenation with 100% oxygen Induction Type: IV induction Ventilation: Mask ventilation without difficulty LMA: LMA with gastric port inserted LMA Size: 4.0 Number of attempts: 2 Airway Equipment and Method: Bite block Placement Confirmation: positive ETCO2, breath sounds checked- equal and bilateral and CO2 detector Tube secured with: Tape Dental Injury: Teeth and Oropharynx as per pre-operative assessment

## 2023-11-18 NOTE — Interval H&P Note (Signed)
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  The various methods of treatment have been discussed with the patient and family. After consideration of risks, benefits and other options for treatment, the patient has consented to  Procedure(s) with comments: COLPOCLEISIS (N/A)  POSTERIOR REPAIR (RECTOCELE) (N/A) TRANSVAGINAL TAPE (TVT) PROCEDURE--Altis Sling (N/A) CYSTOSCOPY (N/A) as a surgical intervention.  The patient's history has been reviewed, patient examined, no change in status, stable for surgery.  I have reviewed the patient's chart and labs.  Questions were answered to the patient's satisfaction.     Marguerita Beards

## 2023-11-19 ENCOUNTER — Telehealth: Payer: Self-pay | Admitting: Obstetrics and Gynecology

## 2023-11-19 LAB — SURGICAL PATHOLOGY

## 2023-11-19 NOTE — Telephone Encounter (Signed)
Natalie Hall underwent  Colpocleisis, levator plication, perineorrhaphy, cystoscopy, bladder biopsy, single incision sling (Altis) on 11/18/23.   Natalie Hall passed her voiding trial.  was backfilled into the bladder Voided  PVR by bladder scan was 0ml.   Natalie Hall was discharged without a catheter. Please call her for a routine post op check. Thanks!  Marguerita Beards, MD

## 2023-11-20 ENCOUNTER — Encounter (HOSPITAL_BASED_OUTPATIENT_CLINIC_OR_DEPARTMENT_OTHER): Payer: Self-pay | Admitting: Obstetrics and Gynecology

## 2023-11-21 ENCOUNTER — Encounter: Payer: Self-pay | Admitting: Obstetrics and Gynecology

## 2023-11-21 NOTE — Telephone Encounter (Signed)
Called and spoke to patient:  She reports she is having significant urgency that is causing leakage.  She is taking Tylenol and ibuprofen. Pain 3/10 for pain and is not taking narcotic.  Patient reports she has not yet had a bowel movement. Encouraged her to take her miralax and she reports she has taken it yesterday and today.  Reports spotting bleeding, but no large clots.   Besides the significant leakage, patient reports she is overall doing well. I encouraged her it can take up to 3 months for the bladder to calm down after surgery but to let us know if her leakage continue to be significant. She reports she will give it a week or so and let us know if it continues to be as bothersome.

## 2023-12-28 ENCOUNTER — Other Ambulatory Visit: Payer: Self-pay | Admitting: Internal Medicine

## 2023-12-28 DIAGNOSIS — I10 Essential (primary) hypertension: Secondary | ICD-10-CM

## 2023-12-28 DIAGNOSIS — E782 Mixed hyperlipidemia: Secondary | ICD-10-CM

## 2023-12-30 ENCOUNTER — Encounter: Payer: Medicare Other | Admitting: Obstetrics and Gynecology

## 2023-12-30 ENCOUNTER — Encounter: Payer: Self-pay | Admitting: Internal Medicine

## 2024-01-01 ENCOUNTER — Other Ambulatory Visit: Payer: Self-pay | Admitting: Specialist

## 2024-01-01 DIAGNOSIS — M5459 Other low back pain: Secondary | ICD-10-CM

## 2024-01-09 ENCOUNTER — Ambulatory Visit (INDEPENDENT_AMBULATORY_CARE_PROVIDER_SITE_OTHER): Payer: Medicare Other | Admitting: Obstetrics and Gynecology

## 2024-01-09 ENCOUNTER — Encounter: Payer: Self-pay | Admitting: Obstetrics and Gynecology

## 2024-01-09 VITALS — BP 156/77 | HR 65

## 2024-01-09 DIAGNOSIS — N952 Postmenopausal atrophic vaginitis: Secondary | ICD-10-CM

## 2024-01-09 DIAGNOSIS — Z48816 Encounter for surgical aftercare following surgery on the genitourinary system: Secondary | ICD-10-CM

## 2024-01-09 DIAGNOSIS — Z9889 Other specified postprocedural states: Secondary | ICD-10-CM

## 2024-01-09 MED ORDER — ESTRADIOL 0.1 MG/GM VA CREA: TOPICAL_CREAM | VAGINAL | 11 refills | Status: AC

## 2024-01-09 NOTE — Progress Notes (Signed)
 [facility]  Date of Visit: 01/09/2024  History of Present Illness: [name] is a 73 y.o. female scheduled today for a post-operative visit.   Surgery: s/p  Colpocleisis, levator plication, perineorrhaphy, cystoscopy, bladder biopsy, single incision sling (Altis)   She passed her postoperative void trial.   Postoperative course has been uncomplicated.   Today she reports she had some soreness around the vaginal opening but that has improved. She had some urgency still but overall well controlled with myrbetriq.  UTI in the last 6 weeks? No  Pain? No  She has returned to her normal activity (except for postop restrictions) Vaginal bulge? No  Stress incontinence: No  Urgency/frequency: Yes  Urge incontinence: Yes - still has leakage mostly in the morning on the way to the bathroom. Occasional leakage with urge during the day.  Voiding dysfunction: No  Bowel issues: No   Subjective Success: Do you usually have a bulge or something falling out that you can see or feel in the vaginal area? No  Retreatment Success: Any retreatment with surgery or pessary for any compartment? No    Medications: She has a current medication list which includes the following prescription(s): acetaminophen, albuterol, alendronate, aspirin ec, calcium carb-cholecalciferol, cetirizine, vitamin d, ezetimibe, magnesium, metformin, metoprolol succinate, multiple vitamins-minerals, myrbetriq, omeprazole, systane ultra, ramipril, rosuvastatin, temazepam, and estradiol.   Allergies: Patient is allergic to codeine, amoxicillin-pot clavulanate, and lexapro [escitalopram].   Physical Exam: BP (!) 156/77   Pulse 65   LMP  (LMP Unknown)    Pelvic Examination: Vagina: Incisions well healed, no sutures present and no granulation tissue.  No tenderness. No apical tenderness.  No visible or palpable mesh.  POP-Q: POP-Q  -2                                            Aa   -2                                            Ba  -2                                              C   3                                            Gh  7                                            Pb  2.5                                            tvl   -2  Ap  -2                                            Bp                                                 D    ---------------------------------------------------------  Assessment and Plan:  1. Post-operative state   2. Vaginal atrophy    - Well healed.  - She does not feel that she wants further treatment for her OAB symptoms. We did discuss third line therapies but she feels myrbetriq is controlling her symptoms well.  - Continue vaginal estrogen for atrophy, refill provided - Can resume regular activity including exercise. Discussed avoidance of heavy lifting and straining long term to reduce the risk of recurrence.  - Follow up 1 year for med refills  Marguerita Beards, MD

## 2024-01-10 ENCOUNTER — Encounter: Payer: Self-pay | Admitting: Internal Medicine

## 2024-01-10 ENCOUNTER — Ambulatory Visit: Payer: Medicare Other | Attending: Internal Medicine | Admitting: Internal Medicine

## 2024-01-10 VITALS — BP 146/68 | HR 62 | Ht 64.5 in | Wt 200.0 lb

## 2024-01-10 DIAGNOSIS — E782 Mixed hyperlipidemia: Secondary | ICD-10-CM

## 2024-01-10 DIAGNOSIS — I10 Essential (primary) hypertension: Secondary | ICD-10-CM

## 2024-01-10 DIAGNOSIS — I2510 Atherosclerotic heart disease of native coronary artery without angina pectoris: Secondary | ICD-10-CM | POA: Diagnosis not present

## 2024-01-10 NOTE — Progress Notes (Signed)
 OFFICE NOTE  Chief Complaint:  Routine follow-up  Primary Care Physician: Nelwyn Salisbury, MD  HPI:  Natalie Hall is a 73 y.o. female who I last saw in the hospital a few weeks ago. She was admitted for an SVT which spontaneously converted in the ER on Cardizem and did not return. Unfortunately she had a non-ST elevation MI with troponin peak of 1.64. She underwent a Lexiscan Myoview which demonstrated a moderate region of reversibility in the mid and apical segments of the inferior lateral Lindeman consistent with ischemia. EF was 61%. She then was referred to cardiac catheterization which demonstrated the following:  Cardiac Cath 05/24   Prox RCA lesion, 25% stenosed. The left ventricular systolic function is normal. Prox LAD to Mid LAD lesion, 15% stenosed. Mid RCA-1 lesion, 95% stenosed. Mid RCA-2 lesion, 95% stenosed. Post intervention, there is a 0% residual stenosis. SYNERGY DES 3X32 1. Single vessel obstructive CAD 2. Normal LV function 3. Successful stenting of the mid to distal RCA with a DES, SYNERGY DES 3X32 Plan: DAPT for one year. Patient may be a candidate for the TWILIGHT trial in which case study protocol will be followed. Anticipate DC in am.  Natalie Hall returns today and is doing quite well. She denies any chest pain or worsening shortness of breath. She is compliant with her medications. She did enroll in the TWILIGHT trial. Unfortunately, she was considering shoulder surgery but this will be delayed due to the necessity of dual antiplatelet therapy with her recent stent. I advised her the Tylenol or her tramadol would be safe medicines used for pain.  07/13/2016  Since I last saw her, she was admitted to the hospital for adenosine-responsive AVNRT. She underwent successful catheter ablation and has not had recurrence. She denies any chest pain or dyspnea. She remains in the TWILIGHT trial with Brillinta. Recent lipid profilw shows LDL remains not at goal at 104. HDL low  at 28. She is on Crestor 10 mg daily.  10/05/2016  Natalie Hall returns today for follow-up. She seems to be doing well from a coronary standpoint. She denies any chest pain or worsening dyspnea. 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 She underwent placement of a radioactive seed which was ultimately removed after a week and she is recovering from that procedure. She was off of her aspirin for 1 week but remained on Brilinta. She is still in the twilight trial. Her cholesterol was higher than it had been previously. I increased her Crestor to 20 mg daily and we are awaiting her lipid profile which was recently drawn. She denies any recurrent AVNRT.  06/10/2017  Natalie Hall was seen today in follow-up. She was seen in the [facility] for follow-up of the Twilight trial - this has completed. She is now more than one year since drug-eluting stent placement. She is angina free. At this point she will no longer need dual antiplatelet therapy and I advised that she go on aspirin 81 mg daily. Cholesterol is now at goal based on labs 3 months ago which showed total cholesterol 139, triglycerides 177, HL 34 and LDL-C is 69. She is on Crestor 40 mg daily. She is contemplating left shoulder surgery and from my standpoint she is clear for that.  12/30/2017  Natalie Hall was seen today in follow-up.  This is an annual follow-up.  Overall she is doing well.  She had a successful shoulder surgery and has good range of  motion in her left shoulder.  She has been under a lot of stress taking care of her mother-in-law who recently passed away.  She did have labs in December which showed total cholesterol 146, triglycerides 196, HDL 32 and LDL 75.  She does report she had some weight gain and decreased activity and I feel like her cholesterol could be better controlled with more vigilant diet.  She denies any recurrent chest pain or worsening shortness of breath.  07/14/2018  Natalie Hall  is seen today for routine follow-up. It has been 6 months with a plan for weight loss and dietary changes to get cholesterol to goal. Unfortunately, weight is not significantly different. Repeat lipid profile shows cholesterol is higher with TC 157, TG 241, HDL 28 a nd LDL 81. Her goal LDL is <70. I had not recommended additional medication due to her commitment to making dietary changes, however, at this point, she still has additional residual risk and her cholesterol is actually higher, so I'm recommending therapy.  06/08/2019  Natalie Hall is seen today in follow-up.  Overall she seems to be doing well.  She has been working closely with the weight management center at [facility] and is lost a significant weight.  She went from 213 pounds down to 183 in the office today however she says weighs in the 170s at home.  Overall she remains asymptomatic, denies chest pain or worsening shortness of breath.  EKG shows a sinus bradycardia in the 50s today.  Her labs all look excellent recently.  Lipids 2 months ago showed total cholesterol 105, triglycerides 148, HDL 26 and LDL 49. Also, she is seeking clearance for achilles surgery with Dr. Victorino Dike.  12/03/2019  Natalie Hall is seen today in follow-up.  She continues to do well.  She did have her Achilles tendon repair.  She was not quite as active but recently has picked that up.  She continues to work with the weight management center.  Her weight is slightly trended up but in general is down.  Her cholesterol is much better.  Labs 2 weeks ago showed total cholesterol 110, triglycerides 123, HDL 29 LDL 59.  She denies any chest pain.  She has had no further arrhythmias.   12/14/2020  Natalie Hall returns today for follow-up.  Overall she is doing well.  She had a recent lipid profile showing total cholesterol 106, triglycerides 141, Chelle 28 and LDL 53.  She was concerned about a mild elevation in ALT to 37.  This is previously better controlled.  She said recent imaging  showed some fatty liver disease.  She said she has a history of a melanoma and was concerned that this could indicate an issue such as a tumor, however I feel that this is very unlikely.  She denies any recurrent tachypalpitations.  Blood pressure was noted to be low today however she recently was on Ozempic but did not tolerate it for weight loss.  12/22/2021  Natalie Hall is seen today in follow-up.  She is doing very well.  She just had some shoulder surgery on her right shoulder and previously had shoulder surgery on the left a number of years ago.  She denies any chest pain or worsening shortness of breath.  Lipids are well controlled with total cholesterol 104, triglycerides 122 HDL 32 and LDL 50.  Liver enzymes are also normal AST and ALT of 17, down from 22 respectively.  12/19/2021  Natalie Hall is seen today in follow-up.  Overall  she says she is under a lot of stress caring for her mother but generally feels well.  Blood pressure was low normal today 108/62.  She had a recent lipid profile which showed total cholesterol 102, triglycerides 116, HDL 31 and LDL 50.  She had recent shoulder injury after a fall and imaging which showed aortic atherosclerosis this caused her some concern however I reassured her she is already on appropriate therapy for this.  01/10/2024  Natalie Hall returns today for follow-up.  She seems to be doing well although reports being fatigued.  She is caring for her mother who has Alzheimer's and dementia with agitation.  She also has had a stroke.  She is 89 I believe.  Other than that she denies any chest pain or significant shortness of breath with exertion.  She had lipid testing last July and her LDL was 68 which is well-controlled.  Blood pressure initially today was 146/68 however recheck came down to 138/68.  She had interim pelvic floor surgery which she said went very well.  We cleared her for this procedure and she had no complaints with it.  She denies any recurrent  palpitations.  PMHx:  Past Medical History:  Diagnosis Date   Asthma    followe by pcp'  uncomplicated,  rescue inhaler prn   Chronic constipation    Chronic low back pain    Coronary artery disease 2017   cardiologist--- dr Rennis Golden;   NSTEMI  w/ positive NUC 03-13-2016 in setting 8 hrs SVT;  cath 03-14-2016  w/ PCI and DES to mid to distal RCA,  normal LVEF   Fatty liver    followed by pcp  ;  in epic abd ultrasound 05-15-2023   GERD (gastroesophageal reflux disease)    Hiatal hernia    History of non-ST elevation myocardial infarction (NSTEMI) 03/11/2016   Hx of adenomatous colonic polyps    Hyperlipidemia, mixed    Hypertension    Malignant melanoma of eye, left (HCC) 2017   followed by dr c. 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 Eugenio Hoes SURGERY Left 07/05/2003   @[facility]  by dr Thurston Hole;   peroneal tendon repair   BIOPSY  11/18/2023   Procedure: BLADDER BIOPSY;  Surgeon: Marguerita Beards, MD;  Location: [facility];  Service: Gynecology;;   BLADDER SUSPENSION N/A 11/18/2023   Procedure: TRANSVAGINAL TAPE (TVT) PROCEDURE--Altis Sling;  Surgeon: Marguerita Beards, MD;  Location: [facility];  Service: Gynecology;  Laterality: N/A;   CARDIAC CATHETERIZATION N/A 03/14/2016   Procedure: Left Heart Cath and Coronary Angiography;  Surgeon: Peter M Swaziland, MD;   Location: [facility] INVASIVE CV LAB;  Service: Cardiovascular;  Laterality: N/A;   CARDIAC CATHETERIZATION N/A 03/14/2016   Procedure: Coronary Stent Intervention;  Surgeon: Peter M Swaziland, MD;  Location: [facility] INVASIVE CV LAB;  Service:  Cardiovascular;  Laterality: N/A;   CARPAL TUNNEL RELEASE Left 04/01/2001   @ [facility] by dr sypher;   And left thumb pulley release   CATARACT EXTRACTION W/ INTRAOCULAR LENS IMPLANT Bilateral 2014   COLONOSCOPY WITH PROPOFOL  08/14/2021   by dr d. Christella Hartigan   COLPOCLEISIS N/A 11/18/2023   Procedure: COLPOCLEISIS;  Surgeon: Marguerita Beards, MD;  Location: [facility];  Service: Gynecology;  Laterality: N/A;   CYSTOSCOPY N/A 11/18/2023   Procedure: CYSTOSCOPY;  Surgeon: Marguerita Beards, MD;  Location: [facility];  Service: Gynecology;  Laterality: N/A;   ELECTROPHYSIOLOGIC STUDY N/A 05/21/2016   Procedure: SVT Ablation;  Surgeon: Will Jorja Loa, MD;  Location: [facility] INVASIVE CV LAB;  Service: Cardiovascular;  Laterality: N/A;   EXCISION HAGLUND'S DEFORMITY WITH ACHILLES TENDON REPAIR Left 07/02/2019   Procedure: Left Achilles tendon debridement and reconstruction and excision of Haglund deformity;  Surgeon: Toni Arthurs, MD;  Location: [facility];  Service: Orthopedics;  Laterality: Left;   GASTROCNEMIUS RECESSION Left 07/02/2019   Procedure: Gastroc recession;  Surgeon: Toni Arthurs, MD;  Location: [facility];  Service: Orthopedics;  Laterality: Left;   IR KYPHO THORACIC WITH BONE BIOPSY  12/18/2018   IR RADIOLOGIST EVAL & MGMT  12/09/2018   IR RADIOLOGIST EVAL & MGMT  04/30/2019   KNEE CLOSED REDUCTION Left 02/03/2014   Procedure: CLOSED MANIPULATION LEFT KNEE;  Surgeon: Loanne Drilling, MD;  Location: [facility];  Service: Orthopedics;  Laterality: Left;   LAPAROSCOPIC CHOLECYSTECTOMY  1996   RADIOACTIVE PLAQUE INSERTION Left 09/17/2016   @ [facility] by dr c. Gwendalyn Ege;   I-125 plaque application to left  eye   RADIOACTIVE PLAQUE REMOVAL Left 09/21/2016   @[facility] by dr c. Gwendalyn Ege;   left eye   RECTOCELE REPAIR N/A 11/18/2023   Procedure: LEVATOR PLICATION AND PERINEORRHAPHY;  Surgeon: Marguerita Beards, MD;  Location: [facility];  Service: Gynecology;  Laterality: N/A;   REVERSE SHOULDER ARTHROPLASTY Left 06/28/2017   Procedure: LEFT SHOULDER REVERSE SHOULDER ARTHROPLASTY;  Surgeon: Beverely Low, MD;  Location: [facility];  Service: Orthopedics;  Laterality: Left;   REVERSE SHOULDER ARTHROPLASTY Right 11/03/2021   Procedure: REVERSE SHOULDER ARTHROPLASTY;  Surgeon: Beverely Low, MD;  Location: [facility];  Service: Orthopedics;  Laterality: Right;  with ISB   TOTAL KNEE ARTHROPLASTY Left 12/21/2013   Procedure: LEFT TOTAL KNEE ARTHROPLASTY;  Surgeon: Loanne Drilling, MD;  Location: [facility];  Service: Orthopedics;  Laterality: Left;   VARICOSE VEIN SURGERY Bilateral    laser ablation    FAMHx:  Family History  Problem Relation Age of Onset   Colon polyps Mother    Diabetes Mother    High blood pressure Mother    High Cholesterol Mother    Thyroid disease Mother    Anxiety disorder Mother    Diabetes Father    High blood pressure Father    High Cholesterol Father    Heart disease Father    Depression Father    Obesity Father    Lung cancer Brother        spleen, bone   Colon cancer Maternal Aunt    Liver cancer Maternal Aunt    Breast cancer Other    Coronary artery disease Other    Colon cancer Other    Diabetes Other    Hypertension Other    Kidney disease Other    Lung cancer Other    Esophageal cancer Neg Hx  Stomach cancer Neg Hx    Rectal cancer Neg Hx    Bladder Cancer Neg Hx    Uterine cancer Neg Hx     SOCHx:   reports that she has never smoked. She has never used smokeless tobacco. She reports that she does not drink alcohol and does not use drugs.  ALLERGIES:  Allergies  Allergen Reactions   Codeine Shortness Of Breath, Rash and Other (See  Comments)    Flushing, tolerates hydrocodone    Amoxicillin-Pot Clavulanate Rash     Has patient had a PCN reaction causing immediate rash, facial/tongue/throat swelling, SOB or lightheadedness with hypotension: No Has patient had a PCN reaction causing severe rash involving mucus membranes or skin necrosis: No Has patient had a PCN reaction that required hospitalization: No Has patient had a PCN reaction occurring within the last 10 years: Yes If all of the above answers are "NO", then may proceed with Cephalosporin use.   Lexapro [Escitalopram] Rash    ROS: Pertinent items noted in HPI and remainder of comprehensive ROS otherwise negative.  HOME MEDS: Current Outpatient Medications  Medication Sig Dispense Refill   acetaminophen (TYLENOL) 500 MG tablet Take 1 tablet (500 mg total) by mouth every 6 (six) hours as needed (pain). 30 tablet 0   albuterol (VENTOLIN HFA) 108 (90 Base) MCG/ACT inhaler Inhale 2 puffs into the lungs every 4 (four) hours as needed for wheezing or shortness of breath. (Patient taking differently: Inhale 2 puffs into the lungs every 4 (four) hours as needed for wheezing or shortness of breath.) 18 g 2   alendronate (FOSAMAX) 35 MG tablet Take 1 tablet (35 mg total) by mouth every 7 (seven) days. Take with a full glass of water on an empty stomach. (Patient taking differently: Take 35 mg by mouth every 7 (seven) days. Take with a full glass of water on an empty stomach.  (Saturday's )) 12 tablet 3   aspirin EC 81 MG tablet Take 81 mg by mouth daily. Swallow whole.     Calcium Carb-Cholecalciferol 600-800 MG-UNIT TABS Take 2 tablets by mouth daily.     cetirizine (ZYRTEC) 10 MG tablet Take 10 mg by mouth daily as needed. PRN     Cholecalciferol (VITAMIN D) 50 MCG (2000 UT) CAPS Take 1 capsule (2,000 Units total) by mouth daily. 30 capsule 0   estradiol (ESTRACE) 0.1 MG/GM vaginal cream Place 0.5g nightly two times a week 42.5 g 11   ezetimibe (ZETIA) 10 MG tablet TAKE  1 TABLET BY MOUTH DAILY 90 tablet 0   Magnesium 250 MG TABS Take 1 tablet by mouth daily.     metFORMIN (GLUCOPHAGE) 500 MG tablet Take 1 tablet (500 mg total) by mouth 2 (two) times daily with a meal. (Patient taking differently: Take 500 mg by mouth 2 (two) times daily with a meal.) 180 tablet 0   metoprolol succinate (TOPROL-XL) 25 MG 24 hr tablet TAKE 1 TABLET BY MOUTH TWICE  DAILY 180 tablet 0   Multiple Vitamins-Minerals (MULTIVITAL PO) Take 1 tablet by mouth daily.     MYRBETRIQ 50 MG TB24 tablet Take 1 tablet (50 mg total) by mouth daily. (Patient taking differently: Take 50 mg by mouth daily.) 90 tablet 3   omeprazole (PRILOSEC) 40 MG capsule TAKE 1 CAPSULE BY MOUTH DAILY (Patient taking differently: Take 40 mg by mouth daily.) 90 capsule 3   Polyethyl Glycol-Propyl Glycol (SYSTANE ULTRA) 0.4-0.3 % SOLN Apply to eye as needed. PRN     ramipril (  ALTACE) 10 MG capsule TAKE 1 CAPSULE BY MOUTH DAILY 90 capsule 0   rosuvastatin (CRESTOR) 40 MG tablet TAKE 1 TABLET BY MOUTH DAILY 90 tablet 0   temazepam (RESTORIL) 15 MG capsule TAKE 1 CAPSULE BY MOUTH EVERY  NIGHT AT BEDTIME AS NEEDED FOR  SLEEP (Patient taking differently: Take 15 mg by mouth at bedtime as needed for sleep.) 90 capsule 1   No current facility-administered medications for this visit.    LABS/IMAGING: No results found for this or any previous visit (from the past 48 hours). No results found.  WEIGHTS: Wt Readings from Last 3 Encounters:  01/10/24 200 lb (90.7 kg)  11/18/23 198 lb 9.6 oz (90.1 kg)  11/07/23 195 lb 6.4 oz (88.6 kg)    VITALS: BP (!) 146/68 (BP Location: Left Arm, Patient Position: Sitting, Cuff Size: Large)   Pulse 62   Ht 5' 4.5" (1.638 m)   Wt 200 lb (90.7 kg)   LMP  (LMP Unknown)   SpO2 92%   BMI 33.80 kg/m   EXAM: General appearance: alert and no distress Neck: no carotid bruit and no JVD Lungs: clear to auscultation bilaterally Heart: regular rate and rhythm, S1, S2 normal, no murmur,  click, rub or gallop Abdomen: soft, non-tender; bowel sounds normal; no masses,  no organomegaly Extremities: extremities normal, atraumatic, no cyanosis or edema Pulses: 2+ and symmetric Skin: Skin color, texture, turgor normal. No rashes or lesions Neurologic: Grossly normal Psych: Pleasant  EKG: EKG Interpretation Date/Time:  Friday January 10 2024 08:15:24 EDT Ventricular Rate:  62 PR Interval:  190 QRS Duration:  100 QT Interval:  416 QTC Calculation: 422 R Axis:   -19  Text Interpretation: Normal sinus rhythm Low voltage QRS Septal infarct (cited on or before 22-May-2016) When compared with ECG of 22-May-2016 06:23, No significant change was found Confirmed by Zoila Shutter [phone]) on 01/10/2024 8:18:31 AM    ASSESSMENT: Coronary artery disease status post PCI with a drug-eluting stent to the mid/distal RCA (02/2016) AVNRT - s/p catheter ablation in 04/2016 S/p NSTEMI with abnormal nuclear stress test Dyslipidemia Hypertension Mildly elevated ALT - hepatic steatosis  PLAN: 1.   Mrs. Hall is doing well without any chest pain or worsening shortness of breath.  She denies any recurrent palpitations or tach arrhythmias.  Her lipids are well-controlled.  Her blood pressure initially was elevated however is improved and is measured lower at home.  She had recent pelvic floor surgery which she tolerated well.  No changes to her medicines today.  Plan follow-up annually or sooner as necessary.  Chrystie Nose, MD, [facility], FACP  [place]  [facility]  Medical Director of the Advanced Lipid Disorders &  Cardiovascular Risk Reduction Clinic Diplomate of the American Board of Clinical Lipidology Attending Cardiologist  Direct Dial: [phone]  Fax: [phone]  Website:  [email or web address] 01/10/2024, 8:18 AM

## 2024-01-10 NOTE — Patient Instructions (Signed)
 Medication Instructions:  Your physician recommends that you continue on your current medications as directed. Please refer to the Current Medication list given to you today.    *If you need a refill on your cardiac medications before your next appointment, please call your pharmacy*   Lab Work: NONE    If you have labs (blood work) drawn today and your tests are completely normal, you will receive your results only by: MyChart Message (if you have MyChart) OR A paper copy in the mail If you have any lab test that is abnormal or we need to change your treatment, we will call you to review the results.   Testing/Procedures: NONE    Follow-Up: At [facility], you and your health needs are our priority.  As part of our continuing mission to provide you with exceptional heart care, we have created designated Provider Care Teams.  These Care Teams include your primary Cardiologist (physician) and Advanced Practice Providers (APPs -  Physician Assistants and Nurse Practitioners) who all work together to provide you with the care you need, when you need it.  We recommend signing up for the patient portal called "MyChart".  Sign up information is provided on this After Visit Summary.  MyChart is used to connect with patients for Virtual Visits (Telemedicine).  Patients are able to view lab/test results, encounter notes, upcoming appointments, etc.  Non-urgent messages can be sent to your provider as well.   To learn more about what you can do with MyChart, go to [email or web address].    Your next appointment:   1 year(s)  The format for your next appointment:   In Person  Provider:   Edd Fabian, FNP, Micah Flesher, PA-C, Marjie Skiff, PA-C, Robet Leu, PA-C, Juanda Crumble, PA-C, Joni Reining, DNP, ANP, Azalee Course, PA-C, Bernadene Person, NP, or Reather Littler, NP      Other Instructions

## 2024-01-14 ENCOUNTER — Other Ambulatory Visit: Payer: Self-pay | Admitting: Family Medicine

## 2024-01-16 ENCOUNTER — Encounter: Payer: Self-pay | Admitting: Bariatrics

## 2024-01-16 ENCOUNTER — Ambulatory Visit (INDEPENDENT_AMBULATORY_CARE_PROVIDER_SITE_OTHER): Payer: Medicare Other | Admitting: Bariatrics

## 2024-01-16 VITALS — BP 151/83 | HR 69 | Temp 97.8°F | Ht 64.0 in | Wt 194.0 lb

## 2024-01-16 DIAGNOSIS — E66811 Obesity, class 1: Secondary | ICD-10-CM | POA: Diagnosis not present

## 2024-01-16 DIAGNOSIS — Z6833 Body mass index (BMI) 33.0-33.9, adult: Secondary | ICD-10-CM

## 2024-01-16 DIAGNOSIS — E785 Hyperlipidemia, unspecified: Secondary | ICD-10-CM | POA: Diagnosis not present

## 2024-01-16 DIAGNOSIS — E6609 Other obesity due to excess calories: Secondary | ICD-10-CM

## 2024-01-16 DIAGNOSIS — R7303 Prediabetes: Secondary | ICD-10-CM

## 2024-01-16 MED ORDER — METFORMIN HCL 500 MG PO TABS: 500.0000 mg | ORAL_TABLET | Freq: Two times a day (BID) | ORAL | 0 refills | Status: DC

## 2024-01-16 NOTE — Progress Notes (Signed)
 WEIGHT SUMMARY AND BIOMETRICS  Weight Lost Since Last Visit: 0  Weight Gained Since Last Visit: 3lb   Vitals Temp: 97.8 F (36.6 C) BP: (!) 151/83 Pulse Rate: 69 SpO2: 99 %   Anthropometric Measurements Height: 5\' 4"  (1.626 m) Weight: 194 lb (88 kg) BMI (Calculated): 33.28 Weight at Last Visit: 191lb Weight Lost Since Last Visit: 0 Weight Gained Since Last Visit: 3lb Starting Weight: 213lb Total Weight Loss (lbs): 19 lb (8.618 kg)   Body Composition  Body Fat %: 49.4 % Fat Mass (lbs): 96 lbs Muscle Mass (lbs): 93.4 lbs Total Body Water (lbs): 67.8 lbs Visceral Fat Rating : 15   Other Clinical Data Fasting: no Labs: no Today's Visit #: 9 Starting Date: 08/04/18    OBESITY Natalie Hall is here to discuss her progress with her obesity treatment plan along with follow-up of her obesity related diagnoses.    Nutrition Plan: the Category 2 plan - 50% adherence.  Current exercise: walking  Interim History:  She is up 3 lbs since her last visit. She has had surgery. She recently saw her cardiologist.  Eating all of the food on the plan., Protein intake is as prescribed, Is exceeding snack calorie allotment, Is not skipping meals, Journaling consistently., Not meeting calorie goals., and Water intake is adequate.   Pharmacotherapy: Natalie Hall is on Metformin 500 mg twice daily with meals Adverse side effects: None Hunger is moderately controlled.  Cravings are moderately controlled.  Assessment/Plan:   1. Prediabetes Last A1c was 6.0  Medication(s): Metformin 500 mg twice daily with meals Lab Results  Component Value Date   HGBA1C 6.0 04/29/2023   HGBA1C 6.0 04/27/2022   HGBA1C 6.1 (H) 10/20/2021   HGBA1C 5.9 04/13/2021   HGBA1C 5.5 12/06/2020   Lab Results  Component Value Date   INSULIN 19.4 04/29/2023   INSULIN 18.6 07/23/2022   INSULIN 16.6  12/06/2020   INSULIN 18.5 11/17/2019   INSULIN 15.4 11/20/2018    Plan: Will minimize all refined carbohydrates both sweets and starches.  Will work on the plan and exercise.  Consider both aerobic and resistance training.  Will keep protein, water, and fiber intake high.  Increase Polyunsaturated and Monounsaturated fats to increase satiety and encourage weight loss.  Aim for 7 to 9 hours of sleep nightly.  Will continue medications.  Discussed healthy fruits and fruit sheet given.   Hyperlipidemia LDL is at goal. Medication(s): Zetia and Crestor  Cardiovascular risk factors: advanced age (older than 42 for men, 74 for women), dyslipidemia, hypertension, obesity (BMI >= 30 kg/m2), and sedentary lifestyle  Lab Results  Component Value Date   CHOL 100 04/29/2023   HDL 27.90 (L) 04/29/2023   LDLCALC 43 04/29/2023   LDLDIRECT 96.0 03/11/2018   TRIG 146.0 04/29/2023   CHOLHDL 4 04/29/2023   Lab Results  Component Value Date   ALT 21 04/29/2023  AST 25 04/29/2023   ALKPHOS 33 (L) 04/29/2023   BILITOT 0.6 04/29/2023   The ASCVD Risk score (Arnett DK, et al., 2019) failed to calculate for the following reasons:   Risk score cannot be calculated because patient has a medical history suggesting prior/existing ASCVD  Plan:  Continue statin.  Will avoid all trans fats.  Will read labels Will minimize saturated fats except the following: low fat meats in moderation, diary, and limited dark chocolate.  She will continue her walking.     Generalized Obesity: Current BMI BMI (Calculated): 33.28   Pharmacotherapy Plan Continue  Metformin 500 mg twice daily with meals  Natalie Hall is currently in the action stage of change. As such, her goal is to continue with weight loss efforts.  She has agreed to the Category 2 plan.  Exercise goals: Older adults should determine their level of effort for physical activity relative to their level of fitness.   Behavioral modification  strategies: increasing lean protein intake, decreasing simple carbohydrates , no meal skipping, decrease eating out, meal planning , increase water intake, better snacking choices, planning for success, increasing vegetables, and increasing fiber rich foods.  Natalie Hall has agreed to follow-up with our clinic in 6 weeks.      Objective:   VITALS: Per patient if applicable, see vitals. GENERAL: Alert and in no acute distress. CARDIOPULMONARY: No increased WOB. Speaking in clear sentences.  PSYCH: Pleasant and cooperative. Speech normal rate and rhythm. Affect is appropriate. Insight and judgement are appropriate. Attention is focused, linear, and appropriate.  NEURO: Oriented as arrived to appointment on time with no prompting.   Attestation Statements:    This was prepared with the assistance of Engineer, civil (consulting).  Occasional wrong-word or sound-a-like substitutions may have occurred due to the inherent limitations of voice recognition   Corinna Capra, DO

## 2024-01-21 ENCOUNTER — Ambulatory Visit (INDEPENDENT_AMBULATORY_CARE_PROVIDER_SITE_OTHER): Payer: Medicare Other | Admitting: Family Medicine

## 2024-01-21 DIAGNOSIS — Z0000 Encounter for general adult medical examination without abnormal findings: Secondary | ICD-10-CM

## 2024-01-21 NOTE — Progress Notes (Signed)
 PATIENT CHECK-IN and HEALTH RISK ASSESSMENT QUESTIONNAIRE:  -completed by phone/video for upcoming Medicare Preventive Visit  Pre-Visit Check-in: 1)Vitals (height, wt, BP, etc) - record in vitals section for visit on day of visit Request home vitals (wt, BP, etc.) and enter into vitals, THEN update Vital Signs SmartPhrase below at the top of the HPI. See below.  2)Review and Update Medications, Allergies PMH, Surgeries, Social history in Epic 3)Hospitalizations in the last year with date/reason? Y, bladder surgery in january  4)Review and Update Care Team (patient's specialists) in Epic 5) Complete PHQ9 in Epic  6) Complete Fall Screening in Epic 7)Review all Health Maintenance Due and order under PCP if not done.  Medicare Wellness Patient Questionnaire:  Answer theses question about your habits: How often do you have a drink containing alcohol?n How many drinks containing alcohol do you have on a typical day when you are drinking?na How often do you have six or more drinks on one occasion?n Have you ever smoked?never How many packs a day do/did you smoke? n Do you use smokeless tobacco?n Do you use an illicit drugs?n On average, how many days per week do you engage in moderate to strenuous exercise (like a brisk walk)? She is doing 1 mile 3 times a week - 20 to 30 minutes, she used to do silver sneakers Typical diet: working with [facility] and is working on [employer]   Answer theses question about your everyday activities: Can you perform most household chores?y Are you deaf or have significant trouble hearing?n Do you feel that you have a problem with memory?n Do you feel safe at home?y Last dentist visit? Goes every 6 months 8. Do you have any difficulty performing your everyday activities?n Are you having any difficulty walking, taking medications on your own, and or difficulty managing daily home needs?n Do you have difficulty walking or climbing  stairs?n - but tries to avoid Do you have difficulty dressing or bathing?n Do you have difficulty doing errands alone such as visiting a doctor's office or shopping?n Do you currently have any difficulty preparing food and eating?n Do you currently have any difficulty using the toilet?n Do you have any difficulty managing your finances?n Do you have any difficulties with housekeeping of managing your housekeeping?n   Do you have Advanced Directives in place (Living Will, Healthcare Power or Attorney)? y   Last eye Exam and location?[facility] goes yearly, has radiation retinopathy - sees dr Leonia Corona every 1-2 months. Has CXR and [name] every year.    Do you currently use prescribed or non-prescribed narcotic or opioid pain medications?n        ----------------------------------------------------------------------------------------------------------------------------------------------------------------------------------------------------------------------  Because this visit was a virtual/telehealth visit, some criteria may be missing or patient reported. Any vitals not documented were not able to be obtained and vitals that have been documented are patient reported.    MEDICARE ANNUAL PREVENTIVE VISIT WITH PROVIDER: (Welcome to Medicare, initial annual wellness or annual wellness exam)  Virtual Visit via Phone Note  I connected with Natalie Hall on 01/21/24 by phone and verified that I am speaking with the correct person using two identifiers.  Location patient: home Location provider:work or home office Persons participating in the virtual visit: patient, provider  Concerns and/or follow up today: doing ok, recovered form bladder surgery she had in January.   See HM section in Epic for other details of completed HM.    ROS: negative for report of fevers, unintentional weight loss, vision changes, vision  loss, hearing loss or change, chest pain, sob, hemoptysis, melena, hematochezia,  hematuria, falls, bleeding or bruising, thoughts of suicide or self harm, memory loss  Patient-completed extensive health risk assessment - reviewed and discussed with the patient: See Health Risk Assessment completed with patient prior to the visit either above or in recent phone note. This was reviewed in detailed with the patient today and appropriate recommendations, orders and referrals were placed as needed per Summary below and patient instructions.   Review of Medical History: -PMH, PSH, Family History and current specialty and care providers reviewed and updated and listed below   Patient Care Team: Nelwyn Salisbury, MD as PCP - General Hilty, Lisette Abu, MD as PCP - Cardiology (Cardiology) Beverely Low, MD as Consulting Physician (Orthopedic Surgery) Ortho, Emerge (Specialist) Chrystie Nose, MD as Consulting Physician (Cardiology) Sharyn Lull, Elvin So, MD as Referring Physician (Dermatology)   Past Medical History:  Diagnosis Date   Asthma    followe by pcp'  uncomplicated,  rescue inhaler prn   Chronic constipation    Chronic low back pain    Coronary artery disease 2017   cardiologist--- dr Rennis Golden;   NSTEMI  w/ positive NUC 03-13-2016 in setting 8 hrs SVT;  cath 03-14-2016  w/ PCI and DES to mid to distal RCA,  normal LVEF   Fatty liver    followed by pcp  ;  in epic abd ultrasound 05-15-2023   GERD (gastroesophageal reflux disease)    Hiatal hernia    History of non-ST elevation myocardial infarction (NSTEMI) 03/11/2016   Hx of adenomatous colonic polyps    Hyperlipidemia, mixed    Hypertension    Malignant melanoma of eye, left (HCC) 2017   followed by dr c. 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 Eugenio Hoes SURGERY Left 07/05/2003   @[facility]  by dr Thurston Hole;   peroneal tendon repair   BIOPSY  11/18/2023   Procedure: BLADDER BIOPSY;  Surgeon: Marguerita Beards, MD;  Location: [facility];  Service: Gynecology;;   BLADDER SUSPENSION N/A 11/18/2023   Procedure: TRANSVAGINAL TAPE (TVT) PROCEDURE--Altis Sling;  Surgeon: Marguerita Beards, MD;  Location[facility];  Service: Gynecology;  Laterality: N/A;   CARDIAC CATHETERIZATION N/A 03/14/2016   Procedure: Left Heart Cath and Coronary Angiography;  Surgeon: Peter M Swaziland, MD;  Location: [facility] INVASIVE CV LAB;  Service: Cardiovascular;  Laterality: N/A;   CARDIAC CATHETERIZATION N/A 03/14/2016   Procedure: Coronary Stent Intervention;  Surgeon: Peter M Swaziland, MD;  Location: [facility] CV LAB;  Service: Cardiovascular;  Laterality: N/A;   CARPAL TUNNEL RELEASE Left 04/01/2001   @ [facility] by dr sypher;   And left thumb pulley release   CATARACT EXTRACTION W/ INTRAOCULAR LENS IMPLANT Bilateral 2014   COLONOSCOPY WITH PROPOFOL  08/14/2021   by dr d. Christella Hartigan  COLPOCLEISIS N/A 11/18/2023   Procedure: COLPOCLEISIS;  Surgeon: Marguerita Beards, MD;  Location: [facility];  Service: Gynecology;  Laterality: N/A;   CYSTOSCOPY N/A 11/18/2023   Procedure: CYSTOSCOPY;  Surgeon: Marguerita Beards, MD;  Location: [facility];  Service: Gynecology;  Laterality: N/A;   ELECTROPHYSIOLOGIC STUDY N/A 05/21/2016   Procedure: SVT Ablation;  Surgeon: Will Jorja Loa, MD;  Location: [facility] INVASIVE CV LAB;  Service: Cardiovascular;  Laterality: N/A;   EXCISION HAGLUND'S  DEFORMITY WITH ACHILLES TENDON REPAIR Left 07/02/2019   Procedure: Left Achilles tendon debridement and reconstruction and excision of Haglund deformity;  Surgeon: Toni Arthurs, MD;  Location: [facility];  Service: Orthopedics;  Laterality: Left;   GASTROCNEMIUS RECESSION Left 07/02/2019   Procedure: Gastroc recession;  Surgeon: Toni Arthurs, MD;  Location: [facility];  Service: Orthopedics;  Laterality: Left;   IR KYPHO THORACIC WITH BONE BIOPSY  12/18/2018   IR RADIOLOGIST EVAL & MGMT  12/09/2018   IR RADIOLOGIST EVAL & MGMT  04/30/2019   KNEE CLOSED REDUCTION Left 02/03/2014   Procedure: CLOSED MANIPULATION LEFT KNEE;  Surgeon: Loanne Drilling, MD;  Location: [facility];  Service: Orthopedics;  Laterality: Left;   LAPAROSCOPIC CHOLECYSTECTOMY  1996   RADIOACTIVE PLAQUE INSERTION Left 09/17/2016   @ [facility] by dr c. Gwendalyn Ege;   I-125 plaque application to left eye   RADIOACTIVE PLAQUE REMOVAL Left 09/21/2016   @[facility] by dr c. Gwendalyn Ege;   left eye   RECTOCELE REPAIR N/A 11/18/2023   Procedure: LEVATOR PLICATION AND PERINEORRHAPHY;  Surgeon: Marguerita Beards, MD;  Location: [facility];  Service: Gynecology;  Laterality: N/A;   REVERSE SHOULDER ARTHROPLASTY Left 06/28/2017   Procedure: LEFT SHOULDER REVERSE SHOULDER ARTHROPLASTY;  Surgeon: Beverely Low, MD;  Location: [facility];  Service: Orthopedics;  Laterality: Left;   REVERSE SHOULDER ARTHROPLASTY Right 11/03/2021   Procedure: REVERSE SHOULDER ARTHROPLASTY;  Surgeon: Beverely Low, MD;  Location: [facility];  Service: Orthopedics;  Laterality: Right;  with ISB   TOTAL KNEE ARTHROPLASTY Left 12/21/2013   Procedure: LEFT TOTAL KNEE ARTHROPLASTY;  Surgeon: Loanne Drilling, MD;  Location: [facility];  Service: Orthopedics;  Laterality: Left;   VARICOSE VEIN SURGERY Bilateral    laser ablation    Social History   Socioeconomic History   Marital status: Married    Spouse name: Actuary   Number of  children: Not on file   Years of education: Not on file   Highest education level: Associate degree: occupational, Scientist, product/process development, or vocational program  Occupational History   Occupation: retired  Tobacco Use   Smoking status: Never   Smokeless tobacco: Never  Vaping Use   Vaping status: Never Used  Substance and Sexual Activity   Alcohol use: No    Alcohol/week: 0.0 standard drinks of alcohol   Drug use: Never   Sexual activity: Not Currently  Other Topics Concern   Not on file  Social History Narrative   Not on file   Social Drivers of Health   Financial Resource Strain: Low Risk  (01/14/2024)   Overall Financial Resource Strain (CARDIA)    Difficulty of Paying Living Expenses: Not hard at all  Food Insecurity: No Food Insecurity (01/14/2024)   Hunger Vital Sign    Worried About Running Out of Food in the Last Year: Never true    Ran Out of Food in the Last Year: Never true  Transportation Needs: No Transportation Needs (01/14/2024)   PRAPARE -  Administrator, Civil Service (Medical): No    Lack of Transportation (Non-Medical): No  Physical Activity: Insufficiently Active (01/14/2024)   Exercise Vital Sign    Days of Exercise per Week: 1 day    Minutes of Exercise per Session: 30 min  Stress: Stress Concern Present (01/14/2024)   [employer] of Occupational Health - Occupational Stress Questionnaire    Feeling of Stress : To some extent  Social Connections: Socially Integrated (01/14/2024)   Social Connection and Isolation Panel [NHANES]    Frequency of Communication with Friends and Family: More than three times a week    Frequency of Social Gatherings with Friends and Family: More than three times a week    Attends Religious Services: More than 4 times per year    Active Member of [employer] or Organizations: Yes    Attends Engineer, structural: More than 4 times per year    Marital Status: Married  Catering manager Violence: Not At Risk (12/21/2021)    Humiliation, Afraid, Rape, and Kick questionnaire    Fear of Current or Ex-Partner: No    Emotionally Abused: No    Physically Abused: No    Sexually Abused: No    Family History  Problem Relation Age of Onset   Colon polyps Mother    Diabetes Mother    High blood pressure Mother    High Cholesterol Mother    Thyroid disease Mother    Anxiety disorder Mother    Diabetes Father    High blood pressure Father    High Cholesterol Father    Heart disease Father    Depression Father    Obesity Father    Lung cancer Brother        spleen, bone   Colon cancer Maternal Aunt    Liver cancer Maternal Aunt    Breast cancer Other    Coronary artery disease Other    Colon cancer Other    Diabetes Other    Hypertension Other    Kidney disease Other    Lung cancer Other    Esophageal cancer Neg Hx    Stomach cancer Neg Hx    Rectal cancer Neg Hx    Bladder Cancer Neg Hx    Uterine cancer Neg Hx     Current Outpatient Medications on File Prior to Visit  Medication Sig Dispense Refill   acetaminophen (TYLENOL) 500 MG tablet Take 1 tablet (500 mg total) by mouth every 6 (six) hours as needed (pain). 30 tablet 0   albuterol (VENTOLIN HFA) 108 (90 Base) MCG/ACT inhaler Inhale 2 puffs into the lungs every 4 (four) hours as needed for wheezing or shortness of breath. (Patient taking differently: Inhale 2 puffs into the lungs every 4 (four) hours as needed for wheezing or shortness of breath.) 18 g 2   alendronate (FOSAMAX) 35 MG tablet Take 1 tablet (35 mg total) by mouth every 7 (seven) days. Take with a full glass of water on an empty stomach. (Patient taking differently: Take 35 mg by mouth every 7 (seven) days. Take with a full glass of water on an empty stomach.  (Saturday's )) 12 tablet 3   aspirin EC 81 MG tablet Take 81 mg by mouth daily. Swallow whole.     Calcium Carb-Cholecalciferol 600-800 MG-UNIT TABS Take 2 tablets by mouth daily.     cetirizine (ZYRTEC) 10 MG tablet Take 10 mg  by mouth daily as needed. PRN     Cholecalciferol (VITAMIN  D) 50 MCG (2000 UT) CAPS Take 1 capsule (2,000 Units total) by mouth daily. 30 capsule 0   estradiol (ESTRACE) 0.1 MG/GM vaginal cream Place 0.5g nightly two times a week 42.5 g 11   ezetimibe (ZETIA) 10 MG tablet TAKE 1 TABLET BY MOUTH DAILY 90 tablet 0   Magnesium 250 MG TABS Take 1 tablet by mouth daily.     metFORMIN (GLUCOPHAGE) 500 MG tablet Take 1 tablet (500 mg total) by mouth 2 (two) times daily with a meal. 180 tablet 0   metoprolol succinate (TOPROL-XL) 25 MG 24 hr tablet TAKE 1 TABLET BY MOUTH TWICE  DAILY 180 tablet 0   Multiple Vitamins-Minerals (MULTIVITAL PO) Take 1 tablet by mouth daily.     MYRBETRIQ 50 MG TB24 tablet Take 1 tablet (50 mg total) by mouth daily. (Patient taking differently: Take 50 mg by mouth daily.) 90 tablet 3   omeprazole (PRILOSEC) 40 MG capsule TAKE 1 CAPSULE BY MOUTH DAILY (Patient taking differently: Take 40 mg by mouth daily.) 90 capsule 3   Polyethyl Glycol-Propyl Glycol (SYSTANE ULTRA) 0.4-0.3 % SOLN Apply to eye as needed. PRN     ramipril (ALTACE) 10 MG capsule TAKE 1 CAPSULE BY MOUTH DAILY 90 capsule 0   rosuvastatin (CRESTOR) 40 MG tablet TAKE 1 TABLET BY MOUTH DAILY 90 tablet 0   temazepam (RESTORIL) 15 MG capsule TAKE 1 CAPSULE BY MOUTH EVERY  NIGHT AT BEDTIME AS NEEDED FOR  SLEEP 90 capsule 1   No current facility-administered medications on file prior to visit.    Allergies  Allergen Reactions   Codeine Shortness Of Breath, Rash and Other (See Comments)    Flushing, tolerates hydrocodone    Amoxicillin-Pot Clavulanate Rash     Has patient had a PCN reaction causing immediate rash, facial/tongue/throat swelling, SOB or lightheadedness with hypotension: No Has patient had a PCN reaction causing severe rash involving mucus membranes or skin necrosis: No Has patient had a PCN reaction that required hospitalization: No Has patient had a PCN reaction occurring within the last 10  years: Yes If all of the above answers are "NO", then may proceed with Cephalosporin use.   Lexapro [Escitalopram] Rash       Physical Exam Vitals requested from patient and listed below if patient had equipment and was able to obtain at home for this virtual visit: There were no vitals filed for this visit. Estimated body mass index is 33.3 kg/m as calculated from the following:   Height as of 01/16/24: 5\' 4"  (1.626 m).   Weight as of 01/16/24: 194 lb (88 kg).  EKG (optional): deferred due to virtual visit  GENERAL: alert, oriented, no acute distress detected, full vision exam deferred due to pandemic and/or virtual encounter  PSYCH/NEURO: pleasant and cooperative, no obvious depression or anxiety, speech and thought processing grossly intact, Cognitive function grossly intact  Flowsheet Row Office Visit from 04/29/2023 in [facility]  PHQ-9 Total Score 0           01/21/2024   10:49 AM 04/29/2023    9:10 AM 12/27/2022   10:24 AM 09/25/2022    1:59 PM 06/08/2022    1:57 PM  Depression screen PHQ 2/9  Decreased Interest 0 0 0 0 0  Down, Depressed, Hopeless 0 0 3 0 0  PHQ - 2 Score 0 0 3 0 0  Altered sleeping  0 0 0 0  Tired, decreased energy  0 3 0 1  Change in  appetite  0 3 0 0  Feeling bad or failure about yourself   0 0 0 0  Trouble concentrating  0 0 0 0  Moving slowly or fidgety/restless  0 0 0 0  Suicidal thoughts  0 0 0 0  PHQ-9 Score  0 9 0 1  Difficult doing work/chores  Not difficult at all Somewhat difficult Not difficult at all Not difficult at all       09/23/2022    2:07 PM 09/25/2022    2:00 PM 12/27/2022   10:22 AM 04/29/2023    9:10 AM 01/21/2024   10:49 AM  Fall Risk  Falls in the past year? 1 1 1  0 0  Number of falls in past year - Comments   right fractured shoulder March 31st    Was there an injury with Fall? 1 1 1  0 0  Was there an injury with Fall? - Comments  Bruised     Fall Risk Category Calculator 3 3 2  0 0  Fall  Risk Category (Retired) [employer]     (RETIRED) Patient Fall Risk Level  Low fall risk     Patient at Risk for Falls Due to  No Fall Risks  No Fall Risks   Fall risk Follow up  Falls evaluation completed  Falls evaluation completed Falls evaluation completed;Education provided     SUMMARY AND PLAN:  Encounter for Medicare annual wellness exam   Discussed applicable health maintenance/preventive health measures and advised and referred or ordered per patient preferences: Discussed vaccine due and answered questions, she is considering getting at the pharmacy, advised to let us know if she does so that we can update chart -had bone density last year Health Maintenance  Topic Date Due   Pneumonia Vaccine 66+ Years old (3 of 3 - PPSV23 or PCV20) 10/22/2016   COVID-19 Vaccine (9 - Pfizer risk 2024-25 season) 02/05/2024   INFLUENZA VACCINE  05/22/2024   Medicare Annual Wellness (AWV)  01/20/2025   DTaP/Tdap/Td (2 - Td or Tdap) 04/05/2025   MAMMOGRAM  04/23/2025   Colonoscopy  08/14/2028   DEXA SCAN  Completed   Hepatitis C Screening  Completed   Zoster Vaccines- Shingrix  Completed   HPV VACCINES  Aged [employer] and counseling on the following was provided based on the above review of health and a plan/checklist for the patient, along with additional information discussed, was provided for the patient in the patient instructions :  -Advised and counseled on a healthy lifestyle - including the importance of a healthy diet, regular physical activity, social connections and stress management. -Reviewed patient's current diet. Advised and counseled on a whole foods based healthy diet. A summary of a healthy diet was provided in the Patient Instructions.  -reviewed patient's current physical activity level and discussed exercise guidelines for adults. Discussed community resources and ideas for safe exercise at home to assist in meeting exercise guideline recommendations in a safe  and healthy way.  -Advise yearly dental visits at minimum and regular eye exams   Follow up: see patient instructions     Patient Instructions  I really enjoyed getting to talk with you today! I am available on Tuesdays and Thursdays for virtual visits if you have any questions or concerns, or if I can be of any further assistance.   CHECKLIST FROM ANNUAL WELLNESS VISIT:  -Follow up (please call to schedule if not scheduled after visit):   -yearly for annual wellness  visit with primary care office  Here is a list of your preventive care/health maintenance measures and the plan for each if any are due:  PLAN For any measures below that may be due:   Health Maintenance  Topic Date Due   Pneumonia Vaccine 86+ Years old (3 of 3 - PPSV23 or PCV20) 10/22/2016, can get at the pharmacy, if you do please let us know date, vaccine, pharmacist, location so that we can update your record. Thanks!   COVID-19 Vaccine (9 - Pfizer risk 2024-25 season) 02/05/2024   INFLUENZA VACCINE  05/22/2024   Medicare Annual Wellness (AWV)  01/20/2025   DTaP/Tdap/Td (2 - Td or Tdap) 04/05/2025   MAMMOGRAM  04/23/2025   Colonoscopy  08/14/2028   DEXA SCAN  Completed   Hepatitis C Screening  Completed   Zoster Vaccines- Shingrix  Completed   HPV VACCINES  Aged Out    -See a dentist at least yearly  -Get your eyes checked and then per your eye specialist's recommendations  -Other issues addressed today:   -I have included below further information regarding a healthy whole foods based diet, physical activity guidelines for adults, stress management and opportunities for social connections. I hope you find this information useful.   -----------------------------------------------------------------------------------------------------------------------------------------------------------------------------------------------------------------------------------------------------------    NUTRITION: -eat  real food: lots of colorful vegetables (half the plate) and fruits -5-7 servings of vegetables and fruits per day (fresh or steamed is best), exp. 2 servings of vegetables with lunch and dinner and 2 servings of fruit per day. Berries and greens such as kale and collards are great choices.  -consume on a regular basis:  fresh fruits, fresh veggies, fish, nuts, seeds, healthy oils (such as olive oil, avocado oil), whole grains (make sure for bread/pasta/crackers/etc., that the first ingredient on label contains the word "whole"), legumes. -can eat small amounts of dairy and lean meat (no larger than the palm of your hand), but avoid processed meats such as ham, bacon, lunch meat, etc. -drink water -try to avoid fast food and pre-packaged foods, processed meat, ultra processed foods/beverages (donuts, candy, etc.) -most experts advise limiting sodium to < 2300mg  per day, should limit further is any chronic conditions such as high blood pressure, heart disease, diabetes, etc. The American Heart Association advised that < 1500mg  is is ideal -try to avoid foods/beverages that contain any ingredients with names you do not recognize  -try to avoid foods/beverages  with added sugar or sweeteners/sweets  -try to avoid sweet drinks (including diet drinks): soda, juice, Gatorade, sweet tea, power drinks, diet drinks -try to avoid white rice, white bread, pasta (unless whole grain)  EXERCISE GUIDELINES FOR ADULTS: -if you wish to increase your physical activity, do so gradually and with the approval of your doctor -STOP and seek medical care immediately if you have any chest pain, chest discomfort or trouble breathing when starting or increasing exercise  -move and stretch your body, legs, feet and arms when sitting for long periods -Physical activity guidelines for optimal health in adults: -get at least 150 minutes per week of moderate exercise (can talk, but not sing); this is about 20-30 minutes of  sustained activity 5-7 days per week or two 10-15 minute episodes of sustained activity 5-7 days per week -do some muscle building/resistance training/strength training at least 2 days per week  -balance exercises 3+ days per week:   Stand somewhere where you have something sturdy to hold onto if you lose balance    1) lift up on toes,  then back down, start with 5x per day and work up to 20x   2) stand and lift one leg straight out to the side so that foot is a few inches of the floor, start with 5x each side and work up to 20x each side   3) stand on one foot, start with 5 seconds each side and work up to 20 seconds on each side  If you need ideas or help with getting more active:  -Silver sneakers https://tools.silversneakers.com  -Walk with a Doc: http://www.duncan-[email or web address]/  -try to include resistance (weight lifting/strength building) and balance exercises twice per week: or the following link for ideas: http://castillo-powell.com/  BuyDucts.dk  STRESS MANAGEMENT: -can try meditating, or just sitting quietly with deep breathing while intentionally relaxing all parts of your body for 5 minutes daily -if you need further help with stress, anxiety or depression please follow up with your primary doctor or contact the wonderful folks at [facility]: [phone]  SOCIAL CONNECTIONS: -options in [place] if you wish to engage in more social and exercise related activities:  -Silver sneakers https://tools.silversneakers.com  -Walk with a Doc: http://www.duncan-williams.com/  -Check out the [facility] Active Adults 50+ section on the [place] of [employer] (hiking clubs, book clubs, cards and games, chess, exercise classes, aquatic classes and much more) - see the website for details: https://www.[place].gov/departments/parks-recreation/active-adults50  -YouTube has lots of  exercise videos for different ages and abilities as well  -[facility] (a variety of indoor and outdoor inperson activities for adults). [phone]. [address]  -Virtual Online Classes (a variety of topics): see seniorplanet.org or call [phone]  -consider volunteering at a school, hospice center, church, senior center or elsewhere            Terressa Koyanagi, DO

## 2024-01-21 NOTE — Patient Instructions (Signed)
 I really enjoyed getting to talk with you today! I am available on Tuesdays and Thursdays for virtual visits if you have any questions or concerns, or if I can be of any further assistance.   CHECKLIST FROM ANNUAL WELLNESS VISIT:  -Follow up (please call to schedule if not scheduled after visit):   -yearly for annual wellness visit with primary care office  Here is a list of your preventive care/health maintenance measures and the plan for each if any are due:  PLAN For any measures below that may be due:   Health Maintenance  Topic Date Due   Pneumonia Vaccine 80+ Years old (3 of 3 - PPSV23 or PCV20) 10/22/2016, can get at the pharmacy, if you do please let us know date, vaccine, pharmacist, location so that we can update your record. Thanks!   COVID-19 Vaccine (9 - Pfizer risk 2024-25 season) 02/05/2024   INFLUENZA VACCINE  05/22/2024   Medicare Annual Wellness (AWV)  01/20/2025   DTaP/Tdap/Td (2 - Td or Tdap) 04/05/2025   MAMMOGRAM  04/23/2025   Colonoscopy  08/14/2028   DEXA SCAN  Completed   Hepatitis C Screening  Completed   Zoster Vaccines- Shingrix  Completed   HPV VACCINES  Aged Out    -See a dentist at least yearly  -Get your eyes checked and then per your eye specialist's recommendations  -Other issues addressed today:   -I have included below further information regarding a healthy whole foods based diet, physical activity guidelines for adults, stress management and opportunities for social connections. I hope you find this information useful.   -----------------------------------------------------------------------------------------------------------------------------------------------------------------------------------------------------------------------------------------------------------    NUTRITION: -eat real food: lots of colorful vegetables (half the plate) and fruits -5-7 servings of vegetables and fruits per day (fresh or steamed is best), exp. 2  servings of vegetables with lunch and dinner and 2 servings of fruit per day. Berries and greens such as kale and collards are great choices.  -consume on a regular basis:  fresh fruits, fresh veggies, fish, nuts, seeds, healthy oils (such as olive oil, avocado oil), whole grains (make sure for bread/pasta/crackers/etc., that the first ingredient on label contains the word "whole"), legumes. -can eat small amounts of dairy and lean meat (no larger than the palm of your hand), but avoid processed meats such as ham, bacon, lunch meat, etc. -drink water -try to avoid fast food and pre-packaged foods, processed meat, ultra processed foods/beverages (donuts, candy, etc.) -most experts advise limiting sodium to < 2300mg  per day, should limit further is any chronic conditions such as high blood pressure, heart disease, diabetes, etc. The American Heart Association advised that < 1500mg  is is ideal -try to avoid foods/beverages that contain any ingredients with names you do not recognize  -try to avoid foods/beverages  with added sugar or sweeteners/sweets  -try to avoid sweet drinks (including diet drinks): soda, juice, Gatorade, sweet tea, power drinks, diet drinks -try to avoid white rice, white bread, pasta (unless whole grain)  EXERCISE GUIDELINES FOR ADULTS: -if you wish to increase your physical activity, do so gradually and with the approval of your doctor -STOP and seek medical care immediately if you have any chest pain, chest discomfort or trouble breathing when starting or increasing exercise  -move and stretch your body, legs, feet and arms when sitting for long periods -Physical activity guidelines for optimal health in adults: -get at least 150 minutes per week of moderate exercise (can talk, but not sing); this is about 20-30 minutes of sustained activity  5-7 days per week or two 10-15 minute episodes of sustained activity 5-7 days per week -do some muscle building/resistance  training/strength training at least 2 days per week  -balance exercises 3+ days per week:   Stand somewhere where you have something sturdy to hold onto if you lose balance    1) lift up on toes, then back down, start with 5x per day and work up to 20x   2) stand and lift one leg straight out to the side so that foot is a few inches of the floor, start with 5x each side and work up to 20x each side   3) stand on one foot, start with 5 seconds each side and work up to 20 seconds on each side  If you need ideas or help with getting more active:  -Silver sneakers https://tools.silversneakers.com  -Walk with a Doc: http://www.duncan-williams.com/  -try to include resistance (weight lifting/strength building) and balance exercises twice per week: or the following link for ideas: http://castillo-powell.com/  BuyDucts.dk  STRESS MANAGEMENT: -can try meditating, or just sitting quietly with deep breathing while intentionally relaxing all parts of your body for 5 minutes daily -if you need further help with stress, anxiety or depression please follow up with your primary doctor or contact the wonderful folks at [facility]: [phone]  SOCIAL CONNECTIONS: -options in [place] if you wish to engage in more social and exercise related activities:  -Silver sneakers https://tools.silversneakers.com  -Walk with a Doc: http://www.duncan-[email or web address]/  -Check out the [facility] Active Adults 50+ section on the [place] [employer] (hiking clubs, book clubs, cards and games, chess, exercise classes, aquatic classes and much more) - see the website for details: https://www.[place].gov/departments/parks-recreation/active-adults50  -YouTube has lots of exercise videos for different ages and abilities as well  -[facility] (a variety of indoor and outdoor inperson activities for  adults). [phone]. [address]  -Virtual Online Classes (a variety of topics): see seniorplanet.org or call [phone]  -consider volunteering at a school, hospice center, church, senior center or elsewhere

## 2024-02-13 ENCOUNTER — Ambulatory Visit: Admitting: Bariatrics

## 2024-02-27 ENCOUNTER — Ambulatory Visit: Admitting: Bariatrics

## 2024-02-27 ENCOUNTER — Encounter: Payer: Self-pay | Admitting: Bariatrics

## 2024-02-27 VITALS — BP 131/78 | HR 65 | Temp 97.9°F | Ht 64.0 in | Wt 194.0 lb

## 2024-02-27 DIAGNOSIS — E6609 Other obesity due to excess calories: Secondary | ICD-10-CM

## 2024-02-27 DIAGNOSIS — Z6833 Body mass index (BMI) 33.0-33.9, adult: Secondary | ICD-10-CM

## 2024-02-27 DIAGNOSIS — R7303 Prediabetes: Secondary | ICD-10-CM

## 2024-02-27 DIAGNOSIS — E669 Obesity, unspecified: Secondary | ICD-10-CM

## 2024-02-27 NOTE — Progress Notes (Signed)
 WEIGHT SUMMARY AND BIOMETRICS  Weight Lost Since Last Visit: 0  Weight Gained Since Last Visit: 0   Vitals Temp: 97.9 F (36.6 C) BP: 131/78 Pulse Rate: 65 SpO2: 99 %   Anthropometric Measurements Height: 5\' 4"  (1.626 m) Weight: 194 lb (88 kg) BMI (Calculated): 33.28 Weight at Last Visit: 194lb Weight Lost Since Last Visit: 0 Weight Gained Since Last Visit: 0 Starting Weight: 213lb Total Weight Loss (lbs): 19 lb (8.618 kg)   Body Composition  Body Fat %: 49.8 % Fat Mass (lbs): 96.8 lbs Muscle Mass (lbs): 92.6 lbs Total Body Water  (lbs): 70.2 lbs Visceral Fat Rating : 15   Other Clinical Data Fasting: no Labs: no Today's Visit #: 56 Starting Date: 08/04/18    OBESITY Natalie Hall is here to discuss her progress with her obesity treatment plan along with follow-up of her obesity related diagnoses.    Nutrition Plan: the Category 2 plan - 80% adherence.  Current exercise: Silver sneakers  Interim History:  Her weight remains the same.  Protein intake is as prescribed, Is not skipping meals, Not journaling consistently., Water  intake is inadequate., and Denies polyphagia  Hunger is moderately controlled.  Cravings are moderately controlled.  Assessment/Plan:   Prediabetes Last A1c was 6.0  Medication(s): Metformin  500 mg twice daily with meals Lab Results  Component Value Date   HGBA1C 6.0 04/29/2023   HGBA1C 6.0 04/27/2022   HGBA1C 6.1 (H) 10/20/2021   HGBA1C 5.9 04/13/2021   HGBA1C 5.5 12/06/2020   Lab Results  Component Value Date   INSULIN  19.4 04/29/2023   INSULIN  18.6 07/23/2022   INSULIN  16.6 12/06/2020   INSULIN  18.5 11/17/2019   INSULIN  15.4 11/20/2018    Plan: Will minimize all refined carbohydrates both sweets and starches.  Will work on the plan and exercise.  Consider both aerobic and resistance training.  Will keep  protein, water , and fiber intake high.  Increase Polyunsaturated and Monounsaturated fats to increase satiety and encourage weight loss.  Aim for 7 to 9 hours of sleep nightly.  Continue Metformin  500 mg twice daily with meals  She will start to journal on days when she is not eating out and will stick to her 1200 cal and 80 g of protein. She will continue working out at the gym 2 days a week, doing both bands weights and working with her flexibility. Discussed strategies at home to work on flexibility as well.   Generalized Obesity: Current BMI BMI (Calculated): 33.28    Natalie Hall is currently in the action stage of change. As such, her goal is to continue with weight loss efforts.  She has agreed to the Category 2 plan.  Exercise goals: Older adults should determine their level of effort for physical activity relative to their level of fitness.   Behavioral modification strategies: increasing lean protein intake, no meal skipping, increase water  intake,  better snacking choices, planning for success, increasing vegetables, decreasing sodium intake, keep healthy foods in the home, work on smaller portions, and mindful eating.    Natalie Hall has agreed to follow-up with our clinic in 6 weeks.      Objective:   VITALS: Per patient if applicable, see vitals. GENERAL: Alert and in no acute distress. CARDIOPULMONARY: No increased WOB. Speaking in clear sentences.  PSYCH: Pleasant and cooperative. Speech normal rate and rhythm. Affect is appropriate. Insight and judgement are appropriate. Attention is focused, linear, and appropriate.  NEURO: Oriented as arrived to appointment on time with no prompting.   Attestation Statements:   This was prepared with the assistance of Engineer, civil (consulting).  Occasional wrong-word or sound-a-like substitutions may have occurred due to the inherent limitations of voice recognition   Kirk Peper, DO

## 2024-03-06 ENCOUNTER — Other Ambulatory Visit: Payer: Self-pay | Admitting: Family Medicine

## 2024-03-21 ENCOUNTER — Other Ambulatory Visit: Payer: Self-pay | Admitting: Internal Medicine

## 2024-03-21 ENCOUNTER — Other Ambulatory Visit: Payer: Self-pay | Admitting: Family Medicine

## 2024-03-21 DIAGNOSIS — E782 Mixed hyperlipidemia: Secondary | ICD-10-CM

## 2024-03-21 DIAGNOSIS — I10 Essential (primary) hypertension: Secondary | ICD-10-CM

## 2024-04-08 ENCOUNTER — Ambulatory Visit (INDEPENDENT_AMBULATORY_CARE_PROVIDER_SITE_OTHER): Admitting: Bariatrics

## 2024-04-08 ENCOUNTER — Ambulatory Visit: Admitting: Bariatrics

## 2024-04-08 ENCOUNTER — Encounter: Payer: Self-pay | Admitting: Bariatrics

## 2024-04-08 VITALS — BP 142/80 | HR 68 | Temp 97.8°F | Ht 64.0 in | Wt 195.0 lb

## 2024-04-08 DIAGNOSIS — I10 Essential (primary) hypertension: Secondary | ICD-10-CM | POA: Diagnosis not present

## 2024-04-08 DIAGNOSIS — E669 Obesity, unspecified: Secondary | ICD-10-CM | POA: Diagnosis not present

## 2024-04-08 DIAGNOSIS — R7303 Prediabetes: Secondary | ICD-10-CM | POA: Diagnosis not present

## 2024-04-08 DIAGNOSIS — Z6833 Body mass index (BMI) 33.0-33.9, adult: Secondary | ICD-10-CM

## 2024-04-08 DIAGNOSIS — E66811 Obesity, class 1: Secondary | ICD-10-CM

## 2024-04-08 MED ORDER — METFORMIN HCL 500 MG PO TABS: 500.0000 mg | ORAL_TABLET | Freq: Two times a day (BID) | ORAL | 0 refills | Status: DC

## 2024-04-08 NOTE — Progress Notes (Signed)
 WEIGHT SUMMARY AND BIOMETRICS  Weight Lost Since Last Visit: 0  Weight Gained Since Last Visit: 1lb   Vitals Temp: 97.8 F (36.6 C) BP: (!) 142/80 Pulse Rate: 68 SpO2: 98 %   Anthropometric Measurements Height: 5' 4 (1.626 m) Weight: 195 lb (88.5 kg) BMI (Calculated): 33.46 Weight at Last Visit: 194lb Weight Lost Since Last Visit: 0 Weight Gained Since Last Visit: 1lb Starting Weight: 213lb Total Weight Loss (lbs): 18 lb (8.165 kg)   Body Composition  Body Fat %: 49.7 % Fat Mass (lbs): 97.4 lbs Muscle Mass (lbs): 93.4 lbs Total Body Water  (lbs): 70 lbs Visceral Fat Rating : 15   Other Clinical Data Fasting: no Labs: no Today's Visit #: 97 Starting Date: 08/04/18    OBESITY Natalie Hall is here to discuss her progress with her obesity treatment plan along with follow-up of her obesity related diagnoses.    Nutrition Plan: the Category 2 plan - 60% adherence.  Current exercise: Silver sneakers.   Interim History:  She is up 1 lb since her last visit. She  Eating all of the food on the plan., Protein intake is as prescribed, Is not skipping meals, and Water  intake is adequate.   Pharmacotherapy: Natalie Hall is on Metformin  500 mg twice daily with meals Adverse side effects: None Hunger is moderately controlled.  Cravings are moderately controlled.  Assessment/Plan:   Prediabetes Last A1c was 6.0  Medication(s): Metformin  500 mg twice daily with meals Lab Results  Component Value Date   HGBA1C 6.0 04/29/2023   HGBA1C 6.0 04/27/2022   HGBA1C 6.1 (H) 10/20/2021   HGBA1C 5.9 04/13/2021   HGBA1C 5.5 12/06/2020   Lab Results  Component Value Date   INSULIN  19.4 04/29/2023   INSULIN  18.6 07/23/2022   INSULIN  16.6 12/06/2020   INSULIN  18.5 11/17/2019   INSULIN  15.4 11/20/2018    Plan: Will minimize all refined carbohydrates both sweets and  starches.  Will work on the plan and exercise.  Consider both aerobic and resistance training.  Will keep protein, water , and fiber intake high.  Increase Polyunsaturated and Monounsaturated fats to increase satiety and encourage weight loss.  Continue and refill Metformin  500 mg twice daily with meals  Hypertension Hypertension stable.  Medication(s): Altace  10 mg, metoprolol  25 mg XL.   BP Readings from Last 3 Encounters:  04/08/24 (!) 142/80  02/27/24 131/78  01/16/24 (!) 151/83   Lab Results  Component Value Date   CREATININE 0.70 11/18/2023   CREATININE 0.67 04/29/2023   CREATININE 0.73 07/23/2022   Lab Results  Component Value Date   GFR 86.98 04/29/2023   GFR 88.57 04/27/2022   GFR 89.56 04/13/2021    Plan: Continue all antihypertensives at current dosages. No added salt. Will keep sodium content to 1,500 mg or less per day.   She will periodically check her blood pressure.  Information sheets for  protein and carbohydrates.    Generalized Obesity: Current BMI BMI (Calculated): 33.46    Natalie Hall is currently in the action stage of change. As such, her goal is to continue with weight loss efforts.  She has agreed to the Category 2 plan.  Exercise goals: Older adults should follow the adult guidelines. When older adults cannot meet the adult guidelines, they should be as physically active as their abilities and conditions will allow.   Behavioral modification strategies: increasing lean protein intake, decrease eating out, meal planning , increase water  intake, better snacking choices, planning for success, decrease snacking , and weigh protein portions.  Natalie Hall has agreed to follow-up with our clinic in 4 weeks.     Objective:   VITALS: Per patient if applicable, see vitals. GENERAL: Alert and in no acute distress. CARDIOPULMONARY: No increased WOB. Speaking in clear sentences.  PSYCH: Pleasant and cooperative. Speech normal rate and rhythm. Affect is  appropriate. Insight and judgement are appropriate. Attention is focused, linear, and appropriate.  NEURO: Oriented as arrived to appointment on time with no prompting.   Attestation Statements:   This was prepared with the assistance of Engineer, civil (consulting).  Occasional wrong-word or sound-a-like substitutions may have occurred due to the inherent limitations of voice recognition   Kirk Peper, DO

## 2024-04-27 LAB — HM MAMMOGRAPHY

## 2024-04-30 ENCOUNTER — Encounter: Payer: Self-pay | Admitting: Family Medicine

## 2024-05-04 ENCOUNTER — Encounter: Payer: Self-pay | Admitting: Family Medicine

## 2024-05-04 ENCOUNTER — Ambulatory Visit (INDEPENDENT_AMBULATORY_CARE_PROVIDER_SITE_OTHER): Admitting: Family Medicine

## 2024-05-04 VITALS — BP 128/62 | HR 66 | Temp 98.4°F | Wt 200.0 lb

## 2024-05-04 DIAGNOSIS — I10 Essential (primary) hypertension: Secondary | ICD-10-CM

## 2024-05-04 DIAGNOSIS — I471 Supraventricular tachycardia, unspecified: Secondary | ICD-10-CM

## 2024-05-04 DIAGNOSIS — J45909 Unspecified asthma, uncomplicated: Secondary | ICD-10-CM

## 2024-05-04 DIAGNOSIS — F3289 Other specified depressive episodes: Secondary | ICD-10-CM

## 2024-05-04 DIAGNOSIS — E785 Hyperlipidemia, unspecified: Secondary | ICD-10-CM | POA: Diagnosis not present

## 2024-05-04 DIAGNOSIS — E559 Vitamin D deficiency, unspecified: Secondary | ICD-10-CM

## 2024-05-04 DIAGNOSIS — R7303 Prediabetes: Secondary | ICD-10-CM

## 2024-05-04 DIAGNOSIS — M8580 Other specified disorders of bone density and structure, unspecified site: Secondary | ICD-10-CM

## 2024-05-04 DIAGNOSIS — C6992 Malignant neoplasm of unspecified site of left eye: Secondary | ICD-10-CM

## 2024-05-04 DIAGNOSIS — K219 Gastro-esophageal reflux disease without esophagitis: Secondary | ICD-10-CM

## 2024-05-04 LAB — VITAMIN D 25 HYDROXY (VIT D DEFICIENCY, FRACTURES): VITD: 70.88 ng/mL (ref 30.00–100.00)

## 2024-05-04 LAB — BASIC METABOLIC PANEL WITH GFR
BUN: 12 mg/dL (ref 6–23)
CO2: 28 meq/L (ref 19–32)
Calcium: 9.4 mg/dL (ref 8.4–10.5)
Chloride: 98 meq/L (ref 96–112)
Creatinine, Ser: 0.67 mg/dL (ref 0.40–1.20)
GFR: 86.36 mL/min (ref >60.00)
Glucose, Bld: 90 mg/dL (ref 70–99)
Potassium: 4.1 meq/L (ref 3.5–5.1)
Sodium: 133 meq/L — ABNORMAL LOW (ref 135–145)

## 2024-05-04 LAB — LIPID PANEL
Cholesterol: 96 mg/dL (ref 0–200)
HDL: 33.2 mg/dL — ABNORMAL LOW (ref >39.00)
LDL Cholesterol: 38 mg/dL (ref 0–99)
NonHDL: 63.22
Total CHOL/HDL Ratio: 3
Triglycerides: 125 mg/dL (ref 0.0–149.0)
VLDL: 25 mg/dL (ref 0.0–40.0)

## 2024-05-04 LAB — CBC WITH DIFFERENTIAL/PLATELET
Basophils Absolute: 0 K/uL (ref 0.0–0.1)
Basophils Relative: 0.4 % (ref 0.0–3.0)
Eosinophils Absolute: 0.2 K/uL (ref 0.0–0.7)
Eosinophils Relative: 4.6 % (ref 0.0–5.0)
HCT: 38.5 % (ref 36.0–46.0)
Hemoglobin: 12.7 g/dL (ref 12.0–15.0)
Lymphocytes Relative: 25.9 % (ref 12.0–46.0)
Lymphs Abs: 1.2 K/uL (ref 0.7–4.0)
MCHC: 33 g/dL (ref 30.0–36.0)
MCV: 84.6 fl (ref 78.0–100.0)
Monocytes Absolute: 0.4 K/uL (ref 0.1–1.0)
Monocytes Relative: 9.4 % (ref 3.0–12.0)
Neutro Abs: 2.8 K/uL (ref 1.4–7.7)
Neutrophils Relative %: 59.7 % (ref 43.0–77.0)
Platelets: 190 K/uL (ref 150.0–400.0)
RBC: 4.56 Mil/uL (ref 3.87–5.11)
RDW: 14.8 % (ref 11.5–15.5)
WBC: 4.6 K/uL (ref 4.0–10.5)

## 2024-05-04 LAB — HEPATIC FUNCTION PANEL
ALT: 26 U/L (ref 0–35)
AST: 26 U/L (ref 0–37)
Albumin: 4.2 g/dL (ref 3.5–5.2)
Alkaline Phosphatase: 31 U/L — ABNORMAL LOW (ref 39–117)
Bilirubin, Direct: 0.1 mg/dL (ref 0.0–0.3)
Total Bilirubin: 0.5 mg/dL (ref 0.2–1.2)
Total Protein: 7.4 g/dL (ref 6.0–8.3)

## 2024-05-04 LAB — HEMOGLOBIN A1C: Hgb A1c MFr Bld: 6.3 % (ref 4.6–6.5)

## 2024-05-04 LAB — TSH: TSH: 1.04 u[IU]/mL (ref 0.35–5.50)

## 2024-05-04 MED ORDER — LORAZEPAM 0.5 MG PO TABS: 0.5000 mg | ORAL_TABLET | Freq: Three times a day (TID) | ORAL | 2 refills | Status: DC | PRN

## 2024-05-04 MED ORDER — ALBUTEROL SULFATE HFA 108 (90 BASE) MCG/ACT IN AERS: 2.0000 | INHALATION_SPRAY | RESPIRATORY_TRACT | 5 refills | Status: AC | PRN

## 2024-05-04 NOTE — Progress Notes (Signed)
 Subjective:    Patient ID: Natalie Hall, female    DOB: [birth year], 73 y.o.   MRN: [id number]  HPI Here to follow up on issues. She has some concerns about her BP. Over the past few weeks she has had readings averaging 140/90. She attributes this to stress. She has felt very anxious recently due to dealing with her 40 year old mother who has dementia. She visits her mother ar a local assisted living facility frequently, and she speak to her on the phone every day. She sleeps well using the Temazepam . Her asthma has been stable. She also complains of frequent reflux of stomach contents into the back of her mouth after eating a meal. This began several months ago. It never happens during the night. She denies any trouble swallowing or heartburn. She takes Omeprazole  every morning.    Review of Systems  Constitutional: Negative.   HENT: Negative.    Eyes: Negative.   Respiratory: Negative.    Cardiovascular: Negative.   Gastrointestinal: Negative.   Genitourinary:  Negative for decreased urine volume, difficulty urinating, dyspareunia, dysuria, enuresis, flank pain, frequency, hematuria, pelvic pain and urgency.  Musculoskeletal: Negative.   Skin: Negative.   Neurological: Negative.  Negative for headaches.  Psychiatric/Behavioral:  Positive for dysphoric mood and sleep disturbance. The patient is nervous/anxious.        Objective:   Physical Exam Constitutional:      General: She is not in acute distress.    Appearance: She is well-developed. She is obese.  HENT:     Head: Normocephalic and atraumatic.     Right Ear: External ear normal.     Left Ear: External ear normal.     Nose: Nose normal.     Mouth/Throat:     Pharynx: No oropharyngeal exudate.  Eyes:     General: No scleral icterus.    Conjunctiva/sclera: Conjunctivae normal.     Pupils: Pupils are equal, round, and reactive to light.  Neck:     Thyroid : No thyromegaly.     Vascular: No JVD.  Cardiovascular:      Rate and Rhythm: Normal rate and regular rhythm.     Pulses: Normal pulses.     Heart sounds: Normal heart sounds. No murmur heard.    No friction rub. No gallop.  Pulmonary:     Effort: Pulmonary effort is normal. No respiratory distress.     Breath sounds: Normal breath sounds. No wheezing or rales.  Chest:     Chest Penning: No tenderness.  Abdominal:     General: Bowel sounds are normal. There is no distension.     Palpations: Abdomen is soft. There is no mass.     Tenderness: There is no abdominal tenderness. There is no guarding or rebound.  Musculoskeletal:        General: No tenderness. Normal range of motion.     Cervical back: Normal range of motion and neck supple.  Lymphadenopathy:     Cervical: No cervical adenopathy.  Skin:    General: Skin is warm and dry.     Findings: No erythema or rash.  Neurological:     General: No focal deficit present.     Mental Status: She is alert and oriented to person, place, and time.     Cranial Nerves: No cranial nerve deficit.     Motor: No abnormal muscle tone.     Coordination: Coordination normal.     Deep Tendon Reflexes: Reflexes are normal and  symmetric. Reflexes normal.  Psychiatric:        Behavior: Behavior normal.        Thought Content: Thought content normal.        Judgment: Judgment normal.     Comments: She is anxious            Assessment & Plan:  Her HTN has been fairly well controlled, but she has had a lot of anxiety recently and this has been bumping up her BP. We will give her some Lorazepam  0.5 mg to take as needed. She has a lot of esophageal reflux so we will refer her to GI for a possible upper endoscopy. Her last DEXA was last year. Get labs to check an A1c, etc. We spent a total of (34  ) minutes reviewing records and discussing these issues.  Garnette Olmsted, MD

## 2024-05-06 ENCOUNTER — Ambulatory Visit

## 2024-05-06 ENCOUNTER — Ambulatory Visit (INDEPENDENT_AMBULATORY_CARE_PROVIDER_SITE_OTHER): Admitting: Bariatrics

## 2024-05-06 ENCOUNTER — Encounter: Payer: Self-pay | Admitting: Bariatrics

## 2024-05-06 ENCOUNTER — Other Ambulatory Visit

## 2024-05-06 ENCOUNTER — Ambulatory Visit: Payer: Self-pay | Admitting: Family Medicine

## 2024-05-06 VITALS — BP 137/77 | HR 68 | Temp 97.9°F | Ht 64.0 in | Wt 196.0 lb

## 2024-05-06 DIAGNOSIS — I10 Essential (primary) hypertension: Secondary | ICD-10-CM | POA: Diagnosis not present

## 2024-05-06 DIAGNOSIS — Z6833 Body mass index (BMI) 33.0-33.9, adult: Secondary | ICD-10-CM

## 2024-05-06 DIAGNOSIS — E669 Obesity, unspecified: Secondary | ICD-10-CM

## 2024-05-06 DIAGNOSIS — E6609 Other obesity due to excess calories: Secondary | ICD-10-CM

## 2024-05-06 DIAGNOSIS — C6992 Malignant neoplasm of unspecified site of left eye: Secondary | ICD-10-CM | POA: Diagnosis not present

## 2024-05-06 NOTE — Progress Notes (Signed)
 WEIGHT SUMMARY AND BIOMETRICS  Weight Lost Since Last Visit: 0  Weight Gained Since Last Visit: 1lb   Vitals Temp: 97.9 F (36.6 C) BP: 137/77 Pulse Rate: 68 SpO2: 99 %   Anthropometric Measurements Height: 5' 4 (1.626 m) Weight: 196 lb (88.9 kg) BMI (Calculated): 33.63 Weight at Last Visit: 195lb Weight Lost Since Last Visit: 0 Weight Gained Since Last Visit: 1lb Starting Weight: 213lb Total Weight Loss (lbs): 17 lb (7.711 kg)   Body Composition  Body Fat %: 49.7 % Fat Mass (lbs): 97.4 lbs Muscle Mass (lbs): 93.6 lbs Total Body Water  (lbs): 70.4 lbs Visceral Fat Rating : 15   Other Clinical Data Fasting: no Labs: no Today's Visit #: 53 Starting Date: 08/04/18    OBESITY Natalie Hall is here to discuss her progress with her obesity treatment plan along with follow-up of her obesity related diagnoses.    Nutrition Plan: the Category 2 plan - 80% adherence.  Current exercise: none  Interim History:  She is up 1 lb since her last visit.  Eating all of the food on the plan., Protein intake is as prescribed, and Water  intake is adequate.  Hunger is moderately controlled.  Cravings are moderately controlled.  Assessment/Plan:   Hypertension Hypertension stable.  Medication(s): Altace  10 mg 1 daily, Toprol -XR 25 mg daily  BP Readings from Last 3 Encounters:  05/06/24 137/77  05/04/24 128/62  04/08/24 (!) 142/80   Lab Results  Component Value Date   CREATININE 0.67 05/04/2024   CREATININE 0.70 11/18/2023   CREATININE 0.67 04/29/2023   Lab Results  Component Value Date   GFR 86.36 05/04/2024   GFR 86.98 04/29/2023   GFR 88.57 04/27/2022    Plan: Continue all antihypertensives at current dosages. No added salt. Will keep sodium content to 1,500 mg or less per day.   Information sheets for healthier protein and carbohydrates. She will  increase her fruits and vegetables.    Generalized Obesity: Current BMI BMI (Calculated): 33.63   Pharmacotherapy Plan Continue  Metformin  500 mg twice daily with meals  Natalie Hall is currently in the action stage of change. As such, her goal is to continue with weight loss efforts.  She has agreed to the Category 2 plan.  Exercise goals: Older adults should determine their level of effort for physical activity relative to their level of fitness.   Behavioral modification strategies: increasing lean protein intake, no meal skipping, meal planning , avoiding temptations, keep healthy foods in the home, weigh protein portions, measure portion sizes, and mindful eating.  Natalie Hall has agreed to follow-up with our clinic in 4 weeks.    Objective:   VITALS: Per patient if applicable, see vitals. GENERAL: Alert and in no acute distress. CARDIOPULMONARY: No increased WOB. Speaking in clear sentences.  PSYCH: Pleasant and cooperative. Speech normal rate and rhythm. Affect is appropriate. Insight and judgement are  appropriate. Attention is focused, linear, and appropriate.  NEURO: Oriented as arrived to appointment on time with no prompting.   Attestation Statements:   This was prepared with the assistance of Engineer, civil (consulting).  Occasional wrong-word or sound-a-like substitutions may have occurred due to the inherent limitations of voice recognition   Clayborne Daring, DO

## 2024-05-11 ENCOUNTER — Ambulatory Visit
Admission: RE | Admit: 2024-05-11 | Discharge: 2024-05-11 | Disposition: A | Discharge status: Home / Self Care | Source: Ambulatory Visit | Attending: Family Medicine | Admitting: Family Medicine

## 2024-05-11 DIAGNOSIS — C6992 Malignant neoplasm of unspecified site of left eye: Secondary | ICD-10-CM

## 2024-05-13 ENCOUNTER — Encounter: Payer: Self-pay | Admitting: Gastroenterology

## 2024-05-13 ENCOUNTER — Ambulatory Visit (INDEPENDENT_AMBULATORY_CARE_PROVIDER_SITE_OTHER): Admitting: Gastroenterology

## 2024-05-13 VITALS — BP 132/70 | HR 69 | Ht 64.0 in | Wt 201.0 lb

## 2024-05-13 DIAGNOSIS — R140 Abdominal distension (gaseous): Secondary | ICD-10-CM | POA: Diagnosis not present

## 2024-05-13 DIAGNOSIS — K219 Gastro-esophageal reflux disease without esophagitis: Secondary | ICD-10-CM | POA: Diagnosis not present

## 2024-05-13 NOTE — Patient Instructions (Signed)
 You have been scheduled for an endoscopy. Please follow written instructions given to you at your visit today.  If you use inhalers (even only as needed), please bring them with you on the day of your procedure.  If you take any of the following medications, they will need to be adjusted prior to your procedure:   DO NOT TAKE 7 DAYS PRIOR TO TEST- Trulicity (dulaglutide) Ozempic , Wegovy (semaglutide ) Mounjaro (tirzepatide) Bydureon Bcise (exanatide extended release)  DO NOT TAKE 1 DAY PRIOR TO YOUR TEST Rybelsus (semaglutide ) Adlyxin (lixisenatide) Victoza (liraglutide) Byetta (exanatide) ___________________________________________________________________________   _______________________________________________________  If your blood pressure at your visit was 140/90 or greater, please contact your primary care physician to follow up on this.  _______________________________________________________  If you are age 74 or older, your body mass index should be between 23-30. Your Body mass index is 34.5 kg/m. If this is out of the aforementioned range listed, please consider follow up with your Primary Care Provider.  If you are age 74 or younger, your body mass index should be between 19-25. Your Body mass index is 34.5 kg/m. If this is out of the aformentioned range listed, please consider follow up with your Primary Care Provider.   ________________________________________________________  The [facility] providers would like to encourage you to use MYCHART to communicate with providers for non-urgent requests or questions.  Due to long hold times on the telephone, sending your provider a message by [facility] may be a faster and more efficient way to get a response.  Please allow 48 business hours for a response.  Please remember that this is for non-urgent requests.  _______________________________________________________  [facility] is using a team-based approach to  care.  Your team is made up of your doctor and two to three APPS. Our APPS (Nurse Practitioners and Physician Assistants) work with your physician to ensure care continuity for you. They are fully qualified to address your health concerns and develop a treatment plan. They communicate directly with your gastroenterologist to care for you. Seeing the Advanced Practice Practitioners on your physician's team can help you by facilitating care more promptly, often allowing for earlier appointments, access to diagnostic testing, procedures, and other specialty referrals.    Thank you for trusting me with your gastrointestinal care!    Dr. Victory Legrand [facility]

## 2024-05-13 NOTE — Progress Notes (Signed)
 [facility] Consult Note:  History: Natalie Hall 05/13/2024  Referring provider: Johnny Hall LABOR, Hall  Reason for consult/chief complaint: Gastroesophageal Reflux (Pt states the acid reflux has gotten worst, pt states a lot of foods do not stay down the food just comes back up, pt been having problems for several years.) and Gas (Pt states she is having problems with gas)   Subjective  Prior history:  Previous patient of Natalie Hall, saw him for routine colonoscopies.  Most recently October 2022 (at age 77) with 3 diminutive polyps (2 TA, 1 HP).  7-year recall recommended  Cardiac event 2017 (detailed below)  Discussed the use of AI scribe software for clinical note transcription with the patient, who gave verbal consent to proceed.  History of Present Illness Natalie Hall is a 72 year old female who presents with worsening reflux symptoms.  She has been experiencing regurgitation of food, particularly after consuming raw vegetables and dairy, for the past one to two years. The sensation is described as food 'coming back up' rather than vomiting, and it has worsened over the past year. She experiences heartburn and bloating, especially after meals, and takes omeprazole  for acid reduction, which she has been on for years. Gas-X is used for bloating, providing some relief.  No sensation of food getting stuck in her chest and she does not wake up at night with regurgitation, but feels bloated before sleep.  Her mother has a history of esophageal issues requiring dilation, but she does not experience similar symptoms of food getting stuck.  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 She also had a cardiac stent placed and an ablation in 2017, with no subsequent cardiac issues.  She has periodic heartburn from the PPI that she has been  on for years, and feels that it is generally well-controlled on the medicine.  It is the regurgitation that has worsened in about the last year or so that is particularly troubling her   ROS:  Review of Systems  Constitutional:  Negative for appetite change and unexpected weight change.  HENT:  Negative for mouth sores and voice change.   Eyes:  Negative for pain and redness.  Respiratory:  Negative for cough and shortness of breath.   Cardiovascular:  Negative for chest pain and palpitations.  Genitourinary:  Negative for dysuria and hematuria.  Musculoskeletal:  Negative for arthralgias and myalgias.  Skin:  Negative for pallor and rash.  Neurological:  Negative for weakness and headaches.  Hematological:  Negative for adenopathy.     Past Medical History: Past Medical History:  Diagnosis Date   Asthma    followe by pcp'  uncomplicated,  rescue inhaler prn   Chronic constipation    Chronic low back pain    Coronary artery disease 2017   cardiologist--- Natalie Hall;   NSTEMI  w/ positive NUC 03-13-2016 in setting 8 hrs SVT;  cath 03-14-2016  w/ PCI and DES to mid to distal RCA,  normal LVEF   Fatty liver    followed by pcp  ;  in epic abd ultrasound 05-15-2023   GERD (gastroesophageal reflux disease)    Hiatal hernia    History of non-ST elevation myocardial infarction (NSTEMI) 03/11/2016   Hx of adenomatous colonic polyps    Hyperlipidemia, mixed    Hypertension    Malignant melanoma of eye, left (  HCC) 2017   followed by Natalie Hall ([facility])   s/p I-125 radioactive plaque application 09-17-2016 and removal 09-21-2026; s/p avatin x8 07-04-2023   OAB (overactive bladder)    Osteoarthritis    Osteopenia    Paroxysmal SVT (supraventricular tachycardia) (HCC)    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 female who I last saw in the hospital a few weeks ago. She was admitted for an SVT which spontaneously converted in the ER on Cardizem  and did not return. Unfortunately she had a non-ST elevation MI with troponin peak of 1.64. She underwent a Lexiscan  Myoview  which demonstrated a moderate region of reversibility in the mid and apical segments of the inferior lateral Helt consistent with ischemia. EF was 61%. She then was referred to cardiac catheterization which demonstrated the following:   Cardiac Cath 05/24   Prox RCA lesion, 25% stenosed. The left ventricular systolic function is normal. Prox LAD to Mid LAD lesion, 15% stenosed. Mid RCA-1 lesion, 95% stenosed. Mid RCA-2 lesion, 95% stenosed. Post intervention, there is a 0% residual stenosis. SYNERGY DES 3X32 1. Single vessel obstructive CAD 2. Normal LV function 3. Successful stenting of the mid to distal RCA with a DES, SYNERGY DES 3X32 Plan: DAPT for one year. Patient may be a candidate for the TWILIGHT trial in which case study protocol will be followed. Anticipate DC in am.   Natalie Hall returns today and is doing quite well. She denies any chest pain or worsening shortness of breath. She is compliant with her medications. She did enroll in the TWILIGHT trial. Unfortunately, she was considering shoulder surgery but this will be delayed due to the necessity of dual antiplatelet therapy with her recent stent.   However - this note is formatted/worded to sound like event was in 2025, when it actually occurred in 2017   Past Surgical History: Past Surgical History:  Procedure Laterality Date   ABDOMINAL HYSTERECTOMY  2000   W/  UNILATERAL SALPINOOPHORECTOMY   ACHILLES TENDON REPAIR Right 10/07/2011   per Natalie Hall SURGERY Left 07/05/2003   @[facility]  by Natalie Natalie Hall;   peroneal tendon repair   BIOPSY  11/18/2023   Procedure: BLADDER  BIOPSY;  Surgeon: Natalie Hall;  Location: [facility];  Service: Gynecology;;   BLADDER SUSPENSION N/A 11/18/2023   Procedure: TRANSVAGINAL TAPE (TVT) PROCEDURE--Altis Sling;  Surgeon: Natalie Hall;  Location[facility];  Service: Gynecology;  Laterality: N/A;   CARDIAC CATHETERIZATION N/A 03/14/2016   Procedure: Left Heart Cath and Coronary Angiography;  Surgeon: Peter M Swaziland, Hall;  Location: [facility] INVASIVE CV LAB;  Service: Cardiovascular;  Laterality: N/A;   CARDIAC CATHETERIZATION N/A 03/14/2016   Procedure: Coronary Stent Intervention;  Surgeon: Peter M Swaziland, Hall;  Location: [facility] INVASIVE CV LAB;  Service: Cardiovascular;  Laterality: N/A;   CARPAL TUNNEL RELEASE Left 04/01/2001   @ [facility] by Natalie sypher;   And left thumb pulley release   CATARACT EXTRACTION W/ INTRAOCULAR LENS IMPLANT Bilateral 2014   COLONOSCOPY  08/14/2021   per Natalie Hall, precancerous polyps, repeat in 7 yrs   COLONOSCOPY WITH PROPOFOL   08/14/2021   by Natalie d. Hall  COLPOCLEISIS N/A 11/18/2023   Procedure: COLPOCLEISIS;  Surgeon: Natalie Hall;  Location: [facility];  Service: Gynecology;  Laterality: N/A;   CYSTOSCOPY N/A 11/18/2023   Procedure: CYSTOSCOPY;  Surgeon: Natalie Hall;  Location: [facility];  Service: Gynecology;  Laterality: N/A;   ELECTROPHYSIOLOGIC STUDY N/A 05/21/2016   Procedure: SVT Ablation;  Surgeon: Will Gladis Norton, Hall;  Location: [facility] INVASIVE CV LAB;  Service: Cardiovascular;  Laterality: N/A;   EXCISION HAGLUND'S DEFORMITY WITH ACHILLES TENDON REPAIR Left 07/02/2019   Procedure: Left Achilles tendon debridement and reconstruction and excision of Haglund deformity;  Surgeon: Kit Rush, Hall;  Location: [facility];  Service: Orthopedics;  Laterality: Left;   GASTROCNEMIUS RECESSION Left 07/02/2019   Procedure: Gastroc recession;  Surgeon: Kit Rush, Hall;  Location:  [facility];  Service: Orthopedics;  Laterality: Left;   IR KYPHO THORACIC WITH BONE BIOPSY  12/18/2018   IR RADIOLOGIST EVAL & MGMT  12/09/2018   IR RADIOLOGIST EVAL & MGMT  04/30/2019   KNEE CLOSED REDUCTION Left 02/03/2014   Procedure: CLOSED MANIPULATION LEFT KNEE;  Surgeon: Dempsey LULLA Moan, Hall;  Location: [facility];  Service: Orthopedics;  Laterality: Left;   LAPAROSCOPIC CHOLECYSTECTOMY  1996   RADIOACTIVE PLAQUE INSERTION Left 09/17/2016   @ [facility] by Natalie Hall;   I-125 plaque application to left eye   RADIOACTIVE PLAQUE REMOVAL Left 09/21/2016   @[facility] by Natalie Hall;   left eye   RECTOCELE REPAIR N/A 11/18/2023   Procedure: LEVATOR PLICATION AND PERINEORRHAPHY;  Surgeon: Natalie Hall;  Location: [facility];  Service: Gynecology;  Laterality: N/A;   REVERSE SHOULDER ARTHROPLASTY Left 06/28/2017   Procedure: LEFT SHOULDER REVERSE SHOULDER ARTHROPLASTY;  Surgeon: Kay Kemps, Hall;  Location: [facility];  Service: Orthopedics;  Laterality: Left;   REVERSE SHOULDER ARTHROPLASTY Right 11/03/2021   Procedure: REVERSE SHOULDER ARTHROPLASTY;  Surgeon: Kay Kemps, Hall;  Location: [facility];  Service: Orthopedics;  Laterality: Right;  with ISB   TOTAL KNEE ARTHROPLASTY Left 12/21/2013   Procedure: LEFT TOTAL KNEE ARTHROPLASTY;  Surgeon: Dempsey LULLA Moan, Hall;  Location: [facility];  Service: Orthopedics;  Laterality: Left;   VARICOSE VEIN SURGERY Bilateral    laser ablation     Family History: Family History  Problem Relation Age of Onset   Colon polyps Mother    Diabetes Mother    High blood pressure Mother    High Cholesterol Mother    Thyroid  disease Mother    Anxiety disorder Mother    Diabetes Father    High blood pressure Father    High Cholesterol Father    Heart disease Father    Depression Father    Obesity Father    Lung cancer Brother        spleen, bone   Colon cancer Maternal Aunt    Liver cancer Maternal Aunt    Breast  cancer Other    Coronary artery disease Other    Colon cancer Other    Diabetes Other    Hypertension Other    Kidney disease Other    Lung cancer Other    Esophageal cancer Neg Hx    Stomach cancer Neg Hx    Rectal cancer Neg Hx    Bladder Cancer Neg Hx    Uterine cancer Neg Hx     Social History: Social History   Socioeconomic History   Marital status: Married    Spouse name:  Earnest   Number of children: Not on file   Years of education: Not on file   Highest education level: Associate degree: occupational, Scientist, product/process development, or vocational program  Occupational History   Occupation: retired  Tobacco Use   Smoking status: Never   Smokeless tobacco: Never  Vaping Use   Vaping status: Never Used  Substance and Sexual Activity   Alcohol use: No    Alcohol/week: 0.0 standard drinks of alcohol   Drug use: Never   Sexual activity: Not Currently  Other Topics Concern   Not on file  Social History Narrative   Not on file   Social Drivers of Health   Financial Resource Strain: Low Risk  (01/14/2024)   Overall Financial Resource Strain (CARDIA)    Difficulty of Paying Living Expenses: Not hard at all  Food Insecurity: No Food Insecurity (01/14/2024)   Hunger Vital Sign    Worried About Running Out of Food in the Last Year: Never true    Ran Out of Food in the Last Year: Never true  Transportation Needs: No Transportation Needs (01/14/2024)   PRAPARE - Administrator, Civil Service (Medical): No    Lack of Transportation (Non-Medical): No  Physical Activity: Insufficiently Active (01/14/2024)   Exercise Vital Sign    Days of Exercise per Week: 1 day    Minutes of Exercise per Session: 30 min  Stress: Stress Concern Present (01/14/2024)   [employer] of Occupational Health - Occupational Stress Questionnaire    Feeling of Stress : To some extent  Social Connections: Socially Integrated (01/14/2024)   Social Connection and Isolation Panel    Frequency of  Communication with Friends and Family: More than three times a week    Frequency of Social Gatherings with Friends and Family: More than three times a week    Attends Religious Services: More than 4 times per year    Active Member of [employer] or Organizations: Yes    Attends Engineer, structural: More than 4 times per year    Marital Status: Married    Allergies: Allergies  Allergen Reactions   Codeine Shortness Of Breath, Rash and Other (See Comments)    Flushing, tolerates hydrocodone     Amoxicillin -Pot Clavulanate Rash     Has patient had a PCN reaction causing immediate rash, facial/tongue/throat swelling, SOB or lightheadedness with hypotension: No Has patient had a PCN reaction causing severe rash involving mucus membranes or skin necrosis: No Has patient had a PCN reaction that required hospitalization: No Has patient had a PCN reaction occurring within the last 10 years: Yes If all of the above answers are NO, then may proceed with Cephalosporin use.   Lexapro  [Escitalopram ] Rash    Outpatient Meds: Current Outpatient Medications  Medication Sig Dispense Refill   acetaminophen  (TYLENOL ) 500 MG tablet Take 1 tablet (500 mg total) by mouth every 6 (six) hours as needed (pain). 30 tablet 0   albuterol  (VENTOLIN  HFA) 108 (90 Base) MCG/ACT inhaler Inhale 2 puffs into the lungs every 4 (four) hours as needed for wheezing or shortness of breath. 18 g 5   alendronate  (FOSAMAX ) 35 MG tablet TAKE 1 TABLET BY MOUTH WEEKLY  WITH 8 OZ OF PLAIN WATER  30  MINUTES BEFORE FIRST FOOD, DRINK OR MEDS. STAY UPRIGHT FOR 30  MINS 12 tablet 3   aspirin  EC 81 MG tablet Take 81 mg by mouth daily. Swallow whole.     Calcium  Carb-Cholecalciferol  600-800 MG-UNIT TABS Take 2 tablets by  mouth daily.     cetirizine (ZYRTEC) 10 MG tablet Take 10 mg by mouth daily as needed. PRN     Cholecalciferol  (VITAMIN D ) 50 MCG (2000 UT) CAPS Take 1 capsule (2,000 Units total) by mouth daily. 30 capsule 0    estradiol  (ESTRACE ) 0.1 MG/GM vaginal cream Place 0.5g nightly two times a week 42.5 g 11   ezetimibe  (ZETIA ) 10 MG tablet TAKE 1 TABLET BY MOUTH DAILY 90 tablet 2   LORazepam  (ATIVAN ) 0.5 MG tablet Take 1 tablet (0.5 mg total) by mouth every 8 (eight) hours as needed for anxiety. 90 tablet 2   Magnesium 250 MG TABS Take 1 tablet by mouth daily.     metFORMIN  (GLUCOPHAGE ) 500 MG tablet Take 1 tablet (500 mg total) by mouth 2 (two) times daily with a meal. 180 tablet 0   metoprolol  succinate (TOPROL -XL) 25 MG 24 hr tablet TAKE 1 TABLET BY MOUTH TWICE  DAILY 180 tablet 2   Multiple Vitamins-Minerals (MULTIVITAL PO) Take 1 tablet by mouth daily.     MYRBETRIQ  50 MG TB24 tablet Take 1 tablet (50 mg total) by mouth daily. (Patient taking differently: Take 50 mg by mouth daily.) 90 tablet 3   omeprazole  (PRILOSEC) 40 MG capsule TAKE 1 CAPSULE BY MOUTH DAILY 90 capsule 3   Polyethyl Glycol-Propyl Glycol (SYSTANE ULTRA) 0.4-0.3 % SOLN Apply to eye as needed. PRN     ramipril  (ALTACE ) 10 MG capsule TAKE 1 CAPSULE BY MOUTH DAILY 90 capsule 2   rosuvastatin  (CRESTOR ) 40 MG tablet TAKE 1 TABLET BY MOUTH DAILY 90 tablet 2   temazepam  (RESTORIL ) 15 MG capsule TAKE 1 CAPSULE BY MOUTH EVERY  NIGHT AT BEDTIME AS NEEDED FOR  SLEEP 90 capsule 1   No current facility-administered medications for this visit.      ___________________________________________________________________ Objective   Exam:  BP 132/70   Pulse 69   Ht 5' 4 (1.626 m)   Wt 201 lb (91.2 kg)   LMP  (LMP Unknown)   BMI 34.50 kg/m  Wt Readings from Last 3 Encounters:  05/13/24 201 lb (91.2 kg)  05/06/24 196 lb (88.9 kg)  05/04/24 200 lb (90.7 kg)    General: Well-appearing, normal vocal quality Eyes: sclera anicteric, no redness ENT: oral mucosa moist without lesions, no cervical or supraclavicular lymphadenopathy CV: Regular without appreciable murmur, no JVD, no peripheral edema Resp: clear to auscultation bilaterally, normal  RR and effort noted GI: soft, no tenderness, with active bowel sounds. No guarding or palpable organomegaly noted. Skin; warm and dry, no rash or jaundice noted Neuro: awake, alert and oriented x 3. Normal gross motor function and fluent speech    Encounter Diagnoses  Name Primary?   Gastroesophageal reflux disease, unspecified whether esophagitis present Yes   Abdominal bloating     Assessment and Plan Assessment & Plan Gastroesophageal reflux disease (GERD) Chronic GERD with regurgitation, heartburn, and bloating. Symptoms not fully controlled with current medication. Possible hiatal hernia and lower esophageal sphincter laxity contributing.  She recalls being told after her cholecystectomy decades ago that she had a small hiatal hernia.  Perhaps that has enlarged over time. - Provided reading material on dietary and lifestyle modifications. We discussed the limitation of acid suppression therapy, breadth of diet and lifestyle changes required for reflux control, the possibility of reflux related complications such as esophagitis, stricture or Barrett's esophagus.  Other anatomic considerations such as gastric outlet obstruction or hiatal hernia may contribute to reflux.  - Recommended endoscopy to  evaluate for esophagitis, hiatal hernia, or other anatomical issues. She was agreeable to an EGD after discussion of procedure and risks  The benefits and risks of the planned procedure(s) were described in detail with the patient or (when appropriate) their health care proxy.  Risks were outlined as including, but not limited to, bleeding, infection, perforation, adverse medication reaction leading to cardiac or pulmonary decompensation, pancreatitis (if ERCP).  The limitation of incomplete mucosal visualization was also discussed.  No guarantees or warranties were given. Patient at increased risk for cardiopulmonary complications of procedure due to medical comorbidities.   Coronary artery  disease with prior stent placement in 2017 Up-to-date with cardiology follow-up and condition stable    Thank you for the courtesy of this consult.  Please call me with any questions or concerns.  Victory LITTIE Brand III  CC: Referring provider noted above

## 2024-05-21 ENCOUNTER — Encounter: Payer: Self-pay | Admitting: Gastroenterology

## 2024-05-21 ENCOUNTER — Ambulatory Visit (AMBULATORY_SURGERY_CENTER): Admitting: Gastroenterology

## 2024-05-21 VITALS — BP 106/50 | HR 60 | Temp 97.5°F | Resp 16 | Ht 64.0 in | Wt 204.0 lb

## 2024-05-21 DIAGNOSIS — K449 Diaphragmatic hernia without obstruction or gangrene: Secondary | ICD-10-CM

## 2024-05-21 DIAGNOSIS — K219 Gastro-esophageal reflux disease without esophagitis: Secondary | ICD-10-CM

## 2024-05-21 MED ORDER — SODIUM CHLORIDE 0.9 % IV SOLN: 500.0000 mL | Freq: Once | INTRAVENOUS | Status: DC

## 2024-05-21 NOTE — Progress Notes (Signed)
 No significant changes to clinical history since GI office visit on 05/13/24.  The patient is appropriate for an endoscopic procedure in the ambulatory setting.  - Victory Brand, MD

## 2024-05-21 NOTE — Patient Instructions (Signed)
 Resume previous diet. Continue present medications.  Follow an antireflux regiment. Efforts at weight loss and other GERD-related diet and lifestyle measures would be the next steps before considering hiatal hernia repair and fundoplication. Handout provided on hiatal hernia and GERD   YOU HAD AN ENDOSCOPIC PROCEDURE TODAY AT THE [facility]:   Refer to the procedure report that was given to you for any specific questions about what was found during the examination.  If the procedure report does not answer your questions, please call your gastroenterologist to clarify.  If you requested that your care partner not be given the details of your procedure findings, then the procedure report has been included in a sealed envelope for you to review at your convenience later.  YOU SHOULD EXPECT: Some feelings of bloating in the abdomen. Passage of more gas than usual.  Walking can help get rid of the air that was put into your GI tract during the procedure and reduce the bloating. If you had a lower endoscopy (such as a colonoscopy or flexible sigmoidoscopy) you may notice spotting of blood in your stool or on the toilet paper. If you underwent a bowel prep for your procedure, you may not have a normal bowel movement for a few days.  Please Note:  You might notice some irritation and congestion in your nose or some drainage.  This is from the oxygen used during your procedure.  There is no need for concern and it should clear up in a day or so.  SYMPTOMS TO REPORT IMMEDIATELY:  Following upper endoscopy (EGD)  Vomiting of blood or coffee ground material  New chest pain or pain under the shoulder blades  Painful or persistently difficult swallowing  New shortness of breath  Fever of 100F or higher  Black, tarry-looking stools  For urgent or emergent issues, a gastroenterologist can be reached at any hour by calling [phone]. Do not use MyChart messaging for urgent concerns.     DIET:  We do recommend a small meal at first, but then you may proceed to your regular diet.  Drink plenty of fluids but you should avoid alcoholic beverages for 24 hours.  ACTIVITY:  You should plan to take it easy for the rest of today and you should NOT DRIVE or use heavy machinery until tomorrow (because of the sedation medicines used during the test).    FOLLOW UP: Our staff will call the number listed on your records the next business day following your procedure.  We will call around 7:15- 8:00 am to check on you and address any questions or concerns that you may have regarding the information given to you following your procedure. If we do not reach you, we will leave a message.     If any biopsies were taken you will be contacted by phone or by letter within the next 1-3 weeks.  Please call us  at [phone] if you have not heard about the biopsies in 3 weeks.    SIGNATURES/CONFIDENTIALITY: You and/or your care partner have signed paperwork which will be entered into your electronic medical record.  These signatures attest to the fact that that the information above on your After Visit Summary has been reviewed and is understood.  Full responsibility of the confidentiality of this discharge information lies with you and/or your care-partner.

## 2024-05-21 NOTE — Op Note (Signed)
 [facility] Patient Name: Natalie Hall Procedure Date: 05/21/2024 9:38 AM MRN: [id number] Endoscopist: Victory L. Legrand , MD, [id number] Age: 73 Referring MD:  Date of Birth: 06-10-1951 Gender: Female Account #: [id number] Procedure:                Upper GI endoscopy Indications:              Esophageal reflux symptoms that persist despite                            appropriate therapy                           Persistent regurgitation worsening over the last                            year, heartburn under control on PPI Medicines:                Monitored Anesthesia Care Procedure:                Pre-Anesthesia Assessment:                           - Prior to the procedure, a History and Physical                            was performed, and patient medications and                            allergies were reviewed. The patient's tolerance of                            previous anesthesia was also reviewed. The risks                            and benefits of the procedure and the sedation                            options and risks were discussed with the patient.                            All questions were answered, and informed consent                            was obtained. Prior Anticoagulants: The patient has                            taken no anticoagulant or antiplatelet agents. ASA                            Grade Assessment: II - A patient with mild systemic                            disease. After reviewing the risks and benefits,  the patient was deemed in satisfactory condition to                            undergo the procedure.                           After obtaining informed consent, the endoscope was                            passed under direct vision. Throughout the                            procedure, the patient's blood pressure, pulse, and                            oxygen saturations were monitored continuously. The                             Endoscope was introduced through the mouth, and                            advanced to the second part of duodenum. The upper                            GI endoscopy was accomplished without difficulty.                            The patient tolerated the procedure well. Scope In: Scope Out: Findings:                 A 3-4 cm hiatal hernia was present.                           The exam of the esophagus was otherwise normal.                           The stomach was normal.                           The cardia and gastric fundus were normal on                            retroflexion.                           The examined duodenum was normal. Complications:            No immediate complications. Estimated Blood Loss:     Estimated blood loss: none. Impression:               - 3-4 cm hiatal hernia.                           - Normal stomach.                           - Normal examined duodenum.                           -  No specimens collected. Recommendation:           - Patient has a contact number available for                            emergencies. The signs and symptoms of potential                            delayed complications were discussed with the                            patient. Return to normal activities tomorrow.                            Written discharge instructions were provided to the                            patient.                           - Resume previous diet.                           - Continue present medications.                           - Follow an antireflux regimen. Efforts at weight                            loss and other GERD-related diet and lifestyle                            measures would be the next steps before considering                            hiatal hernia repair and fundoplication. Liv Rallis L. Legrand, MD 05/21/2024 10:21:47 AM This report has been signed electronically.

## 2024-05-21 NOTE — Progress Notes (Signed)
 Report to PACU, RN, vss, BBS= Clear.

## 2024-05-22 ENCOUNTER — Telehealth: Payer: Self-pay | Admitting: *Deleted

## 2024-05-22 NOTE — Telephone Encounter (Signed)
  Follow up Call-     05/21/2024    9:09 AM  Call back number  Post procedure Call Back phone  # [phone]  Permission to leave phone message Yes     Patient questions:  Do you have a fever, pain , or abdominal swelling? No. Pain Score  0 *  Have you tolerated food without any problems? Yes.    Have you been able to return to your normal activities? Yes.    Do you have any questions about your discharge instructions: Diet   No. Medications  No. Follow up visit  No.  Do you have questions or concerns about your Care? No.  Actions: * If pain score is 4 or above: No action needed, pain <4.

## 2024-06-02 ENCOUNTER — Ambulatory Visit: Admitting: Bariatrics

## 2024-06-02 ENCOUNTER — Encounter: Payer: Self-pay | Admitting: Bariatrics

## 2024-06-02 VITALS — BP 133/78 | HR 65 | Temp 97.8°F | Ht 64.0 in | Wt 196.0 lb

## 2024-06-02 DIAGNOSIS — Z6833 Body mass index (BMI) 33.0-33.9, adult: Secondary | ICD-10-CM

## 2024-06-02 DIAGNOSIS — E66811 Obesity, class 1: Secondary | ICD-10-CM

## 2024-06-02 DIAGNOSIS — E669 Obesity, unspecified: Secondary | ICD-10-CM

## 2024-06-02 DIAGNOSIS — R7303 Prediabetes: Secondary | ICD-10-CM | POA: Diagnosis not present

## 2024-06-02 DIAGNOSIS — E6609 Other obesity due to excess calories: Secondary | ICD-10-CM

## 2024-06-02 NOTE — Progress Notes (Signed)
 WEIGHT SUMMARY AND BIOMETRICS  Weight Lost Since Last Visit: 0  Weight Gained Since Last Visit: 0   Vitals Temp: 97.8 F (36.6 C) BP: 133/78 Pulse Rate: 65 SpO2: 98 %   Anthropometric Measurements Height: 5' 4 (1.626 m) Weight: 196 lb (88.9 kg) BMI (Calculated): 33.63 Weight at Last Visit: 196lb Weight Lost Since Last Visit: 0 Weight Gained Since Last Visit: 0 Starting Weight: 213lb Total Weight Loss (lbs): 17 lb (7.711 kg)   Body Composition  Body Fat %: 49.9 % Fat Mass (lbs): 98.2 lbs Muscle Mass (lbs): 93.6 lbs Total Body Water  (lbs): 69.6 lbs Visceral Fat Rating : 15   Other Clinical Data Fasting: no Labs: no Today's Visit #: 72 Starting Date: 08/04/18    OBESITY Charyl is here to discuss her progress with her obesity treatment plan along with follow-up of her obesity related diagnoses.    Nutrition Plan: the Category 2 plan - 70% adherence.  Current exercise: Silver sneakers She is exercising about 3 days a week.   Interim History:  Her weight remains the same.  She states that she was seen recently by her GI provider and told her that she still has her hiatal hernia.  She is trying to avoid foods that would cause any heartburn or regurgitation. Eating all of the food on the plan., Protein intake is as prescribed, Is not skipping meals, Water  intake is adequate., and Denies excessive cravings.  Hunger is moderately controlled.  Cravings are moderately controlled.  Assessment/Plan:   Prediabetes Last A1c was 6.3  Medication(s): Metformin  500 mg twice daily with meals Lab Results  Component Value Date   HGBA1C 6.3 05/04/2024   HGBA1C 6.0 04/29/2023   HGBA1C 6.0 04/27/2022   HGBA1C 6.1 (H) 10/20/2021   HGBA1C 5.9 04/13/2021   Lab Results  Component Value Date   INSULIN  19.4 04/29/2023   INSULIN  18.6 07/23/2022   INSULIN  16.6  12/06/2020   INSULIN  18.5 11/17/2019   INSULIN  15.4 11/20/2018    Plan: Will minimize all refined carbohydrates both sweets and starches.  Will work on the plan and exercise.  Consider both aerobic and resistance training.  Will keep protein, water , and fiber intake high.  Increase Polyunsaturated and Monounsaturated fats to increase satiety and encourage weight loss.  Aim for 7 to 9 hours of sleep nightly.  Continue Metformin  500 mg twice daily with meals  Will continue to cook at home.  We discussed foods and beverages that could possibly cause problems with her hiatal hernia.  I used the APP perplexity to review and stress the foods that she should avoid. Travel handout was given and discussed.    Generalized Obesity: Current BMI BMI (Calculated): 33.63   Pharmacotherapy Plan Continue  Metformin  500 mg twice daily with meals  Eliyana is currently in the action stage of change. As such, her goal is to  continue with weight loss efforts.  She has agreed to the Category 2 plan.  Exercise goals: All adults should avoid inactivity. Some physical activity is better than none, and adults who participate in any amount of physical activity gain some health benefits.  Behavioral modification strategies: increasing lean protein intake, no meal skipping, meal planning , planning for success, increasing vegetables, and mindful eating.  Kytzia has agreed to follow-up with our clinic in 4 weeks.    Objective:   VITALS: Per patient if applicable, see vitals. GENERAL: Alert and in no acute distress. CARDIOPULMONARY: No increased WOB. Speaking in clear sentences.  PSYCH: Pleasant and cooperative. Speech normal rate and rhythm. Affect is appropriate. Insight and judgement are appropriate. Attention is focused, linear, and appropriate.  NEURO: Oriented as arrived to appointment on time with no prompting.   Attestation Statements:   This was prepared with the assistance of Restaurant manager, fast food.  Occasional wrong-word or sound-a-like substitutions may have occurred due to the inherent limitations of voice recognition   Clayborne Daring, DO

## 2024-06-12 ENCOUNTER — Other Ambulatory Visit: Payer: Self-pay | Admitting: Obstetrics and Gynecology

## 2024-06-12 DIAGNOSIS — N3281 Overactive bladder: Secondary | ICD-10-CM

## 2024-06-25 ENCOUNTER — Other Ambulatory Visit: Payer: Self-pay | Admitting: Family Medicine

## 2024-07-14 ENCOUNTER — Ambulatory Visit: Admitting: Bariatrics

## 2024-07-14 LAB — HM DIABETES EYE EXAM

## 2024-07-27 ENCOUNTER — Ambulatory Visit: Admitting: Bariatrics

## 2024-07-27 ENCOUNTER — Encounter: Payer: Self-pay | Admitting: Bariatrics

## 2024-07-27 VITALS — BP 127/75 | HR 63 | Temp 98.1°F | Ht 64.0 in | Wt 200.0 lb

## 2024-07-27 DIAGNOSIS — Z6834 Body mass index (BMI) 34.0-34.9, adult: Secondary | ICD-10-CM

## 2024-07-27 DIAGNOSIS — I10 Essential (primary) hypertension: Secondary | ICD-10-CM

## 2024-07-27 DIAGNOSIS — E6609 Other obesity due to excess calories: Secondary | ICD-10-CM

## 2024-07-27 DIAGNOSIS — R7303 Prediabetes: Secondary | ICD-10-CM | POA: Diagnosis not present

## 2024-07-27 DIAGNOSIS — E669 Obesity, unspecified: Secondary | ICD-10-CM | POA: Diagnosis not present

## 2024-07-27 MED ORDER — METFORMIN HCL 500 MG PO TABS: 500.0000 mg | ORAL_TABLET | Freq: Two times a day (BID) | ORAL | 0 refills | Status: DC

## 2024-07-27 NOTE — Progress Notes (Signed)
 WEIGHT SUMMARY AND BIOMETRICS  Weight Lost Since Last Visit: 0lb  Weight Gained Since Last Visit: 4lb   Vitals Temp: 98.1 F (36.7 C) BP: 127/75 Pulse Rate: 63 SpO2: 99 %   Anthropometric Measurements Height: 5' 4 (1.626 m) Weight: 200 lb (90.7 kg) BMI (Calculated): 34.31 Weight at Last Visit: 196lb Weight Lost Since Last Visit: 0lb Weight Gained Since Last Visit: 4lb Starting Weight: 213lb Total Weight Loss (lbs): 13 lb (5.897 kg)   Body Composition  Body Fat %: 50.3 % Fat Mass (lbs): 100.6 lbs Muscle Mass (lbs): 94.4 lbs Total Body Water  (lbs): 71.2 lbs Visceral Fat Rating : 16   Other Clinical Data Fasting: no Labs: no Today's Visit #: 11 Starting Date: 08/04/18    OBESITY Yeraldin is here to discuss her progress with her obesity treatment plan along with follow-up of her obesity related diagnoses.    Nutrition Plan: the Category 2 plan - 60% adherence.  Current exercise: Silver sneakers.   Interim History:  She is up 4 lbs since her last visit. She has been on vacation eating out 3 meals a day.  Eating all of the food on the plan., Protein intake is as prescribed, Not journaling consistently., Meeting protein goals., and Water  intake is inadequate.   Pharmacotherapy: Cing is on Metformin  500 mg twice daily with meals Adverse side effects: None Hunger is moderately controlled.  Cravings are moderately controlled.  Assessment/Plan:   Prediabetes Last A1c was 6.3  Medication(s): Metformin  500 mg twice daily with meals Lab Results  Component Value Date   HGBA1C 6.3 05/04/2024   HGBA1C 6.0 04/29/2023   HGBA1C 6.0 04/27/2022   HGBA1C 6.1 (H) 10/20/2021   HGBA1C 5.9 04/13/2021   Lab Results  Component Value Date   INSULIN  19.4 04/29/2023   INSULIN  18.6 07/23/2022   INSULIN  16.6 12/06/2020   INSULIN  18.5 11/17/2019   INSULIN   15.4 11/20/2018    Plan: Will minimize all refined carbohydrates both sweets and starches.  Will work on the plan and exercise.  Consider both aerobic and resistance training.  Will keep protein, water , and fiber intake high.  Increase Polyunsaturated and Monounsaturated fats to increase satiety and encourage weight loss.  Aim for 7 to 9 hours of sleep nightly.  She will cook her vegetables instead of raw.  Will increase her water  to 64 ounces.  Will increase her Silver Sneakers to 3 days a week.  Continue and refill Metformin  500 mg twice daily with meals  Will use a brace for her knee (Tommy Cooper}.  Hypertension Hypertension well controlled.  Medication(s): Altace  10 mg, Toprol -XL 25 mg  BP Readings from Last 3 Encounters:  07/27/24 127/75  06/02/24 133/78  05/21/24 (!) 106/50   Lab Results  Component Value Date   CREATININE 0.67 05/04/2024   CREATININE 0.70 11/18/2023   CREATININE 0.67 04/29/2023   Lab  Results  Component Value Date   GFR 86.36 05/04/2024   GFR 86.98 04/29/2023   GFR 88.57 04/27/2022    Plan: Continue all antihypertensives at current dosages. No added salt. Will keep sodium content to 1,500 mg or less per day.   Increase his water .     Generalized Obesity: Current BMI BMI (Calculated): 34.31    Orva is currently in the action stage of change. As such, her goal is to continue with weight loss efforts.  She has agreed to the Category 2 plan.  Exercise goals: Older adults should determine their level of effort for physical activity relative to their level of fitness.   Behavioral modification strategies: increasing lean protein intake, meal planning , increase water  intake, better snacking choices, increasing vegetables, keep healthy foods in the home, increase frequency of journaling, and weigh protein portions.  Perpetua has agreed to follow-up with our clinic in 4 weeks.      Objective:   VITALS: Per patient if applicable, see  vitals. GENERAL: Alert and in no acute distress. CARDIOPULMONARY: No increased WOB. Speaking in clear sentences.  PSYCH: Pleasant and cooperative. Speech normal rate and rhythm. Affect is appropriate. Insight and judgement are appropriate. Attention is focused, linear, and appropriate.  NEURO: Oriented as arrived to appointment on time with no prompting.   Attestation Statements:   This was prepared with the assistance of Engineer, civil (consulting).  Occasional wrong-word or sound-a-like substitutions may have occurred due to the inherent limitations of voice recognition   Clayborne Daring, DO

## 2024-08-04 ENCOUNTER — Telehealth: Payer: Self-pay

## 2024-08-04 ENCOUNTER — Ambulatory Visit (INDEPENDENT_AMBULATORY_CARE_PROVIDER_SITE_OTHER): Admitting: Family Medicine

## 2024-08-04 ENCOUNTER — Encounter: Payer: Self-pay | Admitting: Family Medicine

## 2024-08-04 VITALS — BP 120/70 | HR 63 | Temp 98.2°F | Wt 200.0 lb

## 2024-08-04 DIAGNOSIS — R21 Rash and other nonspecific skin eruption: Secondary | ICD-10-CM

## 2024-08-04 DIAGNOSIS — J40 Bronchitis, not specified as acute or chronic: Secondary | ICD-10-CM | POA: Diagnosis not present

## 2024-08-04 MED ORDER — HYDROCODONE BIT-HOMATROP MBR 5-1.5 MG/5ML PO SOLN: 5.0000 mL | ORAL | 0 refills | Status: DC | PRN

## 2024-08-04 MED ORDER — DOXYCYCLINE HYCLATE 100 MG PO TABS: 100.0000 mg | ORAL_TABLET | Freq: Two times a day (BID) | ORAL | 0 refills | Status: DC

## 2024-08-04 NOTE — Progress Notes (Signed)
   Subjective:    Patient ID: Natalie Hall, female    DOB: [birth year], 73 y.o.   MRN: [id number]  HPI Here for 10 days of chest congestion, SOB, and coughing up green sputum. No fever. She went to an urgent care on 07-28-24, and they felt this was a viral infection. They put her on a Prednisone  taper. Then she saw her Orthopedist 2 days ago for a steroid injection in her knee. Then yesterday she developed an itchy rash all over her body.    Review of Systems  Constitutional: Negative.   HENT:  Negative for congestion, ear pain, postnasal drip, sinus pressure and sore throat.   Eyes: Negative.   Respiratory:  Positive for cough, shortness of breath and wheezing.   Cardiovascular: Negative.   Skin:  Positive for rash.       Objective:   Physical Exam Constitutional:      Appearance: Normal appearance. She is not ill-appearing.  HENT:     Right Ear: Tympanic membrane, ear canal and external ear normal.     Left Ear: Tympanic membrane, ear canal and external ear normal.     Nose: Nose normal.     Mouth/Throat:     Pharynx: Oropharynx is clear.  Eyes:     Conjunctiva/sclera: Conjunctivae normal.  Pulmonary:     Effort: Pulmonary effort is normal.     Breath sounds: Normal breath sounds.  Lymphadenopathy:     Cervical: No cervical adenopathy.  Skin:    Comments: Widespread erythematous maculopapular rash on arms, legs, and trunk   Neurological:     Mental Status: She is alert.           Assessment & Plan:  She has a bronchitis, and we will treat this with 10 days of Doxycycline . Use the inhaler as needed. She also has a rash which is likely the result of high levels of steroids in her body. This will resolve over the next week or two. In the meantime she can use Benadryl  as needed for the itching.  Garnette Olmsted, MD

## 2024-08-04 NOTE — Addendum Note (Signed)
 Addended by: JOHNNY SENIOR A on: 08/04/2024 04:48 PM   Modules accepted: Orders

## 2024-08-04 NOTE — Telephone Encounter (Signed)
 Done

## 2024-08-04 NOTE — Telephone Encounter (Signed)
 Copied from CRM [id number]. Topic: Clinical - Prescription Issue >> Aug 04, 2024 11:43 AM [place] J wrote: Reason for CRM: The patient's medication is not in stock at her preferred pharmacy. The patient would like if the medication (HYDROcodone  bit-homatropine (HYCODAN) 5-1.5 MG/5ML syrup) could be sent to:  [facility] [address], [place], [place] [address] [phone]

## 2024-08-12 ENCOUNTER — Encounter: Payer: Self-pay | Admitting: Family Medicine

## 2024-08-14 ENCOUNTER — Ambulatory Visit

## 2024-08-14 DIAGNOSIS — R21 Rash and other nonspecific skin eruption: Secondary | ICD-10-CM | POA: Diagnosis not present

## 2024-08-14 MED ORDER — METHYLPREDNISOLONE ACETATE 40 MG/ML IJ SUSP
40.0000 mg | Freq: Once | INTRAMUSCULAR | Status: AC
  Administered 2024-08-14: 40 mg via INTRAMUSCULAR

## 2024-08-14 MED ORDER — METHYLPREDNISOLONE ACETATE 80 MG/ML IJ SUSP
80.0000 mg | Freq: Once | INTRAMUSCULAR | Status: AC
  Administered 2024-08-14: 80 mg via INTRAMUSCULAR

## 2024-08-14 NOTE — Telephone Encounter (Signed)
 She can come by the office for a cortisone shot today if she wishes (no OV is needed since I have already seen it)

## 2024-08-14 NOTE — Progress Notes (Signed)
 After obtaining consent, and per orders of Dr. Johnny, injection of Methylprednisolone  120 mg/mL given by Inocente LOISE Citizen. Patient instructed to remain in clinic for 20 minutes afterwards, and to report any adverse reaction to me immediately.

## 2024-08-14 NOTE — Telephone Encounter (Signed)
 Pt advised to schedule a nurse visit for a cortisone injection per Dr Johnny

## 2024-08-27 ENCOUNTER — Ambulatory Visit (INDEPENDENT_AMBULATORY_CARE_PROVIDER_SITE_OTHER): Admitting: Bariatrics

## 2024-08-27 ENCOUNTER — Encounter: Payer: Self-pay | Admitting: Bariatrics

## 2024-08-27 VITALS — BP 148/77 | HR 61 | Temp 97.8°F | Ht 64.0 in | Wt 193.0 lb

## 2024-08-27 DIAGNOSIS — E6609 Other obesity due to excess calories: Secondary | ICD-10-CM

## 2024-08-27 DIAGNOSIS — Z6833 Body mass index (BMI) 33.0-33.9, adult: Secondary | ICD-10-CM | POA: Diagnosis not present

## 2024-08-27 DIAGNOSIS — E785 Hyperlipidemia, unspecified: Secondary | ICD-10-CM

## 2024-08-27 DIAGNOSIS — E669 Obesity, unspecified: Secondary | ICD-10-CM

## 2024-08-27 NOTE — Progress Notes (Signed)
 WEIGHT SUMMARY AND BIOMETRICS  Weight Lost Since Last Visit: 7lb  Weight Gained Since Last Visit: 0   Vitals Temp: 97.8 F (36.6 C) BP: (!) 148/77 Pulse Rate: 61 SpO2: 98 %   Anthropometric Measurements Height: 5' 4 (1.626 m) Weight: 193 lb (87.5 kg) BMI (Calculated): 33.11 Weight at Last Visit: 200lb Weight Lost Since Last Visit: 7lb Weight Gained Since Last Visit: 0 Starting Weight: 213lb Total Weight Loss (lbs): 20 lb (9.072 kg)   Body Composition  Body Fat %: 49.7 % Fat Mass (lbs): 96.4 lbs Muscle Mass (lbs): 92.4 lbs Total Body Water  (lbs): 66.2 lbs Visceral Fat Rating : 15   Other Clinical Data Fasting: no Labs: no Today's Visit #: 15 Starting Date: 08/04/18    OBESITY Natalie Hall is here to discuss her progress with her obesity treatment plan along with follow-up of her obesity related diagnoses.    Nutrition Plan: the Category 2 plan - 80-90% adherence.  Current exercise: none  Interim History:  She is down 7 lbs since her last visit Eating all of the food on the plan., Protein intake is as prescribed, and Water  intake is adequate. She is drinking more water .   Pharmacotherapy: Natalie Hall is not on any anti-obesity medications.  Hunger is moderately controlled.  Cravings are moderately controlled.  Assessment/Plan:    Hyperlipidemia LDL is not at goal. Medication(s): Zetia  and Crestor   Cardiovascular risk factors: advanced age (older than 73 for men, 64 for women), dyslipidemia, obesity (BMI >= 30 kg/m2), and sedentary lifestyle  Lab Results  Component Value Date   CHOL 96 05/04/2024   HDL 33.20 (L) 05/04/2024   LDLCALC 38 05/04/2024   LDLDIRECT 96.0 03/11/2018   TRIG 125.0 05/04/2024   CHOLHDL 3 05/04/2024   Lab Results  Component Value Date   ALT 26 05/04/2024   AST 26 05/04/2024   ALKPHOS 31 (L) 05/04/2024   BILITOT 0.5  05/04/2024   The ASCVD Risk score (Arnett DK, et al., 2019) failed to calculate for the following reasons:   Risk score cannot be calculated because patient has a medical history suggesting prior/existing ASCVD  Plan:  Continue Crestor  and Zetia .  Information sheet on healthy vs unhealthy fats.  Will avoid all trans fats.  Will read labels Will minimize saturated fats except the following: low fat meats in moderation, diary, and limited dark chocolate.  Handouts for the  Anti-inflammatory food pyramid, and Healthy fats vs Unhealthy fats.  Recipes 2.     Generalized Obesity: Current BMI BMI (Calculated): 33.11   Pharmacotherapy Plan No anti-obesity medications  Natalie Hall is currently in the action stage of change. As such, her goal is to continue with weight loss efforts.  She has agreed to the Category 2 plan.  Exercise goals: Older adults should determine their level of effort for physical activity relative to their level of fitness.  Behavioral modification strategies: increasing lean protein intake, no meal skipping, meal planning , better snacking choices, planning for success, increasing vegetables, increasing fiber rich foods, keep healthy foods in the home, increase frequency of journaling, weigh protein portions, and work on smaller portions.  Natalie Hall has agreed to follow-up with our clinic in 4 weeks.    Objective:   VITALS: Per patient if applicable, see vitals. GENERAL: Alert and in no acute distress. CARDIOPULMONARY: No increased WOB. Speaking in clear sentences.  PSYCH: Pleasant and cooperative. Speech normal rate and rhythm. Affect is appropriate. Insight and judgement are appropriate. Attention is focused, linear, and appropriate.  NEURO: Oriented as arrived to appointment on time with no prompting.   Attestation Statements:   This was prepared with the assistance of Engineer, Civil (consulting).  Occasional wrong-word or sound-a-like substitutions may have occurred  due to the inherent limitations of voice recognition   Clayborne Daring, DO

## 2024-08-31 ENCOUNTER — Telehealth (HOSPITAL_BASED_OUTPATIENT_CLINIC_OR_DEPARTMENT_OTHER): Payer: Self-pay

## 2024-08-31 NOTE — Telephone Encounter (Signed)
 Dr. Mona, Natalie Hall has a right total knee arthroplasty coming up on 11/23/2024. Considering her history of PCI in 02/2016 with a STENT SYNERGY DES 3X32, could you please advise on her aspirin  hold for her upcoming procedure?

## 2024-08-31 NOTE — Telephone Encounter (Signed)
   Pre-operative Risk Assessment    Patient Name: Natalie Hall  DOB: 1951-09-18 MRN: [id number]  Date of last office visit: 01/10/24 with Dr Mona Date of next office visit: NA    Request for Surgical Clearance    Procedure:  Right total knee arthroplasty  Date of Surgery:  Clearance 11/23/24                                  Surgeon: Dr Dempsey Moan Surgeon's Group or Practice Name:  [facility] Phone number:  [phone] Fax number:  [phone]   Type of Clearance Requested:   - Medical  - Pharmacy:  Hold Aspirin  Not indicated   Type of Anesthesia:  Choice   Additional requests/questions:    SignedAugustin JONETTA Daring   08/31/2024, 9:04 AM

## 2024-08-31 NOTE — Telephone Encounter (Signed)
 Ok to hold aspirin  up to 7 days prior to surgery if needed and restart after.  Dr. Mona

## 2024-09-01 ENCOUNTER — Telehealth (HOSPITAL_BASED_OUTPATIENT_CLINIC_OR_DEPARTMENT_OTHER): Payer: Self-pay | Admitting: *Deleted

## 2024-09-01 NOTE — Telephone Encounter (Signed)
   Name: Natalie Hall  DOB: November 21, 1950  MRN: [id number]  Primary Cardiologist: Vinie JAYSON Maxcy, MD   Preoperative team, please contact this patient and set up a phone call appointment for further preoperative risk assessment. Please obtain consent and complete medication review. Thank you for your help.  I confirm that guidance regarding antiplatelet and oral anticoagulation therapy has been completed and, if necessary, noted below.  Per Dr. Maxcy, okay to hold aspirin  for 7 days prior to surgery.  I also confirmed the patient resides in the state of [place] . As per [facility] Medical Board telemedicine laws, the patient must reside in the state in which the provider is licensed.    Barnie Hila, NP 09/01/2024, 8:00 AM [facility]

## 2024-09-01 NOTE — Telephone Encounter (Signed)
 S/w the pt and she has been scheduled tele preop appt 11/04/24. Med rec and consent are done.       Patient Consent for Virtual Visit        Natalie Hall has provided verbal consent on 09/01/2024 for a virtual visit (video or telephone).   CONSENT FOR VIRTUAL VISIT FOR:  Natalie Hall  By participating in this virtual visit I agree to the following:  I hereby voluntarily request, consent and authorize [facility] and its employed or contracted physicians, physician assistants, nurse practitioners or other licensed health care professionals (the Practitioner), to provide me with telemedicine health care services (the "Services) as deemed necessary by the treating Practitioner. I acknowledge and consent to receive the Services by the Practitioner via telemedicine. I understand that the telemedicine visit will involve communicating with the Practitioner through live audiovisual communication technology and the disclosure of certain medical information by electronic transmission. I acknowledge that I have been given the opportunity to request an in-person assessment or other available alternative prior to the telemedicine visit and am voluntarily participating in the telemedicine visit.  I understand that I have the right to withhold or withdraw my consent to the use of telemedicine in the course of my care at any time, without affecting my right to future care or treatment, and that the Practitioner or I may terminate the telemedicine visit at any time. I understand that I have the right to inspect all information obtained and/or recorded in the course of the telemedicine visit and may receive copies of available information for a reasonable fee.  I understand that some of the potential risks of receiving the Services via telemedicine include:  Delay or interruption in medical evaluation due to technological equipment failure or disruption; Information transmitted may not be sufficient  (e.g. poor resolution of images) to allow for appropriate medical decision making by the Practitioner; and/or  In rare instances, security protocols could fail, causing a breach of personal health information.  Furthermore, I acknowledge that it is my responsibility to provide information about my medical history, conditions and care that is complete and accurate to the best of my ability. I acknowledge that Practitioner's advice, recommendations, and/or decision may be based on factors not within their control, such as incomplete or inaccurate data provided by me or distortions of diagnostic images or specimens that may result from electronic transmissions. I understand that the practice of medicine is not an exact science and that Practitioner makes no warranties or guarantees regarding treatment outcomes. I acknowledge that a copy of this consent can be made available to me via my patient portal [facility] MyChart), or I can request a printed copy by calling the office of [facility].    I understand that my insurance will be billed for this visit.   I have read or had this consent read to me. I understand the contents of this consent, which adequately explains the benefits and risks of the Services being provided via telemedicine.  I have been provided ample opportunity to ask questions regarding this consent and the Services and have had my questions answered to my satisfaction. I give my informed consent for the services to be provided through the use of telemedicine in my medical care

## 2024-09-01 NOTE — Telephone Encounter (Signed)
 S/w the pt and she has been scheduled tele preop appt 11/04/24. Med rec and consent are done.

## 2024-09-28 ENCOUNTER — Ambulatory Visit: Admitting: Bariatrics

## 2024-09-28 ENCOUNTER — Encounter: Payer: Self-pay | Admitting: Bariatrics

## 2024-09-28 VITALS — BP 144/80 | HR 69 | Ht 64.0 in | Wt 191.0 lb

## 2024-09-28 DIAGNOSIS — I10 Essential (primary) hypertension: Secondary | ICD-10-CM | POA: Diagnosis not present

## 2024-09-28 DIAGNOSIS — R7303 Prediabetes: Secondary | ICD-10-CM

## 2024-09-28 DIAGNOSIS — Z6832 Body mass index (BMI) 32.0-32.9, adult: Secondary | ICD-10-CM | POA: Diagnosis not present

## 2024-09-28 DIAGNOSIS — E669 Obesity, unspecified: Secondary | ICD-10-CM | POA: Diagnosis not present

## 2024-09-28 DIAGNOSIS — E6609 Other obesity due to excess calories: Secondary | ICD-10-CM

## 2024-09-28 MED ORDER — METFORMIN HCL 500 MG PO TABS: 500.0000 mg | ORAL_TABLET | Freq: Two times a day (BID) | ORAL | 0 refills | Status: AC

## 2024-09-28 NOTE — Progress Notes (Signed)
 WEIGHT SUMMARY AND BIOMETRICS  Weight Lost Since Last Visit: 2lb  Weight Gained Since Last Visit: 0   Vitals BP: (!) 144/80 Pulse Rate: 69 SpO2: 98 %   Anthropometric Measurements Height: 5' 4 (1.626 m) Weight: 191 lb (86.6 kg) BMI (Calculated): 32.77 Weight at Last Visit: 193lb Weight Lost Since Last Visit: 2lb Weight Gained Since Last Visit: 0 Starting Weight: 213lb Total Weight Loss (lbs): 22 lb (9.979 kg)   Body Composition  Body Fat %: 49.7 % Fat Mass (lbs): 95.2 lbs Muscle Mass (lbs): 91.4 lbs Total Body Water  (lbs): 67.4 lbs Visceral Fat Rating : 15   Other Clinical Data Fasting: no Labs: no Today's Visit #: 66 Starting Date: 08/04/18    OBESITY Natalie Hall is here to discuss her progress with her obesity treatment plan along with follow-up of her obesity related diagnoses.    Nutrition Plan: the Category 2 plan - 80-90% adherence.  Current exercise: none  Interim History:  She is down another 2 lbs over the holiday. She wants to get below 190 lbs before her next.  Eating all of the food on the plan., Protein intake is as prescribed, Not journaling consistently., and Water  intake is adequate.   Pharmacotherapy: Natalie Hall is on Metformin  500 mg twice daily with meals Adverse side effects: None Hunger is well controlled.  Cravings are moderately controlled.  Assessment/Plan:   Prediabetes Last A1c was 6.3  Medication(s):  Metformin  500 mg twice daily with meals Lab Results  Component Value Date   HGBA1C 6.3 05/04/2024   HGBA1C 6.0 04/29/2023   HGBA1C 6.0 04/27/2022   HGBA1C 6.1 (H) 10/20/2021   HGBA1C 5.9 04/13/2021   Lab Results  Component Value Date   INSULIN  19.4 04/29/2023   INSULIN  18.6 07/23/2022   INSULIN  16.6 12/06/2020   INSULIN  18.5 11/17/2019   INSULIN  15.4 11/20/2018    Plan: Will minimize all refined  carbohydrates both sweets and starches.  Will work on the plan and exercise.  Consider both aerobic and resistance training.  Will keep protein, water , and fiber intake high.  Increase Polyunsaturated and Monounsaturated fats to increase satiety and encourage weight loss.  Aim for 7 to 9 hours of sleep nightly.  Continue and refill Metformin  500 mg twice daily with meals   Hypertension Hypertension borderline controlled.  Medication(s): metoprolol  XL 25 mg daily  , Altace  10 mg.   BP Readings from Last 3 Encounters:  09/28/24 (!) 144/80  08/27/24 (!) 148/77  08/04/24 120/70   Lab Results  Component Value Date   CREATININE 0.67 05/04/2024   CREATININE 0.70 11/18/2023   CREATININE 0.67 04/29/2023   Lab Results  Component Value Date   GFR 86.36 05/04/2024   GFR 86.98 04/29/2023   GFR 88.57 04/27/2022    Plan: Continue all antihypertensives at current dosages. No added salt. Will keep sodium content to 1,500 mg  or less per day.      Generalized Obesity: Current BMI BMI (Calculated): 32.77   Pharmacotherapy Plan Continue and refill  Metformin  500 mg twice daily with meals  Natalie Hall is currently in the action stage of change. As such, her goal is to continue with weight loss efforts.  She has agreed to the Category 2 plan.  Exercise goals: Older adults should determine their level of effort for physical activity relative to their level of fitness.   Behavioral modification strategies: increasing lean protein intake, no meal skipping, meal planning , increase water  intake, better snacking choices, planning for success, increasing vegetables, increasing fiber rich foods, avoiding temptations, keep healthy foods in the home, and mindful eating.  Natalie Hall has agreed to follow-up with our clinic in 4 weeks.    Objective:   VITALS: Per patient if applicable, see vitals. GENERAL: Alert and in no acute distress. CARDIOPULMONARY: No increased WOB. Speaking in clear sentences.   PSYCH: Pleasant and cooperative. Speech normal rate and rhythm. Affect is appropriate. Insight and judgement are appropriate. Attention is focused, linear, and appropriate.  NEURO: Oriented as arrived to appointment on time with no prompting.   Attestation Statements:   This was prepared with the assistance of Engineer, Civil (consulting).  Occasional wrong-word or sound-a-like substitutions may have occurred due to the inherent limitations of voice recognition.   Clayborne Daring, DO

## 2024-11-02 ENCOUNTER — Encounter: Payer: Self-pay | Admitting: *Deleted

## 2024-11-02 ENCOUNTER — Encounter: Payer: Self-pay | Admitting: Family Medicine

## 2024-11-02 DIAGNOSIS — C44622 Squamous cell carcinoma of skin of right upper limb, including shoulder: Secondary | ICD-10-CM

## 2024-11-02 DIAGNOSIS — C6992 Malignant neoplasm of unspecified site of left eye: Secondary | ICD-10-CM

## 2024-11-02 DIAGNOSIS — N393 Stress incontinence (female) (male): Secondary | ICD-10-CM

## 2024-11-02 DIAGNOSIS — I471 Supraventricular tachycardia, unspecified: Secondary | ICD-10-CM

## 2024-11-02 DIAGNOSIS — I2510 Atherosclerotic heart disease of native coronary artery without angina pectoris: Secondary | ICD-10-CM

## 2024-11-02 DIAGNOSIS — M150 Primary generalized (osteo)arthritis: Secondary | ICD-10-CM

## 2024-11-03 NOTE — Progress Notes (Unsigned)
 "   Virtual Visit via Telephone Note   Because of Natalie Hall co-morbid illnesses, she is at least at moderate risk for complications without adequate follow up.  This format is felt to be most appropriate for this patient at this time.  Due to technical limitations with video connection (technology), today's appointment will be conducted as an audio only telehealth visit, and ADALI PENNINGS verbally agreed to proceed in this manner.   All issues noted in this document were discussed and addressed.  No physical exam could be performed with this format.  Evaluation Performed:  Preoperative cardiovascular risk assessment _____________   Date:  11/04/2024   Patient ID:  ANALIS Hall, DOB 1951/06/07, MRN [id number] Patient Location:  Home Provider location:   Office  Primary Care Provider:  Johnny Garnette LABOR, MD Primary Cardiologist:  Vinie JAYSON Maxcy, MD  Chief Complaint / Patient Profile   74 y.o. y/o female with a h/o CAD s/p NSTEMI with DES to RCA 03/14/2016, hypertension, hyperlipidemia, PAF s/p ablation 04/2016 who is pending right total knee arthroplastic 11/23/24 and presents today for telephonic preoperative cardiovascular risk assessment.  History of Present Illness    Natalie Hall is a 74 y.o. female who presents via audio/video conferencing for a telehealth visit today.  Pt was last seen in cardiology clinic on 01/10/2024 by Dr. Maxcy.  At that time Natalie Hall was doing well. She was taking care of her mother who reportedly had dementia. The patient is now pending procedure as outlined above. Since her last visit, she has done very well from a cardiac standpoint. Activity is now limited by knee and back pain, but she was participating in [employer] three times per week up until September. She is able to achieve greater than 4 METS of activity without any concerning cardiac symptoms. She denies chest pain, shortness of breath, orthopnea, PND, lower extremity edema, palpitations,  lightheadedness, dizziness, syncope.   Past Medical History    Past Medical History:  Diagnosis Date   Anxiety    Asthma    followe by pcp'  uncomplicated,  rescue inhaler prn   Chronic constipation    Chronic low back pain    Coronary artery disease 2017   cardiologist--- dr maxcy;   NSTEMI  w/ positive NUC 03-13-2016 in setting 8 hrs SVT;  cath 03-14-2016  w/ PCI and DES to mid to distal RCA,  normal LVEF   Fatty liver    followed by pcp  ;  in epic abd ultrasound 05-15-2023   GERD (gastroesophageal reflux disease)    Hiatal hernia    History of non-ST elevation myocardial infarction (NSTEMI) 03/11/2016   Hx of adenomatous colonic polyps    Hyperlipidemia, mixed    Hypertension    Malignant melanoma of eye, left (HCC) 2017   followed by dr c. 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 Jane MO SURGERY Left 07/05/2003   @[facility]  by dr jane;   peroneal tendon repair   BIOPSY  11/18/2023   Procedure: BLADDER BIOPSY;  Surgeon: Marilynne Rosaline SAILOR, MD;  Location: [facility];  Service: Gynecology;;   BLADDER SUSPENSION N/A 11/18/2023   Procedure: TRANSVAGINAL TAPE (TVT) PROCEDURE--Altis Sling;  Surgeon: Marilynne Rosaline SAILOR, MD;  Location: [facility];  Service: Gynecology;  Laterality: N/A;   CARDIAC CATHETERIZATION N/A 03/14/2016    Procedure: Left Heart Cath and Coronary Angiography;  Surgeon: Peter M Jordan, MD;  [facility] CV LAB;  Service: Cardiovascular;  Laterality: N/A;   CARDIAC CATHETERIZATION N/A 03/14/2016   Procedure: Coronary Stent Intervention;  Surgeon: Peter M Jordan, MD;  [facility]VAS[facility] CV LAB;  Service: Cardiovascular;  Laterality: N/A;   CARPAL TUNNEL RELEASE Left 04/01/2001   @ [facility] by dr sypher;   And left thumb pulley release   CATARACT EXTRACTION W/ INTRAOCULAR LENS IMPLANT Bilateral 2014   COLONOSCOPY  08/14/2021   per Dr. Teressa, precancerous polyps, repeat in 7 yrs   COLONOSCOPY WITH PROPOFOL   08/14/2021   by dr d. teressa   COLPOCLEISIS N/A 11/18/2023   Procedure: COLPOCLEISIS;  Surgeon: Marilynne Rosaline SAILOR, MD;  Location: [facility];  Service: Gynecology;  Laterality: N/A;   CYSTOSCOPY N/A 11/18/2023   Procedure: CYSTOSCOPY;  Surgeon: Marilynne Rosaline SAILOR, MD;  Location: [facility];  Service: Gynecology;  Laterality: N/A;   ELECTROPHYSIOLOGIC STUDY N/A 05/21/2016   Procedure: SVT Ablation;  Surgeon: Will Gladis Norton, MD;  Location: [facility] INVASIVE CV LAB;  Service: Cardiovascular;  Laterality: N/A;   EXCISION HAGLUND'S DEFORMITY WITH ACHILLES TENDON REPAIR Left 07/02/2019   Procedure: Left Achilles tendon debridement and reconstruction and excision of Haglund deformity;  Surgeon: Kit Rush, MD;  Location: [facility];  Service: Orthopedics;  Laterality: Left;   GASTROCNEMIUS RECESSION Left 07/02/2019   Procedure: Gastroc recession;  Surgeon: Kit Rush, MD;  Location: [facility];  Service: Orthopedics;  Laterality: Left;   IR KYPHO THORACIC WITH BONE BIOPSY  12/18/2018   IR RADIOLOGIST EVAL & MGMT  12/09/2018   IR RADIOLOGIST EVAL & MGMT  04/30/2019   KNEE CLOSED REDUCTION Left 02/03/2014   Procedure: CLOSED MANIPULATION LEFT KNEE;  Surgeon: Dempsey LULLA Moan, MD;  Location: [facility];  Service: Orthopedics;   Laterality: Left;   LAPAROSCOPIC CHOLECYSTECTOMY  1996   RADIOACTIVE PLAQUE INSERTION Left 09/17/2016   @ [facility] by dr c. cheree;   I-125 plaque application to left eye   RADIOACTIVE PLAQUE REMOVAL Left 09/21/2016   @[facility] by dr c. cheree;   left eye   RECTOCELE REPAIR N/A 11/18/2023   Procedure: LEVATOR PLICATION AND PERINEORRHAPHY;  Surgeon: Marilynne Rosaline SAILOR, MD;  Location: [facility];  Service: Gynecology;  Laterality: N/A;   REVERSE SHOULDER ARTHROPLASTY Left 06/28/2017   Procedure: LEFT SHOULDER REVERSE SHOULDER ARTHROPLASTY;  Surgeon: Kay Kemps, MD;  Location: [facility];  Service: Orthopedics;  Laterality: Left;   REVERSE SHOULDER ARTHROPLASTY Right 11/03/2021   Procedure: REVERSE SHOULDER ARTHROPLASTY;  Surgeon: Kay Kemps, MD;  Location: [facility];  Service: Orthopedics;  Laterality: Right;  with ISB   TOTAL KNEE ARTHROPLASTY Left 12/21/2013   Procedure: LEFT TOTAL KNEE ARTHROPLASTY;  Surgeon: Dempsey LULLA Moan, MD;  Location: [facility];  Service: Orthopedics;  Laterality: Left;   VARICOSE VEIN SURGERY Bilateral  laser ablation    Allergies  Allergies[1]  Home Medications    Prior to Admission medications  Medication Sig Start Date End Date Taking? Authorizing Provider  acetaminophen  (TYLENOL ) 500 MG tablet Take 1 tablet (500 mg total) by mouth every 6 (six) hours as needed (pain). 11/07/23   Zuleta, Kaitlin G, NP  albuterol  (VENTOLIN  HFA) 108 (90 Base) MCG/ACT inhaler Inhale 2 puffs into the lungs every 4 (four) hours as needed for wheezing or shortness of breath. 05/04/24   Johnny Garnette LABOR, MD  alendronate  (FOSAMAX ) 35 MG tablet TAKE 1 TABLET BY MOUTH WEEKLY  WITH 8 OZ OF PLAIN WATER  30  MINUTES BEFORE FIRST FOOD, DRINK OR MEDS. STAY UPRIGHT FOR 30  MINS 03/06/24   Johnny Garnette LABOR, MD  aspirin  EC 81 MG tablet Take 81 mg by mouth daily. Swallow whole.    [provider]  Calcium  Carb-Cholecalciferol  600-800 MG-UNIT TABS Take 2 tablets by mouth  daily.    [provider]  cetirizine (ZYRTEC) 10 MG tablet Take 10 mg by mouth daily as needed. PRN 09/07/16   [provider]  Cholecalciferol  (VITAMIN D ) 50 MCG (2000 UT) CAPS Take 1 capsule (2,000 Units total) by mouth daily. 01/17/22   Natalie Hall LABOR, DO  estradiol  (ESTRACE ) 0.1 MG/GM vaginal cream Place 0.5g nightly two times a week 01/09/24   Marilynne Rosaline SAILOR, MD  ezetimibe  (ZETIA ) 10 MG tablet TAKE 1 TABLET BY MOUTH DAILY 03/23/24   Hilty, Vinie BROCKS, MD  HYDROcodone  bit-homatropine (HYCODAN) 5-1.5 MG/5ML syrup Take 5 mLs by mouth every 4 (four) hours as needed. 08/04/24   Johnny Garnette LABOR, MD  LORazepam  (ATIVAN ) 0.5 MG tablet Take 1 tablet (0.5 mg total) by mouth every 8 (eight) hours as needed for anxiety. 05/04/24   Johnny Garnette LABOR, MD  Magnesium 250 MG TABS Take 1 tablet by mouth daily.    [provider]  metFORMIN  (GLUCOPHAGE ) 500 MG tablet Take 1 tablet (500 mg total) by mouth 2 (two) times daily with a meal. 09/28/24   Natalie Hall A, DO  metoprolol  succinate (TOPROL -XL) 25 MG 24 hr tablet TAKE 1 TABLET BY MOUTH TWICE  DAILY 03/23/24   Hilty, Vinie BROCKS, MD  Multiple Vitamins-Minerals (MULTIVITAL PO) Take 1 tablet by mouth daily.    [provider]  MYRBETRIQ  50 MG TB24 tablet TAKE 1 TABLET BY MOUTH DAILY 06/12/24   Marilynne Rosaline SAILOR, MD  omeprazole  (PRILOSEC) 40 MG capsule TAKE 1 CAPSULE BY MOUTH DAILY 03/23/24   Johnny Garnette LABOR, MD  Polyethyl Glycol-Propyl Glycol (SYSTANE ULTRA) 0.4-0.3 % SOLN Apply to eye as needed. PRN 02/01/22   [provider]  ramipril  (ALTACE ) 10 MG capsule TAKE 1 CAPSULE BY MOUTH DAILY 03/23/24   Mona Vinie BROCKS, MD  rosuvastatin  (CRESTOR ) 40 MG tablet TAKE 1 TABLET BY MOUTH DAILY 03/23/24   Hilty, Vinie BROCKS, MD  temazepam  (RESTORIL ) 15 MG capsule TAKE 1 CAPSULE BY MOUTH EVERY  NIGHT AT BEDTIME AS NEEDED FOR  SLEEP 06/26/24   Johnny Garnette LABOR, MD    Physical Exam    Vital Signs:  SHAWNEEQUA BALDRIDGE does not have vital signs  available for review today.  Given telephonic nature of communication, physical exam is limited. AAOx3. NAD. Normal affect.  Speech and respirations are unlabored.  Accessory Clinical Findings    None  Assessment & Plan    1.  Preoperative Cardiovascular Risk Assessment: According to the Revised Cardiac Risk Index (RCRI), her Perioperative Risk of Major Cardiac  Event is (%): 6.6. Her Functional Capacity in METs is: 5.07 according to the Duke Activity Status Index (DASI). The patient was advised that if she develops new symptoms prior to surgery to contact our office to arrange for a follow-up visit, and she verbalized understanding. Per AHA/ACC guidelines, she is deemed acceptable risk for the planned procedure without additional cardiovascular testing.   Ok to hold aspirin  up to 7 days prior to surgery if needed and restart after. -Dr. Mona  A copy of this note will be routed to requesting surgeon.  Time:   Today, I have spent 10 minutes with the patient with telehealth technology discussing medical history, symptoms, and management plan.     Saddie GORMAN Cleaves, NP  11/04/2024, 11:21 AM     [1]  Allergies Allergen Reactions   Codeine Shortness Of Breath, Rash and Other (See Comments)    Flushing, tolerates hydrocodone     Amoxicillin -Pot Clavulanate Rash     Has patient had a PCN reaction causing immediate rash, facial/tongue/throat swelling, SOB or lightheadedness with hypotension: No Has patient had a PCN reaction causing severe rash involving mucus membranes or skin necrosis: No Has patient had a PCN reaction that required hospitalization: No Has patient had a PCN reaction occurring within the last 10 years: Yes If all of the above answers are NO, then may proceed with Cephalosporin use.   Lexapro  [Escitalopram ] Rash   "

## 2024-11-03 NOTE — Telephone Encounter (Signed)
I did the referrals

## 2024-11-04 ENCOUNTER — Ambulatory Visit: Attending: Cardiology

## 2024-11-04 DIAGNOSIS — Z01810 Encounter for preprocedural cardiovascular examination: Secondary | ICD-10-CM | POA: Diagnosis not present

## 2024-11-04 NOTE — H&P (Signed)
 TOTAL KNEE ADMISSION H&P  Patient is being admitted for right total knee arthroplasty.  Subjective:  Chief Complaint: Right knee pain.  HPI: Natalie Hall, 74 y.o. female has a history of pain and functional disability in the right knee due to arthritis and has failed non-surgical conservative treatments for greater than 12 weeks to include NSAID's and/or analgesics, corticosteriod injections, viscosupplementation injections, and activity modification. Onset of symptoms was gradual, starting several years ago with gradually worsening course since that time. The patient noted no past surgery on the right knee.  Patient currently rates pain in the right knee at 8 out of 10 with activity. Patient has worsening of pain with activity and weight bearing and pain that interferes with activities of daily living. Patient has evidence of bone-on-bone arthritis in the lateral and patellofemoral compartments of the right knee with approximately a 10-degree valgus deformity by imaging studies. There is no active infection.  Patient Active Problem List   Diagnosis Date Noted   SUI (stress urinary incontinence, female) 09/10/2023   OAB (overactive bladder) 09/10/2023   Morbid obesity (HCC) 11/21/2022   BMI 32.0-32.9,adult 11/21/2022   Petechial rash 09/25/2022   Low HDL (under 40) 07/25/2022   Other constipation 07/23/2022   Class 3 severe obesity with serious comorbidity and body mass index (BMI) of 40.0 to 44.9 in adult (HCC) 07/23/2022   Aortic atherosclerosis 04/27/2022   Visceral fat 02/19/2022   Aplastic anemia 01/17/2022   S/P shoulder replacement, right 11/03/2021   Hemangioma 05/19/2021   History of malignant melanoma of eye 05/19/2021   Other seborrheic keratosis 05/19/2021   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 05/19/2021   Pain in right knee 05/25/2020   Insulin  resistance 12/02/2019   Insertional Achilles tendinopathy 04/10/2019   Vitamin D  deficiency 12/04/2018   Prediabetes  08/20/2018   PCO (posterior capsular opacification), left 04/18/2018   Class 3 severe obesity due to excess calories with serious comorbidity and body mass index (BMI) of 40.0 to 44.9 in adult [facility]) 12/30/2017   S/P shoulder replacement, left 06/28/2017   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   Osteopenia 12/28/2016   Coronary artery disease involving native coronary artery of native heart without angina pectoris 10/05/2016   Melanoma of eye, left (HCC) 08/21/2016   Choroidal malignant melanoma, 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   SVT (supraventricular tachycardia) 05/21/2016   AVNRT (AV nodal re-entry tachycardia)    Research study patient 03/22/2016   PSVT (paroxysmal supraventricular tachycardia) 03/22/2016   S/P coronary artery stent placement 03/15/2016   NSTEMI (non-ST elevated myocardial infarction) (HCC) 03/13/2016   Paroxysmal SVT (supraventricular tachycardia) 03/11/2016   Pseudophakia of both eyes 10/06/2015   Retinal hemorrhage of right eye 10/06/2015   Depression 02/03/2015   GERD (gastroesophageal reflux disease) 12/22/2013   Degeneration of intervertebral disc, site unspecified 12/22/2013   Preoperative cardiovascular examination 11/11/2013   Chronic venous insufficiency 03/18/2009   Hyperlipidemia 11/24/2008   Essential hypertension 11/24/2008   Asthma 11/24/2008   Osteoarthritis 11/24/2008    Past Medical History:  Diagnosis Date   Anxiety    Asthma    followe by pcp'  uncomplicated,  rescue inhaler prn   Chronic constipation    Chronic low back pain    Coronary artery disease 2017   cardiologist--- dr mona;   NSTEMI  w/ positive NUC 03-13-2016 in setting 8 hrs SVT;  cath 03-14-2016  w/ PCI and DES to mid to distal RCA,  normal LVEF   Fatty liver    followed by  pcp  ;  in epic abd ultrasound 05-15-2023   GERD (gastroesophageal reflux disease)    Hiatal hernia    History of non-ST elevation myocardial infarction (NSTEMI) 03/11/2016   Hx of adenomatous colonic polyps     Hyperlipidemia, mixed    Hypertension    Malignant melanoma of eye, left (HCC) 2017   followed by dr c. 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 Jane MO SURGERY Left 07/05/2003   @[facility]  by dr jane;   peroneal tendon repair   BIOPSY  11/18/2023   Procedure: BLADDER BIOPSY;  Surgeon: Marilynne Rosaline SAILOR, MD;  Location: [facility];  Service: Gynecology;;   BLADDER SUSPENSION N/A 11/18/2023   Procedure: TRANSVAGINAL TAPE (TVT) PROCEDURE--Altis Sling;  Surgeon: Marilynne Rosaline SAILOR, MD;  Location: [facility];  Service: Gynecology;  Laterality: N/A;   CARDIAC CATHETERIZATION N/A 03/14/2016   Procedure: Left Heart Cath and Coronary Angiography;  Surgeon: Peter M Jordan, MD;  Location: [facility] INVASIVE CV LAB;  Service: Cardiovascular;  Laterality: N/A;   CARDIAC CATHETERIZATION N/A 03/14/2016   Procedure: Coronary Stent Intervention;  Surgeon: Peter M Jordan, MD;  Location: [facility] INVASIVE CV LAB;  Service: Cardiovascular;  Laterality: N/A;   CARPAL TUNNEL RELEASE Left 04/01/2001   @ [facility] by dr sypher;   And left thumb pulley release   CATARACT EXTRACTION W/ INTRAOCULAR LENS IMPLANT Bilateral 2014   COLONOSCOPY  08/14/2021   per Dr. Teressa, precancerous polyps, repeat in 7 yrs   COLONOSCOPY WITH PROPOFOL   08/14/2021   by dr d. teressa    COLPOCLEISIS N/A 11/18/2023   Procedure: COLPOCLEISIS;  Surgeon: Marilynne Rosaline SAILOR, MD;  Location: [facility];  Service: Gynecology;  Laterality: N/A;   CYSTOSCOPY N/A 11/18/2023   Procedure: CYSTOSCOPY;  Surgeon: Marilynne Rosaline SAILOR, MD;  Location: [facility];  Service: Gynecology;  Laterality: N/A;   ELECTROPHYSIOLOGIC STUDY N/A 05/21/2016   Procedure: SVT Ablation;  Surgeon: Will Gladis Norton, MD;  Location: [facility] INVASIVE CV LAB;  Service: Cardiovascular;  Laterality: N/A;   EXCISION HAGLUND'S DEFORMITY WITH ACHILLES TENDON REPAIR Left 07/02/2019   Procedure: Left Achilles tendon debridement and reconstruction and excision of Haglund deformity;  Surgeon: Kit Rush, MD;  Location: [facility];  Service: Orthopedics;  Laterality: Left;   GASTROCNEMIUS RECESSION Left 07/02/2019   Procedure: Gastroc recession;  Surgeon: Kit Rush, MD;  Location: [facility];  Service: Orthopedics;  Laterality: Left;   IR KYPHO THORACIC WITH BONE BIOPSY  12/18/2018   IR RADIOLOGIST EVAL & MGMT  12/09/2018   IR RADIOLOGIST EVAL & MGMT  04/30/2019   KNEE CLOSED REDUCTION Left 02/03/2014   Procedure: CLOSED MANIPULATION LEFT KNEE;  Surgeon: Dempsey LULLA Moan, MD;  Location: [facility];  Service: Orthopedics;  Laterality: Left;  LAPAROSCOPIC CHOLECYSTECTOMY  1996   RADIOACTIVE PLAQUE INSERTION Left 09/17/2016   @ [facility] by dr c. cheree;   I-125 plaque application to left eye   RADIOACTIVE PLAQUE REMOVAL Left 09/21/2016   @[facility] by dr c. cheree;   left eye   RECTOCELE REPAIR N/A 11/18/2023   Procedure: LEVATOR PLICATION AND PERINEORRHAPHY;  Surgeon: Marilynne Rosaline SAILOR, MD;  Location: [facility];  Service: Gynecology;  Laterality: N/A;   REVERSE SHOULDER ARTHROPLASTY Left 06/28/2017   Procedure: LEFT SHOULDER REVERSE SHOULDER ARTHROPLASTY;  Surgeon: Kay Kemps, MD;  Location[facility];  Service: Orthopedics;  Laterality:  Left;   REVERSE SHOULDER ARTHROPLASTY Right 11/03/2021   Procedure: REVERSE SHOULDER ARTHROPLASTY;  Surgeon: Kay Kemps, MD;  Location: [facility];  Service: Orthopedics;  Laterality: Right;  with ISB   TOTAL KNEE ARTHROPLASTY Left 12/21/2013   Procedure: LEFT TOTAL KNEE ARTHROPLASTY;  Surgeon: Dempsey LULLA Moan, MD;  Location: [facility];  Service: Orthopedics;  Laterality: Left;   VARICOSE VEIN SURGERY Bilateral    laser ablation    Prior to Admission medications  Medication Sig Start Date End Date Taking? Authorizing Provider  acetaminophen  (TYLENOL ) 500 MG tablet Take 1 tablet (500 mg total) by mouth every 6 (six) hours as needed (pain). 11/07/23   Zuleta, Kaitlin G, NP  albuterol  (VENTOLIN  HFA) 108 (90 Base) MCG/ACT inhaler Inhale 2 puffs into the lungs every 4 (four) hours as needed for wheezing or shortness of breath. 05/04/24   Johnny Garnette LABOR, MD  alendronate  (FOSAMAX ) 35 MG tablet TAKE 1 TABLET BY MOUTH WEEKLY  WITH 8 OZ OF PLAIN WATER  30  MINUTES BEFORE FIRST FOOD, DRINK OR MEDS. STAY UPRIGHT FOR 30  MINS 03/06/24   Johnny Garnette LABOR, MD  aspirin  EC 81 MG tablet Take 81 mg by mouth daily. Swallow whole.    [provider]  Calcium  Carb-Cholecalciferol  600-800 MG-UNIT TABS Take 2 tablets by mouth daily.    [provider]  cetirizine (ZYRTEC) 10 MG tablet Take 10 mg by mouth daily as needed. PRN 09/07/16   [provider]  Cholecalciferol  (VITAMIN D ) 50 MCG (2000 UT) CAPS Take 1 capsule (2,000 Units total) by mouth daily. 01/17/22   Delores Shields A, DO  estradiol  (ESTRACE ) 0.1 MG/GM vaginal cream Place 0.5g nightly two times a week 01/09/24   Marilynne Rosaline SAILOR, MD  ezetimibe  (ZETIA ) 10 MG tablet TAKE 1 TABLET BY MOUTH DAILY 03/23/24   Hilty, Vinie BROCKS, MD  HYDROcodone  bit-homatropine (HYCODAN) 5-1.5 MG/5ML syrup Take 5 mLs by mouth every 4 (four) hours as needed. 08/04/24   Johnny Garnette LABOR, MD  LORazepam  (ATIVAN ) 0.5 MG tablet Take 1 tablet (0.5 mg total) by mouth every 8  (eight) hours as needed for anxiety. 05/04/24   Johnny Garnette LABOR, MD  Magnesium 250 MG TABS Take 1 tablet by mouth daily.    [provider]  metFORMIN  (GLUCOPHAGE ) 500 MG tablet Take 1 tablet (500 mg total) by mouth 2 (two) times daily with a meal. 09/28/24   Delores Shields A, DO  metoprolol  succinate (TOPROL -XL) 25 MG 24 hr tablet TAKE 1 TABLET BY MOUTH TWICE  DAILY 03/23/24   Hilty, Vinie BROCKS, MD  Multiple Vitamins-Minerals (MULTIVITAL PO) Take 1 tablet by mouth daily.    [provider]  MYRBETRIQ  50 MG TB24 tablet TAKE 1 TABLET BY MOUTH DAILY 06/12/24   Marilynne Rosaline SAILOR, MD  omeprazole  (PRILOSEC) 40 MG capsule TAKE 1 CAPSULE BY MOUTH DAILY 03/23/24   Johnny Garnette  A, MD  Polyethyl Glycol-Propyl Glycol (SYSTANE ULTRA) 0.4-0.3 % SOLN Apply to eye as needed. PRN 02/01/22   [provider]  ramipril  (ALTACE ) 10 MG capsule TAKE 1 CAPSULE BY MOUTH DAILY 03/23/24   Mona Vinie BROCKS, MD  rosuvastatin  (CRESTOR ) 40 MG tablet TAKE 1 TABLET BY MOUTH DAILY 03/23/24   Hilty, Vinie BROCKS, MD  temazepam  (RESTORIL ) 15 MG capsule TAKE 1 CAPSULE BY MOUTH EVERY  NIGHT AT BEDTIME AS NEEDED FOR  SLEEP 06/26/24   Johnny Garnette LABOR, MD    Allergies[1]  Social History   Socioeconomic History   Marital status: Married    Spouse name: Actuary   Number of children: Not on file   Years of education: Not on file   Highest education level: Associate degree: occupational, scientist, product/process development, or vocational program  Occupational History   Occupation: retired  Tobacco Use   Smoking status: Never   Smokeless tobacco: Never  Vaping Use   Vaping status: Never Used  Substance and Sexual Activity   Alcohol use: No    Alcohol/week: 0.0 standard drinks of alcohol   Drug use: Never   Sexual activity: Not Currently  Other Topics Concern   Not on file  Social History Narrative   Not on file   Social Drivers of Health   Tobacco Use: Low Risk (11/04/2024)   Patient History    Smoking Tobacco Use: Never    Smokeless  Tobacco Use: Never    Passive Exposure: Not on file  Financial Resource Strain: Low Risk (08/02/2024)   Overall Financial Resource Strain (CARDIA)    Difficulty of Paying Living Expenses: Not hard at all  Food Insecurity: No Food Insecurity (08/02/2024)   Epic    Worried About Radiation Protection Practitioner of Food in the Last Year: Never true    Ran Out of Food in the Last Year: Never true  Transportation Needs: No Transportation Needs (08/02/2024)   Epic    Lack of Transportation (Medical): No    Lack of Transportation (Non-Medical): No  Physical Activity: Inactive (08/02/2024)   Exercise Vital Sign    Days of Exercise per Week: 0 days    Minutes of Exercise per Session: Not on file  Stress: Stress Concern Present (08/02/2024)   [employer] of Occupational Health - Occupational Stress Questionnaire    Feeling of Stress: To some extent  Social Connections: Socially Integrated (08/02/2024)   Social Connection and Isolation Panel    Frequency of Communication with Friends and Family: More than three times a week    Frequency of Social Gatherings with Friends and Family: More than three times a week    Attends Religious Services: More than 4 times per year    Active Member of [employer] or Organizations: Yes    Attends Banker Meetings: More than 4 times per year    Marital Status: Married  Catering Manager Violence: Not At Risk (12/21/2021)   Humiliation, Afraid, Rape, and Kick questionnaire    Fear of Current or Ex-Partner: No    Emotionally Abused: No    Physically Abused: No    Sexually Abused: No  Depression (PHQ2-9): Low Risk (01/21/2024)   Depression (PHQ2-9)    PHQ-2 Score: 0  Alcohol Screen: Low Risk (12/21/2021)   Alcohol Screen    Last Alcohol Screening Score (AUDIT): 0  Housing: Low Risk (08/02/2024)   Epic    Unable to Pay for Housing in the Last Year: No    Number of Times Moved in the Last  Year: 0    Homeless in the Last Year: No  Utilities: Not on file  Health  Literacy: Not on file    Tobacco Use: Low Risk (11/04/2024)   Patient History    Smoking Tobacco Use: Never    Smokeless Tobacco Use: Never    Passive Exposure: Not on file   Social History   Substance and Sexual Activity  Alcohol Use No   Alcohol/week: 0.0 standard drinks of alcohol    Family History  Problem Relation Age of Onset   Colon polyps Mother    Diabetes Mother    High blood pressure Mother    High Cholesterol Mother    Thyroid  disease Mother    Anxiety disorder Mother    Diabetes Father    High blood pressure Father    High Cholesterol Father    Heart disease Father    Depression Father    Obesity Father    Lung cancer Brother        spleen, bone   Colon cancer Maternal Aunt    Liver cancer Maternal Aunt    Breast cancer Other    Coronary artery disease Other    Colon cancer Other    Diabetes Other    Hypertension Other    Kidney disease Other    Lung cancer Other    Esophageal cancer Neg Hx    Stomach cancer Neg Hx    Rectal cancer Neg Hx    Bladder Cancer Neg Hx    Uterine cancer Neg Hx     ROS  Objective:  Physical Exam: - Constitutional: Female, alert, in no apparent distress. Musculoskeletal: - Right hip: Normal range of motion with no discomfort. - Right knee: Approximately 10-degree valgus deformity. Range of motion 5-110. Crepitus noted on range of motion. Tenderness lateral greater than medial. No instability observed. Antalgic gait on the right. - Left knee: Well-healed scar from previous arthroplasty. Range of motion 0-110. No tenderness or instability.  IMAGING: Radiographs demonstrate bone-on-bone arthritis in the lateral and patellofemoral compartments of the right knee with approximately a 10-degree valgus deformity. Prosthesis on the left knee is in excellent position with no abnormalities.  Assessment/Plan:  End stage arthritis, right knee   The patient history, physical examination, clinical judgment of the provider and  imaging studies are consistent with end stage degenerative joint disease of the right knee and total knee arthroplasty is deemed medically necessary. The treatment options including medical management, injection therapy arthroscopy and arthroplasty were discussed at length. The risks and benefits of total knee arthroplasty were presented and reviewed. The risks due to aseptic loosening, infection, stiffness, patella tracking problems, thromboembolic complications and other imponderables were discussed. The patient acknowledged the explanation, agreed to proceed with the plan and consent was signed. Patient is being admitted for inpatient treatment for surgery, pain control, PT, OT, prophylactic antibiotics, VTE prophylaxis, progressive ambulation and ADLs and discharge planning. The patient is planning to be discharged home.   Patient's anticipated LOS is less than 2 midnights, meeting these requirements: - Younger than 66 - Lives within 1 hour of care - Has a competent adult at home to recover with post-op recover - NO history of  - Chronic pain requiring opiods  - Heart failure  - Heart attack  - Stroke  - DVT/VTE  - Cardiac arrhythmia  - Respiratory Failure/COPD  - Renal failure  - Anemia  - Advanced Liver disease   Therapy Plans: [facility] PT Disposition: Home with husband and  son Planned DVT Prophylaxis: Aspirin  81mg  BID  DME Needed: Vannie ordered PCP: Garnette Olmsted, MD (clearance received) Cardiologist: Vinie Maxcy, MD (clearance received) TXA: IV Allergies: Augmentin  (rash), Lexapro  (rash), levofloxacin  (leg pain), codeine (rash) Anesthesia Concerns: None BMI: 32.7 Last HgbA1c: Pre-diabetic, 6.3% in July Pain Regimen: Oxycodone , tramadol  Pharmacy: [facility]   Other: -Hold ASA 5 days prior to surgery  - Patient was instructed on what medications to stop prior to surgery. - Follow-up visit in 2 weeks with Dr. Melodi - Begin physical therapy following surgery -  Pre-operative lab work as pre-surgical testing - Prescriptions will be provided in hospital at time of discharge  Corean Sender, PA-C Orthopedic Surgery [facility] [place]       [1]  Allergies Allergen Reactions   Codeine Shortness Of Breath, Rash and Other (See Comments)    Flushing, tolerates hydrocodone     Amoxicillin -Pot Clavulanate Rash     Has patient had a PCN reaction causing immediate rash, facial/tongue/throat swelling, SOB or lightheadedness with hypotension: No Has patient had a PCN reaction causing severe rash involving mucus membranes or skin necrosis: No Has patient had a PCN reaction that required hospitalization: No Has patient had a PCN reaction occurring within the last 10 years: Yes If all of the above answers are NO, then may proceed with Cephalosporin use.   Lexapro  [Escitalopram ] Rash

## 2024-11-05 ENCOUNTER — Encounter: Payer: Self-pay | Admitting: Bariatrics

## 2024-11-05 ENCOUNTER — Ambulatory Visit: Admitting: Bariatrics

## 2024-11-05 VITALS — BP 124/60 | HR 71 | Ht 64.0 in | Wt 193.0 lb

## 2024-11-05 DIAGNOSIS — Z6833 Body mass index (BMI) 33.0-33.9, adult: Secondary | ICD-10-CM | POA: Diagnosis not present

## 2024-11-05 DIAGNOSIS — I515 Myocardial degeneration: Secondary | ICD-10-CM

## 2024-11-05 DIAGNOSIS — E669 Obesity, unspecified: Secondary | ICD-10-CM

## 2024-11-05 DIAGNOSIS — R7303 Prediabetes: Secondary | ICD-10-CM

## 2024-11-05 DIAGNOSIS — E6609 Other obesity due to excess calories: Secondary | ICD-10-CM

## 2024-11-05 NOTE — Progress Notes (Addendum)
 "                                                                                                             WEIGHT SUMMARY AND BIOMETRICS  Weight Lost Since Last Visit: 0  Weight Gained Since Last Visit: 2lb   Vitals BP: 124/60 Pulse Rate: 71 SpO2: 99 %   Anthropometric Measurements Height: 5' 4 (1.626 m) Weight: 193 lb (87.5 kg) BMI (Calculated): 33.11 Weight at Last Visit: 191lb Weight Lost Since Last Visit: 0 Weight Gained Since Last Visit: 2lb Starting Weight: 213lb Total Weight Loss (lbs): 20 lb (9.072 kg)   Body Composition  Body Fat %: 50 % Fat Mass (lbs): 97 lbs Muscle Mass (lbs): 91.8 lbs Total Body Water  (lbs): 68 lbs Visceral Fat Rating : 15   Other Clinical Data Fasting: no Labs: no Today's Visit #: 45 Starting Date: 08/04/18    OBESITY Minna is here to discuss her progress with her obesity treatment plan along with follow-up of her obesity related diagnoses.    Nutrition Plan: the Category 2 plan - 60-70% adherence.  Current exercise: none  Interim History:  She is up 2 lbs since her last visit.  The bioimpedance scale shows that her muscle mass has gone up slightly as she has been doing exercises prior to her surgery and also her body water  is up slightly.  She has been scheduled for a right knee replacement on February 2. She will have extensive lab work prior to surgery and we can review at her next visit. Eating all of the food on the plan., Protein intake is as prescribed, Is skipping meals, and Water  intake is adequate.   Pharmacotherapy: Iris is on Metformin  500 mg twice daily with meals Adverse side effects: None Hunger is moderately controlled.  Cravings are moderately controlled.  Assessment/Plan:   Prediabetes Last A1c was 6.3  Medication(s): Metformin  500 mg twice daily with meals Lab Results  Component Value Date   HGBA1C 6.3 05/04/2024   HGBA1C 6.0 04/29/2023   HGBA1C 6.0 04/27/2022   HGBA1C 6.1 (H) 10/20/2021    HGBA1C 5.9 04/13/2021   Lab Results  Component Value Date   INSULIN  19.4 04/29/2023   INSULIN  18.6 07/23/2022   INSULIN  16.6 12/06/2020   INSULIN  18.5 11/17/2019   INSULIN  15.4 11/20/2018    Plan: Will minimize all refined carbohydrates both sweets and starches.  Will work on the plan and exercise.  Consider both aerobic and resistance training.  Will keep protein, water , and fiber intake high.  Increase Polyunsaturated and Monounsaturated fats to increase satiety and encourage weight loss.  Continue Metformin  500 mg BID.  She will keep her protein and fluids up post surgery. She will minimize her carbohydrates. She will be diligent with her PT exercises 2-3 times a week.   Visceral Obesity.  She has a visceral fat rating of 15 per the bio-impedence scale.   Plan: The goal is a visceral fat rating of 13 or below.  Will work on the plan and increase exercise/begin exercise.  Will increase her protein and minimize any starches. Will minimize all carbohydrates ( sweets and starches ).     Generalized Obesity: Current BMI BMI (Calculated): 33.11   Pharmacotherapy Plan dContinue Metformin  500 mg twice daily with meals  Madilynne is currently in the action stage of change. As such, her goal is to continue with weight loss efforts.  She has agreed to the Category 2 plan.  Exercise goals: Older adults should do exercises that maintain or improve balance if they are at risk of falling.   Behavioral modification strategies: increasing lean protein intake, no meal skipping, meal planning , increase water  intake, better snacking choices, planning for success, increasing vegetables, and keep healthy foods in the home.  Tallie has agreed to follow-up with our clinic in 4 weeks.     Objective:   VITALS: Per patient if applicable, see vitals. GENERAL: Alert and in no acute distress. CARDIOPULMONARY: No increased WOB. Speaking in clear sentences.  PSYCH: Pleasant and cooperative.  Speech normal rate and rhythm. Affect is appropriate. Insight and judgement are appropriate. Attention is focused, linear, and appropriate.  NEURO: Oriented as arrived to appointment on time with no prompting.   Attestation Statements:   This was prepared with the assistance of Engineer, Civil (consulting).  Occasional wrong-word or sound-a-like substitutions may have occurred due to the inherent limitations of voice recognition.   Clayborne Daring, DO    "

## 2024-11-11 NOTE — Progress Notes (Signed)
 Date of COVID positive in last 90 days:  PCP - Garnette Olmsted, MD Cardiologist - Vinie Maxcy, MD  Medical  clearance in media tab dated 09/10/24 Cardiac clearance by Saddie Cleaves, NP 11/04/24 Epic  Chest x-ray - 05/06/24 Epic EKG - 01/10/24 Epic Stress Test - 03/13/16 Epic ECHO - 03/12/16 Epic Cardiac Cath - 03/14/2016 Epic Pacemaker/ICD device last checked:N/A Spinal Cord Stimulator:N/A  Bowel Prep - N/A  Sleep Study - N/A CPAP -   Fasting Blood Sugar - preDM, no checks at home Checks Blood Sugar _____ times a day  Last dose of GLP1 agonist-  N/A GLP1 instructions:  Do not take after     Last dose of SGLT-2 inhibitors-  N/A SGLT-2 instructions:  Do not take after     Blood Thinner Instructions: N/A Last dose:   Time: Aspirin  Instructions: ASA 81, hold 5 days Last Dose:  Activity level: Can go up a flight of stairs and perform activities of daily living without stopping and without symptoms of chest pain or shortness of breath. Has cane to use PRN   Anesthesia review: HTN, SVT, NSTEMI, , CAD, aortic atherosclerosis, asthma, aplastic anemia  Patient denies shortness of breath, fever, cough and chest pain at PAT appointment  Patient verbalized understanding of instructions that were given to them at the PAT appointment. Patient was also instructed that they will need to review over the PAT instructions again at home before surgery.

## 2024-11-11 NOTE — Patient Instructions (Signed)
 SURGICAL WAITING ROOM VISITATION  Patients having surgery or a procedure may have no more than 2 support people in the waiting area - these visitors may rotate.    Children ages 61 and under will not be able to visit patients in [facility] under most circumstances.   Visitors with respiratory illnesses are discouraged from visiting and should remain at home.  If the patient needs to stay at the hospital during part of their recovery, the visitor guidelines for inpatient rooms apply. Pre-op nurse will coordinate an appropriate time for 1 support person to accompany patient in pre-op.  This support person may not rotate.    Please refer to the [facility] website for the visitor guidelines for Inpatients (after your surgery is over and you are in a regular room).    Your procedure is scheduled on: 11/23/24   Report to [facility] Main Entrance    Report to admitting at 8:00 AM   Call this number if you have problems the morning of surgery [phone]   Do not eat food :After Midnight.   After Midnight you may have the following liquids until 7:30 AM DAY OF SURGERY  Water  Non-Citrus Juices (without pulp, NO RED-Apple, White grape, White cranberry) Black Coffee (NO MILK/CREAM OR CREAMERS, sugar ok)  Clear Tea (NO MILK/CREAM OR CREAMERS, sugar ok) regular and decaf                             Plain Jell-O (NO RED)                                           Fruit ices (not with fruit pulp, NO RED)                                     Popsicles (NO RED)                                                               Sports drinks like Gatorade (NO RED)                 The day of surgery:  Drink ONE (1) Pre-Surgery G2 at 7:30 AM the morning of surgery. Drink in one sitting. Do not sip.  This drink was given to you during your hospital  pre-op appointment visit. Nothing else to drink after completing the  Pre-Surgery G2.          If you have questions, please contact  your surgeons office.   FOLLOW BOWEL PREP AND ANY ADDITIONAL PRE OP INSTRUCTIONS YOU RECEIVED FROM YOUR SURGEON'S OFFICE!!!     Oral Hygiene is also important to reduce your risk of infection.                                    Remember - BRUSH YOUR TEETH THE MORNING OF SURGERY WITH YOUR REGULAR TOOTHPASTE  DENTURES WILL BE REMOVED PRIOR TO SURGERY PLEASE DO NOT APPLY Poly grip  OR ADHESIVES!!!   Do NOT smoke after Midnight   Stop all vitamins and herbal supplements 7 days before surgery.   Take these medicines the morning of surgery with A SIP OF WATER :  Tylenol  Inhalers Cetirizine (Zyrtec) Ezetimibe  (Zetia ) Metoprolol  (Toprol ) Pantoprazole  (Protonix ) Rosuvastatin  (Crestor )                              You may not have any metal on your body including hair pins, jewelry, and body piercing             Do not wear make-up, lotions, powders, perfumes, or deodorant  Do not wear nail polish including gel and S&S, artificial/acrylic nails, or any other type of covering on natural nails including finger and toenails. If you have artificial nails, gel coating, etc. that needs to be removed by a nail salon please have this removed prior to surgery or surgery may need to be canceled/ delayed if the surgeon/ anesthesia feels like they are unable to be safely monitored.   Do not shave  48 hours prior to surgery.    Do not bring valuables to the hospital.  IS NOT             RESPONSIBLE   FOR VALUABLES.   Contacts, glasses, dentures or bridgework may not be worn into surgery.   Bring small overnight bag day of surgery.   DO NOT BRING YOUR HOME MEDICATIONS TO THE HOSPITAL. PHARMACY WILL DISPENSE MEDICATIONS LISTED ON YOUR MEDICATION LIST TO YOU DURING YOUR ADMISSION IN THE HOSPITAL!              Please read over the following fact sheets you were given: IF YOU HAVE QUESTIONS ABOUT YOUR PRE-OP INSTRUCTIONS PLEASE CALL [phone]-Abel Ra.   If you received a COVID test  during your pre-op visit  it is requested that you wear a mask when out in public, stay away from anyone that may not be feeling well and notify your surgeon if you develop symptoms. If you test positive for Covid or have been in contact with anyone that has tested positive in the last 10 days please notify you surgeon.      Pre-operative 4 CHG Bath Instructions  DYNA-Hex 4 Chlorhexidine  Gluconate 4% Solution Antiseptic 4 fl. oz   You can play a key role in reducing the risk of infection after surgery. Your skin needs to be as free of germs as possible. You can reduce the number of germs on your skin by washing with CHG (chlorhexidine  gluconate) soap before surgery. CHG is an antiseptic soap that kills germs and continues to kill germs even after washing.   DO NOT use if you have an allergy to chlorhexidine /CHG or antibacterial soaps. If your skin becomes reddened or irritated, stop using the CHG and notify one of our RNs at   Please shower with the CHG soap starting 4 days before surgery using the following schedule:     Please keep in mind the following:  DO NOT shave, including legs and underarms, starting the day of your first shower.   You may shave your face at any point before/day of surgery.  Place clean sheets on your bed the day you start using CHG soap. Use a clean washcloth (not used since being washed) for each shower. DO NOT sleep with pets once you start using the CHG.  CHG Shower Instructions:  If you choose to wash your hair  and private area, wash first with your normal shampoo/soap.  After you use shampoo/soap, rinse your hair and body thoroughly to remove shampoo/soap residue.  Turn the water  OFF and apply about 3 tablespoons (45 ml) of CHG soap to a CLEAN washcloth.  Apply CHG soap ONLY FROM YOUR NECK DOWN TO YOUR TOES (washing for 3-5 minutes)  DO NOT use CHG soap on face, private areas, open wounds, or sores.  Pay special attention to the area where your surgery is  being performed.  If you are having back surgery, having someone wash your back for you may be helpful. Wait 2 minutes after CHG soap is applied, then you may rinse off the CHG soap.  Pat dry with a clean towel  Put on clean clothes/pajamas   If you choose to wear lotion, please use ONLY the CHG-compatible lotions on the back of this paper.     Additional instructions for the day of surgery: DO NOT APPLY any lotions, deodorants, cologne, or perfumes.   Put on clean/comfortable clothes.  Brush your teeth.  Ask your nurse before applying any prescription medications to the skin.   CHG Compatible Lotions   Aveeno Moisturizing lotion  Cetaphil Moisturizing Cream  Cetaphil Moisturizing Lotion  Clairol Herbal Essence Moisturizing Lotion, Dry Skin  Clairol Herbal Essence Moisturizing Lotion, Extra Dry Skin  Clairol Herbal Essence Moisturizing Lotion, Normal Skin  Curel Age Defying Therapeutic Moisturizing Lotion with Alpha Hydroxy  Curel Extreme Care Body Lotion  Curel Soothing Hands Moisturizing Hand Lotion  Curel Therapeutic Moisturizing Cream, Fragrance-Free  Curel Therapeutic Moisturizing Lotion, Fragrance-Free  Curel Therapeutic Moisturizing Lotion, Original Formula  Eucerin Daily Replenishing Lotion  Eucerin Dry Skin Therapy Plus Alpha Hydroxy Crme  Eucerin Dry Skin Therapy Plus Alpha Hydroxy Lotion  Eucerin Original Crme  Eucerin Original Lotion  Eucerin Plus Crme Eucerin Plus Lotion  Eucerin TriLipid Replenishing Lotion  Keri Anti-Bacterial Hand Lotion  Keri Deep Conditioning Original Lotion Dry Skin Formula Softly Scented  Keri Deep Conditioning Original Lotion, Fragrance Free Sensitive Skin Formula  Keri Lotion Fast Absorbing Fragrance Free Sensitive Skin Formula  Keri Lotion Fast Absorbing Softly Scented Dry Skin Formula  Keri Original Lotion  Keri Skin Renewal Lotion Keri Silky Smooth Lotion  Keri Silky Smooth Sensitive Skin Lotion  Nivea Body Creamy Conditioning  Oil  Nivea Body Extra Enriched Lotion  Nivea Body Original Lotion  Nivea Body Sheer Moisturizing Lotion Nivea Crme  Nivea Skin Firming Lotion  NutraDerm 30 Skin Lotion  NutraDerm Skin Lotion  NutraDerm Therapeutic Skin Cream  NutraDerm Therapeutic Skin Lotion  ProShield Protective Hand Cream  Provon moisturizing lotion  View Pre-Surgery Education Videos:  [email or web address]    - Preparing for Surgery Before surgery, you can play an important role.  Because skin is not sterile, your skin needs to be as free of germs as possible.  You can reduce the number of germs on your skin by washing with CHG (chlorahexidine gluconate) soap before surgery.  CHG is an antiseptic cleaner which kills germs and bonds with the skin to continue killing germs even after washing. Please DO NOT use if you have an allergy to CHG or antibacterial soaps.  If your skin becomes reddened/irritated stop using the CHG and inform your nurse when you arrive at Short Stay. Do not shave (including legs and underarms) for at least 48 hours prior to the first CHG shower.  You may shave your face/neck.  Please follow these instructions carefully:  1.  Shower with CHG Soap  the night before surgery ONLY (DO NOT USE THE SOAP THE MORNING OF SURGERY).  2.  If you choose to wash your hair, wash your hair first as usual with your normal  shampoo.  3.  After you shampoo, rinse your hair and body thoroughly to remove the shampoo.                             4.  Use CHG as you would any other liquid soap.  You can apply chg directly to the skin and wash.  Gently with a scrungie or clean washcloth.  5.  Apply the CHG Soap to your body ONLY FROM THE NECK DOWN.   Do   not use on face/ open                           Wound or open sores. Avoid contact with eyes, ears mouth and   genitals (private parts).                       Wash face,  Genitals (private parts) with  your normal soap.             6.  Wash thoroughly, paying special attention to the area where your    surgery  will be performed.  7.  Thoroughly rinse your body with warm water  from the neck down.  8.  DO NOT shower/wash with your normal soap after using and rinsing off the CHG Soap.                9.  Pat yourself dry with a clean towel.            10.  Wear clean pajamas.            11.  Place clean sheets on your bed the night of your first shower and do not  sleep with pets. Day of Surgery : Do not apply any CHG, lotions/deodorants the morning of surgery.  Please wear clean clothes to the hospital/surgery center.  FAILURE TO FOLLOW THESE INSTRUCTIONS MAY RESULT IN THE CANCELLATION OF YOUR SURGERY  PATIENT SIGNATURE_________________________________  NURSE SIGNATURE__________________________________  ________________________________________________________________________

## 2024-11-12 ENCOUNTER — Encounter (HOSPITAL_COMMUNITY): Payer: Self-pay

## 2024-11-12 ENCOUNTER — Encounter (HOSPITAL_COMMUNITY)
Admission: RE | Admit: 2024-11-12 | Discharge: 2024-11-12 | Disposition: A | Discharge status: Home / Self Care | Source: Ambulatory Visit | Attending: Orthopedic Surgery | Admitting: Orthopedic Surgery

## 2024-11-12 ENCOUNTER — Other Ambulatory Visit: Payer: Self-pay

## 2024-11-12 VITALS — BP 145/68 | HR 68 | Temp 98.0°F | Resp 16 | Ht 63.0 in | Wt 197.0 lb

## 2024-11-12 DIAGNOSIS — I2510 Atherosclerotic heart disease of native coronary artery without angina pectoris: Secondary | ICD-10-CM | POA: Diagnosis not present

## 2024-11-12 DIAGNOSIS — K219 Gastro-esophageal reflux disease without esophagitis: Secondary | ICD-10-CM | POA: Insufficient documentation

## 2024-11-12 DIAGNOSIS — Z01812 Encounter for preprocedural laboratory examination: Secondary | ICD-10-CM | POA: Insufficient documentation

## 2024-11-12 DIAGNOSIS — I471 Supraventricular tachycardia, unspecified: Secondary | ICD-10-CM | POA: Diagnosis not present

## 2024-11-12 DIAGNOSIS — Z955 Presence of coronary angioplasty implant and graft: Secondary | ICD-10-CM | POA: Diagnosis not present

## 2024-11-12 DIAGNOSIS — I252 Old myocardial infarction: Secondary | ICD-10-CM | POA: Diagnosis not present

## 2024-11-12 DIAGNOSIS — J45909 Unspecified asthma, uncomplicated: Secondary | ICD-10-CM | POA: Diagnosis not present

## 2024-11-12 DIAGNOSIS — Z6834 Body mass index (BMI) 34.0-34.9, adult: Secondary | ICD-10-CM | POA: Diagnosis not present

## 2024-11-12 DIAGNOSIS — E669 Obesity, unspecified: Secondary | ICD-10-CM | POA: Diagnosis not present

## 2024-11-12 DIAGNOSIS — Z7982 Long term (current) use of aspirin: Secondary | ICD-10-CM | POA: Diagnosis not present

## 2024-11-12 DIAGNOSIS — I10 Essential (primary) hypertension: Secondary | ICD-10-CM | POA: Diagnosis not present

## 2024-11-12 DIAGNOSIS — K449 Diaphragmatic hernia without obstruction or gangrene: Secondary | ICD-10-CM | POA: Diagnosis not present

## 2024-11-12 DIAGNOSIS — Z01818 Encounter for other preprocedural examination: Secondary | ICD-10-CM

## 2024-11-12 DIAGNOSIS — M1711 Unilateral primary osteoarthritis, right knee: Secondary | ICD-10-CM | POA: Diagnosis not present

## 2024-11-12 LAB — CBC
HCT: 37.7 % (ref 36.0–46.0)
Hemoglobin: 12.4 g/dL (ref 12.0–15.0)
MCH: 29.2 pg (ref 26.0–34.0)
MCHC: 32.9 g/dL (ref 30.0–36.0)
MCV: 88.9 fL (ref 80.0–100.0)
Platelets: 172 K/uL (ref 150–400)
RBC: 4.24 MIL/uL (ref 3.87–5.11)
RDW: 15 % (ref 11.5–15.5)
WBC: 5.4 K/uL (ref 4.0–10.5)
nRBC: 0 % (ref 0.0–0.2)

## 2024-11-12 LAB — BASIC METABOLIC PANEL WITH GFR
Anion gap: 9 (ref 5–15)
BUN: 12 mg/dL (ref 8–23)
CO2: 25 mmol/L (ref 22–32)
Calcium: 9.1 mg/dL (ref 8.9–10.3)
Chloride: 99 mmol/L (ref 98–111)
Creatinine, Ser: 0.58 mg/dL (ref 0.44–1.00)
GFR, Estimated: >60 mL/min
Glucose, Bld: 82 mg/dL (ref 70–99)
Potassium: 4.1 mmol/L (ref 3.5–5.1)
Sodium: 133 mmol/L — ABNORMAL LOW (ref 135–145)

## 2024-11-12 LAB — SURGICAL PCR SCREEN
MRSA, PCR: NEGATIVE
Staphylococcus aureus: POSITIVE — AB

## 2024-11-12 NOTE — Progress Notes (Signed)
 [facility] results routed to Dr. Lequita Halt

## 2024-11-15 NOTE — Progress Notes (Signed)
 " Case: [id number] Date/Time: 11/23/24 1015   Procedure: ARTHROPLASTY, KNEE, TOTAL (Right: Knee)   Anesthesia type: Choice   Pre-op diagnosis: Right knee osteoarthritis   [facility]: [facility] ROOM 10 / [facility]   Surgeons: Melodi Lerner, MD       DISCUSSION: Natalie Hall is a 74 yo female with PMH of HTN, hx of NSTEMI (2017), CAD s/p DES to RCA (2017), PSVT s/p ablation (2017), asthma, GERD, hiatal hernia, arthritis, obesity (BMI 34).  Patient follows with Cardiology for CAD and SVT. Last seen on 01/10/24 by Dr. Mona. She denied any cardiac symptoms. Advised f/u in 1 year. She had cardiac clearance tele visit on 11/04/24. She continued to denied symptoms and is reportedly able to do 4 METS, participating in silver sneakers. Cleared for surgery:  Preoperative Cardiovascular Risk Assessment: According to the Revised Cardiac Risk Index (RCRI), her Perioperative Risk of Major Cardiac Event is (%): 6.6. Her Functional Capacity in METs is: 5.07 according to the Duke Activity Status Index (DASI). The patient was advised that if she develops new symptoms prior to surgery to contact our office to arrange for a follow-up visit, and she verbalized understanding. Per AHA/ACC guidelines, she is deemed acceptable risk for the planned procedure without additional cardiovascular testing.    Ok to hold aspirin  up to 7 days prior to surgery if needed and restart after. -Dr. Mona  VS: BP (!) 145/68   Pulse 68   Temp 36.7 C (Oral)   Resp 16   Ht 5' 3 (1.6 m)   Wt 89.4 kg   LMP  (LMP Unknown)   SpO2 100%   BMI 34.90 kg/m   PROVIDERS: Johnny Garnette LABOR, MD   LABS: Labs reviewed: Acceptable for surgery. (all labs ordered are listed, but only abnormal results are displayed)  Labs Reviewed  SURGICAL PCR SCREEN - Abnormal; Notable for the following components:      Result Value   Staphylococcus aureus POSITIVE (*)    All other components within normal limits  BASIC METABOLIC PANEL WITH GFR - Abnormal; Notable  for the following components:   Sodium 133 (*)    All other components within normal limits  CBC     LHC 03/14/2016:  Prox RCA lesion, 25% stenosed. The left ventricular systolic function is normal. Prox LAD to Mid LAD lesion, 15% stenosed. Mid RCA-1 lesion, 95% stenosed. Mid RCA-2 lesion, 95% stenosed. Post intervention, there is a 0% residual stenosis.   1. Single vessel obstructive CAD 2. Normal LV function 3. Successful stenting of the mid to distal RCA with a DES   Plan: DAPT for one year. Patient may be a candidate for the TWILIGHT trial in which case study protocol will be followed. Anticipate DC in am.  Echo 03/12/2016:  Study Conclusions  - Left ventricle: The cavity size was normal. Karen thickness was   normal. Systolic function was vigorous. The estimated ejection   fraction was in the range of 65% to 70%. Doppler parameters are   consistent with abnormal left ventricular relaxation (grade 1   diastolic dysfunction). Past Medical History:  Diagnosis Date   Anxiety    Asthma    followe by pcp'  uncomplicated,  rescue inhaler prn   Chronic constipation    Chronic low back pain    Coronary artery disease 2017   cardiologist--- dr mona;   NSTEMI  w/ positive NUC 03-13-2016 in setting 8 hrs SVT;  cath 03-14-2016  w/ PCI and DES to mid to distal  RCA,  normal LVEF   Fatty liver    followed by pcp  ;  in epic abd ultrasound 05-15-2023   GERD (gastroesophageal reflux disease)    Hiatal hernia    History of non-ST elevation myocardial infarction (NSTEMI) 03/11/2016   Hx of adenomatous colonic polyps    Hyperlipidemia, mixed    Hypertension    Malignant melanoma of eye, left (HCC) 2017   followed by dr c. 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 Jane MO SURGERY Left 07/05/2003   @[facility]  by dr jane;   peroneal tendon repair   BIOPSY  11/18/2023   Procedure: BLADDER BIOPSY;  Surgeon: Marilynne Rosaline SAILOR, MD;  Location: [facility];  Service: Gynecology;;   BLADDER SUSPENSION N/A 11/18/2023   Procedure: TRANSVAGINAL TAPE (TVT) PROCEDURE--Altis Sling;  Surgeon: Marilynne Rosaline SAILOR, MD;  Location: [facility];  Service: Gynecology;  Laterality: N/A;   CARDIAC CATHETERIZATION N/A 03/14/2016   Procedure: Left Heart Cath and Coronary Angiography;  Surgeon: Peter M Jordan, MD;  Location[facility] INVASIVE CV LAB;  Service: Cardiovascular;  Laterality: N/A;   CARDIAC CATHETERIZATION N/A 03/14/2016   Procedure: Coronary Stent Intervention;  Surgeon: Peter M Jordan, MD;  Location: [facility] INVASIVE CV LAB;  Service: Cardiovascular;  Laterality: N/A;   CARPAL TUNNEL RELEASE Left 04/01/2001   @ [facility] by dr sypher;   And left thumb pulley release   CATARACT EXTRACTION W/ INTRAOCULAR LENS IMPLANT Bilateral 2014   COLONOSCOPY  08/14/2021   per Dr. Teressa, precancerous polyps, repeat in 7 yrs   COLONOSCOPY WITH PROPOFOL   08/14/2021   by dr d. teressa   COLPOCLEISIS N/A 11/18/2023   Procedure: COLPOCLEISIS;  Surgeon: Marilynne Rosaline SAILOR, MD;  Location: [facility];  Service: Gynecology;  Laterality: N/A;   CYSTOSCOPY N/A 11/18/2023   Procedure: CYSTOSCOPY;  Surgeon: Marilynne Rosaline SAILOR, MD;  Location: [facility];  Service: Gynecology;  Laterality: N/A;   ELECTROPHYSIOLOGIC STUDY N/A  05/21/2016   Procedure: SVT Ablation;  Surgeon: Will Gladis Norton, MD;  Location: [facility] INVASIVE CV LAB;  Service: Cardiovascular;  Laterality: N/A;   EXCISION HAGLUND'S DEFORMITY WITH ACHILLES TENDON REPAIR Left 07/02/2019   Procedure: Left Achilles tendon debridement and reconstruction and excision of Haglund deformity;  Surgeon: Kit Rush, MD;  Location: [facility];  Service: Orthopedics;  Laterality: Left;   GASTROCNEMIUS RECESSION Left 07/02/2019   Procedure: Gastroc recession;  Surgeon: Kit Rush, MD;  Location: [facility];  Service: Orthopedics;  Laterality: Left;   IR KYPHO THORACIC WITH BONE BIOPSY  12/18/2018   IR RADIOLOGIST EVAL & MGMT  12/09/2018   IR RADIOLOGIST EVAL & MGMT  04/30/2019   KNEE CLOSED REDUCTION Left 02/03/2014   Procedure: CLOSED MANIPULATION LEFT KNEE;  Surgeon: Dempsey LULLA Moan,  MD;  [facility];  Service: Orthopedics;  Laterality: Left;   LAPAROSCOPIC CHOLECYSTECTOMY  1996   RADIOACTIVE PLAQUE INSERTION Left 09/17/2016   @ [facility] by dr c. cheree;   I-125 plaque application to left eye   RADIOACTIVE PLAQUE REMOVAL Left 09/21/2016   @[facility] by dr c. cheree;   left eye   RECTOCELE REPAIR N/A 11/18/2023   Procedure: LEVATOR PLICATION AND PERINEORRHAPHY;  Surgeon: Marilynne Rosaline SAILOR, MD;  Location: [facility];  Service: Gynecology;  Laterality: N/A;   REVERSE SHOULDER ARTHROPLASTY Left 06/28/2017   Procedure: LEFT SHOULDER REVERSE SHOULDER ARTHROPLASTY;  Surgeon: Kay Kemps, MD;  Location: [facility];  Service: Orthopedics;  Laterality: Left;   REVERSE SHOULDER ARTHROPLASTY Right 11/03/2021   Procedure: REVERSE SHOULDER ARTHROPLASTY;  Surgeon: Kay Kemps, MD;  [facility];  Service: Orthopedics;  Laterality: Right;  with ISB   TOTAL KNEE ARTHROPLASTY Left 12/21/2013   Procedure: LEFT TOTAL KNEE ARTHROPLASTY;  Surgeon: Dempsey LULLA Moan, MD;  [facility];  Service: Orthopedics;  Laterality:  Left;   VARICOSE VEIN SURGERY Bilateral    laser ablation    MEDICATIONS:  acetaminophen  (TYLENOL ) 500 MG tablet   albuterol  (VENTOLIN  HFA) 108 (90 Base) MCG/ACT inhaler   alendronate  (FOSAMAX ) 35 MG tablet   aspirin  EC 81 MG tablet   Calcium  Carb-Cholecalciferol  600-800 MG-UNIT TABS   cetirizine (ZYRTEC) 10 MG tablet   Cholecalciferol  (VITAMIN D ) 50 MCG (2000 UT) CAPS   estradiol  (ESTRACE ) 0.1 MG/GM vaginal cream   ezetimibe  (ZETIA ) 10 MG tablet   Magnesium 250 MG TABS   metFORMIN  (GLUCOPHAGE ) 500 MG tablet   metoprolol  succinate (TOPROL -XL) 25 MG 24 hr tablet   Multiple Vitamin (MULTIVITAMIN WITH MINERALS) TABS tablet   MYRBETRIQ  50 MG TB24 tablet   omeprazole  (PRILOSEC) 40 MG capsule   Polyethyl Glycol-Propyl Glycol (SYSTANE ULTRA) 0.4-0.3 % SOLN   ramipril  (ALTACE ) 10 MG capsule   rosuvastatin  (CRESTOR ) 40 MG tablet   temazepam  (RESTORIL ) 15 MG capsule   No current facility-administered medications for this encounter.    Burnard CHRISTELLA Odis DEVONNA [facility] Short Stay/Anesthesiology [facility] Phone [phone] 11/15/2024 12:59 PM       "

## 2024-11-15 NOTE — Anesthesia Preprocedure Evaluation (Addendum)
"                                    Anesthesia Evaluation  Patient identified by MRN, date of birth, ID band Patient awake    Reviewed: Allergy & Precautions, NPO status , Patient's Chart, lab work & pertinent test results, reviewed documented beta blocker date and time   History of Anesthesia Complications Negative for: history of anesthetic complications  Airway Mallampati: III  TM Distance: >3 FB     Dental no notable dental hx.    Pulmonary asthma , neg COPD   breath sounds clear to auscultation       Cardiovascular hypertension, + CAD and + Past MI  (-) Cardiac Stents and (-) CABG  Rhythm:Regular Rate:Normal     Neuro/Psych neg Seizures PSYCHIATRIC DISORDERS Anxiety Depression     Neuromuscular disease    GI/Hepatic hiatal hernia,GERD  ,,(+) neg Cirrhosis        Endo/Other    Renal/GU Renal disease     Musculoskeletal  (+) Arthritis , Osteoarthritis,    Abdominal   Peds  Hematology  (+) Blood dyscrasia, anemia   Anesthesia Other Findings   Reproductive/Obstetrics                              Anesthesia Physical Anesthesia Plan  ASA: 2  Anesthesia Plan: MAC and Spinal   Post-op Pain Management: Regional block*   Induction: Intravenous  PONV Risk Score and Plan: 2 and Ondansetron  and Propofol  infusion  Airway Management Planned:   Additional Equipment:   Intra-op Plan:   Post-operative Plan:   Informed Consent: I have reviewed the patients History and Physical, chart, labs and discussed the procedure including the risks, benefits and alternatives for the proposed anesthesia with the patient or authorized representative who has indicated his/her understanding and acceptance.     Dental advisory given  Plan Discussed with: CRNA  Anesthesia Plan Comments: (See PAT note from 1/22)         Anesthesia Quick Evaluation  "

## 2024-11-23 ENCOUNTER — Other Ambulatory Visit: Payer: Self-pay

## 2024-11-23 ENCOUNTER — Observation Stay (HOSPITAL_COMMUNITY)
Admission: RE | Admit: 2024-11-23 | Discharge: 2024-11-24 | Disposition: A | Source: Ambulatory Visit | Attending: Orthopedic Surgery | Admitting: Orthopedic Surgery

## 2024-11-23 ENCOUNTER — Encounter (HOSPITAL_COMMUNITY): Payer: Self-pay | Admitting: Orthopedic Surgery

## 2024-11-23 ENCOUNTER — Other Ambulatory Visit (HOSPITAL_COMMUNITY): Payer: Self-pay

## 2024-11-23 ENCOUNTER — Ambulatory Visit (HOSPITAL_COMMUNITY): Payer: Self-pay | Admitting: Medical

## 2024-11-23 ENCOUNTER — Encounter (HOSPITAL_COMMUNITY): Admission: RE | Payer: Self-pay | Source: Ambulatory Visit

## 2024-11-23 ENCOUNTER — Ambulatory Visit (HOSPITAL_COMMUNITY): Admitting: Certified Registered Nurse Anesthetist

## 2024-11-23 DIAGNOSIS — Z96652 Presence of left artificial knee joint: Secondary | ICD-10-CM | POA: Insufficient documentation

## 2024-11-23 DIAGNOSIS — M1711 Unilateral primary osteoarthritis, right knee: Principal | ICD-10-CM | POA: Diagnosis present

## 2024-11-23 DIAGNOSIS — Z7982 Long term (current) use of aspirin: Secondary | ICD-10-CM | POA: Insufficient documentation

## 2024-11-23 DIAGNOSIS — I10 Essential (primary) hypertension: Secondary | ICD-10-CM | POA: Insufficient documentation

## 2024-11-23 DIAGNOSIS — Z96611 Presence of right artificial shoulder joint: Secondary | ICD-10-CM | POA: Insufficient documentation

## 2024-11-23 DIAGNOSIS — I2510 Atherosclerotic heart disease of native coronary artery without angina pectoris: Secondary | ICD-10-CM | POA: Insufficient documentation

## 2024-11-23 DIAGNOSIS — Z79899 Other long term (current) drug therapy: Secondary | ICD-10-CM | POA: Insufficient documentation

## 2024-11-23 DIAGNOSIS — Z96612 Presence of left artificial shoulder joint: Secondary | ICD-10-CM | POA: Insufficient documentation

## 2024-11-23 DIAGNOSIS — J45909 Unspecified asthma, uncomplicated: Secondary | ICD-10-CM | POA: Insufficient documentation

## 2024-11-23 DIAGNOSIS — I252 Old myocardial infarction: Secondary | ICD-10-CM | POA: Insufficient documentation

## 2024-11-23 DIAGNOSIS — Z85840 Personal history of malignant neoplasm of eye: Secondary | ICD-10-CM | POA: Insufficient documentation

## 2024-11-23 DIAGNOSIS — M179 Osteoarthritis of knee, unspecified: Principal | ICD-10-CM | POA: Diagnosis present

## 2024-11-23 MED ORDER — SODIUM CHLORIDE 0.9 % IV SOLN: INTRAVENOUS | Status: DC

## 2024-11-23 MED ORDER — MEPIVACAINE HCL (PF) 2 % IJ SOLN
INTRAMUSCULAR | Status: DC | PRN
  Administered 2024-11-23: 60 mg via EPIDURAL

## 2024-11-23 MED ORDER — ASPIRIN 81 MG PO CHEW
81.0000 mg | CHEWABLE_TABLET | Freq: Two times a day (BID) | ORAL | Status: DC
  Administered 2024-11-24: 81 mg via ORAL
  Filled 2024-11-23: qty 1

## 2024-11-23 MED ORDER — TRAMADOL HCL 50 MG PO TABS
50.0000 mg | ORAL_TABLET | Freq: Four times a day (QID) | ORAL | Status: DC | PRN
  Administered 2024-11-23 – 2024-11-24 (×3): 50 mg via ORAL
  Filled 2024-11-23 (×3): qty 1

## 2024-11-23 MED ORDER — PANTOPRAZOLE SODIUM 40 MG PO TBEC
80.0000 mg | DELAYED_RELEASE_TABLET | Freq: Every day | ORAL | Status: DC
  Administered 2024-11-24: 80 mg via ORAL
  Filled 2024-11-23: qty 2

## 2024-11-23 MED ORDER — ACETAMINOPHEN 325 MG PO TABS: 325.0000 mg | ORAL_TABLET | Freq: Four times a day (QID) | ORAL | Status: DC | PRN

## 2024-11-23 MED ORDER — STERILE WATER FOR IRRIGATION IR SOLN
Status: DC | PRN
  Administered 2024-11-23: 2000 mL

## 2024-11-23 MED ORDER — ONDANSETRON HCL 4 MG/2ML IJ SOLN: 4.0000 mg | Freq: Four times a day (QID) | INTRAMUSCULAR | Status: DC | PRN

## 2024-11-23 MED ORDER — PROPOFOL 500 MG/50ML IV EMUL
INTRAVENOUS | Status: DC | PRN
  Administered 2024-11-23: 50 ug/kg/min via INTRAVENOUS

## 2024-11-23 MED ORDER — CHLORHEXIDINE GLUCONATE 0.12 % MT SOLN
15.0000 mL | Freq: Once | OROMUCOSAL | Status: AC
  Administered 2024-11-23: 15 mL via OROMUCOSAL

## 2024-11-23 MED ORDER — METHOCARBAMOL 500 MG PO TABS
500.0000 mg | ORAL_TABLET | Freq: Four times a day (QID) | ORAL | Status: DC | PRN
  Administered 2024-11-23 – 2024-11-24 (×3): 500 mg via ORAL
  Filled 2024-11-23 (×3): qty 1

## 2024-11-23 MED ORDER — ONDANSETRON HCL 4 MG PO TABS: 4.0000 mg | ORAL_TABLET | Freq: Four times a day (QID) | ORAL | Status: DC | PRN

## 2024-11-23 MED ORDER — MENTHOL 3 MG MT LOZG: 1.0000 | LOZENGE | OROMUCOSAL | Status: DC | PRN

## 2024-11-23 MED ORDER — ONDANSETRON HCL 4 MG/2ML IJ SOLN
INTRAMUSCULAR | Status: DC | PRN
  Administered 2024-11-23: 4 mg via INTRAVENOUS

## 2024-11-23 MED ORDER — POVIDONE-IODINE 10 % EX SWAB: 2.0000 | Freq: Once | CUTANEOUS | Status: DC

## 2024-11-23 MED ORDER — METOCLOPRAMIDE HCL 5 MG PO TABS: 5.0000 mg | ORAL_TABLET | Freq: Three times a day (TID) | ORAL | Status: DC | PRN

## 2024-11-23 MED ORDER — DEXAMETHASONE SOD PHOSPHATE PF 10 MG/ML IJ SOLN
10.0000 mg | Freq: Once | INTRAMUSCULAR | Status: AC
  Administered 2024-11-24: 10 mg via INTRAVENOUS
  Filled 2024-11-23: qty 1

## 2024-11-23 MED ORDER — HYDROMORPHONE HCL 1 MG/ML IJ SOLN: 0.5000 mg | INTRAMUSCULAR | Status: DC | PRN

## 2024-11-23 MED ORDER — TRANEXAMIC ACID-NACL 1000-0.7 MG/100ML-% IV SOLN
1000.0000 mg | INTRAVENOUS | Status: AC
  Administered 2024-11-23: 1000 mg via INTRAVENOUS
  Filled 2024-11-23: qty 100

## 2024-11-23 MED ORDER — FENTANYL CITRATE (PF) 50 MCG/ML IJ SOSY: 25.0000 ug | PREFILLED_SYRINGE | INTRAMUSCULAR | Status: DC | PRN

## 2024-11-23 MED ORDER — FLEET ENEMA RE ENEM: 1.0000 | ENEMA | Freq: Once | RECTAL | Status: DC | PRN

## 2024-11-23 MED ORDER — SODIUM CHLORIDE (PF) 0.9 % IJ SOLN
INTRAMUSCULAR | Status: DC | PRN
  Administered 2024-11-23: 80 mL

## 2024-11-23 MED ORDER — MUPIROCIN 2 % EX OINT
1.0000 | TOPICAL_OINTMENT | Freq: Two times a day (BID) | CUTANEOUS | 0 refills | Status: AC
  Filled 2024-11-23: qty 44, 22d supply, fill #0

## 2024-11-23 MED ORDER — SODIUM CHLORIDE (PF) 0.9 % IJ SOLN
INTRAMUSCULAR | Status: AC
  Filled 2024-11-23: qty 10

## 2024-11-23 MED ORDER — CEFAZOLIN SODIUM-DEXTROSE 2-4 GM/100ML-% IV SOLN
2.0000 g | INTRAVENOUS | Status: AC
  Administered 2024-11-23: 2 g via INTRAVENOUS
  Filled 2024-11-23: qty 100

## 2024-11-23 MED ORDER — MIDAZOLAM HCL (PF) 2 MG/2ML IJ SOLN: 1.0000 mg | Freq: Once | INTRAMUSCULAR | Status: DC

## 2024-11-23 MED ORDER — ACETAMINOPHEN 10 MG/ML IV SOLN
1000.0000 mg | Freq: Four times a day (QID) | INTRAVENOUS | Status: DC
  Administered 2024-11-23: 1000 mg via INTRAVENOUS
  Filled 2024-11-23: qty 100

## 2024-11-23 MED ORDER — EZETIMIBE 10 MG PO TABS
10.0000 mg | ORAL_TABLET | Freq: Every day | ORAL | Status: DC
  Administered 2024-11-24: 10 mg via ORAL
  Filled 2024-11-23: qty 1

## 2024-11-23 MED ORDER — CHLORHEXIDINE GLUCONATE 4 % EX SOLN
1.0000 | CUTANEOUS | 1 refills | Status: AC
  Filled 2024-11-23: qty 946, 30d supply, fill #0

## 2024-11-23 MED ORDER — DIPHENHYDRAMINE HCL 12.5 MG/5ML PO ELIX: 12.5000 mg | ORAL_SOLUTION | ORAL | Status: DC | PRN

## 2024-11-23 MED ORDER — OXYCODONE HCL 5 MG PO TABS: 5.0000 mg | ORAL_TABLET | Freq: Once | ORAL | Status: DC | PRN

## 2024-11-23 MED ORDER — SODIUM CHLORIDE (PF) 0.9 % IJ SOLN
INTRAMUSCULAR | Status: AC
  Filled 2024-11-23: qty 50

## 2024-11-23 MED ORDER — MIRABEGRON ER 25 MG PO TB24
50.0000 mg | ORAL_TABLET | Freq: Every day | ORAL | Status: DC
  Administered 2024-11-24: 50 mg via ORAL
  Filled 2024-11-23: qty 2

## 2024-11-23 MED ORDER — FENTANYL CITRATE (PF) 50 MCG/ML IJ SOSY
50.0000 ug | PREFILLED_SYRINGE | Freq: Once | INTRAMUSCULAR | Status: AC
  Administered 2024-11-23: 50 ug via INTRAVENOUS
  Filled 2024-11-23: qty 2

## 2024-11-23 MED ORDER — BUPIVACAINE LIPOSOME 1.3 % IJ SUSP
INTRAMUSCULAR | Status: AC
  Filled 2024-11-23: qty 20

## 2024-11-23 MED ORDER — PHENYLEPHRINE 80 MCG/ML (10ML) SYRINGE FOR IV PUSH (FOR BLOOD PRESSURE SUPPORT)
PREFILLED_SYRINGE | INTRAVENOUS | Status: DC | PRN
  Administered 2024-11-23 (×3): 80 ug via INTRAVENOUS

## 2024-11-23 MED ORDER — DEXAMETHASONE SOD PHOSPHATE PF 10 MG/ML IJ SOLN
INTRAMUSCULAR | Status: DC | PRN
  Administered 2024-11-23: 10 mg via PERINEURAL

## 2024-11-23 MED ORDER — CEFAZOLIN SODIUM-DEXTROSE 2-4 GM/100ML-% IV SOLN
2.0000 g | Freq: Four times a day (QID) | INTRAVENOUS | Status: AC
  Administered 2024-11-23 (×2): 2 g via INTRAVENOUS
  Filled 2024-11-23 (×2): qty 100

## 2024-11-23 MED ORDER — 0.9 % SODIUM CHLORIDE (POUR BTL) OPTIME
TOPICAL | Status: DC | PRN
  Administered 2024-11-23: 1000 mL

## 2024-11-23 MED ORDER — BUPIVACAINE LIPOSOME 1.3 % IJ SUSP: 20.0000 mL | Freq: Once | INTRAMUSCULAR | Status: DC

## 2024-11-23 MED ORDER — ACETAMINOPHEN 500 MG PO TABS
1000.0000 mg | ORAL_TABLET | Freq: Four times a day (QID) | ORAL | Status: AC
  Administered 2024-11-23 – 2024-11-24 (×4): 1000 mg via ORAL
  Filled 2024-11-23 (×4): qty 2

## 2024-11-23 MED ORDER — POLYETHYLENE GLYCOL 3350 17 G PO PACK: 17.0000 g | PACK | Freq: Every day | ORAL | Status: DC | PRN

## 2024-11-23 MED ORDER — OXYCODONE HCL 5 MG PO TABS
5.0000 mg | ORAL_TABLET | ORAL | Status: DC | PRN
  Administered 2024-11-23: 10 mg via ORAL
  Administered 2024-11-23: 5 mg via ORAL
  Administered 2024-11-24 (×3): 10 mg via ORAL
  Filled 2024-11-23: qty 1
  Filled 2024-11-23 (×4): qty 2

## 2024-11-23 MED ORDER — SODIUM CHLORIDE 0.9 % IR SOLN
Status: DC | PRN
  Administered 2024-11-23: 1000 mL

## 2024-11-23 MED ORDER — ORAL CARE MOUTH RINSE: 15.0000 mL | Freq: Once | OROMUCOSAL | Status: AC

## 2024-11-23 MED ORDER — METHOCARBAMOL 1000 MG/10ML IJ SOLN: 500.0000 mg | Freq: Four times a day (QID) | INTRAMUSCULAR | Status: DC | PRN

## 2024-11-23 MED ORDER — ACETAMINOPHEN 10 MG/ML IV SOLN: 1000.0000 mg | Freq: Once | INTRAVENOUS | Status: DC | PRN

## 2024-11-23 MED ORDER — LACTATED RINGERS IV SOLN: INTRAVENOUS | Status: DC

## 2024-11-23 MED ORDER — ROSUVASTATIN CALCIUM 20 MG PO TABS
40.0000 mg | ORAL_TABLET | Freq: Every day | ORAL | Status: DC
  Administered 2024-11-24: 40 mg via ORAL
  Filled 2024-11-23: qty 2

## 2024-11-23 MED ORDER — LORATADINE 10 MG PO TABS
10.0000 mg | ORAL_TABLET | Freq: Every day | ORAL | Status: DC
  Administered 2024-11-24: 10 mg via ORAL
  Filled 2024-11-23: qty 1

## 2024-11-23 MED ORDER — PHENOL 1.4 % MT LIQD: 1.0000 | OROMUCOSAL | Status: DC | PRN

## 2024-11-23 MED ORDER — ROPIVACAINE HCL 5 MG/ML IJ SOLN
INTRAMUSCULAR | Status: DC | PRN
  Administered 2024-11-23: 20 mL via PERINEURAL

## 2024-11-23 MED ORDER — PROPOFOL 10 MG/ML IV BOLUS
INTRAVENOUS | Status: DC | PRN
  Administered 2024-11-23: 20 mg via INTRAVENOUS

## 2024-11-23 MED ORDER — METOCLOPRAMIDE HCL 5 MG/ML IJ SOLN: 5.0000 mg | Freq: Three times a day (TID) | INTRAMUSCULAR | Status: DC | PRN

## 2024-11-23 MED ORDER — OXYCODONE HCL 5 MG/5ML PO SOLN: 5.0000 mg | Freq: Once | ORAL | Status: DC | PRN

## 2024-11-23 MED ORDER — LIDOCAINE 2% (20 MG/ML) 5 ML SYRINGE
INTRAMUSCULAR | Status: DC | PRN
  Administered 2024-11-23: 40 mg via INTRAVENOUS

## 2024-11-23 MED ORDER — METOPROLOL SUCCINATE ER 25 MG PO TB24
25.0000 mg | ORAL_TABLET | Freq: Every day | ORAL | Status: DC
  Administered 2024-11-24: 25 mg via ORAL
  Filled 2024-11-23: qty 1

## 2024-11-23 MED ORDER — ALBUTEROL SULFATE (2.5 MG/3ML) 0.083% IN NEBU: 2.5000 mg | INHALATION_SOLUTION | RESPIRATORY_TRACT | Status: DC | PRN

## 2024-11-23 MED ORDER — DEXAMETHASONE SOD PHOSPHATE PF 10 MG/ML IJ SOLN: 8.0000 mg | Freq: Once | INTRAMUSCULAR | Status: DC

## 2024-11-23 MED ORDER — BISACODYL 10 MG RE SUPP: 10.0000 mg | Freq: Every day | RECTAL | Status: DC | PRN

## 2024-11-23 MED ORDER — ONDANSETRON HCL 4 MG/2ML IJ SOLN: 4.0000 mg | Freq: Once | INTRAMUSCULAR | Status: DC | PRN

## 2024-11-23 NOTE — Evaluation (Signed)
 Physical Therapy Evaluation Patient Details Name: FLAVIA BRUSS MRN: [id number] DOB: Nov 07, 1950 Today's Date: 11/23/2024  History of Present Illness  74 yo F s/p R TKA opn 11/23/24.  PMH includes: DM, CAD, CVA, Liver disease, COPD. s/p R TSA Reverse, L r TSA, CAD, NSTEMI, depression, HTN, PST, HH, ankle surgery, excision haglund's defomity, closed manipulation L TKA  Clinical Impression  Pt is s/p TKA resulting in the deficits listed below (see PT Problem List).   Pt reluctantly agreeable to working with PT. Pt amb ~ 10' with RW and min assist, distance limited by pain.  Pt was medicated prior to PT.    Pt will benefit from acute skilled PT to increase their independence and safety with mobility to allow discharge.          If plan is discharge home, recommend the following: A little help with walking and/or transfers;A little help with bathing/dressing/bathroom;Help with stairs or ramp for entrance;Assist for transportation;Assistance with cooking/housework   Can travel by private Tax Inspector (2 wheels)  Recommendations for Other Services       Functional Status Assessment Patient has had a recent decline in their functional status and demonstrates the ability to make significant improvements in function in a reasonable and predictable amount of time.     Precautions / Restrictions Precautions Precautions: Fall;Knee Restrictions Weight Bearing Restrictions Per Provider Order: No Other Position/Activity Restrictions: WBAT      Mobility  Bed Mobility Overal bed mobility: Needs Assistance Bed Mobility: Supine to Sit, Sit to Supine     Supine to sit: Contact guard     General bed mobility comments: for safety    Transfers Overall transfer level: Needs assistance Equipment used: Rolling walker (2 wheels) Transfers: Sit to/from Stand Sit to Stand: Contact guard assist, Min assist           General transfer comment: cues  for hand placement and RLE position    Ambulation/Gait Ambulation/Gait assistance: Contact guard assist, Min assist Gait Distance (Feet): 10 Feet Assistive device: Rolling walker (2 wheels) Gait Pattern/deviations: Step-to pattern, Decreased stance time - right, Decreased weight shift to right       General Gait Details: cues for sequence, RW position  Stairs            Wheelchair Mobility     Tilt Bed    Modified Rankin (Stroke Patients Only)       Balance Overall balance assessment: Needs assistance Sitting-balance support: Feet supported, No upper extremity supported Sitting balance-Leahy Scale: Fair Sitting balance - Comments: not challenged   Standing balance support: During functional activity, Reliant on assistive device for balance Standing balance-Leahy Scale: Poor                               Pertinent Vitals/Pain Pain Assessment Pain Assessment: 0-10 Pain Score: 8  Pain Location: right knee Pain Descriptors / Indicators: Sore, Aching, Grimacing, Guarding Pain Intervention(s): Limited activity within patient's tolerance, Monitored during session, Premedicated before session    Home Living Family/patient expects to be discharged to:: Private residence Living Arrangements: Spouse/significant other;Children Available Help at Discharge: Family Type of Home: House Home Access: Stairs to enter Entrance Stairs-Rails: Right;Left;Can reach both Secretary/administrator of Steps: 2   Home Layout: One level Home Equipment: BSC/3in1;Shower seat      Prior Function Prior Level of Function : Independent/Modified Independent  Mobility Comments: ind ADLs Comments: ind     Extremity/Trunk Assessment   Upper Extremity Assessment Upper Extremity Assessment: Overall WFL for tasks assessed    Lower Extremity Assessment Lower Extremity Assessment: RLE deficits/detail RLE Deficits / Details: ankle WFL, knee extension/hip  flexion 3/5, limited by anticipated post op deficits       Communication   Communication Communication: No apparent difficulties    Cognition Arousal: Alert Behavior During Therapy: WFL for tasks assessed/performed   PT - Cognitive impairments: No apparent impairments                         Following commands: Intact       Cueing Cueing Techniques: Verbal cues     General Comments      Exercises     Assessment/Plan    PT Assessment Patient needs continued PT services  PT Problem List Decreased strength;Decreased range of motion;Decreased activity tolerance;Decreased balance;Decreased knowledge of use of DME;Pain;Decreased mobility       PT Treatment Interventions DME instruction;Therapeutic exercise;Gait training;Functional mobility training;Therapeutic activities;Patient/family education;Stair training    PT Goals (Current goals can be found in the Care Plan section)  Acute Rehab PT Goals Patient Stated Goal: less pain PT Goal Formulation: With patient Time For Goal Achievement: 11/30/24 Potential to Achieve Goals: Good    Frequency 7X/week     Co-evaluation               AM-PAC PT 6 Clicks Mobility  Outcome Measure Help needed turning from your back to your side while in a flat bed without using bedrails?: A Little Help needed moving from lying on your back to sitting on the side of a flat bed without using bedrails?: A Little Help needed moving to and from a bed to a chair (including a wheelchair)?: A Little Help needed standing up from a chair using your arms (e.g., wheelchair or bedside chair)?: A Little Help needed to walk in hospital room?: A Little Help needed climbing 3-5 steps with a railing? : A Lot 6 Click Score: 17    End of Session Equipment Utilized During Treatment: Gait belt Activity Tolerance: Patient tolerated treatment well Patient left: with call bell/phone within reach;in bed;with bed alarm set;with family/visitor  present Nurse Communication: Mobility status PT Visit Diagnosis: Other abnormalities of gait and mobility (R26.89)    Time: 8387-8366 PT Time Calculation (min) (ACUTE ONLY): 21 min   Charges:   PT Evaluation $PT Eval Low Complexity: 1 Low   PT General Charges $$ ACUTE PT VISIT: 1 Visit         Evia Goldsmith, PT  Acute Rehab Dept [facility]) [phone]  11/23/2024   [facility] 11/23/2024, 4:43 PM

## 2024-11-23 NOTE — Interval H&P Note (Signed)
 History and Physical Interval Note:  11/23/2024 8:24 AM  Natalie Hall  has presented today for surgery, with the diagnosis of Right knee osteoarthritis.  The various methods of treatment have been discussed with the patient and family. After consideration of risks, benefits and other options for treatment, the patient has consented to  Procedures: ARTHROPLASTY, KNEE, TOTAL (Right) as a surgical intervention.  The patient's history has been reviewed, patient examined, no change in status, stable for surgery.  I have reviewed the patient's chart and labs.  Questions were answered to the patient's satisfaction.     Dempsey Yuritza Paulhus

## 2024-11-23 NOTE — Anesthesia Postprocedure Evaluation (Signed)
"   Anesthesia Post Note  Patient: Natalie Hall  Procedure(s) Performed: ARTHROPLASTY, KNEE, TOTAL (Right: Knee)     Patient location during evaluation: PACU Anesthesia Type: MAC and Spinal Level of consciousness: awake and alert Pain management: pain level controlled Vital Signs Assessment: post-procedure vital signs reviewed and stable Respiratory status: spontaneous breathing, nonlabored ventilation, respiratory function stable and patient connected to nasal cannula oxygen Cardiovascular status: blood pressure returned to baseline and stable Postop Assessment: no apparent nausea or vomiting Anesthetic complications: no   No notable events documented.  Last Vitals:  Vitals:   11/23/24 1315 11/23/24 1330  BP: 135/61 137/71  Pulse: 60 (!) 59  Resp: 20 14  Temp:    SpO2: 100% 100%    Last Pain:  Vitals:   11/23/24 1330  TempSrc:   PainSc: 0-No pain    LLE Motor Response: Purposeful movement (11/23/24 1330) LLE Sensation: Decreased;Numbness (11/23/24 1330) RLE Motor Response: Purposeful movement (11/23/24 1330) RLE Sensation: Decreased;Numbness (11/23/24 1330) L Sensory Level: L3-Anterior knee, lower leg (11/23/24 1330) R Sensory Level: L3-Anterior knee, lower leg (11/23/24 1330)  Natalie Hall      "

## 2024-11-23 NOTE — Discharge Instructions (Addendum)
 Natalie Moan, MD Total Joint Specialist [facility] [place] [address] [place], [place] [address] [phone]  TOTAL KNEE REPLACEMENT POSTOPERATIVE DIRECTIONS    Knee Rehabilitation, Guidelines Following Surgery  Results after knee surgery are often greatly improved when you follow the exercise, range of motion and muscle strengthening exercises prescribed by your doctor. Safety measures are also important to protect the knee from further injury. If any of these exercises cause you to have increased pain or swelling in your knee joint, decrease the amount until you are comfortable again and slowly increase them. If you have problems or questions, call your caregiver or physical therapist for advice.   BLOOD CLOT PREVENTION Take 81 mg Aspirin  two times a day for three weeks following surgery. Then resume taking 81mg  Aspirin  once a day. You may resume your vitamins/supplements upon discharge from the hospital. Do not take any NSAIDs (Advil, Aleve, Ibuprofen, Meloxicam, etc.) for 3 weeks, while taking 81mg  Aspirin  twice a day.  HOME CARE INSTRUCTIONS  Remove items at home which could result in a fall. This includes throw rugs or furniture in walking pathways.  ICE to the affected knee as much as tolerated. Icing helps control swelling. If the swelling is well controlled you will be more comfortable and rehab easier. Continue to use ice on the knee for pain and swelling from surgery. You may notice swelling that will progress down to the foot and ankle. This is normal after surgery. Elevate the leg when you are not up walking on it.    Continue to use the breathing machine which will help keep your temperature down. It is common for your temperature to cycle up and down following surgery, especially at night when you are not up moving around and exerting yourself. The breathing machine keeps your lungs expanded and your temperature down. Do not place pillow under the  operative knee, focus on keeping the knee straight while resting  DIET You may resume your previous home diet once you are discharged from the hospital.  DRESSING / WOUND CARE / SHOWERING Keep your bulky bandage on for 2 days. On the third post-operative day you may remove the Ace bandage and gauze. There is a waterproof adhesive bandage on your skin which will stay in place until your first follow-up appointment. Once you remove this you will not need to place another bandage You may begin showering 3 days following surgery, but do not submerge the incision under water .  ACTIVITY For the first 5 days, the key is rest and control of pain and swelling Do your home exercises twice a day starting on post-operative day 3. On the days you go to physical therapy, just do the home exercises once that day. You should rest, ice and elevate the leg for 50 minutes out of every hour. Get up and walk/stretch for 10 minutes per hour. After 5 days you can increase your activity slowly as tolerated. Walk with your walker as instructed. Use the walker until you are comfortable transitioning to a cane. Walk with the cane in the opposite hand of the operative leg. You may discontinue the cane once you are comfortable and walking steadily. Avoid periods of inactivity such as sitting longer than an hour when not asleep. This helps prevent blood clots.  You may discontinue the knee immobilizer once you are able to perform a straight leg raise while lying down. You may resume a sexual relationship in one month or when given the OK by  your doctor.  You may return to work once you are cleared by your doctor.  Do not drive a car for 6 weeks or until released by your surgeon.  Do not drive while taking narcotics.  TED HOSE STOCKINGS Wear the elastic stockings on both legs for three weeks following surgery during the day. You may remove them at night for sleeping.  WEIGHT BEARING Weight bearing as tolerated with assist  device (walker, cane, etc) as directed, use it as long as suggested by your surgeon or therapist, typically at least 4-6 weeks.  POSTOPERATIVE CONSTIPATION PROTOCOL Constipation - defined medically as fewer than three stools per week and severe constipation as less than one stool per week.  One of the most common issues patients have following surgery is constipation.  Even if you have a regular bowel pattern at home, your normal regimen is likely to be disrupted due to multiple reasons following surgery.  Combination of anesthesia, postoperative narcotics, change in appetite and fluid intake all can affect your bowels.  In order to avoid complications following surgery, here are some recommendations in order to help you during your recovery period.  Colace (docusate) - Pick up an over-the-counter form of Colace or another stool softener and take twice a day as long as you are requiring postoperative pain medications.  Take with a full glass of water  daily.  If you experience loose stools or diarrhea, hold the colace until you stool forms back up. If your symptoms do not get better within 1 week or if they get worse, check with your doctor. Dulcolax (bisacodyl ) - Pick up over-the-counter and take as directed by the product packaging as needed to assist with the movement of your bowels.  Take with a full glass of water .  Use this product as needed if not relieved by Colace only.  MiraLax  (polyethylene glycol) - Pick up over-the-counter to have on hand. MiraLax  is a solution that will increase the amount of water  in your bowels to assist with bowel movements.  Take as directed and can mix with a glass of water , juice, soda, coffee, or tea. Take if you go more than two days without a movement. Do not use MiraLax  more than once per day. Call your doctor if you are still constipated or irregular after using this medication for 7 days in a row.  If you continue to have problems with postoperative constipation,  please contact the office for further assistance and recommendations.  If you experience the worst abdominal pain ever or develop nausea or vomiting, please contact the office immediatly for further recommendations for treatment.  ITCHING If you experience itching with your medications, try taking only a single pain pill, or even half a pain pill at a time.  You can also use Benadryl  over the counter for itching or also to help with sleep.   MEDICATIONS See your medication summary on the "After Visit Summary" that the nursing staff will review with you prior to discharge.  You may have some home medications which will be placed on hold until you complete the course of blood thinner medication.  It is important for you to complete the blood thinner medication as prescribed by your surgeon.  Continue your approved medications as instructed at time of discharge.  PRECAUTIONS If you experience chest pain or shortness of breath - call 911 immediately for transfer to the hospital emergency department.  If you develop a fever greater that 101 F, purulent drainage from wound, increased redness  or drainage from wound, foul odor from the wound/dressing, or calf pain - CONTACT YOUR SURGEON.                                                   FOLLOW-UP APPOINTMENTS Make sure you keep all of your appointments after your operation with your surgeon and caregivers. You should call the office at the above phone number and make an appointment for approximately two weeks after the date of your surgery or on the date instructed by your surgeon outlined in the After Visit Summary.  RANGE OF MOTION AND STRENGTHENING EXERCISES  Rehabilitation of the knee is important following a knee injury or an operation. After just a few days of immobilization, the muscles of the thigh which control the knee become weakened and shrink (atrophy). Knee exercises are designed to build up the tone and strength of the thigh muscles and to  improve knee motion. Often times heat used for twenty to thirty minutes before working out will loosen up your tissues and help with improving the range of motion but do not use heat for the first two weeks following surgery. These exercises can be done on a training (exercise) mat, on the floor, on a table or on a bed. Use what ever works the best and is most comfortable for you Knee exercises include:  Leg Lifts - While your knee is still immobilized in a splint or cast, you can do straight leg raises. Lift the leg to 60 degrees, hold for 3 sec, and slowly lower the leg. Repeat 10-20 times 2-3 times daily. Perform this exercise against resistance later as your knee gets better.  Quad and Hamstring Sets - Tighten up the muscle on the front of the thigh (Quad) and hold for 5-10 sec. Repeat this 10-20 times hourly. Hamstring sets are done by pushing the foot backward against an object and holding for 5-10 sec. Repeat as with quad sets.  Leg Slides: Lying on your back, slowly slide your foot toward your buttocks, bending your knee up off the floor (only go as far as is comfortable). Then slowly slide your foot back down until your leg is flat on the floor again. Angel Wings: Lying on your back spread your legs to the side as far apart as you can without causing discomfort.  A rehabilitation program following serious knee injuries can speed recovery and prevent re-injury in the future due to weakened muscles. Contact your doctor or a physical therapist for more information on knee rehabilitation.   POST-OPERATIVE OPIOID TAPER INSTRUCTIONS: It is important to wean off of your opioid medication as soon as possible. If you do not need pain medication after your surgery it is ok to stop day one. Opioids include: Codeine , Hydrocodone(Norco, Vicodin), Oxycodone (Percocet, oxycontin ) and hydromorphone  amongst others.  Long term and even short term use of opiods can cause: Increased pain  response Dependence Constipation Depression Respiratory depression And more.  Withdrawal symptoms can include Flu like symptoms Nausea, vomiting And more Techniques to manage these symptoms Hydrate well Eat regular healthy meals Stay active Use relaxation techniques(deep breathing, meditating, yoga) Do Not substitute Alcohol to help with tapering If you have been on opioids for less than two weeks and do not have pain than it is ok to stop all together.  Plan to wean off of opioids This  plan should start within one week post op of your joint replacement. Maintain the same interval or time between taking each dose and first decrease the dose.  Cut the total daily intake of opioids by one tablet each day Next start to increase the time between doses. The last dose that should be eliminated is the evening dose.   IF YOU ARE TRANSFERRED TO A SKILLED REHAB FACILITY If the patient is transferred to a skilled rehab facility following release from the hospital, a list of the current medications will be sent to the facility for the patient to continue.  When discharged from the skilled rehab facility, please have the facility set up the patient's Home Health Physical Therapy prior to being released. Also, the skilled facility will be responsible for providing the patient with their medications at time of release from the facility to include their pain medication, the muscle relaxants, and their blood thinner medication. If the patient is still at the rehab facility at time of the two week follow up appointment, the skilled rehab facility will also need to assist the patient in arranging follow up appointment in our office and any transportation needs.  MAKE SURE YOU:  Understand these instructions.  Get help right away if you are not doing well or get worse.   DENTAL ANTIBIOTICS:  In most cases prophylactic antibiotics for Dental procdeures after total joint surgery are not  necessary.  Exceptions are as follows:  1. History of prior total joint infection  2. Severely immunocompromised (Organ Transplant, cancer chemotherapy, Rheumatoid biologic meds such as Humera)  3. Poorly controlled diabetes (A1C &gt; 8.0, blood glucose over 200)  If you have one of these conditions, contact your surgeon for an antibiotic prescription, prior to your dental procedure.    Pick up stool softner and laxative for home use following surgery while on pain medications. Do not submerge incision under water . Please use good hand washing techniques while changing dressing each day. May shower starting three days after surgery. Please use a clean towel to pat the incision dry following showers. Continue to use ice for pain and swelling after surgery. Do not use any lotions or creams on the incision until instructed by your surgeon.

## 2024-11-23 NOTE — Anesthesia Procedure Notes (Signed)
 Spinal  End time: 11/23/2024 10:17 AM  Staffing Authorized by: Keneth Lynwood POUR, MD   Performed by: Cena Epps, CRNA  Preanesthetic Checklist Completed: patient identified, IV checked, site marked, risks and benefits discussed, surgical consent, monitors and equipment checked, pre-op evaluation and timeout performed Spinal Block Patient position: sitting Prep: DuraPrep Patient monitoring: heart rate, cardiac monitor, continuous pulse ox and blood pressure Approach: midline Location: L3-4 Injection technique: single-shot Needle Needle type: Sprotte  Needle gauge: 24 G Needle length: 9 cm Assessment Sensory level: T4 Events: CSF return

## 2024-11-23 NOTE — Transfer of Care (Signed)
 Immediate Anesthesia Transfer of Care Note  Patient: Natalie Hall  Procedure(s) Performed: ARTHROPLASTY, KNEE, TOTAL (Right: Knee)  Patient Location: PACU  Anesthesia Type:MAC, Regional, and Spinal  Level of Consciousness: drowsy and patient cooperative  Airway & Oxygen Therapy: Patient Spontanous Breathing and Patient connected to face mask oxygen  Post-op Assessment: Report given to RN and Post -op Vital signs reviewed and stable  Post vital signs: Reviewed and stable  Last Vitals:  Vitals Value Taken Time  BP 105/53 11/23/24 11:54  Temp    Pulse 64 11/23/24 11:56  Resp 14 11/23/24 11:56  SpO2 100 % 11/23/24 11:56  Vitals shown include unfiled device data.  Last Pain:  Vitals:   11/23/24 0805  TempSrc: Oral  PainSc: 0-No pain         Complications: No notable events documented.

## 2024-11-24 ENCOUNTER — Other Ambulatory Visit (HOSPITAL_COMMUNITY): Payer: Self-pay

## 2024-11-24 ENCOUNTER — Encounter (HOSPITAL_COMMUNITY): Payer: Self-pay | Admitting: Orthopedic Surgery

## 2024-11-24 LAB — BASIC METABOLIC PANEL WITH GFR
Anion gap: 9 (ref 5–15)
BUN: 10 mg/dL (ref 8–23)
CO2: 24 mmol/L (ref 22–32)
Calcium: 8.3 mg/dL — ABNORMAL LOW (ref 8.9–10.3)
Chloride: 98 mmol/L (ref 98–111)
Creatinine, Ser: 0.57 mg/dL (ref 0.44–1.00)
GFR, Estimated: >60 mL/min
Glucose, Bld: 143 mg/dL — ABNORMAL HIGH (ref 70–99)
Potassium: 3.9 mmol/L (ref 3.5–5.1)
Sodium: 131 mmol/L — ABNORMAL LOW (ref 135–145)

## 2024-11-24 LAB — CBC
HCT: 30.6 % — ABNORMAL LOW (ref 36.0–46.0)
Hemoglobin: 10.2 g/dL — ABNORMAL LOW (ref 12.0–15.0)
MCH: 29.1 pg (ref 26.0–34.0)
MCHC: 33.3 g/dL (ref 30.0–36.0)
MCV: 87.4 fL (ref 80.0–100.0)
Platelets: 159 10*3/uL (ref 150–400)
RBC: 3.5 MIL/uL — ABNORMAL LOW (ref 3.87–5.11)
RDW: 14.1 % (ref 11.5–15.5)
WBC: 10.3 10*3/uL (ref 4.0–10.5)
nRBC: 0 % (ref 0.0–0.2)

## 2024-11-24 MED ORDER — METHOCARBAMOL 500 MG PO TABS
500.0000 mg | ORAL_TABLET | Freq: Four times a day (QID) | ORAL | 0 refills | Status: AC | PRN
  Filled 2024-11-24: qty 40, 10d supply, fill #0

## 2024-11-24 MED ORDER — ONDANSETRON HCL 4 MG PO TABS
4.0000 mg | ORAL_TABLET | Freq: Four times a day (QID) | ORAL | 0 refills | Status: AC | PRN
  Filled 2024-11-24: qty 20, 5d supply, fill #0

## 2024-11-24 MED ORDER — OXYCODONE HCL 5 MG PO TABS
5.0000 mg | ORAL_TABLET | ORAL | 0 refills | Status: AC | PRN
  Filled 2024-11-24: qty 42, 7d supply, fill #0

## 2024-11-24 MED ORDER — ASPIRIN 81 MG PO CHEW
81.0000 mg | CHEWABLE_TABLET | Freq: Two times a day (BID) | ORAL | 0 refills | Status: AC
  Filled 2024-11-24: qty 63, 42d supply, fill #0

## 2024-11-24 MED ORDER — TRAMADOL HCL 50 MG PO TABS
50.0000 mg | ORAL_TABLET | Freq: Four times a day (QID) | ORAL | 0 refills | Status: AC | PRN
  Filled 2024-11-24: qty 40, 5d supply, fill #0

## 2024-11-24 NOTE — Progress Notes (Signed)
 Discharge medications delivered to patient at the bedside in a secure bag.

## 2024-11-24 NOTE — Progress Notes (Signed)
 Physical Therapy Treatment Patient Details Name: Natalie Hall MRN: [id number] DOB: October 24, 1950 Today's Date: 11/24/2024   History of Present Illness 74 yo F s/p R TKA opn 11/23/24.  PMH includes: DM, CAD, CVA, Liver disease, COPD. s/p R TSA Reverse, L r TSA, CAD, NSTEMI, depression, HTN, PST, HH, ankle surgery, excision haglund's defomity, closed manipulation L TKA    PT Comments  Pt making steady progress this session. Amb ~ 41' with RW and CGA, distance to tolerance. . Pain controlled. Will see again in pm, pt will likely be ready to d/c later today   If plan is discharge home, recommend the following: A little help with walking and/or transfers;A little help with bathing/dressing/bathroom;Help with stairs or ramp for entrance;Assist for transportation;Assistance with cooking/housework   Can travel by private Automotive Engineer (2 wheels)    Recommendations for Other Services       Precautions / Restrictions Precautions Precautions: Fall;Knee Restrictions Weight Bearing Restrictions Per Provider Order: No Other Position/Activity Restrictions: WBAT     Mobility  Bed Mobility Overal bed mobility: Needs Assistance Bed Mobility: Supine to Sit     Supine to sit: Supervision     General bed mobility comments: for safety    Transfers Overall transfer level: Needs assistance Equipment used: Rolling walker (2 wheels) Transfers: Sit to/from Stand Sit to Stand: Contact guard assist, Supervision           General transfer comment: cues for hand placement and RLE position    Ambulation/Gait Ambulation/Gait assistance: Contact guard assist, Supervision Gait Distance (Feet): 60 Feet Assistive device: Rolling walker (2 wheels) Gait Pattern/deviations: Step-to pattern, Decreased stance time - right, Decreased weight shift to right       General Gait Details: cues for sequence, RW position   Stairs             Wheelchair  Mobility     Tilt Bed    Modified Rankin (Stroke Patients Only)       Balance           Standing balance support: During functional activity, Reliant on assistive device for balance Standing balance-Leahy Scale: Fair                              Hotel Manager: No apparent difficulties  Cognition Arousal: Alert Behavior During Therapy: WFL for tasks assessed/performed   PT - Cognitive impairments: No apparent impairments                         Following commands: Intact      Cueing Cueing Techniques: Verbal cues  Exercises Total Joint Exercises Ankle Circles/Pumps: AROM, Both, 5 reps    General Comments        Pertinent Vitals/Pain Pain Assessment Pain Assessment: 0-10 Pain Score: 5  Pain Location: right knee Pain Descriptors / Indicators: Sore, Aching, Grimacing, Guarding Pain Intervention(s): Limited activity within patient's tolerance, Monitored during session, Premedicated before session, Repositioned, Ice applied    Home Living                          Prior Function            PT Goals (current goals can now be found in the care plan section) Acute Rehab PT Goals Patient Stated Goal: less pain PT Goal  Formulation: With patient Time For Goal Achievement: 11/30/24 Potential to Achieve Goals: Good Progress towards PT goals: Progressing toward goals    Frequency    7X/week      PT Plan      Co-evaluation              AM-PAC PT 6 Clicks Mobility   Outcome Measure  Help needed turning from your back to your side while in a flat bed without using bedrails?: A Little Help needed moving from lying on your back to sitting on the side of a flat bed without using bedrails?: A Little Help needed moving to and from a bed to a chair (including a wheelchair)?: A Little Help needed standing up from a chair using your arms (e.g., wheelchair or bedside chair)?: A Little Help  needed to walk in hospital room?: A Little Help needed climbing 3-5 steps with a railing? : A Little 6 Click Score: 18    End of Session Equipment Utilized During Treatment: Gait belt Activity Tolerance: Patient tolerated treatment well Patient left: in chair;with call bell/phone within reach;with chair alarm set Nurse Communication: Mobility status PT Visit Diagnosis: Other abnormalities of gait and mobility (R26.89)     Time: 8943-8884 PT Time Calculation (min) (ACUTE ONLY): 19 min  Charges:    $Gait Training: 8-22 mins PT General Charges $$ ACUTE PT VISIT: 1 Visit                     Luretta Everly, PT  Acute Rehab Dept [facility]) [phone]  11/24/2024    [facility] 11/24/2024, 12:36 PM

## 2024-11-24 NOTE — Plan of Care (Signed)
  Problem: Pain Management: Goal: Pain level will decrease with appropriate interventions Outcome: Progressing   Problem: Clinical Measurements: Goal: Postoperative complications will be avoided or minimized Outcome: Progressing   Problem: Activity: Goal: Range of joint motion will improve Outcome: Progressing   Problem: Education: Goal: Knowledge of the prescribed therapeutic regimen will improve Outcome: Progressing   Problem: Safety: Goal: Ability to remain free from injury will improve Outcome: Progressing

## 2024-11-24 NOTE — Progress Notes (Signed)
 "  Subjective: 1 Day Post-Op Procedures (LRB): ARTHROPLASTY, KNEE, TOTAL (Right) Patient reports pain as mild.   Patient seen in rounds by Dr. Melodi. Patient is well, and has had no acute complaints or problems No issues overnight. Denies chest pain, SOB, or calf pain. Foley catheter removed this AM.  We will continue therapy today, ambulated 10' yesterday.   Objective: Vital signs in last 24 hours: Temp:  [97.4 F (36.3 C)-99 F (37.2 C)] 99 F (37.2 C) (02/03 0421) Pulse Rate:  [46-81] 79 (02/03 0421) Resp:  [14-24] 15 (02/03 0421) BP: (105-170)/(53-76) 146/70 (02/03 0421) SpO2:  [92 %-100 %] 97 % (02/03 0421)  Intake/Output from previous day:  Intake/Output Summary (Last 24 hours) at 11/24/2024 0841 Last data filed at 11/24/2024 0630 Gross per 24 hour  Intake 2764.14 ml  Output 3400 ml  Net -635.86 ml     Intake/Output this shift: No intake/output data recorded.  Labs: Recent Labs    11/24/24 0335  HGB 10.2*   Recent Labs    11/24/24 0335  WBC 10.3  RBC 3.50*  HCT 30.6*  PLT 159   Recent Labs    11/24/24 0335  NA 131*  K 3.9  CL 98  CO2 24  BUN 10  CREATININE 0.57  GLUCOSE 143*  CALCIUM  8.3*   No results for input(s): LABPT, INR in the last 72 hours.  Exam: General - Patient is Alert and Oriented Extremity - Neurologically intact Neurovascular intact Sensation intact distally Dorsiflexion/Plantar flexion intact Dressing - dressing C/D/I Motor Function - intact, moving foot and toes well on exam.   Past Medical History:  Diagnosis Date   Anxiety    Asthma    followe by pcp'  uncomplicated,  rescue inhaler prn   Chronic constipation    Chronic low back pain    Coronary artery disease 2017   cardiologist--- dr mona;   NSTEMI  w/ positive NUC 03-13-2016 in setting 8 hrs SVT;  cath 03-14-2016  w/ PCI and DES to mid to distal RCA,  normal LVEF   Fatty liver    followed by pcp  ;  in epic abd ultrasound 05-15-2023   GERD  (gastroesophageal reflux disease)    Hiatal hernia    History of non-ST elevation myocardial infarction (NSTEMI) 03/11/2016   Hx of adenomatous colonic polyps    Hyperlipidemia, mixed    Hypertension    Malignant melanoma of eye, left (HCC) 2017   followed by dr c. 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   Weight as of this encounter: 86.2 kg. Advance diet Up with therapy D/C IV fluids   Patient's anticipated LOS is less than 2 midnights, meeting these  requirements: - Lives within 1 hour of care - Has a competent adult at home to recover with post-op recover - NO history of  - Chronic pain requiring opioids  - Diabetes  - Coronary Artery Disease  - Heart failure  - Heart attack  - Stroke  - DVT/VTE  - Cardiac arrhythmia  - Respiratory Failure/COPD  - Renal failure  - Anemia  - Advanced Liver disease  DVT Prophylaxis - Aspirin  Weight bearing as tolerated. Continue therapy.  Plan is to go Home after hospital stay. Plan for discharge later today if progresses with therapy and meeting goals. Scheduled for OPPT at [facility]. Follow-up in the office in 2 weeks.  The PDMP database was reviewed today prior to any  opioid medications being prescribed to this patient.  Roxie Mess, PA-C Orthopedic Surgery [phone] 11/24/2024, 8:41 AM  "

## 2024-11-24 NOTE — TOC Transition Note (Addendum)
 Transition of Care [facility]) - Discharge Note   Patient Details  Name: Natalie Hall MRN: [id number] Date of Birth: Nov 22, 1950  Transition of [facility]) CM/SW Contact:  Alfonse JONELLE Rex, RN Phone Number: 11/24/2024, 10:44 AM   Clinical Narrative:   Met with patient at bedside to review dc therapy and home equipment, pt confirmed OPPT [facility], youth RW delivered to bedside  by [employer]. MOON completed. No INPT CM needs  -12:00pm Per PT, requesting change to regular RW. CM obtained RW  from [employer] equipment closet in PACU, delivered to patient's room.     Final next level of care: OP Rehab Barriers to Discharge: No Barriers Identified   Patient Goals and CMS Choice Patient states their goals for this hospitalization and ongoing recovery are:: return home          Discharge Placement                       Discharge Plan and Services Additional resources added to the After Visit Summary for                                       Social Drivers of Health (SDOH) Interventions SDOH Screenings   Food Insecurity: No Food Insecurity (11/23/2024)  Housing: Low Risk (11/23/2024)  Transportation Needs: No Transportation Needs (11/23/2024)  Utilities: Not At Risk (11/23/2024)  Alcohol Screen: Low Risk (12/21/2021)  Depression (PHQ2-9): Low Risk (01/21/2024)  Financial Resource Strain: Low Risk (08/02/2024)  Physical Activity: Inactive (08/02/2024)  Social Connections: Socially Integrated (11/23/2024)  Stress: Stress Concern Present (08/02/2024)  Tobacco Use: Low Risk (11/23/2024)     Readmission Risk Interventions     No data to display

## 2024-11-25 NOTE — Discharge Summary (Signed)
 Patient ID: Natalie Hall MRN: [id number] DOB/AGE: February 13, 1951 74 y.o.  Admit date: 11/23/2024 Discharge date: 11/24/2024  Admission Diagnoses:  Principal Problem:   OA (osteoarthritis) of knee Active Problems:   Primary osteoarthritis of right knee   Discharge Diagnoses:  Same  Past Medical History:  Diagnosis Date   Anxiety    Asthma    followe by pcp'  uncomplicated,  rescue inhaler prn   Chronic constipation    Chronic low back pain    Coronary artery disease 2017   cardiologist--- dr mona;   NSTEMI  w/ positive NUC 03-13-2016 in setting 8 hrs SVT;  cath 03-14-2016  w/ PCI and DES to mid to distal RCA,  normal LVEF   Fatty liver    followed by pcp  ;  in epic abd ultrasound 05-15-2023   GERD (gastroesophageal reflux disease)    Hiatal hernia    History of non-ST elevation myocardial infarction (NSTEMI) 03/11/2016   Hx of adenomatous colonic polyps    Hyperlipidemia, mixed    Hypertension    Malignant melanoma of eye, left (HCC) 2017   followed by dr c. 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 female who was admitted 11/23/2024 for operative treatment ofOA (osteoarthritis) of knee. Patient has severe unremitting pain that affects sleep, daily activities, and work/hobbies. After pre-op clearance the patient was taken to the operating  room on 11/23/2024 and underwent  Procedures: ARTHROPLASTY, KNEE, TOTAL.    Patient was given perioperative antibiotics:  Anti-infectives (From admission, onward)    Start     Dose/Rate Route Frequency Ordered Stop   11/23/24 1600  ceFAZolin  (ANCEF ) IVPB 2g/100 mL premix        2 g 200 mL/hr over 30 Minutes Intravenous Every 6 hours 11/23/24 1407 11/23/24 2317   11/23/24 0815  ceFAZolin  (ANCEF ) IVPB 2g/100 mL premix        2 g 200 mL/hr over 30 Minutes Intravenous On call to O.R. 11/23/24 9188 11/23/24 1019        Patient was given sequential compression devices, early ambulation, and chemoprophylaxis to prevent DVT.  Patient benefited maximally from hospital stay and there were no complications.    Recent vital signs: No data found.   Recent laboratory studies:  Recent Labs    11/24/24 0335  WBC 10.3  HGB 10.2*  HCT 30.6*  PLT 159  NA 131*  K 3.9  CL 98  CO2 24  BUN 10  CREATININE 0.57  GLUCOSE 143*  CALCIUM  8.3*     Discharge Medications:   Allergies as of 11/24/2024       Reactions   Codeine Shortness Of Breath, Rash, Other (See Comments)   Flushing, tolerates hydrocodone    Amoxicillin -pot Clavulanate Rash   Has patient had a PCN reaction causing immediate rash, facial/tongue/throat swelling,  SOB or lightheadedness with hypotension: No Has patient had a PCN reaction causing severe rash involving mucus membranes or skin necrosis: No Has patient had a PCN reaction that required hospitalization: No Has patient had a PCN reaction occurring within the last 10 years: Yes If all of the above answers are NO, then may proceed with Cephalosporin use.   Lexapro  [escitalopram ] Rash        Medication List     STOP taking these medications    aspirin  EC 81 MG tablet Replaced by: Aspirin  Low Dose 81 MG chewable tablet       TAKE these medications    acetaminophen  500 MG tablet Commonly known as: TYLENOL  Take 1 tablet (500 mg total) by mouth every 6 (six)  hours as needed (pain).   albuterol  108 (90 Base) MCG/ACT inhaler Commonly known as: VENTOLIN  HFA Inhale 2 puffs into the lungs every 4 (four) hours as needed for wheezing or shortness of breath.   alendronate  35 MG tablet Commonly known as: FOSAMAX  TAKE 1 TABLET BY MOUTH WEEKLY  WITH 8 OZ OF PLAIN WATER  30  MINUTES BEFORE FIRST FOOD, DRINK OR MEDS. STAY UPRIGHT FOR 30  MINS What changed: See the new instructions.   Aspirin  Low Dose 81 MG chewable tablet Generic drug: aspirin  Chew 1 tablet (81 mg total) by mouth 2 (two) times daily for 21 days. Then resume one 81 mg aspirin  once a day Replaces: aspirin  EC 81 MG tablet   Betasept  Surgical Scrub 4 % external liquid Generic drug: chlorhexidine  Apply 15 mLs (1 Application total) topically as directed for 30 doses. Use as directed daily for 5 days every other week for 6 weeks.   Calcium  Carb-Cholecalciferol  600-800 MG-UNIT Tabs Take 2 tablets by mouth in the morning.   cetirizine 10 MG tablet Commonly known as: ZYRTEC Take 10 mg by mouth daily as needed. PRN   estradiol  0.1 MG/GM vaginal cream Commonly known as: ESTRACE  Place 0.5g nightly two times a week What changed:  how much to take when to take this additional instructions   ezetimibe  10 MG tablet Commonly known as: ZETIA  TAKE 1 TABLET BY MOUTH DAILY   Magnesium 250 MG Tabs Take 250 mg by mouth in the morning.   metFORMIN  500 MG tablet Commonly known as: GLUCOPHAGE  Take 1 tablet (500 mg total) by mouth 2 (two) times daily with a meal.   methocarbamol  500 MG tablet Commonly known as: ROBAXIN  Take 1 tablet (500 mg total) by mouth every 6 (six) hours as needed for muscle spasms.   metoprolol  succinate 25 MG 24 hr tablet Commonly known as: TOPROL -XL TAKE 1 TABLET BY MOUTH TWICE  DAILY What changed: when to take this   multivitamin with minerals Tabs tablet Take 1 tablet by mouth daily. Centrum Silver for Adults 50+   mupirocin  ointment 2 % Commonly known as:  BACTROBAN  Place 1 Application into the nose 2 (two) times daily for 5 days every other week for 6 weeks.   Myrbetriq  50 MG Tb24 tablet Generic drug: mirabegron  ER TAKE 1 TABLET BY MOUTH DAILY   omeprazole  40 MG capsule Commonly known as: PRILOSEC TAKE 1 CAPSULE BY MOUTH DAILY What changed: when to take this   ondansetron  4 MG tablet Commonly known as: ZOFRAN  Take 1 tablet (4 mg total) by mouth every 6 (six) hours as needed for nausea.   oxyCODONE  5 MG immediate release tablet Commonly known as: Oxy IR/ROXICODONE  Take 1 tablet (5 mg total) by mouth every 4 (four) hours  as needed for severe pain (pain score 7-10).   ramipril  10 MG capsule Commonly known as: ALTACE  TAKE 1 CAPSULE BY MOUTH DAILY   rosuvastatin  40 MG tablet Commonly known as: CRESTOR  TAKE 1 TABLET BY MOUTH DAILY   Systane Ultra 0.4-0.3 % Soln Generic drug: Polyethyl Glycol-Propyl Glycol Place 1 drop into both eyes as needed (post eye injection (dryness)).   temazepam  15 MG capsule Commonly known as: RESTORIL  TAKE 1 CAPSULE BY MOUTH EVERY  NIGHT AT BEDTIME AS NEEDED FOR  SLEEP   traMADol  50 MG tablet Commonly known as: ULTRAM  Take 1-2 tablets (50-100 mg total) by mouth every 6 (six) hours as needed for moderate pain (pain score 4-6).   Vitamin D  50 MCG (2000 UT) Caps Take 1 capsule (2,000 Units total) by mouth daily.               Discharge Care Instructions  (From admission, onward)           Start     Ordered   11/24/24 0000  Weight bearing as tolerated        11/24/24 0845   11/24/24 0000  Change dressing       Comments: You may remove the bulky bandage (ACE wrap and gauze) two days after surgery. You will have an adhesive waterproof bandage underneath. Leave this in place until your first follow-up appointment.   11/24/24 0845            Diagnostic Studies: No results found.  Disposition: Discharge disposition: 01-Home or Self Care       Discharge Instructions     Call  MD / Call 911   Complete by: As directed    If you experience chest pain or shortness of breath, CALL 911 and be transported to the hospital emergency room.  If you develope a fever above 101 F, pus (white drainage) or increased drainage or redness at the wound, or calf pain, call your surgeon's office.   Change dressing   Complete by: As directed    You may remove the bulky bandage (ACE wrap and gauze) two days after surgery. You will have an adhesive waterproof bandage underneath. Leave this in place until your first follow-up appointment.   Constipation Prevention   Complete by: As directed    Drink plenty of fluids.  Prune juice may be helpful.  You may use a stool softener, such as Colace (over the counter) 100 mg twice a day.  Use MiraLax  (over the counter) for constipation as needed.   Diet - low sodium heart healthy   Complete by: As directed    Do not put a pillow under the knee. Place it under the heel.   Complete by: As directed    Driving restrictions   Complete by: As directed    No driving for two weeks   Post-operative opioid taper instructions:   Complete by: As directed    POST-OPERATIVE OPIOID TAPER INSTRUCTIONS: It is important to wean off of your opioid medication as soon as possible. If you do not need pain medication after your surgery it is ok to stop day one. Opioids include: Codeine, Hydrocodone (Norco, Vicodin), Oxycodone (Percocet, oxycontin ) and hydromorphone  amongst others.  Long term and even short term use of opiods can cause: Increased pain response Dependence Constipation Depression Respiratory depression And more.  Withdrawal symptoms can include Flu like symptoms Nausea, vomiting And more Techniques to manage these symptoms Hydrate well Eat regular healthy meals Stay active Use relaxation techniques(deep breathing,  meditating, yoga) Do Not substitute Alcohol to help with tapering If you have been on opioids for less than two weeks and do not  have pain than it is ok to stop all together.  Plan to wean off of opioids This plan should start within one week post op of your joint replacement. Maintain the same interval or time between taking each dose and first decrease the dose.  Cut the total daily intake of opioids by one tablet each day Next start to increase the time between doses. The last dose that should be eliminated is the evening dose.      TED hose   Complete by: As directed    Use stockings (TED hose) for three weeks on both leg(s).  You may remove them at night for sleeping.   Weight bearing as tolerated   Complete by: As directed         Follow-up Information     Aluisio, Dempsey, MD Follow up in 2 week(s).   Specialty: Orthopedic Surgery Contact information: [address] [place] [address] [phone]                  Signed: Roxie Mess 11/25/2024, 10:09 AM

## 2025-01-14 ENCOUNTER — Ambulatory Visit: Admitting: Internal Medicine

## 2025-01-15 ENCOUNTER — Ambulatory Visit: Admitting: Internal Medicine
